# Patient Record
Sex: Female | Born: 1935
Health system: Southern US, Community
[De-identification: ages and names within clinical notes are randomized; demographics above are authoritative.]

## PROBLEM LIST (undated history)

## (undated) DIAGNOSIS — T8859XA Other complications of anesthesia, initial encounter: Secondary | ICD-10-CM

## (undated) DIAGNOSIS — T4145XA Adverse effect of unspecified anesthetic, initial encounter: Secondary | ICD-10-CM

## (undated) DIAGNOSIS — Z9889 Other specified postprocedural states: Secondary | ICD-10-CM

## (undated) DIAGNOSIS — I10 Essential (primary) hypertension: Secondary | ICD-10-CM

## (undated) DIAGNOSIS — J9819 Other pulmonary collapse: Secondary | ICD-10-CM

## (undated) DIAGNOSIS — I209 Angina pectoris, unspecified: Secondary | ICD-10-CM

## (undated) DIAGNOSIS — D869 Sarcoidosis, unspecified: Secondary | ICD-10-CM

## (undated) DIAGNOSIS — E039 Hypothyroidism, unspecified: Secondary | ICD-10-CM

## (undated) DIAGNOSIS — I82409 Acute embolism and thrombosis of unspecified deep veins of unspecified lower extremity: Secondary | ICD-10-CM

## (undated) DIAGNOSIS — I739 Peripheral vascular disease, unspecified: Secondary | ICD-10-CM

## (undated) DIAGNOSIS — S43006A Unspecified dislocation of unspecified shoulder joint, initial encounter: Secondary | ICD-10-CM

## (undated) DIAGNOSIS — M1990 Unspecified osteoarthritis, unspecified site: Secondary | ICD-10-CM

## (undated) DIAGNOSIS — R112 Nausea with vomiting, unspecified: Secondary | ICD-10-CM

## (undated) DIAGNOSIS — F419 Anxiety disorder, unspecified: Secondary | ICD-10-CM

## (undated) DIAGNOSIS — K219 Gastro-esophageal reflux disease without esophagitis: Secondary | ICD-10-CM

## (undated) DIAGNOSIS — D860 Sarcoidosis of lung: Secondary | ICD-10-CM

## (undated) DIAGNOSIS — E7800 Pure hypercholesterolemia, unspecified: Secondary | ICD-10-CM

## (undated) DIAGNOSIS — C801 Malignant (primary) neoplasm, unspecified: Secondary | ICD-10-CM

## (undated) DIAGNOSIS — M47812 Spondylosis without myelopathy or radiculopathy, cervical region: Secondary | ICD-10-CM

## (undated) DIAGNOSIS — R519 Headache, unspecified: Secondary | ICD-10-CM

## (undated) DIAGNOSIS — R51 Headache: Secondary | ICD-10-CM

## (undated) HISTORY — DX: Other pulmonary collapse: J98.19

## (undated) HISTORY — DX: Spondylosis without myelopathy or radiculopathy, cervical region: M47.812

## (undated) HISTORY — DX: Unspecified osteoarthritis, unspecified site: M19.90

## (undated) HISTORY — PX: JOINT REPLACEMENT: SHX530

## (undated) HISTORY — PX: INTRAOCULAR LENS INSERTION: SHX110

## (undated) HISTORY — DX: Unspecified dislocation of unspecified shoulder joint, initial encounter: S43.006A

## (undated) HISTORY — PX: EYE SURGERY: SHX253

## (undated) HISTORY — DX: Acute embolism and thrombosis of unspecified deep veins of unspecified lower extremity: I82.409

## (undated) HISTORY — DX: Essential (primary) hypertension: I10

## (undated) HISTORY — DX: Pure hypercholesterolemia, unspecified: E78.00

---

## 1972-12-09 HISTORY — PX: HEMORRHOID SURGERY: SHX153

## 1998-01-05 ENCOUNTER — Other Ambulatory Visit: Admission: RE | Admit: 1998-01-05 | Discharge: 1998-01-05 | Payer: Self-pay | Admitting: Gynecology

## 1999-01-09 ENCOUNTER — Other Ambulatory Visit: Admission: RE | Admit: 1999-01-09 | Discharge: 1999-01-09 | Payer: Self-pay | Admitting: Gynecology

## 2000-01-28 ENCOUNTER — Other Ambulatory Visit: Admission: RE | Admit: 2000-01-28 | Discharge: 2000-01-28 | Payer: Self-pay | Admitting: Gynecology

## 2001-03-02 ENCOUNTER — Other Ambulatory Visit: Admission: RE | Admit: 2001-03-02 | Discharge: 2001-03-02 | Payer: Self-pay | Admitting: Internal Medicine

## 2002-03-05 ENCOUNTER — Emergency Department (HOSPITAL_COMMUNITY): Admission: EM | Admit: 2002-03-05 | Discharge: 2002-03-05 | Payer: Self-pay | Admitting: Emergency Medicine

## 2002-03-16 ENCOUNTER — Other Ambulatory Visit: Admission: RE | Admit: 2002-03-16 | Discharge: 2002-03-16 | Payer: Self-pay | Admitting: Gynecology

## 2003-03-24 ENCOUNTER — Other Ambulatory Visit: Admission: RE | Admit: 2003-03-24 | Discharge: 2003-03-24 | Payer: Self-pay | Admitting: Gynecology

## 2004-08-11 DIAGNOSIS — I739 Peripheral vascular disease, unspecified: Secondary | ICD-10-CM

## 2004-08-11 DIAGNOSIS — I82409 Acute embolism and thrombosis of unspecified deep veins of unspecified lower extremity: Secondary | ICD-10-CM

## 2004-08-11 HISTORY — DX: Peripheral vascular disease, unspecified: I73.9

## 2004-08-11 HISTORY — DX: Acute embolism and thrombosis of unspecified deep veins of unspecified lower extremity: I82.409

## 2004-10-10 ENCOUNTER — Other Ambulatory Visit: Admission: RE | Admit: 2004-10-10 | Discharge: 2004-10-10 | Payer: Self-pay | Admitting: Gynecology

## 2004-10-23 ENCOUNTER — Encounter: Admission: RE | Admit: 2004-10-23 | Discharge: 2004-11-28 | Payer: Self-pay | Admitting: Internal Medicine

## 2005-06-11 HISTORY — PX: FOOT SURGERY: SHX648

## 2005-07-08 ENCOUNTER — Ambulatory Visit (HOSPITAL_COMMUNITY): Admission: RE | Admit: 2005-07-08 | Discharge: 2005-07-08 | Payer: Self-pay | Admitting: Nurse Practitioner

## 2005-07-08 ENCOUNTER — Ambulatory Visit (HOSPITAL_BASED_OUTPATIENT_CLINIC_OR_DEPARTMENT_OTHER): Admission: RE | Admit: 2005-07-08 | Discharge: 2005-07-08 | Payer: Self-pay | Admitting: Nurse Practitioner

## 2005-07-19 ENCOUNTER — Emergency Department (HOSPITAL_COMMUNITY): Admission: EM | Admit: 2005-07-19 | Discharge: 2005-07-19 | Payer: Self-pay | Admitting: Emergency Medicine

## 2006-01-08 ENCOUNTER — Emergency Department (HOSPITAL_COMMUNITY): Admission: EM | Admit: 2006-01-08 | Discharge: 2006-01-08 | Payer: Self-pay | Admitting: Emergency Medicine

## 2006-01-09 ENCOUNTER — Emergency Department (HOSPITAL_COMMUNITY): Admission: EM | Admit: 2006-01-09 | Discharge: 2006-01-09 | Payer: Self-pay | Admitting: Emergency Medicine

## 2006-01-11 ENCOUNTER — Emergency Department (HOSPITAL_COMMUNITY): Admission: EM | Admit: 2006-01-11 | Discharge: 2006-01-12 | Payer: Self-pay | Admitting: Emergency Medicine

## 2006-01-12 ENCOUNTER — Ambulatory Visit (HOSPITAL_COMMUNITY): Admission: RE | Admit: 2006-01-12 | Discharge: 2006-01-12 | Payer: Self-pay | Admitting: Internal Medicine

## 2007-01-10 HISTORY — PX: SKIN BIOPSY: SHX1

## 2007-03-16 ENCOUNTER — Other Ambulatory Visit: Admission: RE | Admit: 2007-03-16 | Discharge: 2007-03-16 | Payer: Self-pay | Admitting: Gynecology

## 2007-05-14 ENCOUNTER — Encounter: Admission: RE | Admit: 2007-05-14 | Discharge: 2007-05-14 | Payer: Self-pay | Admitting: Orthopedic Surgery

## 2007-06-12 HISTORY — PX: TOTAL HIP ARTHROPLASTY: SHX124

## 2007-06-28 ENCOUNTER — Inpatient Hospital Stay (HOSPITAL_COMMUNITY): Admission: RE | Admit: 2007-06-28 | Discharge: 2007-07-01 | Payer: Self-pay | Admitting: Orthopedic Surgery

## 2007-10-10 HISTORY — PX: TOTAL KNEE ARTHROPLASTY: SHX125

## 2007-10-18 ENCOUNTER — Inpatient Hospital Stay (HOSPITAL_COMMUNITY): Admission: RE | Admit: 2007-10-18 | Discharge: 2007-10-21 | Payer: Self-pay | Admitting: Orthopedic Surgery

## 2008-04-25 ENCOUNTER — Other Ambulatory Visit: Admission: RE | Admit: 2008-04-25 | Discharge: 2008-04-25 | Payer: Self-pay | Admitting: Gynecology

## 2008-04-25 ENCOUNTER — Ambulatory Visit: Payer: Self-pay | Admitting: Gynecology

## 2008-04-25 ENCOUNTER — Encounter: Payer: Self-pay | Admitting: Gynecology

## 2008-04-27 ENCOUNTER — Ambulatory Visit: Payer: Self-pay | Admitting: Gynecology

## 2008-07-11 ENCOUNTER — Emergency Department (HOSPITAL_COMMUNITY): Admission: EM | Admit: 2008-07-11 | Discharge: 2008-07-11 | Payer: Self-pay | Admitting: Emergency Medicine

## 2008-07-28 ENCOUNTER — Encounter: Payer: Self-pay | Admitting: Cardiovascular Disease

## 2009-02-05 ENCOUNTER — Ambulatory Visit: Payer: Self-pay | Admitting: Internal Medicine

## 2009-02-13 ENCOUNTER — Telehealth: Payer: Self-pay | Admitting: Internal Medicine

## 2009-02-19 ENCOUNTER — Ambulatory Visit: Payer: Self-pay | Admitting: Internal Medicine

## 2009-05-08 ENCOUNTER — Ambulatory Visit: Payer: Self-pay | Admitting: Gynecology

## 2009-05-08 ENCOUNTER — Encounter: Payer: Self-pay | Admitting: Gynecology

## 2009-05-08 ENCOUNTER — Other Ambulatory Visit: Admission: RE | Admit: 2009-05-08 | Discharge: 2009-05-08 | Payer: Self-pay | Admitting: Gynecology

## 2009-05-11 ENCOUNTER — Ambulatory Visit: Payer: Self-pay | Admitting: Gynecology

## 2009-07-02 DIAGNOSIS — M1990 Unspecified osteoarthritis, unspecified site: Secondary | ICD-10-CM | POA: Insufficient documentation

## 2010-05-19 ENCOUNTER — Emergency Department (HOSPITAL_COMMUNITY)
Admission: EM | Admit: 2010-05-19 | Discharge: 2010-05-19 | Payer: Self-pay | Source: Home / Self Care | Admitting: Emergency Medicine

## 2010-07-11 DIAGNOSIS — J9819 Other pulmonary collapse: Secondary | ICD-10-CM

## 2010-07-11 HISTORY — DX: Other pulmonary collapse: J98.19

## 2010-07-19 ENCOUNTER — Ambulatory Visit (HOSPITAL_COMMUNITY)
Admission: RE | Admit: 2010-07-19 | Discharge: 2010-07-19 | Payer: Self-pay | Source: Home / Self Care | Attending: Orthopedic Surgery | Admitting: Orthopedic Surgery

## 2010-07-23 ENCOUNTER — Encounter
Admission: RE | Admit: 2010-07-23 | Discharge: 2010-07-23 | Payer: Self-pay | Source: Home / Self Care | Attending: Orthopedic Surgery | Admitting: Orthopedic Surgery

## 2010-07-29 ENCOUNTER — Ambulatory Visit: Payer: Self-pay | Admitting: Internal Medicine

## 2010-07-30 ENCOUNTER — Ambulatory Visit: Payer: Self-pay | Admitting: Internal Medicine

## 2010-07-30 ENCOUNTER — Telehealth: Payer: Self-pay | Admitting: Internal Medicine

## 2010-07-30 DIAGNOSIS — I10 Essential (primary) hypertension: Secondary | ICD-10-CM

## 2010-07-30 DIAGNOSIS — J984 Other disorders of lung: Secondary | ICD-10-CM

## 2010-07-30 DIAGNOSIS — E785 Hyperlipidemia, unspecified: Secondary | ICD-10-CM

## 2010-07-30 DIAGNOSIS — R05 Cough: Secondary | ICD-10-CM

## 2010-07-30 DIAGNOSIS — J9819 Other pulmonary collapse: Secondary | ICD-10-CM | POA: Insufficient documentation

## 2010-07-30 DIAGNOSIS — R059 Cough, unspecified: Secondary | ICD-10-CM

## 2010-07-30 DIAGNOSIS — I80299 Phlebitis and thrombophlebitis of other deep vessels of unspecified lower extremity: Secondary | ICD-10-CM | POA: Insufficient documentation

## 2010-07-30 HISTORY — DX: Other pulmonary collapse: J98.19

## 2010-07-30 HISTORY — DX: Hyperlipidemia, unspecified: E78.5

## 2010-07-30 HISTORY — DX: Cough, unspecified: R05.9

## 2010-07-30 HISTORY — DX: Phlebitis and thrombophlebitis of other deep vessels of unspecified lower extremity: I80.299

## 2010-07-30 HISTORY — DX: Other disorders of lung: J98.4

## 2010-07-30 HISTORY — DX: Essential (primary) hypertension: I10

## 2010-07-31 ENCOUNTER — Telehealth: Payer: Self-pay | Admitting: Internal Medicine

## 2010-08-01 ENCOUNTER — Ambulatory Visit (HOSPITAL_COMMUNITY)
Admission: RE | Admit: 2010-08-01 | Discharge: 2010-08-01 | Payer: Self-pay | Source: Home / Self Care | Attending: Internal Medicine | Admitting: Internal Medicine

## 2010-08-06 ENCOUNTER — Telehealth (INDEPENDENT_AMBULATORY_CARE_PROVIDER_SITE_OTHER): Payer: Self-pay | Admitting: *Deleted

## 2010-08-14 ENCOUNTER — Telehealth (INDEPENDENT_AMBULATORY_CARE_PROVIDER_SITE_OTHER): Payer: Self-pay | Admitting: *Deleted

## 2010-09-04 ENCOUNTER — Ambulatory Visit
Admission: RE | Admit: 2010-09-04 | Discharge: 2010-09-04 | Payer: Self-pay | Source: Home / Self Care | Attending: Internal Medicine | Admitting: Internal Medicine

## 2010-09-06 ENCOUNTER — Telehealth: Payer: Self-pay | Admitting: Internal Medicine

## 2010-09-06 ENCOUNTER — Telehealth (INDEPENDENT_AMBULATORY_CARE_PROVIDER_SITE_OTHER): Payer: Self-pay | Admitting: *Deleted

## 2010-09-12 ENCOUNTER — Telehealth (INDEPENDENT_AMBULATORY_CARE_PROVIDER_SITE_OTHER): Payer: Self-pay | Admitting: *Deleted

## 2010-09-12 NOTE — Progress Notes (Signed)
Summary: uncontrolable cough and results from bronoscopy  Phone Note Call from Patient Call back at Home Phone [phone]   Caller: Patient Call For: Dr. Marchelle Gearing Summary of Call: Patient stated that she had a Bronoscopy Thursday afternoon and she has alot of uncontrolable coughing especailly at night since she had the Bronocsopy. Patient would like to know if she needs to be seen by someone. Her cough is not productive. She can be reached at [phone] Patient also would like the results of her pathology from the procedure Initial call taken by: Vedia Coffer,  August 06, 2010 10:44 AM  Follow-up for Phone Call        Spoke with pt and she states since bronch she has had some wheezing and increased dry cough. SHe is also hoarse. She states the cough is much worse at night. Pt has denied any SOB. Please advise. Carron Curie CMA  August 06, 2010 2:39 PM   Additional Follow-up for Phone Call Additional follow up Details #1::        Spoke with patient-she is aware that CDY suggest she follow up with MR since having bronch-will forward message to [facility] and MR to discuss this; also patient aware we are sending Tessalon pearls 100mg  #20 take 1-2 three times a day as needed cough no refills per CDY.Reynaldo Minium CMA  August 06, 2010 4:29 PM     Additional Follow-up for Phone Call Additional follow up Details #2::    [place] f[place]. Explained results show acute inflammation (neutrophils in BAL) and entrance to middle lobe is very narrowed. Micro is negative. Recommend a) cephalexing 300mg  by mouth two times a day x 7 days, and b) prednisone 40mg  by mouth daily x 5 days, then 20mg  daily x 5 days, then 10mg  x 5 days, then 5mg  daily x 5 days and sthen stop; c) ROV with me in 4 weeks. She should call sooner if cough is getting worse or not responding to above. Also explained if above not helping surgical option to be considered.   TRiage:I would appreciate it if you  could call in the meds and set up followup and inform patinet med has been called   Follow-up by: [name] MD,  August 07, 2010 8:37 AM  Additional Follow-up for Phone Call Additional follow up Details #3:: Details for Additional Follow-up Action Taken: [facility] Vernie Murders  August 07, 2010 8:54 AM  Spoke with pt and advised that we will call in the above meds to [facility] [address].  Pt verbalized understanding.  4 wk rov sched for 09/04/10 at 1:30 pm. Additional Follow-up by: Vernie Murders,  August 07, 2010 9:07 AM  New/Updated Medications: TESSALON PERLES 100 MG CAPS (BENZONATATE) take 1-2 by mouth three times a day as needed cough CEPHALEXIN 250 MG CAPS (CEPHALEXIN) 1 two times a day until gone PREDNISONE 10 MG TABS (PREDNISONE) 4 each am x 5 days, 2 each am x 5 days, 1 each am x 5 days, then 1/2 each am x 5 days- then stop Prescriptions: PREDNISONE 10 MG TABS (PREDNISONE) 4 each am x 5 days, 2 each am x 5 days, 1 each am x 5 days, then 1/2 each am x 5 days- then stop  #34 x 0   Entered by:   Vernie Murders   Authorized by:   [name] MD   Signed by:   Vernie Murders on 08/07/2010   Method used:   [facility] to  [facility]  [phone][facility] (retail)       [address] [place], [place]  [address]       Ph: [phone] or [phone]       Fax: [phone]   RxID:   [id number] CEPHALEXIN 250 MG CAPS (CEPHALEXIN) 1 two times a day until gone  #14 x 0   Entered by:   Vernie Murders   Authorized by:   [name] MD   Signed by:   Vernie Murders on 08/07/2010   Method used:   Electronically to        [facility]  [phone]* (retail)       [address] [place], [place]  [address]       Ph: [phone] or [phone]       Fax: [phone]   RxID:   [id number] TESSALON PERLES 100 MG CAPS (BENZONATATE) take 1-2 by mouth three times a day as needed cough  #20 x 0   Entered by:   Reynaldo Minium CMA   Authorized by:   Waymon Budge MD   Signed by:   Reynaldo Minium CMA on 08/06/2010   Method used:   Electronically to        [facility]  [phone]* (retail)       [address] [place], [place]  [address]       Ph: [phone] or [phone]       Fax: [phone]   RxID:   [id number]

## 2010-09-12 NOTE — Progress Notes (Signed)
Summary: xray results - duplicate note  Phone Note Call from Patient Call back at Home Phone [phone]   Caller: Patient Call For: ramaswamy Reason for Call: Lab or Test Results Summary of Call: Requests results of her cxr. Initial call taken by: Darletta Moll,  September 06, 2010 2:49 PM  Follow-up for Phone Call        duplicate phone message taken on this.  Pls see phone note dated for earlier today regarding this.   Follow-up by: Gweneth Dimitri RN,  September 06, 2010 4:13 PM

## 2010-09-12 NOTE — Progress Notes (Signed)
Summary: Benicar 20mg  samples  Phone Note Call from Patient Call back at Home Phone [phone]   Caller: Patient Call For: Dr. Marchelle Gearing Summary of Call: patient phoned stated that Dr. Marchelle Gearing changed her BP medication to Benicar 20 MG she only has 8 tablets left and doesn't see him for 21 days. She is doing well with this medication and would like samples to last until her appointment or [facility] [address]. Patient can be reached [phone] Patient is going to take her walk will return in about one hour if she is out you can leave a message Initial call taken by: Vedia Coffer,  August 14, 2010 9:14 AM  Follow-up for Phone Call        3 boxes of Benicar 20mg  ready for patient to pick up. Pt aware.Michel Bickers CMA  August 14, 2010 11:03 AM

## 2010-09-12 NOTE — Progress Notes (Addendum)
Summary: results -- PT CALLED AGAIN  Phone Note Call from Patient Call back at Home Phone [phone]   Caller: Patient Call For: Deidre Carino Reason for Call: Lab or Test Results Summary of Call: Patient requesting xray results. Initial call taken by: Lehman Prom,  September 06, 2010 9:25 AM  Follow-up for Phone Call        pt requesting cxr results from 09/04/2010. They are unsigned.  Please advise. Aundra Millet Reynolds LPN  September 06, 2010 10:27 AM   MR, pt has called back again requesting these results.  Pls advise.  Thanks!  Additional Follow-up for Phone Call Additional follow up Details #1::        a) gave results. explained collapse likely to persist and cause recurrent pna. Options are to a) expectant fu; b) enquire with [facility] if they can stent it and get 2nd opinion; c) lobectomy. SHe is interested in B and I ahve written to Dr Carmie Kanner at [facility]. Depending on that I might send her to general pulmonary for 2nd opinion  b regarding BP she is started losartan-hctz. SHe is ok with it but want to try ACE inhibitor. I said ok if she wants to go back on ACEand see what happens to cough Additional Follow-up by: [name] MD,  September 06, 2010 5:49 PM     Appended Document: results -- PT CALLED AGAIN I d/w Dr. Waldon Merl [facility] interventional program by email. He thinks dilatation might help. He is willing to see patient and discuss. D/w patient and husband and they want to meet with him to see what options are. Candise Bowens, pls arrange for referral but contacting hiis nurse/coordinator - Ardelia Mems to give her the information. Her office number is: [phone].  pls give fu with me 1-2 months after she sees him Pls let me knwo what date she sees him  Thanks MR  Appended Document: [facility] referral order    Clinical Lists Changes  Orders: Added new Referral order of Misc. Referral (Misc. Ref) - Signed

## 2010-09-12 NOTE — Progress Notes (Signed)
Summary: bronch 08/01/2010  Phone Note Outgoing Call   Summary of Call: spoke to patient. CXR May 2010 was clear. CXR May 2011 shows mild RML plate like atelectasis. This is worse now on recent CXR. She needs bronch. I d/w her on phone. Explained risks of bronch - bleeidng, ptx, sedation complicatina nd non diagnosis. BEnefit of finding diagnosis potentially explauiend. She agrees. Bronch 08/01/2010 at 13:00h at [facility] scheduled. Plan for airway exam, RML lavage, brush bx +/- endobronchial bx Initial call taken by: [name] MD,  July 31, 2010 1:20 PM

## 2010-09-12 NOTE — Assessment & Plan Note (Signed)
Summary: LUNG NODULES///KP   Visit Type:  Initial Consult Copy to:  Dr. Lequita Halt Primary Nile Prisk/Referring Damany Eastman:  Dr. Geoffry Paradise, Dr. Marchelle Folks, Dr. Christel Mormon, Dr. Lonia Skinner, Dr.,Philip Nasher-cards  CC:  Pulmonary Consult for abnormal CT. Marland Kitchen  History of Present Illness: July 30, 2010: 77 year old white female who is generall active at gym and exercises. Referred by Dr. Despina Hick for abnormal imaging. She is s/p left hip replacement inNov 2009 but currently needs bursectomy versus ligament repair. So, she has preop CXR 07/19/2010 which showed RML atelectasis with associated soft tisse fullness. REsulted in CT chest 07/23/2010 which showed RML partial collapse with surrounding fullness, 7mm Left apical nodule image 11, LUL 6mm nodule image 10, soft tissue fullness at right hilum and borderline hilar, subcarinal nodes. Ther is one calcified nodule  In terms of chest symptoms: In April 2011 she went to [place]. ST[place] on way back picked up a "nasty cold" in plane. Rx with 2 rounds of prednisone and 1 round of abx and it cleared up. In June 2011 she had routine cxr at Dr. Jacky Kindle office - she believes it might have had some abnormality NOS. Then, i In first part of Nov 2011 was in [place]. Soon after return on 06/27/2010 she noticed dry irritating cough that has persisted. Cough present only at day time. Relieved by throat lozenges. Denies associated  sinus drainage but admits to ACE inhibitor intake since 2007. Also admits to mild GERD for which she is take occassional zantac (gerd made worse by chocolates and spices). Denies dyspnea, wheezing, sputum, fever, chills, weight loss. In terms of travel, she was born in [place]. Spent lot of time of [place] and [place]. Moved to [place] 20 years ago.    Preventive Screening-Counseling & Management  Alcohol-Tobacco     Smoking Status: never     Passive Smoke Exposure: yes  Comments: passive smoke - birth to age 77 via  dad  Current Medications (verified): 1)  Gemfibrozil 600 Mg Tabs (Gemfibrozil) .... Take 1 Tablet By Mouth Two Times A Day 2)  Lotrel 10-20 Mg Caps (Amlodipine Besy-Benazepril Hcl) .... Take 1 Tablet By Mouth Once A Day 3)  Advil 200 Mg Tabs (Ibuprofen) .... As Needed 4)  Calcium 600 Mg Tabs (Calcium) .... 2 Tablets Daily 5)  Multivitamins  Tabs (Multiple Vitamin) .... Take 1 Tablet By Mouth Once A Day 6)  Aspir-Low 81 Mg Tbec (Aspirin) .... Take 1 Tablet By Mouth Once A Day 7)  Vitamin D 1000 Unit Tabs (Cholecalciferol) .... Take 1 Tablet By Mouth Once A Day  Allergies (verified): No Known Drug Allergies  Past History:  Past Medical History: Hyperlipidemia Hypertension Deep Vein Thrombosis/Phlebitis, 61yrs ago with birth of son, and again in  2006  - 1966 "calf of left leg"  following labor in [place], [place] in hospital extra week. No Rx.   - 2006 was "deep vein" followng foot surgery - took coumadin for 6 months  - not on life long anticoagulation Left hip pain  - repeated cortisone shots  Stress Test 12-09-Dr. Melburn Popper normal  Past Surgical History: Total left hip replacement 11-08,  total Knee, right replacement 3-09 Lens replacement-both 05-2005 Hemorrhoidectomy-12-1972 Left foot surgery -06-2005 Basal cell carcinoma biopsied and removed, RL leg 01-2007  Family History: Mother-heart disease Father-heart disease  Social History: Patient never smoked.  Married Retired Curator at [employer] One beer dailySmoking Status:  never Passive Smoke Exposure:  yes  Review of Systems  The patient complains of non-productive cough and headaches.  The patient denies shortness of breath with activity, shortness of breath at rest, productive cough, coughing up blood, chest pain, irregular heartbeats, acid heartburn, indigestion, loss of appetite, weight change, abdominal pain, difficulty swallowing, sore throat, tooth/dental problems, nasal congestion/difficulty  breathing through nose, sneezing, itching, ear ache, anxiety, depression, hand/feet swelling, joint stiffness or pain, rash, change in color of mucus, and fever.    Vital Signs:  Patient profile:   77 year old female Height:      64 inches Weight:      150.38 pounds BMI:     25.91 O2 Sat:      99 % on Room air Temp:     98.0 degrees F oral Pulse rate:   73 / minute BP sitting:   128 / 72  (right arm) Cuff size:   regular  Vitals Entered By: Carron Curie CMA (July 30, 2010 9:14 AM)  O2 Flow:  Room air CC: Pulmonary Consult for abnormal CT.  Comments Medications reviewed with patient Carron Curie CMA  July 30, 2010 9:16 AM Daytime phone number verified with patient.    Physical Exam  General:  well developed, well nourished, in no acute distress Head:  normocephalic and atraumatic Eyes:  PERRLA/EOM intact; conjunctiva and sclera clear Ears:  TMs intact and clear with normal canals Nose:  no deformity, discharge, inflammation, or lesions Mouth:  no deformity or lesions Neck:  no masses, thyromegaly, or abnormal cervical nodes Chest Wall:  no deformities noted Lungs:  clear bilaterally to auscultation and percussion Heart:  regular rate and rhythm, S1, S2 without murmurs, rubs, gallops, or clicks Abdomen:  bowel sounds positive; abdomen soft and non-tender without masses, or organomegaly Msk:  no deformity or scoliosis noted with normal posture Pulses:  pulses normal Extremities:  no clubbing, cyanosis, edema, or deformity noted Neurologic:  CN II-XII grossly intact with normal reflexes, coordination, muscle strength and tone Skin:  intact without lesions or rashes Cervical Nodes:  no significant adenopathy Axillary Nodes:  no significant adenopathy Psych:  alert and cooperative; normal mood and affect; normal attention span and concentration   CT of Chest  Procedure date:  07/23/2010  Findings:       CT chest 07/23/2010 which showed RML partial  collapse with surrounding fullness, 7mm Left apical nodule image 11, LUL 6mm nodule image 10, soft tissue fullness at right hilum and borderline hilar, subcarinal nodes. Ther is one calcified nodule  Comments:      independently reviewed  Impression & Recommendations:  Problem # 1:  PULMONARY COLLAPSE (ICD-518.0) Assessment New  There is RML collpase on CXR and CT Dec 2011. Unclear if this is new or chronic. If new, likely related to mucus plug/pna from [place] trip in April 2011 with associated nodal enlargement. IF old, suspect remote foreign body aspiration, MAC, RML collapse syndrome. Have instructed her to get her old hard copy cxr from PMD office. I wil reivew those. If indeed these are new, will get PET scan due to descripton of fullness around RML entrance. She will then need Bronch +/- EBUS. Hold ortho surgery till then  Orders: Consultation Level V [phone])  Problem # 2:  PULMONARY NODULE (ICD-518.89) Assessment: New  These are less than 8mm and likely benign due to nonsmoking and living in [place].  plan needs repeat ct chest 6-9 months indepentn of collapse issue  Orders: Consultation Level V ([id number])  Problem # 3:  COUGH (ICD-786.2) Assessment: New  probably related to mid-lobe collapse but GERD and ACE inhibitor could be weighing. Will address these at followup dependingon course of RML collapse workup  Orders: Consultation Level V ([id number])  Problem # 4:  Hx of DEEP VEIN THROMBOSIS/PHLEBITIS (ICD-451.19) Assessment: New  Medications Added to Medication List This Visit: 1)  Gemfibrozil 600 Mg Tabs (Gemfibrozil) .... Take 1 tablet by mouth two times a day 2)  Lotrel 10-20 Mg Caps (Amlodipine besy-benazepril hcl) .... Take 1 tablet by mouth once a day 3)  Advil 200 Mg Tabs (Ibuprofen) .... As needed 4)  Calcium 600 Mg Tabs (Calcium) .... 2 tablets daily 5)  Multivitamins Tabs (Multiple vitamin) .... Take 1 tablet by mouth once a day 6)  Aspir-low 81 Mg Tbec (Aspirin)  .... Take 1 tablet by mouth once a day 7)  Vitamin D 1000 Unit Tabs (Cholecalciferol) .... Take 1 tablet by mouth once a day  Patient Instructions: 1)  please get your old chest xray from DR Jacky Kindle office 2)   - going back several years atleast including all ones from 2011 3)  based on that you might need PET scan or bronchoscopy 4)  the possible causes include 5)   - mucus plug after visit to [place] 6)   - lymph gland enlargement around the area 7)   - remote foregin body aspiration 8)   - MAC infection in your age group 97)   - unknown causes 10)   - rarely cancer   Immunization History:  Influenza Immunization History:    Influenza:  historical (04/15/2010)  Pneumovax Immunization History:    Pneumovax:  historical (05/13/2004)  Tetanus/Td Immunization History:    Tetanus/Td:  historical (02/08/2002)  Zostavax History:    Zostavax # 1:  zostavax (06/12/2007)

## 2010-09-12 NOTE — Progress Notes (Signed)
Summary: nos appt  Phone Note Call from Patient   Caller: [email or web address]  Call For: wert Summary of Call: In ref to nos from 12/19, pt is seeing Dr. Marchelle Gearing today. Initial call taken by: Darletta Moll,  July 30, 2010 9:44 AM

## 2010-09-12 NOTE — Assessment & Plan Note (Signed)
Summary: followup//lmr   Visit Type:  Follow-up Copy to:  Dr. Lequita Halt Primary Provider/Referring Provider:  Dr. Geoffry Paradise, Dr. Marchelle Folks, Dr. Christel Mormon, Dr. Lonia Skinner, Dr.,Philip Nasher-cards  CC:  Follow-up. Pt c/o dry cough. .  History of Present Illness: July 30, 2010: 77 year old white female who is generall active at gym and exercises. Referred by Dr. Despina Hick for abnormal imaging. She is s/p left hip replacement inNov 2009 but currently needs bursectomy versus ligament repair. So, she has preop CXR 07/19/2010 which showed RML atelectasis with associated soft tisse fullness. REsulted in CT chest 07/23/2010 which showed RML partial collapse with surrounding fullness, 7mm Left apical nodule image 11, LUL 6mm nodule image 10, soft tissue fullness at right hilum and borderline hilar, subcarinal nodes. Ther is one calcified nodule  In terms of chest symptoms: In April 2011 she went to [place]. STates on way back picked up a "nasty cold" in plane. Rx with 2 rounds of prednisone and 1 round of abx and it cleared up. In June 2011 she had routine cxr at Dr. Jacky Kindle office - she believes it might have had some abnormality NOS. Then, i In first part of Nov 2011 was in [place]. Soon after return on 06/27/2010 she noticed dry irritating cough that has persisted. Cough present only at day time. Relieved by throat lozenges. Denies associated  sinus drainage but admits to ACE inhibitor intake since 2007. Also admits to mild GERD for which she is take occassional zantac (gerd made worse by chocolates and spices). Denies dyspnea, wheezing, sputum, fever, chills, weight loss. In terms of travel, she was born in [place]. Spent lot of time of [place] and [place]. Moved to [place] 20 years ago. RECL BRONCH and CHANGE ACE INHIBITOR TO BENICAR   Aug 01, 2010: BRONCH  - Narrowed  RML orifice. No endobronhcial lesion. Lavage - neutrophils. Endobrhnical bx and micro - non-diagnostic. REC: 3 week  prednisone and 1 week ABX   September 04, 2010: Followup RML collapse. AFter bronch cough worsenend. Advised 7 days abx and  3 week prednisone. COmpleted prednisone 1 week ago. AT that point cough resolved "almost completely". Was feeling great. This past weekend, grandkids sick and now she has runny nose, sneezing, fatigue and some dry cough       Preventive Screening-Counseling & Management  Alcohol-Tobacco     Smoking Status: never     Passive Smoke Exposure: yes  Current Medications (verified): 1)  Gemfibrozil 600 Mg Tabs (Gemfibrozil) .... Take 1 Tablet By Mouth Two Times A Day 2)  Benicar 20 Mg Tabs (Olmesartan Medoxomil) .... Take 1 Tablet By Mouth Once A Day 3)  Advil 200 Mg Tabs (Ibuprofen) .... As Needed 4)  Calcium 600 Mg Tabs (Calcium) .... 2 Tablets Daily 5)  Multivitamins  Tabs (Multiple Vitamin) .... Take 1 Tablet By Mouth Once A Day 6)  Aspir-Low 81 Mg Tbec (Aspirin) .... Take 1 Tablet By Mouth Once A Day 7)  Vitamin D 1000 Unit Tabs (Cholecalciferol) .... Take 1 Tablet By Mouth Once A Day 8)  Tessalon Perles 100 Mg Caps (Benzonatate) .... Take 1-2 By Mouth Three Times A Day As Needed Cough  Allergies (verified): No Known Drug Allergies  Past History:  Past medical, surgical, family and social histories (including risk factors) reviewed, and no changes noted (except as noted below).  Past Medical History: Reviewed history from 07/30/2010 and no changes required. Hyperlipidemia Hypertension Deep Vein Thrombosis/Phlebitis, 87yrs ago with birth of son, and again in  2006  - 1966 "calf of left leg"  following labor in [place], [place] in hospital extra week. No Rx.   - 2006 was "deep vein" followng foot surgery - took coumadin for 6 months  - not on life long anticoagulation Left hip pain  - repeated cortisone shots  Stress Test 12-09-Dr. Melburn Popper normal  Past Surgical History: Reviewed history from 07/30/2010 and no changes required. Total left hip  replacement 11-08,  total Knee, right replacement 3-09 Lens replacement-both 05-2005 Hemorrhoidectomy-12-1972 Left foot surgery -06-2005 Basal cell carcinoma biopsied and removed, RL leg 01-2007  Family History: Reviewed history from 07/30/2010 and no changes required. Mother-heart disease Father-heart disease  Social History: Reviewed history from 07/30/2010 and no changes required. Patient never smoked.  Married Retired Curator at [employer] One beer daily  Review of Systems       The patient complains of non-productive cough.  The patient denies shortness of breath with activity, shortness of breath at rest, productive cough, coughing up blood, chest pain, irregular heartbeats, acid heartburn, indigestion, loss of appetite, weight change, abdominal pain, difficulty swallowing, sore throat, tooth/dental problems, headaches, nasal congestion/difficulty breathing through nose, sneezing, itching, ear ache, anxiety, depression, hand/feet swelling, joint stiffness or pain, rash, change in color of mucus, and fever.    Vital Signs:  Patient profile:   77 year old female Height:      64 inches Weight:      150.50 pounds BMI:     25.93 O2 Sat:      99 % on Room air Temp:     98.3 degrees F oral Pulse rate:   72 / minute BP sitting:   122 / 78  (right arm) Cuff size:   regular  Vitals Entered By: Carron Curie CMA (September 04, 2010 1:48 PM)  O2 Flow:  Room air CC: Follow-up. Pt c/o dry cough.  Comments Medications reviewed with patient Carron Curie CMA  September 04, 2010 1:48 PM Daytime phone number verified with patient.    Physical Exam  General:  well developed, well nourished, in no acute distress Head:  normocephalic and atraumatic Eyes:  PERRLA/EOM intact; conjunctiva and sclera clear Ears:  TMs intact and clear with normal canals Nose:  no deformity, discharge, inflammation, or lesions Mouth:  no deformity or lesions Neck:  no masses, thyromegaly, or  abnormal cervical nodes Chest Wall:  no deformities noted Lungs:  clear bilaterally to auscultation and percussion Heart:  regular rate and rhythm, S1, S2 without murmurs, rubs, gallops, or clicks Abdomen:  bowel sounds positive; abdomen soft and non-tender without masses, or organomegaly Msk:  no deformity or scoliosis noted with normal posture Pulses:  pulses normal Extremities:  no clubbing, cyanosis, edema, or deformity noted Neurologic:  CN II-XII grossly intact with normal reflexes, coordination, muscle strength and tone Skin:  intact without lesions or rashes Cervical Nodes:  no significant adenopathy Axillary Nodes:  no significant adenopathy Psych:  alert and cooperative; normal mood and affect; normal attention span and concentration   Impression & Recommendations:  Problem # 1:  PULMONARY NODULE (ICD-518.89) Assessment Unchanged  These are less than 8mm and likely benign due to nonsmoking and living in [place].  plan needs repeat ct chest 6-9 which will bein Fall 2012  independent of collapse issue  Orders: Est. Patient Level III (81191) Prescription Created Electronically [phone])  Problem # 2:  PULMONARY COLLAPSE (ICD-518.0) Assessment: Improved  Orders: T-2 View CXR (71020TC) Est. Patient Level III (56213)  There  is RML collpase on CXR and CT Dec 2011.This is ne compared to old films from [facility]D office. Bronch 08/01/2010 showed neutrophils but no endobronchial lesion. s/p 3 week prednisone with 1 week abx with near resolution of symptoms. Some cold now with related cough. Will get CXR. If cxr shows persistent collpase recommended surgical referral for lobectomy but they prefer watching  Orders: Consultation Level V (04540)  Problem # 3:  HYPERTENSION (ICD-401.9) Assessment: Unchanged  She feels cough is beter controlled with ARB than ACE. I have sent 1 year script for Benicar and losartan. She will take whichever is cheaper. She will get further refills from  PMD Her updated medication list for this problem includes:    Benicar 20 Mg Tabs (Olmesartan medoxomil) .Marland Kitchen... Take 1 tablet by mouth once a day    Losartan Potassium-hctz 50-12.5 Mg Tabs (Losartan potassium-hctz) .Marland Kitchen... Take 1 tablet daily  Orders: Est. Patient Level III (98119) Prescription Created Electronically [phone])  Medications Added to Medication List This Visit: 1)  Benicar 20 Mg Tabs (Olmesartan medoxomil) .... Take 1 tablet by mouth once a day 2)  Losartan Potassium-hctz 50-12.5 Mg Tabs (Losartan potassium-hctz) .... Take 1 tablet daily  Patient Instructions: 1)  have cxr today 2)  will call you wiht results 3)  return in 4-6 mnths for followup 4)  come sooner or call if any problems 5)  i have sent one year of benicar and cozaar - choose anyone you like Prescriptions: BENICAR 20 MG TABS (OLMESARTAN MEDOXOMIL) Take 1 tablet by mouth once a day  #90 x 4   Entered and Authorized by:   [name] MD   Signed by:   [name] MD on 09/04/2010   Method used:   Electronically to        [facility]  [phone]* (retail)       [address] [place], [place]  [address]       Ph: [phone] or [phone]       Fax: [phone]   RxID:   [id number] LOSARTAN POTASSIUM-HCTZ 50-12.5 MG TABS (LOSARTAN POTASSIUM-HCTZ) take 1 tablet daily  #90 x 4   Entered and Authorized by:   [name] MD   Signed by:   [name] MD on 09/04/2010   Method used:   Electronically to        [facility]  [phone]* (retail)       [address] [place], [place]  [address]       Ph: [phone] or [phone]       Fax: [phone]   RxID:   [id number]

## 2010-09-24 ENCOUNTER — Telehealth: Payer: Self-pay | Admitting: Internal Medicine

## 2010-09-26 NOTE — Progress Notes (Signed)
Summary: [facility] referral  Phone Note Call from Patient Call back at Home Phone [phone]   Caller: Patient Call For: ramaswamy Summary of Call: pt wants to know if anything has been set up at [facility] yet. pt says she was to decide between dialation vs surgery to remove lobe in lung. ALSO: wants MR's recs re: surgeon who does laposcopic "robo" surgery.  Initial call taken by: Tivis Ringer, CNA,  September 12, 2010 3:54 PM  Follow-up for Phone Call        Spoke with pt.  I advised that we have started the process for the refferal to [facility]- records and referal were faxed to Huntley Dec at [facility] on 09/10/10 and she is to contact pt with appt.  Pt verbalized understanding.  She also wanted to know if MR thinks that she needs to see a thoracid surgeon also.  Pt aware MR out of the office until 09/16/10. Pls advise thanks Follow-up by: Vernie Murders,  September 12, 2010 4:51 PM  Additional Follow-up for Phone Call Additional follow up Details #1::        She is seeing Dr Carmie Kanner 10/01/2010 at [facility] at 13:30  Thoracic Surgeon is Maisie Fus A. Ewing Schlein, MD  Appointment Telephone: [phone]  Office Telephone: [phone]  Fax [phone]   Please give her appt to see him as well to discuss options. Ideal if she sees both Dr. Carmie Kanner in [facility] and the surgeon on same day but she said driving 2 times is not an issue     Additional Follow-up by: [name] MD,  September 16, 2010 12:23 PM    Additional Follow-up for Phone Call Additional follow up Details #2::    Order was sent to [facility] with the above information to make the referal.  Follow-up by: Vernie Murders,  September 16, 2010 12:29 PM

## 2010-10-02 NOTE — Progress Notes (Signed)
Summary: Request for Path slides be sent to [facility]  Phone Note From Other Clinic   Summary of Call: I received a form from Dr. Denyse Dago  requesting pathology slides be sent to [facility] Attn: Craig Guess, Room 3589, [address] [place], [place] [address]. I called [facility] PATHOLOG[facility] @ [phone] AND REQUESTED THIS BE SENT to address given. [facility] fedex # is [id number]. [name] Page is aware these have been ordered.  her contact # is [phone] Initial call taken by: Carron Curie CMA,  September 24, 2010 3:39 PM

## 2010-10-09 ENCOUNTER — Encounter: Payer: Self-pay | Admitting: Internal Medicine

## 2010-10-21 LAB — BODY FLUID CELL COUNT WITH DIFFERENTIAL
Lymphs, Fluid: 21 %
Neutrophil Count, Fluid: 75 % — ABNORMAL HIGH (ref 0–25)
Total Nucleated Cell Count, Fluid: 81 cu mm (ref 0–1000)

## 2010-10-21 LAB — CULTURE, RESPIRATORY W GRAM STAIN

## 2010-10-21 LAB — AFB STAIN

## 2010-10-21 LAB — LEGIONELLA PROFILE(CULTURE+DFA/SMEAR)

## 2010-10-21 LAB — FUNGUS CULTURE W SMEAR

## 2010-10-21 LAB — AFB CULTURE WITH SMEAR (NOT AT ARMC): Acid Fast Smear: NONE SEEN

## 2010-10-22 LAB — COMPREHENSIVE METABOLIC PANEL
AST: 22 U/L (ref 0–37)
Alkaline Phosphatase: 67 U/L (ref 39–117)
CO2: 26 mEq/L (ref 19–32)
Chloride: 104 mEq/L (ref 96–112)
Creatinine, Ser: 0.9 mg/dL (ref 0.4–1.2)
GFR calc Af Amer: >60 mL/min (ref >60)
GFR calc non Af Amer: >60 mL/min (ref >60)
Potassium: 4.9 mEq/L (ref 3.5–5.1)
Total Bilirubin: 0.5 mg/dL (ref 0.3–1.2)

## 2010-10-22 LAB — CBC
Hemoglobin: 13.3 g/dL (ref 12.0–15.0)
MCH: 32.2 pg (ref 26.0–34.0)
MCV: 94.4 fL (ref 78.0–100.0)
RBC: 4.13 MIL/uL (ref 3.87–5.11)
WBC: 4.8 10*3/uL (ref 4.0–10.5)

## 2010-10-22 LAB — URINALYSIS, ROUTINE W REFLEX MICROSCOPIC
Bilirubin Urine: NEGATIVE
Glucose, UA: NEGATIVE mg/dL
Hgb urine dipstick: NEGATIVE
Ketones, ur: NEGATIVE mg/dL
Nitrite: NEGATIVE
Specific Gravity, Urine: 1.005 (ref 1.005–1.030)
pH: 6.5 (ref 5.0–8.0)

## 2010-10-22 LAB — SURGICAL PCR SCREEN: Staphylococcus aureus: NEGATIVE

## 2010-10-22 LAB — APTT: aPTT: 38 seconds — ABNORMAL HIGH (ref 24–37)

## 2010-11-07 NOTE — Op Note (Signed)
Summary: Bron[facility]oscopy / [facility]  Bronchoscopy / [facility]   Imported By: Lennie Odor 10/28/2010 11:54:38  _____________________________________________________________________  External Attachment:    Type:   Image     Comment:   External Document

## 2010-12-24 NOTE — Op Note (Signed)
[name] Page, ZEMAITIS               ACCOUNT NO.:  [id number]   MEDICAL RECORD NO.:  [id number]          PATIENT TYPE:  INP   LOCATION:  NA                           FACILITY[facility]   PHYSICIAN:  Ollen Gross, M.D.    DATE OF BIRTH:  Feb 04, 1931   DATE OF PROCEDURE:  06/28/2007  DATE OF DISCHARGE:                               OPERATIVE REPORT   PREOPERATIVE DIAGNOSIS:  Osteoarthritis, left hip.   POSTOPERATIVE DIAGNOSIS:  Osteoarthritis, left hip.   PROCEDURE:  Left total hip arthroplasty.   SURGEON:  Ollen Gross, M.D.   ASSISTANT:  Alexzandrew L. Perkins, P.A.-C.   ANESTHESIA:  General.   ESTIMATED BLOOD LOSS:  200.   DRAINS:  None.   COMPLICATIONS:  None.   CONDITION:  Stable to the recovery room.   CLINICAL NOTE:  [name] is a 77 year old female with end-stage  arthritis of the left hip with progressively worsening pain and  dysfunction.  She has failed nonoperative management and presents for  total hip arthroplasty.   PROCEDURE IN DETAIL:  After successful administration of general  anesthetic, the patient was placed in the right lateral decubitus  position with the left side up and held with the hip positioner.  The  left lower extremity was isolated from her perineum with plastic drapes  and prepped and draped in the usual sterile fashion.   A short posterolateral incision was made with a 10 blade through the  subcutaneous tissue to the level of the fascia lata which was incised in  line with the skin incision.  The sciatic nerve was palpated and  protected and the short rotators isolated off the femur.  Capsulectomy  was performed, and the hip was dislocated.  The center of femoral head  was marked, and a trial prosthesis was placed such that the center of  the trial head corresponds to the center of her native femoral head.  An  osteotomy line was marked on the femoral neck and osteotomy made with an  oscillating saw.  The femur was then retracted  anteriorly to gain  acetabular exposure.   Femoral retractors were placed and the labrum and osteophytes removed.  Acetabular reaming started at 45, coursing in increments of 2 up to 51,  and then a 52-mm Pinnacle acetabular shell was placed in anatomic  position and transfixed with two dome screws.  The trial 32-mm neutral  +4 liner was placed.   The femur was prepared with the canal finder and irrigation.  Axial  reaming was performed to 11.5 mm, proximal reaming to a 16B, and the  sleeve machine to a small.  A 16B small trial sleeve was placed with a  16 x 11 stem and a 36 +6 neck, matching native anteversion.  The trial  32 +0 head is placed.  The hip was reduced with outstanding stability.  She had full extension, full external rotation, 70 degrees flexion, 40  degrees adduction, and 90 degrees internal rotation, and 90 degrees of  flexion, and 70 degrees of internal rotation.  By placing the left leg  on top of  the right, her leg lengths were equal.  The hip was then  dislocated and all trials were removed.  The permanent apex hole  eliminator was placed, and the permanent 32-mm neutral +4 Marathon liner  was placed.  On the femoral side, we placed the 16B small sleeve with a  16 x 11 stem and 36 +6 neck, matching native anteversion.  The 32.0 head  was placed, and the hip was reduced with the same stability parameters.  The wound was copiously irrigated with saline solution and short  rotators reattached to the femur through drill holes.  The fascia lata  was then closed with interrupted #1 Vicryl, the subcutaneous tissue  closed with 1-0 and 2-0 Vicryl and subcuticular running 4-0 Monocryl.  The incision was cleaned and dried, and Steri-Strips and a bulky sterile  dressing were applied.   She was then awakened and transported to recovery in stable condition.      Ollen Gross, M.D.  Electronically Signed     FA/MEDQ  D:  06/28/2007  T:  06/28/2007  Job:  [id number]

## 2010-12-24 NOTE — H&P (Signed)
[name] Page, [name] Page NO.:  [id number]   MEDICAL RECORD NO.:  [id number]          PATIENT TYPE:  INP   LOCATION:  1601                         FACILITY:  [facility]   PHYSICIAN:  Ollen Gross, M.D.    DATE OF BIRTH:  11-01-1930   DATE OF ADMISSION:  10/18/2007  DATE OF DISCHARGE:                              HISTORY & PHYSICAL   DATE OF OFFICE VISIT HISTORY AND PHYSICAL:  October 07, 2007.   CHIEF COMPLAINT:  Right knee pain.   HISTORY OF PRESENT ILLNESS:  The patient is a 77 year old female well-  known to Dr. Ollen Gross who has previously undergone a left total hip  back in November of 2008, doing quite well.  Unfortunately, continues to  have right knee pain.  She has end-stage arthritis and has continued  progressive pain from her underlying condition, such that she has  reached the point where she would benefit from undergoing surgical  intervention.  Risks and benefits discussed.  The patient is subsequent  admitted to the hospital.   ALLERGIES:  NO KNOWN DRUG ALLERGIES.   INTOLERANCES:  PERCOCET causes her to feel weird.   CURRENT MEDICATIONS:  1. Gemfibrozil.  2. Lotrel.  3. Celebrex.  4. Calcium.  5. Multi-purpose vitamin.  6. Vitamin DIAGNOSIS.  7. Aspirin.  8. Glucosamine.  9. Omega III fish oil.   PAST MEDICAL HISTORY:  1. Hypertension.  2. Hypercholesterolemia.  3. History of deep vein thrombosis postpartum approximately 40 years      ago.  Also a recent second DVT after foot surgery in 2007.  4. Postmenopausal.   PAST SURGICAL HISTORY:  1. Hemorrhoidectomy.  2. Left foot surgery.  3. Lens implants.  4. Basal cell carcinoma surgery.  5. Left total hip recently in November 2008.   FAMILY HISTORY:  Father deceased at age 45 with heart failure, diabetes.  Mother deceased at age 62 with congestive heart failure, diabetes.  Two  sisters with diabetes.   SOCIAL HISTORY:  Married, retired Airline pilot, nonsmoker.  One drink of  alcohol  daily.  Family will be assisting with care after surgery.   REVIEW OF SYSTEMS:  GENERAL:  No fevers, chills, night sweats.  NEUROLOGIC:  No seizures, syncope or paralysis.  RESPIRATORY:  No  shortness of breath, productive cough or hemoptysis.  CARDIOVASCULAR:  No chest pain, angina, orthopnea.  GI: No nausea, vomiting, diarrhea or  constipation.  GU:  No dysuria, hematuria or discharge.  MUSCULOSKELETAL:  Right knee.   PHYSICAL EXAMINATION:  VITAL SIGNS:  Pulse 76, respirations 14, blood  pressure 132/72.  GENERAL:  A 77 year old white female well-nourished, well-developed, in  no acute distress, alert, oriented and cooperative.  Good historian.  HEENT:  Normocephalic, atraumatic.  Pupils round and reactive.  Oropharynx clear.  Extraocular movements intact.  NECK:  Supple.  No bruits.  CHEST:  Clear.  HEART:  Regular rate and rhythm.  No murmur.  S1-S2 noted.  ABDOMEN:  Soft, nontender.  Bowel sounds present.  RECTAL/BREAST/GENITALIA:  Not done.  Not pertinent to present illness.  EXTREMITIES:  Right knee with marked crepitus,  range of motion 5 to 120,  slight varus malalignment deformity.  No effusion.   IMPRESSION:  Osteoarthritis, right knee.   PLAN:  The patient was admitted to [facility] to undergo a  right total knee replacement arthroplasty.  Surgery will be performed by  Ollen Gross.  She has been seen preoperatively by Dr. Jacky Kindle and felt  stable for up and coming surgery.      Alexzandrew L. Perkins, P.A.C.      Ollen Gross, M.D.  Electronically Signed    ALP/MEDQ  D:  10/17/2007  T:  10/18/2007  Job:  [id number]

## 2010-12-24 NOTE — Discharge Summary (Signed)
[name] Page, [name] Page               ACCOUNT NO.:  [id number]   MEDICAL RECORD NO.:  [id number]          PATIENT TYPE:  INP   LOCATION:  1601                         FACILITY:  [facility]   PHYSICIAN:  Ollen Gross, M.D.    DATE OF BIRTH:  Dec 27, 1930   DATE OF ADMISSION:  10/18/2007  DATE OF DISCHARGE:  10/21/2007                               DISCHARGE SUMMARY   ADMITTING DIAGNOSES:  1. Osteoarthritis, right knee.  2. Hypertension.  3. Hypercholesterolemia.  4. History of deep vein thrombosis postpartum approximately 40 years      ago, and a second deep vein thrombosis after foot surgery, 2007.  5. Postmenopausal.   DISCHARGE DIAGNOSES:  1. Osteoarthritis, right knee, status post right total knee      replacement and arthroplasty.  2. Mild postoperative blood loss anemia, did not require transfusion.  3. Postoperative hyponatremia, improved.  4. Mild postoperative hyperkalemia, improved and normalized.  5. Hypertension.  6. Hypercholesterolemia.  7. History of deep vein thrombosis postpartum approximately 40 years      ago, and a second deep vein thrombosis after foot surgery, 2007.  8. Postmenopausal.   PROCEDURE:  October 18, 2007, right total knee.   SURGEON:  Gus Rankin. Aluisio, MD.   ASSISTANT:  Avel Peace, PA-C.   ANESTHESIA:  Spinal with Duramorph.   TOURNIQUET TIME:  32 minutes.   CONSULTATIONS:  None.   BRIEF HISTORY:  [name] Page is a 77 year old female, with end-stage  arthritis of the right knee with progressive worsening pain and  dysfunction, now presents for total knee arthroplasty.   LABORATORY DATA:  Preop CBC showed a hemoglobin of 13.7, hematocrit of  38.6, white cell count 3.7, platelets 227.  Chem panel on admission all  within normal limits.  PT-INR 12.7 and 0.9 preoperatively.  PTT of 36.  Preop UA:  Small leukocyte-esterase, 0 to 2 white cells.  Serial CBCs  were followed:  Hemoglobin dipped down to 10.8 and then 9.9, and last  H&H 9.3 and 26.2.   Serial protimes were followed and last PT-INR 27.4  and 2.5.  Serial BMETs were followed.  Postop BMET showed an elevated  potassium of 5.6, repeated potassium was normal at 4.7.  Sodium did drop  from 137 to 131 back up to 139.   X-RAYS:  November 23, 2006, no active disease.   ELECTROCARDIOGRAM:  November 23, 2006, normal sinus rhythm, 60 beats per  minute confirmed.   HOSPITAL COURSE:  The patient admitted to [facility],  tolerated her procedure well and later transferred to the recovery room  then to the floor, started on p.c. and p.o. analgesics for pain control  following surgery.  She was placed on Coumadin for DVT prophylaxis.  She  was also given a Lovenox bridge until her INR was therapeutic due to the  history of DVT x2.  She was noted on day one to have an elevated  potassium.  We rechecked it and felt that was a little high and felt  possibly hemolysis.  We did recheck it and potassium was normal at 4.7.  She was  still a little nauseated on morning of day one and treated the  symptoms.  We resumed her home meds including a blood pressure  medication.  Actually got out of bed and did pretty well on day one and  walked about 20 feet.  By day two, she was doing better and pain was  improved.  Her hemoglobin was down a little bit so we put on her iron.  She was asymptomatic with this.  Potassium was normal.  Continued on the  Lovenox/Coumadin therapeutic treatment plan.  At dressing changes, the  incision looked good.  She continued to progress well with physical  therapy, and by day three she was walking well and meeting her goals.  Hemoglobin was 9.3, she was asymptomatic with this, tolerating her meds,  and discharged home.   DISCHARGE PLAN:  1. The patient is discharged home on October 21, 2007.  2. Discharge diagnoses, please see above.  3. Discharge medications:  Dilaudid, Robaxin, Nu-Iron, and Coumadin.   DIET:  Low sodium, heart healthy, low cholesterol diet.    ACTIVITY:  She is weightbearing as tolerated total knee protocol.  Home  Health PT and Home Health nursing.   DISPOSITION:  Home.   CONDITION ON DISCHARGE:  Improved.      [name] Page, P.A.C.      Ollen Gross, M.D.  Electronically Signed    ALP/MEDQ  D:  10/21/2007  T:  10/21/2007  Job:  [id number]

## 2010-12-24 NOTE — Discharge Summary (Signed)
[name] Page, [name] Page               ACCOUNT NO.:  [id number]   MEDICAL RECORD NO.:  [id number]          PATIENT TYPE:  INP   LOCATION:  1618                         FACILITY:  [facility]   PHYSICIAN:  Ollen Gross, M.D.    DATE OF BIRTH:  1931/06/23   DATE OF ADMISSION:  06/28/2007  DATE OF DISCHARGE:  07/01/2007                               DISCHARGE SUMMARY   ADMISSION DIAGNOSES:  1. Osteoarthritis, left hip.  2. Hypertension.  3. Hypercholesterolemia.  4. History of deep vein thrombosis postpartum 40 years ago.  5. Osteoarthritis.   DISCHARGE DIAGNOSES:  1. Osteoarthritis, left hip, status post left total hip replacement      arthroplasty.  2. Postoperative blood loss anemia, did not require transfusion.  3. Mild postoperative hyponatremia, improved.  4. Hypertension.  5. Hypercholesterolemia.  6. History of deep vein thrombosis postpartum 40 years ago.  7. Osteoarthritis.   PROCEDURE:  June 28, 2007, left total hip, with surgery performed by  Dr. Ollen Gross, assisted by Avel Peace, PA-C.  Anesthesia was  general.   CONSULTATIONS:  None.   BRIEF HISTORY:  [name] Page is a 77 year old female with end-stage  arthritis of her left hip.  It has been progressively getting worse.  She has failed nonoperative management and now presents for a total hip  arthroplasty.   LABORATORY DATA:  Preop CBC showed a hemoglobin of 13.1, hematocrit of  37.9, white cell count 4.2, red cell count 4.03, platelets 301.  Serial  CBCs were followed.  Hemoglobin dropped down to 9.4, then got as low as  8.8.  Rechecked hemoglobin 9.2, stabilized and with last noted H&H of  9.2 and 25.9.  Chem panel on admission all within normal limits.  Serial  BMETs were followed.  Sodium did drop from 144 to 130, probably  dilutional, came back up to 136.  Remaining BMETs within normal limits.  PT/INR 13.1 and 1.0 with a pro time of 35 preoperatively.  Serial pro  times followed.  Last noted PT/INR 19.17  and 1.6.  Preop UA negative.   Chest x-ray dated November 23, 2006:  No active disease.  Left hip films  preop June 23, 2007:  Advanced osteoarthritis, left hip.   EKG November 23, 2006:  Normal sinus rhythm, rate about 60, confirmed.   HOSPITAL COURSE:  The patient was admitted to [facility],  tolerated the procedure well, later transferred to the recovery room and  the orthopedic floor, started on PCA and p.o. analgesics for pain  control following surgery.  Given 24 hours postop IV antibiotics.  Started on Coumadin for DVT prophylaxis.  Did have a history of clot  about 40 years ago, so she was also started on Lovenox until the INR got  more therapeutic.  She was doing pretty well on morning of day #1, just  some pain with movement.  Sodium was low, felt to be dilutional.  We  decreased her fluids and sodium concentrated back up.  She was weaned  over to p.o. medications.  We discontinued the PCA after lunch on postop  day #1, started  getting out of bed, partial weightbearing 25-50%.  The  I&O's were followed.  Output was a little slow to begin with but it did  pick up.  She had excellent urinary output.  By day #2 she was getting  up ambulating short distances, got up about 70 feet on the afternoon of  day #1 and then continued to progress well but day #2 dressing was  changed on day #2.  The incision looked good.  Hemoglobin was a little  bit lower.  She was started on iron.  It was rechecked and came back up  to 9.2.  The 8.8 was probably a dilutional component also.  She diuresed  well blood pressure was stable but a little on the lower side so we held  her Lotrel postoperatively.  Blood pressure remained quite stable  throughout the hospital course without her blood pressure medication.  We did recommend that she follow her blood pressure checks daily if not  more at home and then to resume her blood pressure as it titrated back  up.  We suggested to wait until systolics  get about 130 before she  resumed her home medication.  She began to progress well and on the  morning of November 20 she was doing well, tolerating medications, and  was discharged home.   DISCHARGE/PLAN:  1. The patient was discharged home on July 01, 2007.  2. Discharge diagnoses:  Please see above.  3. Discharge medications:  Coumadin, Nu-Iron, Percocet, Robaxin.  4. Diet:  Resume heart-healthy diet at home.  5. Activity:  She is partial weightbearing, 25-50% to left lower      extremity, hip precautions, total hip protocol.  6. Home health PATIENT, home health nursing.  7. Follow-up:  Follow up the week after Thanksgiving with Dr. Lequita Halt.      Contact the office at [phone].   DISPOSITION:  Home.   CONDITION ON DISCHARGE:  Improving.   FURTHER INSTRUCTIONS:  Again, we did redirect with the patient to hold  her blood pressure medications while her blood pressure is running low  and then she is going to resume them at home.  She is going to be doing  daily blood pressure checks.      Alexzandrew L. Perkins, P.A.C.      Ollen Gross, M.D.  Electronically Signed    ALP/MEDQ  D:  07/01/2007  T:  07/01/2007  Job:  [id number]   cc:   Geoffry Paradise, M.D.  Fax: [phone]

## 2010-12-24 NOTE — Op Note (Signed)
[name], Page NO.:  [id number]   MEDICAL RECORD NO.:  [id number]          PATIENT TYPE:  INP   LOCATION:  1601                         FACILITY:  [facility]   PHYSICIAN:  Ollen Gross, M.D.    DATE OF BIRTH:  Oct 22, 1930   DATE OF PROCEDURE:  10/18/2007  DATE OF DISCHARGE:                               OPERATIVE REPORT   PREOPERATIVE DIAGNOSIS:  Osteoarthritis of the right knee.   POSTOPERATIVE DIAGNOSIS:  Osteoarthritis of the right knee.   PROCEDURE:  Right total knee arthroplasty.   SURGEON:  Ollen Gross, M.D.   ASSISTANT:  Alexzandrew L. Perkins, P.A.C.   ANESTHESIA:  Spinal with Duramorph.   ESTIMATED BLOOD LOSS:  Minimal.   DRAINS:  None.   TOURNIQUET TIME:  32 minutes at 300 mmHg.   COMPLICATIONS:  None.   CONDITION.:  Stable to recovery.   BRIEF CLINICAL NOTE:  [name] Page is a 77 year old female with end-stage  arthritis of the right knee, with progressively worsening pain and  dysfunction.  She has failed nonoperative management and presents now  for total knee arthroplasty.   PROCEDURE IN DETAIL:  After the successful administration of spinal  anesthetic, a tourniquet was placed high on the right thigh, and the  right lower extremity prepped and draped in the usual sterile fashion.  The extremity was wrapped in Esmarch, the knee flexed and tourniquet  inflated to 300 mmHg.  A midline incision was made with a 10 blade  through the subcutaneous tissue, to the level of the extensor mechanism.  A fresh blade was used to make a medial parapatellar arthrotomy.  The  soft tissue over the proximal and medial tibia was subperiosteally  elevated to the joint line with the knife, and into the semimembranosus  bursa with a Cobb elevator.  The soft tissue laterally was elevated,  with attention being paid to avoid the patellar tendon on the tibial  tubercle.  The patella was subluxed laterally, the knee flexed 90  degrees.  The ACL and PCL were  removed.  A drill was used to create a  starting hole in the distal femur, and the canal was thoroughly  irrigated.  A 5-degree right valgus alignment guide was placed,  referencing off the posterior condyles.  Rotations were marked and the  block pinned to remove 10 mm off the distal femur.  Distal femoral  resection was made with an oscillating saw.  Sizing blocks were placed;  a size 2.5 was most appropriate.  Rotations were marked at the  epicondylar axis.  The size 2.5 cutting block was placed, and the  anterior, posterior and chamfer cuts were made.   The tibia was subluxed forward and the menisci were removed.  The  extramedullary tibial alignment guide was placed, referencing proximally  at the medial aspect of the tibial tubercle and distally along the  second metatarsal axis and tibial crest.  The block was pinned to remove  10 mm off the non-deficient lateral side.  Tibial resection was made  with an oscillating saw.  A size 2.5 was the most appropriate tibial  component, and then the proximal tibia was prepared with a modular drill  and keel punch for a size 2.5.  The femoral preparation was completed  with the intercondylar cut.   A size 2.5 mobile bearing tibial trial, 2.5 posterior stabilized femoral  trial, and a 10 mm posterior stabilized rotating platform insert trial  were placed.  With a 10 she hyperextended a tiny bit; and had a tiny bit  of varus and valgus laxity.  So, I went with the 12.5, which allowed for  full extension with excellent varus and valgus anterior and posterior  balance throughout full range of motion.  The patella was then everted  and thickness measured to be 22 mm.  Freehand resection was taken to 12  mm.  The 35 template was placed.  The lug holes were drilled.  A trial  patella was placed and it tracked normally.  Osteophytes were removed  off the posterior femur with the trial in place.  All trials were  removed and the cut bone surfaces  prepared with pulsatile lavage.  Cement was mixed, and once ready for implantation the size 2.5 mobile  bearing tibial tray, 2.5 posterior stabilized femur and 35 patella were  cemented into place.  The patella was held with a clamp.  The trial 12.5  mm insert was placed.  The knee was held in full extension, and all  extruded cement removed.  When the cement had fully hardened, then we  copiously irrigated the joint with saline solution and injected FloSeal  on the posterior capsule.  The permanent 12.5 mm posterior stabilized  rotating platform insert was then placed into the tibial tray.  The  remainder of the FloSeal was placed in the suprapatellar area and the  medial and lateral gutters.  A moist sponge was placed and tourniquet  released, with a total time of 38 minutes.  The sponge was held for 2  minutes and then removed.  Minimal bleeding was encountered.  That which  was encountered was stopped with electrocautery.  The wounds were then  again irrigated.  The extensor mechanism was closed with interrupted #1  PDS.  Flexion against gravity was 140 degrees.  The subcutaneous was  closed with interrupted 2-0 Vicryl and subcuticular running 4-0  Monocryl.  The incision was cleaned and dried, and Steri-Strips and a  bulky sterile dressing applied.  She was then placed into a knee  immobilizer, awakened and transported to recovery in stable condition.      Ollen Gross, M.D.  Electronically Signed     FA/MEDQ  D:  10/18/2007  T:  10/18/2007  Job:  [id number]

## 2010-12-24 NOTE — H&P (Signed)
[name] Page, [name] Page NO.:  [id number]   MEDICAL RECORD NO.:  [id number]          PATIENT TYPE:  INP   LOCATION:  0003                         FACILITY:  [facility]   PHYSICIAN:  Ollen Gross, M.D.    DATE OF BIRTH:  [birth year]   DATE OF ADMISSION:  06/28/2007  DATE OF DISCHARGE:                              HISTORY & PHYSICAL   DATE OF OFFICE VISIT HISTORY AND PHYSICAL:  June 15, 2007.   CHIEF COMPLAINT:  Left hip pain.   HISTORY OF PRESENT ILLNESS:  The patient is a 77 year old female whose  been seen by Dr. Lequita Halt for ongoing left hip pain that has been  progressive in nature over quite some time now. She is at a stage now  were her left hip is bothering her all the time. She is seen in the  office and x-rays show end-stage arthritis of the left hip with bone-on-  bone and osteophyte formation.  It is felt that she would benefit from  undergoing a hip replacement. The risks and benefits were discussed.  The patient was subsequently admitted to the hospital.  The patient has  been seen preoperatively by Dr. Jacky Kindle and felt to be medically cleared  for her up and coming surgery.   No known drug allergies.   INTOLERANCES:  Percocet makes her feel weird and there are several  medications she does not recall the names of that have caused nausea.   CURRENT MEDICATIONS:  Gemfibrozil, Lotrel, calcium, multipurpose  vitamin, glucosamine chondroitin, omega III fish oil, DHA, vitamin D,  aspirin.   PAST MEDICAL HISTORY:  Hypertension, hypercholesterolemia, history of  deep vein thrombosis postpartum approximately 40 years ago and also  osteoarthritis.   PAST SURGICAL HISTORY:  Hemorrhoidectomy, left foot surgery, lens  implants right eye and left eye and basal cell carcinoma, excision right  leg.   SOCIAL HISTORY:  Married, retired Airline pilot, nonsmoker. One beer or  glass of wine daily.  Two children and husband will be assisting with  care after surgery.   FAMILY HISTORY:  Father with history of heart disease, hypertension,  diabetes, and arthritis.  Mother with history of stroke, heart disease,  and diabetes.   REVIEW OF SYSTEMS:  GENERAL:  No fevers, chills, or night sweats.  NEUROLOGIC:  No seizures, syncope or paralysis.  RESPIRATORY:  No  shortness breath, productive cough or hemoptysis.  CARDIOVASCULAR:  No  chest pain, angina, orthopnea. GI:  No nausea, vomiting, diarrhea or  constipation.  GU:  No dysuria, hematuria or discharge.  MUSCULOSKELETAL:  Left hip.   PHYSICAL EXAM:  VITAL SIGNS:  Pulse 60, respirations 12, blood pressure  110/58.  GENERAL:  A 77 year old, white female, well-nourished, well-developed,  in no acute distress, alert, oriented and cooperative, pleasant.  Good  historian.  HEENT:  Normocephalic, atraumatic.  Pupils are round and reactive.  Oropharynx clear.  EOMs intact.  NECK:  Supple.  No bruits.  CHEST:  Clear anterior and posterior chest. No rhonchi, rales or  wheezing.  HEART:  Regular rate and rhythm.  No murmur, S1, S2 noted.  ABDOMEN:  Soft, nontender.  Bowel sounds present.  RECTAL/BREASTS/GENITALIA:  Not done not pertinent to present illness.  EXTREMITIES:  Left hip flexion 100 degrees, zero internal rotation,  10  degrees external rotation, 10-20 degrees abduction, pain with motion.   IMPRESSION:  Osteoarthritis left hip.   PLAN:  The patient was admitted to [facility] to undergo a  left total hip replacement arthroplasty. The surgery will be performed  by Dr. Ollen Gross.      Alexzandrew L. Perkins, P.A.C.      Ollen Gross, M.D.  Electronically Signed    ALP/MEDQ  D:  06/27/2007  T:  06/28/2007  Job:  [id number]   cc:   Geoffry Paradise, MD  Fax: [phone]

## 2010-12-27 NOTE — Op Note (Signed)
[name] Page, [name] Page               ACCOUNT NO.:  [id number]   MEDICAL RECORD NO.:  [id number]          PATIENT TYPE:  AMB   LOCATION:  DSC                          FACILITY:  [facility]   PHYSICIAN:  Leonides Grills, M.D.     DATE OF BIRTH:  [birth year]   DATE OF PROCEDURE:  07/08/2005  DATE OF DISCHARGE:                                 OPERATIVE REPORT   PREOPERATIVE DIAGNOSES:  1.  Left hallux valgus.  2.  Left second metatarsalgia.  3.  Left second hammertoe.   POSTOPERATIVE DIAGNOSES:  1.  Left hallux valgus.  2.  Left second metatarsalgia.  3.  Left second hammertoe.   OPERATION:  1.  Left Chevron bunionectomy.  2.  Stress x-rays left foot.  3.  Left second Weil metatarsal shortening osteotomy.  4.  Left second toe MTP joint dorsal capsulotomy with collateral release.  5.  Left second toe proximal phalanx head resection.  6.  Left second toe FDL to proximal phalanx tendon transfer.  7.  Left second toe EDB to EDL tendon transfer.   ANESTHESIA:  General with block.   SURGEON:  Leonides Grills, M.D.   ASSISTANT:  Lianne Cure, P.A.   ESTIMATED BLOOD LOSS:  Minimal.   TOURNIQUET TIME:  Approximately an hour.   COMPLICATIONS:  None.   DISPOSITION:  Stable to the PR.   INDICATIONS:  This is a 77 year old female who has had longstanding left  forefoot pain that was interfering with her life __________  despite  conservative management.  She was consented for the above procedure.  All  risks which include infection, nerve or vessel injury, nonunion, malunion,  hardware irritation, hardware failure, persistent pain, worse pain,  prolonged recovery, stiffness, arthritis, recurrence or deformity were all  explained, questions were encouraged and answered.   OPERATION:  The patient was brought to the operating room and placed in the  supine position after adequate general endotracheal tube anesthesia with  block was administered as well as Ancef 1 gram IV piggyback.  The left  lower  extremity was then prepped and draped in a sterile manner over a proximally  placed thigh tourniquet.  The limb was gravity exsanguinated, and tourniquet  was elevated to 290 mmHg.  A longitudinal incision over the medial aspect of  the left great toe MTP joint was then made.  Dissection was carried down  through the skin.  Hemostasis was obtained.  Neurovascular structures were  identified both superiorly and inferiorly and protected throughout the case.  L-shaped capsulotomy was then made.  A simple bunionectomy was then  performed with a sagittal saw.  ___________ ridge was then rounded off with  a rongeur.  Lateral capsule was then released with a curved Beaver blade.  Center of head was then identified and marked.  Soft tissue was elevated  both superiorly and inferiorly and protected, and a Chevron osteotomy was  then created with a sagittal saw.  The head was then translated about 3 to 4  mm laterally and then fixed with a 2.0-mm fully threaded cortical set screw  using a 1.5-mm drill  hole respectively.  This had excellent purchase and  maintenance of the correction.  The redundant bone medially was then trimmed  off with a sagittal saw.  The joint and area were copiously irrigated with  normal saline.  Capsule was then advanced and repaired both superiorly and  proximally with 2-0 Vicryl suture.  This had an Conservation officer, historic buildings.  Range  of motion of the joint was excellent.  Stress x-rays were obtained in the AP  and lateral planes and showed that there was no gross motion across the  osteotomy site, fixation was in proper position and correction was in proper  position as well.  Once again the wound was copiously irrigated with normal  saline.   A longitudinal incision was made over the left second toe.  Dissection was  carried down through skin.  Hemostasis was obtained.  The extensor digitorum  longus and brevis tendons were dissected out carefully, and the longus was   tenotomized proximal medial and the brevis was tenotomized distal lateral,  retracted out of harm's way for later transfer.  An MTP joint dorsal  capsulotomy was then performed with a 15 blade scalpel with protecting the  neurovascular structures by the assistant both medially and laterally, and  the collateral ligaments were released as well.  There were arthritic  changes on the dorsal aspect of the second metatarsal head from chronic  dislocation of the MTP joint.  The PIP joint was then entered.  The distal  aspect of the proximal phalanx was skeletonized and the head was then  removed with a rongeur followed by bone cutter.  The cuts were made  perpendicular to the long axis of proximal phalanx.  The FDL tendon was then  identified via a longitudinal incision in the PIP joint plantar plate and  dissected out as distal as possible and then tenotomized.  The soft tissue  was then dissected carefully to release and allow the FDL tendon to be  transferred to the base of the proximal phalanx.  Sequential drill holes  were then placed in the base of the proximal phalanx with 2.5 and 3.2 mm  drill holes.  Prior to transferring the tendon, we plantar flexed the MTP  joint and with stacked sagittal saw blades made an osteotomy through the  metatarsal head parallel to the plantar aspect of the foot.  The head was  then translated proximally 3 to 4 mm and then fixed with a 1.5 mm fully  threaded cortical lag screw using 1.5, 1.1 mm drill holes respectively.  This had excellent purchase and maintenance of the correction.  We then  transferred the FDL to proximal phalanx tendon through the drill hole from  plantar to dorsal using 3-0 PDS suture.  Once this was pulled through, the  MTP joint was held in a reduced position and a 0.045 double ended trocar K  wire was then fired antegrade through the middle and distal phalanx, PIP  joint was reduced and then fired slightly retrograde.  We then reduced  the MTP joint, held the toe in a reduced position in varus/valgus and then fired  the K wire across the MTP joint.  This had excellent purchase and  maintenance of the correction.  This was done holding the FDL tendon with  the proper tension with the ankle in neutral dorsiflexion.  Once this was  done, we then trimmed off the tendon.  I then transferred the EDB to EDL  tendons using 3-0 PDS suture and  sewing this to the FDL tendon itself.  Final x-rays were obtained in AP and lateral planes and showed that fixation  and correction were in the proper position.  There was a proper amount of  shortening of the second metatarsal as well.  The joint and area were  copiously irrigated with normal saline.  Also, the redundant ridge on the  dorsal aspect of Weil osteotomy was trimmed off with rongeur.  Prior to  fixation across the MTP joint, the joint excursion was done and there was no  impinging areas dorsally.  Tourniquet was deflated.  Hemostasis was  obtained.  The toe pinked up nicely.  Skin-relieving incisions were made on  either side of the K wire.  K wire was bent, cut and  capped.  The wounds were closed over all wounds after they were copiously  irrigated with normal saline with 4-0 nylon suture.  Sterile dressing was  applied.  Roger Mann dressing was applied.  Hard-sole shoe was applied.  The  patient was stable to PR.      Leonides Grills, M.D.  Electronically Signed     PB/MEDQ  D:  07/08/2005  T:  07/08/2005  Job:  [id number]

## 2011-03-24 ENCOUNTER — Other Ambulatory Visit: Payer: Self-pay | Admitting: Neurology

## 2011-03-24 DIAGNOSIS — G44009 Cluster headache syndrome, unspecified, not intractable: Secondary | ICD-10-CM

## 2011-05-05 LAB — CBC
HCT: 26.2 — ABNORMAL LOW
HCT: 27.7 — ABNORMAL LOW
HCT: 30.8 — ABNORMAL LOW
HCT: 38.6
Hemoglobin: 10.8 — ABNORMAL LOW
Hemoglobin: 13.7
Hemoglobin: 9.9 — ABNORMAL LOW
MCHC: 35.3
MCHC: 35.4
MCHC: 35.9
MCV: 89.4
MCV: 89.8
MCV: 90.1
MCV: 90.6
Platelets: 196
RBC: 4.3
RDW: 13.4
RDW: 13.7
WBC: 5.3
WBC: 6.2

## 2011-05-05 LAB — PROTIME-INR
INR: 0.9
Prothrombin Time: 12.7

## 2011-05-05 LAB — BASIC METABOLIC PANEL
CO2: 27
CO2: 30
Chloride: 103
Chloride: 106
Chloride: 99
GFR calc Af Amer: >60
GFR calc non Af Amer: >60
GFR calc non Af Amer: >60
Glucose, Bld: 118 — ABNORMAL HIGH
Glucose, Bld: 144 — ABNORMAL HIGH
Potassium: 4.5
Potassium: 4.7
Potassium: 5.6 — ABNORMAL HIGH
Sodium: 131 — ABNORMAL LOW
Sodium: 137
Sodium: 139

## 2011-05-05 LAB — TYPE AND SCREEN: Antibody Screen: NEGATIVE

## 2011-05-05 LAB — URINALYSIS, ROUTINE W REFLEX MICROSCOPIC
Glucose, UA: NEGATIVE
Hgb urine dipstick: NEGATIVE
Ketones, ur: NEGATIVE
Protein, ur: NEGATIVE
Urobilinogen, UA: 0.2

## 2011-05-05 LAB — COMPREHENSIVE METABOLIC PANEL
ALT: 16
BUN: 15
CO2: 25
Calcium: 9.8
GFR calc non Af Amer: >60
Glucose, Bld: 92
Total Protein: 7.8

## 2011-05-05 LAB — URINE MICROSCOPIC-ADD ON

## 2011-05-16 LAB — DIFFERENTIAL
Eosinophils Absolute: 0.1 10*3/uL (ref 0.0–0.7)
Eosinophils Relative: 1 % (ref 0–5)
Lymphocytes Relative: 15 % (ref 12–46)
Lymphs Abs: 0.8 10*3/uL (ref 0.7–4.0)
Monocytes Relative: 8 % (ref 3–12)
Neutrophils Relative %: 76 % (ref 43–77)

## 2011-05-16 LAB — CBC
HCT: 38.3 % (ref 36.0–46.0)
MCV: 95.8 fL (ref 78.0–100.0)
Platelets: 214 10*3/uL (ref 150–400)
RBC: 4 MIL/uL (ref 3.87–5.11)
WBC: 5.7 10*3/uL (ref 4.0–10.5)

## 2011-05-16 LAB — POCT CARDIAC MARKERS
Myoglobin, poc: 103 ng/mL (ref 12–200)
Troponin i, poc: <0.05 ng/mL (ref 0.00–0.09)

## 2011-05-16 LAB — BASIC METABOLIC PANEL
Chloride: 106 mEq/L (ref 96–112)
GFR calc Af Amer: >60 mL/min (ref >60)
GFR calc non Af Amer: >60 mL/min (ref >60)
Potassium: 4.6 mEq/L (ref 3.5–5.1)

## 2011-05-20 LAB — CBC
HCT: 24.6 — ABNORMAL LOW
Hemoglobin: 8.8 — ABNORMAL LOW
Hemoglobin: 9.2 — ABNORMAL LOW
Hemoglobin: 13.1
MCHC: 34.6
MCV: 93.1
MCV: 93.3
Platelets: 169
Platelets: 202
RBC: 2.64 — ABNORMAL LOW
RBC: 2.84 — ABNORMAL LOW
RBC: 4.03
RDW: 13.2
WBC: 6
WBC: 6
WBC: 7.1
WBC: 4.2

## 2011-05-20 LAB — PROTIME-INR
INR: 1.6 — ABNORMAL HIGH
Prothrombin Time: 14.3
Prothrombin Time: 13.1

## 2011-05-20 LAB — COMPREHENSIVE METABOLIC PANEL
ALT: 14
AST: 19
Alkaline Phosphatase: 73
CO2: 27
Calcium: 10
Chloride: 107
GFR calc Af Amer: >60
GFR calc non Af Amer: >60
Glucose, Bld: 103 — ABNORMAL HIGH
Potassium: 4.7
Sodium: 144
Total Bilirubin: 0.8

## 2011-05-20 LAB — URINALYSIS, ROUTINE W REFLEX MICROSCOPIC
Bilirubin Urine: NEGATIVE
Glucose, UA: NEGATIVE
Hgb urine dipstick: NEGATIVE
Nitrite: NEGATIVE

## 2011-05-20 LAB — BASIC METABOLIC PANEL
BUN: 7
Calcium: 8.1 — ABNORMAL LOW
Chloride: 106
GFR calc Af Amer: >60
GFR calc Af Amer: >60
GFR calc non Af Amer: >60
GFR calc non Af Amer: >60
Potassium: 4.2
Potassium: 4.3
Sodium: 130 — ABNORMAL LOW
Sodium: 136

## 2011-05-20 LAB — TYPE AND SCREEN
ABO/RH(D): O POS
Antibody Screen: NEGATIVE

## 2011-05-20 LAB — HEMOGLOBIN AND HEMATOCRIT, BLOOD
HCT: 25.8 — ABNORMAL LOW
Hemoglobin: 9.2 — ABNORMAL LOW

## 2011-06-05 DIAGNOSIS — H059 Unspecified disorder of orbit: Secondary | ICD-10-CM | POA: Insufficient documentation

## 2011-06-05 DIAGNOSIS — H0589 Other disorders of orbit: Secondary | ICD-10-CM | POA: Insufficient documentation

## 2011-06-13 ENCOUNTER — Ambulatory Visit
Admission: RE | Admit: 2011-06-13 | Discharge: 2011-06-13 | Disposition: A | Discharge status: Home / Self Care | Payer: Medicare Other | Source: Ambulatory Visit | Attending: Neurology | Admitting: Neurology

## 2011-06-13 DIAGNOSIS — G44009 Cluster headache syndrome, unspecified, not intractable: Secondary | ICD-10-CM

## 2011-08-18 DIAGNOSIS — H6120 Impacted cerumen, unspecified ear: Secondary | ICD-10-CM | POA: Diagnosis not present

## 2011-08-18 DIAGNOSIS — I10 Essential (primary) hypertension: Secondary | ICD-10-CM | POA: Diagnosis not present

## 2011-08-18 DIAGNOSIS — H9209 Otalgia, unspecified ear: Secondary | ICD-10-CM | POA: Diagnosis not present

## 2011-09-24 ENCOUNTER — Ambulatory Visit (INDEPENDENT_AMBULATORY_CARE_PROVIDER_SITE_OTHER): Payer: Medicare Other | Admitting: Gynecology

## 2011-09-24 ENCOUNTER — Encounter: Payer: Self-pay | Admitting: Gynecology

## 2011-09-24 VITALS — BP 142/80 | Ht 61.25 in | Wt 149.0 lb

## 2011-09-24 DIAGNOSIS — Z1211 Encounter for screening for malignant neoplasm of colon: Secondary | ICD-10-CM | POA: Diagnosis not present

## 2011-09-24 DIAGNOSIS — N952 Postmenopausal atrophic vaginitis: Secondary | ICD-10-CM

## 2011-09-24 DIAGNOSIS — Z86718 Personal history of other venous thrombosis and embolism: Secondary | ICD-10-CM

## 2011-09-24 DIAGNOSIS — Z87440 Personal history of urinary (tract) infections: Secondary | ICD-10-CM

## 2011-09-24 HISTORY — DX: Personal history of urinary (tract) infections: Z87.440

## 2011-09-24 HISTORY — DX: Postmenopausal atrophic vaginitis: N95.2

## 2011-09-24 MED ORDER — ESTRADIOL 10 MCG VA TABS: 1.0000 | ORAL_TABLET | VAGINAL | Status: DC

## 2011-09-24 NOTE — Patient Instructions (Addendum)
You may apply the vagifem tablet vaginally twice a week starting Monday if I do not call you with any abnormality on todays blood work.

## 2011-09-24 NOTE — Progress Notes (Signed)
[name] Page [birth year] [id number]   History:    78 y.o.  who presented to the office for her gynecological examination. She has had issues in the past with vaginal atrophy. She's also had history of recurrent urinary tract infections in the past. Review of her record indicated that she a history of DVT in 2006 after foot surgery. But on further questioning she stated many years ago after the birth of one of her children she also had a DVT. Her primary physician is Dr. Jacky Kindle who is been monitoring her hypertension and hypercholesterolemia and doing her lab work as well. Patient's last mammogram September 2012 was normal. She does her monthly self breast examination. Her last colonoscopy report to be normal in 2010. In 2002 and she had been placed on Vagifem 10 mcg intravaginally twice a week to help with her vaginal atrophy but she discontinued on her own.  Past medical history,surgical history, family history and social history were all reviewed and documented in the EPIC chart.  Gynecologic History No LMP recorded. Patient is postmenopausal. Contraception: none Last Pap: 2010. Results were: normal Last mammogram: 2012. Results were: normal  Obstetric History OB History    Grav Para Term Preterm Abortions TAB SAB Ect Mult Living   2 2 2       2      # Outc Date GA Lbr Len/2nd Wgt Sex Del Anes PTL Lv   1 TRM     M SVD  No Yes   2 TRM     M SVD  No Yes       ROS:  Was performed and pertinent positives and negatives are included in the history.  Exam: chaperone present  BP 142/80  Ht 5' 1.25" (1.556 m)  Wt 149 lb (67.586 kg)  BMI 27.92 kg/m2  Body mass index is 27.92 kg/(m^2).  General appearance : Well developed well nourished female. No acute distress HEENT: Neck supple, trachea midline, no carotid bruits, no thyroidmegaly Lungs: Clear to auscultation, no rhonchi or wheezes, or rib retractions  Heart: Regular rate and rhythm, no murmurs or gallops Breast:Examined in sitting  and supine position were symmetrical in appearance, no palpable masses or tenderness,  no skin retraction, no nipple inversion, no nipple discharge, no skin discoloration, no axillary or supraclavicular lymphadenopathy Abdomen: no palpable masses or tenderness, no rebound or guarding Extremities: no edema or skin discoloration or tenderness  Pelvic:  Bartholin, Urethra, Skene Glands: Within normal limits             Vagina: No gross lesions or discharge, vaginal atrophy  Cervix: No gross lesions or discharge  Uterus  axial, normal size, shape and consistency, non-tender and mobile  Adnexa  Without masses or tenderness  Anus and perineum  normal   Rectovaginal  normal sphincter tone without palpated masses or tenderness             Hemoccult obtained results pending at time of this dictation     Assessment/Plan:  78 y.o. female gynecological examination with evidence of vaginal atrophy. Since patient has had 2 histories of DVT in the past for which I was only where the most recent one in 2006 ongoing to check a Leiden factor V to make sure she doesn't have the mutation for hypercoagulability. We did discuss the to improve her vaginal epithelium in her distal one third urethra that a low dose minimally absorbed estrogen such as Vagifem 10 mcg could be administered twice a week. She will wait until  you get the test results on Monday if normal she will start. We discussed the new screening Pap smear guidelines and she no longer needs to have Pap smears she has never had any abnormal Pap smears in the past. Fecal occult blood testing pending at time of this dictation. Patient has refused a bone density study. She is taking her calcium and vitamin D as well. Patient recently with spontaneous pneumothorax have been followed at [facility] doing well. She was encouraged to do her monthly self breast examinations.   Ok Edwards MD, 4:41 PM 09/24/2011

## 2011-09-25 ENCOUNTER — Other Ambulatory Visit: Payer: Self-pay | Admitting: Anesthesiology

## 2011-09-25 DIAGNOSIS — Z86718 Personal history of other venous thrombosis and embolism: Secondary | ICD-10-CM

## 2011-09-25 LAB — POC HEMOCCULT BLD/STL (OFFICE/1-CARD/DIAGNOSTIC): Fecal Occult Blood, POC: NEGATIVE

## 2011-09-26 ENCOUNTER — Other Ambulatory Visit: Payer: Medicare Other

## 2011-09-26 DIAGNOSIS — Z86718 Personal history of other venous thrombosis and embolism: Secondary | ICD-10-CM

## 2011-09-29 LAB — FACTOR 5 LEIDEN

## 2011-11-13 DIAGNOSIS — H04129 Dry eye syndrome of unspecified lacrimal gland: Secondary | ICD-10-CM | POA: Diagnosis not present

## 2011-11-13 DIAGNOSIS — H1045 Other chronic allergic conjunctivitis: Secondary | ICD-10-CM | POA: Diagnosis not present

## 2011-11-13 DIAGNOSIS — H401290 Low-tension glaucoma, unspecified eye, stage unspecified: Secondary | ICD-10-CM | POA: Diagnosis not present

## 2011-11-13 DIAGNOSIS — Z961 Presence of intraocular lens: Secondary | ICD-10-CM | POA: Diagnosis not present

## 2011-11-14 ENCOUNTER — Encounter: Payer: Self-pay | Admitting: *Deleted

## 2011-11-14 DIAGNOSIS — R079 Chest pain, unspecified: Secondary | ICD-10-CM | POA: Insufficient documentation

## 2012-01-14 DIAGNOSIS — D3160 Benign neoplasm of unspecified site of unspecified orbit: Secondary | ICD-10-CM | POA: Diagnosis not present

## 2012-01-20 DIAGNOSIS — J984 Other disorders of lung: Secondary | ICD-10-CM | POA: Diagnosis not present

## 2012-01-20 DIAGNOSIS — J9819 Other pulmonary collapse: Secondary | ICD-10-CM | POA: Diagnosis not present

## 2012-02-04 DIAGNOSIS — L821 Other seborrheic keratosis: Secondary | ICD-10-CM | POA: Diagnosis not present

## 2012-02-04 DIAGNOSIS — L723 Sebaceous cyst: Secondary | ICD-10-CM | POA: Diagnosis not present

## 2012-02-20 DIAGNOSIS — R82998 Other abnormal findings in urine: Secondary | ICD-10-CM | POA: Diagnosis not present

## 2012-02-20 DIAGNOSIS — I10 Essential (primary) hypertension: Secondary | ICD-10-CM | POA: Diagnosis not present

## 2012-02-20 DIAGNOSIS — E785 Hyperlipidemia, unspecified: Secondary | ICD-10-CM | POA: Diagnosis not present

## 2012-02-23 DIAGNOSIS — Z1212 Encounter for screening for malignant neoplasm of rectum: Secondary | ICD-10-CM | POA: Diagnosis not present

## 2012-03-03 DIAGNOSIS — M1990 Unspecified osteoarthritis, unspecified site: Secondary | ICD-10-CM | POA: Diagnosis not present

## 2012-03-03 DIAGNOSIS — E785 Hyperlipidemia, unspecified: Secondary | ICD-10-CM | POA: Diagnosis not present

## 2012-03-03 DIAGNOSIS — Z0000 Encounter for general adult medical examination without abnormal findings: Secondary | ICD-10-CM | POA: Diagnosis not present

## 2012-03-03 DIAGNOSIS — I10 Essential (primary) hypertension: Secondary | ICD-10-CM | POA: Diagnosis not present

## 2012-04-29 DIAGNOSIS — Z23 Encounter for immunization: Secondary | ICD-10-CM | POA: Diagnosis not present

## 2012-05-04 DIAGNOSIS — Z1231 Encounter for screening mammogram for malignant neoplasm of breast: Secondary | ICD-10-CM | POA: Diagnosis not present

## 2012-05-06 ENCOUNTER — Encounter: Payer: Self-pay | Admitting: Gynecology

## 2012-05-11 DIAGNOSIS — Z961 Presence of intraocular lens: Secondary | ICD-10-CM | POA: Diagnosis not present

## 2012-05-11 DIAGNOSIS — H04129 Dry eye syndrome of unspecified lacrimal gland: Secondary | ICD-10-CM | POA: Diagnosis not present

## 2012-05-11 DIAGNOSIS — H409 Unspecified glaucoma: Secondary | ICD-10-CM | POA: Diagnosis not present

## 2012-05-11 DIAGNOSIS — H401290 Low-tension glaucoma, unspecified eye, stage unspecified: Secondary | ICD-10-CM | POA: Diagnosis not present

## 2012-07-13 DIAGNOSIS — H0589 Other disorders of orbit: Secondary | ICD-10-CM | POA: Diagnosis not present

## 2012-07-19 DIAGNOSIS — G939 Disorder of brain, unspecified: Secondary | ICD-10-CM | POA: Diagnosis not present

## 2012-07-19 DIAGNOSIS — Z961 Presence of intraocular lens: Secondary | ICD-10-CM | POA: Diagnosis not present

## 2012-07-19 DIAGNOSIS — J342 Deviated nasal septum: Secondary | ICD-10-CM | POA: Diagnosis not present

## 2012-07-19 DIAGNOSIS — H0589 Other disorders of orbit: Secondary | ICD-10-CM | POA: Diagnosis not present

## 2012-07-20 DIAGNOSIS — H05119 Granuloma of unspecified orbit: Secondary | ICD-10-CM | POA: Diagnosis not present

## 2012-07-20 DIAGNOSIS — H059 Unspecified disorder of orbit: Secondary | ICD-10-CM | POA: Diagnosis not present

## 2012-09-06 DIAGNOSIS — M1990 Unspecified osteoarthritis, unspecified site: Secondary | ICD-10-CM | POA: Diagnosis not present

## 2012-09-06 DIAGNOSIS — I10 Essential (primary) hypertension: Secondary | ICD-10-CM | POA: Diagnosis not present

## 2012-09-06 DIAGNOSIS — Z23 Encounter for immunization: Secondary | ICD-10-CM | POA: Diagnosis not present

## 2012-09-06 DIAGNOSIS — E785 Hyperlipidemia, unspecified: Secondary | ICD-10-CM | POA: Diagnosis not present

## 2012-10-27 DIAGNOSIS — M169 Osteoarthritis of hip, unspecified: Secondary | ICD-10-CM | POA: Diagnosis not present

## 2012-11-09 DIAGNOSIS — Z961 Presence of intraocular lens: Secondary | ICD-10-CM | POA: Diagnosis not present

## 2012-11-09 DIAGNOSIS — H401290 Low-tension glaucoma, unspecified eye, stage unspecified: Secondary | ICD-10-CM | POA: Diagnosis not present

## 2012-11-09 DIAGNOSIS — H1045 Other chronic allergic conjunctivitis: Secondary | ICD-10-CM | POA: Diagnosis not present

## 2012-12-15 DIAGNOSIS — M549 Dorsalgia, unspecified: Secondary | ICD-10-CM | POA: Diagnosis not present

## 2012-12-15 DIAGNOSIS — IMO0002 Reserved for concepts with insufficient information to code with codable children: Secondary | ICD-10-CM | POA: Diagnosis not present

## 2012-12-15 DIAGNOSIS — I10 Essential (primary) hypertension: Secondary | ICD-10-CM | POA: Diagnosis not present

## 2012-12-15 DIAGNOSIS — M1990 Unspecified osteoarthritis, unspecified site: Secondary | ICD-10-CM | POA: Diagnosis not present

## 2013-01-06 DIAGNOSIS — M545 Low back pain: Secondary | ICD-10-CM | POA: Diagnosis not present

## 2013-01-18 DIAGNOSIS — J398 Other specified diseases of upper respiratory tract: Secondary | ICD-10-CM | POA: Diagnosis not present

## 2013-01-18 DIAGNOSIS — R918 Other nonspecific abnormal finding of lung field: Secondary | ICD-10-CM | POA: Diagnosis not present

## 2013-01-18 DIAGNOSIS — J9819 Other pulmonary collapse: Secondary | ICD-10-CM | POA: Diagnosis not present

## 2013-01-18 DIAGNOSIS — J984 Other disorders of lung: Secondary | ICD-10-CM | POA: Diagnosis not present

## 2013-02-08 DIAGNOSIS — S43006A Unspecified dislocation of unspecified shoulder joint, initial encounter: Secondary | ICD-10-CM

## 2013-02-08 HISTORY — DX: Unspecified dislocation of unspecified shoulder joint, initial encounter: S43.006A

## 2013-02-18 ENCOUNTER — Emergency Department (HOSPITAL_COMMUNITY): Payer: Medicare Other

## 2013-02-18 ENCOUNTER — Emergency Department (HOSPITAL_COMMUNITY)
Admission: EM | Admit: 2013-02-18 | Discharge: 2013-02-19 | Disposition: A | Discharge status: Home / Self Care | Payer: Medicare Other | Attending: Emergency Medicine | Admitting: Emergency Medicine

## 2013-02-18 ENCOUNTER — Encounter (HOSPITAL_COMMUNITY): Payer: Self-pay

## 2013-02-18 DIAGNOSIS — I10 Essential (primary) hypertension: Secondary | ICD-10-CM | POA: Diagnosis not present

## 2013-02-18 DIAGNOSIS — S43016A Anterior dislocation of unspecified humerus, initial encounter: Secondary | ICD-10-CM | POA: Insufficient documentation

## 2013-02-18 DIAGNOSIS — Z4789 Encounter for other orthopedic aftercare: Secondary | ICD-10-CM | POA: Diagnosis not present

## 2013-02-18 DIAGNOSIS — Z7982 Long term (current) use of aspirin: Secondary | ICD-10-CM | POA: Insufficient documentation

## 2013-02-18 DIAGNOSIS — Z79899 Other long term (current) drug therapy: Secondary | ICD-10-CM | POA: Insufficient documentation

## 2013-02-18 DIAGNOSIS — M129 Arthropathy, unspecified: Secondary | ICD-10-CM | POA: Insufficient documentation

## 2013-02-18 DIAGNOSIS — Z8739 Personal history of other diseases of the musculoskeletal system and connective tissue: Secondary | ICD-10-CM | POA: Diagnosis not present

## 2013-02-18 DIAGNOSIS — IMO0002 Reserved for concepts with insufficient information to code with codable children: Secondary | ICD-10-CM | POA: Diagnosis not present

## 2013-02-18 DIAGNOSIS — Y92009 Unspecified place in unspecified non-institutional (private) residence as the place of occurrence of the external cause: Secondary | ICD-10-CM | POA: Insufficient documentation

## 2013-02-18 DIAGNOSIS — M25519 Pain in unspecified shoulder: Secondary | ICD-10-CM | POA: Diagnosis not present

## 2013-02-18 DIAGNOSIS — Z862 Personal history of diseases of the blood and blood-forming organs and certain disorders involving the immune mechanism: Secondary | ICD-10-CM | POA: Insufficient documentation

## 2013-02-18 DIAGNOSIS — Y9302 Activity, running: Secondary | ICD-10-CM | POA: Insufficient documentation

## 2013-02-18 DIAGNOSIS — Z8639 Personal history of other endocrine, nutritional and metabolic disease: Secondary | ICD-10-CM | POA: Insufficient documentation

## 2013-02-18 DIAGNOSIS — R52 Pain, unspecified: Secondary | ICD-10-CM | POA: Diagnosis not present

## 2013-02-18 DIAGNOSIS — S43036A Inferior dislocation of unspecified humerus, initial encounter: Secondary | ICD-10-CM | POA: Diagnosis not present

## 2013-02-18 DIAGNOSIS — W010XXA Fall on same level from slipping, tripping and stumbling without subsequent striking against object, initial encounter: Secondary | ICD-10-CM | POA: Insufficient documentation

## 2013-02-18 DIAGNOSIS — S43004A Unspecified dislocation of right shoulder joint, initial encounter: Secondary | ICD-10-CM

## 2013-02-18 LAB — COMPREHENSIVE METABOLIC PANEL
ALT: 11 U/L (ref 0–35)
BUN: 10 mg/dL (ref 6–23)
CO2: 24 mEq/L (ref 19–32)
Calcium: 8.5 mg/dL (ref 8.4–10.5)
GFR calc Af Amer: >90 mL/min (ref >90)
GFR calc non Af Amer: 84 mL/min — ABNORMAL LOW (ref >90)
Glucose, Bld: 133 mg/dL — ABNORMAL HIGH (ref 70–99)
Sodium: 134 mEq/L — ABNORMAL LOW (ref 135–145)

## 2013-02-18 LAB — CBC WITH DIFFERENTIAL/PLATELET
Eosinophils Relative: 0 % (ref 0–5)
HCT: 34 % — ABNORMAL LOW (ref 36.0–46.0)
Hemoglobin: 12.1 g/dL (ref 12.0–15.0)
Lymphocytes Relative: 10 % — ABNORMAL LOW (ref 12–46)
Lymphs Abs: 0.8 10*3/uL (ref 0.7–4.0)
MCV: 93.4 fL (ref 78.0–100.0)
Monocytes Absolute: 0.5 10*3/uL (ref 0.1–1.0)
Monocytes Relative: 7 % (ref 3–12)
Platelets: 227 10*3/uL (ref 150–400)
RBC: 3.64 MIL/uL — ABNORMAL LOW (ref 3.87–5.11)
WBC: 7.6 10*3/uL (ref 4.0–10.5)

## 2013-02-18 MED ORDER — PROPOFOL 10 MG/ML IV BOLUS: 0.5000 mg/kg | Freq: Once | INTRAVENOUS | Status: AC

## 2013-02-18 MED ORDER — ONDANSETRON HCL 4 MG/2ML IJ SOLN
INTRAMUSCULAR | Status: AC
  Filled 2013-02-18: qty 2

## 2013-02-18 MED ORDER — MORPHINE SULFATE 4 MG/ML IJ SOLN
4.0000 mg | Freq: Once | INTRAMUSCULAR | Status: AC
  Administered 2013-02-18: 4 mg via INTRAVENOUS
  Filled 2013-02-18: qty 1

## 2013-02-18 MED ORDER — PROPOFOL 10 MG/ML IV BOLUS
INTRAVENOUS | Status: AC
  Administered 2013-02-18: 30.6 mg via INTRAVENOUS
  Filled 2013-02-18: qty 40

## 2013-02-18 MED ORDER — ONDANSETRON HCL 4 MG/2ML IJ SOLN
4.0000 mg | Freq: Once | INTRAMUSCULAR | Status: AC
  Administered 2013-02-18: 4 mg via INTRAVENOUS

## 2013-02-18 NOTE — ED Notes (Addendum)
1910  Pt arrives via EMS from home with right shoulder pain from a fall in the. Pt states she was running through the house and slipped on the hard wood floors and fell landing on her right shoulder.  Pain is 10/10 at this time.  Pt states no heard trauma or LOC.  Pt was given 250 fentanyl prior to arrival and has 22g in Left wrist.  1930  Pt states her last food intake was about 1800

## 2013-02-18 NOTE — ED Provider Notes (Signed)
History    CSN: [id number] Arrival date & time 02/18/13  1903  First MD Initiated Contact with Patient 02/18/13 1904     Chief Complaint  Patient presents with  . Shoulder Injury   (Consider location/radiation/quality/duration/timing/severity/associated sxs/prior Treatment) HPI Comments: Patient presents with a chief complaint of right shoulder pain and inability to move the right shoulder.  She reports that just prior to arrival she slipped on the floor while running through her house and landed on on her right shoulder.  She denies hitting her head.  Denies LOC.  She denies any dizziness or other symptoms prior to the fall.  She is currently not on any blood thinning medications.  She denies any numbness or tingling. Denies neck or back pain.  Denies headache, nausea, vomiting, or vision changes.  She denies any pain of her lower extremities.  She was able to ambulate after the fall.  She denies any prior injury or dislocation of the right shoulder.    The history is provided by the patient.   Past Medical History  Diagnosis Date  . Hypertension   . High cholesterol   . Cervical spondylosis     C3-4 AND C4-5  . Arthritis   . DVT (deep venous thrombosis) 2006  . Chest pain 07/28/2008    H/O, normal stress nuclear EF 78%   Past Surgical History  Procedure Laterality Date  . Hemorrhoid surgery  12/1972  . Foot surgery  06/2005  . Intraocular lens insertion      right eye 10 /16 and left 06/16/2005  . Skin biopsy  01/2007    basal cell carcinoma removed from right lower leg   . Total hip arthroplasty  06/2007    left  . Total knee arthroplasty  10/2007    right   Family History  Problem Relation Age of Onset  . Diabetes Mother   . Heart failure Mother   . Hypertension Father   . Diabetes Father   . Heart disease Father   . Heart attack Father    History  Substance Use Topics  . Smoking status: Never Smoker   . Smokeless tobacco: Never Used  . Alcohol Use: Yes   Comment: BEER A DAY   OB History   Grav Para Term Preterm Abortions TAB SAB Ect Mult Living   2 2 2       2      Review of Systems  Musculoskeletal:       Right shoulder pain  All other systems reviewed and are negative.    Allergies  Review of patient's allergies indicates no known allergies.  Home Medications   Current Outpatient Rx  Name  Route  Sig  Dispense  Refill  . amLODipine-benazepril (LOTREL) 10-20 MG per capsule   Oral   Take 1 capsule by mouth daily.         Marland Kitchen aspirin 81 MG tablet   Oral   Take 81 mg by mouth daily.         . calcium carbonate (OS-CAL) 600 MG TABS   Oral   Take 600 mg by mouth 2 (two) times daily with a meal.         . cholecalciferol (VITAMIN D) 1000 UNITS tablet   Oral   Take 1,000 Units by mouth daily.         . Estradiol (VAGIFEM) 10 MCG TABS   Vaginal   Place 1 tablet (10 mcg total) vaginally 2 (two) times a week.  8 tablet   11   . fish oil-omega-3 fatty acids 1000 MG capsule   Oral   Take 2 g by mouth daily.         Marland Kitchen gemfibrozil (LOPID) 600 MG tablet   Oral   Take 600 mg by mouth 2 (two) times daily before a meal.         . glucosamine-chondroitin 500-400 MG tablet   Oral   Take 1 tablet by mouth 2 (two) times daily.         Marland Kitchen ibuprofen (ADVIL,MOTRIN) 200 MG tablet   Oral   Take 200 mg by mouth as needed.         . Multiple Vitamin (MULTIVITAMIN) tablet   Oral   Take 1 tablet by mouth daily.          BP 149/61  Pulse 80  Temp(Src) 97.7 F (36.5 C) (Oral)  Resp 18  SpO2 97% Physical Exam  Nursing note and vitals reviewed. Constitutional: She appears well-developed and well-nourished.  HENT:  Head: Normocephalic and atraumatic.  Eyes: EOM are normal.  Pupils unequal, which patient reports is baseline  Neck: Normal range of motion. Neck supple. No spinous process tenderness present.  Cardiovascular: Normal rate, regular rhythm, normal heart sounds and intact distal pulses.   Pulses:       Radial pulses are 2+ on the right side, and 2+ on the left side.  Pulmonary/Chest: Effort normal and breath sounds normal.  Musculoskeletal:       Right shoulder: She exhibits decreased range of motion, tenderness, bony tenderness and deformity. She exhibits no swelling, no laceration and normal pulse.       Right elbow: She exhibits normal range of motion, no swelling and no deformity. No tenderness found.       Right wrist: She exhibits normal range of motion, no tenderness, no bony tenderness, no swelling, no effusion and no deformity.       Cervical back: She exhibits normal range of motion, no tenderness, no bony tenderness, no swelling, no edema and no deformity.       Thoracic back: She exhibits normal range of motion, no tenderness, no bony tenderness, no swelling, no edema and no deformity.       Lumbar back: She exhibits normal range of motion, no tenderness, no bony tenderness, no swelling, no edema and no deformity.  Shoulder in extension above her head.  Patient unable to move the shoulder.  Neurological: She is alert. She has normal strength. No cranial nerve deficit or sensory deficit.  Skin: Skin is warm and dry.  Psychiatric: She has a normal mood and affect.    ED Course  Reduction of dislocation Date/Time: 02/18/2013 11:20 PM Performed by: Anne Shutter, Valeria Boza Authorized by: Anne Shutter, Herbert Seta Consent: Verbal consent obtained. written consent obtained. Risks and benefits: risks, benefits and alternatives were discussed Consent given by: patient Patient understanding: patient states understanding of the procedure being performed Patient consent: the patient's understanding of the procedure matches consent given Procedure consent: procedure consent matches procedure scheduled Relevant documents: relevant documents present and verified Imaging studies: imaging studies available Patient identity confirmed: verbally with patient Time out: Immediately prior to procedure a  "time out" was called to verify the correct patient, procedure, equipment, support staff and site/side marked as required. Patient sedated: yes Sedatives: propofol Vitals: Vital signs were monitored during sedation. Patient tolerance: Patient tolerated the procedure well with no immediate complications.   (including critical care time) Labs Reviewed -  No data to display Dg Shoulder Right  02/18/2013   *RADIOLOGY REPORT*  Clinical Data: Postreduction right shoulder.  RIGHT SHOULDER - 2+ VIEW  Comparison: 02/18/2013  Findings: Interval relocation of the right shoulder joint.  The no fractures are suggested.  Coracoclavicular and acromioclavicular spaces are maintained.  IMPRESSION: Interval relocation of the right glenohumeral joint.   Original Report Authenticated By: Burman Nieves, M.D.   Dg Shoulder Right  02/18/2013   *RADIOLOGY REPORT*  Clinical Data: Fall.  Right shoulder pain and decreased range of motion.  RIGHT SHOULDER - 2+ VIEW  Comparison: None.  Findings: Anterior dislocation of the right humeral head is seen. No acute fracture identified.  Mild degenerative changes are seen involving the acromioclavicular joint.  IMPRESSION: Anterior inferior dislocation of the right humeral head.  No fracture identified.   Original Report Authenticated By: Myles Rosenthal, M.D.   No diagnosis found.  MDM  Patient presenting with right shoulder pain and deformity after a mechanical fall.  She denies hitting her head.  No LOC.  No spinal tenderness.  Initial xray showed anterior inferior dislocation of the right humeral head.  Patient neurovascularly intact.  Dislocation successfully reduced under sedation with Dr Manus Gunning.  Patient neurovascularly intact after reduction.  Patient monitored in the ED after sedation and did not have any difficulties.  Patient ambulating in the ED, alert, and orientated prior to discharge.  Patient given a sling immobilizer and referral to Orthopedics.  Pascal Lux [place],  PA-C 02/19/13 1656

## 2013-02-18 NOTE — ED Notes (Addendum)
2235  Break in vital signs is due to pt being gone to X-Ray for a follow up exam after the reduction.    2325  Pt able to tolerate fluids at this time.  Pt is A&O X4.  Will continue to monitor the pt for changes.  2350  Pt sitting on the side of the bed.  Pt states some dizziness upon sitting up but is feeling some better with time.  0005  Pt was able to ambulate with ease and was with steady gait.  Pt states she feels fine and is ready to go home

## 2013-02-19 MED ORDER — HYDROCODONE-ACETAMINOPHEN 5-325 MG PO TABS: 1.0000 | ORAL_TABLET | Freq: Four times a day (QID) | ORAL | Status: DC | PRN

## 2013-02-19 NOTE — ED Provider Notes (Signed)
Mechanical fall on R side without hitting head or LOC.  Pain and deformity to R shoulder.  +2 radial pulse, cardinal hand movements intact, axillary nerve sensation intact. No CTL spine tenderness.  Anterior inferior shoulder dislocation on Xray. Successfully reduced at bedside with PA Anne Shutter.  Procedural sedation Performed by: Glynn Octave Consent: Verbal consent obtained. Risks and benefits: risks, benefits and alternatives were discussed Required items: required blood products, implants, devices, and special equipment available Patient identity confirmed: arm band and provided demographic data Time out: Immediately prior to procedure a "time out" was called to verify the correct patient, procedure, equipment, support staff and site/side marked as required.  Sedation type: moderate (conscious) sedation NPO time confirmed and considedered  Sedatives: PROPOFOL  Physician Time at Bedside: 15  Vitals: Vital signs were monitored during sedation. Cardiac Monitor, pulse oximeter Patient tolerance: Patient tolerated the procedure well with no immediate complications. Comments: Pt with uneventful recovered. Returned to pre-procedural sedation baseline      Glynn Octave, MD 02/19/13 1102

## 2013-02-19 NOTE — ED Provider Notes (Signed)
Medical screening examination/treatment/procedure(s) were conducted as a shared visit with non-physician practitioner(s) and myself.  I personally evaluated the patient during the encounter  See my additional note  Glynn Octave, MD 02/19/13 [phone]

## 2013-02-22 DIAGNOSIS — M25519 Pain in unspecified shoulder: Secondary | ICD-10-CM | POA: Diagnosis not present

## 2013-03-02 DIAGNOSIS — I10 Essential (primary) hypertension: Secondary | ICD-10-CM | POA: Diagnosis not present

## 2013-03-02 DIAGNOSIS — E785 Hyperlipidemia, unspecified: Secondary | ICD-10-CM | POA: Diagnosis not present

## 2013-03-09 DIAGNOSIS — Z1212 Encounter for screening for malignant neoplasm of rectum: Secondary | ICD-10-CM | POA: Diagnosis not present

## 2013-03-09 DIAGNOSIS — I10 Essential (primary) hypertension: Secondary | ICD-10-CM | POA: Diagnosis not present

## 2013-03-09 DIAGNOSIS — IMO0002 Reserved for concepts with insufficient information to code with codable children: Secondary | ICD-10-CM | POA: Diagnosis not present

## 2013-03-09 DIAGNOSIS — E785 Hyperlipidemia, unspecified: Secondary | ICD-10-CM | POA: Diagnosis not present

## 2013-03-09 DIAGNOSIS — M1990 Unspecified osteoarthritis, unspecified site: Secondary | ICD-10-CM | POA: Diagnosis not present

## 2013-03-09 DIAGNOSIS — Z0000 Encounter for general adult medical examination without abnormal findings: Secondary | ICD-10-CM | POA: Diagnosis not present

## 2013-03-09 DIAGNOSIS — Z1331 Encounter for screening for depression: Secondary | ICD-10-CM | POA: Diagnosis not present

## 2013-03-10 DIAGNOSIS — K219 Gastro-esophageal reflux disease without esophagitis: Secondary | ICD-10-CM | POA: Diagnosis not present

## 2013-03-10 DIAGNOSIS — R498 Other voice and resonance disorders: Secondary | ICD-10-CM | POA: Diagnosis not present

## 2013-03-23 DIAGNOSIS — M25519 Pain in unspecified shoulder: Secondary | ICD-10-CM | POA: Diagnosis not present

## 2013-03-23 DIAGNOSIS — S43006A Unspecified dislocation of unspecified shoulder joint, initial encounter: Secondary | ICD-10-CM | POA: Diagnosis not present

## 2013-04-12 DIAGNOSIS — K219 Gastro-esophageal reflux disease without esophagitis: Secondary | ICD-10-CM | POA: Diagnosis not present

## 2013-04-12 DIAGNOSIS — R498 Other voice and resonance disorders: Secondary | ICD-10-CM | POA: Diagnosis not present

## 2013-04-20 DIAGNOSIS — S43006A Unspecified dislocation of unspecified shoulder joint, initial encounter: Secondary | ICD-10-CM | POA: Diagnosis not present

## 2013-04-20 DIAGNOSIS — M25519 Pain in unspecified shoulder: Secondary | ICD-10-CM | POA: Diagnosis not present

## 2013-04-25 DIAGNOSIS — S43006A Unspecified dislocation of unspecified shoulder joint, initial encounter: Secondary | ICD-10-CM | POA: Diagnosis not present

## 2013-04-28 DIAGNOSIS — Z23 Encounter for immunization: Secondary | ICD-10-CM | POA: Diagnosis not present

## 2013-04-29 DIAGNOSIS — S43006A Unspecified dislocation of unspecified shoulder joint, initial encounter: Secondary | ICD-10-CM | POA: Diagnosis not present

## 2013-05-02 ENCOUNTER — Encounter: Payer: Self-pay | Admitting: Gynecology

## 2013-05-02 ENCOUNTER — Ambulatory Visit (INDEPENDENT_AMBULATORY_CARE_PROVIDER_SITE_OTHER): Payer: Medicare Other | Admitting: Gynecology

## 2013-05-02 VITALS — BP 128/74 | Ht 61.5 in | Wt 139.6 lb

## 2013-05-02 DIAGNOSIS — N898 Other specified noninflammatory disorders of vagina: Secondary | ICD-10-CM

## 2013-05-02 DIAGNOSIS — N899 Noninflammatory disorder of vagina, unspecified: Secondary | ICD-10-CM

## 2013-05-02 DIAGNOSIS — Z86718 Personal history of other venous thrombosis and embolism: Secondary | ICD-10-CM | POA: Diagnosis not present

## 2013-05-02 DIAGNOSIS — N952 Postmenopausal atrophic vaginitis: Secondary | ICD-10-CM

## 2013-05-02 DIAGNOSIS — N8111 Cystocele, midline: Secondary | ICD-10-CM

## 2013-05-02 HISTORY — DX: Cystocele, midline: N81.11

## 2013-05-02 NOTE — Patient Instructions (Addendum)
Mammogram A mammogram is an X-ray test to find changes in a woman's breast. You should get a mammogram if:  You are over 77 years of age.   You have risk factors.   Your doctor recommends that you have one.  BEFORE THE TEST  Do not schedule the test the week before your period, especially if your breasts are sore during this time.  On the day of your mammogram:  Wash your breasts and armpits well. After washing, do not put on any deodorant or talcum powder on until after your test.   Eat and drink as you usually do.   Take your medicines as usual.   If you are diabetic and take insulin, make sure you:   Eat before coming for your test.   Take your insulin as usual.   If you cannot keep your appointment, call before the appointment to cancel. Schedule another appointment.  TEST  You will need to undress from the waist up. You will put on a hospital gown.   Your breast will be put on the mammogram machine, and it will press firmly on your breast with a piece of plastic called a compression paddle. This will make your breast flatter so that the machine can X-ray all parts of your breast.   Both breasts will be X-rayed. Each breast will be X-rayed from above and from the side. An X-ray might need to be taken again if the picture is not good enough.   The mammogram will last about 15 to 30 minutes.  AFTER THE TEST Finding out the results of your test Ask when your test results will be ready. Make sure you get your test results. Document Released: 10/24/2008 Document Revised: 07/17/2011 Document Reviewed: 10/24/2008 Exit[employer] Patient Information 2012 [employer]. 

## 2013-05-02 NOTE — Progress Notes (Signed)
[name] Page [birth year] [id number]   History:    79 y.o.  Who has not been seen in the office for the past 2 years. Patient's PCP is Dr. Jacky Kindle who has been doing her lab work and treating her for hypertension and hypercholesterolemia. Patient has a history of DVT in 2006. She also has had a hemorrhoidectomy. She had a left upper placement 2008 and a left knee replacement as well. Patient is complaining of vaginal dryness and irritation. Patient had a normal colonoscopy in 2011. Her last mammogram was normal in 2013. Patient with no prior history of abnormal Pap smears. She is taking her calcium and vitamin D daily. Patient's vaccines are all up-to-date. Bone density study normal 2010.  Past medical history,surgical history, family history and social history were all reviewed and documented in the EPIC chart.  Gynecologic History No LMP recorded. Patient is postmenopausal. Contraception: post menopausal status Last Pap: 2011. Results were: normal Last mammogram: 2013. Results were: normal  Obstetric History OB History  Gravida Para Term Preterm AB SAB TAB Ectopic Multiple Living  2 2 2       2     # Outcome Date GA Lbr Len/2nd Weight Sex Delivery Anes PTL Lv  2 TRM     M SVD  N Y  1 TRM     M SVD  N Y       ROS: A ROS was performed and pertinent positives and negatives are included in the history.  GENERAL: No fevers or chills. HEENT: No change in vision, no earache, sore throat or sinus congestion. NECK: No pain or stiffness. CARDIOVASCULAR: No chest pain or pressure. No palpitations. PULMONARY: No shortness of breath, cough or wheeze. GASTROINTESTINAL: No abdominal pain, nausea, vomiting or diarrhea, melena or bright red blood per rectum. GENITOURINARY: No urinary frequency, urgency, hesitancy or dysuria. MUSCULOSKELETAL: No joint or muscle pain, no back pain, no recent trauma. DERMATOLOGIC: No rash, no itching, no lesions. ENDOCRINE: No polyuria, polydipsia, no heat or cold intolerance.  No recent change in weight. HEMATOLOGICAL: No anemia or easy bruising or bleeding. NEUROLOGIC: No headache, seizures, numbness, tingling or weakness. PSYCHIATRIC: No depression, no loss of interest in normal activity or change in sleep pattern.     Exam: chaperone present  BP 128/74  Ht 5' 1.5" (1.562 m)  Wt 139 lb 9.6 oz (63.322 kg)  BMI 25.95 kg/m2  Body mass index is 25.95 kg/(m^2).  General appearance : Well developed well nourished female. No acute distress HEENT: Neck supple, trachea midline, no carotid bruits, no thyroidmegaly Lungs: Clear to auscultation, no rhonchi or wheezes, or rib retractions  Heart: Regular rate and rhythm, no murmurs or gallops Breast:Examined in sitting and supine position were symmetrical in appearance, no palpable masses or tenderness,  no skin retraction, no nipple inversion, no nipple discharge, no skin discoloration, no axillary or supraclavicular lymphadenopathy Abdomen: no palpable masses or tenderness, no rebound or guarding Extremities: no edema or skin discoloration or tenderness  Pelvic:  Bartholin, Urethra, Skene Glands: Within normal limits             Vagina: No gross lesions or discharge,atrophic changes, first degree cystocele  Cervix: No gross lesions or discharge  Uterus  anteverted, normal size, shape and consistency, non-tender and mobile  Adnexa  Without masses or tenderness  Anus and perineum  normal   Rectovaginal  normal sphincter tone without palpated masses or tenderness             Hemoccult card  provided by her PCP     Assessment/Plan:  79 y.o. female with asymptomatic first-degree cystocele. She is having issues with vaginal atrophy. Patient will be given sample of Hyalo gyn vaginal hydrating gel that she can purchase OTC and used vaginally 2-3 times a week when necessary. Patient was reminded to schedule her mammogram. Patient last bone density study was normal 2010 patient refuses bone density. We discussed the importance  of calcium and vitamin D and regular exercise for osteoporosis prevention. Pap smear not done today the new guidelines were discussed.    Ok Edwards MD, 12:29 PM 05/02/2013

## 2013-05-03 ENCOUNTER — Encounter: Payer: Self-pay | Admitting: Gynecology

## 2013-05-03 DIAGNOSIS — S43006A Unspecified dislocation of unspecified shoulder joint, initial encounter: Secondary | ICD-10-CM | POA: Diagnosis not present

## 2013-05-10 DIAGNOSIS — S5290XA Unspecified fracture of unspecified forearm, initial encounter for closed fracture: Secondary | ICD-10-CM | POA: Diagnosis not present

## 2013-05-12 DIAGNOSIS — S5290XA Unspecified fracture of unspecified forearm, initial encounter for closed fracture: Secondary | ICD-10-CM | POA: Diagnosis not present

## 2013-05-17 DIAGNOSIS — S5290XA Unspecified fracture of unspecified forearm, initial encounter for closed fracture: Secondary | ICD-10-CM | POA: Diagnosis not present

## 2013-05-18 DIAGNOSIS — M25519 Pain in unspecified shoulder: Secondary | ICD-10-CM | POA: Diagnosis not present

## 2013-05-18 DIAGNOSIS — S5290XA Unspecified fracture of unspecified forearm, initial encounter for closed fracture: Secondary | ICD-10-CM | POA: Diagnosis not present

## 2013-05-26 DIAGNOSIS — Z1231 Encounter for screening mammogram for malignant neoplasm of breast: Secondary | ICD-10-CM | POA: Diagnosis not present

## 2013-05-26 DIAGNOSIS — H26499 Other secondary cataract, unspecified eye: Secondary | ICD-10-CM | POA: Diagnosis not present

## 2013-05-26 DIAGNOSIS — Z961 Presence of intraocular lens: Secondary | ICD-10-CM | POA: Diagnosis not present

## 2013-05-26 DIAGNOSIS — H401290 Low-tension glaucoma, unspecified eye, stage unspecified: Secondary | ICD-10-CM | POA: Diagnosis not present

## 2013-05-26 DIAGNOSIS — D487 Neoplasm of uncertain behavior of other specified sites: Secondary | ICD-10-CM | POA: Diagnosis not present

## 2013-05-26 DIAGNOSIS — H43819 Vitreous degeneration, unspecified eye: Secondary | ICD-10-CM | POA: Diagnosis not present

## 2013-05-26 DIAGNOSIS — H4720 Unspecified optic atrophy: Secondary | ICD-10-CM | POA: Diagnosis not present

## 2013-05-26 DIAGNOSIS — H1045 Other chronic allergic conjunctivitis: Secondary | ICD-10-CM | POA: Diagnosis not present

## 2013-05-26 DIAGNOSIS — H04129 Dry eye syndrome of unspecified lacrimal gland: Secondary | ICD-10-CM | POA: Diagnosis not present

## 2013-05-27 DIAGNOSIS — S5290XA Unspecified fracture of unspecified forearm, initial encounter for closed fracture: Secondary | ICD-10-CM | POA: Diagnosis not present

## 2013-05-31 DIAGNOSIS — S5290XA Unspecified fracture of unspecified forearm, initial encounter for closed fracture: Secondary | ICD-10-CM | POA: Diagnosis not present

## 2013-06-01 ENCOUNTER — Encounter: Payer: Self-pay | Admitting: Gynecology

## 2013-06-02 DIAGNOSIS — S5290XA Unspecified fracture of unspecified forearm, initial encounter for closed fracture: Secondary | ICD-10-CM | POA: Diagnosis not present

## 2013-06-02 DIAGNOSIS — H26499 Other secondary cataract, unspecified eye: Secondary | ICD-10-CM | POA: Diagnosis not present

## 2013-07-18 ENCOUNTER — Encounter: Payer: Self-pay | Admitting: Cardiovascular Disease

## 2013-07-19 DIAGNOSIS — J8410 Pulmonary fibrosis, unspecified: Secondary | ICD-10-CM | POA: Diagnosis not present

## 2013-07-19 DIAGNOSIS — R918 Other nonspecific abnormal finding of lung field: Secondary | ICD-10-CM | POA: Diagnosis not present

## 2013-07-19 DIAGNOSIS — M7989 Other specified soft tissue disorders: Secondary | ICD-10-CM | POA: Diagnosis not present

## 2013-07-19 DIAGNOSIS — J398 Other specified diseases of upper respiratory tract: Secondary | ICD-10-CM | POA: Diagnosis not present

## 2013-07-19 DIAGNOSIS — J9819 Other pulmonary collapse: Secondary | ICD-10-CM | POA: Diagnosis not present

## 2013-07-19 DIAGNOSIS — R911 Solitary pulmonary nodule: Secondary | ICD-10-CM | POA: Diagnosis not present

## 2013-07-20 DIAGNOSIS — J9809 Other diseases of bronchus, not elsewhere classified: Secondary | ICD-10-CM | POA: Insufficient documentation

## 2013-07-27 DIAGNOSIS — R599 Enlarged lymph nodes, unspecified: Secondary | ICD-10-CM | POA: Diagnosis not present

## 2013-07-27 DIAGNOSIS — Z79899 Other long term (current) drug therapy: Secondary | ICD-10-CM | POA: Diagnosis not present

## 2013-07-27 DIAGNOSIS — R918 Other nonspecific abnormal finding of lung field: Secondary | ICD-10-CM | POA: Diagnosis not present

## 2013-07-27 DIAGNOSIS — I10 Essential (primary) hypertension: Secondary | ICD-10-CM | POA: Diagnosis not present

## 2013-07-27 DIAGNOSIS — R222 Localized swelling, mass and lump, trunk: Secondary | ICD-10-CM | POA: Diagnosis not present

## 2013-07-27 DIAGNOSIS — E785 Hyperlipidemia, unspecified: Secondary | ICD-10-CM | POA: Diagnosis not present

## 2013-07-27 DIAGNOSIS — J984 Other disorders of lung: Secondary | ICD-10-CM | POA: Diagnosis not present

## 2013-08-23 DIAGNOSIS — R0609 Other forms of dyspnea: Secondary | ICD-10-CM | POA: Diagnosis not present

## 2013-08-23 DIAGNOSIS — R918 Other nonspecific abnormal finding of lung field: Secondary | ICD-10-CM | POA: Diagnosis not present

## 2013-08-23 DIAGNOSIS — J9819 Other pulmonary collapse: Secondary | ICD-10-CM | POA: Diagnosis not present

## 2013-08-23 DIAGNOSIS — R911 Solitary pulmonary nodule: Secondary | ICD-10-CM | POA: Diagnosis not present

## 2013-08-23 DIAGNOSIS — R222 Localized swelling, mass and lump, trunk: Secondary | ICD-10-CM | POA: Diagnosis not present

## 2013-08-23 DIAGNOSIS — H0589 Other disorders of orbit: Secondary | ICD-10-CM | POA: Diagnosis not present

## 2013-08-23 DIAGNOSIS — R948 Abnormal results of function studies of other organs and systems: Secondary | ICD-10-CM | POA: Diagnosis not present

## 2013-08-23 DIAGNOSIS — R0989 Other specified symptoms and signs involving the circulatory and respiratory systems: Secondary | ICD-10-CM | POA: Diagnosis not present

## 2013-08-23 DIAGNOSIS — R930 Abnormal findings on diagnostic imaging of skull and head, not elsewhere classified: Secondary | ICD-10-CM | POA: Diagnosis not present

## 2013-08-23 DIAGNOSIS — J988 Other specified respiratory disorders: Secondary | ICD-10-CM | POA: Diagnosis not present

## 2013-08-23 DIAGNOSIS — J398 Other specified diseases of upper respiratory tract: Secondary | ICD-10-CM | POA: Diagnosis not present

## 2013-09-05 DIAGNOSIS — E785 Hyperlipidemia, unspecified: Secondary | ICD-10-CM | POA: Diagnosis not present

## 2013-09-05 DIAGNOSIS — IMO0002 Reserved for concepts with insufficient information to code with codable children: Secondary | ICD-10-CM | POA: Diagnosis not present

## 2013-09-05 DIAGNOSIS — I10 Essential (primary) hypertension: Secondary | ICD-10-CM | POA: Diagnosis not present

## 2013-09-05 DIAGNOSIS — J984 Other disorders of lung: Secondary | ICD-10-CM | POA: Diagnosis not present

## 2013-09-08 DIAGNOSIS — Z01818 Encounter for other preprocedural examination: Secondary | ICD-10-CM | POA: Diagnosis not present

## 2013-09-08 DIAGNOSIS — H0589 Other disorders of orbit: Secondary | ICD-10-CM | POA: Diagnosis not present

## 2013-09-19 DIAGNOSIS — H0589 Other disorders of orbit: Secondary | ICD-10-CM | POA: Diagnosis not present

## 2013-10-10 DIAGNOSIS — R82998 Other abnormal findings in urine: Secondary | ICD-10-CM | POA: Diagnosis not present

## 2013-10-10 DIAGNOSIS — M549 Dorsalgia, unspecified: Secondary | ICD-10-CM | POA: Diagnosis not present

## 2013-10-18 DIAGNOSIS — H0589 Other disorders of orbit: Secondary | ICD-10-CM | POA: Diagnosis not present

## 2013-10-18 DIAGNOSIS — Z86718 Personal history of other venous thrombosis and embolism: Secondary | ICD-10-CM | POA: Diagnosis not present

## 2013-10-18 DIAGNOSIS — H05119 Granuloma of unspecified orbit: Secondary | ICD-10-CM | POA: Diagnosis not present

## 2013-10-18 DIAGNOSIS — M6289 Other specified disorders of muscle: Secondary | ICD-10-CM | POA: Diagnosis not present

## 2013-10-18 DIAGNOSIS — I10 Essential (primary) hypertension: Secondary | ICD-10-CM | POA: Diagnosis not present

## 2013-10-18 DIAGNOSIS — Z79899 Other long term (current) drug therapy: Secondary | ICD-10-CM | POA: Diagnosis not present

## 2013-10-18 DIAGNOSIS — H059 Unspecified disorder of orbit: Secondary | ICD-10-CM | POA: Diagnosis not present

## 2013-11-15 DIAGNOSIS — D869 Sarcoidosis, unspecified: Secondary | ICD-10-CM | POA: Diagnosis not present

## 2013-11-15 DIAGNOSIS — J398 Other specified diseases of upper respiratory tract: Secondary | ICD-10-CM | POA: Diagnosis not present

## 2013-11-15 DIAGNOSIS — J9819 Other pulmonary collapse: Secondary | ICD-10-CM | POA: Diagnosis not present

## 2013-11-23 DIAGNOSIS — M76899 Other specified enthesopathies of unspecified lower limb, excluding foot: Secondary | ICD-10-CM | POA: Diagnosis not present

## 2013-12-01 DIAGNOSIS — D869 Sarcoidosis, unspecified: Secondary | ICD-10-CM | POA: Diagnosis not present

## 2013-12-01 DIAGNOSIS — H26499 Other secondary cataract, unspecified eye: Secondary | ICD-10-CM | POA: Diagnosis not present

## 2013-12-01 DIAGNOSIS — Z961 Presence of intraocular lens: Secondary | ICD-10-CM | POA: Diagnosis not present

## 2013-12-01 DIAGNOSIS — H401290 Low-tension glaucoma, unspecified eye, stage unspecified: Secondary | ICD-10-CM | POA: Diagnosis not present

## 2013-12-01 DIAGNOSIS — H4720 Unspecified optic atrophy: Secondary | ICD-10-CM | POA: Diagnosis not present

## 2013-12-30 DIAGNOSIS — M25519 Pain in unspecified shoulder: Secondary | ICD-10-CM | POA: Diagnosis not present

## 2013-12-30 DIAGNOSIS — M67919 Unspecified disorder of synovium and tendon, unspecified shoulder: Secondary | ICD-10-CM | POA: Diagnosis not present

## 2014-01-05 DIAGNOSIS — D869 Sarcoidosis, unspecified: Secondary | ICD-10-CM | POA: Diagnosis not present

## 2014-01-10 DIAGNOSIS — M25519 Pain in unspecified shoulder: Secondary | ICD-10-CM | POA: Diagnosis not present

## 2014-01-16 DIAGNOSIS — D869 Sarcoidosis, unspecified: Secondary | ICD-10-CM | POA: Diagnosis not present

## 2014-01-16 DIAGNOSIS — M719 Bursopathy, unspecified: Secondary | ICD-10-CM | POA: Diagnosis not present

## 2014-01-16 DIAGNOSIS — I10 Essential (primary) hypertension: Secondary | ICD-10-CM | POA: Diagnosis not present

## 2014-01-16 DIAGNOSIS — R42 Dizziness and giddiness: Secondary | ICD-10-CM | POA: Diagnosis not present

## 2014-01-16 DIAGNOSIS — R82998 Other abnormal findings in urine: Secondary | ICD-10-CM | POA: Diagnosis not present

## 2014-01-16 DIAGNOSIS — M67919 Unspecified disorder of synovium and tendon, unspecified shoulder: Secondary | ICD-10-CM | POA: Diagnosis not present

## 2014-01-16 DIAGNOSIS — R5381 Other malaise: Secondary | ICD-10-CM | POA: Diagnosis not present

## 2014-01-16 DIAGNOSIS — R1032 Left lower quadrant pain: Secondary | ICD-10-CM | POA: Diagnosis not present

## 2014-01-16 DIAGNOSIS — R5383 Other fatigue: Secondary | ICD-10-CM | POA: Diagnosis not present

## 2014-01-16 DIAGNOSIS — Z79899 Other long term (current) drug therapy: Secondary | ICD-10-CM | POA: Diagnosis not present

## 2014-01-16 DIAGNOSIS — IMO0002 Reserved for concepts with insufficient information to code with codable children: Secondary | ICD-10-CM | POA: Diagnosis not present

## 2014-01-30 ENCOUNTER — Encounter: Payer: Self-pay | Admitting: Internal Medicine

## 2014-01-30 ENCOUNTER — Ambulatory Visit (INDEPENDENT_AMBULATORY_CARE_PROVIDER_SITE_OTHER): Payer: Medicare Other | Admitting: Internal Medicine

## 2014-01-30 ENCOUNTER — Encounter (INDEPENDENT_AMBULATORY_CARE_PROVIDER_SITE_OTHER): Payer: Self-pay

## 2014-01-30 VITALS — BP 148/70 | HR 75 | Ht 61.5 in | Wt 140.2 lb

## 2014-01-30 DIAGNOSIS — L298 Other pruritus: Secondary | ICD-10-CM

## 2014-01-30 DIAGNOSIS — D869 Sarcoidosis, unspecified: Secondary | ICD-10-CM | POA: Diagnosis not present

## 2014-01-30 DIAGNOSIS — J3489 Other specified disorders of nose and nasal sinuses: Secondary | ICD-10-CM | POA: Diagnosis not present

## 2014-01-30 DIAGNOSIS — J9819 Other pulmonary collapse: Secondary | ICD-10-CM

## 2014-01-30 DIAGNOSIS — L2989 Other pruritus: Secondary | ICD-10-CM

## 2014-01-30 HISTORY — DX: Other pruritus: L29.8

## 2014-01-30 HISTORY — DX: Other pruritus: L29.89

## 2014-01-30 HISTORY — DX: Other pulmonary collapse: J98.19

## 2014-01-30 NOTE — Patient Instructions (Signed)
#  Nasal itching  - refer [facility]  #Sarcoidosis and right middle lobe syndrom  - continue prednisone taper schedule per [facility] but do not reduce below 5mg  per day  - finish up with Dr Kerin Ransom and [facility] - keep followup with Dr Ouida Sills rheumatologist  #Followup  - August 2015 with me to regroup; will consider EKG/ECHO at that time  -  - office spirometry at followup

## 2014-01-30 NOTE — Progress Notes (Signed)
Subjective:    Patient ID: [name] Page, female    DOB: July 05, 1931, 80 y.o.   MRN: [id number]  HPI    OV 01/30/2014  Chief Complaint  Patient presents with  . Pulmonary Consult    Referred by Dr. Ouida Sills for Sarcoidosis.    December 92, 1320: 80 year old white female who is generall active at gym and exercises. Referred by Dr. Maureen Ralphs for abnormal imaging. She is s/p left hip replacement inNov 2009 but currently needs bursectomy versus ligament repair. So, she has preop CXR 07/19/2010 which showed RML atelectasis with associated soft tisse fullness. REsulted in CT chest 07/23/2010 which showed RML partial collapse with surrounding fullness, 77mm Left apical nodule image 11, LUL 21mm nodule image 10, soft tissue fullness at right hilum and borderline hilar, subcarinal nodes. Ther is one calcified nodule  In terms of chest symptoms: In April 2011 she went to [place]. [place] on way [place] picked up a "nasty cold" in plane. Rx with 2 rounds of prednisone and 1 round of abx and it cleared up. In June 2011 she had routine cxr at Dr. Reynaldo Minium office - she believes it might have had some abnormality NOS. Then, i In first part of Nov 2011 was in [place]. Soon after return on 06/27/2010 she noticed dry irritating cough that has persisted. Cough present only at day time. Relieved by throat lozenges. Denies associated sinus drainage but admits to ACE inhibitor intake since 2007. Also admits to mild GERD for which she is take occassional zantac (gerd made worse by chocolates and spices). Denies dyspnea, wheezing, sputum, fever, chills, weight loss. In terms of travel, she was born in [place]. Spent lot of time of [place] and [place]. Moved to [place] 20 years ago. RECL BRONCH and CHANGE ACE INHIBITOR TO BENICAR   Aug 01, 2010: Foley RML orifice. No endobronhcial lesion. Lavage - neutrophils. Endobrhnical bx and micro - non-diagnostic. REC: 3 week prednisone and 1 week ABX   September 04, 2010: Followup RML  collapse. AFter bronch cough worsenend. Advised 7 days abx and 3 week prednisone. COmpleted prednisone 1 week ago. AT that point cough resolved "almost completely". Was feeling great. This past weekend, grandkids sick and now she has runny nose, sneezing, fatigue and some dry cough    ..........................................  OV 01/30/14 - this is a NEW VISIT - been > 3 years since I last saw er. In 2011 diagnosed with new RML collapse syndrome with focal obstruction at entrance of RML  with non diagnostic bronch and referred to [place]. She returns now - has diagnosis of sarcoid pulmnary and orbit. Feels with her 82 years better to be followed locally  She has detailed notes with her. Looks like   -  10/09/10 had a bronch at [facility] showed same findings as mine did and was non-diagnosti  - 08/02/11 CT showed complete collapse RML and narrowing of LUL  - 01/20/11 - same as above  - dec 2013 - repeat bronch - stenotic RML and LUL per patient - June 2014 0 Ct unchanged  - Dec 2014 - ct unchanged - Jan 2015 - PET scan showed positive lymph nodes in orbit in addition to lung problems , MRI confirmed abnormalities in optic nerve - 10/18/13 - [facility] Dr Algis Greenhouse - orbital bx - SARCOID +  - 11/15/13 - Dr Kerin Ransom started prednisone 40mg  x 1 month, 30mg  per day x 2nd month   - 02/14/14 - saw Dr Ouida Sills  local rheumatologist in [place]. On prednisone 10mg  per day at this point  Of note, her main pulmonary problem is cough and it resolved completely with prednsone though prednisone is  Causing some anxiety. She does notice that when she tries to come off prednisone cough returns. Her next taper schedule is for 5mg  per day  She also has some nasal ithcing  She is due for MRI Orbit in July 2015 at [facility]    has a past medical history of Hypertension; High cholesterol; Cervical spondylosis; Arthritis; DVT (deep venous thrombosis) (2006); Chest pain (07/28/2008); Dislocation closed,  shoulder (7/14); and Collapsed lung ( 12/11).   has past surgical history that includes Hemorrhoid surgery (12/1972); Foot surgery (06/2005); Intraocular lens insertion; Skin biopsy (01/2007); Total hip arthroplasty (06/2007); and Total knee arthroplasty (10/2007).   Past Medical History:  Reviewed history from 07/30/2010 and no changes required.  Hyperlipidemia  Hypertension  Deep Vein Thrombosis/Phlebitis, 38yrs ago with birth of son, and again in 2006  - 1966 "calf of left leg" following labor in [place] in hospital extra week. No Rx.  - 2006 was "deep vein" followng foot surgery - took coumadin for 6 months  - not on life long anticoagulation  Left hip pain  - repeated cortisone shots  Stress Test 12-09-Dr. Cathie Olden normal   Total left hip replacement 11-08,  total Knee, right replacement 3-09  Lens replacement-both 05-2005  Hemorrhoidectomy-12-1972  Left foot surgery -06-2005  Basal cell carcinoma biopsied and removed, RL leg 01-2007    Review of Systems  Constitutional: Negative for fever and unexpected weight change.  HENT: Positive for trouble swallowing. Negative for congestion, dental problem, ear pain, nosebleeds, postnasal drip, rhinorrhea, sinus pressure, sneezing and sore throat.   Eyes: Negative for redness and itching.  Respiratory: Positive for chest tightness. Negative for cough, shortness of breath and wheezing.   Cardiovascular: Negative for palpitations and leg swelling.  Gastrointestinal: Negative for nausea and vomiting.  Genitourinary: Negative for dysuria.  Musculoskeletal: Negative for joint swelling.  Skin: Negative for rash.  Neurological: Negative for headaches.  Hematological: Does not bruise/bleed easily.  Psychiatric/Behavioral: Negative for dysphoric mood. The patient is not nervous/anxious.        Objective:   Physical Exam  Vitals reviewed. Constitutional: She is oriented to person, place, and time. She appears well-developed and  well-nourished. No distress.  HENT:  Head: Normocephalic and atraumatic.  Right Ear: External ear normal.  Left Ear: External ear normal.  Mouth/Throat: Oropharynx is clear and moist. No oropharyngeal exudate.  Eyes: Conjunctivae and EOM are normal. Pupils are equal, round, and reactive to light. Right eye exhibits no discharge. Left eye exhibits no discharge. No scleral icterus.  Neck: Normal range of motion. Neck supple. No JVD present. No tracheal deviation present. No thyromegaly present.  Cardiovascular: Normal rate, regular rhythm, normal heart sounds and intact distal pulses.  Exam reveals no gallop and no friction rub.   No murmur heard. Pulmonary/Chest: Effort normal and breath sounds normal. No respiratory distress. She has no wheezes. She has no rales. She exhibits no tenderness.  Abdominal: Soft. Bowel sounds are normal. She exhibits no distension and no mass. There is no tenderness. There is no rebound and no guarding.  Musculoskeletal: Normal range of motion. She exhibits no edema and no tenderness.  Lymphadenopathy:    She has no cervical adenopathy.  Neurological: She is alert and oriented to person, place, and time. She has normal reflexes. No cranial nerve deficit. She exhibits  normal muscle tone. Coordination normal.  Skin: Skin is warm and dry. No rash noted. She is not diaphoretic. No erythema. No pallor.  Psychiatric: She has a normal mood and affect. Her behavior is normal. Judgment and thought content normal.    Filed Vitals:   01/30/14 1139  BP: 148/70  Pulse: 75  Height: 5' 1.5" (1.562 m)  Weight: 140 lb 3.2 oz (63.594 kg)  SpO2: 95%         Assessment & Plan:  #Nasal itching  - refer [facility]  #Sarcoidosis and right middle lobe syndrom  - continue prednisone taper schedule per [facility] but do not reduce below 5mg  per day  - finish up with Dr Kerin Ransom and [facility] - keep followup with Dr Ouida Sills rheumatologist  #Followup  - August 2015 with me  to regroup; will consider EKG/ECHO at that time  -  - office spirometry at followup

## 2014-02-03 DIAGNOSIS — R498 Other voice and resonance disorders: Secondary | ICD-10-CM | POA: Diagnosis not present

## 2014-02-03 DIAGNOSIS — D869 Sarcoidosis, unspecified: Secondary | ICD-10-CM | POA: Diagnosis not present

## 2014-02-14 DIAGNOSIS — R918 Other nonspecific abnormal finding of lung field: Secondary | ICD-10-CM | POA: Diagnosis not present

## 2014-02-14 DIAGNOSIS — H05119 Granuloma of unspecified orbit: Secondary | ICD-10-CM | POA: Diagnosis not present

## 2014-02-14 DIAGNOSIS — J9819 Other pulmonary collapse: Secondary | ICD-10-CM | POA: Diagnosis not present

## 2014-02-14 DIAGNOSIS — H0589 Other disorders of orbit: Secondary | ICD-10-CM | POA: Diagnosis not present

## 2014-02-14 DIAGNOSIS — D869 Sarcoidosis, unspecified: Secondary | ICD-10-CM | POA: Diagnosis not present

## 2014-02-14 DIAGNOSIS — H059 Unspecified disorder of orbit: Secondary | ICD-10-CM | POA: Diagnosis not present

## 2014-02-21 DIAGNOSIS — Z13820 Encounter for screening for osteoporosis: Secondary | ICD-10-CM | POA: Diagnosis not present

## 2014-02-21 DIAGNOSIS — D869 Sarcoidosis, unspecified: Secondary | ICD-10-CM | POA: Diagnosis not present

## 2014-02-23 ENCOUNTER — Emergency Department (INDEPENDENT_AMBULATORY_CARE_PROVIDER_SITE_OTHER)
Admission: EM | Admit: 2014-02-23 | Discharge: 2014-02-23 | Disposition: A | Discharge status: Home / Self Care | Payer: Medicare Other | Source: Home / Self Care | Attending: Family Medicine | Admitting: Family Medicine

## 2014-02-23 ENCOUNTER — Encounter (HOSPITAL_COMMUNITY): Payer: Self-pay | Admitting: Emergency Medicine

## 2014-02-23 DIAGNOSIS — S61209A Unspecified open wound of unspecified finger without damage to nail, initial encounter: Secondary | ICD-10-CM

## 2014-02-23 DIAGNOSIS — S61219A Laceration without foreign body of unspecified finger without damage to nail, initial encounter: Secondary | ICD-10-CM

## 2014-02-23 MED ORDER — CEPHALEXIN 500 MG PO CAPS: 500.0000 mg | ORAL_CAPSULE | Freq: Two times a day (BID) | ORAL | Status: DC

## 2014-02-23 NOTE — Discharge Instructions (Signed)
The cut to your finger removed both layers of skin. This will make healing a much slower process. The dermabond applied to your finger is water tight and will promote healing. This will take 1-2 weeks to come off. Please use the brace if you are going to be particularly active and might traumatize the finger. Please only start the Keflex if your finger develops signs of infection Have a great day.

## 2014-02-23 NOTE — ED Provider Notes (Signed)
CSN: [id number]     Arrival date & time 02/23/14  1245 History   First MD Initiated Contact with Patient 02/23/14 1300     Chief Complaint  Patient presents with  . Extremity Laceration   (Consider location/radiation/quality/duration/timing/severity/associated sxs/prior Treatment) HPI  L index finger laceration: occurred 2 days ago. Cutting lettuce and cut tip of the finger. Applied bandaid, neosporin, and pressure to finger. Continues to bleed which is why pt came into UC today. Deneis discoloration, fevers, change in sensation or function of the finger.    Past Medical History  Diagnosis Date  . Hypertension   . High cholesterol   . Cervical spondylosis     C3-4 AND C4-5  . Arthritis   . DVT (deep venous thrombosis) 2006  . Chest pain 07/28/2008    H/O, normal stress nuclear EF 78%  . Dislocation closed, shoulder 7/14  . Collapsed lung  12/11    being treated at [facility] - in left lung   Past Surgical History  Procedure Laterality Date  . Hemorrhoid surgery  12/1972  . Foot surgery  06/2005  . Intraocular lens insertion      right eye 10 /16 and left 06/16/2005  . Skin biopsy  01/2007    basal cell carcinoma removed from right lower leg   . Total hip arthroplasty  06/2007    left  . Total knee arthroplasty  10/2007    right   Family History  Problem Relation Age of Onset  . Diabetes Mother   . Heart failure Mother   . Hypertension Father   . Diabetes Father   . Heart disease Father   . Heart attack Father    History  Substance Use Topics  . Smoking status: Never Smoker   . Smokeless tobacco: Never Used  . Alcohol Use: Yes     Comment: BEER/glass of wine A DAY   OB History   Grav Para Term Preterm Abortions TAB SAB Ect Mult Living   2 2 2       2      Review of Systems Per HPI with all other pertinent systems negative.   Allergies  Review of patient's allergies indicates no known allergies.  Home Medications   Prior to Admission medications   Medication  Sig Start Date End Date Taking? Authorizing Provider  gemfibrozil (LOPID) 600 MG tablet Take 600 mg by mouth 2 (two) times daily before a meal.   Yes Historical Provider, MD  levothyroxine (SYNTHROID, LEVOTHROID) 25 MCG tablet Take 1 tablet by mouth daily.   Yes Historical Provider, MD  predniSONE (DELTASONE) 10 MG tablet Take by mouth daily. 20mg  x 1 month, then 10mg  x 1 month   Yes Historical Provider, MD  amLODipine-benazepril (LOTREL) 10-20 MG per capsule Take 1 capsule by mouth every morning.     Historical Provider, MD  aspirin 81 MG tablet Take 81 mg by mouth every morning.     Historical Provider, MD  azelastine (OPTIVAR) 0.05 % ophthalmic solution Place 1 drop into both eyes every morning.    Historical Provider, MD  bisacodyl (DULCOLAX) 5 MG EC tablet Take 5 mg by mouth daily as needed for constipation. For constipation    Historical Provider, MD  Calcium Carbonate-Vitamin D (CALCIUM + D PO) Take 1 tablet by mouth 2 (two) times daily.    Historical Provider, MD  cholecalciferol (VITAMIN D) 1000 UNITS tablet Take 1,000 Units by mouth daily.    Historical Provider, MD  ibuprofen (ADVIL,MOTRIN) 200  MG tablet Take 600 mg by mouth daily as needed. For pain    Historical Provider, MD  Multiple Vitamin (MULTIVITAMIN WITH MINERALS) TABS Take 1 tablet by mouth daily.    Historical Provider, MD   BP 176/76  Pulse 67  Temp(Src) 98.7 F (37.1 C) (Oral)  Resp 16  Ht 5' 1.75" (1.568 m)  SpO2 97% Physical Exam  Constitutional: She is oriented to person, place, and time. She appears well-developed and well-nourished.  HENT:  Head: Normocephalic and atraumatic.  Eyes: EOM are normal. Pupils are equal, round, and reactive to light.  Neck: Normal range of motion.  Cardiovascular: Normal rate, normal heart sounds and intact distal pulses.  Exam reveals no gallop.   No murmur heard. Pulmonary/Chest: Effort normal and breath sounds normal.  Abdominal: Soft. She exhibits no distension.   Musculoskeletal: Normal range of motion. She exhibits no tenderness.  Neurological: She is alert and oriented to person, place, and time.  Skin:  L finger 1.2cmx1cm laceration along the lateral edge near the tip of the finger. Epidermis and dermis layers removed. No surounding erythema or induration  Psychiatric: She has a normal mood and affect. Her behavior is normal. Judgment and thought content normal.    ED Course  Procedures (including critical care time) Labs Review Labs Reviewed - No data to display  Imaging Review No results found.  After obtaining informed verbal consent the finger was placed in cold water to promot vasoconstriction and decrease pain. The finger was cleaned in the typical steril manor and then debrided of any dead and necrotic tissue. H2O2 and then sterile water were used to further clean the wound. 3 applications of dermabond were applied. Hemostasis was reached adn a good seal over the wound.    MDM   1. Finger laceration, initial encounter    Uninfected 2 day old laceration involving complete removal of both skin layers. Neurovascularly intact. Treatment options discussed including wet to dry vs cleaning and dermabond application. Pt decided on dermabond. Procedure tolerated well as above. Keflex Rx given for tp to fill if she develops s/s of infection. Finger brace provided to be used for secondary protection in high activity excursions.  Precautions given and all questions answered  Linna Darner, MD Family Medicine 02/23/2014, 1:50 PM      Waldemar Dickens, MD 02/23/14 1350

## 2014-02-23 NOTE — ED Notes (Signed)
Pt reports laceration/avulsion to tip of left index finger onset Tuesday States she was cooking and sliced finger Still bleeding; denies pain Last tetanus w/in a year Alert w/no signs of acute distress.

## 2014-03-06 DIAGNOSIS — I10 Essential (primary) hypertension: Secondary | ICD-10-CM | POA: Diagnosis not present

## 2014-03-06 DIAGNOSIS — Z79899 Other long term (current) drug therapy: Secondary | ICD-10-CM | POA: Diagnosis not present

## 2014-03-06 DIAGNOSIS — E785 Hyperlipidemia, unspecified: Secondary | ICD-10-CM | POA: Diagnosis not present

## 2014-03-08 DIAGNOSIS — Z1212 Encounter for screening for malignant neoplasm of rectum: Secondary | ICD-10-CM | POA: Diagnosis not present

## 2014-03-13 DIAGNOSIS — Z1331 Encounter for screening for depression: Secondary | ICD-10-CM | POA: Diagnosis not present

## 2014-03-13 DIAGNOSIS — D869 Sarcoidosis, unspecified: Secondary | ICD-10-CM | POA: Diagnosis not present

## 2014-03-13 DIAGNOSIS — Z6825 Body mass index (BMI) 25.0-25.9, adult: Secondary | ICD-10-CM | POA: Diagnosis not present

## 2014-03-13 DIAGNOSIS — Z0000 Encounter for general adult medical examination without abnormal findings: Secondary | ICD-10-CM | POA: Diagnosis not present

## 2014-03-13 DIAGNOSIS — Z23 Encounter for immunization: Secondary | ICD-10-CM | POA: Diagnosis not present

## 2014-03-13 DIAGNOSIS — I10 Essential (primary) hypertension: Secondary | ICD-10-CM | POA: Diagnosis not present

## 2014-03-13 DIAGNOSIS — E785 Hyperlipidemia, unspecified: Secondary | ICD-10-CM | POA: Diagnosis not present

## 2014-03-13 DIAGNOSIS — M1990 Unspecified osteoarthritis, unspecified site: Secondary | ICD-10-CM | POA: Diagnosis not present

## 2014-03-27 ENCOUNTER — Ambulatory Visit (INDEPENDENT_AMBULATORY_CARE_PROVIDER_SITE_OTHER): Payer: Medicare Other | Admitting: Internal Medicine

## 2014-03-27 ENCOUNTER — Encounter: Payer: Self-pay | Admitting: Internal Medicine

## 2014-03-27 VITALS — BP 118/74 | HR 66 | Ht 61.0 in | Wt 143.0 lb

## 2014-03-27 DIAGNOSIS — D869 Sarcoidosis, unspecified: Secondary | ICD-10-CM

## 2014-03-27 MED ORDER — PREDNISONE 5 MG PO TABS: 5.0000 mg | ORAL_TABLET | Freq: Every day | ORAL | Status: DC

## 2014-03-27 NOTE — Patient Instructions (Addendum)
#  Sarcoidosis and right middle lobe syndrom  - continue prednisone at  5mg  per day; because this appears to be miniumum dose that strikes balance between side effects and symptom control - keep followup with Dr Ouida Sills rheumatologist who is agreeable with above plan - sign release to get ekg result from [place], MD to ensure no cardiac sarcoid issues   #Followup  3 months

## 2014-03-27 NOTE — Progress Notes (Signed)
Subjective:    Patient ID: [name] Page, female    DOB: 11-02-1930, 80 y.o.   MRN: [id number]  HPI    OV 03/27/2014  Chief Complaint  Patient presents with  . Follow-up    Pt states her breathing is unchagned. Pt states she did follow at [facility]. Pt c/o dry cough. Pt states because she has decreased in pred her cough has increased. Pt denies SOB and CP/tightness.    FU sarcoidosis causing RML syndrome (chronic problem ? Since 2011)  along with ocular sarcoid -diagnosed at [facility] 2015  COugh that occurred with RML syndrome for years is now under cotnrol with prednisone. Feels prednisone made dramatic improvement in cough. [facility] told her to wean off prednisone compleletly. So several weeks ago she was on 22m per day; now at 5 mg per day and feels mild cough has returned but still acceptable levels. She has met with Dr AOuida Sillstoday who is supportive of taper but has left it to her discussion with me.  She has no new issues.  She wants to strke a balance between prednisone side effects and cough control; both are important to her.    PFT 02/14/14: [facility] PFTs are performed today to assess respiratory functional status in 135 Interpretation:  Spirometry with flow volume loop demonstrates no airway obstruction. The maximum voluntary ventilation is normal. Lung volumes are normal. Lung volumes were determined by inert gas methodology which demonstrated good intra pulmonary gas mixing. The diffusing capacity for carbon monoxide, a reflection of alveolar-capillary gas transport, is normal.  Results reviewed and interpreted by NGeorgia LopesMD  CXR 02/14/14 - [facility] report  Impression: Unchanged right middle lobe atelectasis  Electronically Reviewed by: ARolin Barry MD Electronically Reviewed on: 02/14/2014 11:43 AM  I have reviewed the images and concur with the above findings.  Electronically Signed by: MTillman Abide MD Electronically Signed on: 02/14/2014 12:08  PM  Review of Systems  Constitutional: Negative for fever and unexpected weight change.  HENT: Negative for congestion, dental problem, ear pain, nosebleeds, postnasal drip, rhinorrhea, sinus pressure, sneezing, sore throat and trouble swallowing.   Eyes: Negative for redness and itching.  Respiratory: Positive for cough. Negative for chest tightness, shortness of breath and wheezing.   Cardiovascular: Negative for palpitations and leg swelling.  Gastrointestinal: Negative for nausea and vomiting.  Genitourinary: Negative for dysuria.  Musculoskeletal: Negative for joint swelling.  Skin: Negative for rash.  Neurological: Negative for headaches.  Hematological: Does not bruise/bleed easily.  Psychiatric/Behavioral: Negative for dysphoric mood. The patient is not nervous/anxious.        Objective:   Physical Exam  Vitals reviewed. Constitutional: She is oriented to person, place, and time. She appears well-developed and well-nourished. No distress.  HENT:  Head: Normocephalic and atraumatic.  Right Ear: External ear normal.  Left Ear: External ear normal.  Mouth/Throat: Oropharynx is clear and moist. No oropharyngeal exudate.  Eyes: Conjunctivae and EOM are normal. Pupils are equal, round, and reactive to light. Right eye exhibits no discharge. Left eye exhibits no discharge. No scleral icterus.  Neck: Normal range of motion. Neck supple. No JVD present. No tracheal deviation present. No thyromegaly present.  Cardiovascular: Normal rate, regular rhythm, normal heart sounds and intact distal pulses.  Exam reveals no gallop and no friction rub.   No murmur heard. Pulmonary/Chest: Effort normal and breath sounds normal. No respiratory distress. She has no wheezes. She has no rales. She exhibits no tenderness.  Abdominal: Soft. Bowel sounds are  normal. She exhibits no distension and no mass. There is no tenderness. There is no rebound and no guarding.  Musculoskeletal: Normal range of  motion. She exhibits no edema and no tenderness.  Lymphadenopathy:    She has no cervical adenopathy.  Neurological: She is alert and oriented to person, place, and time. She has normal reflexes. No cranial nerve deficit. She exhibits normal muscle tone. Coordination normal.  Skin: Skin is warm and dry. No rash noted. She is not diaphoretic. No erythema. No pallor.  Psychiatric: She has a normal mood and affect. Her behavior is normal. Judgment and thought content normal.    Filed Vitals:   03/27/14 1357  BP: 118/74  Pulse: 66  Height: _0  (1.549 m)  Weight: 143 lb (64.864 kg)  SpO2: 98%         Assessment & Plan:  #Sarcoidosis and right middle lobe syndrom  - continue prednisone at  30m per day; because this appears to be miniumum dose that strikes balance between side effects and symptom control - likely need steroids for a few years - keep followup with Dr AOuida Sillsrheumatologist who is agreeable with above plan - sign release [place] to ensure no cardiac sarcoid issues   #Followup  3 months

## 2014-04-03 NOTE — Assessment & Plan Note (Signed)
#  Sarcoidosis and right middle lobe syndrom  - continue prednisone at  5mg  per day; because this appears to be miniumum dose that strikes balance between side effects and symptom control - advisehd she might need and likely need steroids for a few years - keep followup with Dr Ouida Sills rheumatologist who is agreeable with above plan - sign release to get ekg result from [place], MD to ensure no cardiac sarcoid issues   #Followup  3 months

## 2014-04-05 DIAGNOSIS — M25519 Pain in unspecified shoulder: Secondary | ICD-10-CM | POA: Diagnosis not present

## 2014-04-20 DIAGNOSIS — M25819 Other specified joint disorders, unspecified shoulder: Secondary | ICD-10-CM | POA: Diagnosis not present

## 2014-04-20 DIAGNOSIS — Z5189 Encounter for other specified aftercare: Secondary | ICD-10-CM | POA: Diagnosis not present

## 2014-05-02 DIAGNOSIS — D869 Sarcoidosis, unspecified: Secondary | ICD-10-CM | POA: Diagnosis not present

## 2014-05-17 DIAGNOSIS — H4720 Unspecified optic atrophy: Secondary | ICD-10-CM | POA: Diagnosis not present

## 2014-05-17 DIAGNOSIS — D8689 Sarcoidosis of other sites: Secondary | ICD-10-CM | POA: Diagnosis not present

## 2014-05-30 DIAGNOSIS — Z1231 Encounter for screening mammogram for malignant neoplasm of breast: Secondary | ICD-10-CM | POA: Diagnosis not present

## 2014-05-31 ENCOUNTER — Encounter: Payer: Self-pay | Admitting: Gynecology

## 2014-06-08 DIAGNOSIS — M79675 Pain in left toe(s): Secondary | ICD-10-CM | POA: Diagnosis not present

## 2014-06-08 DIAGNOSIS — B351 Tinea unguium: Secondary | ICD-10-CM | POA: Diagnosis not present

## 2014-06-08 DIAGNOSIS — M79674 Pain in right toe(s): Secondary | ICD-10-CM | POA: Diagnosis not present

## 2014-06-12 ENCOUNTER — Encounter: Payer: Self-pay | Admitting: Internal Medicine

## 2014-07-10 ENCOUNTER — Ambulatory Visit (INDEPENDENT_AMBULATORY_CARE_PROVIDER_SITE_OTHER): Payer: Medicare Other | Admitting: Internal Medicine

## 2014-07-10 ENCOUNTER — Encounter: Payer: Self-pay | Admitting: Internal Medicine

## 2014-07-10 ENCOUNTER — Ambulatory Visit (INDEPENDENT_AMBULATORY_CARE_PROVIDER_SITE_OTHER)
Admission: RE | Admit: 2014-07-10 | Discharge: 2014-07-10 | Disposition: A | Discharge status: Home / Self Care | Payer: Medicare Other | Source: Ambulatory Visit | Attending: Internal Medicine | Admitting: Internal Medicine

## 2014-07-10 VITALS — BP 140/74 | HR 61 | Ht 62.0 in | Wt 147.6 lb

## 2014-07-10 DIAGNOSIS — R05 Cough: Secondary | ICD-10-CM | POA: Diagnosis not present

## 2014-07-10 DIAGNOSIS — D869 Sarcoidosis, unspecified: Secondary | ICD-10-CM

## 2014-07-10 DIAGNOSIS — R0600 Dyspnea, unspecified: Secondary | ICD-10-CM

## 2014-07-10 DIAGNOSIS — R053 Chronic cough: Secondary | ICD-10-CM

## 2014-07-10 DIAGNOSIS — J9819 Other pulmonary collapse: Secondary | ICD-10-CM

## 2014-07-10 NOTE — Progress Notes (Signed)
Subjective:    Patient ID: [name] Page, female    DOB: 09/23/1930, 80 y.o.   MRN: [id number]  HPI     OV 01/30/2014  Chief Complaint  Patient presents with  . Pulmonary Consult    Referred by Dr. Ouida Sills for Sarcoidosis.    December 37, 2620: 80 year old white female who is generall active at gym and exercises. Referred by Dr. Maureen Ralphs for abnormal imaging. She is s/p left hip replacement inNov 2009 but currently needs bursectomy versus ligament repair. So, she has preop CXR 07/19/2010 which showed RML atelectasis with associated soft tisse fullness. REsulted in CT chest 07/23/2010 which showed RML partial collapse with surrounding fullness, 73m Left apical nodule image 11, LUL 675mnodule image 10, soft tissue fullness at right hilum and borderline hilar, subcarinal nodes. Ther is one calcified nodule  In terms of chest symptoms: [place] "[place]" in plane. Rx with 2 rounds of prednisone and 1 round of abx and it cleared up. In June 2011 she had routine cxr at Dr. ArReynaldo Miniumffice - she believes it might have had some abnormality NOS. Then, [place] she noticed dry irritating cough that has persisted. Cough present only at day time. Relieved by throat lozenges. Denies associated sinus drainage but admits to ACE inhibitor intake since 2007. Also admits to mild GERD for which she is take occassional zantac (gerd made worse by chocolates and spices). Denies dyspnea, wheezing, sputum, fever, chills, weight loss. In terms [place] RECL BRONCH and CHANGE ACE INHIBITOR TO BENICAR   Aug 01, 2010: BRMcGregorML orifice. No endobronhcial lesion. Lavage - neutrophils. Endobrhnical bx and micro - non-diagnostic. REC: 3 week prednisone and 1 week ABX   September 04, 2010: Followup RML  collapse. AFter bronch cough worsenend. Advised 7 days abx and 3 week prednisone. COmpleted prednisone 1 week ago. AT that point cough resolved "almost completely". Was feeling great. This past weekend, grandkids sick and now she has runny nose, sneezing, fatigue and some dry cough    ..........................................  OV 01/30/14 - this is a NEW VISIT - been > 3 years since I last saw er. In 2011 diagnosed with new RML collapse syndrome with focal obstruction at entrance of RML  with non diagnostic bronch [place] has diagnosis of sarcoid pulmnary and orbit. Feels with her 82 years better to be followed locally  She has detailed notes with her. Looks like   -  [facility] did and was non-diagnosti  - 08/02/11 CT showed complete collapse RML and narrowing of LUL  - 01/20/11 - same as above  - dec 2013 - repeat bronch - stenotic RML and LUL per patient - June 2014 0 Ct unchanged  - Dec 2014 - ct unchanged - Jan 2015 - PET scan showed positive lymph nodes in orbit in addition to lung problems , MRI confirmed abnormalities in optic nerve - 10/18/13 - [facility] Dr JuAlgis Greenhouse orbital bx - SARCOID +  - 11/15/13 - Dr WaKerin Ransomtarted prednisone 4017m 1 month, 74m90mr day x 2nd month   - 02/14/14 - saw Dr  Anderson local rheumatologist in [place]. On prednisone 45m per day at this point  Of note, her main pulmonary problem is cough and it resolved completely with prednsone though prednisone is  Causing some anxiety. She does notice that when she tries to come off prednisone cough returns. Her next taper schedule is for 570mper day  She also has some nasal ithcing  [facility]  has a past medical history of Hypertension; High cholesterol; Cervical spondylosis; Arthritis; DVT (deep venous thrombosis) (2006); Chest pain (07/28/2008); Dislocation closed,  shoulder (7/14); and Collapsed lung ( 12/11).   has past surgical history that includes Hemorrhoid surgery (12/1972); Foot surgery (06/2005); Intraocular lens insertion; Skin biopsy (01/2007); Total hip arthroplasty (06/2007); and Total knee arthroplasty (10/2007).   Past Medical History:  Reviewed history from 07/30/2010 and no changes required.  Hyperlipidemia  Hypertension  Deep Vein Thrombosis/Phlebitis, [id number] with birth of son, and again in 2006  - 1966 "calf of left leg" following labor in [facility] extra week. No Rx.  - 2006 was "deep vein" followng foot surgery - took coumadin for 6 months  - not on life long anticoagulation  Left hip pain  - repeated cortisone shots  Stress Test 12-09-Dr. NasCathie Oldenrmal   Total left hip replacement 11-08,  total Knee, right replacement 3-09  Lens replacement-both 05-2005  Hemorrhoidectomy-12-1972  Left foot surgery -06-2005  Basal cell carcinoma biopsied and removed, RL leg 01-2007   OV 03/27/2014  Chief Complaint  Patient presents with  . Follow-up    Pt states her breathing is unchagned. [facility] c/o dry cough. Pt states because she has decreased in pred her cough has increased. Pt denies SOB and CP/tightness.    FU sarcoidosis causing RML syndrome (chronic problem  Since 2011)  along with ocular sarcoid -diagnosed [facility] 2015  COugh that occurred with RML syndrome for years is now under cotnrol with prednisone. Feels prednisone made dramatic improvement in cough. [facility] told her to wean off prednisone compleletly. So several weeks ago she was on 45m16mr day; now at 5 mg per day and feels mild cough has returned but still acceptable levels. She has met with Dr AndeOuida Sillsay who is supportive of taper but has left it to her discussion with me.  She has no new issues.  She wants to strke a balance between prednisone side effects and cough control; both are important to her.     PFT 02/14/14: [facility] PFTs are performed today to assess respiratory functional status in 135 Interpretation:  Spirometry with flow volume loop demonstrates no airway obstruction. The maximum voluntary ventilation is normal. Lung volumes are normal. Lung volumes were determined by inert gas methodology which demonstrated good intra pulmonary gas mixing. The diffusing capacity for carbon monoxide, a reflection of alveolar-capillary gas transport, is normal.  Results reviewed and interpreted by NeilGeorgia Lopes CXR 02/14/14 - [facility] report  Impression: Unchanged right middle lobe atelectasis    OV 07/10/2014  Chief Complaint  Patient presents with  . Follow-up    Pt c/o increase in cough with intermittent mucus production-clear and white in color and c/o increase in SOB. Pt denies CP/tightness.     Follow sarcoidosis with right middle lobe syndrome that is chronic and [facility] is chronic cough that is generally improved with prednisone  5 mg per day  - This is a routine 3 month follow-up. She saw Dr. Tobie Lords of rheumatology in October 2015 at that time the cough was well controlled and only mild with her baseline prednisone 5 mg per day. However for the past 6 weeks she is reporting increased chronic cough that is now upper end of moderate in severity. Mostly in the daytime and less in the nighttime. Mostly dry in quality with occasional thick sputum present. The cough has progressed. She is also noticing slight increase in shortness of breath when she climbs a hill this is an associated symptom. There is no associated chest pain. Dyspnea is relieved by rest. No associated wheezing. Terms of ocular sarcoid this is fine. No other new issues no fever or weight loss. RSI cough score is:  Cough has laryngeal quality    Dr Lorenza Cambridge Reflux Symptom Index (> 13-15 suggestive of LPR cough) 0 -> 5  =  none ->severe problem   Hoarseness of problem with voice 3  Clearing  Of Throat 3  Excess throat mucus or feeling of post nasal drip 1  Difficulty swallowing food, liquid or tablets 3  Cough after eating or lying down 4  Breathing difficulties or choking episodes 4  Troublesome or annoying cough 5  Sensation of something sticking in throat or lump in throat 1  Heartburn, chest pain, indigestion, or stomach acid coming up 3  TOTAL 27      - Past medical history: This was reviewed. She saw Dr. Tobie Lords in October 2015 and was asked to come back in 6 months.  - LAbs today 07/10/2014  - Spirometry: Normal , FEv1 1.^L/91%, RAtio 63  - CXR: ordered  Immunization History  Administered Date(s) Administered  . Influenza Split 04/11/2013, 04/11/2014  . Influenza Whole 04/15/2010  . Pneumococcal Polysaccharide-23 05/13/2004  . Pneumococcal-Unspecified 08/11/2010, 03/11/2014  . Td 02/08/2002  . Zoster 06/12/2007     Review of Systems  Constitutional: Negative for fever and unexpected weight change.  HENT: Positive for congestion. Negative for dental problem, ear pain, nosebleeds, postnasal drip, rhinorrhea, sinus pressure, sneezing, sore throat and trouble swallowing.   Eyes: Negative for redness and itching.  Respiratory: Positive for cough and shortness of breath. Negative for chest tightness and wheezing.   Cardiovascular: Negative for palpitations and leg swelling.  Gastrointestinal: Negative for nausea and vomiting.  Genitourinary: Negative for dysuria.  Musculoskeletal: Negative for joint swelling.  Skin: Negative for rash.  Neurological: Negative for headaches.  Hematological: Does not bruise/bleed easily.  Psychiatric/Behavioral: Negative for dysphoric mood. The patient is not nervous/anxious.    Current outpatient prescriptions: amLODipine-benazepril (LOTREL) 10-20 MG per capsule, Take 1 capsule by mouth every morning. , Disp: , Rfl: ;  azelastine (OPTIVAR) 0.05 % ophthalmic solution, Place  1 drop into both eyes every morning., Disp: , Rfl: ;  bisacodyl (DULCOLAX) 5 MG EC tablet, Take 5 mg by mouth daily as needed for constipation. For constipation, Disp: , Rfl:  Calcium Carbonate-Vitamin D (CALCIUM + D PO), Take 1 tablet by mouth 2 (two) times daily., Disp: , Rfl: ;  Efinaconazole (JUBLIA) 10 % SOLN, Apply topically., Disp: , Rfl: ;  gemfibrozil (LOPID) 600 MG tablet, Take 600 mg by mouth 2 (two) times daily before a meal., Disp: , Rfl: ;  ibuprofen (ADVIL,MOTRIN) 200 MG tablet, Take 600 mg by mouth daily as needed. For pain, Disp: , Rfl:  levothyroxine (SYNTHROID, LEVOTHROID) 25 MCG tablet, Take 1 tablet by mouth daily., Disp: ,  Rfl: ;  predniSONE (DELTASONE) 5 MG tablet, Take 1 tablet (5 mg total) by mouth daily with breakfast., Disp: 90 tablet, Rfl: 3;  Multiple Vitamin (MULTIVITAMIN WITH MINERALS) TABS, Take 1 tablet by mouth daily., Disp: , Rfl:      Objective:   Physical Exam  Constitutional: She is oriented to person, place, and time. She appears well-developed and well-nourished. No distress.  HENT:  Head: Normocephalic and atraumatic.  Right Ear: External ear normal.  Left Ear: External ear normal.  Mouth/Throat: Oropharynx is clear and moist. No oropharyngeal exudate.  Her cough appears to have a laryngeal quality  Eyes: Conjunctivae and EOM are normal. Pupils are equal, round, and reactive to light. Right eye exhibits no discharge. Left eye exhibits no discharge. No scleral icterus.  Neck: Normal range of motion. Neck supple. No JVD present. No tracheal deviation present. No thyromegaly present.  Cardiovascular: Normal rate, regular rhythm, normal heart sounds and intact distal pulses.  Exam reveals no gallop and no friction rub.   No murmur heard. Pulmonary/Chest: Effort normal and breath sounds normal. No respiratory distress. She has no wheezes. She has no rales. She exhibits no tenderness.  Abdominal: Soft. Bowel sounds are normal. She exhibits no distension and no  mass. There is no tenderness. There is no rebound and no guarding.  Musculoskeletal: Normal range of motion. She exhibits no edema or tenderness.  Lymphadenopathy:    She has no cervical adenopathy.  Neurological: She is alert and oriented to person, place, and time. She has normal reflexes. No cranial nerve deficit. She exhibits normal muscle tone. Coordination normal.  Skin: Skin is warm and dry. No rash noted. She is not diaphoretic. No erythema. No pallor.  Psychiatric: She has a normal mood and affect. Her behavior is normal. Judgment and thought content normal.  Vitals reviewed.   Filed Vitals:   07/10/14 0934  BP: 140/74  Pulse: 61  Height: _0  (1.575 m)  Weight: 147 lb 9.6 oz (66.951 kg)  SpO2: 98%         Assessment & Plan:     ICD-9-CM ICD-10-CM   1. Sarcoidosis 135 D86.9 DG Chest 2 View  2. Dyspnea 786.09 R06.00 Spirometry with Graph  3. 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   4. Chronic cough 786.2 R05       #Sarcoidosis and right middle lobe syndrome and chronic cough  - COncern is worsening sarcoid versus Cough Neuropathy versus both - SPirometry is normal  REcommend  - do CXR 2 view; will call with results  -If CXR not worse, then will consider treatment for cough neuropathy with gabapentin - if CR worse, then will have to consider step up sarcoid treatment with increased prednisone and adding methotrexate - both of which have side effect profile we discused   Followup 6 to  8 weeks with me; RSI cough score part 1 at followup    Dr. Brand Males, M.D., [facility].C.P Pulmonary and Critical Care Medicine Staff Physician [facility] Pager: [phone], If no answer or between  15:00h - 7:00h: call [phone]  07/10/2014 10:06 AM

## 2014-07-10 NOTE — Patient Instructions (Addendum)
ICD-9-CM ICD-10-CM   1. Sarcoidosis 135 D86.9 DG Chest 2 View  2. Dyspnea 786.09 R06.00 Spirometry with Graph  3. 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   4. Chronic cough 786.2 R05       #Sarcoidosis and right middle lobe syndrome and chronic cough  - COncern is worsening sarcoid versus Cough Neuropathy versus both - SPirometry is normal  REcommend  - do CXR 2 view; will call with results  -If CXR not worse, then will consider treatment for cough neuropathy with gabapentin - if CR worse, then will have to consider step up sarcoid treatment with increased prednisone and adding methotrexate - both of which have side effect profile we discused   Followup 6 to  8 weeks with me; RSI cough score part 1 at followup

## 2014-07-12 ENCOUNTER — Telehealth: Payer: Self-pay | Admitting: Internal Medicine

## 2014-07-12 DIAGNOSIS — R05 Cough: Secondary | ICD-10-CM | POA: Insufficient documentation

## 2014-07-12 DIAGNOSIS — R053 Chronic cough: Secondary | ICD-10-CM | POA: Insufficient documentation

## 2014-07-12 DIAGNOSIS — B351 Tinea unguium: Secondary | ICD-10-CM | POA: Diagnosis not present

## 2014-07-12 DIAGNOSIS — M79674 Pain in right toe(s): Secondary | ICD-10-CM | POA: Diagnosis not present

## 2014-07-12 DIAGNOSIS — M79675 Pain in left toe(s): Secondary | ICD-10-CM | POA: Diagnosis not present

## 2014-07-12 HISTORY — DX: Chronic cough: R05.3

## 2014-07-12 MED ORDER — GABAPENTIN 300 MG PO CAPS: ORAL_CAPSULE | ORAL | Status: DC

## 2014-07-12 NOTE — Telephone Encounter (Signed)
CXR 07/10/14 shows persistent right middle collapse. This or sarcoid can cbe causing cough. RX involves higher steroid +/- methotrexate trial or surgery. Both more risky  Called patient but spoke to husband. They feel more conservative approach better - I agree wit them  Rec Rx neurogeni cough and reassess  Please send her  Take gabapentin 300mg  once daily at night x 5 days, then 300mg  twice daily x 5 days, then 300mg  three times daily to continue. If this makes you too sleepy or drowsy call us and we will cut your medication dosing down  ROV 6 weeks-8 weeks

## 2014-07-12 NOTE — Telephone Encounter (Signed)
Called and spoke to pt. Informed pt of the recs per MR. Rx sent to preferred pharmacy. Pt has f/u in Jan 2016. Pt aware. Pt verbalized understanding and denied any further questions or concerns at this time.

## 2014-07-22 ENCOUNTER — Emergency Department (INDEPENDENT_AMBULATORY_CARE_PROVIDER_SITE_OTHER)
Admission: EM | Admit: 2014-07-22 | Discharge: 2014-07-22 | Disposition: A | Discharge status: Home / Self Care | Payer: Medicare Other | Source: Home / Self Care | Attending: Family Medicine | Admitting: Family Medicine

## 2014-07-22 ENCOUNTER — Encounter (HOSPITAL_COMMUNITY): Payer: Self-pay

## 2014-07-22 DIAGNOSIS — S41112A Laceration without foreign body of left upper arm, initial encounter: Secondary | ICD-10-CM | POA: Diagnosis not present

## 2014-07-22 MED ORDER — SILVER SULFADIAZINE 1 % EX CREA: 1.0000 "application " | TOPICAL_CREAM | Freq: Two times a day (BID) | CUTANEOUS | Status: DC

## 2014-07-22 MED ORDER — SILVER SULFADIAZINE 1 % EX CREA
TOPICAL_CREAM | CUTANEOUS | Status: AC
  Filled 2014-07-22: qty 85

## 2014-07-22 NOTE — Discharge Instructions (Signed)
Skin Tear Care  A skin tear is a wound in which the top layer of skin has peeled off. This is a common problem with aging because the skin becomes thinner and more fragile as a person gets older. In addition, some medicines, such as oral corticosteroids, can lead to skin thinning if taken for long periods of time.   A skin tear is often repaired with tape or skin adhesive strips. This keeps the skin that has been peeled off in contact with the healthier skin beneath. Depending on the location of the wound, a bandage (dressing) may be applied over the tape or skin adhesive strips. Sometimes, during the healing process, the skin turns black and dies. Even when this happens, the torn skin acts as a good dressing until the skin underneath gets healthier and repairs itself.  HOME CARE INSTRUCTIONS   · Change dressings once per day or as directed by your caregiver.  ¨ Gently clean the skin tear and the area around the tear using saline solution or mild soap and water.  ¨ Do not rub the injured skin dry. Let the area air dry.  ¨ Apply petroleum jelly or an antibiotic cream or ointment to keep the tear moist. This will help the wound heal. Do not allow a scab to form.  ¨ If the dressing sticks before the next dressing change, moisten it with warm soapy water and gently remove it.  · Protect the injured skin until it has healed.  · Only take over-the-counter or prescription medicines as directed by your caregiver.  · Take showers or baths using warm soapy water. Apply a new dressing after the shower or bath.  · Keep all follow-up appointments as directed by your caregiver.    SEEK IMMEDIATE MEDICAL CARE IF:   · You have redness, swelling, or increasing pain in the skin tear.  · You have pus coming from the skin tear.  · You have chills.  · You have a red streak that goes away from the skin tear.  · You have a bad smell coming from the tear or dressing.  · You have a fever or persistent symptoms for more than 2-3 days.  · You  have a fever and your symptoms suddenly get worse.  MAKE SURE YOU:  · Understand these instructions.  · Will watch this condition.  · Will get help right away if your child is not doing well or gets worse.  Document Released: 04/22/2001 Document Revised: 04/21/2012 Document Reviewed: 02/09/2012  ExitCare® Patient Information ©2015 [employer]. This information is not intended to replace advice given to you by your health care provider. Make sure you discuss any questions you have with your health care provider.

## 2014-07-22 NOTE — ED Notes (Signed)
States aprox 1 hour PTA, tangled her left arm w door jam, scraped skin . Bleeding controlled at present. C/o shoulder pain no different than usual

## 2014-07-22 NOTE — ED Notes (Signed)
Wound cleansed w antiseptic spray, silvadine ointment appkied , ACE wrap to secure

## 2014-07-22 NOTE — ED Provider Notes (Signed)
CSN: [id number]     Arrival date & time 07/22/14  1456 History   First MD Initiated Contact with Patient 07/22/14 1616     Chief Complaint  Patient presents with  . Arm Injury   (Consider location/radiation/quality/duration/timing/severity/associated sxs/prior Treatment) HPI          80 year old female presents complaining of a skin tear to her dorsum of her left forearm. About one hour prior to arrival she got her arm stuck in a door jam and scraped the skin off her arm. She is here because she is on chronic prednisone and she is concerned about getting an infection. No other injury. No numbness or swelling distal to the injury. She feels like she has some soreness in her shoulder as well, she has full range of motion and no tenderness.    Past Medical History  Diagnosis Date  . Hypertension   . High cholesterol   . Cervical spondylosis     C3-4 AND C4-5  . Arthritis   . DVT (deep venous thrombosis) 2006  . Chest pain 07/28/2008    H/O, normal stress nuclear EF 78%  . Dislocation closed, shoulder 7/14  . Collapsed lung  12/11    being treated at [facility] - in left lung   Past Surgical History  Procedure Laterality Date  . Hemorrhoid surgery  12/1972  . Foot surgery  06/2005  . Intraocular lens insertion      right eye 10 /16 and left 06/16/2005  . Skin biopsy  01/2007    basal cell carcinoma removed from right lower leg   . Total hip arthroplasty  06/2007    left  . Total knee arthroplasty  10/2007    right   Family History  Problem Relation Age of Onset  . Diabetes Mother   . Heart failure Mother   . Hypertension Father   . Diabetes Father   . Heart disease Father   . Heart attack Father    History  Substance Use Topics  . Smoking status: Never Smoker   . Smokeless tobacco: Never Used  . Alcohol Use: Yes     Comment: BEER/glass of wine A DAY   OB History    Gravida Para Term Preterm AB TAB SAB Ectopic Multiple Living   2 2 2       2      Review of Systems  Skin:  Positive for wound (see history of present illness).  All other systems reviewed and are negative.   Allergies  Review of patient's allergies indicates no known allergies.  Home Medications   Prior to Admission medications   Medication Sig Start Date End Date Taking? Authorizing Provider  amLODipine-benazepril (LOTREL) 10-20 MG per capsule Take 1 capsule by mouth every morning.     Historical Provider, MD  azelastine (OPTIVAR) 0.05 % ophthalmic solution Place 1 drop into both eyes every morning.    Historical Provider, MD  bisacodyl (DULCOLAX) 5 MG EC tablet Take 5 mg by mouth daily as needed for constipation. For constipation    Historical Provider, MD  Calcium Carbonate-Vitamin D (CALCIUM + D PO) Take 1 tablet by mouth 2 (two) times daily.    Historical Provider, MD  Efinaconazole (JUBLIA) 10 % SOLN Apply topically.    Historical Provider, MD  gabapentin (NEURONTIN) 300 MG capsule 300mg  once daily at night x 5 days, then 300mg  twice daily x 5 days, then 300mg  three times daily to continue. 07/12/14   Brand Males, MD  gemfibrozil (  LOPID) 600 MG tablet Take 600 mg by mouth 2 (two) times daily before a meal.    Historical Provider, MD  ibuprofen (ADVIL,MOTRIN) 200 MG tablet Take 600 mg by mouth daily as needed. For pain    Historical Provider, MD  levothyroxine (SYNTHROID, LEVOTHROID) 25 MCG tablet Take 1 tablet by mouth daily.    Historical Provider, MD  Multiple Vitamin (MULTIVITAMIN WITH MINERALS) TABS Take 1 tablet by mouth daily.    Historical Provider, MD  predniSONE (DELTASONE) 5 MG tablet Take 1 tablet (5 mg total) by mouth daily with breakfast. 03/27/14   Brand Males, MD  silver sulfADIAZINE (SILVADENE) 1 % cream Apply 1 application topically 2 (two) times daily. 07/22/14   Freeman Caldron Lindzie Boxx, PA-C   BP 163/61 mmHg  Pulse 68  Temp(Src) 98.3 F (36.8 C) (Oral)  Resp 16  SpO2 100% Physical Exam  Constitutional: She is oriented to person, place, and time. Vital signs are  normal. She appears well-developed and well-nourished. No distress.  HENT:  Head: Normocephalic and atraumatic.  Pulmonary/Chest: Effort normal. No respiratory distress.  Musculoskeletal:       Left shoulder: Normal.       Left forearm: She exhibits laceration (5x5 cm skin tear, very superficial, no active bleeding, minimally tender.  ). She exhibits no tenderness and no swelling.  Neurological: She is alert and oriented to person, place, and time. She has normal strength. Coordination normal.  Skin: Skin is warm and dry. No rash noted. She is not diaphoretic.  Psychiatric: She has a normal mood and affect. Judgment normal.  Nursing note and vitals reviewed.   ED Course  Procedures (including critical care time) Labs Review Labs Reviewed - No data to display  Imaging Review No results found.   MDM   1. Skin tear of upper arm without complication, left, initial encounter    The wound was rinsed with wound cleanser, Silvadene cream was applied and the wound was dressed with a nonadherent dressing. She will continue to apply Silvadene twice a day until this heals. Follow-up if any signs of infection.   Meds ordered this encounter  Medications  . silver sulfADIAZINE (SILVADENE) 1 % cream    Sig: Apply 1 application topically 2 (two) times daily.    Dispense:  50 g    Refill:  1    Order Specific Question:  Supervising Provider    Answer:  Ihor Gully D [place] Omir Cooprider, PA-C 07/22/14 [phone]

## 2014-08-09 DIAGNOSIS — B351 Tinea unguium: Secondary | ICD-10-CM | POA: Diagnosis not present

## 2014-08-09 DIAGNOSIS — M79609 Pain in unspecified limb: Secondary | ICD-10-CM | POA: Diagnosis not present

## 2014-08-24 ENCOUNTER — Ambulatory Visit (INDEPENDENT_AMBULATORY_CARE_PROVIDER_SITE_OTHER): Payer: Medicare Other | Admitting: Internal Medicine

## 2014-08-24 ENCOUNTER — Encounter: Payer: Self-pay | Admitting: Internal Medicine

## 2014-08-24 VITALS — BP 160/78 | HR 69 | Ht 62.0 in | Wt 144.8 lb

## 2014-08-24 DIAGNOSIS — R053 Chronic cough: Secondary | ICD-10-CM

## 2014-08-24 DIAGNOSIS — J9819 Other pulmonary collapse: Secondary | ICD-10-CM | POA: Diagnosis not present

## 2014-08-24 DIAGNOSIS — R05 Cough: Secondary | ICD-10-CM

## 2014-08-24 DIAGNOSIS — D869 Sarcoidosis, unspecified: Secondary | ICD-10-CM

## 2014-08-24 MED ORDER — FOLIC ACID 1 MG PO TABS: 1.0000 mg | ORAL_TABLET | Freq: Every day | ORAL | Status: DC

## 2014-08-24 MED ORDER — METHOTREXATE SODIUM 5 MG PO TABS: 5.0000 mg | ORAL_TABLET | ORAL | Status: DC

## 2014-08-24 NOTE — Progress Notes (Signed)
Subjective:    Patient ID: [name] Page, female    DOB: Feb 01, 1931, 81 y.o.   MRN: [id number]  HPI    OV 08/24/2014  Chief Complaint  Patient presents with  . Follow-up    Pt stated the gabapentin has not changed her breathing or cough. Pt c/o prod cough with white mucus. Pt denies SOB and CP/tightness.    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 Main symptom is severe chronic cough with high RSI score suggestive of irritable larynx syndrome that is associated as well  She always finds prednisone to help the cough but due to age and side effects despite good functional status she opts to take a low-dose of prednisone. Currently she is on 5 mg prednisone. In November 2015 due to RSI score of 27 for cough but decided to trial gabapentin to tackle the neurogenic component of cough. She reports now that the gabapentin has not helped her at all although objectively the RSI cough score has drop to 20. She is very frustrated. She and her husband are reluctant to have right middle lobectomy given her advanced age despite good functional status. There are no open to trying methotrexate to tackle the inflammation of sarcoidosis and as a steroid sparing agent. She also wants to come off gabapentin   Past, Family, Social reviewed: no change since last visit  Review of Systems  Constitutional: Negative for fever and unexpected weight change.  HENT: Negative for congestion, dental problem, ear pain, nosebleeds, postnasal drip, rhinorrhea, sinus pressure, sneezing, sore throat and trouble swallowing.   Eyes: Negative for redness and itching.  Respiratory: Positive for cough. Negative for chest tightness, shortness of breath and wheezing.   Cardiovascular: Negative for palpitations and leg swelling.  Gastrointestinal: Negative for nausea and vomiting.  Genitourinary: Negative for dysuria.  Musculoskeletal: Negative for joint swelling.  Skin:  Negative for rash.  Neurological: Negative for headaches.  Hematological: Does not bruise/bleed easily.  Psychiatric/Behavioral: Negative for dysphoric mood. The patient is not nervous/anxious.        Objective:   Physical Exam  Constitutional: She is oriented to person, place, and time. She appears well-developed and well-nourished. No distress.  HENT:  Head: Normocephalic and atraumatic.  Right Ear: External ear normal.  Left Ear: External ear normal.  Mouth/Throat: Oropharynx is clear and moist. No oropharyngeal exudate.  Eyes: Conjunctivae and EOM are normal. Pupils are equal, round, and reactive to light. Right eye exhibits no discharge. Left eye exhibits no discharge. No scleral icterus.  Neck: Normal range of motion. Neck supple. No JVD present. No tracheal deviation present. No thyromegaly present.  Cardiovascular: Normal rate, regular rhythm, normal heart sounds and intact distal pulses.  Exam reveals no gallop and no friction rub.   No murmur heard. Pulmonary/Chest: Effort normal and breath sounds normal. No respiratory distress. She has no wheezes. She has no rales. She exhibits no tenderness.  Abdominal: Soft. Bowel sounds are normal. She exhibits no distension and no mass. There is no tenderness. There is no rebound and no guarding.  Musculoskeletal: Normal range of motion. She exhibits no edema or tenderness.  Lymphadenopathy:    She has no cervical adenopathy.  Neurological: She is alert and oriented to person, place, and time. She has normal reflexes. No cranial nerve deficit. She exhibits normal muscle tone. Coordination normal.  Skin: Skin is warm and dry. No rash noted. She is not diaphoretic. No erythema. No pallor.  Psychiatric: She has  a normal mood and affect. Her behavior is normal. Judgment and thought content normal.  Vitals reviewed.   Filed Vitals:   08/24/14 1350  BP: 160/78  Pulse: 69  Height: 5\' 2"  (1.575 m)  Weight: 144 lb 12.8 oz (65.681 kg)  SpO2:  97%         Assessment & Plan:     ICD-9-CM ICD-10-CM   1. Sarcoidosis 135 D86.9   2. 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 Chronic cough 786.2 R05    Appears neurontin has not helped you - so will taper to off  - take 300mg   Twice daily of gabapentin 1 week    - Then take 300 mg once daily 1 week  - Then take 300 mg on Monday Wednesday Friday 1 week  - Then stop  Continue prednisone at 5 mg once daily  Start methotrexate 5 mg tablet once a week on Mondays or Sundays (risk profile and benefits all explained)  Take folic acid 1 mg once daily   Followup   4-6 weeks with cough score   Do CBC and chemistry at follow-up  At follow-up depending on her tolerance of methotrexate we will discuss increasing the dose Postopone discussion on surgery    Dr. Brand Males, M.D., [facility].C.P Pulmonary and Critical Care Medicine Staff Physician [place] [place] [facility] Pager: [phone], If no answer or between  15:00h - 7:00h: call [phone]  08/24/2014 2:47 PM

## 2014-08-24 NOTE — Patient Instructions (Addendum)
ICD-9-CM ICD-10-CM   1. Sarcoidosis 135 D86.9   2. 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 Chronic cough 786.2 R05    Appears neurontin has not helped you  - take 300mg   Twice daily of gabapentin 1 week    - Then take 300 mg once daily 1 week  - Then take 300 mg on Monday Wednesday Friday 1 week  - Then stop  Continue prednisone at 5 mg once daily  Start methotrexate 5 mg tablet once a week on Mondays or Sundays  Take folic acid 1 mg once daily   Followup   4-6 weeks with cough score   Do CBC and chemistry at follow-up  At follow-up depending on her tolerance of methotrexate we will discuss increasing the dose

## 2014-08-25 ENCOUNTER — Telehealth: Payer: Self-pay | Admitting: Internal Medicine

## 2014-08-25 MED ORDER — HYDROCODONE-HOMATROPINE 5-1.5 MG/5ML PO SYRP: 5.0000 mL | ORAL_SOLUTION | Freq: Two times a day (BID) | ORAL | Status: DC

## 2014-08-25 NOTE — Telephone Encounter (Signed)
yues i did and sorry I forgot  Hycodan 29mL twice daily - 30 days with 3 refills  Thanks  Dr. Brand Males, M.D., [facility].C.P Pulmonary and Critical Care Medicine Staff Physician [place] [facility] Pager: [phone], If no answer or between  15:00h - 7:00h: call [phone]  08/25/2014 2:40 PM

## 2014-08-25 NOTE — Telephone Encounter (Signed)
Called spoke with pt. She reports MR was going to give her RX for cough syrup. She does not know the name of it but was told it contains narcotic and it has to be taken to the pharmacy. She wants to pick this up Monday. Please advise MR thanks

## 2014-08-25 NOTE — Telephone Encounter (Signed)
RX printed and given to elise for MR to sign. Pt is picking this up Monday. Nothing further needed

## 2014-09-07 DIAGNOSIS — M79609 Pain in unspecified limb: Secondary | ICD-10-CM | POA: Diagnosis not present

## 2014-09-07 DIAGNOSIS — B351 Tinea unguium: Secondary | ICD-10-CM | POA: Diagnosis not present

## 2014-09-11 ENCOUNTER — Telehealth: Payer: Self-pay | Admitting: Internal Medicine

## 2014-09-11 DIAGNOSIS — M1990 Unspecified osteoarthritis, unspecified site: Secondary | ICD-10-CM | POA: Diagnosis not present

## 2014-09-11 DIAGNOSIS — Z6825 Body mass index (BMI) 25.0-25.9, adult: Secondary | ICD-10-CM | POA: Diagnosis not present

## 2014-09-11 DIAGNOSIS — D869 Sarcoidosis, unspecified: Secondary | ICD-10-CM | POA: Diagnosis not present

## 2014-09-11 DIAGNOSIS — Z1389 Encounter for screening for other disorder: Secondary | ICD-10-CM | POA: Diagnosis not present

## 2014-09-11 DIAGNOSIS — J984 Other disorders of lung: Secondary | ICD-10-CM | POA: Diagnosis not present

## 2014-09-11 DIAGNOSIS — I10 Essential (primary) hypertension: Secondary | ICD-10-CM | POA: Diagnosis not present

## 2014-09-11 DIAGNOSIS — E785 Hyperlipidemia, unspecified: Secondary | ICD-10-CM | POA: Diagnosis not present

## 2014-09-11 MED ORDER — HYDROCODONE-HOMATROPINE 5-1.5 MG/5ML PO SYRP: 5.0000 mL | ORAL_SOLUTION | Freq: Two times a day (BID) | ORAL | Status: DC

## 2014-09-11 NOTE — Telephone Encounter (Signed)
That is fine 

## 2014-09-11 NOTE — Telephone Encounter (Signed)
Printed for MR to sign. This was placed on his cart. Will route message to Paul to follow up on.

## 2014-09-11 NOTE — Telephone Encounter (Signed)
Last OV 08/24/14 Last fill 08/25/14 #374mL  MR - please advise on refill.  Thanks.

## 2014-09-11 NOTE — Telephone Encounter (Signed)
Rx signed and placed up front for pick up.   lmtcb for pt.

## 2014-09-11 NOTE — Telephone Encounter (Signed)
Pt aware. Nothing further needed at this time.  °

## 2014-10-02 ENCOUNTER — Encounter: Payer: Self-pay | Admitting: Internal Medicine

## 2014-10-02 ENCOUNTER — Ambulatory Visit (INDEPENDENT_AMBULATORY_CARE_PROVIDER_SITE_OTHER): Payer: Medicare Other | Admitting: Internal Medicine

## 2014-10-02 ENCOUNTER — Other Ambulatory Visit (INDEPENDENT_AMBULATORY_CARE_PROVIDER_SITE_OTHER): Payer: Medicare Other

## 2014-10-02 VITALS — BP 158/60 | HR 73 | Ht 62.0 in | Wt 143.0 lb

## 2014-10-02 DIAGNOSIS — R05 Cough: Secondary | ICD-10-CM

## 2014-10-02 DIAGNOSIS — D869 Sarcoidosis, unspecified: Secondary | ICD-10-CM | POA: Diagnosis not present

## 2014-10-02 DIAGNOSIS — Z5181 Encounter for therapeutic drug level monitoring: Secondary | ICD-10-CM

## 2014-10-02 DIAGNOSIS — J9819 Other pulmonary collapse: Secondary | ICD-10-CM | POA: Diagnosis not present

## 2014-10-02 DIAGNOSIS — R053 Chronic cough: Secondary | ICD-10-CM

## 2014-10-02 LAB — CBC
HCT: 35.8 % — ABNORMAL LOW (ref 36.0–46.0)
Hemoglobin: 12.4 g/dL (ref 12.0–15.0)
MCHC: 34.7 g/dL (ref 30.0–36.0)
MCV: 93.9 fl (ref 78.0–100.0)
Platelets: 246 10*3/uL (ref 150.0–400.0)
RBC: 3.82 Mil/uL — ABNORMAL LOW (ref 3.87–5.11)
RDW: 14.3 % (ref 11.5–15.5)
WBC: 5.7 10*3/uL (ref 4.0–10.5)

## 2014-10-02 MED ORDER — METHOTREXATE SODIUM 5 MG PO TABS: 5.0000 mg | ORAL_TABLET | ORAL | Status: DC

## 2014-10-02 NOTE — Patient Instructions (Addendum)
ICD-9-CM ICD-10-CM   1. Sarcoidosis 135 D86.9   2. 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 Chronic cough 786.2 R05    Glad you are much better Do RSI cough score Talk to your PCP ARONSON,RICHARD A, MD and stop Benazepril component in your LOTREL - this can make cough worse Continue prednisone at 5 mg once daily Continue methotrexate 5 mg tablet once a week on Mondays or Sundays Continue folic acid 1 mg once daily  For now hold off on bactrim prophylaxis  Do CBC, BMET and LFT today   Followup - 3 months   - cough score in 3 months  - CBC, BMET, LFT in 3 months  At follow-up depending on response, we can decrease prednisone dose

## 2014-10-02 NOTE — Progress Notes (Signed)
Subjective:    Patient ID: [name] Page, female    DOB: September 28, 1930, 81 y.o.   MRN: [id number]  HPI      OV 10/02/2014  Chief Complaint  Patient presents with  . Follow-up    Pt states her breathing is doing well. Pt states her SOB has improved since last OV. Pt c/o mild cough. Pt denies CP/tightness. Pt stated being on methotrexate has significantly helped.      OV 08/24/2014  Chief Complaint  Patient presents with  . Follow-up    Pt stated the gabapentin has not changed her breathing or cough. Pt c/o prod cough with white mucus. Pt denies SOB and CP/tightness.    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 Main symptom is severe chronic cough with high RSI score suggestive of irritable larynx syndrome that is associated as well  She always finds prednisone to help the cough but due to age and side effects despite good functional status she opts to take a low-dose of prednisone. Currently she is on 5 mg prednisone. In November 2015 due to RSI score of 27 for cough but decided to trial gabapentin to tackle the neurogenic component of cough. She reports now that the gabapentin has not helped her at all although objectively the RSI cough score has drop to 20. She is very frustrated. She and her husband are reluctant to have right middle lobectomy given her advanced age despite good functional status. There are no open to trying methotrexate to tackle the inflammation of sarcoidosis and as a steroid sparing agent. She also wants to come off gabapentin   OV 10/02/2014  Chief Complaint  Patient presents with  . Follow-up    Pt states her breathing is doing well. Pt states her SOB has improved since last OV. Pt c/o mild cough. Pt denies CP/tightness. Pt stated being on methotrexate has significantly helped.     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 Main symptom is severe  chronic cough with high RSI score suggestive of irritable larynx syndrome that is associated as well    Last visit in January 2016 she told me that gabapentin did not help her cough. So we stopped this. We started methotrexate for inflammation in her airway due to  sarcoid causing right middle lobe collapse. We left her on prednisone 5 mg per day and started her on methotrexate 5 mg once a week. Also gave Hycodan cough syrup. She says that the Hycodan cough syrup did not help However, 2 weeks after starting her methotrexate she started noticing dramatic improvement in her cough. She hardly coughs now. She has significant improvement in her quality of life and she is extremely happy.  RSI cough score had dropped from 25 to 20 with gabapentin has now dropped to 5.  She is asking questions about how long to take methotrexate. And also review of the side effect profile. Of note I did notice that she is on ACE inhibitor that can potentially provoke all make her cough worse. She is not taking gabapentin anymore. There is no fever or chills. At this point in time she's not interested in Bactrim PCP prophylaxis which should be okay given low-dose of methotrexate and prednisone  Past, Family, Social reviewed: no change since last visit       has a past medical history of Hypertension; High cholesterol; Cervical spondylosis; Arthritis; DVT (deep venous thrombosis) (2006); Chest pain (07/28/2008);  Dislocation closed, shoulder (7/14); and Collapsed lung ( 12/11).   reports that she has never smoked. She has never used smokeless tobacco.  Past Surgical History  Procedure Laterality Date  . Hemorrhoid surgery  12/1972  . Foot surgery  06/2005  . Intraocular lens insertion      right eye 10 /16 and left 06/16/2005  . Skin biopsy  01/2007    basal cell carcinoma removed from right lower leg   . Total hip arthroplasty  06/2007    left  . Total knee arthroplasty  10/2007    right    No Known  Allergies  Immunization History  Administered Date(s) Administered  . Influenza Split 04/11/2013, 04/11/2014  . Influenza Whole 04/15/2010  . Pneumococcal Polysaccharide-23 05/13/2004  . Pneumococcal-Unspecified 08/11/2010, 03/11/2014  . Td 02/08/2002  . Zoster 06/12/2007    Family History  Problem Relation Age of Onset  . Diabetes Mother   . Heart failure Mother   . Hypertension Father   . Diabetes Father   . Heart disease Father   . Heart attack Father      Current outpatient prescriptions:  .  amLODipine-benazepril (LOTREL) 10-20 MG per capsule, Take 1 capsule by mouth every morning. , Disp: , Rfl:  .  azelastine (OPTIVAR) 0.05 % ophthalmic solution, Place 1 drop into both eyes every morning., Disp: , Rfl:  .  bisacodyl (DULCOLAX) 5 MG EC tablet, Take 5 mg by mouth daily as needed for constipation. For constipation, Disp: , Rfl:  .  Calcium Carbonate-Vitamin D (CALCIUM + D PO), Take 1 tablet by mouth 2 (two) times daily., Disp: , Rfl:  .  Efinaconazole (JUBLIA) 10 % SOLN, Apply topically., Disp: , Rfl:  .  folic acid (FOLVITE) 1 MG tablet, Take 1 tablet (1 mg total) by mouth daily., Disp: 30 tablet, Rfl: 3 .  gemfibrozil (LOPID) 600 MG tablet, Take 600 mg by mouth 2 (two) times daily before a meal., Disp: , Rfl:  .  HYDROcodone-homatropine (HYCODAN) 5-1.5 MG/5ML syrup, Take 5 mLs by mouth 2 (two) times daily., Disp: 300 mL, Rfl: 0 .  ibuprofen (ADVIL,MOTRIN) 200 MG tablet, Take 600 mg by mouth daily as needed. For pain, Disp: , Rfl:  .  levothyroxine (SYNTHROID, LEVOTHROID) 25 MCG tablet, Take 1 tablet by mouth daily., Disp: , Rfl:  .  methotrexate (RHEUMATREX) 5 MG tablet, Take 1 tablet (5 mg total) by mouth once a week. On Mondays or Sundays.  Caution: Chemotherapy. Protect from light., Disp: 4 tablet, Rfl: 0 .  Multiple Vitamin (MULTIVITAMIN WITH MINERALS) TABS, Take 1 tablet by mouth daily., Disp: , Rfl:  .  predniSONE (DELTASONE) 5 MG tablet, Take 1 tablet (5 mg total) by  mouth daily with breakfast., Disp: 90 tablet, Rfl: 3     Review of Systems  Constitutional: Negative for fever and unexpected weight change.  HENT: Negative for congestion, dental problem, ear pain, nosebleeds, postnasal drip, rhinorrhea, sinus pressure, sneezing, sore throat and trouble swallowing.   Eyes: Negative for redness and itching.  Respiratory: Positive for cough and shortness of breath. Negative for chest tightness and wheezing.   Cardiovascular: Negative for palpitations and leg swelling.  Gastrointestinal: Negative for nausea and vomiting.  Genitourinary: Negative for dysuria.  Musculoskeletal: Negative for joint swelling.  Skin: Negative for rash.  Neurological: Negative for headaches.  Hematological: Does not bruise/bleed easily.  Psychiatric/Behavioral: Negative for dysphoric mood. The patient is not nervous/anxious.        Objective:   Physical Exam  Filed Vitals:   10/02/14 1523  BP: 158/60  Pulse: 73  Height: 5\' 2"  (1.575 m)  Weight: 143 lb (64.864 kg)  SpO2: 98%          Assessment & Plan:     ICD-9-CM ICD-10-CM   1. Sarcoidosis 135 D86.9 CBC     Hepatic function panel     Basic Metabolic Panel (BMET)  2. 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 Chronic cough 786.2 R05   4. Encounter for therapeutic drug monitoring V58.83 Z51.81    Of all different treatments for cough including gabapentin and Hycodan it is methotrexate that helped her cough significantly suggesting its the airway inflammation from sarcoidosis and right middle lobe syndrome that was contributing to her cough. She's done well even with 5 mg once a week of methotrexate. However, I did notice that she is on ACE inhibitor.   REC Glad you are much better Talk to your PCP ARONSON,RICHARD A, MD and stop Benazepril component in your LOTREL - this can make cough worse Continue prednisone at 5 mg once daily Continue  methotrexate 5 mg tablet once a week on Mondays or Sundays Continue folic acid 1 mg once daily  For now hold off on bactrim prophylaxis  Do CBC, BMET and LFT today   Followup - 3 months   - cough score in 3 months  - CBC, BMET, LFT in 3 months  At follow-up depending on response, we can decrease prednisone dose    Dr. Brand Males, M.D., [facility].C.P Pulmonary and Critical Care Medicine Staff Physician [place] [facility] Pager: [phone], If no answer or between  15:00h - 7:00h: call [phone]  10/03/2014 5:45 AM  '

## 2014-10-03 DIAGNOSIS — Z5181 Encounter for therapeutic drug level monitoring: Secondary | ICD-10-CM | POA: Insufficient documentation

## 2014-10-03 HISTORY — DX: Encounter for therapeutic drug level monitoring: Z51.81

## 2014-10-03 LAB — BASIC METABOLIC PANEL
BUN: 18 mg/dL (ref 6–23)
CO2: 26 mEq/L (ref 19–32)
Calcium: 9.6 mg/dL (ref 8.4–10.5)
Chloride: 105 mEq/L (ref 96–112)
Creatinine, Ser: 0.67 mg/dL (ref 0.40–1.20)
GFR: 89.71 mL/min (ref >60.00)
GLUCOSE: 108 mg/dL — AB (ref 70–99)
POTASSIUM: 4.8 meq/L (ref 3.5–5.1)
SODIUM: 138 meq/L (ref 135–145)

## 2014-10-03 LAB — HEPATIC FUNCTION PANEL
ALT: 11 U/L (ref 0–35)
AST: 17 U/L (ref 0–37)
Albumin: 4.4 g/dL (ref 3.5–5.2)
Alkaline Phosphatase: 64 U/L (ref 39–117)
Bilirubin, Direct: 0.1 mg/dL (ref 0.0–0.3)
Total Bilirubin: 0.3 mg/dL (ref 0.2–1.2)
Total Protein: 7.7 g/dL (ref 6.0–8.3)

## 2014-11-02 ENCOUNTER — Telehealth: Payer: Self-pay | Admitting: Internal Medicine

## 2014-11-02 DIAGNOSIS — D869 Sarcoidosis, unspecified: Secondary | ICD-10-CM | POA: Diagnosis not present

## 2014-11-02 MED ORDER — METHOTREXATE SODIUM 5 MG PO TABS: 5.0000 mg | ORAL_TABLET | ORAL | Status: DC

## 2014-11-02 MED ORDER — HYDROCODONE-HOMATROPINE 5-1.5 MG/5ML PO SYRP: 5.0000 mL | ORAL_SOLUTION | Freq: Two times a day (BID) | ORAL | Status: DC

## 2014-11-02 NOTE — Telephone Encounter (Signed)
RX has been printed and will place upfront for pick up. Pt is aware and will pick up next week. Nothing further needed

## 2014-11-02 NOTE — Telephone Encounter (Signed)
I have sent in a refill on Methotrexate.  MR - please advise on Hycodan. Thanks.

## 2014-11-02 NOTE — Telephone Encounter (Signed)
[place] for refill on hycodan

## 2014-11-22 DIAGNOSIS — H5340 Unspecified visual field defects: Secondary | ICD-10-CM | POA: Diagnosis not present

## 2014-11-22 DIAGNOSIS — H26491 Other secondary cataract, right eye: Secondary | ICD-10-CM | POA: Diagnosis not present

## 2014-12-07 DIAGNOSIS — M79675 Pain in left toe(s): Secondary | ICD-10-CM | POA: Diagnosis not present

## 2014-12-07 DIAGNOSIS — M79674 Pain in right toe(s): Secondary | ICD-10-CM | POA: Diagnosis not present

## 2014-12-13 ENCOUNTER — Other Ambulatory Visit: Payer: Self-pay | Admitting: Internal Medicine

## 2014-12-14 DIAGNOSIS — H2640 Unspecified secondary cataract: Secondary | ICD-10-CM | POA: Diagnosis not present

## 2014-12-14 DIAGNOSIS — H26491 Other secondary cataract, right eye: Secondary | ICD-10-CM | POA: Diagnosis not present

## 2015-01-01 ENCOUNTER — Telehealth: Payer: Self-pay | Admitting: Internal Medicine

## 2015-01-01 DIAGNOSIS — H47093 Other disorders of optic nerve, not elsewhere classified, bilateral: Secondary | ICD-10-CM | POA: Diagnosis not present

## 2015-01-01 NOTE — Telephone Encounter (Signed)
Pt's ophthalmologist called, Dr. Ellie Lunch. Dr. Ellie Lunch stated pt is in her office now and pt's sarcoid has infiltrated pt's visual field and affecting pt's right optic nerve and pt is experiencing decreased vision. Dr. Ellie Lunch was requesting to speak with MR regarding pt's treatment plan regarding her vision. Dr. Ellie Lunch requested to speak with MR today (5/23) and can be reached at [phone].  MR please advise.

## 2015-01-02 ENCOUNTER — Encounter: Payer: Self-pay | Admitting: Internal Medicine

## 2015-01-02 ENCOUNTER — Ambulatory Visit (INDEPENDENT_AMBULATORY_CARE_PROVIDER_SITE_OTHER): Payer: Medicare Other | Admitting: Internal Medicine

## 2015-01-02 ENCOUNTER — Other Ambulatory Visit (INDEPENDENT_AMBULATORY_CARE_PROVIDER_SITE_OTHER): Payer: Medicare Other

## 2015-01-02 VITALS — BP 138/80 | HR 64 | Ht 62.0 in | Wt 144.0 lb

## 2015-01-02 DIAGNOSIS — Z5181 Encounter for therapeutic drug level monitoring: Secondary | ICD-10-CM

## 2015-01-02 DIAGNOSIS — R21 Rash and other nonspecific skin eruption: Secondary | ICD-10-CM

## 2015-01-02 DIAGNOSIS — H53459 Other localized visual field defect, unspecified eye: Secondary | ICD-10-CM

## 2015-01-02 DIAGNOSIS — J9819 Other pulmonary collapse: Secondary | ICD-10-CM | POA: Diagnosis not present

## 2015-01-02 DIAGNOSIS — R05 Cough: Secondary | ICD-10-CM

## 2015-01-02 DIAGNOSIS — H53451 Other localized visual field defect, right eye: Secondary | ICD-10-CM

## 2015-01-02 DIAGNOSIS — D869 Sarcoidosis, unspecified: Secondary | ICD-10-CM | POA: Diagnosis not present

## 2015-01-02 DIAGNOSIS — R053 Chronic cough: Secondary | ICD-10-CM

## 2015-01-02 HISTORY — DX: Rash and other nonspecific skin eruption: R21

## 2015-01-02 HISTORY — DX: Other localized visual field defect, unspecified eye: H53.459

## 2015-01-02 LAB — HEPATIC FUNCTION PANEL
ALK PHOS: 59 U/L (ref 39–117)
ALT: 13 U/L (ref 0–35)
AST: 19 U/L (ref 0–37)
Albumin: 4.4 g/dL (ref 3.5–5.2)
BILIRUBIN DIRECT: 0.1 mg/dL (ref 0.0–0.3)
Total Bilirubin: 0.5 mg/dL (ref 0.2–1.2)
Total Protein: 7.8 g/dL (ref 6.0–8.3)

## 2015-01-02 LAB — BASIC METABOLIC PANEL
BUN: 17 mg/dL (ref 6–23)
CO2: 27 mEq/L (ref 19–32)
Calcium: 9.5 mg/dL (ref 8.4–10.5)
Chloride: 102 mEq/L (ref 96–112)
Creatinine, Ser: 0.76 mg/dL (ref 0.40–1.20)
GFR: 77.52 mL/min (ref >60.00)
Glucose, Bld: 86 mg/dL (ref 70–99)
Potassium: 3.9 mEq/L (ref 3.5–5.1)
SODIUM: 137 meq/L (ref 135–145)

## 2015-01-02 LAB — CBC
HCT: 37.2 % (ref 36.0–46.0)
HEMOGLOBIN: 13 g/dL (ref 12.0–15.0)
MCHC: 35 g/dL (ref 30.0–36.0)
MCV: 97.7 fl (ref 78.0–100.0)
Platelets: 240 10*3/uL (ref 150.0–400.0)
RBC: 3.81 Mil/uL — ABNORMAL LOW (ref 3.87–5.11)
RDW: 14.9 % (ref 11.5–15.5)
WBC: 3.5 10*3/uL — ABNORMAL LOW (ref 4.0–10.5)

## 2015-01-02 MED ORDER — HYDROCODONE-HOMATROPINE 5-1.5 MG/5ML PO SYRP: 5.0000 mL | ORAL_SOLUTION | Freq: Two times a day (BID) | ORAL | Status: DC

## 2015-01-02 NOTE — Progress Notes (Signed)
Subjective:    Patient ID: Ernesto Rutherford, female    DOB: Feb 23, 1931, 81 y.o.   MRN: [id number]  HPI   OV 10/02/2014  Chief Complaint  Patient presents with  . Follow-up    Pt states her breathing is doing well. Pt states her SOB has improved since last OV. Pt c/o mild cough. Pt denies CP/tightness. Pt stated being on methotrexate has significantly helped.      OV 08/24/2014  Chief Complaint  Patient presents with  . Follow-up    Pt stated the gabapentin has not changed her breathing or cough. Pt c/o prod cough with white mucus. Pt denies SOB and CP/tightness.    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 Main symptom is severe chronic cough with high RSI score suggestive of irritable larynx syndrome that is associated as well  She always finds prednisone to help the cough but due to age and side effects despite good functional status she opts to take a low-dose of prednisone. Currently she is on 5 mg prednisone. In November 2015 due to RSI score of 27 for cough but decided to trial gabapentin to tackle the neurogenic component of cough. She reports now that the gabapentin has not helped her at all although objectively the RSI cough score has drop to 20. She is very frustrated. She and her husband are reluctant to have right middle lobectomy given her advanced age despite good functional status. There are no open to trying methotrexate to tackle the inflammation of sarcoidosis and as a steroid sparing agent. She also wants to come off gabapentin   OV 10/02/2014  Chief Complaint  Patient presents with  . Follow-up    Pt states her breathing is doing well. Pt states her SOB has improved since last OV. Pt c/o mild cough. Pt denies CP/tightness. Pt stated being on methotrexate has significantly helped.     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 Main symptom is severe chronic  cough with high RSI score suggestive of irritable larynx syndrome that is associated as well    Last visit in January 2016 she told me that gabapentin did not help her cough. So we stopped this. We started methotrexate for inflammation in her airway due to  sarcoid causing right middle lobe collapse. We left her on prednisone 5 mg per day and started her on methotrexate 5 mg once a week. Also gave Hycodan cough syrup. She says that the Hycodan cough syrup did not help However, 2 weeks after starting her methotrexate she started noticing dramatic improvement in her cough. She hardly coughs now. She has significant improvement in her quality of life and she is extremely happy.  RSI cough score had dropped from 25 to 20 with gabapentin has now dropped to 5.  She is asking questions about how long to take methotrexate. And also review of the side effect profile. Of note I did notice that she is on ACE inhibitor that can potentially provoke all make her cough worse. She is not taking gabapentin anymore. There is no fever or chills. At this point in time she's not interested in Bactrim PCP prophylaxis which should be okay given low-dose of methotrexate and prednisone  Past, Family, Social reviewed: no change since last visit    OV 01/02/2015  Chief Complaint  Patient presents with  . Follow-up    Pt recently went to opthamologist and pts vision has decreased since  last OV. Pt stated her breathing is unchanged since last OV. Pt c/o mild dry cough. Pt denies CP/tightness.     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 Main symptom is severe chronic cough with high RSI score suggestive of irritable larynx syndrome that is associated as well  Cough /Saarcoid / IRritiable LArynx/ RML syundrome - cough persists but is only mild and baseline. No problems. Does not feel it impacts qualkity of life. She is off ace inhibitor now  ; forgot to mention this  during med reconciliation. No asociated wheeze or dyspnea. Maintained on mehtotrxate 5mg  weekly, folic acid and prednisone 5mg  daily. No evidence of infection. Last LFT was 3 months ago and normal. Hycodan helping cough and wants refill  NEw issue - her eye doc Dr Ellie Lunch called yesterday. PAtient has no complaints of vision but post capsulootmy few week ago and now yesterday visual field deficit on Rt side is worse. Concern is ocular sarcoid is worse. They are referring her to [place]. PAtient does not want step up Rx of sarcoid till all this is sorted out  New issue - noticed some bruising in left forearm yesterday sponatenous x 2 spots. New. Asytmptomatcic and last 1 weeks some punctate red lesion in distal 1/3 of leg. Associatd with gardening but denies poison ivy. Unchanged since insidious onset. No associatd fever.       Current outpatient prescriptions:  .  amLODipine-benazepril (LOTREL) 10-20 MG per capsule, Take 1 capsule by mouth every morning. , Disp: , Rfl:  .  azelastine (OPTIVAR) 0.05 % ophthalmic solution, Place 1 drop into both eyes every morning., Disp: , Rfl:  .  bisacodyl (DULCOLAX) 5 MG EC tablet, Take 5 mg by mouth daily as needed for constipation. For constipation, Disp: , Rfl:  .  Calcium Carbonate-Vitamin D (CALCIUM + D PO), Take 1 tablet by mouth 2 (two) times daily., Disp: , Rfl:  .  Efinaconazole (JUBLIA) 10 % SOLN, Apply topically., Disp: , Rfl:  .  folic acid (FOLVITE) 1 MG tablet, TAKE 1 TABLET (1 MG TOTAL) BY MOUTH DAILY., Disp: 30 tablet, Rfl: 2 .  gemfibrozil (LOPID) 600 MG tablet, Take 600 mg by mouth 2 (two) times daily before a meal., Disp: , Rfl:  .  HYDROcodone-homatropine (HYCODAN) 5-1.5 MG/5ML syrup, Take 5 mLs by mouth 2 (two) times daily., Disp: 300 mL, Rfl: 0 .  ibuprofen (ADVIL,MOTRIN) 200 MG tablet, Take 600 mg by mouth daily as needed. For pain, Disp: , Rfl:  .  levothyroxine (SYNTHROID, LEVOTHROID) 25 MCG tablet, Take 1 tablet by mouth daily., Disp: ,  Rfl:  .  methotrexate (RHEUMATREX) 5 MG tablet, Take 1 tablet (5 mg total) by mouth once a week. On Mondays or Sundays.  Caution: Chemotherapy. Protect from light., Disp: 12 tablet, Rfl: 2 .  Multiple Vitamin (MULTIVITAMIN WITH MINERALS) TABS, Take 1 tablet by mouth daily., Disp: , Rfl:  .  predniSONE (DELTASONE) 5 MG tablet, Take 1 tablet (5 mg total) by mouth daily with breakfast., Disp: 90 tablet, Rfl: 3    Review of Systems  Constitutional: Negative for fever and unexpected weight change.  HENT: Negative for congestion, dental problem, ear pain, nosebleeds, postnasal drip, rhinorrhea, sinus pressure, sneezing, sore throat and trouble swallowing.   Eyes: Negative for redness and itching.  Respiratory: Positive for cough and shortness of breath. Negative for chest tightness and wheezing.   Cardiovascular: Negative for palpitations and leg swelling.  Gastrointestinal: Negative for nausea  and vomiting.  Genitourinary: Negative for dysuria.  Musculoskeletal: Negative for joint swelling.  Skin: Negative for rash.  Neurological: Negative for headaches.  Hematological: Does not bruise/bleed easily.  Psychiatric/Behavioral: Negative for dysphoric mood. The patient is not nervous/anxious.        Objective:   Physical Exam  Constitutional: She is oriented to person, place, and time. She appears well-developed and well-nourished. No distress.  HENT:  Head: Normocephalic and atraumatic.  Right Ear: External ear normal.  Left Ear: External ear normal.  Mouth/Throat: Oropharynx is clear and moist. No oropharyngeal exudate.  occ perioidic clearing of the throat  Eyes: Conjunctivae and EOM are normal. Pupils are equal, round, and reactive to light. Right eye exhibits no discharge. Left eye exhibits no discharge. No scleral icterus.  Neck: Normal range of motion. Neck supple. No JVD present. No tracheal deviation present. No thyromegaly present.  Cardiovascular: Normal rate, regular rhythm,  normal heart sounds and intact distal pulses.  Exam reveals no gallop and no friction rub.   No murmur heard. Pulmonary/Chest: Effort normal and breath sounds normal. No respiratory distress. She has no wheezes. She has no rales. She exhibits no tenderness.  Abdominal: Soft. Bowel sounds are normal. She exhibits no distension and no mass. There is no tenderness. There is no rebound and no guarding.  Musculoskeletal: Normal range of motion. She exhibits no edema or tenderness.  Lymphadenopathy:    She has no cervical adenopathy.  Neurological: She is alert and oriented to person, place, and time. She has normal reflexes. No cranial nerve deficit. She exhibits normal muscle tone. Coordination normal.  Skin: Skin is warm and dry. No rash noted. She is not diaphoretic. No erythema. No pallor.  Small bruising left forearm near wrist Small punctate red lesion on left distal 1/3 LE posteriorly  Psychiatric: She has a normal mood and affect. Her behavior is normal. Judgment and thought content normal.  Vitals reviewed.   Filed Vitals:   01/02/15 0854  BP: 138/80  Pulse: 64  Height: 5\' 2"  (1.575 m)  Weight: 144 lb (65.318 kg)  SpO2: 98%         Assessment & Plan:     ICD-9-CM ICD-10-CM   1. Sarcoidosis 135 D86.9   2. 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 Chronic cough 786.2 R05   4. Encounter for therapeutic drug monitoring V58.83 Z51.81   5. Rash and nonspecific skin eruption 782.1 R21   6. Visual field defect nasal step, right 368.44 H53.451   #Sarcoidosis - with Right Middle Lobe Syndrome and cough #CHronic cough - due to above + Irritable larynd #ENcounter for therapeutic monitoring   - disease is stable  - glad you are off ace inhibitor  - Continue prednisone at 5 mg once daily  - Continue methotrexate 5 mg tablet once a week on Mondays or Sundays  - Continue folic acid 1 mg once daily  - Continue hydrocodone opioid agent (hycodan) for symptom relieve  - For now hold  off on bactrim prophylaxis  -  Do CBC, BMET and LFT today   #Rash left lower extremity and bruise left forearm  - monitor clinically as discussed (for now) - if worsens/persists need derm input  #Right visual deficit - new issue  - discussed with Dr Ellie Lunch - please follow at [facility] and if they feel you need step up therapy for sarcoid Cherylynn Ridges will do it  Followup - 3 months   - cough score in 3 months  Dr. Brand Males, M.D., [facility].C.P Pulmonary and Critical Care Medicine Staff Physician [place] [facility] Pager: [phone], If no answer or between  15:00h - 7:00h: call [phone]  01/02/2015 9:16 AM

## 2015-01-02 NOTE — Patient Instructions (Addendum)
#  Sarcoidosis - with Right Middle Lobe Syndrome and cough #CHronic cough - due to above + Irritable larynd #ENcounter for therapeutic monitoring   - disease is stable  - glad you are off ace inhibitor  - Continue prednisone at 5 mg once daily  - Continue methotrexate 5 mg tablet once a week on Mondays or Sundays  - Continue folic acid 1 mg once daily  - Continue hydrocodone (hycodan) for symptom relieve  - For now hold off on bactrim prophylaxis  -  Do CBC, BMET and LFT today   #Rash left lower extremity and bruise left forearm  - monitor clinically  #Right visual deficit  - discussed with Dr Ellie Lunch - please follow at [facility] and if they feel you need step up therapy for sarcoid Cherylynn Ridges will do it  Followup - 3 months   - cough score in 3 months

## 2015-01-03 ENCOUNTER — Telehealth: Payer: Self-pay | Admitting: Internal Medicine

## 2015-01-03 DIAGNOSIS — Z5181 Encounter for therapeutic drug level monitoring: Secondary | ICD-10-CM

## 2015-01-03 DIAGNOSIS — D869 Sarcoidosis, unspecified: Secondary | ICD-10-CM

## 2015-01-03 NOTE — Telephone Encounter (Signed)
Spoke to dr Harl Bowie and documented in my ov yesterday

## 2015-01-03 NOTE — Telephone Encounter (Signed)
Pt aware of rec's per MR, lab work ordered with expected date the week of June 13th.  Pt aware to come have blood work done around that week to recheck CBC and in the meantime stay on MTX.  Nothing further needed.

## 2015-01-03 NOTE — Telephone Encounter (Signed)
Let her know that wbc has halved from baseline and this could be due to methotrexate. AT this pooint given concern for ocular sarcoid and mtx helping cough I do not want to dc mtx but instead repeat cbc in 3 weeks and then we decide   Recent Labs Lab 01/02/15 0922  HGB 13.0  HCT 37.2  WBC 3.5*  PLT 240.0

## 2015-01-04 ENCOUNTER — Other Ambulatory Visit: Payer: Self-pay | Admitting: Ophthalmology

## 2015-01-04 DIAGNOSIS — H469 Unspecified optic neuritis: Secondary | ICD-10-CM

## 2015-01-15 ENCOUNTER — Telehealth: Payer: Self-pay | Admitting: Internal Medicine

## 2015-01-15 NOTE — Telephone Encounter (Signed)
Spoke with pt and states that she had a rash that started 3-4 days ago. States it is on 1 leg and has started on the back of the other leg. She is wanting you to look at it.   MR please advise.

## 2015-01-15 NOTE — Telephone Encounter (Signed)
Pt informed to email pic of rash with instructions on how to email and where to email to per MR. Nothing further needed at this time.

## 2015-01-15 NOTE — Telephone Encounter (Signed)
Let her know to email me the pic [name] but let her know because email traffic is from [employer] to label "secure" on heading. Even then she has to assume risk for any data loss during traffic. As long she is ok with that - there is no federal violation in her sending me the email

## 2015-01-16 ENCOUNTER — Ambulatory Visit
Admission: RE | Admit: 2015-01-16 | Discharge: 2015-01-16 | Disposition: A | Discharge status: Home / Self Care | Payer: Medicare Other | Source: Ambulatory Visit | Attending: Ophthalmology | Admitting: Ophthalmology

## 2015-01-16 DIAGNOSIS — H468 Other optic neuritis: Secondary | ICD-10-CM | POA: Diagnosis not present

## 2015-01-16 DIAGNOSIS — H469 Unspecified optic neuritis: Secondary | ICD-10-CM

## 2015-01-16 MED ORDER — GADOBENATE DIMEGLUMINE 529 MG/ML IV SOLN
10.0000 mL | Freq: Once | INTRAVENOUS | Status: AC | PRN
  Administered 2015-01-16: 10 mL via INTRAVENOUS

## 2015-01-17 ENCOUNTER — Telehealth: Payer: Self-pay | Admitting: Internal Medicine

## 2015-01-17 DIAGNOSIS — R21 Rash and other nonspecific skin eruption: Secondary | ICD-10-CM

## 2015-01-17 NOTE — Telephone Encounter (Signed)
Yes please get her an appt with her own dermatoliogist Dr Wilhemina Bonito at [facility]. She likely needs biopsy. That practice tends to run very busy so a call from [place] would help a lot

## 2015-01-17 NOTE — Telephone Encounter (Signed)
Spoke with pt. States that she has been emailing back and forth with MR. He told her that he would refer her to dermatology.  MR - please advise. Thanks.

## 2015-01-17 NOTE — Telephone Encounter (Signed)
Patient went by the Dermatology office today and they told her they couldn't get her in until July.  She needs an appointment ASAP, her rash is worse.  Per MR, placed order for ASAP appointment with [facility] Dermatology for biopsy of rash.  Patient notified. Nothing further needed.

## 2015-01-18 DIAGNOSIS — L308 Other specified dermatitis: Secondary | ICD-10-CM | POA: Diagnosis not present

## 2015-01-18 DIAGNOSIS — D863 Sarcoidosis of skin: Secondary | ICD-10-CM | POA: Diagnosis not present

## 2015-01-19 ENCOUNTER — Telehealth: Payer: Self-pay | Admitting: Internal Medicine

## 2015-01-19 NOTE — Telephone Encounter (Signed)
Ok thanks much. Encounter closed. No need to reply

## 2015-01-19 NOTE — Telephone Encounter (Signed)
Dr Jarome Matin 01/18/2015 1100  Patient says that Dr. Ronnald Ramp thinks the rash is due to Sarcoid.  She was prescribed a cream to help with the itchiness of the rash.  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 He did do Biopsy and will know the biopsy results next week. Patient says she is coming in on Monday to have her blood test done.   Patient will be out of town next Thursday and Friday and will not be back in town until the following Monday. (6/20)

## 2015-01-19 NOTE — Telephone Encounter (Signed)
Guys  I never got feedback on when her derm appt is. Please let me know. IF her regular derm cannot see her I will have to see if someeone else can see her  Thanks  Dr. Brand Males, M.D., [facility].C.P Pulmonary and Critical Care Medicine Staff Physician [place] [facility] Pager: [phone], If no answer or between  15:00h - 7:00h: call [phone]  01/19/2015 3:17 PM

## 2015-01-22 ENCOUNTER — Telehealth: Payer: Self-pay | Admitting: Internal Medicine

## 2015-01-22 ENCOUNTER — Other Ambulatory Visit (INDEPENDENT_AMBULATORY_CARE_PROVIDER_SITE_OTHER): Payer: Medicare Other

## 2015-01-22 DIAGNOSIS — D869 Sarcoidosis, unspecified: Secondary | ICD-10-CM

## 2015-01-22 DIAGNOSIS — Z5181 Encounter for therapeutic drug level monitoring: Secondary | ICD-10-CM | POA: Diagnosis not present

## 2015-01-22 DIAGNOSIS — D582 Other hemoglobinopathies: Secondary | ICD-10-CM

## 2015-01-22 LAB — CBC
HCT: 33.2 % — ABNORMAL LOW (ref 36.0–46.0)
HEMOGLOBIN: 11.5 g/dL — AB (ref 12.0–15.0)
MCHC: 34.5 g/dL (ref 30.0–36.0)
MCV: 99.8 fl (ref 78.0–100.0)
Platelets: 201 10*3/uL (ref 150.0–400.0)
RBC: 3.33 Mil/uL — ABNORMAL LOW (ref 3.87–5.11)
RDW: 14.4 % (ref 11.5–15.5)
WBC: 4.3 10*3/uL (ref 4.0–10.5)

## 2015-01-22 NOTE — Telephone Encounter (Signed)
  Recent Labs Lab 01/22/15 0942  HGB 11.5*  HCT 33.2*  WBC 4.3  PLT 201.0    Wbc has improved to 4.5K but hgb has dropped from 12.4gm% in feb and 13gm% in May to 11.5gm% IS she bleeding anywhere?  Plan Repeat cbc in 1 week Stay on methotrexate and folic acid  Dr. Brand Males, M.D., [facility].C.P Pulmonary and Critical Care Medicine Staff Physician [place] [facility] Pager: [phone], If no answer or between  15:00h - 7:00h: call [phone]  01/22/2015 12:05 PM

## 2015-01-22 NOTE — Telephone Encounter (Signed)
Patient notified of lab results.  Order entered for CBC.    Reminder:  Patient will be out of town Thursday and Friday of this week.  She would like to know results of biopsy done at Dr. Ronnald Ramp' office.    MR - have you received biopsy results yet?  Please advise.

## 2015-01-23 DIAGNOSIS — H47093 Other disorders of optic nerve, not elsewhere classified, bilateral: Secondary | ICD-10-CM | POA: Diagnosis not present

## 2015-01-28 NOTE — Telephone Encounter (Signed)
Please get hold of biopsy report for [name] Page from Dr Zadie Cleverly [facility] dermatology) and place in my look at

## 2015-01-29 ENCOUNTER — Telehealth: Payer: Self-pay | Admitting: Internal Medicine

## 2015-01-29 ENCOUNTER — Other Ambulatory Visit (INDEPENDENT_AMBULATORY_CARE_PROVIDER_SITE_OTHER): Payer: Medicare Other

## 2015-01-29 DIAGNOSIS — D582 Other hemoglobinopathies: Secondary | ICD-10-CM

## 2015-01-29 DIAGNOSIS — D869 Sarcoidosis, unspecified: Secondary | ICD-10-CM

## 2015-01-29 LAB — CBC WITH DIFFERENTIAL/PLATELET
Basophils Absolute: 0 10*3/uL (ref 0.0–0.1)
Basophils Relative: 0.6 % (ref 0.0–3.0)
EOS PCT: 2.6 % (ref 0.0–5.0)
Eosinophils Absolute: 0.1 10*3/uL (ref 0.0–0.7)
HEMATOCRIT: 34.6 % — AB (ref 36.0–46.0)
Hemoglobin: 11.7 g/dL — ABNORMAL LOW (ref 12.0–15.0)
LYMPHS ABS: 1.1 10*3/uL (ref 0.7–4.0)
Lymphocytes Relative: 29.6 % (ref 12.0–46.0)
MCHC: 33.9 g/dL (ref 30.0–36.0)
MCV: 100.7 fl — ABNORMAL HIGH (ref 78.0–100.0)
MONOS PCT: 15.2 % — AB (ref 3.0–12.0)
Monocytes Absolute: 0.6 10*3/uL (ref 0.1–1.0)
Neutro Abs: 2 10*3/uL (ref 1.4–7.7)
Neutrophils Relative %: 52 % (ref 43.0–77.0)
PLATELETS: 247 10*3/uL (ref 150.0–400.0)
RBC: 3.44 Mil/uL — ABNORMAL LOW (ref 3.87–5.11)
RDW: 14.6 % (ref 11.5–15.5)
WBC: 3.8 10*3/uL — AB (ref 4.0–10.5)

## 2015-01-29 NOTE — Telephone Encounter (Signed)
lmtcb X1 for medical records at [facility] dermatology to fax biopsy results to main fax #.

## 2015-01-29 NOTE — Telephone Encounter (Signed)
Notes Recorded by Inge Rise, CMA on 01/29/2015 at 2:30 PM lmomtcb x1 Notes Recorded by Brand Males, MD on 01/29/2015 at 10:27 AM Lab       01/29/15           0924     HGB     11.7*     HCT     34.6*     WBC     3.8*     PLT     247.0      Hgb and WBC within stable range from last week. Repeat CBC in 1 month. No change in Rx. Still awaiting skin biopsy reslt -------- Pt is aware of results.

## 2015-01-30 NOTE — Telephone Encounter (Signed)
lmom x 1 at [facility] dermatology to fax biopsy results to [phone]

## 2015-02-07 ENCOUNTER — Telehealth: Payer: Self-pay | Admitting: Internal Medicine

## 2015-02-07 NOTE — Telephone Encounter (Signed)
Skin bx report reviewed: they are seeing dermatitis and not sarcoidosis on path. Derm clinico path correlation needed -s he has to d/w with her dermatologist

## 2015-02-08 NOTE — Telephone Encounter (Signed)
lmtcb for pt.  

## 2015-02-09 NOTE — Telephone Encounter (Signed)
Called and spoke to pt. Informed her of the results and recs per MR. Pt verbalized understanding and denied any further questions or concerns at this time.   

## 2015-02-11 ENCOUNTER — Encounter: Payer: Self-pay | Admitting: Internal Medicine

## 2015-02-13 NOTE — Telephone Encounter (Signed)
MR please advise. Thanks! 

## 2015-02-20 ENCOUNTER — Encounter: Payer: Self-pay | Admitting: Internal Medicine

## 2015-02-26 ENCOUNTER — Other Ambulatory Visit: Payer: Self-pay | Admitting: Internal Medicine

## 2015-02-26 ENCOUNTER — Encounter: Payer: Self-pay | Admitting: Internal Medicine

## 2015-02-26 ENCOUNTER — Other Ambulatory Visit (INDEPENDENT_AMBULATORY_CARE_PROVIDER_SITE_OTHER): Payer: Medicare Other

## 2015-02-26 DIAGNOSIS — D869 Sarcoidosis, unspecified: Secondary | ICD-10-CM

## 2015-02-26 LAB — CBC WITH DIFFERENTIAL/PLATELET
BASOS ABS: 0 10*3/uL (ref 0.0–0.1)
BASOS PCT: 0.3 % (ref 0.0–3.0)
EOS ABS: 0.1 10*3/uL (ref 0.0–0.7)
Eosinophils Relative: 2.1 % (ref 0.0–5.0)
HCT: 34 % — ABNORMAL LOW (ref 36.0–46.0)
Hemoglobin: 11.8 g/dL — ABNORMAL LOW (ref 12.0–15.0)
LYMPHS ABS: 0.9 10*3/uL (ref 0.7–4.0)
Lymphocytes Relative: 22.2 % (ref 12.0–46.0)
MCHC: 34.6 g/dL (ref 30.0–36.0)
MCV: 100.1 fl — ABNORMAL HIGH (ref 78.0–100.0)
Monocytes Absolute: 0.5 10*3/uL (ref 0.1–1.0)
Monocytes Relative: 12.4 % — ABNORMAL HIGH (ref 3.0–12.0)
NEUTROS ABS: 2.4 10*3/uL (ref 1.4–7.7)
Neutrophils Relative %: 63 % (ref 43.0–77.0)
Platelets: 222 10*3/uL (ref 150.0–400.0)
RBC: 3.4 Mil/uL — ABNORMAL LOW (ref 3.87–5.11)
RDW: 14.2 % (ref 11.5–15.5)
WBC: 3.9 10*3/uL — AB (ref 4.0–10.5)

## 2015-03-04 NOTE — Telephone Encounter (Signed)
She should ask the trexall question direct to Dr Ronnald Ramp byt calling his office Most recent lab test 02/26/15 - CBC is stable. So no change - continue methotrexate and folic acid  followup  - per recent OV

## 2015-03-07 DIAGNOSIS — R8290 Unspecified abnormal findings in urine: Secondary | ICD-10-CM | POA: Diagnosis not present

## 2015-03-07 DIAGNOSIS — I10 Essential (primary) hypertension: Secondary | ICD-10-CM | POA: Diagnosis not present

## 2015-03-07 DIAGNOSIS — E785 Hyperlipidemia, unspecified: Secondary | ICD-10-CM | POA: Diagnosis not present

## 2015-03-15 DIAGNOSIS — I8312 Varicose veins of left lower extremity with inflammation: Secondary | ICD-10-CM | POA: Diagnosis not present

## 2015-03-15 DIAGNOSIS — I872 Venous insufficiency (chronic) (peripheral): Secondary | ICD-10-CM | POA: Diagnosis not present

## 2015-03-15 DIAGNOSIS — I8311 Varicose veins of right lower extremity with inflammation: Secondary | ICD-10-CM | POA: Diagnosis not present

## 2015-03-19 DIAGNOSIS — Z1212 Encounter for screening for malignant neoplasm of rectum: Secondary | ICD-10-CM | POA: Diagnosis not present

## 2015-03-26 DIAGNOSIS — E785 Hyperlipidemia, unspecified: Secondary | ICD-10-CM | POA: Diagnosis not present

## 2015-03-26 DIAGNOSIS — D869 Sarcoidosis, unspecified: Secondary | ICD-10-CM | POA: Diagnosis not present

## 2015-03-26 DIAGNOSIS — I10 Essential (primary) hypertension: Secondary | ICD-10-CM | POA: Diagnosis not present

## 2015-03-26 DIAGNOSIS — M1990 Unspecified osteoarthritis, unspecified site: Secondary | ICD-10-CM | POA: Diagnosis not present

## 2015-03-26 DIAGNOSIS — Z0000 Encounter for general adult medical examination without abnormal findings: Secondary | ICD-10-CM | POA: Diagnosis not present

## 2015-03-26 DIAGNOSIS — Z6825 Body mass index (BMI) 25.0-25.9, adult: Secondary | ICD-10-CM | POA: Diagnosis not present

## 2015-03-29 ENCOUNTER — Other Ambulatory Visit: Payer: Self-pay | Admitting: Internal Medicine

## 2015-04-09 ENCOUNTER — Encounter: Payer: Self-pay | Admitting: Internal Medicine

## 2015-04-09 ENCOUNTER — Ambulatory Visit (INDEPENDENT_AMBULATORY_CARE_PROVIDER_SITE_OTHER)
Admission: RE | Admit: 2015-04-09 | Discharge: 2015-04-09 | Disposition: A | Discharge status: Home / Self Care | Payer: Medicare Other | Source: Ambulatory Visit | Attending: Internal Medicine | Admitting: Internal Medicine

## 2015-04-09 ENCOUNTER — Other Ambulatory Visit (INDEPENDENT_AMBULATORY_CARE_PROVIDER_SITE_OTHER): Payer: Medicare Other

## 2015-04-09 ENCOUNTER — Ambulatory Visit (INDEPENDENT_AMBULATORY_CARE_PROVIDER_SITE_OTHER): Payer: Medicare Other | Admitting: Internal Medicine

## 2015-04-09 VITALS — BP 164/70 | HR 64 | Ht 62.0 in | Wt 145.0 lb

## 2015-04-09 DIAGNOSIS — D869 Sarcoidosis, unspecified: Secondary | ICD-10-CM | POA: Diagnosis not present

## 2015-04-09 DIAGNOSIS — Z5181 Encounter for therapeutic drug level monitoring: Secondary | ICD-10-CM

## 2015-04-09 DIAGNOSIS — J9819 Other pulmonary collapse: Secondary | ICD-10-CM | POA: Diagnosis not present

## 2015-04-09 LAB — HEPATIC FUNCTION PANEL
ALK PHOS: 57 U/L (ref 39–117)
ALT: 17 U/L (ref 0–35)
AST: 23 U/L (ref 0–37)
Albumin: 4.4 g/dL (ref 3.5–5.2)
BILIRUBIN DIRECT: 0.1 mg/dL (ref 0.0–0.3)
TOTAL PROTEIN: 8 g/dL (ref 6.0–8.3)
Total Bilirubin: 0.4 mg/dL (ref 0.2–1.2)

## 2015-04-09 LAB — CBC
HCT: 37.3 % (ref 36.0–46.0)
Hemoglobin: 12.6 g/dL (ref 12.0–15.0)
MCHC: 33.9 g/dL (ref 30.0–36.0)
MCV: 99.4 fl (ref 78.0–100.0)
Platelets: 240 10*3/uL (ref 150.0–400.0)
RBC: 3.75 Mil/uL — ABNORMAL LOW (ref 3.87–5.11)
RDW: 14.3 % (ref 11.5–15.5)
WBC: 5.2 10*3/uL (ref 4.0–10.5)

## 2015-04-09 LAB — BASIC METABOLIC PANEL
BUN: 19 mg/dL (ref 6–23)
CHLORIDE: 105 meq/L (ref 96–112)
CO2: 26 meq/L (ref 19–32)
Calcium: 9.8 mg/dL (ref 8.4–10.5)
Creatinine, Ser: 0.71 mg/dL (ref 0.40–1.20)
GFR: 83.8 mL/min (ref >60.00)
Glucose, Bld: 108 mg/dL — ABNORMAL HIGH (ref 70–99)
POTASSIUM: 4.8 meq/L (ref 3.5–5.1)
SODIUM: 139 meq/L (ref 135–145)

## 2015-04-09 NOTE — Patient Instructions (Addendum)
#  Sarcoidosis - with Right Middle Lobe Syndrome and cough #CHronic cough - due to above + Irritable larynd #ENcounter for therapeutic monitoring   - disease is stable clinically and cough is improved  - glad you are still off ace inhibitor  PLAN  - Continue prednisone at 5 mg once daily  - Continue methotrexate 5 mg tablet once a week on Mondays or Sundays  - Continue folic acid 1 mg once daily  - Cotninue to hold off on bactrim prophylaxis per our mutual decision from prior visits  -  Do CBC, BMET and LFT today  04/09/2015 - Do CXR 2 view 04/09/2015  - last done nov 2015 - Flu shot in fall  Followup - 4 months   - cough score in 4 months at [facility]

## 2015-04-09 NOTE — Progress Notes (Signed)
Subjective:    Patient ID: [name] Page, female    DOB: [birth year], 81 y.o.   MRN: [id number]  HPI  HPI   OV 10/02/2014  Chief Complaint  Patient presents with  . Follow-up    Pt states her breathing is doing well. Pt states her SOB has improved since last OV. Pt c/o mild cough. Pt denies CP/tightness. Pt stated being on methotrexate has significantly helped.      OV 08/24/2014  Chief Complaint  Patient presents with  . Follow-up    Pt stated the gabapentin has not changed her breathing or cough. Pt c/o prod cough with white mucus. Pt denies SOB and CP/tightness.    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 Main symptom is severe chronic cough with high RSI score suggestive of irritable larynx syndrome that is associated as well  She always finds prednisone to help the cough but due to age and side effects despite good functional status she opts to take a low-dose of prednisone. Currently she is on 5 mg prednisone. In November 2015 due to RSI score of 27 for cough but decided to trial gabapentin to tackle the neurogenic component of cough. She reports now that the gabapentin has not helped her at all although objectively the RSI cough score has drop to 20. She is very frustrated. She and her husband are reluctant to have right middle lobectomy given her advanced age despite good functional status. There are no open to trying methotrexate to tackle the inflammation of sarcoidosis and as a steroid sparing agent. She also wants to come off gabapentin   OV 10/02/2014  Chief Complaint  Patient presents with  . Follow-up    Pt states her breathing is doing well. Pt states her SOB has improved since last OV. Pt c/o mild cough. Pt denies CP/tightness. Pt stated being on methotrexate has significantly helped.     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 Main symptom is severe  chronic cough with high RSI score suggestive of irritable larynx syndrome that is associated as well    Last visit in January 2016 she told me that gabapentin did not help her cough. So we stopped this. We started methotrexate for inflammation in her airway due to  sarcoid causing right middle lobe collapse. We left her on prednisone 5 mg per day and started her on methotrexate 5 mg once a week. Also gave Hycodan cough syrup. She says that the Hycodan cough syrup did not help However, 2 weeks after starting her methotrexate she started noticing dramatic improvement in her cough. She hardly coughs now. She has significant improvement in her quality of life and she is extremely happy.  RSI cough score had dropped from 25 to 20 with gabapentin has now dropped to 5.  She is asking questions about how long to take methotrexate. And also review of the side effect profile. Of note I did notice that she is on ACE inhibitor that can potentially provoke all make her cough worse. She is not taking gabapentin anymore. There is no fever or chills. At this point in time she's not interested in Bactrim PCP prophylaxis which should be okay given low-dose of methotrexate and prednisone  Past, Family, Social reviewed: no change since last visit    OV 01/02/2015  Chief Complaint  Patient presents with  . Follow-up    Pt recently went to opthamologist and pts vision has  decreased since last OV. Pt stated her breathing is unchanged since last OV. Pt c/o mild dry cough. Pt denies CP/tightness.     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 Main symptom is severe chronic cough with high RSI score suggestive of irritable larynx syndrome that is associated as well  Cough /Saarcoid / IRritiable LArynx/ RML syundrome - cough persists but is only mild and baseline. No problems. Does not feel it impacts qualkity of life. She is off ace inhibitor now  ; forgot to mention  this during med reconciliation. No asociated wheeze or dyspnea. Maintained on mehtotrxate 5mg  weekly, folic acid and prednisone 5mg  daily. No evidence of infection. Last LFT was 3 months ago and normal. Hycodan helping cough and wants refill  NEw issue - her eye doc Dr Ellie Lunch called yesterday. PAtient has no complaints of vision but post capsulootmy few week ago and now yesterday visual field deficit on Rt side is worse. Concern is ocular sarcoid is worse. They are referring her to [facility]. PAtient does not want step up Rx of sarcoid till all this is sorted out  New issue - noticed some bruising in left forearm yesterday sponatenous x 2 spots. New. Asytmptomatcic and last 1 weeks some punctate red lesion in distal 1/3 of leg. Associatd with gardening but denies poison ivy. Unchanged since insidious onset. No associatd fever.    OV 04/09/2015  Chief Complaint  Patient presents with  . Follow-up    Pt denies cough, SOB, CP/tightness. Pt states her breathing is doing well. Pt states she had a recent rash on BIL lower extremities and saw derm and was given a topical ointment that helped. Per pt the derm thought the rash was not related to sarcoid.     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 Main symptom is severe chronic cough with high RSI score suggestive of irritable larynx syndrome that is associated as well   Cough /Saarcoid / IRritiable LArynx/ RML syundrome - cough is significantly improved. RSI cough score is only 14. She feels that the cough is at a stage where it is completely fine and she can handle things. She is maintained on methotrexate 5 mg weekly, folic acid and prednisone 5 mg daily. She did have some lowish white count after last visit and anemia but we held to the methotrexate and it seems to be stable. She also had a rash at the time of last visit she has seen dermatology and apparently the biopsies do not show this is due to  sarcoid. I personally review this outside chart and confirmed that. Of note her last chest x-ray was November 2015 which showed continued right middle lobe collapse. The cough is controlled with the methotrexate and prednisone. She is no longer on ACE inhibitor even though our MAR reports that Also noted that she reports some mild weight gain with prednisone but she says she will try dietary measures to shake this weight off. She does not want to cough to come and therefore she is content continue with the methotrexate and prednisone. In terms of her vision she is waiting for [facility] referral to [facility] ophthalmology in October 2016.     Current outpatient prescriptions:  .  amLODipine-benazepril (LOTREL) 10-20 MG per capsule, Take 1 capsule by mouth every morning. , Disp: , Rfl:  .  azelastine (OPTIVAR) 0.05 % ophthalmic solution, Place 1 drop into both eyes every morning., Disp: ,  Rfl:  .  bisacodyl (DULCOLAX) 5 MG EC tablet, Take 5 mg by mouth daily as needed for constipation. For constipation, Disp: , Rfl:  .  Calcium Carbonate-Vitamin D (CALCIUM + D PO), Take 1 tablet by mouth 2 (two) times daily., Disp: , Rfl:  .  Efinaconazole (JUBLIA) 10 % SOLN, Apply topically., Disp: , Rfl:  .  folic acid (FOLVITE) 1 MG tablet, TAKE 1 TABLET (1 MG TOTAL) BY MOUTH DAILY., Disp: 30 tablet, Rfl: 1 .  gemfibrozil (LOPID) 600 MG tablet, Take 600 mg by mouth 2 (two) times daily before a meal., Disp: , Rfl:  .  ibuprofen (ADVIL,MOTRIN) 200 MG tablet, Take 600 mg by mouth daily as needed. For pain, Disp: , Rfl:  .  levothyroxine (SYNTHROID, LEVOTHROID) 25 MCG tablet, Take 1 tablet by mouth daily., Disp: , Rfl:  .  methotrexate (RHEUMATREX) 5 MG tablet, Take 1 tablet (5 mg total) by mouth once a week. On Mondays or Sundays.  Caution: Chemotherapy. Protect from light., Disp: 12 tablet, Rfl: 2 .  Multiple Vitamin (MULTIVITAMIN WITH MINERALS) TABS, Take 1 tablet by mouth daily., Disp: , Rfl:  .  predniSONE (DELTASONE) 5 MG  tablet, Take 1 tablet (5 mg total) by mouth daily with breakfast., Disp: 90 tablet, Rfl: 3 .  HYDROcodone-homatropine (HYCODAN) 5-1.5 MG/5ML syrup, Take 5 mLs by mouth 2 (two) times daily. (Patient not taking: Reported on 04/09/2015), Disp: 300 mL, Rfl: 0    Review of Systems  Constitutional: Negative for fever and unexpected weight change.  HENT: Negative for congestion, dental problem, ear pain, nosebleeds, postnasal drip, rhinorrhea, sinus pressure, sneezing, sore throat and trouble swallowing.   Eyes: Negative for redness and itching.  Respiratory: Negative for cough, chest tightness, shortness of breath and wheezing.   Cardiovascular: Negative for palpitations and leg swelling.  Gastrointestinal: Negative for nausea and vomiting.  Genitourinary: Negative for dysuria.  Musculoskeletal: Negative for joint swelling.  Skin: Negative for rash.  Neurological: Negative for headaches.  Hematological: Does not bruise/bleed easily.  Psychiatric/Behavioral: Negative for dysphoric mood. The patient is not nervous/anxious.        Objective:   Physical Exam  Constitutional: She is oriented to person, place, and time. She appears well-developed and well-nourished. No distress.  HENT:  Head: Normocephalic and atraumatic.  Right Ear: External ear normal.  Left Ear: External ear normal.  Mouth/Throat: Oropharynx is clear and moist. No oropharyngeal exudate.  Eyes: Conjunctivae and EOM are normal. Pupils are equal, round, and reactive to light. Right eye exhibits no discharge. Left eye exhibits no discharge. No scleral icterus.  Neck: Normal range of motion. Neck supple. No JVD present. No tracheal deviation present. No thyromegaly present.  Cardiovascular: Normal rate, regular rhythm, normal heart sounds and intact distal pulses.  Exam reveals no gallop and no friction rub.   No murmur heard. Pulmonary/Chest: Effort normal and breath sounds normal. No respiratory distress. She has no wheezes. She  has no rales. She exhibits no tenderness.  Abdominal: Soft. Bowel sounds are normal. She exhibits no distension and no mass. There is no tenderness. There is no rebound and no guarding.  Musculoskeletal: Normal range of motion. She exhibits no edema or tenderness.  Lymphadenopathy:    She has no cervical adenopathy.  Neurological: She is alert and oriented to person, place, and time. She has normal reflexes. No cranial nerve deficit. She exhibits normal muscle tone. Coordination normal.  Skin: Skin is warm and dry. No rash noted. She is not diaphoretic. No  erythema. No pallor.  Psychiatric: She has a normal mood and affect. Her behavior is normal. Judgment and thought content normal.  Vitals reviewed.   Filed Vitals:   04/09/15 1535  BP: 164/70  Pulse: 64  Height: 5\' 2"  (1.575 m)  Weight: 145 lb (65.772 kg)  SpO2: 98%         Assessment & Plan:     ICD-9-CM ICD-10-CM   1. Sarcoidosis 135 D86.9   2. Encounter for therapeutic drug monitoring V58.83 Z51.81   3. 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     #Sarcoidosis - with Right Middle Lobe Syndrome and cough #CHronic cough - due to above + Irritable larynd #ENcounter for therapeutic monitoring   - disease is stable clinically and cough is improved  - glad you are still off ace inhibitor  PLAN  - Continue prednisone at 5 mg once daily  - Continue methotrexate 5 mg tablet once a week on Mondays or Sundays  - Continue folic acid 1 mg once daily  - Cotninue to hold off on bactrim prophylaxis per our mutual decision from prior visits  -  Do CBC, BMET and LFT today  04/09/2015 - Do CXR 2 view 04/09/2015  - last done nov 2015 - Flu shot in fall  Followup - 4 months   - cough score in [facility] months at [facility]   Dr. Brand Males, M.D., [facility].C.P Pulmonary and Critical Care Medicine Staff Physician [facility] Pager: [phone], If no answer or between  15:00h - 7:00h: call [phone]   [phone]  04/09/2015 4:21 PM

## 2015-04-10 ENCOUNTER — Encounter: Payer: Self-pay | Admitting: Internal Medicine

## 2015-04-10 MED ORDER — FOLIC ACID 1 MG PO TABS: ORAL_TABLET | ORAL | Status: DC

## 2015-04-10 MED ORDER — METHOTREXATE SODIUM 5 MG PO TABS: 5.0000 mg | ORAL_TABLET | ORAL | Status: DC

## 2015-04-12 ENCOUNTER — Telehealth: Payer: Self-pay | Admitting: Internal Medicine

## 2015-04-12 NOTE — Telephone Encounter (Signed)
CXR still with RML collapse - no change since 2015 CBC - normal BMET - normal   Dg Chest 2 View  04/09/2015   CLINICAL DATA:  Followup sarcoidosis and collapse right middle lobe  EXAM: CHEST  2 VIEW  COMPARISON:  Prior studies including 07/10/2014, 09/04/2010, 08/01/2010, and 07/23/2010  FINDINGS: Partial collapse right middle lobe unchanged from 07/10/2014. The degree of collapse is increased when compared to prior studies. Elevation of the right diaphragm is stable. The heart size and vascular pattern are normal. The left lung is clear.  IMPRESSION: Chronic partial collapse right middle lobe. No change from 2015, with similar but slightly more prominent appearance when compared to studies dating back to 2011. Given history of sarcoidosis and soft tissue fullness in the right hilum seen on prior CT, presumably the collapse is related to partial obstruction caused either by adenopathy or fibrosis in the perihilar right lung. Contrast enhanced CT of the thorax could be considered to evaluate the findings more completely.   Electronically Signed   By: Skipper Cliche M.D.   On: 04/09/2015 16:49    Recent Labs Lab 04/09/15 1635  HGB 12.6  HCT 37.3  WBC 5.2  PLT 240.0     Recent Labs Lab 04/09/15 1635  NA 139  K 4.8  CL 105  CO2 26  GLUCOSE 108*  BUN 19  CREATININE 0.71  CALCIUM 9.8

## 2015-04-12 NOTE — Telephone Encounter (Signed)
Called and spoke to pt. Informed her of the results per MR. Pt verbalized understanding and denied any further questions or concerns at this time.  

## 2015-04-13 ENCOUNTER — Other Ambulatory Visit: Payer: Self-pay | Admitting: Internal Medicine

## 2015-04-26 DIAGNOSIS — M79674 Pain in right toe(s): Secondary | ICD-10-CM | POA: Diagnosis not present

## 2015-04-26 DIAGNOSIS — M79675 Pain in left toe(s): Secondary | ICD-10-CM | POA: Diagnosis not present

## 2015-04-26 DIAGNOSIS — B351 Tinea unguium: Secondary | ICD-10-CM | POA: Diagnosis not present

## 2015-04-28 DIAGNOSIS — Z23 Encounter for immunization: Secondary | ICD-10-CM | POA: Diagnosis not present

## 2015-05-17 DIAGNOSIS — H05401 Unspecified enophthalmos, right eye: Secondary | ICD-10-CM | POA: Diagnosis not present

## 2015-05-17 DIAGNOSIS — Z961 Presence of intraocular lens: Secondary | ICD-10-CM | POA: Diagnosis not present

## 2015-05-17 DIAGNOSIS — H57051 Tonic pupil, right eye: Secondary | ICD-10-CM | POA: Diagnosis not present

## 2015-05-17 DIAGNOSIS — D869 Sarcoidosis, unspecified: Secondary | ICD-10-CM | POA: Diagnosis not present

## 2015-05-17 DIAGNOSIS — H469 Unspecified optic neuritis: Secondary | ICD-10-CM | POA: Diagnosis not present

## 2015-05-17 DIAGNOSIS — H468 Other optic neuritis: Secondary | ICD-10-CM | POA: Diagnosis not present

## 2015-05-21 ENCOUNTER — Encounter: Payer: Medicare Other | Admitting: Gynecology

## 2015-05-23 ENCOUNTER — Encounter: Payer: Self-pay | Admitting: Gynecology

## 2015-05-23 ENCOUNTER — Ambulatory Visit (INDEPENDENT_AMBULATORY_CARE_PROVIDER_SITE_OTHER): Payer: Medicare Other | Admitting: Gynecology

## 2015-05-23 VITALS — BP 136/82 | Ht 61.0 in | Wt 144.0 lb

## 2015-05-23 DIAGNOSIS — N952 Postmenopausal atrophic vaginitis: Secondary | ICD-10-CM

## 2015-05-23 DIAGNOSIS — M8580 Other specified disorders of bone density and structure, unspecified site: Secondary | ICD-10-CM | POA: Insufficient documentation

## 2015-05-23 DIAGNOSIS — IMO0002 Reserved for concepts with insufficient information to code with codable children: Secondary | ICD-10-CM

## 2015-05-23 DIAGNOSIS — Z01419 Encounter for gynecological examination (general) (routine) without abnormal findings: Secondary | ICD-10-CM

## 2015-05-23 DIAGNOSIS — N8110 Cystocele, unspecified: Secondary | ICD-10-CM

## 2015-05-23 HISTORY — DX: Reserved for concepts with insufficient information to code with codable children: IMO0002

## 2015-05-23 HISTORY — DX: Other specified disorders of bone density and structure, unspecified site: M85.80

## 2015-05-23 NOTE — Progress Notes (Signed)
[name] Page 1930-12-25 [id number]   History:    81 y.o.  for annual gyn exam with no complaints today. Patient had previously been prescribed Hyalo gyn vaginal hydrating gel for her vaginal dryness and states that she no longer needs it. She is not sexually active. Patient's PCP is Dr. Reynaldo Minium who has been doing her lab work and treating her for hypertension and hypercholesterolemia. Patient has a history of DVT in 2006. She also has had a hemorrhoidectomy. Patient has a history of left hip replacement and right knee replacement and is doing well. She walks and swims on a regular basis. She is taking her calcium and vitamin D. She states that her last bone density study by her rheumatologist was this year and she only has osteopenia. We have no report. She states also her vaccines are up-to-date. Also reports normal colonoscopy in 2011. Patient with no past history of any abnormal Pap smear.  Past medical history,surgical history, family history and social history were all reviewed and documented in the EPIC chart.  Gynecologic History No LMP recorded. Patient is postmenopausal. Contraception: post menopausal status Last Pap: Many years ago. Results were: normal Last mammogram: 2015. Results were: normal  Obstetric History OB History  Gravida Para Term Preterm AB SAB TAB Ectopic Multiple Living  2 2 2       2     # Outcome Date GA Lbr Len/2nd Weight Sex Delivery Anes PTL Lv  2 Term     M Vag-Spont  N Y  1 Term     M Vag-Spont  N Y       ROS: A ROS was performed and pertinent positives and negatives are included in the history.  GENERAL: No fevers or chills. HEENT: No change in vision, no earache, sore throat or sinus congestion. NECK: No pain or stiffness. CARDIOVASCULAR: No chest pain or pressure. No palpitations. PULMONARY: No shortness of breath, cough or wheeze. GASTROINTESTINAL: No abdominal pain, nausea, vomiting or diarrhea, melena or bright red blood per rectum. GENITOURINARY:  No urinary frequency, urgency, hesitancy or dysuria. MUSCULOSKELETAL: No joint or muscle pain, no back pain, no recent trauma. DERMATOLOGIC: No rash, no itching, no lesions. ENDOCRINE: No polyuria, polydipsia, no heat or cold intolerance. No recent change in weight. HEMATOLOGICAL: No anemia or easy bruising or bleeding. NEUROLOGIC: No headache, seizures, numbness, tingling or weakness. PSYCHIATRIC: No depression, no loss of interest in normal activity or change in sleep pattern.     Exam: chaperone present  BP 136/82 mmHg  Ht 5\' 1"  (1.549 m)  Wt 144 lb (65.318 kg)  BMI 27.22 kg/m2  Body mass index is 27.22 kg/(m^2).  General appearance : Well developed well nourished female. No acute distress HEENT: Eyes: no retinal hemorrhage or exudates,  Neck supple, trachea midline, no carotid bruits, no thyroidmegaly Lungs: Clear to auscultation, no rhonchi or wheezes, or rib retractions  Heart: Regular rate and rhythm, no murmurs or gallops Breast:Examined in sitting and supine position were symmetrical in appearance, no palpable masses or tenderness,  no skin retraction, no nipple inversion, no nipple discharge, no skin discoloration, no axillary or supraclavicular lymphadenopathy Abdomen: no palpable masses or tenderness, no rebound or guarding Extremities: no edema or skin discoloration or tenderness  Pelvic:  Bartholin, Urethra, Skene Glands: Within normal limits             Vagina:atrophic changes, first degree cystocele  Cervix: No gross lesions or discharge  Uterus  axial, normal size, shape and consistency, non-tender  and mobile  Adnexa  Without masses or tenderness  Anus and perineum  normal   Rectovaginal  normal sphincter tone without palpated masses or tenderness             Hemoccult PCP provides     Assessment/Plan:  81 y.o. female for annual exam with asymptomatic first-degree cystocele. Patient with vaginal atrophy asymptomatic as well. Pap smear not indicated. PCP has been  doing her blood work. She was reminded to schedule her mammogram for them of this year. We discussed importance of calcium vitamin D and regular exercise for osteoporosis prevention. All vaccines are up-to-date.   Terrance Mass MD, 3:44 PM 05/23/2015

## 2015-05-24 DIAGNOSIS — M79675 Pain in left toe(s): Secondary | ICD-10-CM | POA: Diagnosis not present

## 2015-05-24 DIAGNOSIS — M79674 Pain in right toe(s): Secondary | ICD-10-CM | POA: Diagnosis not present

## 2015-05-24 DIAGNOSIS — B351 Tinea unguium: Secondary | ICD-10-CM | POA: Diagnosis not present

## 2015-05-25 DIAGNOSIS — S4992XA Unspecified injury of left shoulder and upper arm, initial encounter: Secondary | ICD-10-CM | POA: Diagnosis not present

## 2015-06-02 DIAGNOSIS — M25512 Pain in left shoulder: Secondary | ICD-10-CM | POA: Diagnosis not present

## 2015-06-11 DIAGNOSIS — S46012D Strain of muscle(s) and tendon(s) of the rotator cuff of left shoulder, subsequent encounter: Secondary | ICD-10-CM | POA: Diagnosis not present

## 2015-06-15 DIAGNOSIS — Z1231 Encounter for screening mammogram for malignant neoplasm of breast: Secondary | ICD-10-CM | POA: Diagnosis not present

## 2015-06-18 ENCOUNTER — Encounter: Payer: Self-pay | Admitting: Gynecology

## 2015-06-21 DIAGNOSIS — M79675 Pain in left toe(s): Secondary | ICD-10-CM | POA: Diagnosis not present

## 2015-06-21 DIAGNOSIS — M79674 Pain in right toe(s): Secondary | ICD-10-CM | POA: Diagnosis not present

## 2015-06-21 DIAGNOSIS — B351 Tinea unguium: Secondary | ICD-10-CM | POA: Diagnosis not present

## 2015-07-16 ENCOUNTER — Ambulatory Visit (INDEPENDENT_AMBULATORY_CARE_PROVIDER_SITE_OTHER): Payer: Medicare Other | Admitting: Internal Medicine

## 2015-07-16 ENCOUNTER — Encounter: Payer: Self-pay | Admitting: Internal Medicine

## 2015-07-16 VITALS — BP 134/60 | HR 73 | Ht 61.0 in | Wt 146.4 lb

## 2015-07-16 DIAGNOSIS — J9819 Other pulmonary collapse: Secondary | ICD-10-CM

## 2015-07-16 DIAGNOSIS — D869 Sarcoidosis, unspecified: Secondary | ICD-10-CM

## 2015-07-16 DIAGNOSIS — R053 Chronic cough: Secondary | ICD-10-CM

## 2015-07-16 DIAGNOSIS — R05 Cough: Secondary | ICD-10-CM

## 2015-07-16 MED ORDER — HYDROCODONE-HOMATROPINE 5-1.5 MG/5ML PO SYRP: 5.0000 mL | ORAL_SOLUTION | Freq: Two times a day (BID) | ORAL | Status: DC | PRN

## 2015-07-16 MED ORDER — FOLIC ACID 1 MG PO TABS: ORAL_TABLET | ORAL | Status: DC

## 2015-07-16 MED ORDER — HYDROCODONE-HOMATROPINE 5-1.5 MG/5ML PO SYRP: 5.0000 mL | ORAL_SOLUTION | Freq: Four times a day (QID) | ORAL | Status: DC | PRN

## 2015-07-16 NOTE — Patient Instructions (Addendum)
#  Sarcoidosis - with Right Middle Lobe Syndrome and cough #CHronic cough - due to above + Irritable larynd #ENcounter for therapeutic monitoring   - disease is stable clinically baed on CXR Aug 2016 but unclear why  cough is worse - cough could be worse due to post nasal drip  PLAN - START  SALINE NASAL SPRAY -  START generic fluticasone inhaler 2 squirts each nostril daily  - tAKE SAMPLE   - Continue prednisone at 5 mg once daily  - Continue methotrexate 5 mg tablet once a week on Mondays or Sundays  - Continue folic acid 1 mg once daily   - DO 90 DAY REFILLS - COntinue cough syrup as needed  - DO REFILL with 3 post dated scirpts - Cotninue to hold off on bactrim prophylaxis per our mutual decision from prior visits  -  Do CBC, BMET and LFT in 3 months - Followup -3 months   - cough score in 4 months at [facility]  - If cough unimproved will consider CT chest and/or increasing prednisone/methotrexate versus gabapentin therapy

## 2015-07-16 NOTE — Progress Notes (Signed)
Subjective:    Patient ID: [name] Page, female    DOB: 1930/10/18, 81 y.o.   MRN: [id number]  HPI   HPI   OV 10/02/2014  Chief Complaint  Patient presents with  . Follow-up    Pt states her breathing is doing well. Pt states her SOB has improved since last OV. Pt c/o mild cough. Pt denies CP/tightness. Pt stated being on methotrexate has significantly helped.      OV 08/24/2014  Chief Complaint  Patient presents with  . Follow-up    Pt stated the gabapentin has not changed her breathing or cough. Pt c/o prod cough with white mucus. Pt denies SOB and CP/tightness.    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 Main symptom is severe chronic cough with high RSI score suggestive of irritable larynx syndrome that is associated as well  She always finds prednisone to help the cough but due to age and side effects despite good functional status she opts to take a low-dose of prednisone. Currently she is on 5 mg prednisone. In November 2015 due to RSI score of 27 for cough but decided to trial gabapentin to tackle the neurogenic component of cough. She reports now that the gabapentin has not helped her at all although objectively the RSI cough score has drop to 20. She is very frustrated. She and her husband are reluctant to have right middle lobectomy given her advanced age despite good functional status. There are no open to trying methotrexate to tackle the inflammation of sarcoidosis and as a steroid sparing agent. She also wants to come off gabapentin   OV 10/02/2014  Chief Complaint  Patient presents with  . Follow-up    Pt states her breathing is doing well. Pt states her SOB has improved since last OV. Pt c/o mild cough. Pt denies CP/tightness. Pt stated being on methotrexate has significantly helped.     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 Main symptom is severe  chronic cough with high RSI score suggestive of irritable larynx syndrome that is associated as well    Last visit in January 2016 she told me that gabapentin did not help her cough. So we stopped this. We started methotrexate for inflammation in her airway due to  sarcoid causing right middle lobe collapse. We left her on prednisone 5 mg per day and started her on methotrexate 5 mg once a week. Also gave Hycodan cough syrup. She says that the Hycodan cough syrup did not help However, 2 weeks after starting her methotrexate she started noticing dramatic improvement in her cough. She hardly coughs now. She has significant improvement in her quality of life and she is extremely happy.  RSI cough score had dropped from 25 to 20 with gabapentin has now dropped to 5.  She is asking questions about how long to take methotrexate. And also review of the side effect profile. Of note I did notice that she is on ACE inhibitor that can potentially provoke all make her cough worse. She is not taking gabapentin anymore. There is no fever or chills. At this point in time she's not interested in Bactrim PCP prophylaxis which should be okay given low-dose of methotrexate and prednisone  Past, Family, Social reviewed: no change since last visit    OV 01/02/2015  Chief Complaint  Patient presents with  . Follow-up    Pt recently went to opthamologist and pts vision  has decreased since last OV. Pt stated her breathing is unchanged since last OV. Pt c/o mild dry cough. Pt denies CP/tightness.     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 Main symptom is severe chronic cough with high RSI score suggestive of irritable larynx syndrome that is associated as well  Cough /Saarcoid / IRritiable LArynx/ RML syundrome - cough persists but is only mild and baseline. No problems. Does not feel it impacts qualkity of life. She is off ace inhibitor now  ; forgot to mention  this during med reconciliation. No asociated wheeze or dyspnea. Maintained on mehtotrxate 5mg  weekly, folic acid and prednisone 5mg  daily. No evidence of infection. Last LFT was 3 months ago and normal. Hycodan helping cough and wants refill  NEw issue - her eye doc Dr Ellie Lunch called yesterday. PAtient has no complaints of vision but post capsulootmy few week ago and now yesterday visual field deficit on Rt side is worse. Concern is ocular sarcoid is worse. They are referring her to [facility]. PAtient does not want step up Rx of sarcoid till all this is sorted out  New issue - noticed some bruising in left forearm yesterday sponatenous x 2 spots. New. Asytmptomatcic and last 1 weeks some punctate red lesion in distal 1/3 of leg. Associatd with gardening but denies poison ivy. Unchanged since insidious onset. No associatd fever.    OV 04/09/2015  Chief Complaint  Patient presents with  . Follow-up    Pt denies cough, SOB, CP/tightness. Pt states her breathing is doing well. Pt states she had a recent rash on BIL lower extremities and saw derm and was given a topical ointment that helped. Per pt the derm thought the rash was not related to sarcoid.     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 Main symptom is severe chronic cough with high RSI score suggestive of irritable larynx syndrome that is associated as well   Cough /Saarcoid / IRritiable LArynx/ RML syundrome - cough is significantly improved. RSI cough score is only 14. She feels that the cough is at a stage where it is completely fine and she can handle things. She is maintained on methotrexate 5 mg weekly, folic acid and prednisone 5 mg daily. She did have some lowish white count after last visit and anemia but we held to the methotrexate and it seems to be stable. She also had a rash at the time of last visit she has seen dermatology and apparently the biopsies do not show this is due to  sarcoid. I personally review this outside chart and confirmed that. Of note her last chest x-ray was November 2015 which showed continued right middle lobe collapse. The cough is controlled with the methotrexate and prednisone. She is no longer on ACE inhibitor even though our MAR reports that Also noted that she reports some mild weight gain with prednisone but she says she will try dietary measures to shake this weight off. She does not want to cough to come and therefore she is content continue with the methotrexate and prednisone. In terms of her vision she is waiting [facility]mology in October 2016.    OV 07/16/2015  Chief Complaint  Patient presents with  . Follow-up    Pt states her breathing is unchanged but her cough has worsened. Pt states her SOB is at her baseline. Pt states her cough is nonproductive and worse  in the evening. Pt denies CP/tightness.     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 Main symptom is severe chronic cough with high RSI score suggestive of irritable larynx syndrome that is associated as well   Cough /Saarcoid / IRritiable LArynx/ RML syundrome -she now reports that her cough is worse for the last few months especially since last visit. RSI cough score is 18 and subjectively she feels that cough is worse by 25%. Otherwise she is okay. Cough is associated with worsening postnasal drip. She is also asking for refills on a cough syrup methotrexate folic acid. She prefers to have 90 day refills. Despite cough worsened she is not too keen on gabapentin therapy and wants to try other simpler measures at this point in time. There is no fever or sputum production that is associated with the cough.  In terms of her ophthalmology issues she has been reassured apparently it is a left optic now that is problematic in this 50% gone. No active follow-up.   Dr Lorenza [place] Reflux Symptom Index (>  13-15 suggestive of LPR cough) 0 -> 5  =  none ->severe problem  Hoarseness of problem with voice 2  Clearing  Of Throat 3  Excess throat mucus or feeling of post nasal drip 1  Difficulty swallowing food, liquid or tablets 2  Cough after eating or lying down 3  Breathing difficulties or choking episodes 1  Troublesome or annoying cough 3  Sensation of something sticking in throat or lump in throat 0  Heartburn, chest pain, indigestion, or stomach acid coming up 3  TOTAL 18    Immunization History  Administered Date(s) Administered  . Influenza Split 04/11/2013, 04/11/2014  . Influenza Whole 04/15/2010  . Influenza,inj,Quad PF,36+ Mos 05/07/2015  . Pneumococcal Polysaccharide-23 05/13/2004  . Pneumococcal-Unspecified 08/11/2010, 03/11/2014  . Td 02/08/2002  . Zoster 06/12/2007      Current outpatient prescriptions:  .  amLODipine-benazepril (LOTREL) 5-10 MG capsule, Take 1 capsule by mouth daily., Disp: , Rfl:  .  azelastine (OPTIVAR) 0.05 % ophthalmic solution, Place 1 drop into both eyes every morning., Disp: , Rfl:  .  bisacodyl (DULCOLAX) 5 MG EC tablet, Take 5 mg by mouth daily as needed for constipation. For constipation, Disp: , Rfl:  .  Cholecalciferol (VITAMIN D-1000 MAX ST) 1000 UNITS tablet, Take 1 tablet by mouth daily., Disp: , Rfl:  .  folic acid (FOLVITE) 1 MG tablet, TAKE 1 TABLET (1 MG TOTAL) BY MOUTH DAILY., Disp: 30 tablet, Rfl: 4 .  gemfibrozil (LOPID) 600 MG tablet, Take 600 mg by mouth 2 (two) times daily before a meal., Disp: , Rfl:  .  ibuprofen (ADVIL,MOTRIN) 200 MG tablet, Take 600 mg by mouth daily as needed. For pain, Disp: , Rfl:  .  levothyroxine (SYNTHROID, LEVOTHROID) 25 MCG tablet, Take 1 tablet by mouth daily., Disp: , Rfl:  .  LORazepam (ATIVAN) 0.5 MG tablet, as needed., Disp: , Rfl:  .  losartan (COZAAR) 50 MG tablet, Take 1 tablet by mouth daily., Disp: , Rfl:  .  methotrexate (RHEUMATREX) 5 MG tablet, Take 1 tablet (5 mg total) by mouth once  a week. On Mondays or Sundays.  Caution: Chemotherapy. Protect from light., Disp: 12 tablet, Rfl: 4 .  omeprazole (PRILOSEC) 20 MG capsule, Take 1 capsule by mouth daily as needed. , Disp: , Rfl: 7 .  predniSONE (DELTASONE) 5 MG tablet, TAKE 1 TABLET BY MOUTH EVERY DAY WITH BREAKFAST, Disp:  90 tablet, Rfl: 3 .  terbinafine (LAMISIL) 250 MG tablet, Take 1 tablet by mouth daily., Disp: , Rfl:  .  Ascorbic Acid (VITAMIN C) 1000 MG tablet, Take 1,000 mg by mouth daily., Disp: , Rfl:  .  aspirin 325 MG tablet, Take 1 tablet by mouth daily., Disp: , Rfl:  .  Multiple Vitamin (MULTIVITAMIN WITH MINERALS) TABS, Take 1 tablet by mouth daily., Disp: , Rfl:    Review of Systems  According to history of present illness     Objective:   Physical Exam  Constitutional: She is oriented to person, place, and time. She appears well-developed and well-nourished. No distress.  HENT:  Head: Normocephalic and atraumatic.  Right Ear: External ear normal.  Left Ear: External ear normal.  Mouth/Throat: Oropharynx is clear and moist. No oropharyngeal exudate.  Eyes: Conjunctivae and EOM are normal. Pupils are equal, round, and reactive to light. Right eye exhibits no discharge. Left eye exhibits no discharge. No scleral icterus.  Neck: Normal range of motion. Neck supple. No JVD present. No tracheal deviation present. No thyromegaly present.  Cardiovascular: Normal rate, regular rhythm, normal heart sounds and intact distal pulses.  Exam reveals no gallop and no friction rub.   No murmur heard. Pulmonary/Chest: Effort normal and breath sounds normal. No respiratory distress. She has no wheezes. She has no rales. She exhibits no tenderness.  Abdominal: Soft. Bowel sounds are normal. She exhibits no distension and no mass. There is no tenderness. There is no rebound and no guarding.  Musculoskeletal: Normal range of motion. She exhibits no edema or tenderness.  Lymphadenopathy:    She has no cervical adenopathy.    Neurological: She is alert and oriented to person, place, and time. She has normal reflexes. No cranial nerve deficit. She exhibits normal muscle tone. Coordination normal.  Skin: Skin is warm and dry. No rash noted. She is not diaphoretic. No erythema. No pallor.  Psychiatric: She has a normal mood and affect. Her behavior is normal. Judgment and thought content normal.  Vitals reviewed.   Filed Vitals:   07/16/15 1528  BP: 134/60  Pulse: 73  Height: 5\' 1"  (1.549 m)  Weight: 146 lb 6.4 oz (66.407 kg)  SpO2: 97%          Assessment & Plan:     ICD-9-CM ICD-10-CM   1. Sarcoidosis ([place]) 135 D86.9   2. 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 Chronic cough 786.2 R05    #Sarcoidosis - with Right Middle Lobe Syndrome and cough #CHronic cough - due to above + Irritable larynd #ENcounter for therapeutic monitoring   - disease is stable clinically baed on CXR Aug 2016 but unclear why  cough is worse - cough could be worse due to post nasal drip  PLAN - START  SALINE NASAL SPRAY -  START generic fluticasone inhaler 2 squirts each nostril daily  - tAKE SAMPLE   - Continue prednisone at 5 mg once daily  - Continue methotrexate 5 mg tablet once a week on Mondays or Sundays  - Continue folic acid 1 mg once daily   - DO 90 DAY REFILLS - COntinue cough syrup as needed  - DO REFILL with 3 post dated scirpts - Cotninue to hold off on bactrim prophylaxis per our mutual decision from prior visits  -  Do CBC, BMET and LFT in 3 months - Followup -3 months   - cough score in 4 months at [facility] - If cough unimproved will  consider increasing methotrexate/prednisone or try gabapentin  (gabapentin did not work in the past] Dr. Brand Males, M.D., [facility].C.P Pulmonary and Critical Care Medicine Staff Physician [facility] Pager: [phone], If no answer or between  15:00h - 7:00h: call [phone]  07/23/2015 2:03 AM

## 2015-07-19 DIAGNOSIS — M79674 Pain in right toe(s): Secondary | ICD-10-CM | POA: Diagnosis not present

## 2015-07-19 DIAGNOSIS — B351 Tinea unguium: Secondary | ICD-10-CM | POA: Diagnosis not present

## 2015-07-19 DIAGNOSIS — M79675 Pain in left toe(s): Secondary | ICD-10-CM | POA: Diagnosis not present

## 2015-09-24 DIAGNOSIS — J984 Other disorders of lung: Secondary | ICD-10-CM | POA: Diagnosis not present

## 2015-09-24 DIAGNOSIS — I10 Essential (primary) hypertension: Secondary | ICD-10-CM | POA: Diagnosis not present

## 2015-09-24 DIAGNOSIS — E784 Other hyperlipidemia: Secondary | ICD-10-CM | POA: Diagnosis not present

## 2015-09-24 DIAGNOSIS — D869 Sarcoidosis, unspecified: Secondary | ICD-10-CM | POA: Diagnosis not present

## 2015-09-24 DIAGNOSIS — M1990 Unspecified osteoarthritis, unspecified site: Secondary | ICD-10-CM | POA: Diagnosis not present

## 2015-09-24 DIAGNOSIS — D692 Other nonthrombocytopenic purpura: Secondary | ICD-10-CM | POA: Insufficient documentation

## 2015-09-24 DIAGNOSIS — Z6825 Body mass index (BMI) 25.0-25.9, adult: Secondary | ICD-10-CM | POA: Diagnosis not present

## 2015-09-24 DIAGNOSIS — Z1389 Encounter for screening for other disorder: Secondary | ICD-10-CM | POA: Diagnosis not present

## 2015-10-11 DIAGNOSIS — M79675 Pain in left toe(s): Secondary | ICD-10-CM | POA: Diagnosis not present

## 2015-10-11 DIAGNOSIS — M79674 Pain in right toe(s): Secondary | ICD-10-CM | POA: Diagnosis not present

## 2015-10-18 ENCOUNTER — Encounter: Payer: Self-pay | Admitting: Internal Medicine

## 2015-10-18 ENCOUNTER — Other Ambulatory Visit (INDEPENDENT_AMBULATORY_CARE_PROVIDER_SITE_OTHER): Payer: Medicare Other

## 2015-10-18 ENCOUNTER — Ambulatory Visit (INDEPENDENT_AMBULATORY_CARE_PROVIDER_SITE_OTHER): Payer: Medicare Other | Admitting: Internal Medicine

## 2015-10-18 VITALS — BP 134/66 | HR 69 | Ht 61.0 in | Wt 144.0 lb

## 2015-10-18 DIAGNOSIS — Z5181 Encounter for therapeutic drug level monitoring: Secondary | ICD-10-CM | POA: Diagnosis not present

## 2015-10-18 DIAGNOSIS — R05 Cough: Secondary | ICD-10-CM

## 2015-10-18 DIAGNOSIS — D869 Sarcoidosis, unspecified: Secondary | ICD-10-CM | POA: Diagnosis not present

## 2015-10-18 DIAGNOSIS — D72819 Decreased white blood cell count, unspecified: Secondary | ICD-10-CM

## 2015-10-18 DIAGNOSIS — J9819 Other pulmonary collapse: Secondary | ICD-10-CM

## 2015-10-18 DIAGNOSIS — R053 Chronic cough: Secondary | ICD-10-CM

## 2015-10-18 LAB — CBC
HEMATOCRIT: 36.6 % (ref 36.0–46.0)
HEMOGLOBIN: 12.5 g/dL (ref 12.0–15.0)
MCHC: 34.1 g/dL (ref 30.0–36.0)
MCV: 99.9 fl (ref 78.0–100.0)
Platelets: 251 10*3/uL (ref 150.0–400.0)
RBC: 3.66 Mil/uL — ABNORMAL LOW (ref 3.87–5.11)
RDW: 14.5 % (ref 11.5–15.5)
WBC: 3.5 10*3/uL — ABNORMAL LOW (ref 4.0–10.5)

## 2015-10-18 LAB — BASIC METABOLIC PANEL
BUN: 13 mg/dL (ref 6–23)
CALCIUM: 9.5 mg/dL (ref 8.4–10.5)
CO2: 27 meq/L (ref 19–32)
CREATININE: 0.74 mg/dL (ref 0.40–1.20)
Chloride: 105 mEq/L (ref 96–112)
GFR: 79.79 mL/min (ref >60.00)
Glucose, Bld: 86 mg/dL (ref 70–99)
Potassium: 4.2 mEq/L (ref 3.5–5.1)
Sodium: 141 mEq/L (ref 135–145)

## 2015-10-18 LAB — HEPATIC FUNCTION PANEL
ALT: 11 U/L (ref 0–35)
AST: 16 U/L (ref 0–37)
Albumin: 4.2 g/dL (ref 3.5–5.2)
Alkaline Phosphatase: 53 U/L (ref 39–117)
BILIRUBIN DIRECT: 0.1 mg/dL (ref 0.0–0.3)
TOTAL PROTEIN: 7.6 g/dL (ref 6.0–8.3)
Total Bilirubin: 0.4 mg/dL (ref 0.2–1.2)

## 2015-10-18 MED ORDER — HYDROCODONE-HOMATROPINE 5-1.5 MG/5ML PO SYRP: 5.0000 mL | ORAL_SOLUTION | Freq: Two times a day (BID) | ORAL | Status: DC | PRN

## 2015-10-18 MED ORDER — METHOTREXATE SODIUM 5 MG PO TABS: 5.0000 mg | ORAL_TABLET | ORAL | Status: DC

## 2015-10-18 MED ORDER — FLUTICASONE PROPIONATE 50 MCG/ACT NA SUSP: 2.0000 | Freq: Every day | NASAL | Status: DC

## 2015-10-18 NOTE — Patient Instructions (Addendum)
#  Sarcoidosis - with Right Middle Lobe Syndrome and cough #CHronic cough - due to above + Irritable larynd #ENcounter for therapeutic monitoring   - disease is stable clinically baed on history 10/18/2015 and CXR Aug 2016  - cough is improved with treatment of   PLAN - Continue  SALINE NASAL SPRAY -  Continue generic fluticasone inhaler 2 squirts each nostril daily  - tAKE PRESCRIPTION   - Continue prednisone at 5 mg once daily  - Continue methotrexate 5 mg tablet once a week on Mondays or Sundays  - we will do 90d refill  - Continue folic acid 1 mg once daily  - COntinue cough syrup as needed  - DO REFILL for 30 days but will post date it 3 months from 10/18/2015' - Cotninue to hold off on bactrim prophylaxis per our mutual decision from prior visits  -  Do CBC, BMET and LFT 10/18/2015  - Followup 6 months   - cough score in 6 months at [facility]  - If cough deteriorates will consider CT chest and/or increasing prednisone/methotrexate versus gabapentin therapy

## 2015-10-18 NOTE — Progress Notes (Signed)
Subjective:     Patient ID: Ernesto Rutherford, female   DOB: [birth year], 82 y.o.   MRN: [id number]  HPI  OV 08/24/2014  Chief Complaint  Patient presents with  . Follow-up    Pt stated the gabapentin has not changed her breathing or cough. Pt c/o prod cough with white mucus. Pt denies SOB and CP/tightness.    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 Main symptom is severe chronic cough with high RSI score suggestive of irritable larynx syndrome that is associated as well  She always finds prednisone to help the cough but due to age and side effects despite good functional status she opts to take a low-dose of prednisone. Currently she is on 5 mg prednisone. In November 2015 due to RSI score of 27 for cough but decided to trial gabapentin to tackle the neurogenic component of cough. She reports now that the gabapentin has not helped her at all although objectively the RSI cough score has drop to 20. She is very frustrated. She and her husband are reluctant to have right middle lobectomy given her advanced age despite good functional status. There are no open to trying methotrexate to tackle the inflammation of sarcoidosis and as a steroid sparing agent. She also wants to come off gabapentin   OV 10/02/2014  Chief Complaint  Patient presents with  . Follow-up    Pt states her breathing is doing well. Pt states her SOB has improved since last OV. Pt c/o mild cough. Pt denies CP/tightness. Pt stated being on methotrexate has significantly helped.     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 Main symptom is severe chronic cough with high RSI score suggestive of irritable larynx syndrome that is associated as well    Last visit in January 2016 she told me that gabapentin did not help her cough. So we stopped this. We started methotrexate for inflammation in her airway due to  sarcoid  causing right middle lobe collapse. We left her on prednisone 5 mg per day and started her on methotrexate 5 mg once a week. Also gave Hycodan cough syrup. She says that the Hycodan cough syrup did not help However, 2 weeks after starting her methotrexate she started noticing dramatic improvement in her cough. She hardly coughs now. She has significant improvement in her quality of life and she is extremely happy.  RSI cough score had dropped from 25 to 20 with gabapentin has now dropped to 5.  She is asking questions about how long to take methotrexate. And also review of the side effect profile. Of note I did notice that she is on ACE inhibitor that can potentially provoke all make her cough worse. She is not taking gabapentin anymore. There is no fever or chills. At this point in time she's not interested in Bactrim PCP prophylaxis which should be okay given low-dose of methotrexate and prednisone  Past, Family, Social reviewed: no change since last visit    OV 01/02/2015  Chief Complaint  Patient presents with  . Follow-up    Pt recently went to opthamologist and pts vision has decreased since last OV. Pt stated her breathing is unchanged since last OV. Pt c/o mild dry cough. Pt denies CP/tightness.     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 Main symptom is severe chronic cough with high RSI score suggestive of irritable larynx syndrome that is  associated as well  Cough /Saarcoid / IRritiable LArynx/ RML syundrome - cough persists but is only mild and baseline. No problems. Does not feel it impacts qualkity of life. She is off ace inhibitor now  ; forgot to mention this during med reconciliation. No asociated wheeze or dyspnea. Maintained on mehtotrxate 5mg  weekly, folic acid and prednisone 5mg  daily. No evidence of infection. Last LFT was 3 months ago and normal. Hycodan helping cough and wants refill  NEw issue - her eye doc Dr Ellie Lunch  called yesterday. PAtient has no complaints of vision but post capsulootmy few week ago and now yesterday visual field deficit on Rt side is worse. Concern is ocular sarcoid is worse. They are referring her to [place]. PAtient does not want step up Rx of sarcoid till all this is sorted out  New issue - noticed some bruising in left forearm yesterday sponatenous x 2 spots. New. Asytmptomatcic and last 1 weeks some punctate red lesion in distal 1/3 of leg. Associatd with gardening but denies poison ivy. Unchanged since insidious onset. No associatd fever.    OV 04/09/2015  Chief Complaint  Patient presents with  . Follow-up    Pt denies cough, SOB, CP/tightness. Pt states her breathing is doing well. Pt states she had a recent rash on BIL lower extremities and saw derm and was given a topical ointment that helped. Per pt the derm thought the rash was not related to sarcoid.     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 Main symptom is severe chronic cough with high RSI score suggestive of irritable larynx syndrome that is associated as well   Cough /Saarcoid / IRritiable LArynx/ RML syundrome - cough is significantly improved. RSI cough score is only 14. She feels that the cough is at a stage where it is completely fine and she can handle things. She is maintained on methotrexate 5 mg weekly, folic acid and prednisone 5 mg daily. She did have some lowish white count after last visit and anemia but we held to the methotrexate and it seems to be stable. She also had a rash at the time of last visit she has seen dermatology and apparently the biopsies do not show this is due to sarcoid. I personally review this outside chart and confirmed that. Of note her last chest x-ray was November 2015 which showed continued right middle lobe collapse. The cough is controlled with the methotrexate and prednisone. She is no longer on ACE inhibitor even though our MAR  reports that Also noted that she reports some mild weight gain with prednisone but she says she will try dietary measures to shake this weight off. She does not want to cough to come and therefore she is content continue with the methotrexate and prednisone. In terms of her vision she is [facility] in October 2016.    OV 07/16/2015  Chief Complaint  Patient presents with  . Follow-up    Pt states her breathing is unchanged but her cough has worsened. Pt states her SOB is at her baseline. Pt states her cough is nonproductive and worse in the evening. Pt denies CP/tightness.     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 Main symptom is severe chronic cough with high RSI score suggestive of irritable larynx syndrome that is associated as well   Cough /Saarcoid / IRritiable LArynx/ RML syundrome -she now reports that  her cough is worse for the last few months especially since last visit. RSI cough score is 18 and subjectively she feels that cough is worse by 25%. Otherwise she is okay. Cough is associated with worsening postnasal drip. She is also asking for refills on a cough syrup methotrexate folic acid. She prefers to have 90 day refills. Despite cough worsened she is not too keen on gabapentin therapy and wants to try other simpler measures at this point in time. There is no fever or sputum production that is associated with the cough.  In terms of her ophthalmology issues she has been reassured apparently it is a left optic now that is problematic in this 50% gone. No active follow-up.   OV 10/18/2015   Chief Complaint  Patient presents with  . Follow-up    Pt states her cough has significantly improved. Pt denies SOB and CP/tightness. Pt states overall she feels she is doing well.     Follow-up chronic cough associated with right middle lobe syndrome secondary to sarcoidosis and irritable larynx  syndrome  Last visit December 2016. At the time cough that deteriorated. The significant amount of postnasal drip. We instituted nasal steroids and with this cough is significantly improved and back to baseline. RSI cough score is 10 which is around her baseline. She feels immensely better. She is in a good phase of her health. There are no new issues. She is pending $24 and over-the-counter nasal steroid and we discussed about doing a generic prescription which might be cheaper. She wants refill postdated on her cough syrup. She wants 90 day refills on her methotrexate. She is happy with having a next follow-up in 6 months.  Dr Lorenza Cambridge Reflux Symptom Index (> 13-15 suggestive of LPR cough) Dec 2016 10/18/2015   Hoarseness of problem with voice 2 3  Clearing  Of Throat 3 2  Excess throat mucus or feeling of post nasal drip 1 1  Difficulty swallowing food, liquid or tablets 2 1  Cough after eating or lying down 3 0  Breathing difficulties or choking episodes 1 0  Troublesome or annoying cough 3 1  Sensation of something sticking in throat or lump in throat 0 1  Heartburn, chest pain, indigestion, or stomach acid coming up 3 1  TOTAL 18 10   Immunization History  Administered Date(s) Administered  . Influenza Split 04/11/2013, 04/11/2014  . Influenza Whole 04/15/2010  . Influenza,inj,Quad PF,36+ Mos 05/07/2015  . Pneumococcal Polysaccharide-23 05/13/2004  . Pneumococcal-Unspecified 08/11/2010, 03/11/2014  . Td 02/08/2002  . Zoster 06/12/2007     Current outpatient prescriptions:  .  amLODipine-benazepril (LOTREL) 5-10 MG capsule, Take 1 capsule by mouth daily., Disp: , Rfl:  .  Ascorbic Acid (VITAMIN C) 1000 MG tablet, Take 1,000 mg by mouth daily., Disp: , Rfl:  .  aspirin 325 MG tablet, Take 1 tablet by mouth daily as needed. , Disp: , Rfl:  .  azelastine (OPTIVAR) 0.05 % ophthalmic solution, Place 1 drop into both eyes every morning., Disp: , Rfl:  .  bisacodyl (DULCOLAX) 5 MG EC  tablet, Take 5 mg by mouth daily as needed for constipation. For constipation, Disp: , Rfl:  .  Cholecalciferol (VITAMIN D-1000 MAX ST) 1000 UNITS tablet, Take 1 tablet by mouth daily., Disp: , Rfl:  .  fluticasone (FLONASE) 50 MCG/ACT nasal spray, Place 2 sprays into both nostrils daily., Disp: , Rfl:  .  folic acid (FOLVITE) 1 MG tablet, TAKE 1 TABLET (1 MG TOTAL) BY  MOUTH DAILY., Disp: 90 tablet, Rfl: 3 .  gemfibrozil (LOPID) 600 MG tablet, Take 600 mg by mouth 2 (two) times daily before a meal., Disp: , Rfl:  .  HYDROcodone-homatropine (HYCODAN) 5-1.5 MG/5ML syrup, Take 5 mLs by mouth 2 (two) times daily as needed for cough., Disp: 300 mL, Rfl: 0 .  ibuprofen (ADVIL,MOTRIN) 200 MG tablet, Take 600 mg by mouth daily as needed. For pain, Disp: , Rfl:  .  levothyroxine (SYNTHROID, LEVOTHROID) 25 MCG tablet, Take 1 tablet by mouth daily., Disp: , Rfl:  .  LORazepam (ATIVAN) 0.5 MG tablet, as needed., Disp: , Rfl:  .  losartan (COZAAR) 50 MG tablet, Take 1 tablet by mouth daily., Disp: , Rfl:  .  methotrexate (RHEUMATREX) 5 MG tablet, Take 1 tablet (5 mg total) by mouth once a week. On Mondays or Sundays.  Caution: Chemotherapy. Protect from light., Disp: 12 tablet, Rfl: 4 .  omeprazole (PRILOSEC) 20 MG capsule, Take 1 capsule by mouth daily as needed. , Disp: , Rfl: 7 .  predniSONE (DELTASONE) 5 MG tablet, TAKE 1 TABLET BY MOUTH EVERY DAY WITH BREAKFAST, Disp: 90 tablet, Rfl: 3 .  HYDROcodone-homatropine (HYCODAN) 5-1.5 MG/5ML syrup, Take 5 mLs by mouth every 6 (six) hours as needed for cough. (Patient not taking: Reported on 10/18/2015), Disp: 300 mL, Rfl: 0   Review of Systems Per hpi    Objective:   Physical Exam  Constitutional: She is oriented to person, place, and time. She appears well-developed and well-nourished. No distress.  HENT:  Head: Normocephalic and atraumatic.  Right Ear: External ear normal.  Left Ear: External ear normal.  Mouth/Throat: Oropharynx is clear and moist. No  oropharyngeal exudate.  Eyes: Conjunctivae and EOM are normal. Pupils are equal, round, and reactive to light. Right eye exhibits no discharge. Left eye exhibits no discharge. No scleral icterus.  Neck: Normal range of motion. Neck supple. No JVD present. No tracheal deviation present. No thyromegaly present.  Cardiovascular: Normal rate, regular rhythm, normal heart sounds and intact distal pulses.  Exam reveals no gallop and no friction rub.   No murmur heard. Pulmonary/Chest: Effort normal and breath sounds normal. No respiratory distress. She has no wheezes. She has no rales. She exhibits no tenderness.  Slightly diminished air entry in the right middle lobe area  Abdominal: Soft. Bowel sounds are normal. She exhibits no distension and no mass. There is no tenderness. There is no rebound and no guarding.  Musculoskeletal: Normal range of motion. She exhibits no edema or tenderness.  Lymphadenopathy:    She has no cervical adenopathy.  Neurological: She is alert and oriented to person, place, and time. She has normal reflexes. No cranial nerve deficit. She exhibits normal muscle tone. Coordination normal.  Skin: Skin is warm and dry. No rash noted. She is not diaphoretic. No erythema. No pallor.  Psychiatric: She has a normal mood and affect. Her behavior is normal. Judgment and thought content normal.  Vitals reviewed.   Filed Vitals:   10/18/15 0856  BP: 134/66  Pulse: 69  Height: 5\' 1"  (1.549 m)  Weight: 144 lb (65.318 kg)  SpO2: 98%         Assessment:       ICD-9-CM ICD-10-CM   1. Sarcoidosis ([place]) 135 D86.9   2. 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 Chronic cough 786.2 R05   4. Encounter for therapeutic drug monitoring V58.83 Z51.81        Plan:  PLAN - Continue  SALINE NASAL SPRAY -  Continue generic fluticasone inhaler 2 squirts each nostril daily  - tAKE PRESCRIPTION   - Continue prednisone at 5 mg once daily  - Continue methotrexate 5 mg tablet  once a week on Mondays or Sundays  - we will do 90d refill  - Continue folic acid 1 mg once daily  - COntinue cough syrup as needed  - DO REFILL for 30 days but will post date it 3 months from 10/18/2015' - Cotninue to hold off on bactrim prophylaxis per our mutual decision from prior visits  -  Do CBC, BMET and LFT 10/18/2015  - Followup 6 months   - cough score in 6 months at [facility]  - If cough deteriorates will consider CT chest and/or increasing prednisone/methotrexate versus gabapentin therapy     Dr. Brand Males, M.D., [facility].C.P Pulmonary and Critical Care Medicine Staff Physician [place] [facility] Pager: [phone], If no answer or between  15:00h - 7:00h: call [phone]  10/18/2015 9:20 AM

## 2015-11-16 DIAGNOSIS — M26622 Arthralgia of left temporomandibular joint: Secondary | ICD-10-CM | POA: Diagnosis not present

## 2015-11-16 DIAGNOSIS — Z6825 Body mass index (BMI) 25.0-25.9, adult: Secondary | ICD-10-CM | POA: Diagnosis not present

## 2015-12-03 DIAGNOSIS — Z961 Presence of intraocular lens: Secondary | ICD-10-CM | POA: Diagnosis not present

## 2015-12-03 DIAGNOSIS — H52203 Unspecified astigmatism, bilateral: Secondary | ICD-10-CM | POA: Diagnosis not present

## 2015-12-05 ENCOUNTER — Encounter: Payer: Self-pay | Admitting: Internal Medicine

## 2015-12-17 ENCOUNTER — Other Ambulatory Visit (INDEPENDENT_AMBULATORY_CARE_PROVIDER_SITE_OTHER): Payer: Medicare Other

## 2015-12-17 ENCOUNTER — Ambulatory Visit (INDEPENDENT_AMBULATORY_CARE_PROVIDER_SITE_OTHER): Payer: Medicare Other | Admitting: Adult Health

## 2015-12-17 ENCOUNTER — Ambulatory Visit (INDEPENDENT_AMBULATORY_CARE_PROVIDER_SITE_OTHER)
Admission: RE | Admit: 2015-12-17 | Discharge: 2015-12-17 | Disposition: A | Discharge status: Home / Self Care | Payer: Medicare Other | Source: Ambulatory Visit | Attending: Adult Health | Admitting: Adult Health

## 2015-12-17 ENCOUNTER — Encounter: Payer: Self-pay | Admitting: Adult Health

## 2015-12-17 VITALS — BP 126/62 | HR 66 | Temp 97.9°F | Ht 61.0 in | Wt 147.0 lb

## 2015-12-17 DIAGNOSIS — R0781 Pleurodynia: Secondary | ICD-10-CM | POA: Insufficient documentation

## 2015-12-17 DIAGNOSIS — D72819 Decreased white blood cell count, unspecified: Secondary | ICD-10-CM

## 2015-12-17 DIAGNOSIS — D869 Sarcoidosis, unspecified: Secondary | ICD-10-CM | POA: Diagnosis not present

## 2015-12-17 DIAGNOSIS — R0789 Other chest pain: Secondary | ICD-10-CM | POA: Diagnosis not present

## 2015-12-17 DIAGNOSIS — R109 Unspecified abdominal pain: Secondary | ICD-10-CM

## 2015-12-17 HISTORY — DX: Pleurodynia: R07.81

## 2015-12-17 LAB — CBC WITH DIFFERENTIAL/PLATELET
BASOS ABS: 0 10*3/uL (ref 0.0–0.1)
BASOS PCT: 0.2 % (ref 0.0–3.0)
EOS ABS: 0.1 10*3/uL (ref 0.0–0.7)
Eosinophils Relative: 1.2 % (ref 0.0–5.0)
HCT: 34.6 % — ABNORMAL LOW (ref 36.0–46.0)
Hemoglobin: 12 g/dL (ref 12.0–15.0)
LYMPHS ABS: 0.9 10*3/uL (ref 0.7–4.0)
Lymphocytes Relative: 16.1 % (ref 12.0–46.0)
MCHC: 34.7 g/dL (ref 30.0–36.0)
MCV: 100.5 fl — ABNORMAL HIGH (ref 78.0–100.0)
Monocytes Absolute: 0.6 10*3/uL (ref 0.1–1.0)
Monocytes Relative: 11.2 % (ref 3.0–12.0)
NEUTROS ABS: 3.9 10*3/uL (ref 1.4–7.7)
NEUTROS PCT: 71.3 % (ref 43.0–77.0)
PLATELETS: 221 10*3/uL (ref 150.0–400.0)
RBC: 3.44 Mil/uL — ABNORMAL LOW (ref 3.87–5.11)
RDW: 14.4 % (ref 11.5–15.5)
WBC: 5.5 10*3/uL (ref 4.0–10.5)

## 2015-12-17 NOTE — Assessment & Plan Note (Signed)
Right sided pleuritic chest wall pain ? Etiology  CXR and labs unrevealing  Exam is stable  Advised if not improving will need further evaluation   Plan  Use warm heat to ribs.  May take Ibuprofen .As needed  With food.  May try gas-x As needed   If not improved or worsens will need to come back or see primary MD for further evaluation  Please contact office for sooner follow up if symptoms do not improve or worsen or seek emergency care  follow up Dr. Chase Caller as planned and As needed

## 2015-12-17 NOTE — Progress Notes (Signed)
Subjective:    Patient ID: [name] Page, female    DOB: [birth year], 82 y.o.   MRN: [id number]  HPI 82 yo female with Sarcoidosis w/ associated RML collapse and ocular sarcoid. Has chronic cough felt secondary to irritable laynx syndrome.    12/17/2015 Acute OV  Pt presents for an acute office visit. Complains of right  sided sharp rib pain starting this morning.  Denies any SOB, wheezing, chest congestion/tightness, sinus pressure/drainage, fever, nausea or vomiting.  Pain on inspiration .  She denies gas, n/v/d, chest pain, palpitations, shoulder pain, back pain , edema.  Felt fine when she got up . Went to gym and noticed that she had sharp pain along ribs.  Worried something is wrong with her lung . Feels fine just has pain up under right ribs.  Had BM x 2 this am , no bloody stools.  Denies increased cough, congestion , fever, or orthopnea, rash , calf pain, urinary sx.  No exertional chest pain or syncope. Or palpitations.  CXR today shows stable RML partial collapse, stable sarcoid changes.  CBC today ok without sign change .nml wbc and hbg.    Past Medical History  Diagnosis Date  . Hypertension   . High cholesterol   . Cervical spondylosis     C3-4 AND C4-5  . Arthritis   . DVT (deep venous thrombosis) ([place]) 2006  . Chest pain 07/28/2008    H/O, normal stress nuclear EF 78%  . Dislocation closed, shoulder 7/14  . Collapsed lung  12/11    being treated at [facility] - in left lung   Current Outpatient Prescriptions on File Prior to Visit  Medication Sig Dispense Refill  . amLODipine-benazepril (LOTREL) 5-10 MG capsule Take 1 capsule by mouth daily.    . Ascorbic Acid (VITAMIN C) 1000 MG tablet Take 1,000 mg by mouth daily.    Marland Kitchen aspirin 325 MG tablet Take 1 tablet by mouth daily as needed.     Marland Kitchen azelastine (OPTIVAR) 0.05 % ophthalmic solution Place 1 drop into both eyes every morning.    . bisacodyl (DULCOLAX) 5 MG EC tablet Take 5 mg by mouth daily as needed for  constipation. For constipation    . Cholecalciferol (VITAMIN D-1000 MAX ST) 1000 UNITS tablet Take 1 tablet by mouth daily.    . fluticasone (FLONASE) 50 MCG/ACT nasal spray Place 2 sprays into both nostrils daily.    . folic acid (FOLVITE) 1 MG tablet TAKE 1 TABLET (1 MG TOTAL) BY MOUTH DAILY. 90 tablet 3  . gemfibrozil (LOPID) 600 MG tablet Take 600 mg by mouth 2 (two) times daily before a meal.    . HYDROcodone-homatropine (HYCODAN) 5-1.5 MG/5ML syrup Take 5 mLs by mouth every 6 (six) hours as needed for cough. 300 mL 0  . ibuprofen (ADVIL,MOTRIN) 200 MG tablet Take 600 mg by mouth daily as needed. For pain    . levothyroxine (SYNTHROID, LEVOTHROID) 25 MCG tablet Take 1 tablet by mouth daily.    Marland Kitchen LORazepam (ATIVAN) 0.5 MG tablet as needed.    Marland Kitchen losartan (COZAAR) 50 MG tablet Take 1 tablet by mouth daily.    . methotrexate (RHEUMATREX) 5 MG tablet Take 1 tablet (5 mg total) by mouth once a week. On Mondays or Sundays.  Caution: Chemotherapy. Protect from light. 12 tablet 0  . omeprazole (PRILOSEC) 20 MG capsule Take 1 capsule by mouth daily as needed.   7  . predniSONE (DELTASONE) 5 MG tablet TAKE 1 TABLET BY MOUTH  EVERY DAY WITH BREAKFAST 90 tablet 3   No current facility-administered medications on file prior to visit.      Review of Systems Constitutional:   No  weight loss, night sweats,  Fevers, chills, fatigue, or  lassitude.  HEENT:   No headaches,  Difficulty swallowing,  Tooth/dental problems, or  Sore throat,                No sneezing, itching, ear ache, nasal congestion, post nasal drip,   CV:  No chest pain,  Orthopnea, PND, swelling in lower extremities, anasarca, dizziness, palpitations, syncope.   GI  No heartburn, indigestion, abdominal pain, nausea, vomiting, diarrhea, change in bowel habits, loss of appetite, bloody stools.   Resp: No shortness of breath with exertion or at rest.  No excess mucus, no productive cough,  No non-productive cough,  No coughing up of  blood.  No change in color of mucus.  No wheezing.  No chest wall deformity + rib  Pain on right   Skin: no rash or lesions.  GU: no dysuria, change in color of urine, no urgency or frequency.  No flank pain, no hematuria   MS:  No joint pain or swelling.  No decreased range of motion.  No back pain.  Psych:  No change in mood or affect. No depression or anxiety.  No memory loss.          Objective:   Physical Exam  Filed Vitals:   12/17/15 1026  BP: 126/62  Pulse: 66  Temp: 97.9 F (36.6 C)  TempSrc: Oral  Height: 5\' 1"  (1.549 m)  Weight: 147 lb (66.679 kg)  SpO2: 99%   GEN: A/Ox3; pleasant , NAD, elderly   HEENT:  /AT,  EACs-clear, TMs-wnl, NOSE-clear, THROAT-clear, no lesions, no postnasal drip or exudate noted.   NECK:  Supple w/ fair ROM; no JVD; normal carotid impulses w/o bruits; no thyromegaly or nodules palpated; no lymphadenopathy.  RESP  Clear  P & A; w/o, wheezes/ rales/ or rhonchi.no accessory muscle use, no dullness to percussion. No rib deformity noted.   CARD:  RRR, no m/r/g  , no peripheral edema, pulses intact, no cyanosis or clubbing.  GI:   Soft & nt; nml bowel sounds; no organomegaly or masses detected. No guarding or rebound, neg murphys sign.   Musco: Warm bil, no deformities or joint swelling noted.   Neuro: alert, no focal deficits noted.    Skin: Warm, no lesions or rashes  CXR reviewed independently  Stable sarcoid changes   Cedar Roseman NP-C  [facility]  12/17/2015       Assessment & Plan:

## 2015-12-17 NOTE — Assessment & Plan Note (Signed)
Appears stable without flare   Plan  No changes  

## 2015-12-17 NOTE — Patient Instructions (Signed)
Use warm heat to ribs.  May take Ibuprofen .As needed  With food.  May try gas-x As needed   If not improved or worsens will need to come back or see primary MD for further evaluation  Please contact office for sooner follow up if symptoms do not improve or worsen or seek emergency care  follow up Dr. Chase Caller as planned and As needed

## 2015-12-25 NOTE — Progress Notes (Signed)
Quick Note:  Called spoke with pt. Reviewed results and recs. Pt voiced understanding and had no further questions. ______ 

## 2016-02-20 DIAGNOSIS — M545 Low back pain: Secondary | ICD-10-CM | POA: Diagnosis not present

## 2016-02-25 DIAGNOSIS — M545 Low back pain: Secondary | ICD-10-CM | POA: Diagnosis not present

## 2016-03-24 DIAGNOSIS — I10 Essential (primary) hypertension: Secondary | ICD-10-CM | POA: Diagnosis not present

## 2016-03-24 DIAGNOSIS — E784 Other hyperlipidemia: Secondary | ICD-10-CM | POA: Diagnosis not present

## 2016-03-28 DIAGNOSIS — Z1212 Encounter for screening for malignant neoplasm of rectum: Secondary | ICD-10-CM | POA: Diagnosis not present

## 2016-04-02 DIAGNOSIS — I10 Essential (primary) hypertension: Secondary | ICD-10-CM | POA: Diagnosis not present

## 2016-04-02 DIAGNOSIS — M1990 Unspecified osteoarthritis, unspecified site: Secondary | ICD-10-CM | POA: Diagnosis not present

## 2016-04-02 DIAGNOSIS — E784 Other hyperlipidemia: Secondary | ICD-10-CM | POA: Diagnosis not present

## 2016-04-02 DIAGNOSIS — D692 Other nonthrombocytopenic purpura: Secondary | ICD-10-CM | POA: Diagnosis not present

## 2016-04-02 DIAGNOSIS — J984 Other disorders of lung: Secondary | ICD-10-CM | POA: Diagnosis not present

## 2016-04-02 DIAGNOSIS — Z0000 Encounter for general adult medical examination without abnormal findings: Secondary | ICD-10-CM | POA: Diagnosis not present

## 2016-04-02 DIAGNOSIS — M545 Low back pain: Secondary | ICD-10-CM | POA: Diagnosis not present

## 2016-04-02 DIAGNOSIS — Z6825 Body mass index (BMI) 25.0-25.9, adult: Secondary | ICD-10-CM | POA: Diagnosis not present

## 2016-04-02 DIAGNOSIS — D869 Sarcoidosis, unspecified: Secondary | ICD-10-CM | POA: Diagnosis not present

## 2016-04-07 ENCOUNTER — Other Ambulatory Visit: Payer: Self-pay | Admitting: Internal Medicine

## 2016-04-16 DIAGNOSIS — M545 Low back pain: Secondary | ICD-10-CM | POA: Diagnosis not present

## 2016-04-16 DIAGNOSIS — M25552 Pain in left hip: Secondary | ICD-10-CM | POA: Diagnosis not present

## 2016-04-22 ENCOUNTER — Other Ambulatory Visit (INDEPENDENT_AMBULATORY_CARE_PROVIDER_SITE_OTHER): Payer: Medicare Other

## 2016-04-22 ENCOUNTER — Ambulatory Visit: Payer: Medicare Other | Admitting: Internal Medicine

## 2016-04-22 ENCOUNTER — Telehealth: Payer: Self-pay | Admitting: Internal Medicine

## 2016-04-22 ENCOUNTER — Encounter: Payer: Self-pay | Admitting: Internal Medicine

## 2016-04-22 ENCOUNTER — Ambulatory Visit (INDEPENDENT_AMBULATORY_CARE_PROVIDER_SITE_OTHER): Payer: Medicare Other | Admitting: Internal Medicine

## 2016-04-22 VITALS — BP 138/74 | HR 68 | Ht 61.0 in | Wt 141.6 lb

## 2016-04-22 DIAGNOSIS — R05 Cough: Secondary | ICD-10-CM | POA: Diagnosis not present

## 2016-04-22 DIAGNOSIS — J9819 Other pulmonary collapse: Secondary | ICD-10-CM

## 2016-04-22 DIAGNOSIS — Z5181 Encounter for therapeutic drug level monitoring: Secondary | ICD-10-CM

## 2016-04-22 DIAGNOSIS — D869 Sarcoidosis, unspecified: Secondary | ICD-10-CM

## 2016-04-22 DIAGNOSIS — R053 Chronic cough: Secondary | ICD-10-CM

## 2016-04-22 DIAGNOSIS — E875 Hyperkalemia: Secondary | ICD-10-CM

## 2016-04-22 LAB — BASIC METABOLIC PANEL
BUN: 13 mg/dL (ref 6–23)
CALCIUM: 9.5 mg/dL (ref 8.4–10.5)
CO2: 28 mEq/L (ref 19–32)
Chloride: 103 mEq/L (ref 96–112)
Creatinine, Ser: 0.84 mg/dL (ref 0.40–1.20)
GFR: 68.84 mL/min (ref >60.00)
Glucose, Bld: 112 mg/dL — ABNORMAL HIGH (ref 70–99)
Potassium: 6 mEq/L — ABNORMAL HIGH (ref 3.5–5.1)
SODIUM: 136 meq/L (ref 135–145)

## 2016-04-22 LAB — HEPATIC FUNCTION PANEL
ALK PHOS: 53 U/L (ref 39–117)
ALT: 11 U/L (ref 0–35)
AST: 16 U/L (ref 0–37)
Albumin: 4.2 g/dL (ref 3.5–5.2)
BILIRUBIN DIRECT: 0.1 mg/dL (ref 0.0–0.3)
Total Bilirubin: 0.4 mg/dL (ref 0.2–1.2)
Total Protein: 7.9 g/dL (ref 6.0–8.3)

## 2016-04-22 LAB — CBC
HEMATOCRIT: 34 % — AB (ref 36.0–46.0)
Hemoglobin: 11.8 g/dL — ABNORMAL LOW (ref 12.0–15.0)
MCHC: 34.7 g/dL (ref 30.0–36.0)
MCV: 101 fl — AB (ref 78.0–100.0)
Platelets: 250 10*3/uL (ref 150.0–400.0)
RBC: 3.37 Mil/uL — ABNORMAL LOW (ref 3.87–5.11)
RDW: 14.9 % (ref 11.5–15.5)
WBC: 6.1 10*3/uL (ref 4.0–10.5)

## 2016-04-22 MED ORDER — FOLIC ACID 1 MG PO TABS: ORAL_TABLET | ORAL | 3 refills | Status: DC

## 2016-04-22 MED ORDER — METHOTREXATE SODIUM 5 MG PO TABS: 5.0000 mg | ORAL_TABLET | ORAL | 3 refills | Status: DC

## 2016-04-22 NOTE — Telephone Encounter (Signed)
LEt Ernesto Rutherford know that labs stable except Potassium high - could be hemolysis or could be related to ace inhibitor Also ask if she Is taking potassium supplement  PLAN - dc potassium spplement if she is on it - why is she on ace inhibitor anywyas - she has cough - repeat bmet next 1-2 days   Recent Labs Lab 04/22/16 1150  HGB 11.8*  HCT 34.0*  WBC 6.1  PLT 250.0    Recent Labs Lab 04/22/16 1150  NA 136  K 6.0*  CL 103  CO2 28  GLUCOSE 112*  BUN 13  CREATININE 0.84  CALCIUM 9.5    Recent Labs Lab 04/22/16 1150  AST 16  ALT 11  ALKPHOS 53  BILITOT 0.4  PROT 7.9  ALBUMIN 4.2

## 2016-04-22 NOTE — Progress Notes (Signed)
Subjective:     Patient ID: [name] Page, female   DOB: [birth year], 82 y.o.   MRN: [id number]  PCP Geoffery Lyons, MD   HPI     OV 08/24/2014  Chief Complaint  Patient presents with  . Follow-up    Pt stated the gabapentin has not changed her breathing or cough. Pt c/o prod cough with white mucus. Pt denies SOB and CP/tightness.    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 Main symptom is severe chronic cough with high RSI score suggestive of irritable larynx syndrome that is associated as well  She always finds prednisone to help the cough but due to age and side effects despite good functional status she opts to take a low-dose of prednisone. Currently she is on 5 mg prednisone. In November 2015 due to RSI score of 27 for cough but decided to trial gabapentin to tackle the neurogenic component of cough. She reports now that the gabapentin has not helped her at all although objectively the RSI cough score has drop to 20. She is very frustrated. She and her husband are reluctant to have right middle lobectomy given her advanced age despite good functional status. There are no open to trying methotrexate to tackle the inflammation of sarcoidosis and as a steroid sparing agent. She also wants to come off gabapentin   OV 10/02/2014  Chief Complaint  Patient presents with  . Follow-up    Pt states her breathing is doing well. Pt states her SOB has improved since last OV. Pt c/o mild cough. Pt denies CP/tightness. Pt stated being on methotrexate has significantly helped.     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 Main symptom is severe chronic cough with high RSI score suggestive of irritable larynx syndrome that is associated as well    Last visit in January 2016 she told me that gabapentin did not help her cough. So we stopped this. We started methotrexate for inflammation  in her airway due to  sarcoid causing right middle lobe collapse. We left her on prednisone 5 mg per day and started her on methotrexate 5 mg once a week. Also gave Hycodan cough syrup. She says that the Hycodan cough syrup did not help However, 2 weeks after starting her methotrexate she started noticing dramatic improvement in her cough. She hardly coughs now. She has significant improvement in her quality of life and she is extremely happy.  RSI cough score had dropped from 25 to 20 with gabapentin has now dropped to 5.  She is asking questions about how long to take methotrexate. And also review of the side effect profile. Of note I did notice that she is on ACE inhibitor that can potentially provoke all make her cough worse. She is not taking gabapentin anymore. There is no fever or chills. At this point in time she's not interested in Bactrim PCP prophylaxis which should be okay given low-dose of methotrexate and prednisone  Past, Family, Social reviewed: no change since last visit    OV 01/02/2015  Chief Complaint  Patient presents with  . Follow-up    Pt recently went to opthamologist and pts vision has decreased since last OV. Pt stated her breathing is unchanged since last OV. Pt c/o mild dry cough. Pt denies CP/tightness.     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 Main symptom is severe chronic cough with high  RSI score suggestive of irritable larynx syndrome that is associated as well  Cough /Saarcoid / IRritiable LArynx/ RML syundrome - cough persists but is only mild and baseline. No problems. Does not feel it impacts qualkity of life. She is off ace inhibitor now  ; forgot to mention this during med reconciliation. No asociated wheeze or dyspnea. Maintained on mehtotrxate 5mg  weekly, folic acid and prednisone 5mg  daily. No evidence of infection. Last LFT was 3 months ago and normal. Hycodan helping cough and wants refill  NEw  issue - her eye doc Dr Ellie Lunch called yesterday. PAtient has no complaints of vision but post capsulootmy few week ago and now yesterday visual field deficit on Rt side is worse. Concern is ocular sarcoid is worse. They are referring her to [facility]. PAtient does not want step up Rx of sarcoid till all this is sorted out  New issue - noticed some bruising in left forearm yesterday sponatenous x 2 spots. New. Asytmptomatcic and last 1 weeks some punctate red lesion in distal 1/3 of leg. Associatd with gardening but denies poison ivy. Unchanged since insidious onset. No associatd fever.    OV 04/09/2015  Chief Complaint  Patient presents with  . Follow-up    Pt denies cough, SOB, CP/tightness. Pt states her breathing is doing well. Pt states she had a recent rash on BIL lower extremities and saw derm and was given a topical ointment that helped. Per pt the derm thought the rash was not related to sarcoid.     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 Main symptom is severe chronic cough with high RSI score suggestive of irritable larynx syndrome that is associated as well   Cough /Saarcoid / IRritiable LArynx/ RML syundrome - cough is significantly improved. RSI cough score is only 14. She feels that the cough is at a stage where it is completely fine and she can handle things. She is maintained on methotrexate 5 mg weekly, folic acid and prednisone 5 mg daily. She did have some lowish white count after last visit and anemia but we held to the methotrexate and it seems to be stable. She also had a rash at the time of last visit she has seen dermatology and apparently the biopsies do not show this is due to sarcoid. I personally review this outside chart and confirmed that. Of note her last chest x-ray was November 2015 which showed continued right middle lobe collapse. The cough is controlled with the methotrexate and prednisone. She is no longer on ACE  inhibitor even though our MAR reports that Also noted that she reports some mild weight gain with prednisone but she says she will try dietary measures to shake this weight off. She does not want to cough to come and therefore she is content continue with the methotrexate and prednisone. In terms of her vision she is [facility] in October 2016.    OV 07/16/2015  Chief Complaint  Patient presents with  . Follow-up    Pt states her breathing is unchanged but her cough has worsened. Pt states her SOB is at her baseline. Pt states her cough is nonproductive and worse in the evening. Pt denies CP/tightness.     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 Main symptom is severe chronic cough with high RSI score suggestive of irritable larynx syndrome that is associated as well   Cough /Saarcoid /  IRritiable LArynx/ RML syundrome -she now reports that her cough is worse for the last few months especially since last visit. RSI cough score is 18 and subjectively she feels that cough is worse by 25%. Otherwise she is okay. Cough is associated with worsening postnasal drip. She is also asking for refills on a cough syrup methotrexate folic acid. She prefers to have 90 day refills. Despite cough worsened she is not too keen on gabapentin therapy and wants to try other simpler measures at this point in time. There is no fever or sputum production that is associated with the cough.  In terms of her ophthalmology issues she has been reassured apparently it is a left optic now that is problematic in this 50% gone. No active follow-up.   OV 10/18/2015   Chief Complaint  Patient presents with  . Follow-up    Pt states her cough has significantly improved. Pt denies SOB and CP/tightness. Pt states overall she feels she is doing well.     Follow-up chronic cough associated with right middle lobe syndrome secondary to  sarcoidosis and irritable larynx syndrome  Last visit December 2016. At the time cough that deteriorated. The significant amount of postnasal drip. We instituted nasal steroids and with this cough is significantly improved and back to baseline. RSI cough score is 10 which is around her baseline. She feels immensely better. She is in a good phase of her health. There are no new issues. She is pending $24 and over-the-counter nasal steroid and we discussed about doing a generic prescription which might be cheaper. She wants refill postdated on her cough syrup. She wants 90 day refills on her methotrexate. She is happy with having a next follow-up in 6 months.   OV 04/22/2016  Chief Complaint  Patient presents with  . Follow-up    Pt states her breathing is at baseline. Pt states she is doing well. Pt states has a rare cough, not needing cough syrup any longer. Pt denies CP/tightness and f/c/s.      follow-up chronic cough associated with right middle lobe syndrome secondary to sarcoidosis [also ocular sarcoid] and irritable larynx syndrome  Last visit was me was in March 2017. In the interim she has visited [employer]. She is on prednisone 5 mg per day and also methotrexate 5 mg once a week.Her cough is significantly improved. She had a chest x-ray May 2017 that I personally visualized shows significant improvement in her right middle lobe laps compared to 2011/2012. She's not on the needing cough syrup. She has right hip DJD now and apparently hip surgery might be contemplated. She is using nasal steroid. She does not do Bactrim prophylaxis. RSI cough score show significant improvement with score of 6 which is only mild cough.   Dr Lorenza [place] Reflux Symptom Index (> 13-15 suggestive of LPR cough) Dec 2016 10/18/2015  04/22/2016   Hoarseness of problem with voice 2 3 3   Clearing  Of Throat 3 2 1   Excess throat mucus or feeling of post nasal drip 1 1 0  Difficulty swallowing food, liquid or  tablets 2 1 0  Cough after eating or lying down 3 0 0  Breathing difficulties or choking episodes 1 0 0  Troublesome or annoying cough 3 1 0  Sensation of something sticking in throat or lump in throat 0 1 0  Heartburn, chest pain, indigestion, or stomach acid coming up 3 1 2   TOTAL 18 10 6       has  a past medical history of Arthritis; Cervical spondylosis; Chest pain (07/28/2008); Collapsed lung ( 12/11); Dislocation closed, shoulder (7/14); DVT (deep venous thrombosis) ([place]) (2006); High cholesterol; and Hypertension.   reports that she has never smoked. She has never used smokeless tobacco.  Past Surgical History:  Procedure Laterality Date  . FOOT SURGERY  06/2005  . HEMORRHOID SURGERY  12/1972  . INTRAOCULAR LENS INSERTION     right eye 10 /16 and left 06/16/2005  . SKIN BIOPSY  01/2007   basal cell carcinoma removed from right lower leg   . TOTAL HIP ARTHROPLASTY  06/2007   left  . TOTAL KNEE ARTHROPLASTY  10/2007   right    No Known Allergies  Immunization History  Administered Date(s) Administered  . Influenza Split 04/11/2013, 04/11/2014  . Influenza Whole 04/15/2010  . Influenza,inj,Quad PF,36+ Mos 05/07/2015  . Pneumococcal Polysaccharide-23 05/13/2004  . Pneumococcal-Unspecified 08/11/2010, 03/11/2014  . Td 02/08/2002  . Zoster 06/12/2007    Family History  Problem Relation Age of Onset  . Diabetes Mother   . Heart failure Mother   . Hypertension Father   . Diabetes Father   . Heart disease Father   . Heart attack Father      Current Outpatient Prescriptions:  .  amLODipine-benazepril (LOTREL) 5-10 MG capsule, Take 1 capsule by mouth daily., Disp: , Rfl:  .  Ascorbic Acid (VITAMIN C) 1000 MG tablet, Take 1,000 mg by mouth daily., Disp: , Rfl:  .  aspirin 325 MG tablet, Take 1 tablet by mouth daily as needed. , Disp: , Rfl:  .  azelastine (OPTIVAR) 0.05 % ophthalmic solution, Place 1 drop into both eyes every morning., Disp: , Rfl:  .  bisacodyl  (DULCOLAX) 5 MG EC tablet, Take 5 mg by mouth daily as needed for constipation. For constipation, Disp: , Rfl:  .  Cholecalciferol (VITAMIN D-1000 MAX ST) 1000 UNITS tablet, Take 1 tablet by mouth daily., Disp: , Rfl:  .  fluticasone (FLONASE) 50 MCG/ACT nasal spray, Place 2 sprays into both nostrils daily., Disp: , Rfl:  .  folic acid (FOLVITE) 1 MG tablet, TAKE 1 TABLET (1 MG TOTAL) BY MOUTH DAILY., Disp: 90 tablet, Rfl: 3 .  gemfibrozil (LOPID) 600 MG tablet, Take 600 mg by mouth 2 (two) times daily before a meal., Disp: , Rfl:  .  ibuprofen (ADVIL,MOTRIN) 200 MG tablet, Take 600 mg by mouth daily as needed. For pain, Disp: , Rfl:  .  levothyroxine (SYNTHROID, LEVOTHROID) 25 MCG tablet, Take 1 tablet by mouth daily., Disp: , Rfl:  .  LORazepam (ATIVAN) 0.5 MG tablet, as needed., Disp: , Rfl:  .  losartan (COZAAR) 50 MG tablet, Take 1 tablet by mouth daily., Disp: , Rfl:  .  methotrexate (RHEUMATREX) 5 MG tablet, Take 1 tablet (5 mg total) by mouth once a week. On Mondays or Sundays.  Caution: Chemotherapy. Protect from light., Disp: 12 tablet, Rfl: 0 .  omeprazole (PRILOSEC) 20 MG capsule, Take 1 capsule by mouth daily as needed. , Disp: , Rfl: 7 .  predniSONE (DELTASONE) 5 MG tablet, TAKE 1 TABLET BY MOUTH EVERY DAY WITH BREAKFAST, Disp: 90 tablet, Rfl: 3    Review of Systems     Objective:   Physical Exam  Constitutional: She is oriented to person, place, and time. She appears well-developed and well-nourished. No distress.  HENT:  Head: Normocephalic and atraumatic.  Right Ear: External ear normal.  Left Ear: External ear normal.  Mouth/Throat: Oropharynx is clear and  moist. No oropharyngeal exudate.  Eyes: Conjunctivae and EOM are normal. Pupils are equal, round, and reactive to light. Right eye exhibits no discharge. Left eye exhibits no discharge. No scleral icterus.  Neck: Normal range of motion. Neck supple. No JVD present. No tracheal deviation present. No thyromegaly present.   Cardiovascular: Normal rate, regular rhythm, normal heart sounds and intact distal pulses.  Exam reveals no gallop and no friction rub.   No murmur heard. Pulmonary/Chest: Effort normal and breath sounds normal. No respiratory distress. She has no wheezes. She has no rales. She exhibits no tenderness.  Abdominal: Soft. Bowel sounds are normal. She exhibits no distension and no mass. There is no tenderness. There is no rebound and no guarding.  Musculoskeletal: Normal range of motion. She exhibits no edema or tenderness.  Limps on right side  Lymphadenopathy:    She has no cervical adenopathy.  Neurological: She is alert and oriented to person, place, and time. She has normal reflexes. No cranial nerve deficit. She exhibits normal muscle tone. Coordination normal.  Skin: Skin is warm and dry. No rash noted. She is not diaphoretic. No erythema. No pallor.  Psychiatric: She has a normal mood and affect. Her behavior is normal. Judgment and thought content normal.  Vitals reviewed.     Estimated body mass index is 26.76 kg/m as calculated from the following:   Height as of this encounter: 5\' 1"  (1.549 m).   Weight as of this encounter: 141 lb 9.6 oz (64.2 kg).   Vitals:   04/22/16 1113  BP: 138/74  Pulse: 68  SpO2: 97%  Weight: 141 lb 9.6 oz (64.2 kg)  Height: 5\' 1"  (1.549 m)     Assessment:       ICD-9-CM ICD-10-CM   1. Sarcoidosis ([place]) 135 D86.9   2. 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 Chronic cough 786.2 R05   4. Encounter for therapeutic drug monitoring V58.83 Z51.81        Plan:       - disease is stable clinically baed on CXR May 2017 (significantly better since 2011) and on history  04/22/2016  - cough is improved with treatment below and glad you are not needing cough syrup  - Low risk for pulmonary complications from hip surgery  PLAN -  Continue generic fluticasone inhaler 2 squirts each nostril daily  - Continue prednisone but reduce from 5 mg to  4mg  once daily  - Continue methotrexate 5 mg tablet once a week on Mondays or Sundays  - we will do 90d refill  - over next 6-12 months consider weaning off methottrexate   - Continue folic acid 1 mg once daily  - Cotninue to hold off on bactrim prophylaxis per our mutual decision from prior visits  -  Do CBC, BMET and LFT 04/22/2016 - High dose flu shot through PCP  - Followup 6 months or sooner if needed   - cough score in 6 months at [facility]    Dr. Brand Males, M.D., [facility] [place].C.P Pulmonary and Critical Care Medicine Staff Physician [place] [facility] Pager: [phone], If no answer or between  15:00h - 7:00h: call [phone]  04/22/2016 11:35 AM

## 2016-04-22 NOTE — Patient Instructions (Addendum)
#  Sarcoidosis - with Right Middle Lobe Syndrome and cough #CHronic cough - due to above + Irritable larynd #ENcounter for therapeutic monitoring   - disease is stable clinically baed on CXR May 2017 (significantly better since 2011) and on history  04/22/2016  - cough is improved with treatment below and glad you are not needing cough syrup  - Low risk for pulmonary complications from hip surgery  PLAN -  Continue generic fluticasone inhaler 2 squirts each nostril daily  - Continue prednisone but reduce from 5 mg to 4mg  once daily  - Continue methotrexate 5 mg tablet once a week on Mondays or Sundays  - we will do 90d refill  - over next 6-12 months consider weaning off methottrexate   - Continue folic acid 1 mg once daily  - Cotninue to hold off on bactrim prophylaxis per our mutual decision from prior visits  -  Do CBC, BMET and LFT 04/22/2016 - High dose flu shot through PCP  - Followup 6 months or sooner if needed   - cough score in 6 months at [facility]

## 2016-04-22 NOTE — Addendum Note (Signed)
Addended by: Collier Salina on: 04/22/2016 11:40 AM   Modules accepted: Orders

## 2016-04-23 DIAGNOSIS — M25552 Pain in left hip: Secondary | ICD-10-CM | POA: Diagnosis not present

## 2016-04-23 DIAGNOSIS — M545 Low back pain: Secondary | ICD-10-CM | POA: Diagnosis not present

## 2016-04-23 NOTE — Telephone Encounter (Signed)
She needs to stop the losartan potassium due to the potassium componenet   Dr. Brand Males, M.D., [facility].C.P Pulmonary and Critical Care Medicine Staff Physician [place] [facility] Pager: [phone], If no answer or between  15:00h - 7:00h: call [phone]  04/23/2016 1:32 PM

## 2016-04-23 NOTE — Telephone Encounter (Signed)
Called and spoke to pt. Pt is taking Losartan Potassium but is not taking a potassium supplement. Pt is not on ace inhibitor she is on Losartan and Amlodipine (med list updated). Order placed for BMET, pt is coming in on Friday 9/15 for repeat BMET.   MR please advise since pt is not taking a potassium supplement

## 2016-04-23 NOTE — Telephone Encounter (Signed)
Patient returning call - she can be reached at [phone]

## 2016-04-23 NOTE — Telephone Encounter (Signed)
Attempted to contact patient, left message for patient to return call.

## 2016-04-23 NOTE — Telephone Encounter (Signed)
Patient notified of MR's recommendations.  Patient wants to know if she needs to start back on Lotrel.    MR - please advise.

## 2016-04-25 ENCOUNTER — Other Ambulatory Visit (INDEPENDENT_AMBULATORY_CARE_PROVIDER_SITE_OTHER): Payer: Medicare Other

## 2016-04-25 DIAGNOSIS — E875 Hyperkalemia: Secondary | ICD-10-CM | POA: Diagnosis not present

## 2016-04-25 LAB — BASIC METABOLIC PANEL
BUN: 17 mg/dL (ref 6–23)
CHLORIDE: 102 meq/L (ref 96–112)
CO2: 28 mEq/L (ref 19–32)
Calcium: 9 mg/dL (ref 8.4–10.5)
Creatinine, Ser: 0.83 mg/dL (ref 0.40–1.20)
GFR: 69.8 mL/min (ref >60.00)
Glucose, Bld: 100 mg/dL — ABNORMAL HIGH (ref 70–99)
POTASSIUM: 4.4 meq/L (ref 3.5–5.1)
SODIUM: 138 meq/L (ref 135–145)

## 2016-04-26 DIAGNOSIS — Z23 Encounter for immunization: Secondary | ICD-10-CM | POA: Diagnosis not present

## 2016-04-28 NOTE — Telephone Encounter (Signed)
Pt and husband has many questions regarding the medication changes and do not feel these changes are adequate and do not understand. Husband does not agree with the patient coming off of the Losartan d/t her potassium levels being elevated - states that there is not enough Potassium in the Losartan to cause any elevated chemical levels. Requesting a call back from Dr Chase Caller to discuss his rec's below.

## 2016-04-28 NOTE — Telephone Encounter (Signed)
She cannot have lotrel because it has ace inhibitor. Ok for the [employer] which is in lotrel. Also, her K was high so losartan potassium wont work. She can do amlodpine 10mg  per day and then have her PCP ARONSON,RICHARD A, MD adjust her bp with other meds  Dr. Brand Males, M.D., [facility].C.P Pulmonary and Critical Care Medicine Staff Physician [place] [facility] Pager: [phone], If no answer or between  15:00h - 7:00h: call [phone]  04/28/2016 3:50 PM

## 2016-04-28 NOTE — Telephone Encounter (Signed)
Spoke with [name] Page, agreed that this needs to wait for MR to clarify and when he returns as this is not something that needs to be addressed by the DOD. Dr Chase Caller is off for the next few days. [name] Page aware and agreed to this as well.  Will send to MR to address upon his return back from out of town -- no med changes made at this time, [name] Page would like to wait until all is discussed with MR first.

## 2016-04-30 NOTE — Telephone Encounter (Signed)
Please let [name] Page or her husband know - that I  Ok not to do changes and for her to continue losartan potassium and maybe high K was due to hemolysis. So, ok to continue losaratan potassium but recheck bmet next week. Pleases send msg back to me nd I can call them 05/01/16

## 2016-04-30 NOTE — Telephone Encounter (Signed)
lvm for pt to return call °

## 2016-05-01 DIAGNOSIS — Z96651 Presence of right artificial knee joint: Secondary | ICD-10-CM | POA: Diagnosis not present

## 2016-05-01 DIAGNOSIS — Z471 Aftercare following joint replacement surgery: Secondary | ICD-10-CM | POA: Diagnosis not present

## 2016-05-01 DIAGNOSIS — M25551 Pain in right hip: Secondary | ICD-10-CM | POA: Diagnosis not present

## 2016-05-01 NOTE — Telephone Encounter (Signed)
Noted. Will close this encounter. Nothing further needed.

## 2016-05-01 NOTE — Telephone Encounter (Signed)
Pt aware of rec's per MR Pt requests a call back to discuss this with MR.    So if were are staying on the Losartan, are there any medication changes to be made or anything that needs to be sent into the pharmacy?   Will send to MR as FYI.

## 2016-05-01 NOTE — Telephone Encounter (Signed)
spokke to [employer] - she repeated her bmet and was 4.4 and in fact she did not hold losartan when she did recheck. So, possuibly related to ? Tomatoes she ate or hemolysis. She has enough supply of losartan. No change in therapy. No need to repeat draw . ROV per office visit plan.   Dr. Brand Males, M.D., [facility].C.P Pulmonary and Critical Care Medicine Staff Physician [place] [facility] Pager: [phone], If no answer or between  15:00h - 7:00h: call [phone]  05/01/2016 11:53 AM

## 2016-05-08 DIAGNOSIS — M25551 Pain in right hip: Secondary | ICD-10-CM | POA: Diagnosis not present

## 2016-05-31 ENCOUNTER — Emergency Department (HOSPITAL_COMMUNITY): Payer: Medicare Other

## 2016-05-31 ENCOUNTER — Emergency Department (HOSPITAL_COMMUNITY)
Admission: EM | Admit: 2016-05-31 | Discharge: 2016-05-31 | Disposition: A | Discharge status: Home / Self Care | Payer: Medicare Other | Attending: Emergency Medicine | Admitting: Emergency Medicine

## 2016-05-31 ENCOUNTER — Encounter (HOSPITAL_COMMUNITY): Payer: Self-pay

## 2016-05-31 DIAGNOSIS — S8391XA Sprain of unspecified site of right knee, initial encounter: Secondary | ICD-10-CM | POA: Diagnosis not present

## 2016-05-31 DIAGNOSIS — Y939 Activity, unspecified: Secondary | ICD-10-CM | POA: Diagnosis not present

## 2016-05-31 DIAGNOSIS — Y92012 Bathroom of single-family (private) house as the place of occurrence of the external cause: Secondary | ICD-10-CM | POA: Diagnosis not present

## 2016-05-31 DIAGNOSIS — Y999 Unspecified external cause status: Secondary | ICD-10-CM | POA: Insufficient documentation

## 2016-05-31 DIAGNOSIS — Z79899 Other long term (current) drug therapy: Secondary | ICD-10-CM | POA: Insufficient documentation

## 2016-05-31 DIAGNOSIS — I10 Essential (primary) hypertension: Secondary | ICD-10-CM | POA: Insufficient documentation

## 2016-05-31 DIAGNOSIS — S8991XA Unspecified injury of right lower leg, initial encounter: Secondary | ICD-10-CM | POA: Diagnosis not present

## 2016-05-31 DIAGNOSIS — S76011A Strain of muscle, fascia and tendon of right hip, initial encounter: Secondary | ICD-10-CM | POA: Insufficient documentation

## 2016-05-31 DIAGNOSIS — S51812A Laceration without foreign body of left forearm, initial encounter: Secondary | ICD-10-CM | POA: Diagnosis not present

## 2016-05-31 DIAGNOSIS — W010XXA Fall on same level from slipping, tripping and stumbling without subsequent striking against object, initial encounter: Secondary | ICD-10-CM | POA: Diagnosis not present

## 2016-05-31 DIAGNOSIS — M25551 Pain in right hip: Secondary | ICD-10-CM | POA: Diagnosis not present

## 2016-05-31 DIAGNOSIS — Z7982 Long term (current) use of aspirin: Secondary | ICD-10-CM | POA: Insufficient documentation

## 2016-05-31 DIAGNOSIS — Z96642 Presence of left artificial hip joint: Secondary | ICD-10-CM | POA: Diagnosis not present

## 2016-05-31 DIAGNOSIS — S76019A Strain of muscle, fascia and tendon of unspecified hip, initial encounter: Secondary | ICD-10-CM

## 2016-05-31 DIAGNOSIS — S86911A Strain of unspecified muscle(s) and tendon(s) at lower leg level, right leg, initial encounter: Secondary | ICD-10-CM | POA: Diagnosis not present

## 2016-05-31 DIAGNOSIS — Z96651 Presence of right artificial knee joint: Secondary | ICD-10-CM | POA: Insufficient documentation

## 2016-05-31 DIAGNOSIS — S79911A Unspecified injury of right hip, initial encounter: Secondary | ICD-10-CM | POA: Diagnosis present

## 2016-05-31 MED ORDER — MORPHINE SULFATE (PF) 4 MG/ML IV SOLN
4.0000 mg | Freq: Once | INTRAVENOUS | Status: AC
  Administered 2016-05-31: 4 mg via INTRAVENOUS
  Filled 2016-05-31: qty 1

## 2016-05-31 MED ORDER — METHYLPREDNISOLONE SODIUM SUCC 125 MG IJ SOLR
125.0000 mg | Freq: Once | INTRAMUSCULAR | Status: AC
Start: 2016-05-31 — End: 2016-05-31
  Administered 2016-05-31: 125 mg via INTRAVENOUS
  Filled 2016-05-31: qty 2

## 2016-05-31 MED ORDER — ONDANSETRON HCL 4 MG/2ML IJ SOLN
4.0000 mg | Freq: Once | INTRAMUSCULAR | Status: AC
  Administered 2016-05-31: 4 mg via INTRAVENOUS
  Filled 2016-05-31: qty 2

## 2016-05-31 NOTE — Progress Notes (Signed)
Orthopedic Tech Progress Note Patient Details:  [name] Page 09/26/1930 [id number]  Ortho Devices Type of Ortho Device: Knee Immobilizer Ortho Device/Splint Interventions: Application   [name] 05/31/2016, 11:49 AM

## 2016-05-31 NOTE — ED Notes (Signed)
Patient transported to X-ray 

## 2016-05-31 NOTE — ED Provider Notes (Signed)
[facility] DEPT Provider Note   CSN: [id number] Arrival date & time: 05/31/16  1021     History   Chief Complaint Chief Complaint  Patient presents with  . Knee Injury    HPI [name] Page is a 82 y.o. female.  Pt tripped in the bathroom this morning.  She said that her right knee went the wrong direction.  She has a lot of pain in the knee.  The pt said that she was able to stand on it with a lot of pain.  She sustained a skin tear to her left forearm, but no other injuries.      Past Medical History:  Diagnosis Date  . Arthritis   . Cervical spondylosis    C3-4 AND C4-5  . Chest pain 07/28/2008   H/O, normal stress nuclear EF 78%  . Collapsed lung  12/11   being treated at [facility] - in left lung  . Dislocation closed, shoulder 7/14  . DVT (deep venous thrombosis) ([place]) 2006  . High cholesterol   . Hypertension     Patient Active Problem List   Diagnosis Date Noted  . Pleuritic pain 12/17/2015  . Osteopenia 05/23/2015  . Cystocele 05/23/2015  . Rash and nonspecific skin eruption 01/02/2015  . Visual field defect nasal step 01/02/2015  . Encounter for therapeutic drug monitoring 10/03/2014  . Chronic cough 07/12/2014  . Nasal itching 01/30/2014  . 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 Sarcoidosis ([place]) 01/30/2014  . Cystocele, midline 05/02/2013  . Chest pain   . History of recurrent UTIs 09/24/2011  . Vaginal atrophy 09/24/2011  . HYPERLIPIDEMIA 07/30/2010  . HYPERTENSION 07/30/2010  . DEEP VEIN THROMBOSIS/PHLEBITIS 07/30/2010  . PULMONARY COLLAPSE 07/30/2010  . PULMONARY NODULE 07/30/2010  . COUGH 07/30/2010    Past Surgical History:  Procedure Laterality Date  . FOOT SURGERY  06/2005  . HEMORRHOID SURGERY  12/1972  . INTRAOCULAR LENS INSERTION     right eye 10 /16 and left 06/16/2005  . SKIN BIOPSY  01/2007   basal cell carcinoma removed from right lower leg   . TOTAL HIP ARTHROPLASTY  06/2007   left  . TOTAL KNEE ARTHROPLASTY  10/2007   right    OB History    Gravida Para Term Preterm AB Living   2 2 2     2    SAB TAB Ectopic Multiple Live Births           2       Home Medications    Prior to Admission medications   Medication Sig Start Date End Date Taking? Authorizing Provider  amLODipine (NORVASC) 10 MG tablet Take 10 mg by mouth daily.   Yes Historical Provider, MD  aspirin 325 MG tablet Take 1 tablet by mouth daily as needed for mild pain.    Yes Historical Provider, MD  azelastine (OPTIVAR) 0.05 % ophthalmic solution Place 1 drop into both eyes every morning.   Yes Historical Provider, MD  bisacodyl (DULCOLAX) 5 MG EC tablet Take 5 mg by mouth daily as needed for constipation. For constipation   Yes Historical Provider, MD  fluticasone (FLONASE) 50 MCG/ACT nasal spray Place 2 sprays into both nostrils daily.   Yes Historical Provider, MD  folic acid (FOLVITE) 1 MG tablet TAKE 1 TABLET (1 MG TOTAL) BY MOUTH DAILY. 04/22/16  Yes Brand Males, MD  gemfibrozil (LOPID) 600 MG tablet Take 600 mg by mouth 2 (two) times daily before a meal.   Yes Historical Provider, MD  ibuprofen (ADVIL,MOTRIN) 200 MG tablet Take 600 mg by mouth daily as needed. For pain   Yes Historical Provider, MD  levothyroxine (SYNTHROID, LEVOTHROID) 25 MCG tablet Take 25 mcg by mouth daily.    Yes Historical Provider, MD  LORazepam (ATIVAN) 0.5 MG tablet Take 0.5 mg by mouth every 8 (eight) hours as needed for anxiety or sleep.  03/26/15  Yes Historical Provider, MD  losartan (COZAAR) 50 MG tablet Take 1 tablet by mouth daily. 04/13/15  Yes Historical Provider, MD  methotrexate (RHEUMATREX) 5 MG tablet Take 1 tablet (5 mg total) by mouth once a week. On Mondays or Sundays.  Caution: Chemotherapy. Protect from light. 04/22/16  Yes Brand Males, MD  omeprazole (PRILOSEC) 20 MG capsule Take 20 mg by mouth daily as needed (heartburn).  05/02/15  Yes Historical Provider, MD  predniSONE (DELTASONE) 5 MG tablet TAKE 1 TABLET BY MOUTH EVERY DAY WITH  BREAKFAST 04/08/16  Yes Brand Males, MD    Family History Family History  Problem Relation Age of Onset  . Diabetes Mother   . Heart failure Mother   . Hypertension Father   . Diabetes Father   . Heart disease Father   . Heart attack Father     Social History Social History  Substance Use Topics  . Smoking status: Never Smoker  . Smokeless tobacco: Never Used  . Alcohol use Yes     Comment: BEER/glass of wine A DAY     Allergies   Review of patient's allergies indicates no known allergies.   Review of Systems Review of Systems  Musculoskeletal:       Right knee  Skin:       Skin tear left forearm  All other systems reviewed and are negative.    Physical Exam Updated Vital Signs BP 154/56   Pulse (!) 58   Temp 97.9 F (36.6 C) (Oral)   Resp 15   Ht 5\' 1"  (1.549 m)   Wt 134 lb (60.8 kg)   SpO2 97%   BMI 25.32 kg/m   Physical Exam  Constitutional: She is oriented to person, place, and time. She appears well-developed and well-nourished.  HENT:  Head: Normocephalic and atraumatic.  Right Ear: External ear normal.  Left Ear: External ear normal.  Nose: Nose normal.  Mouth/Throat: Oropharynx is clear and moist.  Eyes: Conjunctivae and EOM are normal. Pupils are equal, round, and reactive to light.  Neck: Normal range of motion. Neck supple.  Cardiovascular: Normal rate, regular rhythm, normal heart sounds and intact distal pulses.   Pulmonary/Chest: Effort normal and breath sounds normal.  Abdominal: Soft. Bowel sounds are normal.  Musculoskeletal:       Right knee: She exhibits swelling and effusion. Tenderness found.  Neurological: She is alert and oriented to person, place, and time.  Skin: Skin is warm.  Skin tear left forearm  Psychiatric: She has a normal mood and affect. Her behavior is normal. Judgment and thought content normal.     ED Treatments / Results  Labs (all labs ordered are listed, but only abnormal results are  displayed) Labs Reviewed - No data to display  EKG  EKG Interpretation None       Radiology Dg Knee Complete 4 Views Right  Result Date: 05/31/2016 CLINICAL DATA:  Fall.  History of a knee replacement in 2009. EXAM: RIGHT KNEE - COMPLETE 4+ VIEW COMPARISON:  None. FINDINGS: No fracture. Knee prosthetic components are well-seated and well-aligned with no evidence of loosening. Bones are  demineralized. No joint effusion.  Soft tissues are unremarkable. IMPRESSION: No fracture or dislocation. No evidence of loosening of the orthopedic hardware. Electronically Signed   By: Lajean Manes M.D.   On: 05/31/2016 11:27   Dg Hip Unilat W Or Wo Pelvis 2-3 Views Right  Result Date: 05/31/2016 CLINICAL DATA:  Fall.  Right hip pain. EXAM: DG HIP (WITH OR WITHOUT PELVIS) 2-3V RIGHT COMPARISON:  None. FINDINGS: No fracture or dislocation. There is concentric hip joint space narrowing on the right with mild superior acetabular subchondral sclerosis. There is a left hip prosthesis with the components well seated and aligned. SI joints and symphysis pubis are normally spaced and aligned. Bones are demineralized. IMPRESSION: 1. No fracture, dislocation or acute finding. 2. Arthropathic changes of the right hip. Electronically Signed   By: Lajean Manes M.D.   On: 05/31/2016 11:30    Procedures Procedures (including critical care time)  Medications Ordered in ED Medications  morphine 4 MG/ML injection 4 mg (4 mg Intravenous Given 05/31/16 1041)  ondansetron (ZOFRAN) injection 4 mg (4 mg Intravenous Given 05/31/16 1041)  morphine 4 MG/ML injection 4 mg (4 mg Intravenous Given 05/31/16 1241)  methylPREDNISolone sodium succinate (SOLU-MEDROL) 125 mg/2 mL injection 125 mg (125 mg Intravenous Given 05/31/16 1240)     Initial Impression / Assessment and Plan / ED Course  I have reviewed the triage vital signs and the nursing notes.  Pertinent labs & imaging results that were available during my care of the  patient were reviewed by me and considered in my medical decision making (see chart for details).  Clinical Course    Pt is feeling much better.  She has a walker at home.  Her husband will stop by the medical supply store to try to get something for the toilet.  The pt has an appt with Dr. Wynelle Link next week.  She is encourage to keep that app.  She knows to return if worse.  Final Clinical Impressions(s) / ED Diagnoses   Final diagnoses:  Sprain of right knee, unspecified ligament, initial encounter  Hip strain, initial encounter    New Prescriptions New Prescriptions   No medications on file     Isla Pence, MD 05/31/16 1331

## 2016-05-31 NOTE — ED Triage Notes (Signed)
Pt. Coming from home after fall this morning in her bathroom. Pt. C/o right knee pain and wanted to get that checked out. Pt. Also sts she scraped her left wrist during the incident as well. Pt. Hx of knee replacement in that knee and a bad hip in that leg, which she is getting surgery on. EDP at bedside.

## 2016-05-31 NOTE — Discharge Instructions (Signed)
Take your tramadol at home for pain.  Ok to alternate with tylenol and ibuprofen.

## 2016-05-31 NOTE — ED Notes (Signed)
EDP at bedside  

## 2016-06-05 DIAGNOSIS — M1611 Unilateral primary osteoarthritis, right hip: Secondary | ICD-10-CM | POA: Diagnosis not present

## 2016-06-05 DIAGNOSIS — M25551 Pain in right hip: Secondary | ICD-10-CM | POA: Diagnosis not present

## 2016-06-05 DIAGNOSIS — M7061 Trochanteric bursitis, right hip: Secondary | ICD-10-CM | POA: Diagnosis not present

## 2016-06-06 ENCOUNTER — Encounter: Payer: Medicare Other | Admitting: Gynecology

## 2016-06-16 ENCOUNTER — Encounter: Payer: Self-pay | Admitting: Gynecology

## 2016-06-16 ENCOUNTER — Encounter: Payer: Medicare Other | Admitting: Gynecology

## 2016-06-16 DIAGNOSIS — Z1231 Encounter for screening mammogram for malignant neoplasm of breast: Secondary | ICD-10-CM | POA: Diagnosis not present

## 2016-07-16 DIAGNOSIS — M1611 Unilateral primary osteoarthritis, right hip: Secondary | ICD-10-CM | POA: Diagnosis not present

## 2016-07-31 ENCOUNTER — Other Ambulatory Visit: Payer: Self-pay | Admitting: Orthopaedic Surgery

## 2016-08-18 ENCOUNTER — Encounter (HOSPITAL_COMMUNITY): Payer: Self-pay

## 2016-08-18 ENCOUNTER — Ambulatory Visit (HOSPITAL_COMMUNITY)
Admission: RE | Admit: 2016-08-18 | Discharge: 2016-08-18 | Disposition: A | Discharge status: Home / Self Care | Payer: Medicare Other | Source: Ambulatory Visit | Attending: Orthopaedic Surgery | Admitting: Orthopaedic Surgery

## 2016-08-18 ENCOUNTER — Encounter (HOSPITAL_COMMUNITY)
Admission: RE | Admit: 2016-08-18 | Discharge: 2016-08-18 | Disposition: A | Discharge status: Home / Self Care | Payer: Medicare Other | Source: Ambulatory Visit | Attending: Orthopaedic Surgery | Admitting: Orthopaedic Surgery

## 2016-08-18 DIAGNOSIS — I4510 Unspecified right bundle-branch block: Secondary | ICD-10-CM | POA: Insufficient documentation

## 2016-08-18 DIAGNOSIS — Z01810 Encounter for preprocedural cardiovascular examination: Secondary | ICD-10-CM | POA: Diagnosis not present

## 2016-08-18 DIAGNOSIS — Z01818 Encounter for other preprocedural examination: Secondary | ICD-10-CM | POA: Insufficient documentation

## 2016-08-18 DIAGNOSIS — M1611 Unilateral primary osteoarthritis, right hip: Secondary | ICD-10-CM | POA: Diagnosis not present

## 2016-08-18 DIAGNOSIS — I10 Essential (primary) hypertension: Secondary | ICD-10-CM | POA: Diagnosis not present

## 2016-08-18 HISTORY — DX: Other specified postprocedural states: R11.2

## 2016-08-18 HISTORY — DX: Sarcoidosis of lung: D86.0

## 2016-08-18 HISTORY — DX: Other complications of anesthesia, initial encounter: T88.59XA

## 2016-08-18 HISTORY — DX: Peripheral vascular disease, unspecified: I73.9

## 2016-08-18 HISTORY — DX: Hypothyroidism, unspecified: E03.9

## 2016-08-18 HISTORY — DX: Adverse effect of unspecified anesthetic, initial encounter: T41.45XA

## 2016-08-18 HISTORY — DX: Gastro-esophageal reflux disease without esophagitis: K21.9

## 2016-08-18 HISTORY — DX: Headache: R51

## 2016-08-18 HISTORY — DX: Headache, unspecified: R51.9

## 2016-08-18 HISTORY — DX: Other specified postprocedural states: Z98.890

## 2016-08-18 LAB — TYPE AND SCREEN
ABO/RH(D): O POS
Antibody Screen: NEGATIVE

## 2016-08-18 LAB — PROTIME-INR
INR: 1.02
Prothrombin Time: 13.4 seconds (ref 11.4–15.2)

## 2016-08-18 LAB — URINALYSIS, ROUTINE W REFLEX MICROSCOPIC
Bilirubin Urine: NEGATIVE
GLUCOSE, UA: NEGATIVE mg/dL
HGB URINE DIPSTICK: NEGATIVE
Ketones, ur: NEGATIVE mg/dL
Leukocytes, UA: NEGATIVE
Nitrite: NEGATIVE
Protein, ur: NEGATIVE mg/dL
SPECIFIC GRAVITY, URINE: 1.003 — AB (ref 1.005–1.030)
pH: 6 (ref 5.0–8.0)

## 2016-08-18 LAB — CBC WITH DIFFERENTIAL/PLATELET
Basophils Absolute: 0 10*3/uL (ref 0.0–0.1)
Basophils Relative: 0 %
EOS PCT: 0 %
Eosinophils Absolute: 0 10*3/uL (ref 0.0–0.7)
HCT: 33.1 % — ABNORMAL LOW (ref 36.0–46.0)
Hemoglobin: 11.2 g/dL — ABNORMAL LOW (ref 12.0–15.0)
LYMPHS PCT: 9 %
Lymphs Abs: 0.5 10*3/uL — ABNORMAL LOW (ref 0.7–4.0)
MCH: 34.1 pg — AB (ref 26.0–34.0)
MCHC: 33.8 g/dL (ref 30.0–36.0)
MCV: 100.9 fL — AB (ref 78.0–100.0)
MONO ABS: 0.3 10*3/uL (ref 0.1–1.0)
Monocytes Relative: 5 %
Neutro Abs: 4.6 10*3/uL (ref 1.7–7.7)
Neutrophils Relative %: 86 %
PLATELETS: 246 10*3/uL (ref 150–400)
RBC: 3.28 MIL/uL — ABNORMAL LOW (ref 3.87–5.11)
RDW: 14.9 % (ref 11.5–15.5)
WBC: 5.5 10*3/uL (ref 4.0–10.5)

## 2016-08-18 LAB — BASIC METABOLIC PANEL
Anion gap: 9 (ref 5–15)
BUN: 17 mg/dL (ref 6–20)
CO2: 23 mmol/L (ref 22–32)
Calcium: 9.5 mg/dL (ref 8.9–10.3)
Chloride: 107 mmol/L (ref 101–111)
Creatinine, Ser: 0.8 mg/dL (ref 0.44–1.00)
GFR calc Af Amer: >60 mL/min (ref >60)
GLUCOSE: 129 mg/dL — AB (ref 65–99)
POTASSIUM: 4.5 mmol/L (ref 3.5–5.1)
Sodium: 139 mmol/L (ref 135–145)

## 2016-08-18 LAB — ABO/RH: ABO/RH(D): O POS

## 2016-08-18 LAB — SURGICAL PCR SCREEN
MRSA, PCR: NEGATIVE
Staphylococcus aureus: NEGATIVE

## 2016-08-18 LAB — APTT: APTT: 28 s (ref 24–36)

## 2016-08-18 NOTE — Pre-Procedure Instructions (Addendum)
[name] Page  08/18/2016      [id number] - [place], [place] - [address] AT [address] [place] [place] [address] Phone: [phone] Fax: [phone]   Your procedure is scheduled on 08/26/16  Report to [facility] Admitting at 730 A.M.  Call this number if you have problems the morning of surgery:  [phone]   Remember:  Do not eat food or drink liquids after midnight.  Take these medicines the morning of surgery with A SIP OF WATER   Prednisone,amlodipine(norvasc),eye drops if needed,levothyroxone(synthroid),lorazepam(ativan) if needed,omeprazole(prilosec)  STOP all herbel meds, nsaids (aleve,naproxen,advil,ibuprofen) 7 days prior to surgery starting tomorrow 08/19/16 including aspirin ,folic acid,,any other vitamins  No methotrexate on Sunday 08/24/16   Do not wear jewelry, make-up or nail polish.  Do not wear lotions, powders, or perfumes, or deoderant.  Do not shave 48 hours prior to surgery.  Men may shave face and neck.  Do not bring valuables to the hospital.  [facility] is not responsible for any belongings or valuables.  Contacts, dentures or bridgework may not be worn into surgery.  Leave your suitcase in the car.  After surgery it may be brought to your room.  For patients admitted to the hospital, discharge time will be determined by your treatment team.  Patients discharged the day of surgery will not be allowed to drive home.   Special instructions:   Special Instructions: [facility] - Preparing for Surgery  Before surgery, you can play an important role.  Because skin is not sterile, your skin needs to be as free of germs as possible.  You can reduce the number of germs on you skin by washing with CHG (chlorahexidine gluconate) soap before surgery.  CHG is an antiseptic cleaner which kills germs and bonds with the skin to continue killing germs even after washing.  Please DO NOT use if you have  an allergy to CHG or antibacterial soaps.  If your skin becomes reddened/irritated stop using the CHG and inform your nurse when you arrive at Short Stay.  Do not shave (including legs and underarms) for at least 48 hours prior to the first CHG shower.  You may shave your face.  Please follow these instructions carefully:   1.  Shower with CHG Soap the night before surgery and the morning of Surgery.  2.  If you choose to wash your hair, wash your hair first as usual with your normal shampoo.  3.  After you shampoo, rinse your hair and body thoroughly to remove the Shampoo.  4.  Use CHG as you would any other liquid soap.  You can apply chg directly  to the skin and wash gently with scrungie or a clean washcloth.  5.  Apply the CHG Soap to your body ONLY FROM THE NECK DOWN.  Do not use on open wounds or open sores.  Avoid contact with your eyes ears, mouth and genitals (private parts).  Wash genitals (private parts)       with your normal soap.  6.  Wash thoroughly, paying special attention to the area where your surgery will be performed.  7.  Thoroughly rinse your body with warm water from the neck down.  8.  DO NOT shower/wash with your normal soap after using and rinsing off the CHG Soap.  9.  Pat yourself dry with a clean towel.            10 .  Wear  clean pajamas.            11.  Place clean sheets on your bed the night of your first shower and do not sleep with pets.  Day of Surgery  Do not apply any lotions/deodorants the morning of surgery.  Please wear clean clothes to the hospital/surgery center.  Please read over the fact sheets that you were given.

## 2016-08-19 NOTE — Progress Notes (Signed)
Anesthesia Chart Review:  Pt is an 83 year old female scheduled for R THA anterior approach on 08/26/2016 with Melrose Nakayama, MD.   - PCP is Burnard Bunting, MD.  - Pulmonologist is Brand Males, MD, who noted pt will be low risk for hip surgery at last office visit 04/22/16.   PMH includes:  HTN, hyperlipidemia, DVT, sarcoidosis (associated with RML lung collapse 2011 and ocular sarcoid), hypothyroidism, post-op N/V, GERD. Never smoker. BMI 26  Medications include: amlodipine, ASA, gemfibrozil, levothyroxine, losartan, methotrexate, prilosec, prednisone  Preoperative labs reviewed.    CXR 08/18/16: No acute abnormalities.  EKG 08/18/16: NSR. RBBB. RBBB is new since 02/18/13. I routed result to PCP for f/u.   If no changes, I anticipate pt can proceed with surgery as scheduled.   Willeen Cass, FNP-BC [facility] Short Stay [facility]/Anesthesiology Phone: [phone] 08/19/2016 3:24 PM

## 2016-08-25 NOTE — H&P (Signed)
TOTAL HIP ADMISSION H&P  Patient is admitted for right total hip arthroplasty.  Subjective:  Chief Complaint: right hip pain  HPI: [name] Page, 83 y.o. female, has a history of pain and functional disability in the right hip(s) due to arthritis and patient has failed non-surgical conservative treatments for greater than 12 weeks to include NSAID's and/or analgesics, flexibility and strengthening excercises, use of assistive devices, weight reduction as appropriate and activity modification.  Onset of symptoms was gradual starting 5 years ago with gradually worsening course since that time.The patient noted no past surgery on the right hip(s).  Patient currently rates pain in the right hip at 10 out of 10 with activity. Patient has night pain, worsening of pain with activity and weight bearing, trendelenberg gait, pain that interfers with activities of daily living and crepitus. Patient has evidence of subchondral cysts, subchondral sclerosis, periarticular osteophytes and joint space narrowing by imaging studies. This condition presents safety issues increasing the risk of falls. There is no current active infection.  Patient Active Problem List   Diagnosis Date Noted  . Pleuritic pain 12/17/2015  . Osteopenia 05/23/2015  . Cystocele 05/23/2015  . Rash and nonspecific skin eruption 01/02/2015  . Visual field defect nasal step 01/02/2015  . Encounter for therapeutic drug monitoring 10/03/2014  . Chronic cough 07/12/2014  . Nasal itching 01/30/2014  . 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 Sarcoidosis ([place]) 01/30/2014  . Cystocele, midline 05/02/2013  . Chest pain   . History of recurrent UTIs 09/24/2011  . Vaginal atrophy 09/24/2011  . HYPERLIPIDEMIA 07/30/2010  . HYPERTENSION 07/30/2010  . DEEP VEIN THROMBOSIS/PHLEBITIS 07/30/2010  . PULMONARY COLLAPSE 07/30/2010  . PULMONARY NODULE 07/30/2010  . COUGH 07/30/2010   Past Medical History:  Diagnosis Date  . Arthritis   .  Cervical spondylosis    C3-4 AND C4-5  . Chest pain 07/28/2008   H/O, normal stress nuclear EF 78%  . Collapsed lung  12/11   being treated at [facility] - in left lung  . Complication of anesthesia   . Dislocation closed, shoulder 7/14  . DVT (deep venous thrombosis) ([place]) 2006  . GERD (gastroesophageal reflux disease)    occ  . Headache    hx  . High cholesterol   . Hypertension   . Hypothyroidism   . Peripheral vascular disease ([place]) 2006   dvt's and 50 yrs ago  . PONV (postoperative nausea and vomiting)   . Sarcoidosis of lung [facility])     Past Surgical History:  Procedure Laterality Date  . EYE SURGERY    . FOOT SURGERY Left 06/2005   bunion hammer toe  . HEMORRHOID SURGERY  12/1972  . INTRAOCULAR LENS INSERTION     right eye 10 /16 and left 06/16/2005  . SKIN BIOPSY  01/2007   basal cell carcinoma removed from right lower leg   . TOTAL HIP ARTHROPLASTY  06/2007   left  . TOTAL KNEE ARTHROPLASTY Right 10/2007   right    No prescriptions prior to admission.   No Known Allergies  Social History  Substance Use Topics  . Smoking status: Never Smoker  . Smokeless tobacco: Never Used  . Alcohol use Yes     Comment: BEER/glass of wine A DAY    Family History  Problem Relation Age of Onset  . Diabetes Mother   . Heart failure Mother   . Hypertension Father   . Diabetes Father   . Heart disease Father   . Heart attack  Father      Review of Systems  Musculoskeletal: Positive for joint pain.       Right hip  All other systems reviewed and are negative.   Objective:  Physical Exam  Constitutional: She is oriented to person, place, and time. She appears well-developed and well-nourished.  HENT:  Head: Normocephalic and atraumatic.  Eyes: Pupils are equal, round, and reactive to light.  Neck: Normal range of motion.  Cardiovascular: Normal rate and regular rhythm.   Respiratory: Effort normal.  GI: Soft.  Musculoskeletal:  Right hip motion is extremely painful.   Her leg lengths look equal.  Her skin is benign.  Straight leg raise is negative.  Sensation and motor function are intact distally with palpable pulses in her feet.  There is no palpable lymphadenopathy about her groin.   Neurological: She is alert and oriented to person, place, and time.  Skin: Skin is warm and dry.    Vital signs in last 24 hours:    Labs:   Estimated body mass index is 26.32 kg/m as calculated from the following:   Height as of 08/18/16: 5' 1.5" (1.562 m).   Weight as of 08/18/16: 64.2 kg (141 lb 9.6 oz).   Imaging Review Plain radiographs demonstrate severe degenerative joint disease of the right hip(s). The bone quality appears to be good for age and reported activity level.  Assessment/Plan:  End stage primary arthritis, right hip(s)  The patient history, physical examination, clinical judgement of the provider and imaging studies are consistent with end stage degenerative joint disease of the right hip(s) and total hip arthroplasty is deemed medically necessary. The treatment options including medical management, injection therapy, arthroscopy and arthroplasty were discussed at length. The risks and benefits of total hip arthroplasty were presented and reviewed. The risks due to aseptic loosening, infection, stiffness, dislocation/subluxation,  thromboembolic complications and other imponderables were discussed.  The patient acknowledged the explanation, agreed to proceed with the plan and consent was signed. Patient is being admitted for inpatient treatment for surgery, pain control, PT, OT, prophylactic antibiotics, VTE prophylaxis, progressive ambulation and ADL's and discharge planning.The patient is planning to be discharged home with home health services

## 2016-08-26 ENCOUNTER — Encounter (HOSPITAL_COMMUNITY): Payer: Self-pay | Admitting: *Deleted

## 2016-08-26 ENCOUNTER — Inpatient Hospital Stay (HOSPITAL_COMMUNITY): Payer: Medicare Other | Admitting: Certified Registered Nurse Anesthetist

## 2016-08-26 ENCOUNTER — Inpatient Hospital Stay (HOSPITAL_COMMUNITY): Payer: Medicare Other

## 2016-08-26 ENCOUNTER — Inpatient Hospital Stay (HOSPITAL_COMMUNITY): Payer: Medicare Other | Admitting: Emergency Medicine

## 2016-08-26 ENCOUNTER — Inpatient Hospital Stay (HOSPITAL_COMMUNITY)
Admission: RE | Admit: 2016-08-26 | Discharge: 2016-08-29 | DRG: 470 | Disposition: A | Discharge status: Home Health | Payer: Medicare Other | Source: Ambulatory Visit | Attending: Orthopaedic Surgery | Admitting: Orthopaedic Surgery

## 2016-08-26 ENCOUNTER — Encounter (HOSPITAL_COMMUNITY)
Admission: RE | Disposition: A | Discharge status: Home Health | Payer: Self-pay | Source: Ambulatory Visit | Attending: Orthopaedic Surgery

## 2016-08-26 DIAGNOSIS — E7800 Pure hypercholesterolemia, unspecified: Secondary | ICD-10-CM | POA: Diagnosis present

## 2016-08-26 DIAGNOSIS — Z96651 Presence of right artificial knee joint: Secondary | ICD-10-CM | POA: Diagnosis present

## 2016-08-26 DIAGNOSIS — E785 Hyperlipidemia, unspecified: Secondary | ICD-10-CM | POA: Diagnosis present

## 2016-08-26 DIAGNOSIS — R42 Dizziness and giddiness: Secondary | ICD-10-CM | POA: Diagnosis not present

## 2016-08-26 DIAGNOSIS — Z96642 Presence of left artificial hip joint: Secondary | ICD-10-CM | POA: Diagnosis present

## 2016-08-26 DIAGNOSIS — R410 Disorientation, unspecified: Secondary | ICD-10-CM | POA: Diagnosis not present

## 2016-08-26 DIAGNOSIS — M8580 Other specified disorders of bone density and structure, unspecified site: Secondary | ICD-10-CM | POA: Diagnosis present

## 2016-08-26 DIAGNOSIS — Z85828 Personal history of other malignant neoplasm of skin: Secondary | ICD-10-CM

## 2016-08-26 DIAGNOSIS — K219 Gastro-esophageal reflux disease without esophagitis: Secondary | ICD-10-CM | POA: Diagnosis present

## 2016-08-26 DIAGNOSIS — D860 Sarcoidosis of lung: Secondary | ICD-10-CM | POA: Diagnosis present

## 2016-08-26 DIAGNOSIS — Z86718 Personal history of other venous thrombosis and embolism: Secondary | ICD-10-CM

## 2016-08-26 DIAGNOSIS — I10 Essential (primary) hypertension: Secondary | ICD-10-CM | POA: Diagnosis present

## 2016-08-26 DIAGNOSIS — T40605A Adverse effect of unspecified narcotics, initial encounter: Secondary | ICD-10-CM | POA: Diagnosis not present

## 2016-08-26 DIAGNOSIS — M1611 Unilateral primary osteoarthritis, right hip: Principal | ICD-10-CM | POA: Diagnosis present

## 2016-08-26 DIAGNOSIS — I739 Peripheral vascular disease, unspecified: Secondary | ICD-10-CM | POA: Diagnosis present

## 2016-08-26 DIAGNOSIS — Z96641 Presence of right artificial hip joint: Secondary | ICD-10-CM | POA: Diagnosis not present

## 2016-08-26 DIAGNOSIS — Z419 Encounter for procedure for purposes other than remedying health state, unspecified: Secondary | ICD-10-CM

## 2016-08-26 DIAGNOSIS — M47812 Spondylosis without myelopathy or radiculopathy, cervical region: Secondary | ICD-10-CM | POA: Diagnosis present

## 2016-08-26 DIAGNOSIS — E039 Hypothyroidism, unspecified: Secondary | ICD-10-CM | POA: Diagnosis present

## 2016-08-26 DIAGNOSIS — Z8249 Family history of ischemic heart disease and other diseases of the circulatory system: Secondary | ICD-10-CM | POA: Diagnosis not present

## 2016-08-26 DIAGNOSIS — Z471 Aftercare following joint replacement surgery: Secondary | ICD-10-CM | POA: Diagnosis not present

## 2016-08-26 HISTORY — DX: Unilateral primary osteoarthritis, right hip: M16.11

## 2016-08-26 HISTORY — PX: TOTAL HIP ARTHROPLASTY: SHX124

## 2016-08-26 SURGERY — ARTHROPLASTY, HIP, TOTAL, ANTERIOR APPROACH
Anesthesia: Monitor Anesthesia Care | Site: Hip | Laterality: Right

## 2016-08-26 MED ORDER — METHOCARBAMOL 500 MG PO TABS: 500.0000 mg | ORAL_TABLET | Freq: Four times a day (QID) | ORAL | Status: DC | PRN

## 2016-08-26 MED ORDER — KETOROLAC TROMETHAMINE 30 MG/ML IJ SOLN
30.0000 mg | Freq: Once | INTRAMUSCULAR | Status: AC
  Administered 2016-08-26: 30 mg via INTRAVENOUS

## 2016-08-26 MED ORDER — METHOCARBAMOL 1000 MG/10ML IJ SOLN
500.0000 mg | Freq: Four times a day (QID) | INTRAVENOUS | Status: DC | PRN
  Filled 2016-08-26: qty 5

## 2016-08-26 MED ORDER — SCOPOLAMINE 1 MG/3DAYS TD PT72
1.0000 | MEDICATED_PATCH | TRANSDERMAL | Status: DC
  Administered 2016-08-26: 1.5 mg via TRANSDERMAL
  Filled 2016-08-26: qty 1

## 2016-08-26 MED ORDER — TRANEXAMIC ACID 1000 MG/10ML IV SOLN
INTRAVENOUS | Status: DC | PRN
  Administered 2016-08-26: 2000 mg via TOPICAL

## 2016-08-26 MED ORDER — HYDROMORPHONE HCL 1 MG/ML IJ SOLN
0.2500 mg | INTRAMUSCULAR | Status: DC | PRN
  Administered 2016-08-26: 0.25 mg via INTRAVENOUS
  Administered 2016-08-26 (×2): 0.5 mg via INTRAVENOUS
  Administered 2016-08-26: 0.25 mg via INTRAVENOUS

## 2016-08-26 MED ORDER — CEFAZOLIN SODIUM-DEXTROSE 2-4 GM/100ML-% IV SOLN
2.0000 g | INTRAVENOUS | Status: AC
  Administered 2016-08-26: 2 g via INTRAVENOUS
  Filled 2016-08-26: qty 100

## 2016-08-26 MED ORDER — HYDROCODONE-ACETAMINOPHEN 5-325 MG PO TABS
1.0000 | ORAL_TABLET | ORAL | Status: DC | PRN
  Administered 2016-08-26: 2 via ORAL
  Administered 2016-08-26: 1 via ORAL
  Administered 2016-08-27: 2 via ORAL
  Administered 2016-08-28: 1 via ORAL
  Filled 2016-08-26: qty 1
  Filled 2016-08-26: qty 2
  Filled 2016-08-26 (×2): qty 1
  Filled 2016-08-26 (×2): qty 2

## 2016-08-26 MED ORDER — 0.9 % SODIUM CHLORIDE (POUR BTL) OPTIME
TOPICAL | Status: DC | PRN
  Administered 2016-08-26: 1000 mL

## 2016-08-26 MED ORDER — FENTANYL CITRATE (PF) 100 MCG/2ML IJ SOLN
INTRAMUSCULAR | Status: DC | PRN
  Administered 2016-08-26 (×2): 50 ug via INTRAVENOUS

## 2016-08-26 MED ORDER — METOCLOPRAMIDE HCL 5 MG/ML IJ SOLN: 5.0000 mg | Freq: Three times a day (TID) | INTRAMUSCULAR | Status: DC | PRN

## 2016-08-26 MED ORDER — APIXABAN 2.5 MG PO TABS
2.5000 mg | ORAL_TABLET | Freq: Two times a day (BID) | ORAL | Status: DC
  Administered 2016-08-27 – 2016-08-29 (×5): 2.5 mg via ORAL
  Filled 2016-08-26 (×5): qty 1

## 2016-08-26 MED ORDER — MENTHOL 3 MG MT LOZG: 1.0000 | LOZENGE | OROMUCOSAL | Status: DC | PRN

## 2016-08-26 MED ORDER — ACETAMINOPHEN 650 MG RE SUPP: 650.0000 mg | Freq: Four times a day (QID) | RECTAL | Status: DC | PRN

## 2016-08-26 MED ORDER — DOCUSATE SODIUM 100 MG PO CAPS
100.0000 mg | ORAL_CAPSULE | Freq: Two times a day (BID) | ORAL | Status: DC
  Administered 2016-08-26 – 2016-08-29 (×6): 100 mg via ORAL
  Filled 2016-08-26 (×6): qty 1

## 2016-08-26 MED ORDER — BUPIVACAINE LIPOSOME 1.3 % IJ SUSP
INTRAMUSCULAR | Status: DC | PRN
  Administered 2016-08-26: 20 mL

## 2016-08-26 MED ORDER — FLUTICASONE PROPIONATE 50 MCG/ACT NA SUSP
2.0000 | Freq: Every day | NASAL | Status: DC
  Administered 2016-08-27 – 2016-08-29 (×3): 2 via NASAL
  Filled 2016-08-26: qty 16

## 2016-08-26 MED ORDER — CEFAZOLIN SODIUM-DEXTROSE 2-4 GM/100ML-% IV SOLN
INTRAVENOUS | Status: AC
  Filled 2016-08-26: qty 100

## 2016-08-26 MED ORDER — DIPHENHYDRAMINE HCL 25 MG PO CAPS
25.0000 mg | ORAL_CAPSULE | Freq: Every day | ORAL | Status: DC
  Administered 2016-08-26 – 2016-08-27 (×2): 25 mg via ORAL
  Filled 2016-08-26 (×3): qty 1

## 2016-08-26 MED ORDER — PREDNISONE 5 MG PO TABS
5.0000 mg | ORAL_TABLET | Freq: Every day | ORAL | Status: DC
  Administered 2016-08-27 – 2016-08-29 (×3): 5 mg via ORAL
  Filled 2016-08-26 (×3): qty 1

## 2016-08-26 MED ORDER — HYDROCODONE-ACETAMINOPHEN 5-325 MG PO TABS
ORAL_TABLET | ORAL | Status: AC
  Filled 2016-08-26: qty 1

## 2016-08-26 MED ORDER — BISACODYL 5 MG PO TBEC
5.0000 mg | DELAYED_RELEASE_TABLET | Freq: Two times a day (BID) | ORAL | Status: DC
  Administered 2016-08-27 – 2016-08-29 (×4): 5 mg via ORAL
  Filled 2016-08-26 (×4): qty 1

## 2016-08-26 MED ORDER — LACTATED RINGERS IV SOLN: INTRAVENOUS | Status: DC

## 2016-08-26 MED ORDER — ACETAMINOPHEN 325 MG PO TABS
650.0000 mg | ORAL_TABLET | Freq: Four times a day (QID) | ORAL | Status: DC | PRN
  Administered 2016-08-27 – 2016-08-28 (×2): 650 mg via ORAL
  Filled 2016-08-26 (×2): qty 2

## 2016-08-26 MED ORDER — BUPIVACAINE LIPOSOME 1.3 % IJ SUSP
20.0000 mL | INTRAMUSCULAR | Status: DC
  Filled 2016-08-26: qty 20

## 2016-08-26 MED ORDER — ALUM & MAG HYDROXIDE-SIMETH 200-200-20 MG/5ML PO SUSP: 30.0000 mL | ORAL | Status: DC | PRN

## 2016-08-26 MED ORDER — LOSARTAN POTASSIUM 50 MG PO TABS
50.0000 mg | ORAL_TABLET | Freq: Every day | ORAL | Status: DC
  Administered 2016-08-27 – 2016-08-28 (×2): 50 mg via ORAL
  Filled 2016-08-26 (×2): qty 1

## 2016-08-26 MED ORDER — BUPIVACAINE-EPINEPHRINE (PF) 0.5% -1:200000 IJ SOLN
INTRAMUSCULAR | Status: AC
  Filled 2016-08-26: qty 30

## 2016-08-26 MED ORDER — PHENYLEPHRINE HCL 10 MG/ML IJ SOLN
INTRAMUSCULAR | Status: DC | PRN
  Administered 2016-08-26: 20 ug/min via INTRAVENOUS

## 2016-08-26 MED ORDER — LACTATED RINGERS IV SOLN
INTRAVENOUS | Status: DC
  Administered 2016-08-26 (×2): via INTRAVENOUS

## 2016-08-26 MED ORDER — AMLODIPINE BESYLATE 10 MG PO TABS
10.0000 mg | ORAL_TABLET | Freq: Every day | ORAL | Status: DC
  Administered 2016-08-27 – 2016-08-28 (×2): 10 mg via ORAL
  Filled 2016-08-26 (×3): qty 1

## 2016-08-26 MED ORDER — PROPOFOL 500 MG/50ML IV EMUL
INTRAVENOUS | Status: DC | PRN
  Administered 2016-08-26: 25 ug/kg/min via INTRAVENOUS

## 2016-08-26 MED ORDER — GEMFIBROZIL 600 MG PO TABS
600.0000 mg | ORAL_TABLET | Freq: Two times a day (BID) | ORAL | Status: DC
  Administered 2016-08-27 – 2016-08-29 (×3): 600 mg via ORAL
  Filled 2016-08-26 (×5): qty 1

## 2016-08-26 MED ORDER — PHENOL 1.4 % MT LIQD: 1.0000 | OROMUCOSAL | Status: DC | PRN

## 2016-08-26 MED ORDER — FENTANYL CITRATE (PF) 100 MCG/2ML IJ SOLN
INTRAMUSCULAR | Status: AC
  Filled 2016-08-26: qty 2

## 2016-08-26 MED ORDER — METOCLOPRAMIDE HCL 5 MG PO TABS: 5.0000 mg | ORAL_TABLET | Freq: Three times a day (TID) | ORAL | Status: DC | PRN

## 2016-08-26 MED ORDER — CEFAZOLIN SODIUM-DEXTROSE 2-4 GM/100ML-% IV SOLN
2.0000 g | Freq: Four times a day (QID) | INTRAVENOUS | Status: AC
  Administered 2016-08-26 (×2): 2 g via INTRAVENOUS
  Filled 2016-08-26: qty 100

## 2016-08-26 MED ORDER — HYDROMORPHONE HCL 2 MG/ML IJ SOLN: 0.5000 mg | INTRAMUSCULAR | Status: DC | PRN

## 2016-08-26 MED ORDER — LEVOTHYROXINE SODIUM 25 MCG PO TABS
25.0000 ug | ORAL_TABLET | Freq: Every day | ORAL | Status: DC
  Administered 2016-08-27 – 2016-08-29 (×3): 25 ug via ORAL
  Filled 2016-08-26 (×3): qty 1

## 2016-08-26 MED ORDER — CHLORHEXIDINE GLUCONATE 4 % EX LIQD: 60.0000 mL | Freq: Once | CUTANEOUS | Status: DC

## 2016-08-26 MED ORDER — MIDAZOLAM HCL 2 MG/2ML IJ SOLN
INTRAMUSCULAR | Status: AC
  Filled 2016-08-26: qty 2

## 2016-08-26 MED ORDER — LORAZEPAM 0.5 MG PO TABS
0.5000 mg | ORAL_TABLET | Freq: Three times a day (TID) | ORAL | Status: DC | PRN
  Administered 2016-08-26 – 2016-08-28 (×2): 0.5 mg via ORAL
  Filled 2016-08-26 (×2): qty 1

## 2016-08-26 MED ORDER — FOLIC ACID 1 MG PO TABS
1.0000 mg | ORAL_TABLET | Freq: Every day | ORAL | Status: DC
  Administered 2016-08-27 – 2016-08-29 (×3): 1 mg via ORAL
  Filled 2016-08-26 (×3): qty 1

## 2016-08-26 MED ORDER — KETOTIFEN FUMARATE 0.025 % OP SOLN
1.0000 [drp] | Freq: Two times a day (BID) | OPHTHALMIC | Status: DC
  Administered 2016-08-26 – 2016-08-29 (×4): 1 [drp] via OPHTHALMIC
  Filled 2016-08-26: qty 5

## 2016-08-26 MED ORDER — TRANEXAMIC ACID 1000 MG/10ML IV SOLN
2000.0000 mg | Freq: Once | INTRAVENOUS | Status: DC
  Filled 2016-08-26: qty 20

## 2016-08-26 MED ORDER — PROPOFOL 10 MG/ML IV BOLUS
INTRAVENOUS | Status: DC | PRN
  Administered 2016-08-26: 20 mg via INTRAVENOUS

## 2016-08-26 MED ORDER — ONDANSETRON HCL 4 MG/2ML IJ SOLN
INTRAMUSCULAR | Status: DC | PRN
  Administered 2016-08-26: 4 mg via INTRAVENOUS

## 2016-08-26 MED ORDER — PANTOPRAZOLE SODIUM 40 MG PO TBEC
80.0000 mg | DELAYED_RELEASE_TABLET | Freq: Every day | ORAL | Status: DC
  Administered 2016-08-27 – 2016-08-28 (×2): 80 mg via ORAL
  Filled 2016-08-26 (×3): qty 2

## 2016-08-26 MED ORDER — HYDROMORPHONE HCL 1 MG/ML IJ SOLN
INTRAMUSCULAR | Status: AC
  Filled 2016-08-26: qty 0.5

## 2016-08-26 MED ORDER — PROMETHAZINE HCL 25 MG/ML IJ SOLN: 6.2500 mg | INTRAMUSCULAR | Status: DC | PRN

## 2016-08-26 MED ORDER — BISACODYL 5 MG PO TBEC: 5.0000 mg | DELAYED_RELEASE_TABLET | Freq: Every day | ORAL | Status: DC | PRN

## 2016-08-26 MED ORDER — DIPHENHYDRAMINE HCL 12.5 MG/5ML PO ELIX: 12.5000 mg | ORAL_SOLUTION | ORAL | Status: DC | PRN

## 2016-08-26 MED ORDER — ONDANSETRON HCL 4 MG PO TABS
4.0000 mg | ORAL_TABLET | Freq: Four times a day (QID) | ORAL | Status: DC | PRN
Start: 2016-08-26 — End: 2016-08-29
  Administered 2016-08-27: 4 mg via ORAL

## 2016-08-26 MED ORDER — BUPIVACAINE-EPINEPHRINE (PF) 0.5% -1:200000 IJ SOLN
INTRAMUSCULAR | Status: DC | PRN
  Administered 2016-08-26: 20 mL

## 2016-08-26 MED ORDER — ONDANSETRON HCL 4 MG/2ML IJ SOLN: 4.0000 mg | Freq: Four times a day (QID) | INTRAMUSCULAR | Status: DC | PRN

## 2016-08-26 MED ORDER — KETOROLAC TROMETHAMINE 30 MG/ML IJ SOLN
INTRAMUSCULAR | Status: AC
  Filled 2016-08-26: qty 1

## 2016-08-26 MED ORDER — MIDAZOLAM HCL 5 MG/5ML IJ SOLN
INTRAMUSCULAR | Status: DC | PRN
  Administered 2016-08-26: 2 mg via INTRAVENOUS

## 2016-08-26 MED ORDER — EPHEDRINE SULFATE 50 MG/ML IJ SOLN
INTRAMUSCULAR | Status: DC | PRN
  Administered 2016-08-26 (×2): 5 mg via INTRAVENOUS

## 2016-08-26 SURGICAL SUPPLY — 47 items
BAG DECANTER FOR FLEXI CONT (MISCELLANEOUS) ×2 IMPLANT
BLADE SAW SGTL 18X1.27X75 (BLADE) ×2 IMPLANT
BLADE SAW SGTL 18X1.27X75MM (BLADE) ×1
CAPT HIP TOTAL 2 ×2 IMPLANT
CELLS DAT CNTRL 66122 CELL SVR (MISCELLANEOUS) ×1 IMPLANT
CLOSURE WOUND 1/2 X4 (GAUZE/BANDAGES/DRESSINGS) ×1
COVER PERINEAL POST (MISCELLANEOUS) ×3 IMPLANT
COVER SURGICAL LIGHT HANDLE (MISCELLANEOUS) ×3 IMPLANT
DRAPE C-ARM 42X72 X-RAY (DRAPES) ×3 IMPLANT
DRAPE STERI IOBAN 125X83 (DRAPES) ×3 IMPLANT
DRAPE U-SHAPE 47X51 STRL (DRAPES) ×9 IMPLANT
DRSG AQUACEL AG ADV 3.5X10 (GAUZE/BANDAGES/DRESSINGS) ×3 IMPLANT
DURAPREP 26ML APPLICATOR (WOUND CARE) ×3 IMPLANT
ELECT BLADE 4.0 EZ CLEAN MEGAD (MISCELLANEOUS)
ELECT REM PT RETURN 9FT ADLT (ELECTROSURGICAL) ×3
ELECTRODE BLDE 4.0 EZ CLN MEGD (MISCELLANEOUS) IMPLANT
ELECTRODE REM PT RTRN 9FT ADLT (ELECTROSURGICAL) ×1 IMPLANT
FACESHIELD WRAPAROUND (MASK) ×6 IMPLANT
FACESHIELD WRAPAROUND OR TEAM (MASK) ×2 IMPLANT
GLOVE BIO SURGEON STRL SZ8 (GLOVE) ×6 IMPLANT
GLOVE BIOGEL PI IND STRL 8 (GLOVE) ×2 IMPLANT
GLOVE BIOGEL PI INDICATOR 8 (GLOVE) ×4
GOWN STRL REUS W/ TWL LRG LVL3 (GOWN DISPOSABLE) ×1 IMPLANT
GOWN STRL REUS W/ TWL XL LVL3 (GOWN DISPOSABLE) ×2 IMPLANT
GOWN STRL REUS W/TWL LRG LVL3 (GOWN DISPOSABLE) ×3
GOWN STRL REUS W/TWL XL LVL3 (GOWN DISPOSABLE) ×6
KIT BASIN OR (CUSTOM PROCEDURE TRAY) ×3 IMPLANT
KIT ROOM TURNOVER OR (KITS) ×3 IMPLANT
MANIFOLD NEPTUNE II (INSTRUMENTS) ×3 IMPLANT
NEEDLE HYPO 22GX1.5 SAFETY (NEEDLE) ×3 IMPLANT
NS IRRIG 1000ML POUR BTL (IV SOLUTION) ×3 IMPLANT
PACK TOTAL JOINT (CUSTOM PROCEDURE TRAY) ×3 IMPLANT
PAD ARMBOARD 7.5X6 YLW CONV (MISCELLANEOUS) ×3 IMPLANT
RETRACTOR WND ALEXIS 18 MED (MISCELLANEOUS) ×1 IMPLANT
RTRCTR WOUND ALEXIS 18CM MED (MISCELLANEOUS) ×3
STRIP CLOSURE SKIN 1/2X4 (GAUZE/BANDAGES/DRESSINGS) ×1 IMPLANT
SUT ETHIBOND NAB CT1 #1 30IN (SUTURE) ×6 IMPLANT
SUT MNCRL AB 3-0 PS2 27 (SUTURE) ×3 IMPLANT
SUT VIC AB 1 CT1 27 (SUTURE) ×3
SUT VIC AB 1 CT1 27XBRD ANBCTR (SUTURE) ×1 IMPLANT
SUT VIC AB 2-0 CT1 27 (SUTURE) ×3
SUT VIC AB 2-0 CT1 TAPERPNT 27 (SUTURE) ×1 IMPLANT
SUT VLOC 180 0 24IN GS25 (SUTURE) ×3 IMPLANT
SYR 50ML LL SCALE MARK (SYRINGE) ×3 IMPLANT
TOWEL OR 17X24 6PK STRL BLUE (TOWEL DISPOSABLE) ×3 IMPLANT
TOWEL OR 17X26 10 PK STRL BLUE (TOWEL DISPOSABLE) ×3 IMPLANT
TRAY CATH 16FR W/PLASTIC CATH (SET/KITS/TRAYS/PACK) IMPLANT

## 2016-08-26 NOTE — Anesthesia Preprocedure Evaluation (Signed)
Anesthesia Evaluation  Patient identified by MRN, date of birth, ID band Patient awake    Reviewed: Allergy & Precautions, NPO status , Patient's Chart, lab work & pertinent test results  History of Anesthesia Complications (+) PONV  Airway Mallampati: II  TM Distance: >3 FB Neck ROM: Full    Dental no notable dental hx. (+) Dental Advisory Given, Caps   Pulmonary neg pulmonary ROS,    Pulmonary exam normal        Cardiovascular hypertension, negative cardio ROS Normal cardiovascular exam     Neuro/Psych  Headaches, negative psych ROS   GI/Hepatic Neg liver ROS, GERD  ,  Endo/Other  Hypothyroidism   Renal/GU negative Renal ROS  negative genitourinary   Musculoskeletal negative musculoskeletal ROS (+)   Abdominal   Peds negative pediatric ROS (+)  Hematology negative hematology ROS (+)   Anesthesia Other Findings   Reproductive/Obstetrics negative OB ROS                             Anesthesia Physical Anesthesia Plan  ASA: III  Anesthesia Plan: Spinal   Post-op Pain Management:    Induction:   Airway Management Planned: Natural Airway and Simple Face Mask  Additional Equipment:   Intra-op Plan:   Post-operative Plan:   Informed Consent: I have reviewed the patients History and Physical, chart, labs and discussed the procedure including the risks, benefits and alternatives for the proposed anesthesia with the patient or authorized representative who has indicated his/her understanding and acceptance.   Dental advisory given  Plan Discussed with: CRNA, Anesthesiologist and Surgeon  Anesthesia Plan Comments:         Anesthesia Quick Evaluation

## 2016-08-26 NOTE — Interval H&P Note (Signed)
History and Physical Interval Note:  08/26/2016 9:14 AM  [name] Page  has presented today for surgery, with the diagnosis of RIGHT HIP DEGENERATIVE JOINT DISEASE  The various methods of treatment have been discussed with the patient and family. After consideration of risks, benefits and other options for treatment, the patient has consented to  Procedure(s): TOTAL HIP ARTHROPLASTY ANTERIOR APPROACH (Right) as a surgical intervention .  The patient's history has been reviewed, patient examined, no change in status, stable for surgery.  I have reviewed the patient's chart and labs.  Questions were answered to the patient's satisfaction.     Treacy Holcomb G

## 2016-08-26 NOTE — Transfer of Care (Signed)
Immediate Anesthesia Transfer of Care Note  Patient: [name] Page  Procedure(s) Performed: Procedure(s): TOTAL HIP ARTHROPLASTY ANTERIOR APPROACH (Right)  Patient Location: PACU  Anesthesia Type:MAC and Spinal  Level of Consciousness: awake, alert , oriented and patient cooperative  Airway & Oxygen Therapy: Patient Spontanous Breathing and Patient connected to nasal cannula oxygen  Post-op Assessment: Report given to RN and Post -op Vital signs reviewed and stable  Post vital signs: Reviewed and stable  Last Vitals:  Vitals:   08/26/16 0801  BP: (!) 152/51  Pulse: 60  Resp: 18  Temp: 36.6 C    Last Pain:  Vitals:   08/26/16 0801  TempSrc: Oral  PainSc:       Patients Stated Pain Goal: 3 (58/30/94 0768)  Complications: No apparent anesthesia complications

## 2016-08-26 NOTE — Anesthesia Postprocedure Evaluation (Addendum)
Anesthesia Post Note  Patient: [name] Page  Procedure(s) Performed: Procedure(s) (LRB): TOTAL HIP ARTHROPLASTY ANTERIOR APPROACH (Right)  Patient location during evaluation: PACU Anesthesia Type: MAC Level of consciousness: awake and alert Pain management: pain level controlled Vital Signs Assessment: post-procedure vital signs reviewed and stable Respiratory status: spontaneous breathing and respiratory function stable Cardiovascular status: blood pressure returned to baseline and stable Postop Assessment: spinal receding Anesthetic complications: no       Last Vitals:  Vitals:   08/26/16 1300 08/26/16 1315  BP: (!) 134/50 (!) 137/46  Pulse: (!) 53 (!) 52  Resp: 15 13  Temp:      Last Pain:  Vitals:   08/26/16 1245  TempSrc:   PainSc: 8                  Krislyn Donnan DANIEL

## 2016-08-26 NOTE — Op Note (Signed)

## 2016-08-27 ENCOUNTER — Encounter (HOSPITAL_COMMUNITY): Payer: Self-pay | Admitting: Orthopaedic Surgery

## 2016-08-27 NOTE — Progress Notes (Signed)
Physical Therapy Treatment Patient Details Name: [name] Page MRN: [id number] DOB: 11-21-1930 Today's Date: 08/27/2016    History of Present Illness Pt is an 83 y/o female s/p R THA, direct anterior approach. PMH including but not limited to HTN, hypothyroidism, L THA in 2008 and R TKA in 2009.    PT Comments    Pt presented supine in bed with HOB level, awake and willing to participate in therapy session. Pt making very slow progress and continues to require physical assistance (mod A) with bed mobility, min guard for transfers and ambulation with RW. Pt only ambulating 5' with RW and min guard secondary to increased pain, fatigue and nausea. Pt would continue to benefit from skilled physical therapy services at this time while admitted and after d/c to address her limitations in order to improve her overall safety and independence with functional mobility.    Follow Up Recommendations  Home health PT;Supervision for mobility/OOB     Equipment Recommendations  None recommended by PT;Other (comment) (pt reported that she has all necessary DME at home)    Recommendations for Other Services       Precautions / Restrictions Precautions Precautions: Fall Precaution Comments: direct anterior approach Restrictions Weight Bearing Restrictions: Yes RLE Weight Bearing: Weight bearing as tolerated    Mobility  Bed Mobility Overal bed mobility: Needs Assistance Bed Mobility: Supine to Sit     Supine to sit: Mod assist     General bed mobility comments: bed flat to simulate home situation. pt required increased time, VC'ing for sequencing and mod A with R LE and trunk to achieve sitting EOB  Transfers Overall transfer level: Needs assistance Equipment used: Rolling walker (2 wheeled) Transfers: Sit to/from Stand Sit to Stand: Min guard         General transfer comment: pt required increased time, VC'ing for bilateral hand placement and min guard for  safety  Ambulation/Gait Ambulation/Gait assistance: Min guard Ambulation Distance (Feet): 5 Feet Assistive device: Rolling walker (2 wheeled) Gait Pattern/deviations: Step-to pattern;Step-through pattern;Decreased step length - right;Decreased step length - left;Decreased stride length;Antalgic;Shuffle;Trunk flexed Gait velocity: decreased Gait velocity interpretation: Below normal speed for age/gender General Gait Details: pt scooting her L foot forwards on floor rather than lifting it off of the floor to advance forwards. Pt very limited in distance ambulated secondary to pain and nausea.   Stairs            Wheelchair Mobility    Modified Rankin (Stroke Patients Only)       Balance Overall balance assessment: Needs assistance Sitting-balance support: Feet supported Sitting balance-Leahy Scale: Fair     Standing balance support: During functional activity;Bilateral upper extremity supported Standing balance-Leahy Scale: Poor Standing balance comment: pt reliant on bilateral UEs on RW                    Cognition Arousal/Alertness: Awake/alert Behavior During Therapy: WFL for tasks assessed/performed Overall Cognitive Status: Within Functional Limits for tasks assessed                      Exercises Total Joint Exercises Quad Sets: AROM;Strengthening;Right;10 reps;Supine Heel Slides: AAROM;Right;10 reps;Supine Hip ABduction/ADduction: AAROM;Right;10 reps;Supine    General Comments        Pertinent Vitals/Pain Pain Assessment: 0-10 Pain Score: 5  Pain Location: R hip Pain Descriptors / Indicators: Sore;Guarding Pain Intervention(s): Monitored during session;Repositioned    Home Living Family/patient expects to be discharged to:: Private residence Living Arrangements:  Spouse/significant other Available Help at Discharge: Family;Available 24 hours/day Type of Home: House Home Access: Stairs to enter Entrance Stairs-Rails: Right Home  Layout: One level Home Equipment: Walker - 2 wheels;Cane - single point;Toilet riser;Shower seat      Prior Function Level of Independence: Independent with assistive device(s)      Comments: pt reported that she ambulated with use of SPC secondary to pain   PT Goals (current goals can now be found in the care plan section) Acute Rehab PT Goals Patient Stated Goal: return home PT Goal Formulation: With patient/family Time For Goal Achievement: 09/10/16 Potential to Achieve Goals: Good Progress towards PT goals: Progressing toward goals    Frequency    7X/week      PT Plan Current plan remains appropriate    Co-evaluation             End of Session Equipment Utilized During Treatment: Gait belt Activity Tolerance: Patient limited by fatigue;Patient limited by pain Patient left: in chair;with call bell/phone within reach;with family/visitor present     Time: 1410-1430 PT Time Calculation (min) (ACUTE ONLY): 20 min  Charges:  $Therapeutic Activity: 8-22 mins                    G CodesClearnce Sorrel Calob Page 23-Sep-2016, 2:45 PM Sherie Don, [place], DPT [phone]

## 2016-08-27 NOTE — Evaluation (Signed)
Occupational Therapy Evaluation Patient Details Name: [name] Page MRN: [id number] DOB: Oct 24, 1930 Today's Date: 08/27/2016    History of Present Illness Pt is an 83 y/o female s/p R THA, direct anterior approach. PMH including but not limited to HTN, hypothyroidism, L THA in 2008 and R TKA in 2009.   Clinical Impression   PTA Pt independent in ADL and mobility. Pt currently mod assist for ADL and min guard for ambulation with RW. Pt with syncopal episode this session after ambulation into the bathroom. Pt stated nausea, sat on BSC over toilet and had episode. RN assisted Pt to recliner, and then back to bed with SCD's re applied. Pt will continue to benefit from OT in the acute care setting prior to dc to venue below. Next session to work on functional transfers, energy conservation education and sink level grooming.    Follow Up Recommendations  No OT follow up;Supervision/Assistance - 24 hour    Equipment Recommendations  None recommended by OT    Recommendations for Other Services PT consult     Precautions / Restrictions Precautions Precautions: Fall Precaution Comments: direct anterior approach Restrictions Weight Bearing Restrictions: Yes RLE Weight Bearing: Weight bearing as tolerated      Mobility Bed Mobility Overal bed mobility: Needs Assistance Bed Mobility: Supine to Sit     Supine to sit: Min assist     General bed mobility comments: min A to assist RLE to EOB  Transfers Overall transfer level: Needs assistance Equipment used: Rolling walker (2 wheeled) Transfers: Sit to/from Stand Sit to Stand: Min assist         General transfer comment: pt required increased time, VC'ing for bilateral hand placement and min A for initial power up    Balance Overall balance assessment: Needs assistance Sitting-balance support: Feet supported Sitting balance-Leahy Scale: Fair     Standing balance support: During functional activity;Bilateral upper  extremity supported Standing balance-Leahy Scale: Poor Standing balance comment: pt reliant on bilateral UEs on RW                            ADL Overall ADL's : Needs assistance/impaired                         Toilet Transfer: Minimal assistance (BSC over comfort height toilet) Toilet Transfer Details (indicate cue type and reason): verbal cues for safe hand placement, BSC over toilet Toileting- Clothing Manipulation and Hygiene: Maximal assistance;Sit to/from stand   Tub/ Shower Transfer: Walk-in shower;Minimal assistance;Ambulation;Rolling walker     General ADL Comments: Evaluation limited by syncopal episode after sitting on the [facility] over toilet     Vision     Perception     Praxis      Pertinent Vitals/Pain Pain Assessment: 0-10 Pain Score: 5  Pain Location: R hip Pain Descriptors / Indicators: Sore;Guarding Pain Intervention(s): Monitored during session;Repositioned     Hand Dominance     Extremity/Trunk Assessment Upper Extremity Assessment Upper Extremity Assessment: Overall WFL for tasks assessed   Lower Extremity Assessment Lower Extremity Assessment: RLE deficits/detail;Defer to PT evaluation RLE Deficits / Details: pt with decreased strength and ROM limitations secondary to post-op. Sensation grossly intact.   Cervical / Trunk Assessment Cervical / Trunk Assessment: Normal   Communication Communication Communication: No difficulties   Cognition Arousal/Alertness: Awake/alert Behavior During Therapy: WFL for tasks assessed/performed Overall Cognitive Status: Within Functional Limits for tasks assessed  General Comments       Exercises      Shoulder Instructions      Home Living Family/patient expects to be discharged to:: Private residence Living Arrangements: Spouse/significant other Available Help at Discharge: Family;Available 24 hours/day Type of Home: House Home Access: Stairs to  enter [employer] of Steps: 4 Entrance Stairs-Rails: Right Home Layout: One level     Bathroom Shower/Tub: Occupational psychologist: Handicapped height     Home Equipment: Environmental consultant - 2 wheels;Cane - single point;Toilet riser;Shower seat          Prior Functioning/Environment Level of Independence: Independent with assistive device(s)        Comments: pt reported that she ambulated with use of SPC secondary to pain        OT Problem List: Decreased strength;Decreased range of motion;Decreased activity tolerance;Impaired balance (sitting and/or standing);Decreased knowledge of use of DME or AE;Pain   OT Treatment/Interventions: Self-care/ADL training;DME and/or AE instruction;Therapeutic activities;Patient/family education;Balance training    OT Goals(Current goals can be found in the care plan section) Acute Rehab OT Goals Patient Stated Goal: return home OT Goal Formulation: With patient Time For Goal Achievement: 09/10/16 Potential to Achieve Goals: Good ADL Goals Pt Will Transfer to Toilet: with supervision;ambulating (comfort height) Pt Will Perform Toileting - Clothing Manipulation and hygiene: with modified independence;sit to/from stand Additional ADL Goal #1: Pt will recall 3 ways to conserve energy during ADL without verbal cues  OT Frequency: Min 3X/week   Barriers to D/C:            Co-evaluation              End of Session Equipment Utilized During Treatment: Gait belt;Rolling walker Nurse Communication: Mobility status;Precautions;Other (comment) (RN present to assist return to bed)  Activity Tolerance: Treatment limited secondary to medical complications (Comment) (syncopal episode) Patient left: in bed;with call bell/phone within reach;with family/visitor present;with SCD's reapplied   Time: [id number] OT Time Calculation (min): 43 min Charges:  OT General Charges $OT Visit: 1 Procedure OT Evaluation $OT Eval Moderate  Complexity: 1 Procedure OT Treatments $Self Care/Home Management : 23-37 mins G-Codes:    Merri Ray Pihu Basil September 22, 2016, 5:11 PM  Hulda Humphrey OTR/L [phone]

## 2016-08-27 NOTE — Discharge Instructions (Addendum)
Information on my medicine - ELIQUIS (apixaban)  This medication education was reviewed with me or my healthcare representative as part of my discharge preparation.   Why was Eliquis prescribed for you? Eliquis was prescribed for you to reduce the risk of blood clots forming after orthopedic surgery.    What do You need to know about Eliquis? Take your Eliquis TWICE DAILY - one tablet in the morning and one tablet in the evening with or without food.  It would be best to take the dose about the same time each day.  If you have difficulty swallowing the tablet whole please discuss with your pharmacist how to take the medication safely.  Take Eliquis exactly as prescribed by your doctor and DO NOT stop taking Eliquis without talking to the doctor who prescribed the medication.  Stopping without other medication to take the place of Eliquis may increase your risk of developing a clot.  After discharge, you should have regular check-up appointments with your healthcare provider that is prescribing your Eliquis.  What do you do if you miss a dose? If a dose of ELIQUIS is not taken at the scheduled time, take it as soon as possible on the same day and twice-daily administration should be resumed.  The dose should not be doubled to make up for a missed dose.  Do not take more than one tablet of ELIQUIS at the same time.  Important Safety Information A possible side effect of Eliquis is bleeding. You should call your healthcare provider right away if you experience any of the following: ? Bleeding from an injury or your nose that does not stop. ? Unusual colored urine (red or dark brown) or unusual colored stools (red or black). ? Unusual bruising for unknown reasons. ? A serious fall or if you hit your head (even if there is no bleeding).  Some medicines may interact with Eliquis and might increase your risk of bleeding or clotting while on Eliquis. To help avoid this, consult your  healthcare provider or pharmacist prior to using any new prescription or non-prescription medications, including herbals, vitamins, non-steroidal anti-inflammatory drugs (NSAIDs) and supplements.  This website has more information on Eliquis (apixaban): http://www.eliquis.com/eliquis/home  INSTRUCTIONS AFTER JOINT REPLACEMENT   Remove items at home which could result in a fall. This includes throw rugs or furniture in walking pathways ICE to the affected joint every three hours while awake for 30 minutes at a time, for at least the first 3-5 days, and then as needed for pain and swelling.  Continue to use ice for pain and swelling. You may notice swelling that will progress down to the foot and ankle.  This is normal after surgery.  Elevate your leg when you are not up walking on it.   Continue to use the breathing machine you got in the hospital (incentive spirometer) which will help keep your temperature down.  It is common for your temperature to cycle up and down following surgery, especially at night when you are not up moving around and exerting yourself.  The breathing machine keeps your lungs expanded and your temperature down.   DIET:  As you were doing prior to hospitalization, we recommend a well-balanced diet.  DRESSING / WOUND CARE / SHOWERING  You may shower 3 days after surgery, but keep the wounds dry during showering.  You may use an occlusive plastic wrap (Press'n Seal for example), NO SOAKING/SUBMERGING IN THE BATHTUB.  If the bandage gets wet, change with a clean  dry gauze.  If the incision gets wet, pat the wound dry with a clean towel.  ACTIVITY  Increase activity slowly as tolerated, but follow the weight bearing instructions below.   No driving for 6 weeks or until further direction given by your physician.  You cannot drive while taking narcotics.  No lifting or carrying greater than 10 lbs. until further directed by your surgeon. Avoid periods of inactivity  such as sitting longer than an hour when not asleep. This helps prevent blood clots.  You may return to work once you are authorized by your doctor.     WEIGHT BEARING   Weight bearing as tolerated with assist device (walker, cane, etc) as directed, use it as long as suggested by your surgeon or therapist, typically at least 4-6 weeks.   EXERCISES  Results after joint replacement surgery are often greatly improved when you follow the exercise, range of motion and muscle strengthening exercises prescribed by your doctor. Safety measures are also important to protect the joint from further injury. Any time any of these exercises cause you to have increased pain or swelling, decrease what you are doing until you are comfortable again and then slowly increase them. If you have problems or questions, call your caregiver or physical therapist for advice.   Rehabilitation is important following a joint replacement. After just a few days of immobilization, the muscles of the leg can become weakened and shrink (atrophy).  These exercises are designed to build up the tone and strength of the thigh and leg muscles and to improve motion. Often times heat used for twenty to thirty minutes before working out will loosen up your tissues and help with improving the range of motion but do not use heat for the first two weeks following surgery (sometimes heat can increase post-operative swelling).   These exercises can be done on a training (exercise) mat, on the floor, on a table or on a bed. Use whatever works the best and is most comfortable for you.    Use music or television while you are exercising so that the exercises are a pleasant break in your day. This will make your life better with the exercises acting as a break in your routine that you can look forward to.   Perform all exercises about fifteen times, three times per day or as directed.  You should exercise both the operative leg and the other  leg as well.   Exercises include:   Quad Sets - Tighten up the muscle on the front of the thigh (Quad) and hold for 5-10 seconds.   Straight Leg Raises - With your knee straight (if you were given a brace, keep it on), lift the leg to 60 degrees, hold for 3 seconds, and slowly lower the leg.  Perform this exercise against resistance later as your leg gets stronger.  Leg Slides: Lying on your back, slowly slide your foot toward your buttocks, bending your knee up off the floor (only go as far as is comfortable). Then slowly slide your foot back down until your leg is flat on the floor again.  Angel Wings: Lying on your back spread your legs to the side as far apart as you can without causing discomfort.  Hamstring Strength:  Lying on your back, push your heel against the floor with your leg straight by tightening up the muscles of your buttocks.  Repeat, but this time bend your knee to a comfortable angle, and push your heel against  the floor.  You may put a pillow under the heel to make it more comfortable if necessary.   A rehabilitation program following joint replacement surgery can speed recovery and prevent re-injury in the future due to weakened muscles. Contact your doctor or a physical therapist for more information on knee rehabilitation.    CONSTIPATION  Constipation is defined medically as fewer than three stools per week and severe constipation as less than one stool per week.  Even if you have a regular bowel pattern at home, your normal regimen is likely to be disrupted due to multiple reasons following surgery.  Combination of anesthesia, postoperative narcotics, change in appetite and fluid intake all can affect your bowels.   YOU MUST use at least one of the following options; they are listed in order of increasing strength to get the job done.  They are all available over the counter, and you may need to use some, POSSIBLY even all of these options:    Drink plenty of  fluids (prune juice may be helpful) and high fiber foods Colace 100 mg by mouth twice a day  Senokot for constipation as directed and as needed Dulcolax (bisacodyl), take with full glass of water  Miralax (polyethylene glycol) once or twice a day as needed.  If you have tried all these things and are unable to have a bowel movement in the first 3-4 days after surgery call either your surgeon or your primary doctor.    If you experience loose stools or diarrhea, hold the medications until you stool forms back up.  If your symptoms do not get better within 1 week or if they get worse, check with your doctor.  If you experience "the worst abdominal pain ever" or develop nausea or vomiting, please contact the office immediately for further recommendations for treatment.   ITCHING:  If you experience itching with your medications, try taking only a single pain pill, or even half a pain pill at a time.  You can also use Benadryl over the counter for itching or also to help with sleep.   TED HOSE STOCKINGS:  Use stockings on both legs until for at least 2 weeks or as directed by physician office. They may be removed at night for sleeping.  MEDICATIONS:  See your medication summary on the "After Visit Summary" that nursing will review with you.  You may have some home medications which will be placed on hold until you complete the course of blood thinner medication.  It is important for you to complete the blood thinner medication as prescribed.  PRECAUTIONS:  If you experience chest pain or shortness of breath - call 911 immediately for transfer to the hospital emergency department.   If you develop a fever greater that 101 F, purulent drainage from wound, increased redness or drainage from wound, foul odor from the wound/dressing, or calf pain - CONTACT YOUR SURGEON.                                                   FOLLOW-UP APPOINTMENTS:  If you do not already have a post-op appointment,  please call the office for an appointment to be seen by your surgeon.  Guidelines for how soon to be seen are listed in your "After Visit Summary", but are typically between 1-4 weeks after surgery.  OTHER  INSTRUCTIONS:   Knee Replacement:  Do not place pillow under knee, focus on keeping the knee straight while resting. CPM instructions: 0-90 degrees, 2 hours in the morning, 2 hours in the afternoon, and 2 hours in the evening. Place foam block, curve side up under heel at all times except when in CPM or when walking.  DO NOT modify, tear, cut, or change the foam block in any way.  MAKE SURE YOU:  Understand these instructions.  Get help right away if you are not doing well or get worse.    Thank you for letting us be a part of your medical care team.  It is a privilege we respect greatly.  We hope these instructions will help you stay on track for a fast and full recovery!

## 2016-08-27 NOTE — Evaluation (Signed)
Physical Therapy Evaluation Patient Details Name: [name] Page MRN: [id number] DOB: 1931/02/08 Today's Date: 08/27/2016   History of Present Illness  Pt is an 83 y/o female s/p R THA, direct anterior approach. PMH including but not limited to HTN, hypothyroidism, L THA in 2008 and R TKA in 2009.  Clinical Impression  Pt presented supine in bed with HOB elevated, awake and willing to participate in therapy session. Prior to admission, pt reported that she was mod I with functional mobility, using a SPC to ambulate. Pt currently requires min A with bed mobility and transfers. Plan for further gait progression at next session. Pt would continue to benefit from skilled physical therapy services at this time while admitted and after d/c to address her below listed limitations in order to improve her overall safety and independence with functional mobility.    Follow Up Recommendations Home health PT;Supervision for mobility/OOB    Equipment Recommendations  None recommended by PT;Other (comment) (pt reported that she has all necessary DME at home)    Recommendations for Other Services       Precautions / Restrictions Precautions Precautions: Fall Restrictions Weight Bearing Restrictions: Yes RLE Weight Bearing: Weight bearing as tolerated      Mobility  Bed Mobility Overal bed mobility: Needs Assistance Bed Mobility: Supine to Sit     Supine to sit: Min assist     General bed mobility comments: pt required increased time, use of bed rails, VC'ing for sequencing and min A with R LE movement  Transfers Overall transfer level: Needs assistance Equipment used: Rolling walker (2 wheeled) Transfers: Sit to/from [facility] Sit to Stand: Min assist Stand pivot transfers: Min assist       General transfer comment: pt required increased time, VC'ing for bilateral hand placement, min A for stability with standing from bed and min A with pivotal movements to  recliner.  Ambulation/Gait                Stairs            Wheelchair Mobility    Modified Rankin (Stroke Patients Only)       Balance Overall balance assessment: Needs assistance Sitting-balance support: Feet supported Sitting balance-Leahy Scale: Fair     Standing balance support: During functional activity;Bilateral upper extremity supported Standing balance-Leahy Scale: Poor Standing balance comment: pt reliant on bilateral UEs on RW                             Pertinent Vitals/Pain Pain Assessment: 0-10 Pain Score: 7  Pain Location: R hip Pain Descriptors / Indicators: Sore;Guarding Pain Intervention(s): Monitored during session;Repositioned    Home Living Family/patient expects to be discharged to:: Private residence Living Arrangements: Spouse/significant other Available Help at Discharge: Family;Available 24 hours/day Type of Home: House Home Access: Stairs to enter Entrance Stairs-Rails: Right Entrance Stairs-Number of Steps: 4 Home Layout: One level Home Equipment: Walker - 2 wheels;Cane - single point;Toilet riser;Shower seat      Prior Function Level of Independence: Independent with assistive device(s)         Comments: pt reported that she ambulated with use of SPC secondary to pain     Hand Dominance        Extremity/Trunk Assessment   Upper Extremity Assessment Upper Extremity Assessment: Overall WFL for tasks assessed    Lower Extremity Assessment Lower Extremity Assessment: RLE deficits/detail RLE Deficits / Details: pt with decreased strength  and ROM limitations secondary to post-op. Sensation grossly intact.    Cervical / Trunk Assessment Cervical / Trunk Assessment: Normal  Communication   Communication: No difficulties  Cognition Arousal/Alertness: Awake/alert Behavior During Therapy: WFL for tasks assessed/performed Overall Cognitive Status: Within Functional Limits for tasks assessed                       General Comments      Exercises     Assessment/Plan    PT Assessment Patient needs continued PT services  PT Problem List Decreased strength;Decreased range of motion;Decreased activity tolerance;Decreased balance;Decreased mobility;Decreased coordination;Decreased knowledge of use of DME;Decreased safety awareness;Pain          PT Treatment Interventions DME instruction;Gait training;Stair training;Functional mobility training;Therapeutic activities;Therapeutic exercise;Neuromuscular re-education;Balance training;Patient/family education    PT Goals (Current goals can be found in the Care Plan section)  Acute Rehab PT Goals Patient Stated Goal: return home PT Goal Formulation: With patient/family Time For Goal Achievement: 09/10/16 Potential to Achieve Goals: Good    Frequency 7X/week   Barriers to discharge        Co-evaluation               End of Session Equipment Utilized During Treatment: Gait belt Activity Tolerance: Patient limited by fatigue;Patient limited by pain Patient left: in chair;with call bell/phone within reach;with family/visitor present Nurse Communication: Mobility status         Time: [id number] PT Time Calculation (min) (ACUTE ONLY): 20 min   Charges:   PT Evaluation $PT Eval Moderate Complexity: 1 Procedure     PT G CodesClearnce Sorrel Jazia Faraci 08/27/2016, 10:27 AM Sherie Don, [place], DPT [phone]

## 2016-08-27 NOTE — Progress Notes (Signed)
Subjective: 1 Day Post-Op Procedure(s) (LRB): TOTAL HIP ARTHROPLASTY ANTERIOR APPROACH (Right)  Activity level:  wbat Diet tolerance:  ok Voiding:  ok Patient reports pain as mild.    Objective: Vital signs in last 24 hours: Temp:  [97.3 F (36.3 C)-100.2 F (37.9 C)] 98.8 F (37.1 C) (01/17 0544) Pulse Rate:  [50-75] 64 (01/17 0544) Resp:  [10-24] 14 (01/17 0544) BP: (105-162)/(40-98) 140/47 (01/17 0544) SpO2:  [96 %-100 %] 99 % (01/17 0544)  Labs: No results for input(s): HGB in the last 72 hours. No results for input(s): WBC, RBC, HCT, PLT in the last 72 hours. No results for input(s): NA, K, CL, CO2, BUN, CREATININE, GLUCOSE, CALCIUM in the last 72 hours. No results for input(s): LABPT, INR in the last 72 hours.  Physical Exam:  Neurologically intact ABD soft Neurovascular intact Sensation intact distally Intact pulses distally Dorsiflexion/Plantar flexion intact Incision: dressing C/D/I and no drainage No cellulitis present Compartment soft  Assessment/Plan:  1 Day Post-Op Procedure(s) (LRB): TOTAL HIP ARTHROPLASTY ANTERIOR APPROACH (Right) Advance diet Up with therapy Plan for discharge tomorrow Discharge home with home health if doing well and cleared by PT. Follow up in office 2 weeks post op. Continue on Eliquis BID for DVT prevention x 4 weeks.  Lynzy Rawles, Larwance Sachs 08/27/2016, 8:14 AM

## 2016-08-28 LAB — BASIC METABOLIC PANEL
Anion gap: 5 (ref 5–15)
BUN: 6 mg/dL (ref 6–20)
CO2: 27 mmol/L (ref 22–32)
CREATININE: 0.53 mg/dL (ref 0.44–1.00)
Calcium: 8.6 mg/dL — ABNORMAL LOW (ref 8.9–10.3)
Chloride: 103 mmol/L (ref 101–111)
GFR calc Af Amer: >60 mL/min (ref >60)
Glucose, Bld: 117 mg/dL — ABNORMAL HIGH (ref 65–99)
POTASSIUM: 4.4 mmol/L (ref 3.5–5.1)
SODIUM: 135 mmol/L (ref 135–145)

## 2016-08-28 LAB — CBC
HEMATOCRIT: 27.4 % — AB (ref 36.0–46.0)
Hemoglobin: 9.5 g/dL — ABNORMAL LOW (ref 12.0–15.0)
MCH: 34.9 pg — ABNORMAL HIGH (ref 26.0–34.0)
MCHC: 34.7 g/dL (ref 30.0–36.0)
MCV: 100.7 fL — ABNORMAL HIGH (ref 78.0–100.0)
PLATELETS: 188 10*3/uL (ref 150–400)
RBC: 2.72 MIL/uL — ABNORMAL LOW (ref 3.87–5.11)
RDW: 14.5 % (ref 11.5–15.5)
WBC: 6.4 10*3/uL (ref 4.0–10.5)

## 2016-08-28 MED ORDER — SODIUM CHLORIDE 0.9 % IV BOLUS (SEPSIS)
500.0000 mL | Freq: Once | INTRAVENOUS | Status: AC
  Administered 2016-08-28: 500 mL via INTRAVENOUS

## 2016-08-28 NOTE — Progress Notes (Signed)
Subjective: 2 Days Post-Op Procedure(s) (LRB): TOTAL HIP ARTHROPLASTY ANTERIOR APPROACH (Right)   Patient resting comfortably in bed with husband bedside. She is having a little problem with her BP and a low diastolic number. She got a little light headed yesterday when up.  Activity level:  wbat Diet tolerance:  ok Voiding:  ok Patient reports pain as mild.    Objective: Vital signs in last 24 hours: Temp:  [98.1 F (36.7 C)-98.9 F (37.2 C)] 98.9 F (37.2 C) (01/18 0703) Pulse Rate:  [64-78] 64 (01/18 0703) Resp:  [17-18] 17 (01/18 0703) BP: (136-150)/(50-57) 136/54 (01/18 0703) SpO2:  [98 %-99 %] 99 % (01/18 0703)  Labs:  Recent Labs  08/28/16 0525  HGB 9.5*    Recent Labs  08/28/16 0525  WBC 6.4  RBC 2.72*  HCT 27.4*  PLT 188    Recent Labs  08/28/16 0525  NA 135  K 4.4  CL 103  CO2 27  BUN 6  CREATININE 0.53  GLUCOSE 117*  CALCIUM 8.6*   No results for input(s): LABPT, INR in the last 72 hours.  Physical Exam:  Neurologically intact ABD soft Neurovascular intact Sensation intact distally Intact pulses distally Dorsiflexion/Plantar flexion intact Incision: dressing C/D/I and no drainage No cellulitis present Compartment soft  Assessment/Plan:  2 Days Post-Op Procedure(s) (LRB): TOTAL HIP ARTHROPLASTY ANTERIOR APPROACH (Right) Advance diet Up with therapy Discharge home with home health if doing well and cleared by PT. We will give a 500cc bolus to see if this helps with patient BP. She will continue on Eliquis for DVT prevention.  Follow up in office 2 weeks post op.  [name] Page, Larwance Sachs 08/28/2016, 12:46 PM

## 2016-08-28 NOTE — Progress Notes (Addendum)
RN entered room to complete night time med pass. Patient informed RN of some confusion experienced earlier in which the patient confused a trash can for a commode and proceeded to get up alone and urinate in. RN educated/reinforced the importance of calling and requesting assistance for all voiding needs. Patient expressed understanding. Bed alarm placed. Call bell within reach. Narcotics withheld at this time however patient resting comfortably in bed. Patient is also alert and oriented x4 at this time. Nursing will continue to monitor.

## 2016-08-28 NOTE — Progress Notes (Signed)
Physical Therapy Treatment Patient Details Name: [name] Page MRN: [id number] DOB: 10/07/1930 Today's Date: 08/28/2016    History of Present Illness Pt is an 83 y/o female s/p R THA, direct anterior approach. PMH including but not limited to HTN, hypothyroidism, L THA in 2008 and R TKA in 2009.    PT Comments    Patient continues to be orthostatic. She was receiving a bolus during treatment. Her symptoms were decreasing. She will require stair training when able. She was asymptomatic this afternoon despite drop in B/P. Hopefully with the Bolus she will be better in the morning. She is very motivated. She was able to walk to the bathroom and back. She had pain with ambulation but it improved as she walked more. B/P as follows Supine : Initial 140/50 Seated: 124/54 Standing 90/41   Follow Up Recommendations  Home health PT;Supervision for mobility/OOB     Equipment Recommendations  None recommended by PT;Other (comment) (pt reported that she has all necessary DME at home)    Recommendations for Other Services       Precautions / Restrictions Precautions Precautions: Fall Precaution Comments: direct anterior, watch blood pressure Restrictions Weight Bearing Restrictions: Yes RLE Weight Bearing: Weight bearing as tolerated    Mobility  Bed Mobility Overal bed mobility: Needs Assistance Bed Mobility: Supine to Sit;Sit to Supine     Supine to sit: Min assist Sit to supine: Min assist   General bed mobility comments: Min A for RLE in and out of bed  Transfers Overall transfer level: Needs assistance Equipment used: Rolling walker (2 wheeled) Transfers: Sit to/from Stand Sit to Stand: Min assist         General transfer comment: Sit to stand at the edge of the bed 2x and from the commode 1x. Her B/P dropped significantly but the patient was aysmptomatic. The patient requeested to try to go to the bathroom. A chair was near in case it was needed. Cuing required for  hand placement when sitting and standing.   Ambulation/Gait Ambulation/Gait assistance: Min guard Ambulation Distance (Feet): 10 Feet (x2 ambulated to the bathroom and back to a chair)         General Gait Details: Patient was able to walk to the bathroom. She reported no syncope. She put limited weight on the right lower extremity. She required cuing for heel striike and hip flexion.    Stairs            Wheelchair Mobility    Modified Rankin (Stroke Patients Only)       Balance Overall balance assessment: Needs assistance Sitting-balance support: Bilateral upper extremity supported;Feet supported Sitting balance-Leahy Scale: Fair Sitting balance - Comments: able to sit EOB with no back supporrt   Standing balance support: During functional activity;Bilateral upper extremity supported Standing balance-Leahy Scale: Poor Standing balance comment: pt reliant on bilateral UEs on RW                    Cognition Arousal/Alertness: Awake/alert Behavior During Therapy: WFL for tasks assessed/performed Overall Cognitive Status: Within Functional Limits for tasks assessed                      Exercises      General Comments        Pertinent Vitals/Pain Pain Assessment: Faces Faces Pain Scale: Hurts even more Pain Location: R hip Pain Descriptors / Indicators: Sore;Guarding Pain Intervention(s): Limited activity within patient's tolerance;Monitored during session;Premedicated before session;Repositioned;Utilized relaxation techniques  Home Living                      Prior Function            PT Goals (current goals can now be found in the care plan section) Acute Rehab PT Goals Patient Stated Goal: return home PT Goal Formulation: With patient/family Time For Goal Achievement: 09/10/16 Potential to Achieve Goals: Good Progress towards PT goals: Progressing toward goals    Frequency    7X/week      PT Plan Current plan  remains appropriate    Co-evaluation             End of Session Equipment Utilized During Treatment: Gait belt Activity Tolerance: Patient limited by fatigue;Patient limited by pain Patient left: in chair;with call bell/phone within reach;with family/visitor present     Time: 1420-1443 PT Time Calculation (min) (ACUTE ONLY): 23 min  Charges:  $Gait Training: 8-22 mins $Therapeutic Activity: 8-22 mins                    G Codes:      Carney Living PT DPT  08/28/2016, 4:12 PM

## 2016-08-28 NOTE — Progress Notes (Signed)
Occupational Therapy Treatment Patient Details Name: [name] Page MRN: [id number] DOB: [birth year] Today's Date: 08/28/2016    History of present illness Pt is an 83 y/o female s/p R THA, direct anterior approach. PMH including but not limited to HTN, hypothyroidism, L THA in 2008 and R TKA in 2009.   OT comments  Pt making slow progress towards OT goals this session. Orthostatics taken this session due to syncopal episode yesterday. Initial Supine - 131/45 Sitting EOB - 113/49 Standing Pt feeling weak, and cuff unable to read x2 Taken sitting after standing up - 101/43  Pt able to perform seating grooming tasks EOB this session, mobility limited by BP this session. OT will continue to follow.   Follow Up Recommendations  No OT follow up;Supervision/Assistance - 24 hour    Equipment Recommendations  None recommended by OT    Recommendations for Other Services      Precautions / Restrictions Precautions Precautions: Fall Precaution Comments: direct anterior, watch blood pressure Restrictions Weight Bearing Restrictions: Yes RLE Weight Bearing: Weight bearing as tolerated       Mobility Bed Mobility Overal bed mobility: Needs Assistance Bed Mobility: Supine to Sit;Sit to Supine     Supine to sit: Min assist Sit to supine: Min assist   General bed mobility comments: Min A fro RLE in and out of bed  Transfers Overall transfer level: Needs assistance Equipment used: Rolling walker (2 wheeled) Transfers: Sit to/from Stand Sit to Stand: Min assist         General transfer comment: pt required increased time, VC'ing for bilateral hand placement and min A for initial power up    Balance Overall balance assessment: Needs assistance Sitting-balance support: Bilateral upper extremity supported;Feet supported Sitting balance-Leahy Scale: Fair Sitting balance - Comments: able to sit EOB with no back supporrt   Standing balance support: During functional  activity;Bilateral upper extremity supported Standing balance-Leahy Scale: Poor Standing balance comment: pt reliant on bilateral UEs on RW                   ADL Overall ADL's : Needs assistance/impaired     Grooming: Wash/dry hands;Wash/dry face;Oral care;Set up;Sitting Grooming Details (indicate cue type and reason): sitting EOB with no back support                             Functional mobility during ADLs:  (not attempted at this time due to blood pressure) General ADL Comments: Orthostatics taken during session, limiting session      Vision                     Perception     Praxis      Cognition   Behavior During Therapy: WFL for tasks assessed/performed Overall Cognitive Status: Within Functional Limits for tasks assessed                       Extremity/Trunk Assessment               Exercises     Shoulder Instructions       General Comments      Pertinent Vitals/ Pain       Pain Assessment: Faces Pain Score: 1  Faces Pain Scale: Hurts little more Pain Location: R hip Pain Descriptors / Indicators: Sore;Guarding Pain Intervention(s): Limited activity within patient's tolerance;Repositioned;Monitored during session  Home Living  Prior Functioning/Environment              Frequency  Min 3X/week        Progress Toward Goals  OT Goals(current goals can now be found in the care plan section)  Progress towards OT goals: Progressing toward goals  Acute Rehab OT Goals Patient Stated Goal: return home OT Goal Formulation: With patient Time For Goal Achievement: 09/10/16 Potential to Achieve Goals: Good  Plan Discharge plan remains appropriate    Co-evaluation    PT/OT/SLP Co-Evaluation/Treatment: Yes Reason for Co-Treatment: For patient/therapist safety;To address functional/ADL transfers PT goals addressed during session: Mobility/safety  with mobility OT goals addressed during session: ADL's and self-care      End of Session Equipment Utilized During Treatment: Gait belt;Rolling walker   Activity Tolerance Other (comment) (hydrostatics limiting session)   Patient Left in bed;with call bell/phone within reach;with family/visitor present;Other (comment) (with PT doing exercises)   Nurse Communication Other (comment);Weight bearing status (Blood pressure update)        Time: [id number] OT Time Calculation (min): 25 min  Charges: OT General Charges $OT Visit: 1 Procedure OT Treatments $Self Care/Home Management : 8-22 mins  [name] Page 08/28/2016, 11:53 AM Hulda Humphrey OTR/L [phone]

## 2016-08-28 NOTE — Progress Notes (Addendum)
Physical Therapy Treatment Patient Details Name: [name] Page MRN: [id number] DOB: [birth year] Today's Date: 08/28/2016    History of Present Illness Pt is an 83 y/o female s/p R THA, direct anterior approach. PMH including but not limited to HTN, hypothyroidism, L THA in 2008 and R TKA in 2009.    PT Comments    Patient demsotrated improved ability to stand today and had less pain  But she remains syncopal with transfers. Orthostatics are as follows. She will be seen again in the afternoon and is very motivated to walk.  Initial  Supine - 131/45 Sitting EOB - 113/49 Standing Pt feeling weak, and cuff unable to read x2 Taken sitting after standing up - 101/43  Follow Up Recommendations  Home health PT;Supervision for mobility/OOB     Equipment Recommendations  None recommended by PT;Other (comment) (pt reported that she has all necessary DME at home)    Recommendations for Other Services       Precautions / Restrictions Precautions Precautions: Fall Precaution Comments: direct anterior, watch blood pressure Restrictions Weight Bearing Restrictions: Yes RLE Weight Bearing: Weight bearing as tolerated    Mobility  Bed Mobility Overal bed mobility: Needs Assistance Bed Mobility: Supine to Sit;Sit to Supine     Supine to sit: Min assist Sit to supine: Min assist   General bed mobility comments: Min A for RLE in and out of bed  Transfers Overall transfer level: Needs assistance Equipment used: Rolling walker (2 wheeled) Transfers: Sit to/from Stand Sit to Stand: Min assist         General transfer comment: pt required increased time, VC'ing for bilateral hand placement and min A for initial power up. Sait to stand 5x with PT. The patient felt syncopal with each trial. Her otrhtositaics were taken. See asseessment section of the note. She did not walk for safety. Toni Arthurs she walked and had a significant syncopal episode.    Ambulation/Gait                  Stairs            Wheelchair Mobility    Modified Rankin (Stroke Patients Only)       Balance Overall balance assessment: Needs assistance Sitting-balance support: Bilateral upper extremity supported;Feet supported Sitting balance-Leahy Scale: Fair Sitting balance - Comments: able to sit EOB with no back supporrt   Standing balance support: During functional activity;Bilateral upper extremity supported Standing balance-Leahy Scale: Poor Standing balance comment: pt reliant on bilateral UEs on RW                    Cognition Arousal/Alertness: Awake/alert Behavior During Therapy: WFL for tasks assessed/performed Overall Cognitive Status: Within Functional Limits for tasks assessed                      Exercises Total Joint Exercises Quad Sets: AROM;Strengthening;Right;10 reps;Supine Heel Slides: AAROM;Right;10 reps;Supine (2 sets ) Hip ABduction/ADduction: AAROM;Right;10 reps;Supine (2 sets ) Long Arc Quad:  (2x10)    General Comments General comments (skin integrity, edema, etc.): Pt's husband present       Pertinent Vitals/Pain Pain Assessment: Faces Pain Score: 1  Faces Pain Scale: Hurts little more Pain Location: R hip Pain Descriptors / Indicators: Sore;Guarding Pain Intervention(s): Limited activity within patient's tolerance;Monitored during session;Premedicated before session;Repositioned;Utilized relaxation techniques    Home Living  Prior Function            PT Goals (current goals can now be found in the care plan section) Acute Rehab PT Goals Patient Stated Goal: return home PT Goal Formulation: With patient/family Time For Goal Achievement: 09/10/16 Potential to Achieve Goals: Good Progress towards PT goals: Progressing toward goals    Frequency    7X/week      PT Plan Current plan remains appropriate    Co-evaluation   Reason for Co-Treatment: Complexity of the patient's  impairments (multi-system involvement);For patient/therapist safety PT goals addressed during session: Mobility/safety with mobility;Balance;Strengthening/ROM OT goals addressed during session: ADL's and self-care     End of Session Equipment Utilized During Treatment: Gait belt Activity Tolerance: Patient limited by fatigue;Patient limited by pain Patient left: in chair;with call bell/phone within reach;with family/visitor present     Time: 0950-1030 PT Time Calculation (min) (ACUTE ONLY): 40 min  Charges:  $Gait Training: 8-22 mins $Therapeutic Activity: 8-22 mins                    G Codes:      Carney Living PT DPT  08/28/2016, 12:38 PM

## 2016-08-29 ENCOUNTER — Encounter (HOSPITAL_COMMUNITY): Payer: Self-pay | Admitting: General Practice

## 2016-08-29 MED ORDER — APIXABAN 2.5 MG PO TABS: 2.5000 mg | ORAL_TABLET | Freq: Two times a day (BID) | ORAL | 0 refills | Status: DC

## 2016-08-29 MED ORDER — SODIUM CHLORIDE 0.9 % IV BOLUS (SEPSIS)
500.0000 mL | Freq: Once | INTRAVENOUS | Status: AC
  Administered 2016-08-29: 500 mL via INTRAVENOUS

## 2016-08-29 NOTE — Progress Notes (Signed)
Physical Therapy Treatment Patient Details Name: [name] Page MRN: [id number] DOB: [birth year] Today's Date: 08/29/2016    History of Present Illness Pt is an 83 y/o female s/p R THA, direct anterior approach. PMH including but not limited to HTN, hypothyroidism, L THA in 2008 and R TKA in 2009.    PT Comments    Pt and husband educated on stair training simulating home entry.  Pt's husband provided assist and they were safe together.  She had no reports of lightheadedness while up and moving.  From a mobility standpoint, pt is safe to d/c home with her husband's assist.  PT to follow acutely until d/c confirmed.     Follow Up Recommendations  Home health PT;Supervision for mobility/OOB     Equipment Recommendations  None recommended by PT    Recommendations for Other Services    NA    Precautions / Restrictions Precautions Precautions: Fall Precaution Comments: direct anterior, watch blood pressure Restrictions RLE Weight Bearing: Weight bearing as tolerated    Mobility  Bed Mobility Overal bed mobility: Needs Assistance Bed Mobility: Supine to Sit;Sit to Supine     Supine to sit: Min assist     General bed mobility comments: Min assist to help progress right leg to EOB.  HOB flat no rail and pt able to work her way up on her elbows and get up to sitting without hand held assist.   Transfers Overall transfer level: Needs assistance Equipment used: Rolling walker (2 wheeled) Transfers: Sit to/from Stand Sit to Stand: Min guard         General transfer comment: Min guard assist for safety.  Verbal cues for safe hand placement.  Pt's husband providing correct safe cues to pt re: transitions and hand placement.    Ambulation/Gait Ambulation/Gait assistance: Min guard Ambulation Distance (Feet): 45 Feet Assistive device: Rolling walker (2 wheeled) Gait Pattern/deviations: Step-to pattern;Antalgic Gait velocity: decreased Gait velocity interpretation: Below  normal speed for age/gender General Gait Details: Pt with moderately antalgic gait pattern, verbal cues for correct LE sequencing and upright posture.     Stairs Stairs: Yes   Stair Management: One rail Left;Step to pattern;Forwards (hand held assist on the right) Number of Stairs: 5 General stair comments: Verbal cues for safe LE sequencing, husband providing hand held assist on the side that she is abscent a railing and pt and husband showed safe technique simulating home entry and exit.           Balance Overall balance assessment: Needs assistance Sitting-balance support: Feet supported;No upper extremity supported Sitting balance-Leahy Scale: Fair     Standing balance support: Bilateral upper extremity supported Standing balance-Leahy Scale: Poor                      Cognition Arousal/Alertness: Awake/alert Behavior During Therapy: WFL for tasks assessed/performed Overall Cognitive Status: Within Functional Limits for tasks assessed                             Pertinent Vitals/Pain Pain Assessment: 0-10 Pain Score: 4  Pain Location: right hip Pain Descriptors / Indicators: Sore;Guarding Pain Intervention(s): Limited activity within patient's tolerance;Monitored during session;Repositioned           PT Goals (current goals can now be found in the care plan section) Acute Rehab PT Goals Patient Stated Goal: return home Progress towards PT goals: Progressing toward goals    Frequency  7X/week      PT Plan Current plan remains appropriate       End of Session   Activity Tolerance: Patient limited by pain Patient left: in chair;with call bell/phone within reach;with family/visitor present     Time: [id number] PT Time Calculation (min) (ACUTE ONLY): 20 min  Charges:  $Gait Training: 8-22 mins                      Lysle Yero B. [place], [place], DPT [phone]   08/29/2016, 4:27 PM

## 2016-08-29 NOTE — Care Management Important Message (Signed)
Important Message  Patient Details  Name: [name] Page MRN: [id number] Date of Birth: 03-22-1931   Medicare Important Message Given:  Yes    Bryanah Sidell 08/29/2016, 1:25 PM

## 2016-08-29 NOTE — Progress Notes (Signed)
OT Cancellation Note  Patient Details Name: [name] Page MRN: [id number] DOB: 1931-05-11   Cancelled Treatment:    Reason Eval/Treat Not Completed: Patient declined, no reason specified. Quickly reviewed sequence for shower safety, and LB dressing. Pt familiar and states understanding from previous hip replacement. Pt and husband with no further questions or concerns and feel confident going home from an OT perspective.  [place] 08/29/2016, 3:10 PM  Hulda Humphrey OTR/L [phone]

## 2016-08-29 NOTE — Discharge Summary (Signed)
Patient ID: [name] Page MRN: [id number] DOB/AGE: 81/21/1932 81 y.o.  Admit date: 08/26/2016 Discharge date: 08/29/2016  Admission Diagnoses:  Principal Problem:   Primary localized osteoarthritis of right hip Active Problems:   Primary osteoarthritis of right hip   Discharge Diagnoses:  Same  Past Medical History:  Diagnosis Date  . Arthritis   . Cervical spondylosis    C3-4 AND C4-5  . Chest pain 07/28/2008   H/O, normal stress nuclear EF 78%  . Collapsed lung  12/11   being treated at [facility] - in left lung  . Complication of anesthesia   . Dislocation closed, shoulder 7/14  . DVT (deep venous thrombosis) ([place]) 2006  . GERD (gastroesophageal reflux disease)    occ  . Headache    hx  . High cholesterol   . Hypertension   . Hypothyroidism   . Peripheral vascular disease ([place]) 2006   dvt's and 50 yrs ago  . PONV (postoperative nausea and vomiting)   . Sarcoidosis of lung ([place])     Surgeries: Procedure(s): TOTAL HIP ARTHROPLASTY ANTERIOR APPROACH on 08/26/2016   Consultants:   Discharged Condition: Improved  Hospital Course: [name] Page is an 83 y.o. female who was admitted 08/26/2016 for operative treatment ofPrimary localized osteoarthritis of right hip. Patient has severe unremitting pain that affects sleep, daily activities, and work/hobbies. After pre-op clearance the patient was taken to the operating room on 08/26/2016 and underwent  Procedure(s): TOTAL HIP ARTHROPLASTY ANTERIOR APPROACH.    Patient was given perioperative antibiotics: Anti-infectives    Start     Dose/Rate Route Frequency Ordered Stop   08/26/16 1605  ceFAZolin (ANCEF) 2-4 GM/100ML-% IVPB    Comments:  Reynolds, Lauren   : cabinet override      08/26/16 1605 08/27/16 0414   08/26/16 1545  ceFAZolin (ANCEF) IVPB 2g/100 mL premix     2 g 200 mL/hr over 30 Minutes Intravenous Every 6 hours 08/26/16 1541 08/26/16 2108   08/26/16 0740  ceFAZolin (ANCEF) IVPB 2g/100 mL premix     2 g 200  mL/hr over 30 Minutes Intravenous On call to O.R. 08/26/16 0740 08/26/16 1020       Patient was given sequential compression devices, early ambulation, and chemoprophylaxis to prevent DVT.  Patient benefited maximally from hospital stay and there were no complications.    Recent vital signs: Patient Vitals for the past 24 hrs:  BP Temp Temp src Pulse Resp SpO2  08/29/16 0742 (!) 141/44 98.7 F (37.1 C) Tympanic 85 16 99 %  08/28/16 2149 (!) 144/46 98.2 F (36.8 C) Oral 73 16 94 %  08/28/16 1758 (!) 128/57 - - - - -  08/28/16 1550 (!) 123/48 100.2 F (37.9 C) - 74 16 98 %     Recent laboratory studies:  Recent Labs  08/28/16 0525  WBC 6.4  HGB 9.5*  HCT 27.4*  PLT 188  NA 135  K 4.4  CL 103  CO2 27  BUN 6  CREATININE 0.53  GLUCOSE 117*  CALCIUM 8.6*     Discharge Medications:   Allergies as of 08/29/2016   No Known Allergies     Medication List    STOP taking these medications   aspirin 325 MG tablet   losartan 50 MG tablet Commonly known as:  COZAAR   naproxen sodium 220 MG tablet Commonly known as:  ANAPROX     TAKE these medications   amLODipine 10 MG tablet Commonly known as:  NORVASC Take  10 mg by mouth daily.   apixaban 2.5 MG Tabs tablet Commonly known as:  ELIQUIS Take 1 tablet (2.5 mg total) by mouth every 12 (twelve) hours.   azelastine 0.05 % ophthalmic solution Commonly known as:  OPTIVAR Place 1 drop into both eyes 2 (two) times daily.   bisacodyl 5 MG EC tablet Commonly known as:  DULCOLAX Take 5 mg by mouth 2 (two) times daily. For constipation   diphenhydrAMINE 50 MG tablet Commonly known as:  BENADRYL Take 100 mg by mouth at bedtime.   fluticasone 50 MCG/ACT nasal spray Commonly known as:  FLONASE Place 2 sprays into both nostrils daily.   folic acid 1 MG tablet Commonly known as:  FOLVITE TAKE 1 TABLET (1 MG TOTAL) BY MOUTH DAILY.   gemfibrozil 600 MG tablet Commonly known as:  LOPID Take 600 mg by mouth 2 (two)  times daily before a meal.   levothyroxine 25 MCG tablet Commonly known as:  SYNTHROID, LEVOTHROID Take 25 mcg by mouth daily.   LORazepam 0.5 MG tablet Commonly known as:  ATIVAN Take 0.5 mg by mouth every 8 (eight) hours as needed for anxiety or sleep.   methotrexate 5 MG tablet Commonly known as:  RHEUMATREX Take 1 tablet (5 mg total) by mouth once a week. On Mondays or Sundays.  Caution: Chemotherapy. Protect from light. What changed:  additional instructions   omeprazole 20 MG capsule Commonly known as:  PRILOSEC Take 20 mg by mouth daily as needed (heartburn).   predniSONE 5 MG tablet Commonly known as:  DELTASONE TAKE 1 TABLET BY MOUTH EVERY DAY WITH BREAKFAST            Durable Medical Equipment        Start     Ordered   08/26/16 1740  DME Walker rolling  Once    Question:  Patient needs a walker to treat with the following condition  Answer:  Primary osteoarthritis of right hip   08/26/16 1739   08/26/16 1740  DME 3 n 1  Once     01 /16/18 1739   08/26/16 1740  DME Bedside commode  Once    Question:  Patient needs a bedside commode to treat with the following condition  Answer:  Primary osteoarthritis of right hip   08/26/16 1739      Diagnostic Studies: Dg Chest 2 View  Result Date: 08/18/2016 CLINICAL DATA:  Preoperative evaluation for RIGHT hip replacement, history sarcoidosis with chronic lung collapse, hypertension EXAM: CHEST  2 VIEW COMPARISON:  [facility] 8,017 FINDINGS: Normal heart size, mediastinal contours, and pulmonary vascularity. Lungs clear. No pleural effusion or pneumothorax. Calcified retrosternal granuloma in LEFT upper lobe on lateral view. Calcified gallstone at gallbladder 14 mm diameter. Diffuse osseous demineralization. IMPRESSION: No acute abnormalities. Electronically Signed   By: Lavonia Dana M.D.   On: 08/18/2016 15:41   Dg C-arm 1-60 Min  Result Date: 08/26/2016 CLINICAL DATA:  Right hip replacement. EXAM: DG C-ARM 61-120 MIN;  OPERATIVE RIGHT HIP WITH PELVIS COMPARISON:  05/31/2016 FINDINGS: Interval placement of right total hip arthroplasty with prosthetic components normally located and intact. Evidence of known left hip arthroplasty unchanged. Recommend correlation with findings at the time of the procedure. IMPRESSION: Right total hip arthroplasty intact. Electronically Signed   By: Marin Olp M.D.   On: 08/26/2016 11:36   Dg Hip Operative Unilat W Or W/o Pelvis Right  Result Date: 08/26/2016 CLINICAL DATA:  Right hip replacement. EXAM: DG C-ARM 61-120 MIN; OPERATIVE RIGHT  HIP WITH PELVIS COMPARISON:  05/31/2016 FINDINGS: Interval placement of right total hip arthroplasty with prosthetic components normally located and intact. Evidence of known left hip arthroplasty unchanged. Recommend correlation with findings at the time of the procedure. IMPRESSION: Right total hip arthroplasty intact. Electronically Signed   By: Marin Olp M.D.   On: 08/26/2016 11:36    Disposition: 01-Home or Self Care  Discharge Instructions    Call MD / Call 911    Complete by:  As directed    If you experience chest pain or shortness of breath, CALL 911 and be transported to the hospital emergency room.  If you develope a fever above 101 F, pus (white drainage) or increased drainage or redness at the wound, or calf pain, call your surgeon's office.   Constipation Prevention    Complete by:  As directed    Drink plenty of fluids.  Prune juice may be helpful.  You may use a stool softener, such as Colace (over the counter) 100 mg twice a day.  Use MiraLax (over the counter) for constipation as needed.   Diet - low sodium heart healthy    Complete by:  As directed    Discharge instructions    Complete by:  As directed    INSTRUCTIONS AFTER JOINT REPLACEMENT   Remove items at home which could result in a fall. This includes throw rugs or furniture in walking pathways ICE to the affected joint every three hours while awake for 30  minutes at a time, for at least the first 3-5 days, and then as needed for pain and swelling.  Continue to use ice for pain and swelling. You may notice swelling that will progress down to the foot and ankle.  This is normal after surgery.  Elevate your leg when you are not up walking on it.   Continue to use the breathing machine you got in the hospital (incentive spirometer) which will help keep your temperature down.  It is common for your temperature to cycle up and down following surgery, especially at night when you are not up moving around and exerting yourself.  The breathing machine keeps your lungs expanded and your temperature down.   DIET:  As you were doing prior to hospitalization, we recommend a well-balanced diet.  DRESSING / WOUND CARE / SHOWERING  You may shower 3 days after surgery, but keep the wounds dry during showering.  You may use an occlusive plastic wrap (Press'n Seal for example), NO SOAKING/SUBMERGING IN THE BATHTUB.  If the bandage gets wet, change with a clean dry gauze.  If the incision gets wet, pat the wound dry with a clean towel.  ACTIVITY  Increase activity slowly as tolerated, but follow the weight bearing instructions below.   No driving for 6 weeks or until further direction given by your physician.  You cannot drive while taking narcotics.  No lifting or carrying greater than 10 lbs. until further directed by your surgeon. Avoid periods of inactivity such as sitting longer than an hour when not asleep. This helps prevent blood clots.  You may return to work once you are authorized by your doctor.     WEIGHT BEARING   Weight bearing as tolerated with assist device (walker, cane, etc) as directed, use it as long as suggested by your surgeon or therapist, typically at least 4-6 weeks.   EXERCISES  Results after joint replacement surgery are often greatly improved when you follow the exercise, range of motion and  muscle strengthening exercises  prescribed by your doctor. Safety measures are also important to protect the joint from further injury. Any time any of these exercises cause you to have increased pain or swelling, decrease what you are doing until you are comfortable again and then slowly increase them. If you have problems or questions, call your caregiver or physical therapist for advice.   Rehabilitation is important following a joint replacement. After just a few days of immobilization, the muscles of the leg can become weakened and shrink (atrophy).  These exercises are designed to build up the tone and strength of the thigh and leg muscles and to improve motion. Often times heat used for twenty to thirty minutes before working out will loosen up your tissues and help with improving the range of motion but do not use heat for the first two weeks following surgery (sometimes heat can increase post-operative swelling).   These exercises can be done on a training (exercise) mat, on the floor, on a table or on a bed. Use whatever works the best and is most comfortable for you.    Use music or television while you are exercising so that the exercises are a pleasant break in your day. This will make your life better with the exercises acting as a break in your routine that you can look forward to.   Perform all exercises about fifteen times, three times per day or as directed.  You should exercise both the operative leg and the other leg as well.   Exercises include:   Quad Sets - Tighten up the muscle on the front of the thigh (Quad) and hold for 5-10 seconds.   Straight Leg Raises - With your knee straight (if you were given a brace, keep it on), lift the leg to 60 degrees, hold for 3 seconds, and slowly lower the leg.  Perform this exercise against resistance later as your leg gets stronger.  Leg Slides: Lying on your back, slowly slide your foot toward your buttocks, bending your knee up off the floor (only go as far as is  comfortable). Then slowly slide your foot back down until your leg is flat on the floor again.  Angel Wings: Lying on your back spread your legs to the side as far apart as you can without causing discomfort.  Hamstring Strength:  Lying on your back, push your heel against the floor with your leg straight by tightening up the muscles of your buttocks.  Repeat, but this time bend your knee to a comfortable angle, and push your heel against the floor.  You may put a pillow under the heel to make it more comfortable if necessary.   A rehabilitation program following joint replacement surgery can speed recovery and prevent re-injury in the future due to weakened muscles. Contact your doctor or a physical therapist for more information on knee rehabilitation.    CONSTIPATION  Constipation is defined medically as fewer than three stools per week and severe constipation as less than one stool per week.  Even if you have a regular bowel pattern at home, your normal regimen is likely to be disrupted due to multiple reasons following surgery.  Combination of anesthesia, postoperative narcotics, change in appetite and fluid intake all can affect your bowels.   YOU MUST use at least one of the following options; they are listed in order of increasing strength to get the job done.  They are all available over the counter, and you may need  to use some, POSSIBLY even all of these options:    Drink plenty of fluids (prune juice may be helpful) and high fiber foods Colace 100 mg by mouth twice a day  Senokot for constipation as directed and as needed Dulcolax (bisacodyl), take with full glass of water  Miralax (polyethylene glycol) once or twice a day as needed.  If you have tried all these things and are unable to have a bowel movement in the first 3-4 days after surgery call either your surgeon or your primary doctor.    If you experience loose stools or diarrhea, hold the medications until you stool forms back  up.  If your symptoms do not get better within 1 week or if they get worse, check with your doctor.  If you experience "the worst abdominal pain ever" or develop nausea or vomiting, please contact the office immediately for further recommendations for treatment.   ITCHING:  If you experience itching with your medications, try taking only a single pain pill, or even half a pain pill at a time.  You can also use Benadryl over the counter for itching or also to help with sleep.   TED HOSE STOCKINGS:  Use stockings on both legs until for at least 2 weeks or as directed by physician office. They may be removed at night for sleeping.  MEDICATIONS:  See your medication summary on the "After Visit Summary" that nursing will review with you.  You may have some home medications which will be placed on hold until you complete the course of blood thinner medication.  It is important for you to complete the blood thinner medication as prescribed.  PRECAUTIONS:  If you experience chest pain or shortness of breath - call 911 immediately for transfer to the hospital emergency department.   If you develop a fever greater that 101 F, purulent drainage from wound, increased redness or drainage from wound, foul odor from the wound/dressing, or calf pain - CONTACT YOUR SURGEON.                                                   FOLLOW-UP APPOINTMENTS:  If you do not already have a post-op appointment, please call the office for an appointment to be seen by your surgeon.  Guidelines for how soon to be seen are listed in your "After Visit Summary", but are typically between 1-4 weeks after surgery.  OTHER INSTRUCTIONS:   Knee Replacement:  Do not place pillow under knee, focus on keeping the knee straight while resting. CPM instructions: 0-90 degrees, 2 hours in the morning, 2 hours in the afternoon, and 2 hours in the evening. Place foam block, curve side up under heel at all times except when in CPM or when walking.  DO  NOT modify, tear, cut, or change the foam block in any way.  MAKE SURE YOU:  Understand these instructions.  Get help right away if you are not doing well or get worse.    Thank you for letting us be a part of your medical care team.  It is a privilege we respect greatly.  We hope these instructions will help you stay on track for a fast and full recovery!   Increase activity slowly as tolerated    Complete by:  As directed       Follow-up Information  Hessie Dibble, MD. Schedule an appointment as soon as possible for a visit in 2 weeks.   Specialty:  Orthopedic Surgery Contact information: [address] [place] [address] [phone]            Signed: Rich Fuchs 08/29/2016, 12:48 PM

## 2016-08-29 NOTE — Progress Notes (Addendum)
Subjective: 3 Days Post-Op Procedure(s) (LRB): TOTAL HIP ARTHROPLASTY ANTERIOR APPROACH (Right)   Patient had some confusion last night and urinated in a trash can thinking it was a toilet. She feels will resting in bed and doesn't have any significant pain. She also still gets a little orthostatic when getting up and out of bed.  Activity level:  wbat Diet tolerance:  ok Voiding:  ok Patient reports pain as mild.    Objective: Vital signs in last 24 hours: Temp:  [98.2 F (36.8 C)-100.2 F (37.9 C)] 98.2 F (36.8 C) (01/18 2149) Pulse Rate:  [73-74] 73 (01/18 2149) Resp:  [16] 16 (01/18 2149) BP: (123-144)/(46-57) 144/46 (01/18 2149) SpO2:  [94 %-98 %] 94 % (01/18 2149)  Labs:  Recent Labs  08/28/16 0525  HGB 9.5*    Recent Labs  08/28/16 0525  WBC 6.4  RBC 2.72*  HCT 27.4*  PLT 188    Recent Labs  08/28/16 0525  NA 135  K 4.4  CL 103  CO2 27  BUN 6  CREATININE 0.53  GLUCOSE 117*  CALCIUM 8.6*   No results for input(s): LABPT, INR in the last 72 hours.  Physical Exam:  Neurologically intact ABD soft Neurovascular intact Sensation intact distally Intact pulses distally Dorsiflexion/Plantar flexion intact Incision: dressing C/D/I and no drainage No cellulitis present Compartment soft  Assessment/Plan:  3 Days Post-Op Procedure(s) (LRB): TOTAL HIP ARTHROPLASTY ANTERIOR APPROACH (Right) Advance diet Up with therapy Discharge home with home health once stabilized and no longer confused or orthostatic. We will hold home losartan and give another 500cc bolus as it helped some yesterday. We will stop all narcotics due to confusion last night and use tylenol for pain. Follow up in office 2 weeks post op. Continue on Eliquis for DVT prevention x 4 weeks.  Followed up with patient after lunch. She is feeling better. As long as she does well with PT she will go home today.  Antawn Sison, Larwance Sachs 08/29/2016, 7:36 AM

## 2016-08-29 NOTE — Care Management Note (Signed)
Case Management Note  Patient Details  Name: [name] Page MRN: [id number] Date of Birth: January 20, 1931  Subjective/Objective:   83 yr old female s/p right total hip arthroplasty.                Action/Plan: Case manager spoke with patient and husband on 08/28/16,  concerning [place] and DME needs. Patient was preoperatively setup with [facility], no changes. She has rolling walker and 3in1 at home, will have family support at discharge.    Expected Discharge Date:  08/29/16               Expected Discharge Plan:  [place]  In-House Referral:  NA  Discharge planning Services  CM Consult  Post Acute Care Choice:  Home Health Choice offered to:  Patient, Spouse  DME Arranged:    DME Agency:   (has RW and 3in1)  HH Arranged:  PT HH Agency:  [facility] (formerly [employer])  Status of Service:  Completed, signed off  If discussed at [employer], dates discussed:    Additional Comments:  Ninfa Meeker, RN 08/29/2016, 2:09 PM

## 2016-09-11 ENCOUNTER — Ambulatory Visit: Payer: Self-pay | Admitting: Orthopedic Surgery

## 2016-09-24 ENCOUNTER — Inpatient Hospital Stay: Admit: 2016-09-24 | Payer: Medicare Other | Admitting: Orthopedic Surgery

## 2016-09-24 SURGERY — ARTHROPLASTY, HIP, TOTAL, ANTERIOR APPROACH
Anesthesia: Choice | Site: Hip | Laterality: Right

## 2016-10-28 ENCOUNTER — Ambulatory Visit (INDEPENDENT_AMBULATORY_CARE_PROVIDER_SITE_OTHER): Payer: Medicare Other | Admitting: Internal Medicine

## 2016-10-28 ENCOUNTER — Encounter: Payer: Self-pay | Admitting: Internal Medicine

## 2016-10-28 DIAGNOSIS — L659 Nonscarring hair loss, unspecified: Secondary | ICD-10-CM | POA: Insufficient documentation

## 2016-10-28 DIAGNOSIS — D869 Sarcoidosis, unspecified: Secondary | ICD-10-CM

## 2016-10-28 HISTORY — DX: Nonscarring hair loss, unspecified: L65.9

## 2016-10-28 NOTE — Patient Instructions (Addendum)
Alopecia New issue 10/28/2016 x 6 months  Plan Wean off methotrexate per sarcoid section Encourage multivitamin with zinc and biotin Biotin and Zin concentrated supplements ok but do not over-take dose  Sarcoidosis CXR Jan 2018 is clear Glad you are doing well but alopeica could be due to  methotrexate  Plan Skip Sunday 11/02/16 methotrexate dose Take Sunday 11/09/16 methotrexate dose 5mg  Then stop methotrexate and folic acid Continue prednisone 5mg  for now  Followup 3 months or sooner if needed RSI Cough score at followup

## 2016-10-28 NOTE — Assessment & Plan Note (Signed)
New issue 10/28/2016 x 6 months  Plan Wean off methotrexate per sarcoid section Encourage multivitamin with zinc and biotin Biotin and Zin concentrated supplements ok but do not over-take dose

## 2016-10-28 NOTE — Progress Notes (Signed)
Subjective:     Patient ID: [name] Page, female   DOB: 09/07/1930, 83 y.o.   MRN: [id number]  HPI PCP ARONSON,RICHARD A, MD   HPI     OV 08/24/2014  Chief Complaint  Patient presents with  . Follow-up    Pt stated the gabapentin has not changed her breathing or cough. Pt c/o prod cough with white mucus. Pt denies SOB and CP/tightness.    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 Main symptom is severe chronic cough with high RSI score suggestive of irritable larynx syndrome that is associated as well  She always finds prednisone to help the cough but due to age and side effects despite good functional status she opts to take a low-dose of prednisone. Currently she is on 5 mg prednisone. In November 2015 due to RSI score of 27 for cough but decided to trial gabapentin to tackle the neurogenic component of cough. She reports now that the gabapentin has not helped her at all although objectively the RSI cough score has drop to 20. She is very frustrated. She and her husband are reluctant to have right middle lobectomy given her advanced age despite good functional status. There are no open to trying methotrexate to tackle the inflammation of sarcoidosis and as a steroid sparing agent. She also wants to come off gabapentin   OV 10/02/2014  Chief Complaint  Patient presents with  . Follow-up    Pt states her breathing is doing well. Pt states her SOB has improved since last OV. Pt c/o mild cough. Pt denies CP/tightness. Pt stated being on methotrexate has significantly helped.     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 Main symptom is severe chronic cough with high RSI score suggestive of irritable larynx syndrome that is associated as well    Last visit in January 2016 she told me that gabapentin did not help her cough. So we stopped this. We started methotrexate for  inflammation in her airway due to  sarcoid causing right middle lobe collapse. We left her on prednisone 5 mg per day and started her on methotrexate 5 mg once a week. Also gave Hycodan cough syrup. She says that the Hycodan cough syrup did not help However, 2 weeks after starting her methotrexate she started noticing dramatic improvement in her cough. She hardly coughs now. She has significant improvement in her quality of life and she is extremely happy.  RSI cough score had dropped from 25 to 20 with gabapentin has now dropped to 5.  She is asking questions about how long to take methotrexate. And also review of the side effect profile. Of note I did notice that she is on ACE inhibitor that can potentially provoke all make her cough worse. She is not taking gabapentin anymore. There is no fever or chills. At this point in time she's not interested in Bactrim PCP prophylaxis which should be okay given low-dose of methotrexate and prednisone  Past, Family, Social reviewed: no change since last visit    OV 01/02/2015  Chief Complaint  Patient presents with  . Follow-up    Pt recently went to opthamologist and pts vision has decreased since last OV. Pt stated her breathing is unchanged since last OV. Pt c/o mild dry cough. Pt denies CP/tightness.     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 Main symptom is severe chronic cough  with high RSI score suggestive of irritable larynx syndrome that is associated as well  Cough /Saarcoid / IRritiable LArynx/ RML syundrome - cough persists but is only mild and baseline. No problems. Does not feel it impacts qualkity of life. She is off ace inhibitor now  ; forgot to mention this during med reconciliation. No asociated wheeze or dyspnea. Maintained on mehtotrxate 5mg  weekly, folic acid and prednisone 5mg  daily. No evidence of infection. Last LFT was 3 months ago and normal. Hycodan helping cough and wants  refill  NEw issue - her eye doc Dr Ellie Lunch called yesterday. PAtient has no complaints of vision but post capsulootmy few week ago and now yesterday visual field deficit on Rt side is worse. Concern is ocular sarcoid is worse. They are referring her to [facility]. PAtient does not want step up Rx of sarcoid till all this is sorted out  New issue - noticed some bruising in left forearm yesterday sponatenous x 2 spots. New. Asytmptomatcic and last 1 weeks some punctate red lesion in distal 1/3 of leg. Associatd with gardening but denies poison ivy. Unchanged since insidious onset. No associatd fever.    OV 04/09/2015  Chief Complaint  Patient presents with  . Follow-up    Pt denies cough, SOB, CP/tightness. Pt states her breathing is doing well. Pt states she had a recent rash on BIL lower extremities and saw derm and was given a topical ointment that helped. Per pt the derm thought the rash was not related to sarcoid.     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 Main symptom is severe chronic cough with high RSI score suggestive of irritable larynx syndrome that is associated as well   Cough /Saarcoid / IRritiable LArynx/ RML syundrome - cough is significantly improved. RSI cough score is only 14. She feels that the cough is at a stage where it is completely fine and she can handle things. She is maintained on methotrexate 5 mg weekly, folic acid and prednisone 5 mg daily. She did have some lowish white count after last visit and anemia but we held to the methotrexate and it seems to be stable. She also had a rash at the time of last visit she has seen dermatology and apparently the biopsies do not show this is due to sarcoid. I personally review this outside chart and confirmed that. Of note her last chest x-ray was November 2015 which showed continued right middle lobe collapse. The cough is controlled with the methotrexate and prednisone. She is no  longer on ACE inhibitor even though our MAR reports that Also noted that she reports some mild weight gain with prednisone but she says she will try dietary measures to shake this weight off. She does not want to cough to come and therefore she is content continue with the methotrexate and prednisone. In terms of her vision she is [facility] in October 2016.    OV 07/16/2015  Chief Complaint  Patient presents with  . Follow-up    Pt states her breathing is unchanged but her cough has worsened. Pt states her SOB is at her baseline. Pt states her cough is nonproductive and worse in the evening. Pt denies CP/tightness.     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 Main symptom is severe chronic cough with high RSI score suggestive of irritable larynx syndrome that is associated as well  Cough /Saarcoid / IRritiable LArynx/ RML syundrome -she now reports that her cough is worse for the last few months especially since last visit. RSI cough score is 18 and subjectively she feels that cough is worse by 25%. Otherwise she is okay. Cough is associated with worsening postnasal drip. She is also asking for refills on a cough syrup methotrexate folic acid. She prefers to have 90 day refills. Despite cough worsened she is not too keen on gabapentin therapy and wants to try other simpler measures at this point in time. There is no fever or sputum production that is associated with the cough.  In terms of her ophthalmology issues she has been reassured apparently it is a left optic now that is problematic in this 50% gone. No active follow-up.   OV 10/18/2015   Chief Complaint  Patient presents with  . Follow-up    Pt states her cough has significantly improved. Pt denies SOB and CP/tightness. Pt states overall she feels she is doing well.     Follow-up chronic cough associated with right middle lobe syndrome  secondary to sarcoidosis and irritable larynx syndrome  Last visit December 2016. At the time cough that deteriorated. The significant amount of postnasal drip. We instituted nasal steroids and with this cough is significantly improved and back to baseline. RSI cough score is 10 which is around her baseline. She feels immensely better. She is in a good phase of her health. There are no new issues. She is pending $24 and over-the-counter nasal steroid and we discussed about doing a generic prescription which might be cheaper. She wants refill postdated on her cough syrup. She wants 90 day refills on her methotrexate. She is happy with having a next follow-up in 6 months.   OV 04/22/2016  Chief Complaint  Patient presents with  . Follow-up    Pt states her breathing is at baseline. Pt states she is doing well. Pt states has a rare cough, not needing cough syrup any longer. Pt denies CP/tightness and f/c/s.      follow-up chronic cough associated with right middle lobe syndrome secondary to sarcoidosis [also ocular sarcoid] and irritable larynx syndrome  Last visit was me was in March 2017. In the interim she has visited Designer, jewellery. She is on prednisone 5 mg per day and also methotrexate 5 mg once a week.Her cough is significantly improved. She had a chest x-ray May 2017 that I personally visualized shows significant improvement in her right middle lobe laps compared to 2011/2012. She's not on the needing cough syrup. She has right hip DJD now and apparently hip surgery might be contemplated. She is using nasal ster  oid. She does not do Bactrim prophylaxis. RSI cough score show significant improvement with score of 6 which is only mild cough.   OV 10/28/2016  Chief Complaint  Patient presents with  . Follow-up    sarcoidosis, wants to discuss hair loss    Fu Chronic cough due to right middle lobe syndrome due to sarcoidosis with associated with ocular sarcoidosis. On methotrexate 5 mg  once weekly associated with prednisone low dose 4 mg/5 mg   She continues to do well. Cough is improved 1 RSI cough score 5 is documented below. In January 2008 and she had replacement and she had a clear chest x-ray that I personally visualized. The cough is only mild. She has a new problem in the last 6 months of mild alopecia that is persistent and stable. Not associated with  any pain or radiation or aggravating or relieving factors. It is associated with methotrexate intake. She wants to know about taking Biotene supplements that was advised by the hairdresser. Husband is wondering if methotrexate is the etiology for this. Methotrexate can cause less than 10% alopecia not otherwise specified.  Dr Lorenza [place] Reflux Symptom Index (> 13-15 suggestive of LPR cough) Dec 2016 10/18/2015  04/22/2016  10/28/2016   Hoarseness of problem with voice 2 3 3 1   Clearing  Of Throat 3 2 1 1   Excess throat mucus or feeling of post nasal drip 1 1 0 1  Difficulty swallowing food, liquid or tablets 2 1 0 1  Cough after eating or lying down 3 0 0 0  Breathing difficulties or choking episodes 1 0 0 0  Troublesome or annoying cough 3 1 0 0  Sensation of something sticking in throat or lump in throat 0 1 0 0  Heartburn, chest pain, indigestion, or stomach acid coming up 3 1 2 1   TOTAL 18 10 6 5       has a past medical history of Arthritis; Cervical spondylosis; Chest pain (07/28/2008); Collapsed lung ( 12/11); Complication of anesthesia; Dislocation closed, shoulder (7/14); DVT (deep venous thrombosis) ([place]) (2006); GERD (gastroesophageal reflux disease); Headache; High cholesterol; Hypertension; Hypothyroidism; Peripheral vascular disease ([place]) (2006); PONV (postoperative nausea and vomiting); and Sarcoidosis of lung ([facility]).   reports that she has never smoked. She has never used smokeless tobacco.  Past Surgical History:  Procedure Laterality Date  . EYE SURGERY    . FOOT SURGERY Left 06/2005   bunion hammer  toe  . HEMORRHOID SURGERY  12/1972  . INTRAOCULAR LENS INSERTION     right eye 10 /16 and left 06/16/2005  . SKIN BIOPSY  01/2007   basal cell carcinoma removed from right lower leg   . TOTAL HIP ARTHROPLASTY  06/2007   left  . TOTAL HIP ARTHROPLASTY Right 08/26/2016   Procedure: TOTAL HIP ARTHROPLASTY ANTERIOR APPROACH;  Surgeon: Melrose Nakayama, MD;  Location: [facility];  Service: Orthopedics;  Laterality: Right;  . TOTAL KNEE ARTHROPLASTY Right 10/2007   right    No Known Allergies  Immunization History  Administered Date(s) Administered  . Influenza Split 04/11/2013, 04/11/2014  . Influenza Whole 04/15/2010  . Influenza,inj,Quad PF,36+ Mos 05/07/2015  . Pneumococcal Polysaccharide-23 05/13/2004  . Pneumococcal-Unspecified 08/11/2010, 03/11/2014  . Td 02/08/2002  . Zoster 06/12/2007    Family History  Problem Relation Age of Onset  . Diabetes Mother   . Heart failure Mother   . Hypertension Father   . Diabetes Father   . Heart disease Father   . Heart attack Father      Current Outpatient Prescriptions:  .  amLODipine (NORVASC) 10 MG tablet, Take 10 mg by mouth daily., Disp: , Rfl:  .  azelastine (OPTIVAR) 0.05 % ophthalmic solution, Place 1 drop into both eyes 2 (two) times daily. , Disp: , Rfl:  .  bisacodyl (DULCOLAX) 5 MG EC tablet, Take 5 mg by mouth 2 (two) times daily. For constipation , Disp: , Rfl:  .  diphenhydrAMINE (BENADRYL) 50 MG tablet, Take 100 mg by mouth at bedtime., Disp: , Rfl:  .  fluticasone (FLONASE) 50 MCG/ACT nasal spray, Place 2 sprays into both nostrils daily., Disp: , Rfl:  .  folic acid (FOLVITE) 1 MG tablet, TAKE 1 TABLET (1 MG TOTAL) BY MOUTH DAILY., Disp: 90 tablet, Rfl: 3 .  gemfibrozil (LOPID) 600 MG tablet, Take 600 mg by mouth  2 (two) times daily before a meal., Disp: , Rfl:  .  levothyroxine (SYNTHROID, LEVOTHROID) 25 MCG tablet, Take 25 mcg by mouth daily. , Disp: , Rfl:  .  LORazepam (ATIVAN) 0.5 MG tablet, Take 0.5 mg by mouth every 8  (eight) hours as needed for anxiety or sleep. , Disp: , Rfl:  .  methotrexate (RHEUMATREX) 5 MG tablet, Take 1 tablet (5 mg total) by mouth once a week. On Mondays or Sundays.  Caution: Chemotherapy. Protect from light. (Patient taking differently: Take 5 mg by mouth once a week. On Sundays  Caution: Chemotherapy. Protect from light.), Disp: 12 tablet, Rfl: 3 .  omeprazole (PRILOSEC) 20 MG capsule, Take 20 mg by mouth daily as needed (heartburn). , Disp: , Rfl: 7 .  predniSONE (DELTASONE) 5 MG tablet, TAKE 1 TABLET BY MOUTH EVERY DAY WITH BREAKFAST, Disp: 90 tablet, Rfl: 3   Review of Systems     Objective:   Physical Exam  Constitutional: She is oriented to person, place, and time. She appears well-developed and well-nourished. No distress.  HENT:  Head: Normocephalic and atraumatic.  Right Ear: External ear normal.  Left Ear: External ear normal.  Mouth/Throat: Oropharynx is clear and moist. No oropharyngeal exudate.  Mild hoarse voice  Eyes: Conjunctivae and EOM are normal. Pupils are equal, round, and reactive to light. Right eye exhibits no discharge. Left eye exhibits no discharge. No scleral icterus.  Neck: Normal range of motion. Neck supple. No JVD present. No tracheal deviation present. No thyromegaly present.  Cardiovascular: Normal rate, regular rhythm, normal heart sounds and intact distal pulses.  Exam reveals no gallop and no friction rub.   No murmur heard. Pulmonary/Chest: Effort normal and breath sounds normal. No respiratory distress. She has no wheezes. She has no rales. She exhibits no tenderness.  Abdominal: Soft. Bowel sounds are normal. She exhibits no distension and no mass. There is no tenderness. There is no rebound and no guarding.  Musculoskeletal: Normal range of motion. She exhibits no edema or tenderness.  Lymphadenopathy:    She has no cervical adenopathy.  Neurological: She is alert and oriented to person, place, and time. She has normal reflexes. No  cranial nerve deficit. She exhibits normal muscle tone. Coordination normal.  Skin: Skin is warm and dry. No rash noted. She is not diaphoretic. No erythema. No pallor.  Psychiatric: She has a normal mood and affect. Her behavior is normal. Judgment and thought content normal.  Vitals reviewed.  Vitals:   10/28/16 0911  BP: 120/68  Pulse: 61  SpO2: 99%  Weight: 138 lb 6.4 oz (62.8 kg)  Height: 5\' 1"  (1.549 m)    Estimated body mass index is 26.15 kg/m as calculated from the following:   Height as of this encounter: 5\' 1"  (1.549 m).   Weight as of this encounter: 138 lb 6.4 oz (62.8 kg).     Assessment:     1. Alopecia 2. Sarcoidosis ([place])      Plan:     Alopecia New issue 10/28/2016 x 6 months  Plan Wean off methotrexate per sarcoid section Encourage multivitamin with zinc and biotin Biotin and Zin concentrated supplements ok but do not over-take dose  Sarcoidosis CXR Jan 2018 is clear Glad you are doing well but alopeica could be due to  methotrexate  Plan Skip Sunday 11/02/16 methotrexate dose Take Sunday 11/09/16 methotrexate dose 5mg  Then stop methotrexate and folic acid Continue prednisone 5mg  for now  Followup 3 months or sooner if  needed RSI Cough score at followup    Dr. Brand Males, M.D., [facility].C.P Pulmonary and Critical Care Medicine Staff Physician [facility] Pager: [phone], If no answer or between  15:00h - 7:00h: call [phone]  10/28/2016 9:35 AM

## 2016-10-28 NOTE — Assessment & Plan Note (Addendum)
CXR Jan 2018 is clear Glad you are doing well but alopeica could be due to  methotrexate  Plan Skip Sunday 11/02/16 methotrexate dose Take Sunday 11/09/16 methotrexate dose 5mg  Then stop methotrexate and folic acid Continue prednisone 5mg  for now  Followup 3 months or sooner if needed RSI Cough score at followup

## 2016-12-24 ENCOUNTER — Encounter: Payer: Self-pay | Admitting: Gynecology

## 2016-12-31 ENCOUNTER — Other Ambulatory Visit: Payer: Self-pay | Admitting: Internal Medicine

## 2017-01-10 ENCOUNTER — Encounter (HOSPITAL_COMMUNITY): Payer: Self-pay | Admitting: Orthopaedic Surgery

## 2017-01-10 NOTE — Addendum Note (Signed)
Addendum  created 01/10/17 0817 by Duane Boston, MD   Sign clinical note

## 2017-01-13 ENCOUNTER — Ambulatory Visit (INDEPENDENT_AMBULATORY_CARE_PROVIDER_SITE_OTHER): Payer: Medicare Other | Admitting: Internal Medicine

## 2017-01-13 ENCOUNTER — Encounter: Payer: Self-pay | Admitting: Internal Medicine

## 2017-01-13 ENCOUNTER — Ambulatory Visit (INDEPENDENT_AMBULATORY_CARE_PROVIDER_SITE_OTHER): Payer: Medicare Other | Admitting: Gynecology

## 2017-01-13 ENCOUNTER — Encounter: Payer: Self-pay | Admitting: Gynecology

## 2017-01-13 VITALS — BP 130/72 | Ht 61.0 in | Wt 137.0 lb

## 2017-01-13 DIAGNOSIS — M8580 Other specified disorders of bone density and structure, unspecified site: Secondary | ICD-10-CM

## 2017-01-13 DIAGNOSIS — Z5181 Encounter for therapeutic drug level monitoring: Secondary | ICD-10-CM | POA: Diagnosis not present

## 2017-01-13 DIAGNOSIS — Z01419 Encounter for gynecological examination (general) (routine) without abnormal findings: Secondary | ICD-10-CM

## 2017-01-13 DIAGNOSIS — L659 Nonscarring hair loss, unspecified: Secondary | ICD-10-CM

## 2017-01-13 NOTE — Assessment & Plan Note (Signed)
Improving after stopping methotrexate  Plan rN to add methotrexate to allergy list with 01/13/2017 - hair loss mild

## 2017-01-13 NOTE — Patient Instructions (Signed)
Bone Densitometry Bone densitometry is an imaging test that uses a special X-ray to measure the amount of calcium and other minerals in your bones (bone density). This test is also known as a bone mineral density test or dual-energy X-ray absorptiometry (DXA). The test can measure bone density at your hip and your spine. It is similar to having a regular X-ray. You may have this test to:  Diagnose a condition that causes weak or thin bones (osteoporosis).  Predict your risk of a broken bone (fracture).  Determine how well osteoporosis treatment is working.  Tell a health care provider about:  Any allergies you have.  All medicines you are taking, including vitamins, herbs, eye drops, creams, and over-the-counter medicines.  Any problems you or family members have had with anesthetic medicines.  Any blood disorders you have.  Any surgeries you have had.  Any medical conditions you have.  Possibility of pregnancy.  Any other medical test you had within the previous 14 days that used contrast material. What are the risks? Generally, this is a safe procedure. However, problems can occur and may include the following:  This test exposes you to a very small amount of radiation.  The risks of radiation exposure may be greater to unborn children.  What happens before the procedure?  Do not take any calcium supplements for 24 hours before having the test. You can otherwise eat and drink what you usually do.  Take off all metal jewelry, eyeglasses, dental appliances, and any other metal objects. What happens during the procedure?  You may lie on an exam table. There will be an X-ray generator below you and an imaging device above you.  Other devices, such as boxes or braces, may be used to position your body properly for the scan.  You will need to lie still while the machine slowly scans your body.  The images will show up on a computer monitor. What happens after the  procedure? You may need more testing at a later time. This information is not intended to replace advice given to you by your health care provider. Make sure you discuss any questions you have with your health care provider. Document Released: 08/19/2004 Document Revised: 01/03/2016 Document Reviewed: 01/05/2014 Elsevier Interactive Patient Education  2018 [employer] Inc[employer]   

## 2017-01-13 NOTE — Patient Instructions (Signed)
Alopecia Improving after stopping methotrexate  Plan rN to add methotrexate to allergy list with 01/13/2017 - hair loss mild  Encounter for therapeutic drug monitoring Cough under control and without flare up off methotrexate x few months  Plan Stay off methotrexate due to allergy of hair loss Continue prednisone 5mg  per day (easy bruising side effect of prednisone - you are accepting)  Followup 9 months or sooner if needed

## 2017-01-13 NOTE — Progress Notes (Signed)
Subjective:     Patient ID: [name], female   DOB: 09-Mar-1931, 83 y.o.   MRN: [id number]  HPI     OV 08/24/2014  Chief Complaint  Patient presents with  . Follow-up    Pt stated the gabapentin has not changed her breathing or cough. Pt c/o prod cough with white mucus. Pt denies SOB and CP/tightness.    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 Main symptom is severe chronic cough with high RSI score suggestive of irritable larynx syndrome that is associated as well  She always finds prednisone to help the cough but due to age and side effects despite good functional status she opts to take a low-dose of prednisone. Currently she is on 5 mg prednisone. In November 2015 due to RSI score of 27 for cough but decided to trial gabapentin to tackle the neurogenic component of cough. She reports now that the gabapentin has not helped her at all although objectively the RSI cough score has drop to 20. She is very frustrated. She and her husband are reluctant to have right middle lobectomy given her advanced age despite good functional status. There are no open to trying methotrexate to tackle the inflammation of sarcoidosis and as a steroid sparing agent. She also wants to come off gabapentin   OV 10/02/2014  Chief Complaint  Patient presents with  . Follow-up    Pt states her breathing is doing well. Pt states her SOB has improved since last OV. Pt c/o mild cough. Pt denies CP/tightness. Pt stated being on methotrexate has significantly helped.     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 Main symptom is severe chronic cough with high RSI score suggestive of irritable larynx syndrome that is associated as well    Last visit in January 2016 she told me that gabapentin did not help her cough. So we stopped this. We started methotrexate for inflammation in her airway due to   sarcoid causing right middle lobe collapse. We left her on prednisone 5 mg per day and started her on methotrexate 5 mg once a week. Also gave Hycodan cough syrup. She says that the Hycodan cough syrup did not help However, 2 weeks after starting her methotrexate she started noticing dramatic improvement in her cough. She hardly coughs now. She has significant improvement in her quality of life and she is extremely happy.  RSI cough score had dropped from 25 to 20 with gabapentin has now dropped to 5.  She is asking questions about how long to take methotrexate. And also review of the side effect profile. Of note I did notice that she is on ACE inhibitor that can potentially provoke all make her cough worse. She is not taking gabapentin anymore. There is no fever or chills. At this point in time she's not interested in Bactrim PCP prophylaxis which should be okay given low-dose of methotrexate and prednisone  Past, Family, Social reviewed: no change since last visit    OV 01/02/2015  Chief Complaint  Patient presents with  . Follow-up    Pt recently went to opthamologist and pts vision has decreased since last OV. Pt stated her breathing is unchanged since last OV. Pt c/o mild dry cough. Pt denies CP/tightness.     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 Main symptom is severe chronic cough with high RSI score suggestive of irritable  larynx syndrome that is associated as well  Cough /Saarcoid / IRritiable LArynx/ RML syundrome - cough persists but is only mild and baseline. No problems. Does not feel it impacts qualkity of life. She is off ace inhibitor now  ; forgot to mention this during med reconciliation. No asociated wheeze or dyspnea. Maintained on mehtotrxate 5mg  weekly, folic acid and prednisone 5mg  daily. No evidence of infection. Last LFT was 3 months ago and normal. Hycodan helping cough and wants refill  NEw issue - her eye doc Dr  Ellie Lunch called yesterday. PAtient has no complaints of vision but post capsulootmy few week ago and now yesterday visual field deficit on Rt side is worse. Concern is ocular sarcoid is worse. They are referring her to [place]. PAtient does not want step up Rx of sarcoid till all this is sorted out  New issue - noticed some bruising in left forearm yesterday sponatenous x 2 spots. New. Asytmptomatcic and last 1 weeks some punctate red lesion in distal 1/3 of leg. Associatd with gardening but denies poison ivy. Unchanged since insidious onset. No associatd fever.    OV 04/09/2015  Chief Complaint  Patient presents with  . Follow-up    Pt denies cough, SOB, CP/tightness. Pt states her breathing is doing well. Pt states she had a recent rash on BIL lower extremities and saw derm and was given a topical ointment that helped. Per pt the derm thought the rash was not related to sarcoid.     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 Main symptom is severe chronic cough with high RSI score suggestive of irritable larynx syndrome that is associated as well   Cough /Saarcoid / IRritiable LArynx/ RML syundrome - cough is significantly improved. RSI cough score is only 14. She feels that the cough is at a stage where it is completely fine and she can handle things. She is maintained on methotrexate 5 mg weekly, folic acid and prednisone 5 mg daily. She did have some lowish white count after last visit and anemia but we held to the methotrexate and it seems to be stable. She also had a rash at the time of last visit she has seen dermatology and apparently the biopsies do not show this is due to sarcoid. I personally review this outside chart and confirmed that. Of note her last chest x-ray was November 2015 which showed continued right middle lobe collapse. The cough is controlled with the methotrexate and prednisone. She is no longer on ACE inhibitor even though  our MAR reports that Also noted that she reports some mild weight gain with prednisone but she says she will try dietary measures to shake this weight off. She does not want to cough to come and therefore she is content continue with the methotrexate and prednisone. In terms of her vision she is [facility] in October 2016.    OV 07/16/2015  Chief Complaint  Patient presents with  . Follow-up    Pt states her breathing is unchanged but her cough has worsened. Pt states her SOB is at her baseline. Pt states her cough is nonproductive and worse in the evening. Pt denies CP/tightness.     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 Main symptom is severe chronic cough with high RSI score suggestive of irritable larynx syndrome that is associated as well   Cough /Saarcoid / IRritiable LArynx/ RML syundrome -  she now reports that her cough is worse for the last few months especially since last visit. RSI cough score is 18 and subjectively she feels that cough is worse by 25%. Otherwise she is okay. Cough is associated with worsening postnasal drip. She is also asking for refills on a cough syrup methotrexate folic acid. She prefers to have 90 day refills. Despite cough worsened she is not too keen on gabapentin therapy and wants to try other simpler measures at this point in time. There is no fever or sputum production that is associated with the cough.  In terms of her ophthalmology issues she has been reassured apparently it is a left optic now that is problematic in this 50% gone. No active follow-up.   OV 10/18/2015   Chief Complaint  Patient presents with  . Follow-up    Pt states her cough has significantly improved. Pt denies SOB and CP/tightness. Pt states overall she feels she is doing well.     Follow-up chronic cough associated with right middle lobe syndrome secondary to sarcoidosis and irritable  larynx syndrome  Last visit December 2016. At the time cough that deteriorated. The significant amount of postnasal drip. We instituted nasal steroids and with this cough is significantly improved and back to baseline. RSI cough score is 10 which is around her baseline. She feels immensely better. She is in a good phase of her health. There are no new issues. She is pending $24 and over-the-counter nasal steroid and we discussed about doing a generic prescription which might be cheaper. She wants refill postdated on her cough syrup. She wants 90 day refills on her methotrexate. She is happy with having a next follow-up in 6 months.   OV 04/22/2016  Chief Complaint  Patient presents with  . Follow-up    Pt states her breathing is at baseline. Pt states she is doing well. Pt states has a rare cough, not needing cough syrup any longer. Pt denies CP/tightness and f/c/s.      follow-up chronic cough associated with right middle lobe syndrome secondary to sarcoidosis [also ocular sarcoid] and irritable larynx syndrome  Last visit was me was in March 2017. In the interim she has visited Designer, jewellery. She is on prednisone 5 mg per day and also methotrexate 5 mg once a week.Her cough is significantly improved. She had a chest x-ray May 2017 that I personally visualized shows significant improvement in her right middle lobe laps compared to 2011/2012. She's not on the needing cough syrup. She has right hip DJD now and apparently hip surgery might be contemplated. She is using nasal ster  oid. She does not do Bactrim prophylaxis. RSI cough score show significant improvement with score of 6 which is only mild cough.   OV 10/28/2016  Chief Complaint  Patient presents with  . Follow-up    sarcoidosis, wants to discuss hair loss    Fu Chronic cough due to right middle lobe syndrome due to sarcoidosis with associated with ocular sarcoidosis. On methotrexate 5 mg once weekly associated with prednisone  low dose 4 mg/5 mg   She continues to do well. Cough is improved 1 RSI cough score 5 is documented below. In January 2008 and she had replacement and she had a clear chest x-ray that I personally visualized. The cough is only mild. She has a new problem in the last 6 months of mild alopecia that is persistent and stable. Not associated with any pain or radiation or aggravating or  relieving factors. It is associated with methotrexate intake. She wants to know about taking Biotene supplements that was advised by the hairdresser. Husband is wondering if methotrexate is the etiology for this. Methotrexate can cause less than 10% alopecia not otherwise specified.   OV 01/13/2017  Chief Complaint  Patient presents with  . Follow-up    Pt states her breathing is baseline. Pt states she has weaned off the methotrexate. Pt states she has mild dry cough. Pt denies CP/tightness.     83 year old female follow-up sarcoidosis associated with severe chronic cough right middle lobe syndrome and associated ocular sarcoidosis. Previously on methotrexate but low-dose prednisone currently.  Last visit in March 2018 because of good control cough and new onset of alopecia has mild he stopped the methotrexate. After this she is here for follow-up. She says that her cough has not gotten worse in the absence of methotrexate. Of note methotrexate was the only agent to improve her cough a few years ago. Her alopecia is improving after stopping the methotrexate. She strongly believes the methotrexate as the cause of alopecia and is agreeable for methotrexate to be listed in her allergies. She is continuing 5 mg of redness on her day. She plans to continue this because of ocular sarcoidosis as well. Her weight is causing some easy bruising which she is accepting. Today RSI cough score is only 3 and shows good control of cough    Dr Lorenza [place] Reflux Symptom Index (> 13-15 suggestive of LPR cough) Dec 2016 10/18/2015  04/22/2016   10/28/2016  01/13/2017   Hoarseness of problem with voice 2 3 3 1 1   Clearing  Of Throat 3 2 1 1  0  Excess throat mucus or feeling of post nasal drip 1 1 0 1 0  Difficulty swallowing food, liquid or tablets 2 1 0 1 0  Cough after eating or lying down 3 0 0 0 0  Breathing difficulties or choking episodes 1 0 0 0 2  Troublesome or annoying cough 3 1 0 0 0  Sensation of something sticking in throat or lump in throat 0 1 0 0 0  Heartburn, chest pain, indigestion, or stomach acid coming up 3 1 2 1 1   TOTAL 18 10 6 5 3           has a past medical history of Arthritis; Cervical spondylosis; Chest pain (07/28/2008); Collapsed lung ( 12/11); Complication of anesthesia; Dislocation closed, shoulder (7/14); DVT (deep venous thrombosis) ([place]) (2006); GERD (gastroesophageal reflux disease); Headache; High cholesterol; Hypertension; Hypothyroidism; Peripheral vascular disease ([place]) (2006); PONV (postoperative nausea and vomiting); and Sarcoidosis of lung ([place]).   reports that she has never smoked. She has never used smokeless tobacco.  Past Surgical History:  Procedure Laterality Date  . EYE SURGERY    . FOOT SURGERY Left 06/2005   bunion hammer toe  . HEMORRHOID SURGERY  12/1972  . INTRAOCULAR LENS INSERTION     right eye 10 /16 and left 06/16/2005  . SKIN BIOPSY  01/2007   basal cell carcinoma removed from right lower leg   . TOTAL HIP ARTHROPLASTY  06/2007   left  . TOTAL HIP ARTHROPLASTY Right 08/26/2016   Procedure: TOTAL HIP ARTHROPLASTY ANTERIOR APPROACH;  Surgeon: Melrose Nakayama, MD;  Location: [facility];  Service: Orthopedics;  Laterality: Right;  . TOTAL KNEE ARTHROPLASTY Right 10/2007   right    No Known Allergies  Immunization History  Administered Date(s) Administered  . Influenza Split 04/11/2013, 04/11/2014  . Influenza Whole  04/15/2010  . Influenza,inj,Quad PF,36+ Mos 05/07/2015, 04/11/2016  . Pneumococcal Polysaccharide-23 05/13/2004  . Pneumococcal-Unspecified 08/11/2010,  03/11/2014  . Td 02/08/2002  . Zoster 06/12/2007    Family History  Problem Relation Age of Onset  . Diabetes Mother   . Heart failure Mother   . Hypertension Father   . Diabetes Father   . Heart disease Father   . Heart attack Father      Current Outpatient Prescriptions:  .  amLODipine (NORVASC) 10 MG tablet, Take 10 mg by mouth daily., Disp: , Rfl:  .  azelastine (OPTIVAR) 0.05 % ophthalmic solution, Place 1 drop into both eyes 2 (two) times daily. , Disp: , Rfl:  .  bisacodyl (DULCOLAX) 5 MG EC tablet, Take 5 mg by mouth 2 (two) times daily. For constipation , Disp: , Rfl:  .  diphenhydrAMINE (BENADRYL) 50 MG tablet, Take 100 mg by mouth at bedtime., Disp: , Rfl:  .  fluticasone (FLONASE) 50 MCG/ACT nasal spray, PLACE 2 SPRAYS INTO BOTH NOSTRILS DAILY., Disp: 16 g, Rfl: 3 .  gemfibrozil (LOPID) 600 MG tablet, Take 600 mg by mouth 2 (two) times daily before a meal., Disp: , Rfl:  .  levothyroxine (SYNTHROID, LEVOTHROID) 25 MCG tablet, Take 25 mcg by mouth daily. , Disp: , Rfl:  .  LORazepam (ATIVAN) 0.5 MG tablet, Take 0.5 mg by mouth every 8 (eight) hours as needed for anxiety or sleep. , Disp: , Rfl:  .  omeprazole (PRILOSEC) 20 MG capsule, Take 20 mg by mouth daily as needed (heartburn). , Disp: , Rfl: 7 .  predniSONE (DELTASONE) 5 MG tablet, TAKE 1 TABLET BY MOUTH EVERY DAY WITH BREAKFAST, Disp: 90 tablet, Rfl: 3    Review of Systems     Objective:   Physical Exam  Constitutional: She is oriented to person, place, and time. She appears well-developed and well-nourished. No distress.  HENT:  Head: Normocephalic and atraumatic.  Right Ear: External ear normal.  Left Ear: External ear normal.  Mouth/Throat: Oropharynx is clear and moist. No oropharyngeal exudate.  Eyes: Conjunctivae and EOM are normal. Pupils are equal, round, and reactive to light. Right eye exhibits no discharge. Left eye exhibits no discharge. No scleral icterus.  Neck: Normal range of motion. Neck  supple. No JVD present. No tracheal deviation present. No thyromegaly present.  Cardiovascular: Normal rate, regular rhythm, normal heart sounds and intact distal pulses.  Exam reveals no gallop and no friction rub.   No murmur heard. Pulmonary/Chest: Effort normal and breath sounds normal. No respiratory distress. She has no wheezes. She has no rales. She exhibits no tenderness.  Abdominal: Soft. Bowel sounds are normal. She exhibits no distension and no mass. There is no tenderness. There is no rebound and no guarding.  Musculoskeletal: Normal range of motion. She exhibits no edema or tenderness.  Lymphadenopathy:    She has no cervical adenopathy.  Neurological: She is alert and oriented to person, place, and time. She has normal reflexes. No cranial nerve deficit. She exhibits normal muscle tone. Coordination normal.  Skin: Skin is warm and dry. No rash noted. She is not diaphoretic. No erythema. No pallor.  Easy bruising left forearm  Psychiatric: She has a normal mood and affect. Her behavior is normal. Judgment and thought content normal.  Vitals reviewed.  Vitals:   01/13/17 0856  BP: 136/68  Pulse: 62  SpO2: 97%  Weight: 137 lb 3.2 oz (62.2 kg)  Height: 5\' 1"  (1.549 m)  Assessment:       ICD-9-CM ICD-10-CM   1. Alopecia 704.00 L65.9   2. Encounter for therapeutic drug monitoring V58.83 Z51.81        Plan:     Alopecia Improving after stopping methotrexate  Plan rN to add methotrexate to allergy list with 01/13/2017 - hair loss mild  Encounter for therapeutic drug monitoring Cough under control and without flare up off methotrexate x few months  Plan Stay off methotrexate due to allergy of hair loss Continue prednisone 5mg  per day (easy bruising side effect of prednisone - you are accepting)  Followup 9 months or sooner if needed    Dr. Brand Males, M.D., [facility].C.P Pulmonary and Critical Care Medicine Staff Physician [place]  Pulmonary and Critical Care Pager: [phone], If no answer or between  15:00h - 7:00h: call [phone]  01/13/2017 9:26 AM

## 2017-01-13 NOTE — Assessment & Plan Note (Signed)
Cough under control and without flare up off methotrexate x few months  Plan Stay off methotrexate due to allergy of hair loss Continue prednisone 5mg  per day (easy bruising side effect of prednisone - you are accepting)  Followup 9 months or sooner if needed

## 2017-01-13 NOTE — Progress Notes (Signed)
[name] Page 02/25/1931 [id number]   History:    83 y.o.  for annual gyn exam with no GYN complaints. Patient to years ago was diagnosed with sarcoidosis and is currently on prednisone. Her PCP Dr. Renita Papa has been doing her blood work and monitoring her bone density studies which she needs one this year. Patient had a right hip replacement in January 2018 in the left hip replacement in 2008 in the right the replacement in 2009. Patient is on no hormone replacement therapy. She reports normal colonoscopy in 2011 and no past history of any abnormal Pap smears.  Past medical history,surgical history, family history and social history were all reviewed and documented in the EPIC chart.  Gynecologic History No LMP recorded. Patient is postmenopausal. Contraception: post menopausal status Last Pap: Several years ago. Results were: normal Last mammogram: 2017. Results were: normal  Obstetric History OB History  Gravida Para Term Preterm AB Living  2 2 2     2   SAB TAB Ectopic Multiple Live Births          2    # Outcome Date GA Lbr Len/2nd Weight Sex Delivery Anes PTL Lv  2 Term     M Vag-Spont  N LIV  1 Term     M Vag-Spont  N LIV       ROS: A ROS was performed and pertinent positives and negatives are included in the history.  GENERAL: No fevers or chills. HEENT: No change in vision, no earache, sore throat or sinus congestion. NECK: No pain or stiffness. CARDIOVASCULAR: No chest pain or pressure. No palpitations. PULMONARY: No shortness of breath, cough or wheeze. GASTROINTESTINAL: No abdominal pain, nausea, vomiting or diarrhea, melena or bright red blood per rectum. GENITOURINARY: No urinary frequency, urgency, hesitancy or dysuria. MUSCULOSKELETAL: No joint or muscle pain, no back pain, no recent trauma. DERMATOLOGIC: No rash, no itching, no lesions. ENDOCRINE: No polyuria, polydipsia, no heat or cold intolerance. No recent change in weight. HEMATOLOGICAL: No anemia or easy  bruising or bleeding. NEUROLOGIC: No headache, seizures, numbness, tingling or weakness. PSYCHIATRIC: No depression, no loss of interest in normal activity or change in sleep pattern.     Exam: chaperone present  BP 130/72   Ht 5\' 1"  (1.549 m)   Wt 137 lb (62.1 kg)   BMI 25.89 kg/m   Body mass index is 25.89 kg/m.  General appearance : Well developed well nourished female. No acute distress HEENT: Eyes: no retinal hemorrhage or exudates,  Neck supple, trachea midline, no carotid bruits, no thyroidmegaly Lungs: Clear to auscultation, no rhonchi or wheezes, or rib retractions  Heart: Regular rate and rhythm, no murmurs or gallops Breast:Examined in sitting and supine position were symmetrical in appearance, no palpable masses or tenderness,  no skin retraction, no nipple inversion, no nipple discharge, no skin discoloration, no axillary or supraclavicular lymphadenopathy Abdomen: no palpable masses or tenderness, no rebound or guarding Extremities: no edema or skin discoloration or tenderness  Pelvic:  Bartholin, Urethra, Skene Glands: Within normal limits             Vagina: No gross lesions or discharge, atrophic changes  Cervix: No gross lesions or discharge  Uterus  anteverted, normal size, shape and consistency, non-tender and mobile  Adnexa  Without masses or tenderness  Anus and perineum  normal   Rectovaginal  normal sphincter tone without palpated masses or tenderness             Hemoccult  PCP provides     Assessment/Plan:  83 y.o. female for annual exam with history of osteopenia on chronic steroid will check with her PCP as to schedule her next bone density study. We discussed importance of calcium and vitamin D and weightbearing exercises for osteoporosis prevention. Pap smear not indicated. PCP is been doing her blood work. Patient will need a mammogram later this year.   Terrance Mass MD, 11:29 AM 01/13/2017

## 2017-01-22 ENCOUNTER — Other Ambulatory Visit: Payer: Self-pay | Admitting: Emergency Medicine

## 2017-01-22 MED ORDER — FLUTICASONE PROPIONATE 50 MCG/ACT NA SUSP: 2.0000 | Freq: Every day | NASAL | 1 refills | Status: DC

## 2017-04-14 ENCOUNTER — Other Ambulatory Visit: Payer: Self-pay | Admitting: Internal Medicine

## 2017-06-06 ENCOUNTER — Ambulatory Visit (HOSPITAL_COMMUNITY)
Admission: EM | Admit: 2017-06-06 | Discharge: 2017-06-06 | Disposition: A | Discharge status: Home / Self Care | Payer: Medicare Other | Attending: Family Medicine | Admitting: Family Medicine

## 2017-06-06 ENCOUNTER — Encounter (HOSPITAL_COMMUNITY): Payer: Self-pay | Admitting: *Deleted

## 2017-06-06 DIAGNOSIS — J029 Acute pharyngitis, unspecified: Secondary | ICD-10-CM

## 2017-06-06 HISTORY — DX: Sarcoidosis, unspecified: D86.9

## 2017-06-06 MED ORDER — PENICILLIN V POTASSIUM 500 MG PO TABS: 500.0000 mg | ORAL_TABLET | Freq: Three times a day (TID) | ORAL | 0 refills | Status: DC

## 2017-06-06 NOTE — Discharge Instructions (Signed)
Your right tonsillar area has a small yellow well formed cystic lesion with surrounding redness suggesting an infected cyst.  If you don't respond to the penicillin by Monday morning, please call the ENT office for further evaluation.

## 2017-06-06 NOTE — ED Triage Notes (Signed)
C/O right jaw and right ear pain x 4 days.  Saw dentist 3 days ago - determined no dental problems.  Also c/o head congestion.  Denies fevers.

## 2017-06-06 NOTE — ED Provider Notes (Signed)
[place]   [id number] 06/06/17 Arrival Time: 1228   SUBJECTIVE:  [name] Page is a 83 y.o. female who presents to the urgent care with complaint of right jaw and right ear pain x 4 days.  Saw dentist 3 days ago - determined no dental problems.  Also c/o head congestion.  Denies fevers. 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 Arthritis   . Cervical spondylosis    C3-4 AND C4-5  . Chest pain 07/28/2008   H/O, normal stress nuclear EF 78%  . Collapsed lung  12/11   being treated at [facility] - in left lung  . Complication of anesthesia   . Dislocation closed, shoulder 7/14  . DVT (deep venous thrombosis) ([place]) 2006  . GERD (gastroesophageal reflux disease)    occ  . Headache    hx  . High cholesterol   . Hypertension   . Hypothyroidism   . Peripheral vascular disease ([place]) 2006   dvt's and 50 yrs ago  . PONV (postoperative nausea and vomiting)   . Sarcoidosis   . Sarcoidosis of lung ([place])    Family History  Problem Relation Age of Onset  . Diabetes Mother   . Heart failure Mother   . Hypertension Father   . Diabetes Father   . Heart disease Father   . Heart attack Father    Social History   Social History  . Marital status: Married    Spouse name: N/A  . Number of children: N/A  . Years of education: N/A   Occupational History  . retired Retired   Social History Main Topics  . Smoking status: Never Smoker  . Smokeless tobacco: Never Used  . Alcohol use Yes     Comment: BEER/glass of wine A DAY  . Drug use: No  . Sexual activity: No   Other Topics Concern  . Not on file   Social History Narrative  . No narrative on file   Current Meds  Medication Sig  . amLODipine (NORVASC) 10 MG tablet Take 10 mg by mouth daily.  Marland Kitchen azelastine (OPTIVAR) 0.05 % ophthalmic solution Place 1 drop into both eyes 2 (two) times daily.   . fluticasone (FLONASE) 50 MCG/ACT nasal spray Place  2 sprays into both nostrils daily.  Marland Kitchen gemfibrozil (LOPID) 600 MG tablet Take 600 mg by mouth 2 (two) times daily before a meal.  . levothyroxine (SYNTHROID, LEVOTHROID) 25 MCG tablet Take 25 mcg by mouth daily.   Marland Kitchen LORazepam (ATIVAN) 0.5 MG tablet Take 0.5 mg by mouth every 8 (eight) hours as needed for anxiety or sleep.   Marland Kitchen losartan (COZAAR) 50 MG tablet Take 50 mg by mouth daily.  . Multiple Vitamin (MULTIVITAMIN) capsule Take 1 capsule by mouth daily.  Marland Kitchen omeprazole (PRILOSEC) 20 MG capsule Take 20 mg by mouth daily as needed (heartburn).   . Polyethylene Glycol 3350 (GLYCOLAX PO) Take by mouth.  . predniSONE (DELTASONE) 5 MG tablet TAKE 1 TABLET BY MOUTH EVERY DAY WITH BREAKFAST  . UNABLE TO FIND Med Name: CA 2- 600mg  qd   Allergies  Allergen Reactions  . Methotrexate Derivatives     Mild alopecia       ROS: As per HPI, remainder of ROS negative.   OBJECTIVE:   Vitals:   06/06/17 1308  BP: (!) 145/58  Pulse: 75  Resp: 18  Temp: 98 F (36.7 C)  SpO2:  99%     General appearance: alert; no distress Eyes: PERRL; EOMI; conjunctiva normal HENT: normocephalic; atraumatic; TMs normal, canal normal, external ears normal without trauma; nasal mucosa normal; redness right tonsillar area with small yellow well-formed cystic mass behind. Neck: supple, no adenopathy Back: no CVA tenderness Extremities: no cyanosis or edema; symmetrical with no gross deformities Skin: warm and dry Neurologic: normal gait; grossly normal Psychological: alert and cooperative; normal mood and affect      Labs:  Results for orders placed or performed during the hospital encounter of 03/54/65  Basic metabolic panel  Result Value Ref Range   Sodium 135 135 - 145 mmol/L   Potassium 4.4 3.5 - 5.1 mmol/L   Chloride 103 101 - 111 mmol/L   CO2 27 22 - 32 mmol/L   Glucose, Bld 117 (H) 65 - 99 mg/dL   BUN 6 6 - 20 mg/dL   Creatinine, Ser 0.53 0.44 - 1.00 mg/dL   Calcium 8.6 (L) 8.9 - 10.3 mg/dL    GFR calc non Af Amer >60 >60 mL/min   GFR calc Af Amer >60 >60 mL/min   Anion gap 5 5 - 15  CBC  Result Value Ref Range   WBC 6.4 4.0 - 10.5 K/uL   RBC 2.72 (L) 3.87 - 5.11 MIL/uL   Hemoglobin 9.5 (L) 12.0 - 15.0 g/dL   HCT 27.4 (L) 36.0 - 46.0 %   MCV 100.7 (H) 78.0 - 100.0 fL   MCH 34.9 (H) 26.0 - 34.0 pg   MCHC 34.7 30.0 - 36.0 g/dL   RDW 14.5 11.5 - 15.5 %   Platelets 188 150 - 400 K/uL    Labs Reviewed - No data to display  No results found.     ASSESSMENT & PLAN:  1. Acute pharyngitis, unspecified etiology     Meds ordered this encounter  Medications  . Polyethylene Glycol 3350 (GLYCOLAX PO)    Sig: Take by mouth.  . losartan (COZAAR) 50 MG tablet    Sig: Take 50 mg by mouth daily.  Marland Kitchen UNABLE TO FIND    Sig: Med Name: CA 2- 600mg  qd  . Multiple Vitamin (MULTIVITAMIN) capsule    Sig: Take 1 capsule by mouth daily.  . penicillin v potassium (VEETID) 500 MG tablet    Sig: Take 1 tablet (500 mg total) by mouth 3 (three) times daily.    Dispense:  30 tablet    Refill:  0    Reviewed expectations re: course of current medical issues. Questions answered. Outlined signs and symptoms indicating need for more acute intervention. Patient verbalized understanding. After Visit Summary given.    Procedures:      Robyn Haber, MD 06/06/17 1334

## 2017-06-12 DIAGNOSIS — R6884 Jaw pain: Secondary | ICD-10-CM | POA: Insufficient documentation

## 2017-07-06 ENCOUNTER — Other Ambulatory Visit: Payer: Self-pay | Admitting: Internal Medicine

## 2017-07-08 ENCOUNTER — Telehealth: Payer: Self-pay | Admitting: Internal Medicine

## 2017-07-08 NOTE — Telephone Encounter (Signed)
Called and spoke to [place], [place]. [name] Page wanted to make MR aware that he wishes to prescribe a pred taper for sciatica. Taper will start with 50mg  and decreased by 10mg  every day until complete for pt's sciatica.   MR please advise. Thanks.

## 2017-07-08 NOTE — Telephone Encounter (Signed)
That is fine 

## 2017-07-08 NOTE — Telephone Encounter (Signed)
Taper will start with 50mg  and decreased by 10mg  every day until complete

## 2017-07-08 NOTE — Telephone Encounter (Signed)
That is fine. When is stop date? For how long is the pred?  Dr. Brand Males, M.D., [facility].C.P Pulmonary and Critical Care Medicine Staff Physician, [place] Director - Interstitial Lung Disease  Program  Pulmonary [place] at [place], [place], [address]  Pager: [phone], If no answer or between  15:00h - 7:00h: call [phone] Telephone: [phone]

## 2017-07-09 NOTE — Telephone Encounter (Signed)
Called and spoke with Bethena Roys at [facility] ortho and left message to make Sandria Manly aware of below message. Nothing further is needed at this time.

## 2017-10-07 ENCOUNTER — Other Ambulatory Visit: Payer: Self-pay | Admitting: Internal Medicine

## 2017-10-15 DIAGNOSIS — M25519 Pain in unspecified shoulder: Secondary | ICD-10-CM | POA: Insufficient documentation

## 2017-11-10 ENCOUNTER — Encounter: Payer: Self-pay | Admitting: Internal Medicine

## 2017-11-10 ENCOUNTER — Ambulatory Visit: Payer: Medicare Other | Admitting: Internal Medicine

## 2017-11-10 VITALS — BP 132/64 | HR 62 | Ht 61.0 in | Wt 132.4 lb

## 2017-11-10 DIAGNOSIS — D869 Sarcoidosis, unspecified: Secondary | ICD-10-CM | POA: Diagnosis not present

## 2017-11-10 DIAGNOSIS — J9819 Other pulmonary collapse: Secondary | ICD-10-CM

## 2017-11-10 NOTE — Progress Notes (Signed)
Subjective:     Patient ID: [name] Page, female   DOB: [birth year], 84 y.o.   MRN: [id number]  HPI   OV 08/24/2014  Chief Complaint  Patient presents with  . Follow-up    Pt stated the gabapentin has not changed her breathing or cough. Pt c/o prod cough with white mucus. Pt denies SOB and CP/tightness.    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 Main symptom is severe chronic cough with high RSI score suggestive of irritable larynx syndrome that is associated as well  She always finds prednisone to help the cough but due to age and side effects despite good functional status she opts to take a low-dose of prednisone. Currently she is on 5 mg prednisone. In November 2015 due to RSI score of 27 for cough but decided to trial gabapentin to tackle the neurogenic component of cough. She reports now that the gabapentin has not helped her at all although objectively the RSI cough score has drop to 20. She is very frustrated. She and her husband are reluctant to have right middle lobectomy given her advanced age despite good functional status. There are no open to trying methotrexate to tackle the inflammation of sarcoidosis and as a steroid sparing agent. She also wants to come off gabapentin   OV 10/02/2014  Chief Complaint  Patient presents with  . Follow-up    Pt states her breathing is doing well. Pt states her SOB has improved since last OV. Pt c/o mild cough. Pt denies CP/tightness. Pt stated being on methotrexate has significantly helped.     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 Main symptom is severe chronic cough with high RSI score suggestive of irritable larynx syndrome that is associated as well    Last visit in January 2016 she told me that gabapentin did not help her cough. So we stopped this. We started methotrexate for inflammation in her airway due to  sarcoid  causing right middle lobe collapse. We left her on prednisone 5 mg per day and started her on methotrexate 5 mg once a week. Also gave Hycodan cough syrup. She says that the Hycodan cough syrup did not help However, 2 weeks after starting her methotrexate she started noticing dramatic improvement in her cough. She hardly coughs now. She has significant improvement in her quality of life and she is extremely happy.  RSI cough score had dropped from 25 to 20 with gabapentin has now dropped to 5.  She is asking questions about how long to take methotrexate. And also review of the side effect profile. Of note I did notice that she is on ACE inhibitor that can potentially provoke all make her cough worse. She is not taking gabapentin anymore. There is no fever or chills. At this point in time she's not interested in Bactrim PCP prophylaxis which should be okay given low-dose of methotrexate and prednisone  Past, Family, Social reviewed: no change since last visit    OV 01/02/2015  Chief Complaint  Patient presents with  . Follow-up    Pt recently went to opthamologist and pts vision has decreased since last OV. Pt stated her breathing is unchanged since last OV. Pt c/o mild dry cough. Pt denies CP/tightness.     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 Main symptom is severe chronic cough with high RSI score suggestive of irritable larynx syndrome  that is associated as well  Cough /Saarcoid / IRritiable LArynx/ RML syundrome - cough persists but is only mild and baseline. No problems. Does not feel it impacts qualkity of life. She is off ace inhibitor now  ; forgot to mention this during med reconciliation. No asociated wheeze or dyspnea. Maintained on mehtotrxate 5mg  weekly, folic acid and prednisone 5mg  daily. No evidence of infection. Last LFT was 3 months ago and normal. Hycodan helping cough and wants refill  NEw issue - her eye doc Dr Ellie Lunch  called yesterday. PAtient has no complaints of vision but post capsulootmy few week ago and now yesterday visual field deficit on Rt side is worse. Concern is ocular sarcoid is worse. They are referring her to [place]. PAtient does not want step up Rx of sarcoid till all this is sorted out  New issue - noticed some bruising in left forearm yesterday sponatenous x 2 spots. New. Asytmptomatcic and last 1 weeks some punctate red lesion in distal 1/3 of leg. Associatd with gardening but denies poison ivy. Unchanged since insidious onset. No associatd fever.    OV 04/09/2015  Chief Complaint  Patient presents with  . Follow-up    Pt denies cough, SOB, CP/tightness. Pt states her breathing is doing well. Pt states she had a recent rash on BIL lower extremities and saw derm and was given a topical ointment that helped. Per pt the derm thought the rash was not related to sarcoid.     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 Main symptom is severe chronic cough with high RSI score suggestive of irritable larynx syndrome that is associated as well   Cough /Saarcoid / IRritiable LArynx/ RML syundrome - cough is significantly improved. RSI cough score is only 14. She feels that the cough is at a stage where it is completely fine and she can handle things. She is maintained on methotrexate 5 mg weekly, folic acid and prednisone 5 mg daily. She did have some lowish white count after last visit and anemia but we held to the methotrexate and it seems to be stable. She also had a rash at the time of last visit she has seen dermatology and apparently the biopsies do not show this is due to sarcoid. I personally review this outside chart and confirmed that. Of note her last chest x-ray was November 2015 which showed continued right middle lobe collapse. The cough is controlled with the methotrexate and prednisone. She is no longer on ACE inhibitor even though our MAR  reports that Also noted that she reports some mild weight gain with prednisone but she says she will try dietary measures to shake this weight off. She does not want to cough to come and therefore she is content continue with the methotrexate and prednisone. In terms of her vision she is [facility] in October 2016.    OV 07/16/2015  Chief Complaint  Patient presents with  . Follow-up    Pt states her breathing is unchanged but her cough has worsened. Pt states her SOB is at her baseline. Pt states her cough is nonproductive and worse in the evening. Pt denies CP/tightness.     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 Main symptom is severe chronic cough with high RSI score suggestive of irritable larynx syndrome that is associated as well   Cough /Saarcoid / IRritiable LArynx/ RML syundrome -she now  reports that her cough is worse for the last few months especially since last visit. RSI cough score is 18 and subjectively she feels that cough is worse by 25%. Otherwise she is okay. Cough is associated with worsening postnasal drip. She is also asking for refills on a cough syrup methotrexate folic acid. She prefers to have 90 day refills. Despite cough worsened she is not too keen on gabapentin therapy and wants to try other simpler measures at this point in time. There is no fever or sputum production that is associated with the cough.  In terms of her ophthalmology issues she has been reassured apparently it is a left optic now that is problematic in this 50% gone. No active follow-up.   OV 10/18/2015   Chief Complaint  Patient presents with  . Follow-up    Pt states her cough has significantly improved. Pt denies SOB and CP/tightness. Pt states overall she feels she is doing well.     Follow-up chronic cough associated with right middle lobe syndrome secondary to sarcoidosis and irritable larynx  syndrome  Last visit December 2016. At the time cough that deteriorated. The significant amount of postnasal drip. We instituted nasal steroids and with this cough is significantly improved and back to baseline. RSI cough score is 10 which is around her baseline. She feels immensely better. She is in a good phase of her health. There are no new issues. She is pending $24 and over-the-counter nasal steroid and we discussed about doing a generic prescription which might be cheaper. She wants refill postdated on her cough syrup. She wants 90 day refills on her methotrexate. She is happy with having a next follow-up in 6 months.   OV 04/22/2016  Chief Complaint  Patient presents with  . Follow-up    Pt states her breathing is at baseline. Pt states she is doing well. Pt states has a rare cough, not needing cough syrup any longer. Pt denies CP/tightness and f/c/s.      follow-up chronic cough associated with right middle lobe syndrome secondary to sarcoidosis [also ocular sarcoid] and irritable larynx syndrome  Last visit was me was in March 2017. In the interim she has visited Designer, jewellery. She is on prednisone 5 mg per day and also methotrexate 5 mg once a week.Her cough is significantly improved. She had a chest x-ray May 2017 that I personally visualized shows significant improvement in her right middle lobe laps compared to 2011/2012. She's not on the needing cough syrup. She has right hip DJD now and apparently hip surgery might be contemplated. She is using nasal ster  oid. She does not do Bactrim prophylaxis. RSI cough score show significant improvement with score of 6 which is only mild cough.   OV 10/28/2016  Chief Complaint  Patient presents with  . Follow-up    sarcoidosis, wants to discuss hair loss    Fu Chronic cough due to right middle lobe syndrome due to sarcoidosis with associated with ocular sarcoidosis. On methotrexate 5 mg once weekly associated with prednisone low  dose 4 mg/5 mg   She continues to do well. Cough is improved 1 RSI cough score 5 is documented below. In January 2008 and she had replacement and she had a clear chest x-ray that I personally visualized. The cough is only mild. She has a new problem in the last 6 months of mild alopecia that is persistent and stable. Not associated with any pain or radiation or aggravating or relieving factors.  It is associated with methotrexate intake. She wants to know about taking Biotene supplements that was advised by the hairdresser. Husband is wondering if methotrexate is the etiology for this. Methotrexate can cause less than 10% alopecia not otherwise specified.   OV 01/13/2017  Chief Complaint  Patient presents with  . Follow-up    Pt states her breathing is baseline. Pt states she has weaned off the methotrexate. Pt states she has mild dry cough. Pt denies CP/tightness.     84 year old female follow-up sarcoidosis associated with severe chronic cough right middle lobe syndrome and associated ocular sarcoidosis. Previously on methotrexate but low-dose prednisone currently.  Last visit in March 2018 because of good control cough and new onset of alopecia has mild he stopped the methotrexate. After this she is here for follow-up. She says that her cough has not gotten worse in the absence of methotrexate. Of note methotrexate was the only agent to improve her cough a few years ago. Her alopecia is improving after stopping the methotrexate. She strongly believes the methotrexate as the cause of alopecia and is agreeable for methotrexate to be listed in her allergies. She is continuing 5 mg of redness on her day. She plans to continue this because of ocular sarcoidosis as well. Her weight is causing some easy bruising which she is accepting. Today RSI cough score is only 3 and shows good control of cough    OV 11/10/2017  Chief Complaint  Patient presents with  . Follow-up    Pt states she has been doing  well since last visit and denies any complaints or concerns.     84 year old female follow-up sarcoidosis associated with severe chronic cough right middle lobe syndrome and associated ocular sarcoidosis. Previously on methotrexate as of 2018 but low-dose prednisone currently through 2019. LAsT CXR Jan 2018 - clear   Follow-up chronic cough related to the above. She is now off methotrexate for over a year. Her alopecia has resolved. Her hair is back. She feels happy. She is on 5 mg prednisone a day. She feels well. Cough is minimal to nonexistent. She has ophthalmology appointment pending. I thought the current prednisone is because of her ocular sarcoid but she thinks it might be because of pulmonary and ocular sarcoid. She is willing to taper her prednisone down to 4 mg per day. Her husband is here with her as always. She is up-to-date with flu shot.She feels well without any respiratory symptoms of fatigue. No new medical problems or ER visits  Dr Lorenza [place] Reflux Symptom Index (> 13-15 suggestive of LPR cough) Dec 2016 10/18/2015  04/22/2016  10/28/2016  01/13/2017   Hoarseness of problem with voice 2 3 3 1 1   Clearing  Of Throat 3 2 1 1  0  Excess throat mucus or feeling of post nasal drip 1 1 0 1 0  Difficulty swallowing food, liquid or tablets 2 1 0 1 0  Cough after eating or lying down 3 0 0 0 0  Breathing difficulties or choking episodes 1 0 0 0 2  Troublesome or annoying cough 3 1 0 0 0  Sensation of something sticking in throat or lump in throat 0 1 0 0 0  Heartburn, chest pain, indigestion, or stomach acid coming up 3 1 2 1 1   TOTAL 18 10 6 5 3         has a past medical history of Arthritis, Cervical spondylosis, Chest pain (07/28/2008), Collapsed lung ( 30/16), Complication of anesthesia, Dislocation closed, shoulder (  7/14), DVT (deep venous thrombosis) ([place]) (2006), GERD (gastroesophageal reflux disease), Headache, High cholesterol, Hypertension, Hypothyroidism, Peripheral vascular  disease ([place]) (2006), PONV (postoperative nausea and vomiting), Sarcoidosis, and Sarcoidosis of lung ([place]).   reports that she has never smoked. She has never used smokeless tobacco.  Past Surgical History:  Procedure Laterality Date  . EYE SURGERY    . FOOT SURGERY Left 06/2005   bunion hammer toe  . HEMORRHOID SURGERY  12/1972  . INTRAOCULAR LENS INSERTION     right eye 10 /16 and left 06/16/2005  . JOINT REPLACEMENT    . SKIN BIOPSY  01/2007   basal cell carcinoma removed from right lower leg   . TOTAL HIP ARTHROPLASTY  06/2007   left  . TOTAL HIP ARTHROPLASTY Right 08/26/2016   Procedure: TOTAL HIP ARTHROPLASTY ANTERIOR APPROACH;  Surgeon: Melrose Nakayama, MD;  Location: [facility];  Service: Orthopedics;  Laterality: Right;  . TOTAL KNEE ARTHROPLASTY Right 10/2007   right    Allergies  Allergen Reactions  . Methotrexate Derivatives     Mild alopecia     Immunization History  Administered Date(s) Administered  . DTaP 12/10/1995  . Hepatitis A 12/10/1999  . Influenza Split 04/11/2013, 04/11/2014  . Influenza Whole 04/15/2010  . Influenza,inj,Quad PF,6+ Mos 05/07/2015, 04/11/2016  . Influenza-Unspecified 05/09/2017  . Pneumococcal Polysaccharide-23 05/13/2004  . Pneumococcal-Unspecified 08/11/2010, 03/11/2014  . Td 02/08/2002  . Tdap 08/11/2012  . Zoster 06/12/2007    Family History  Problem Relation Age of Onset  . Diabetes Mother   . Heart failure Mother   . Hypertension Father   . Diabetes Father   . Heart disease Father   . Heart attack Father      Current Outpatient Medications:  .  amLODipine (NORVASC) 10 MG tablet, Take 10 mg by mouth daily., Disp: , Rfl:  .  azelastine (OPTIVAR) 0.05 % ophthalmic solution, Place 1 drop into both eyes 2 (two) times daily. , Disp: , Rfl:  .  fluticasone (FLONASE) 50 MCG/ACT nasal spray, Place 2 sprays into both nostrils daily., Disp: 48 g, Rfl: 1 .  folic acid (FOLVITE) 1 MG tablet, TAKE 1 TABLET BY MOUTH EVERY DAY, Disp: 90  tablet, Rfl: 0 .  gemfibrozil (LOPID) 600 MG tablet, Take 600 mg by mouth 2 (two) times daily before a meal., Disp: , Rfl:  .  levothyroxine (SYNTHROID, LEVOTHROID) 25 MCG tablet, Take 25 mcg by mouth daily. , Disp: , Rfl:  .  LORazepam (ATIVAN) 0.5 MG tablet, Take 0.5 mg by mouth every 8 (eight) hours as needed for anxiety or sleep. , Disp: , Rfl:  .  losartan (COZAAR) 50 MG tablet, Take 50 mg by mouth daily., Disp: , Rfl:  .  Multiple Vitamin (MULTIVITAMIN) capsule, Take 1 capsule by mouth daily., Disp: , Rfl:  .  omeprazole (PRILOSEC) 20 MG capsule, Take 20 mg by mouth daily as needed (heartburn). , Disp: , Rfl: 7 .  penicillin v potassium (VEETID) 500 MG tablet, Take 1 tablet (500 mg total) by mouth 3 (three) times daily., Disp: 30 tablet, Rfl: 0 .  Polyethylene Glycol 3350 (GLYCOLAX PO), Take by mouth., Disp: , Rfl:  .  predniSONE (DELTASONE) 5 MG tablet, TAKE 1 TABLET BY MOUTH EVERY DAY WITH BREAKFAST, Disp: 90 tablet, Rfl: 3 .  UNABLE TO FIND, Med Name: CA 2- 600mg  qd, Disp: , Rfl:    Review of Systems     Objective:   Physical Exam  Constitutional: She is oriented to person,  place, and time. She appears well-developed and well-nourished. No distress.  HENT:  Head: Normocephalic and atraumatic.  Right Ear: External ear normal.  Left Ear: External ear normal.  Mouth/Throat: Oropharynx is clear and moist. No oropharyngeal exudate.  Hair back fully   Eyes: Pupils are equal, round, and reactive to light. Conjunctivae and EOM are normal. Right eye exhibits no discharge. Left eye exhibits no discharge. No scleral icterus.  Neck: Normal range of motion. Neck supple. No JVD present. No tracheal deviation present. No thyromegaly present.  Cardiovascular: Normal rate, regular rhythm, normal heart sounds and intact distal pulses. Exam reveals no gallop and no friction rub.  No murmur heard. Pulmonary/Chest: Effort normal and breath sounds normal. No respiratory distress. She has no wheezes.  She has no rales. She exhibits no tenderness.  Abdominal: Soft. Bowel sounds are normal. She exhibits no distension and no mass. There is no tenderness. There is no rebound and no guarding.  Musculoskeletal: Normal range of motion. She exhibits no edema or tenderness.  Lymphadenopathy:    She has no cervical adenopathy.  Neurological: She is alert and oriented to person, place, and time. She has normal reflexes. No cranial nerve deficit. She exhibits normal muscle tone. Coordination normal.  Skin: Skin is warm and dry. No rash noted. She is not diaphoretic. No erythema. No pallor.  Psychiatric: She has a normal mood and affect. Her behavior is normal. Judgment and thought content normal.  Vitals reviewed.  Vitals:   11/10/17 0958  BP: 132/64  Pulse: 62  SpO2: 98%  Weight: 132 lb 6.4 oz (60.1 kg)  Height: 5\' 1"  (1.549 m)      Estimated body mass index is 25.02 kg/m as calculated from the following:   Height as of this encounter: 5\' 1"  (1.549 m).   Weight as of this encounter: 132 lb 6.4 oz (60.1 kg).     Assessment:     1. Sarcoidosis  2. Right middle lobe syndrome     Plan:       Glad doing well with minimal to no cough Last CXR Jan 2018 without collapse of right middle lobe Glad hair back off methotrexate  Plan Can reduce prednisone to 4mg  per day after d/w eye doctor Please talk to PCP Burnard Bunting, MD -  and ensure you get  shingarix vaccine   Followup If cough comes back, please call/return for visit Otherwise, followup 1 year or sooner  Dr. Brand Males, M.D., [facility]C.P Pulmonary and Critical Care Medicine Staff Physician, [place] Director - Interstitial Lung Disease  Program  Pulmonary [place] [facility] [place], [place], [address]  Pager: [phone], If no answer or between  15:00h - 7:00h: call [phone] Telephone: [phone]

## 2017-11-10 NOTE — Patient Instructions (Signed)
ICD-10-CM   1. Sarcoidosis D86.9   2. Right middle lobe syndrome J98.19     Glad doing well with minimal to no cough Last CXR Jan 2018 without collapse of right middle lobe Glad hair back off methotrexate  Plan Can reduce prednisone to 4mg  per day after d/w eye doctor Please talk to PCP Burnard Bunting, MD -  and ensure you get  shingarix vaccine   Followup If cough comes back, please call/return for visit Otherwise, followup 1 year or sooner

## 2017-12-31 ENCOUNTER — Other Ambulatory Visit: Payer: Self-pay | Admitting: *Deleted

## 2017-12-31 MED ORDER — FOLIC ACID 1 MG PO TABS: 1.0000 mg | ORAL_TABLET | Freq: Every day | ORAL | 1 refills | Status: DC

## 2018-01-01 ENCOUNTER — Telehealth: Payer: Self-pay | Admitting: Internal Medicine

## 2018-01-01 MED ORDER — FOLIC ACID 1 MG PO TABS: 1.0000 mg | ORAL_TABLET | Freq: Every day | ORAL | 1 refills | Status: DC

## 2018-01-01 NOTE — Telephone Encounter (Signed)
Called [facility] on [address] and informed them that Patient wanted Folic Acid prescription sent to another pharmacy.  Prescription for Folic Acid sent to preferred pharmacy, [facility].  Patient notified of change.  Nothing further needed at this time.

## 2018-02-10 ENCOUNTER — Ambulatory Visit (HOSPITAL_COMMUNITY)
Admission: EM | Admit: 2018-02-10 | Discharge: 2018-02-10 | Disposition: A | Discharge status: Home / Self Care | Payer: Medicare Other | Attending: Internal Medicine | Admitting: Internal Medicine

## 2018-02-10 ENCOUNTER — Encounter (HOSPITAL_COMMUNITY): Payer: Self-pay | Admitting: Emergency Medicine

## 2018-02-10 DIAGNOSIS — S51002A Unspecified open wound of left elbow, initial encounter: Secondary | ICD-10-CM

## 2018-02-10 DIAGNOSIS — T07XXXA Unspecified multiple injuries, initial encounter: Secondary | ICD-10-CM

## 2018-02-10 DIAGNOSIS — W19XXXA Unspecified fall, initial encounter: Secondary | ICD-10-CM

## 2018-02-10 DIAGNOSIS — S51012A Laceration without foreign body of left elbow, initial encounter: Secondary | ICD-10-CM

## 2018-02-10 MED ORDER — MUPIROCIN 2 % EX OINT: 1.0000 "application " | TOPICAL_OINTMENT | Freq: Two times a day (BID) | CUTANEOUS | 0 refills | Status: DC

## 2018-02-10 NOTE — Discharge Instructions (Addendum)
Please wash wounds twice daily with warm soapy water, dry really well after washing May apply Bactroban twice daily  Continue to cover wounds as they are healing  Please monitor for signs of infection, please return if developing redness, swelling, drainage or not healing as expected

## 2018-02-10 NOTE — ED Triage Notes (Signed)
Pt with trip and fall today c/o skin tears to arms and right shin

## 2018-02-11 NOTE — ED Provider Notes (Signed)
[facility]    CSN: [id number] Arrival date & time: 02/10/18  1509     History   Chief Complaint Chief Complaint  Patient presents with  . Fall    HPI [name] Page is a 84 y.o. female history of sarcoidosis, hypertension, hyperlipidemia presenting today for evaluation of skin tears.  Patient states that she was in [place] 2 days ago and was walking out of the sliding glass door, she tripped over the ledge at the base of the door and fell onto a brick patio.  She denies hitting head or loss of consciousness.  She denies vision changes, nausea, vomiting, chest pain or shortness of breath.  Her main concern is skin tears.  She has 1 to her right arm, right shin as well as her left elbow area.  She is concerned about the one on her left elbow.  They have been applying Neosporin as well as washing with peroxide.   Patient denies being on blood thinners, but states that she has had many treatments of prednisone which is caused her skin thinning.  HPI  Past Medical History:  Diagnosis Date  . Arthritis   . Cervical spondylosis    C3-4 AND C4-5  . Chest pain 07/28/2008   H/O, normal stress nuclear EF 78%  . Collapsed lung  12/11   being treated at [facility] - in left lung  . Complication of anesthesia   . Dislocation closed, shoulder 7/14  . DVT (deep venous thrombosis) ([place]) 2006  . GERD (gastroesophageal reflux disease)    occ  . Headache    hx  . High cholesterol   . Hypertension   . Hypothyroidism   . Peripheral vascular disease ([place]) 2006   dvt's and 50 yrs ago  . PONV (postoperative nausea and vomiting)   . Sarcoidosis   . Sarcoidosis of lung [facility])     Patient Active Problem List   Diagnosis Date Noted  . Alopecia 10/28/2016  . Primary localized osteoarthritis of right hip 08/26/2016  . Primary osteoarthritis of right hip 08/26/2016  . Pleuritic pain 12/17/2015  . Osteopenia 05/23/2015  . Cystocele 05/23/2015  . Rash and nonspecific skin eruption  01/02/2015  . Visual field defect nasal step 01/02/2015  . Encounter for therapeutic drug monitoring 10/03/2014  . Chronic cough 07/12/2014  . Nasal itching 01/30/2014  . 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 Sarcoidosis 01/30/2014  . Cystocele, midline 05/02/2013  . Chest pain   . History of recurrent UTIs 09/24/2011  . Vaginal atrophy 09/24/2011  . HYPERLIPIDEMIA 07/30/2010  . HYPERTENSION 07/30/2010  . DEEP VEIN THROMBOSIS/PHLEBITIS 07/30/2010  . PULMONARY COLLAPSE 07/30/2010  . PULMONARY NODULE 07/30/2010  . COUGH 07/30/2010    Past Surgical History:  Procedure Laterality Date  . EYE SURGERY    . FOOT SURGERY Left 06/2005   bunion hammer toe  . HEMORRHOID SURGERY  12/1972  . INTRAOCULAR LENS INSERTION     right eye 10 /16 and left 06/16/2005  . JOINT REPLACEMENT    . SKIN BIOPSY  01/2007   basal cell carcinoma removed from right lower leg   . TOTAL HIP ARTHROPLASTY  06/2007   left  . TOTAL HIP ARTHROPLASTY Right 08/26/2016   Procedure: TOTAL HIP ARTHROPLASTY ANTERIOR APPROACH;  Surgeon: Melrose Nakayama, MD;  Location: [facility];  Service: Orthopedics;  Laterality: Right;  . TOTAL KNEE ARTHROPLASTY Right 10/2007   right    OB History    Gravida  2  Para  2   Term  2   Preterm      AB      Living  2     SAB      TAB      Ectopic      Multiple      Live Births  2            Home Medications    Prior to Admission medications   Medication Sig Start Date End Date Taking? Authorizing Provider  amLODipine (NORVASC) 10 MG tablet Take 10 mg by mouth daily.    [provider]  azelastine (OPTIVAR) 0.05 % ophthalmic solution Place 1 drop into both eyes 2 (two) times daily.     [provider]  fluticasone (FLONASE) 50 MCG/ACT nasal spray Place 2 sprays into both nostrils daily. 01/22/17   Brand Males, MD  folic acid (FOLVITE) 1 MG tablet Take 1 tablet (1 mg total) by mouth daily. 01/01/18   Brand Males, MD  gemfibrozil  (LOPID) 600 MG tablet Take 600 mg by mouth 2 (two) times daily before a meal.    [provider]  levothyroxine (SYNTHROID, LEVOTHROID) 25 MCG tablet Take 25 mcg by mouth daily.     [provider]  LORazepam (ATIVAN) 0.5 MG tablet Take 0.5 mg by mouth every 8 (eight) hours as needed for anxiety or sleep.  03/26/15   [provider]  losartan (COZAAR) 50 MG tablet Take 50 mg by mouth daily.    [provider]  Multiple Vitamin (MULTIVITAMIN) capsule Take 1 capsule by mouth daily.    [provider]  mupirocin ointment (BACTROBAN) 2 % Apply 1 application topically 2 (two) times daily. 02/10/18   Lexia Vandevender C, PA-C  omeprazole (PRILOSEC) 20 MG capsule Take 20 mg by mouth daily as needed (heartburn).  05/02/15   [provider]  penicillin v potassium (VEETID) 500 MG tablet Take 1 tablet (500 mg total) by mouth 3 (three) times daily. 06/06/17   Robyn Haber, MD  Polyethylene Glycol 3350 (GLYCOLAX PO) Take by mouth.    [provider]  predniSONE (DELTASONE) 5 MG tablet TAKE 1 TABLET BY MOUTH EVERY DAY WITH BREAKFAST 04/14/17   Brand Males, MD  UNABLE TO FIND Med Name: CA 2- 600mg  qd    [provider]    Family History Family History  Problem Relation Age of Onset  . Diabetes Mother   . Heart failure Mother   . Hypertension Father   . Diabetes Father   . Heart disease Father   . Heart attack Father     Social History Social History   Tobacco Use  . Smoking status: Never Smoker  . Smokeless tobacco: Never Used  Substance Use Topics  . Alcohol use: Yes    Comment: BEER/glass of wine A DAY  . Drug use: No     Allergies   Methotrexate derivatives   Review of Systems Review of Systems  Constitutional: Negative for fatigue and fever.  HENT: Negative for mouth sores.   Eyes: Negative for photophobia and visual disturbance.  Respiratory: Negative for shortness of breath.   Cardiovascular: Negative for  chest pain.  Gastrointestinal: Negative for abdominal pain, nausea and vomiting.  Genitourinary: Negative for genital sores.  Musculoskeletal: Negative for arthralgias and joint swelling.  Skin: Positive for color change and wound. Negative for rash.  Neurological: Negative for dizziness, syncope, speech difficulty, weakness, light-headedness and headaches.     Physical Exam Triage  Vital Signs ED Triage Vitals  Enc Vitals Group     BP 02/10/18 1528 (!) 171/62     Pulse Rate 02/10/18 1528 78     Resp 02/10/18 1528 16     Temp 02/10/18 1528 (!) 97.5 F (36.4 C)     Temp Source 02/10/18 1528 Oral     SpO2 02/10/18 1528 99 %     Weight --      Height --      Head Circumference --      Peak Flow --      Pain Score 02/10/18 1610 4     Pain Loc --      Pain Edu? --      Excl. in [place]? --    No data found.  Updated Vital Signs BP (!) 171/62 (BP Location: Right Arm)   Pulse 78   Temp (!) 97.5 F (36.4 C) (Oral)   Resp 16   SpO2 99%   Visual Acuity Right Eye Distance:   Left Eye Distance:   Bilateral Distance:    Right Eye Near:   Left Eye Near:    Bilateral Near:     Physical Exam  Constitutional: She is oriented to person, place, and time. She appears well-developed and well-nourished. No distress.  HENT:  Head: Normocephalic and atraumatic.  Mouth/Throat: Oropharynx is clear and moist.  Eyes: Pupils are equal, round, and reactive to light. Conjunctivae and EOM are normal.  Wearing glasses  Neck: Neck supple.  Cardiovascular: Normal rate and regular rhythm.  No murmur heard. Pulmonary/Chest: Effort normal and breath sounds normal. No respiratory distress.  Abdominal: Soft. There is no tenderness.  Musculoskeletal: She exhibits no edema.  Neurological: She is alert and oriented to person, place, and time. She displays normal reflexes. Coordination normal.  Skin: Skin is warm and dry.  Right anterior shin, small 1 cm area without overlying skin, not actively  bleeding, minimal surrounding erythema  Right forearm, small 1 cm skin tear with overlying skin, no exposed underlying tissue  Left forearm, see picture below, large area with exposed tissue, mild surrounding erythema just surrounding the border of the wound, mild bruising to elbow, no active drainage  Psychiatric: She has a normal mood and affect.  Nursing note and vitals reviewed.      UC Treatments / Results  Labs (all labs ordered are listed, but only abnormal results are displayed) Labs Reviewed - No data to display  EKG None  Radiology No results found.  Procedures Procedures (including critical care time)  Medications Ordered in UC Medications - No data to display  Initial Impression / Assessment and Plan / UC Course  I have reviewed the triage vital signs and the nursing notes.  Pertinent labs & imaging results that were available during my care of the patient were reviewed by me and considered in my medical decision making (see chart for details).     No focal neuro deficits after fall, exam unremarkable, vital signs stable.  Skin tears do not appear infected at this time.  They were washed with warm soapy water and dried.  Will provide Bactroban to apply twice daily, discussed wound care and monitoring for further signs of infection.Discussed strict return precautions. Patient verbalized understanding and is agreeable with plan.  Final Clinical Impressions(s) / UC Diagnoses   Final diagnoses:  Fall, initial encounter  Skin tear of left elbow without complication, initial encounter     Discharge Instructions     Please wash  wounds twice daily with warm soapy water, dry really well after washing May apply Bactroban twice daily  Continue to cover wounds as they are healing  Please monitor for signs of infection, please return if developing redness, swelling, drainage or not healing as expected   ED Prescriptions    Medication Sig Dispense Auth. Provider    mupirocin ointment (BACTROBAN) 2 % Apply 1 application topically 2 (two) times daily. 30 g Dessie Tatem, [place] C, PA-C     Controlled Substance Prescriptions [place] Controlled Substance Registry consulted? Not Applicable   Janith Lima, [place] 02/11/18 1338

## 2018-02-23 ENCOUNTER — Other Ambulatory Visit: Payer: Self-pay | Admitting: *Deleted

## 2018-02-23 MED ORDER — FLUTICASONE PROPIONATE 50 MCG/ACT NA SUSP: 2.0000 | Freq: Every day | NASAL | 5 refills | Status: DC

## 2018-03-17 ENCOUNTER — Telehealth: Payer: Self-pay | Admitting: Internal Medicine

## 2018-03-17 MED ORDER — PREDNISONE 2 MG PO TBEC
4.0000 mg | DELAYED_RELEASE_TABLET | Freq: Every day | ORAL | 2 refills | Status: DC
Start: 2018-03-17 — End: 2018-03-22

## 2018-03-17 NOTE — Telephone Encounter (Signed)
Called and spoke with Patient.  Patient stated that she had just finished her last 5mg  Prednisone tab.  She stated that she had discussed with her eye doctor about reducing dose.  She stated that eye doctor had stated that 4mg  Prednisone would be fine.  Prednisone refill sent to Patient's preferred pharmacy.  Nothing further at this time.  11/10/17 OV note-  Glad doing well with minimal to no cough Last CXR Jan 2018 without collapse of right middle lobe Glad hair back off methotrexate  Plan Can reduce prednisone to 4mg  per day after d/w eye doctor Please talk to PCP [name] Bunting, MD -  and ensure you get  shingarix vaccine   Followup If cough comes back, please call/return for visit

## 2018-03-22 ENCOUNTER — Other Ambulatory Visit: Payer: Self-pay | Admitting: *Deleted

## 2018-03-22 MED ORDER — PREDNISONE 1 MG PO TABS: 4.0000 mg | ORAL_TABLET | Freq: Every day | ORAL | 3 refills | Status: DC

## 2018-05-18 ENCOUNTER — Telehealth: Payer: Self-pay | Admitting: Internal Medicine

## 2018-05-18 DIAGNOSIS — D869 Sarcoidosis, unspecified: Secondary | ICD-10-CM

## 2018-05-18 NOTE — Telephone Encounter (Signed)
Pt is calling back [phone]

## 2018-05-18 NOTE — Telephone Encounter (Signed)
Called and spoke with pt to see if a CT has been scheduled.   Pt stated she has not been scheduled for a CT yet as she wanted to check with MR before she scheduled it. I have placed an order for the CT so that way MR can be able to see the results once scan has been completed.  Nothing further needed.

## 2018-05-18 NOTE — Telephone Encounter (Signed)
Spoke with pt who wanted to know if MR thought if it was necessary for pt to have a CT scan based on recent chest xray that was done by PCP.  MR, please advise on this for pt if you think it is necessary for her to have a CT scan based on the results of last xray that was done by PCP showing that the results were "unclear"  Thanks!

## 2018-05-18 NOTE — Telephone Encounter (Signed)
Called and spoke with patient, she stated that she had an annual physical with PCP she had a chest xray done and the results came back stating that it was "unclear" her PCP has suggested that she has a CT scan. She wanted to let MR know that she will be having a CT and will have the results sent over to him as well.   MR sending this as an FYI.

## 2018-05-18 NOTE — Telephone Encounter (Signed)
Please find out where she is having a CT scan and when the CT scan of the chest is ?  It might be better off depending on the location that we do the CT scan of the chest so I can personally visualize it

## 2018-05-21 ENCOUNTER — Telehealth: Payer: Self-pay | Admitting: Internal Medicine

## 2018-05-21 ENCOUNTER — Ambulatory Visit
Admission: RE | Admit: 2018-05-21 | Discharge: 2018-05-21 | Disposition: A | Discharge status: Home / Self Care | Payer: Medicare Other | Source: Ambulatory Visit | Attending: Internal Medicine | Admitting: Internal Medicine

## 2018-05-21 DIAGNOSIS — D869 Sarcoidosis, unspecified: Secondary | ICD-10-CM

## 2018-05-21 NOTE — Telephone Encounter (Signed)
  Ct scan shows some progrfessive findings since CT in 2011 - but not sure how it would b compared to [place]. So, pleae have her come in for followup to see me; first avail 30 min slot  Ct Chest Wo Contrast  Result Date: 05/21/2018 CLINICAL DATA:  Sarcoidosis. EXAM: CT CHEST WITHOUT CONTRAST TECHNIQUE: Multidetector CT imaging of the chest was performed following the standard protocol without IV contrast. COMPARISON:  07/23/2010 FINDINGS: Cardiovascular: The heart size is normal. No substantial pericardial effusion. Coronary artery calcification is evident. Atherosclerotic calcification is noted in the wall of the thoracic aorta. Mediastinum/Nodes: No mediastinal lymphadenopathy. Small mediastinal lymph nodes have subtle mineralization evident. Irregular Soft tissue fullness in the hilar regions is similar to prior scan. The esophagus has normal imaging features. There is no axillary lymphadenopathy. Lungs/Pleura: The central tracheobronchial airways are patent. 7 mm left upper lobe pulmonary nodule is stable in the interval. Interval development of numerous, right greater than left, irregular bilateral pulmonary nodules. There is diffuse Perilymphatic thickening and nodularity, right greater than left, progressive in the interval with nodular thickening of the right major fissure progressive since prior study. These changes are associated with complete collapse of the right middle lobe. No pleural effusion. Upper Abdomen: 15 mm calcified gallstone evident. Musculoskeletal: No worrisome lytic or sclerotic osseous abnormality. IMPRESSION: 1. Interval progression of bilateral perilymphatic thickening and nodularity, right greater than left. Imaging features are compatible with the patient's known history of sarcoidosis. 2. Interval development of complete collapse right middle lobe. Presumably also secondary to sarcoidosis and hilar involvement, but close follow-up recommended to exclude central obstructing  lesion un related to sarcoidosis. 3.  Aortic Atherosclerois (ICD10-170.0) Electronically Signed   By: Misty Stanley M.D.   On: 05/21/2018 09:54

## 2018-05-21 NOTE — Telephone Encounter (Signed)
Called pt and spoke with pt's spouse, [name] Page in regards to the CT results and stated to him we needed to get pt scheduled for an appt with MR 67min slot first avail  Pt was currently taking a nap so was unable to come to the phone to get the appt scheduled.  [name] Page stated he would have pt call our office first thing Monday morning to get the appt scheduled.  Will leave encounter open until I know pt has had a follow up appt scheduled.

## 2018-05-24 NOTE — Telephone Encounter (Signed)
Pt is calling back [phone]

## 2018-05-24 NOTE — Telephone Encounter (Signed)
Called and spoke with pt to schedule follow up OV for pt so MR could go over CT scan results.  Pt expressed understanding. appt has been scheduled for pt tomorrow, 10/15 at 3pm.  Nothing further needed.

## 2018-05-25 ENCOUNTER — Ambulatory Visit: Payer: Medicare Other | Admitting: Internal Medicine

## 2018-05-25 ENCOUNTER — Encounter: Payer: Self-pay | Admitting: Internal Medicine

## 2018-05-25 VITALS — BP 122/70 | HR 57 | Ht 62.0 in | Wt 138.8 lb

## 2018-05-25 DIAGNOSIS — J9819 Other pulmonary collapse: Secondary | ICD-10-CM

## 2018-05-25 DIAGNOSIS — D869 Sarcoidosis, unspecified: Secondary | ICD-10-CM | POA: Diagnosis not present

## 2018-05-25 NOTE — Patient Instructions (Signed)
ICD-10-CM   1. Sarcoidosis D86.9   2. Right middle lobe syndrome J98.19    Glad you are feeling well and asymptomatic and doing well on 4 mg prednisone and exercising  The CT scan of the chest is definitely worse since 2011 through 2019  However, for more accurate comparison will need to compare the chest x-ray from January 2018 in our system with a chest x-ray just had in the fall 2019 with the primary care physician  Another comparison would be to compare the [facility] CT chest from 2014 with October 2019 CT chest  Plan -Please sign release and get a CD-ROM of your recent chest x-ray from your primary care physician.  You can drop this off sometime in the next few weeks -Depending on these results we might try to get the CD-ROM from [facility] of the CT chest in 2014 -In 3 months do spirometry and DLCO  Follow-up -[facility] clinic in 3 months

## 2018-05-25 NOTE — Progress Notes (Signed)
OV 08/24/2014  Chief Complaint  Patient presents with  . Follow-up    Pt stated the gabapentin has not changed her breathing or cough. Pt c/o prod cough with white mucus. Pt denies SOB and CP/tightness.    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 Main symptom is severe chronic cough with high RSI score suggestive of irritable larynx syndrome that is associated as well  She always finds prednisone to help the cough but due to age and side effects despite good functional status she opts to take a low-dose of prednisone. Currently she is on 5 mg prednisone. In November 2015 due to RSI score of 27 for cough but decided to trial gabapentin to tackle the neurogenic component of cough. She reports now that the gabapentin has not helped her at all although objectively the RSI cough score has drop to 20. She is very frustrated. She and her husband are reluctant to have right middle lobectomy given her advanced age despite good functional status. There are no open to trying methotrexate to tackle the inflammation of sarcoidosis and as a steroid sparing agent. She also wants to come off gabapentin   OV 10/02/2014  Chief Complaint  Patient presents with  . Follow-up    Pt states her breathing is doing well. Pt states her SOB has improved since last OV. Pt c/o mild cough. Pt denies CP/tightness. Pt stated being on methotrexate has significantly helped.     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 Main symptom is severe chronic cough with high RSI score suggestive of irritable larynx syndrome that is associated as well    Last visit in January 2016 she told me that gabapentin did not help her cough. So we stopped this. We started methotrexate for inflammation in her airway due to  sarcoid causing right middle lobe collapse. We left her on prednisone 5 mg per day and started her on  methotrexate 5 mg once a week. Also gave Hycodan cough syrup. She says that the Hycodan cough syrup did not help However, 2 weeks after starting her methotrexate she started noticing dramatic improvement in her cough. She hardly coughs now. She has significant improvement in her quality of life and she is extremely happy.  RSI cough score had dropped from 25 to 20 with gabapentin has now dropped to 5.  She is asking questions about how long to take methotrexate. And also review of the side effect profile. Of note I did notice that she is on ACE inhibitor that can potentially provoke all make her cough worse. She is not taking gabapentin anymore. There is no fever or chills. At this point in time she's not interested in Bactrim PCP prophylaxis which should be okay given low-dose of methotrexate and prednisone  Past, Family, Social reviewed: no change since last visit    OV 01/02/2015  Chief Complaint  Patient presents with  . Follow-up    Pt recently went to opthamologist and pts vision has decreased since last OV. Pt stated her breathing is unchanged since last OV. Pt c/o mild dry cough. Pt denies CP/tightness.     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 Main symptom is severe chronic cough with high RSI score suggestive of irritable larynx syndrome that is associated as well  Cough /Saarcoid / IRritiable LArynx/ RML syundrome - cough persists but is only mild and  baseline. No problems. Does not feel it impacts qualkity of life. She is off ace inhibitor now  ; forgot to mention this during med reconciliation. No asociated wheeze or dyspnea. Maintained on mehtotrxate 5mg  weekly, folic acid and prednisone 5mg  daily. No evidence of infection. Last LFT was 3 months ago and normal. Hycodan helping cough and wants refill  NEw issue - her eye doc Dr Ellie Lunch called yesterday. PAtient has no complaints of vision but post capsulootmy few week ago and now  yesterday visual field deficit on Rt side is worse. Concern is ocular sarcoid is worse. They are referring her to [place]. PAtient does not want step up Rx of sarcoid till all this is sorted out  New issue - noticed some bruising in left forearm yesterday sponatenous x 2 spots. New. Asytmptomatcic and last 1 weeks some punctate red lesion in distal 1/3 of leg. Associatd with gardening but denies poison ivy. Unchanged since insidious onset. No associatd fever.    OV 04/09/2015  Chief Complaint  Patient presents with  . Follow-up    Pt denies cough, SOB, CP/tightness. Pt states her breathing is doing well. Pt states she had a recent rash on BIL lower extremities and saw derm and was given a topical ointment that helped. Per pt the derm thought the rash was not related to sarcoid.     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 Main symptom is severe chronic cough with high RSI score suggestive of irritable larynx syndrome that is associated as well   Cough /Saarcoid / IRritiable LArynx/ RML syundrome - cough is significantly improved. RSI cough score is only 14. She feels that the cough is at a stage where it is completely fine and she can handle things. She is maintained on methotrexate 5 mg weekly, folic acid and prednisone 5 mg daily. She did have some lowish white count after last visit and anemia but we held to the methotrexate and it seems to be stable. She also had a rash at the time of last visit she has seen dermatology and apparently the biopsies do not show this is due to sarcoid. I personally review this outside chart and confirmed that. Of note her last chest x-ray was November 2015 which showed continued right middle lobe collapse. The cough is controlled with the methotrexate and prednisone. She is no longer on ACE inhibitor even though our MAR reports that Also noted that she reports some mild weight gain with prednisone but she says she  will try dietary measures to shake this weight off. She does not want to cough to come and therefore she is content continue with the methotrexate and prednisone. In terms of her vision she is [facility] in October 2016.    OV 07/16/2015  Chief Complaint  Patient presents with  . Follow-up    Pt states her breathing is unchanged but her cough has worsened. Pt states her SOB is at her baseline. Pt states her cough is nonproductive and worse in the evening. Pt denies CP/tightness.     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 Main symptom is severe chronic cough with high RSI score suggestive of irritable larynx syndrome that is associated as well   Cough /Saarcoid / IRritiable LArynx/ RML syundrome -she now reports that her cough is worse for the last few months especially since last visit. RSI cough score is 18 and  subjectively she feels that cough is worse by 25%. Otherwise she is okay. Cough is associated with worsening postnasal drip. She is also asking for refills on a cough syrup methotrexate folic acid. She prefers to have 90 day refills. Despite cough worsened she is not too keen on gabapentin therapy and wants to try other simpler measures at this point in time. There is no fever or sputum production that is associated with the cough.  In terms of her ophthalmology issues she has been reassured apparently it is a left optic now that is problematic in this 50% gone. No active follow-up.   OV 10/18/2015   Chief Complaint  Patient presents with  . Follow-up    Pt states her cough has significantly improved. Pt denies SOB and CP/tightness. Pt states overall she feels she is doing well.     Follow-up chronic cough associated with right middle lobe syndrome secondary to sarcoidosis and irritable larynx syndrome  Last visit December 2016. At the time cough that deteriorated. The significant  amount of postnasal drip. We instituted nasal steroids and with this cough is significantly improved and back to baseline. RSI cough score is 10 which is around her baseline. She feels immensely better. She is in a good phase of her health. There are no new issues. She is pending $24 and over-the-counter nasal steroid and we discussed about doing a generic prescription which might be cheaper. She wants refill postdated on her cough syrup. She wants 90 day refills on her methotrexate. She is happy with having a next follow-up in 6 months.   OV 04/22/2016  Chief Complaint  Patient presents with  . Follow-up    Pt states her breathing is at baseline. Pt states she is doing well. Pt states has a rare cough, not needing cough syrup any longer. Pt denies CP/tightness and f/c/s.      follow-up chronic cough associated with right middle lobe syndrome secondary to sarcoidosis [also ocular sarcoid] and irritable larynx syndrome  Last visit was me was in March 2017. In the interim she has visited Designer, jewellery. She is on prednisone 5 mg per day and also methotrexate 5 mg once a week.Her cough is significantly improved. She had a chest x-ray May 2017 that I personally visualized shows significant improvement in her right middle lobe laps compared to 2011/2012. She's not on the needing cough syrup. She has right hip DJD now and apparently hip surgery might be contemplated. She is using nasal ster  oid. She does not do Bactrim prophylaxis. RSI cough score show significant improvement with score of 6 which is only mild cough.   OV 10/28/2016  Chief Complaint  Patient presents with  . Follow-up    sarcoidosis, wants to discuss hair loss    Fu Chronic cough due to right middle lobe syndrome due to sarcoidosis with associated with ocular sarcoidosis. On methotrexate 5 mg once weekly associated with prednisone low dose 4 mg/5 mg   She continues to do well. Cough is improved 1 RSI cough score 5 is  documented below. In January 2008 and she had replacement and she had a clear chest x-ray that I personally visualized. The cough is only mild. She has a new problem in the last 6 months of mild alopecia that is persistent and stable. Not associated with any pain or radiation or aggravating or relieving factors. It is associated with methotrexate intake. She wants to know about taking Biotene supplements that was advised by the hairdresser. Husband  is wondering if methotrexate is the etiology for this. Methotrexate can cause less than 10% alopecia not otherwise specified.   OV 01/13/2017  Chief Complaint  Patient presents with  . Follow-up    Pt states her breathing is baseline. Pt states she has weaned off the methotrexate. Pt states she has mild dry cough. Pt denies CP/tightness.     84 year old female follow-up sarcoidosis associated with severe chronic cough right middle lobe syndrome and associated ocular sarcoidosis. Previously on methotrexate but low-dose prednisone currently.  Last visit in March 2018 because of good control cough and new onset of alopecia has mild he stopped the methotrexate. After this she is here for follow-up. She says that her cough has not gotten worse in the absence of methotrexate. Of note methotrexate was the only agent to improve her cough a few years ago. Her alopecia is improving after stopping the methotrexate. She strongly believes the methotrexate as the cause of alopecia and is agreeable for methotrexate to be listed in her allergies. She is continuing 5 mg of redness on her day. She plans to continue this because of ocular sarcoidosis as well. Her weight is causing some easy bruising which she is accepting. Today RSI cough score is only 3 and shows good control of cough    OV 11/10/2017  Chief Complaint  Patient presents with  . Follow-up    Pt states she has been doing well since last visit and denies any complaints or concerns.     84 year old female  follow-up sarcoidosis associated with severe chronic cough right middle lobe syndrome and associated ocular sarcoidosis. Previously on methotrexate as of 2018 but low-dose prednisone currently through 2019. LAsT CXR Jan 2018 - clear   Follow-up chronic cough related to the above. She is now off methotrexate for over a year. Her alopecia has resolved. Her hair is back. She feels happy. She is on 5 mg prednisone a day. She feels well. Cough is minimal to nonexistent. She has ophthalmology appointment pending. I thought the current prednisone is because of her ocular sarcoid but she thinks it might be because of pulmonary and ocular sarcoid. She is willing to taper her prednisone down to 4 mg per day. Her husband is here with her as always. She is up-to-date with flu shot.She feels well without any respiratory symptoms of fatigue. No new medical problems or ER visits   OV 05/25/2018  Subjective:  Patient ID: [name], female , DOB: Apr 24, 1931 , age 27 y.o. , MRN: [id number] , ADDRESS: [id number] [place] [facility] [address]   05/25/2018 -   Chief Complaint  Patient presents with  . Follow-up    Doing well at this time.     HPI EMBER HENRIKSON 84 y.o. -     OV 05/25/2018  Chief Complaint  Patient presents with  . Follow-up    Doing well at this time.     84 year old female follow-up sarcoidosis (bx proven LUL dec 2014 at [facility]) associated with severe chronic cough right middle lobe syndrome and associated ocular sarcoidosis. Previously on methotrexate as of 2018 but low-dose prednisone currently through 2019. LAsT CXR Jan 2018 - clear   Last seen in April 2019.  At that time she was doing asymptomatic.  She currently is asymptomatic but this is a quicker than expected follow-up.  This is because she went to her primary care physician and she talked about her right middle lobe syndrome.  Therefore a chest x-ray was done and  this is not normal.  Therefore a CT was recommended.  She  then called my office.  We decided to get a CT chest and see her today.  She and her husband insist that she is still asymptomatic.  She is doing well on 4 mg prednisone.  She is exercising regularly.  No cough, no wheeze, no hemoptysis no sputum.  However CT scan of the chest October 2019 when compared to 2011 is significantly worse with worsening infiltrates and right middle lobes collapse and also beginning stages of left upper lobe collapse.  Most of her decline seem to happen in the early years of the diagnosis of sarcoidosis between 2011 and 2015.  In 2014 she did have a CT scan of the chest at [facility].  I do not have the images to compare but it appears that most of the report from 2014 and the current 2019 report might just be the same or perhaps the infiltrates are worse.  It is unclear.  The best comparison might be chest x-ray to chest x-ray because the most recent chest x-ray was 2018.  Also pulmonary function testing is been a while since we did one.   Dr Lorenza Cambridge Reflux Symptom Index (> 13-15 suggestive of LPR cough) Dec 2016 10/18/2015  04/22/2016  10/28/2016  01/13/2017   Hoarseness of problem with voice 2 3 3 1 1   Clearing  Of Throat 3 2 1 1  0  Excess throat mucus or feeling of post nasal drip 1 1 0 1 0  Difficulty swallowing food, liquid or tablets 2 1 0 1 0  Cough after eating or lying down 3 0 0 0 0  Breathing difficulties or choking episodes 1 0 0 0 2  Troublesome or annoying cough 3 1 0 0 0  Sensation of something sticking in throat or lump in throat 0 1 0 0 0  Heartburn, chest pain, indigestion, or stomach acid coming up 3 1 2 1 1   TOTAL 18 10 6 5 3       CT dec 2014 at [facility]  - unable to see image - local comparison 2012, 2014 CT chest 05/21/2018 at [facility] - comparison 2011  There is occlusion of the right middle lobe bronchus with atelectasis of the majority of the right middle lobe. There is also occlusion of the left upper lobe bronchus proximally with associated soft  tissue, increased from prior. There is diffuse mosaic attenuation which likely represents air trapping.  Impression:  1. New 5 mm left upper lobe pulmonary nodule. 6-12 month followup is recommended. Other pulmonary nodules are stable.  2. Persistent occlusion of the right middle lobe bronchus proximally with near complete right middle lobe atelectasis. There is occlusion of the left upper lobe bronchus which is slightly increased from the prior study with adjacent soft tissue. Attention on followup is recommended.  Electronically Signed JG:[id number] Milinda Pointer, MD Electronically Signed on:07/19/2013 1:29 PM Mediastinum/Nodes: No mediastinal lymphadenopathy. Small mediastinal lymph nodes have subtle mineralization evident. Irregular Soft tissue fullness in the hilar regions is similar to prior scan. The esophagus has normal imaging features. There is no axillary lymphadenopathy.  Lungs/Pleura: The central tracheobronchial airways are patent. 7 mm left upper lobe pulmonary nodule is stable in the interval. Interval development of numerous, right greater than left, irregular bilateral pulmonary nodules. There is diffuse Perilymphatic thickening and nodularity, right greater than left, progressive in the interval with nodular thickening of the right major fissure progressive since prior study. These changes are associated with  complete collapse of the right middle lobe. No pleural effusion  IMPRESSION: 1. Interval progression of bilateral perilymphatic thickening and nodularity, right greater than left. Imaging features are compatible with the patient's known history of sarcoidosis. 2. Interval development of complete collapse right middle lobe. Presumably also secondary to sarcoidosis and hilar involvement, but close follow-up recommended to exclude central obstructing lesion un related to sarcoidosis. 3.  Aortic Atherosclerois (ICD10-170.0)   Electronically Signed   By: Misty Stanley M.D.   On: 05/21/2018 09:54    ROS - per HPI     has a past medical history of Arthritis, Cervical spondylosis, Chest pain (07/28/2008), Collapsed lung ( 59/56), Complication of anesthesia, Dislocation closed, shoulder (7/14), DVT (deep venous thrombosis) ([place]) (2006), GERD (gastroesophageal reflux disease), Headache, High cholesterol, Hypertension, Hypothyroidism, Peripheral vascular disease ([place]) (2006), PONV (postoperative nausea and vomiting), Sarcoidosis, and Sarcoidosis of lung ([place]).   reports that she has never smoked. She has never used smokeless tobacco.  Past Surgical History:  Procedure Laterality Date  . EYE SURGERY    . FOOT SURGERY Left 06/2005   bunion hammer toe  . HEMORRHOID SURGERY  12/1972  . INTRAOCULAR LENS INSERTION     right eye 10 /16 and left 06/16/2005  . JOINT REPLACEMENT    . SKIN BIOPSY  01/2007   basal cell carcinoma removed from right lower leg   . TOTAL HIP ARTHROPLASTY  06/2007   left  . TOTAL HIP ARTHROPLASTY Right 08/26/2016   Procedure: TOTAL HIP ARTHROPLASTY ANTERIOR APPROACH;  Surgeon: Melrose Nakayama, MD;  Location: [facility];  Service: Orthopedics;  Laterality: Right;  . TOTAL KNEE ARTHROPLASTY Right 10/2007   right    Allergies  Allergen Reactions  . Methotrexate Derivatives     Mild alopecia     Immunization History  Administered Date(s) Administered  . DTaP 12/10/1995  . Hepatitis A 12/10/1999  . Influenza Split 04/11/2013, 04/11/2014  . Influenza Whole 04/15/2010  . Influenza, High Dose Seasonal PF 04/17/2018  . Influenza,inj,Quad PF,6+ Mos 05/07/2015, 04/11/2016  . Influenza-Unspecified 05/09/2017  . Pneumococcal Polysaccharide-23 05/13/2004  . Pneumococcal-Unspecified 08/11/2010, 03/11/2014  . Td 02/08/2002  . Tdap 08/11/2012  . Zoster 06/12/2007    Family History  Problem Relation Age of Onset  . Diabetes Mother   . Heart failure Mother   . Hypertension Father   . Diabetes Father   . Heart disease Father   .  Heart attack Father      Current Outpatient Medications:  .  amLODipine (NORVASC) 10 MG tablet, Take 10 mg by mouth daily., Disp: , Rfl:  .  azelastine (OPTIVAR) 0.05 % ophthalmic solution, Place 1 drop into both eyes 2 (two) times daily. , Disp: , Rfl:  .  fluticasone (FLONASE) 50 MCG/ACT nasal spray, Place 2 sprays into both nostrils daily., Disp: 48 g, Rfl: 5 .  folic acid (FOLVITE) 1 MG tablet, Take 1 tablet (1 mg total) by mouth daily., Disp: 90 tablet, Rfl: 1 .  gemfibrozil (LOPID) 600 MG tablet, Take 600 mg by mouth 2 (two) times daily before a meal., Disp: , Rfl:  .  levothyroxine (SYNTHROID, LEVOTHROID) 25 MCG tablet, Take 25 mcg by mouth daily. , Disp: , Rfl:  .  LORazepam (ATIVAN) 0.5 MG tablet, Take 0.5 mg by mouth every 8 (eight) hours as needed for anxiety or sleep. , Disp: , Rfl:  .  losartan (COZAAR) 50 MG tablet, Take 50 mg by mouth daily., Disp: , Rfl:  .  Multiple Vitamin (  MULTIVITAMIN) capsule, Take 1 capsule by mouth daily., Disp: , Rfl:  .  omeprazole (PRILOSEC) 20 MG capsule, Take 20 mg by mouth daily as needed (heartburn). , Disp: , Rfl: 7 .  Polyethylene Glycol 3350 (GLYCOLAX PO), Take by mouth., Disp: , Rfl:  .  UNABLE TO FIND, Med Name: CA 2- 600mg  qd, Disp: , Rfl:       Objective:   Vitals:   05/25/18 1457  BP: 122/70  Pulse: (!) 57  SpO2: 97%  Weight: 138 lb 12.8 oz (63 kg)  Height: 5\' 2"  (1.575 m)    Estimated body mass index is 25.39 kg/m as calculated from the following:   Height as of this encounter: 5\' 2"  (1.575 m).   Weight as of this encounter: 138 lb 12.8 oz (63 kg).  @WEIGHTCHANGE @  [employer]   05/25/18 1457  Weight: 138 lb 12.8 oz (63 kg)     Physical Exam  General Appearance:    Alert, cooperative, no distress, appears stated age - yes , Deconditioned looking - no , OBESE  - no, Sitting on Wheelchair -  no  Head:    Normocephalic, without obvious abnormality, atraumatic  Eyes:    PERRL, conjunctiva/corneas clear,  Ears:     Normal TM's and external ear canals, both ears  Nose:   Nares normal, septum midline, mucosa normal, no drainage    or sinus tenderness. OXYGEN ON  - no . Patient is @ ra   Throat:   Lips, mucosa, and tongue normal; teeth and gums normal. Cyanosis on lips - no  Neck:   Supple, symmetrical, trachea midline, no adenopathy;    thyroid:  no enlargement/tenderness/nodules; no carotid   bruit or JVD  Back:     Symmetric, no curvature, ROM normal, no CVA tenderness  Lungs:     Distress - no , Wheeze no, Barrell Chest - no, Purse lip breathing - no, Crackles - no   Chest Wall:    No tenderness or deformity.    Heart:    Regular rate and rhythm, S1 and S2 normal, no rub   or gallop, Murmur - no  Breast Exam:    NOT DONE  Abdomen:     Soft, non-tender, bowel sounds active all four quadrants,    no masses, no organomegaly. Visceral obesity - no  Genitalia:   NOT DONE  Rectal:   NOT DONE  Extremities:   Extremities - normal, Has Cane - no, Clubbing - no, Edema - no  Pulses:   2+ and symmetric all extremities  Skin:   Stigmata of Connective Tissue Disease - no  Lymph nodes:   Cervical, supraclavicular, and axillary nodes normal  Psychiatric:  Neurologic:   Pleasant - yes, Anxious - no, Flat affect - no  CAm-ICU - neg, Alert and Oriented x 3 - yes, Moves all 4s - yes, Speech - normal, Cognition - intact           Assessment:       ICD-10-CM   1. Sarcoidosis D86.9   2. Right middle lobe syndrome J98.19        Plan:     Patient Instructions     ICD-10-CM   1. Sarcoidosis D86.9   2. Right middle lobe syndrome J98.19    Glad you are feeling well and asymptomatic and doing well on 4 mg prednisone and exercising  The CT scan of the chest is definitely worse since 2011 through 2019  However, for more accurate  comparison will need to compare the chest x-ray from January 2018 in our system with a chest x-ray just had in the fall 2019 with the primary care physician  Another comparison  would be to compare the [facility] CT chest from 2014 with October 2019 CT chest  Plan -Please sign release and get a CD-ROM of your recent chest x-ray from your primary care physician.  You can drop this off sometime in the next few weeks -Depending on these results we might try to get the CD-ROM from [facility] of the CT chest in 2014 -In 3 months do spirometry and DLCO  Follow-up -ILD clinic in 3 months   > 50% of this > 25 min visit spent in face to face counseling or coordination of care - by this undersigned MD - Dr Brand Males. This includes one or more of the following documented above: discussion of test results, diagnostic or treatment recommendations, prognosis, risks and benefits of management options, instructions, education, compliance or risk-factor reduction   SIGNATURE    Dr. Brand Males, M.D., F.C.C.P,  Pulmonary and Critical Care Medicine Staff Physician, [place] Director - Interstitial Lung Disease  Program  P[facility] [place], [place], [address]  Pager: [phone], If no answer or between  15:00h - 7:00h: call [phone] Telephone: [phone]  3:40 PM 05/25/2018

## 2018-06-25 ENCOUNTER — Telehealth: Payer: Self-pay | Admitting: Internal Medicine

## 2018-06-25 NOTE — Telephone Encounter (Signed)
Received a refill request from pt's pharmacy for prednisone 1mg  tablet #120 with 2 refills. It looks like this was originally written 03/17/18 with the 2 refills but as of today, 06/25/18, pt has no remaining refills of the prednisone.  Pt had prednisone filled 03/18/18, 04/20/18, and again on 05/17/18.  Looking at pt's current med list, we do not have prednisone listed on here for pt, but looking at pt's last OV with MR 05/25/18, MR stated in there: Glad you are feeling well and asymptomatic and doing well on 4 mg prednisone and exercising.  MR, please advise if pt is supposed to still be on prednisone as looking at pt's history for the prednisone Rx, there was a 1mg  prednisone Rx with an end date of 05/25/18 and nothing else after that.  If pt is still supposed to be on the prednisone, please advise if we can send in a new Rx for pt. Thanks!

## 2018-06-29 NOTE — Telephone Encounter (Signed)
Called patient unable to reach left message to give us a call back.

## 2018-06-29 NOTE — Telephone Encounter (Signed)
Please call [name] Page an dconfirm that she indeed is still taking 4mg  prednisone per day. And if so refill to the extent she wants

## 2018-06-30 NOTE — Telephone Encounter (Signed)
Called and spoke with pt to see if she is still taking prednisone and per pt, she is still taking 4mg  of prednisone. Pt states she is wanting to know if she can go back to 5mg  as she feels like 5mg  works better for her.  MR, please advise if you are okay for [place] to send a Rx of pred 5mg  for pt instead of pred 4mg ? Thanks!

## 2018-06-30 NOTE — Telephone Encounter (Signed)
5mg  per day prednisone is fine

## 2018-07-01 MED ORDER — PREDNISONE 5 MG PO TABS: 5.0000 mg | ORAL_TABLET | Freq: Every day | ORAL | 1 refills | Status: DC

## 2018-07-01 NOTE — Telephone Encounter (Signed)
Spoke with pt. She is aware of MR's response. Rx has been sent in. Nothing further was needed. 

## 2018-07-10 ENCOUNTER — Other Ambulatory Visit: Payer: Self-pay

## 2018-07-10 ENCOUNTER — Encounter (HOSPITAL_BASED_OUTPATIENT_CLINIC_OR_DEPARTMENT_OTHER): Payer: Self-pay | Admitting: Emergency Medicine

## 2018-07-10 ENCOUNTER — Emergency Department (HOSPITAL_BASED_OUTPATIENT_CLINIC_OR_DEPARTMENT_OTHER)
Admission: EM | Admit: 2018-07-10 | Discharge: 2018-07-10 | Disposition: A | Discharge status: Home / Self Care | Payer: Medicare Other | Attending: Emergency Medicine | Admitting: Emergency Medicine

## 2018-07-10 ENCOUNTER — Emergency Department (HOSPITAL_BASED_OUTPATIENT_CLINIC_OR_DEPARTMENT_OTHER): Payer: Medicare Other

## 2018-07-10 DIAGNOSIS — S8392XA Sprain of unspecified site of left knee, initial encounter: Secondary | ICD-10-CM | POA: Insufficient documentation

## 2018-07-10 DIAGNOSIS — S8992XA Unspecified injury of left lower leg, initial encounter: Secondary | ICD-10-CM | POA: Diagnosis present

## 2018-07-10 DIAGNOSIS — Y929 Unspecified place or not applicable: Secondary | ICD-10-CM | POA: Insufficient documentation

## 2018-07-10 DIAGNOSIS — E039 Hypothyroidism, unspecified: Secondary | ICD-10-CM | POA: Insufficient documentation

## 2018-07-10 DIAGNOSIS — Z79899 Other long term (current) drug therapy: Secondary | ICD-10-CM | POA: Diagnosis not present

## 2018-07-10 DIAGNOSIS — Y9301 Activity, walking, marching and hiking: Secondary | ICD-10-CM | POA: Diagnosis not present

## 2018-07-10 DIAGNOSIS — Z96643 Presence of artificial hip joint, bilateral: Secondary | ICD-10-CM | POA: Diagnosis not present

## 2018-07-10 DIAGNOSIS — Z96651 Presence of right artificial knee joint: Secondary | ICD-10-CM | POA: Insufficient documentation

## 2018-07-10 DIAGNOSIS — I10 Essential (primary) hypertension: Secondary | ICD-10-CM | POA: Diagnosis not present

## 2018-07-10 DIAGNOSIS — S8392XS Sprain of unspecified site of left knee, sequela: Secondary | ICD-10-CM

## 2018-07-10 DIAGNOSIS — R52 Pain, unspecified: Secondary | ICD-10-CM | POA: Diagnosis not present

## 2018-07-10 DIAGNOSIS — Y999 Unspecified external cause status: Secondary | ICD-10-CM | POA: Diagnosis not present

## 2018-07-10 DIAGNOSIS — X58XXXA Exposure to other specified factors, initial encounter: Secondary | ICD-10-CM | POA: Insufficient documentation

## 2018-07-10 MED ORDER — HYDROCODONE-ACETAMINOPHEN 5-325 MG PO TABS
1.0000 | ORAL_TABLET | Freq: Once | ORAL | Status: AC
  Administered 2018-07-10: 1 via ORAL
  Filled 2018-07-10: qty 1

## 2018-07-10 MED ORDER — HYDROCODONE-ACETAMINOPHEN 5-325 MG PO TABS: 1.0000 | ORAL_TABLET | Freq: Four times a day (QID) | ORAL | 0 refills | Status: DC | PRN

## 2018-07-10 NOTE — ED Triage Notes (Signed)
Pt c/o LT knee pain s/p buckling while walking today

## 2018-07-10 NOTE — ED Provider Notes (Signed)
[facility] EMERGENCY DEPARTMENT Provider Note   CSN: [id number] Arrival date & time: 07/10/18  1158     History   Chief Complaint Chief Complaint  Patient presents with  . Knee Pain    HPI [name] Page is a 84 y.o. female.  Patient reports that she was walking today and her left knee "gave out on me" resulting in immediate pain to her left knee, posterior aspect.  Pain is worse with weightbearing and improved with nonweightbearing.  She had a similar episode 5 days ago.  She did not fall to the ground.  No other injury.  No treatment prior to coming here.  Brought by EMS  HPI  Past Medical History:  Diagnosis Date  . Arthritis   . Cervical spondylosis    C3-4 AND C4-5  . Chest pain 07/28/2008   H/O, normal stress nuclear EF 78%  . Collapsed lung  12/11   being treated at [facility] - in left lung  . Complication of anesthesia   . Dislocation closed, shoulder 7/14  . DVT (deep venous thrombosis) ([place]) 2006  . GERD (gastroesophageal reflux disease)    occ  . Headache    hx  . High cholesterol   . Hypertension   . Hypothyroidism   . Peripheral vascular disease ([place]) 2006   dvt's and 50 yrs ago  . PONV (postoperative nausea and vomiting)   . Sarcoidosis   . Sarcoidosis of lung [facility])     Patient Active Problem List   Diagnosis Date Noted  . Alopecia 10/28/2016  . Primary localized osteoarthritis of right hip 08/26/2016  . Primary osteoarthritis of right hip 08/26/2016  . Pleuritic pain 12/17/2015  . Osteopenia 05/23/2015  . Cystocele 05/23/2015  . Rash and nonspecific skin eruption 01/02/2015  . Visual field defect nasal step 01/02/2015  . Encounter for therapeutic drug monitoring 10/03/2014  . Chronic cough 07/12/2014  . Nasal itching 01/30/2014  . 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 Sarcoidosis 01/30/2014  . Cystocele, midline 05/02/2013  . Chest pain   . History of recurrent UTIs 09/24/2011  . Vaginal atrophy 09/24/2011  . HYPERLIPIDEMIA  07/30/2010  . HYPERTENSION 07/30/2010  . DEEP VEIN THROMBOSIS/PHLEBITIS 07/30/2010  . PULMONARY COLLAPSE 07/30/2010  . PULMONARY NODULE 07/30/2010  . COUGH 07/30/2010    Past Surgical History:  Procedure Laterality Date  . EYE SURGERY    . FOOT SURGERY Left 06/2005   bunion hammer toe  . HEMORRHOID SURGERY  12/1972  . INTRAOCULAR LENS INSERTION     right eye 10 /16 and left 06/16/2005  . JOINT REPLACEMENT    . SKIN BIOPSY  01/2007   basal cell carcinoma removed from right lower leg   . TOTAL HIP ARTHROPLASTY  06/2007   left  . TOTAL HIP ARTHROPLASTY Right 08/26/2016   Procedure: TOTAL HIP ARTHROPLASTY ANTERIOR APPROACH;  Surgeon: Melrose Nakayama, MD;  Location: [facility];  Service: Orthopedics;  Laterality: Right;  . TOTAL KNEE ARTHROPLASTY Right 10/2007   right     OB History    Gravida  2   Para  2   Term  2   Preterm      AB      Living  2     SAB      TAB      Ectopic      Multiple      Live Births  2            Home Medications  Prior to Admission medications   Medication Sig Start Date End Date Taking? Authorizing Provider  amLODipine (NORVASC) 10 MG tablet Take 10 mg by mouth daily.    [provider]  azelastine (OPTIVAR) 0.05 % ophthalmic solution Place 1 drop into both eyes 2 (two) times daily.     [provider]  fluticasone (FLONASE) 50 MCG/ACT nasal spray Place 2 sprays into both nostrils daily. 02/23/18   Brand Males, MD  folic acid (FOLVITE) 1 MG tablet Take 1 tablet (1 mg total) by mouth daily. 01/01/18   Brand Males, MD  gemfibrozil (LOPID) 600 MG tablet Take 600 mg by mouth 2 (two) times daily before a meal.    [provider]  levothyroxine (SYNTHROID, LEVOTHROID) 25 MCG tablet Take 25 mcg by mouth daily.     [provider]  LORazepam (ATIVAN) 0.5 MG tablet Take 0.5 mg by mouth every 8 (eight) hours as needed for anxiety or sleep.  03/26/15   [provider]  losartan (COZAAR) 50  MG tablet Take 50 mg by mouth daily.    [provider]  Multiple Vitamin (MULTIVITAMIN) capsule Take 1 capsule by mouth daily.    [provider]  omeprazole (PRILOSEC) 20 MG capsule Take 20 mg by mouth daily as needed (heartburn).  05/02/15   [provider]  Polyethylene Glycol 3350 (GLYCOLAX PO) Take by mouth.    [provider]  predniSONE (DELTASONE) 5 MG tablet Take 1 tablet (5 mg total) by mouth daily with breakfast. 07/01/18   Brand Males, MD  UNABLE TO FIND Med Name: CA 2- 600mg  qd    [provider]    Family History Family History  Problem Relation Age of Onset  . Diabetes Mother   . Heart failure Mother   . Hypertension Father   . Diabetes Father   . Heart disease Father   . Heart attack Father     Social History Social History   Tobacco Use  . Smoking status: Never Smoker  . Smokeless tobacco: Never Used  Substance Use Topics  . Alcohol use: Yes    Comment: BEER/glass of wine A DAY  . Drug use: No     Allergies   Methotrexate derivatives   Review of Systems Review of Systems  Constitutional: Negative.   Musculoskeletal: Positive for arthralgias and gait problem.       Left knee pain.  Trouble walking since today.  Neurological: Negative for weakness and numbness.     Physical Exam Updated Vital Signs BP (!) 163/70   Pulse 60   Temp 98.2 F (36.8 C) (Oral)   Resp 16   Ht 5\' 2"  (1.575 m)   Wt 61.2 kg   SpO2 99%   BMI 24.69 kg/m   Physical Exam  Constitutional: She appears well-developed and well-nourished. No distress.  HENT:  Head: Normocephalic and atraumatic.  Eyes: EOM are normal.  Neck: Neck supple.  Cardiovascular: Normal rate.  Pulmonary/Chest: Effort normal.  Abdominal: She exhibits no distension.  Musculoskeletal: Normal range of motion.  Right lower extremity with surgical scar over knee.  Nontender, neurovascular intact.  Left lower extremity no swelling no deformity.  She has  pain on performing Lachman's test however Lachman's test is negative..  No collateral laxity or pain on varus or valgus moves of knee.  Negative posterior drawer test.  DP pulse 2+.  Good capillary refill.  All other extremities without redness swelling or tenderness neurovascular intact  Skin: Capillary refill takes  less than 2 seconds.     ED Treatments / Results  Labs (all labs ordered are listed, but only abnormal results are displayed) Labs Reviewed - No data to display  EKG None  Radiology No results found.  Procedures Procedures (including critical care time) Results for orders placed or performed during the hospital encounter of 01/00/71  Basic metabolic panel  Result Value Ref Range   Sodium 135 135 - 145 mmol/L   Potassium 4.4 3.5 - 5.1 mmol/L   Chloride 103 101 - 111 mmol/L   CO2 27 22 - 32 mmol/L   Glucose, Bld 117 (H) 65 - 99 mg/dL   BUN 6 6 - 20 mg/dL   Creatinine, Ser 0.53 0.44 - 1.00 mg/dL   Calcium 8.6 (L) 8.9 - 10.3 mg/dL   GFR calc non Af Amer >60 >60 mL/min   GFR calc Af Amer >60 >60 mL/min   Anion gap 5 5 - 15  CBC  Result Value Ref Range   WBC 6.4 4.0 - 10.5 K/uL   RBC 2.72 (L) 3.87 - 5.11 MIL/uL   Hemoglobin 9.5 (L) 12.0 - 15.0 g/dL   HCT 27.4 (L) 36.0 - 46.0 %   MCV 100.7 (H) 78.0 - 100.0 fL   MCH 34.9 (H) 26.0 - 34.0 pg   MCHC 34.7 30.0 - 36.0 g/dL   RDW 14.5 11.5 - 15.5 %   Platelets 188 150 - 400 K/uL   Dg Knee Complete 4 Views Left  Result Date: 07/10/2018 CLINICAL DATA:  Left knee pain after walking EXAM: LEFT KNEE - COMPLETE 4+ VIEW COMPARISON:  None. FINDINGS: No fracture, significant joint effusion or dislocation. Mild meniscal chondrocalcinosis. Clustered tiny 2-3 mm round calcifications in the anterior left knee joint adjacent to the tibial plateau. No suspicious focal osseous lesions. Mild patellofemoral compartment osteoarthritis. IMPRESSION: 1. No left knee fracture, joint effusion or dislocation. 2. Mild meniscal chondrocalcinosis,  suggesting CPPD arthropathy. Mild patellofemoral compartment osteoarthritis. 3. Clustered tiny round calcifications in the anterior left knee joint adjacent to the tibial plateau, which may represent loose intra-articular osseous bodies. Electronically Signed   By: Ilona Sorrel M.D.   On: 07/10/2018 13:08   Medications Ordered in ED Medications - No data to display X-ray viewed by me  Immobilizer placed on patient by ED technician she is able to ambulate with knee immobilizer. She initially declined pain medicine but now is requesting pain medicine after ambulating with knee immobilizer. Plan prescription [facility] Controlled Substance reporting System queried.  Is requesting Dr.Daldorf for follow-up Initial Impression / Assessment and Plan / ED Course  I have reviewed the triage vital signs and the nursing notes.  Pertinent labs & imaging results that were available during my care of the patient were reviewed by me and considered in my medical decision making (see chart for details).       Final Clinical Impressions(s) / ED Diagnoses  Diagnosis left knee sprain Final diagnoses:  None    ED Discharge Orders    None       Orlie Dakin, MD 07/10/18 1435

## 2018-07-10 NOTE — ED Notes (Signed)
Patient transported to X-ray 

## 2018-07-10 NOTE — Discharge Instructions (Addendum)
Wear the knee immobilizer as needed for comfort and stability while walking.  Take the medication prescribed for bad pain or Tylenol for mild pain.  Do not take the medication prescribed together with Tylenol as the combination can be dangerous to your liver.  Call Dr. Mariann Laster office in 2 days to schedule the next available appointment.  Tell office staff that you were seen here and had x-rays performed here

## 2018-07-10 NOTE — ED Notes (Signed)
Assisted pt with bedpan; voided w/o difficulty

## 2018-08-20 ENCOUNTER — Other Ambulatory Visit: Payer: Self-pay | Admitting: Internal Medicine

## 2018-08-20 DIAGNOSIS — D869 Sarcoidosis, unspecified: Secondary | ICD-10-CM

## 2018-08-23 ENCOUNTER — Ambulatory Visit (INDEPENDENT_AMBULATORY_CARE_PROVIDER_SITE_OTHER): Payer: Medicare Other | Admitting: Internal Medicine

## 2018-08-23 DIAGNOSIS — D869 Sarcoidosis, unspecified: Secondary | ICD-10-CM | POA: Diagnosis not present

## 2018-08-23 LAB — PULMONARY FUNCTION TEST
DL/VA % pred: 88 %
DL/VA: 4.03 ml/min/mmHg/L
DLCO UNC: 16.59 ml/min/mmHg
DLCO unc % pred: 76 %
FEF 25-75 PRE: 1.39 L/s
FEF2575-%Pred-Pre: 133 %
FEV1-%Pred-Pre: 127 %
FEV1-Pre: 2 L
FEV1FVC-%Pred-Pre: 98 %
FEV6-%Pred-Pre: 137 %
FEV6-Pre: 2.77 L
FEV6FVC-%Pred-Pre: 105 %
FVC-%Pred-Pre: 130 %
FVC-PRE: 2.8 L
PRE FEV1/FVC RATIO: 72 %
PRE FEV6/FVC RATIO: 99 %

## 2018-08-23 NOTE — Progress Notes (Signed)
Spirometry and Dlco done today. 

## 2018-08-24 ENCOUNTER — Ambulatory Visit: Payer: Medicare Other | Admitting: Internal Medicine

## 2018-08-24 ENCOUNTER — Encounter: Payer: Self-pay | Admitting: Internal Medicine

## 2018-08-24 ENCOUNTER — Telehealth: Payer: Self-pay | Admitting: Internal Medicine

## 2018-08-24 VITALS — BP 126/62 | HR 60 | Ht 62.0 in | Wt 138.0 lb

## 2018-08-24 DIAGNOSIS — J9819 Other pulmonary collapse: Secondary | ICD-10-CM

## 2018-08-24 DIAGNOSIS — D869 Sarcoidosis, unspecified: Secondary | ICD-10-CM | POA: Diagnosis not present

## 2018-08-24 DIAGNOSIS — R053 Chronic cough: Secondary | ICD-10-CM

## 2018-08-24 DIAGNOSIS — R05 Cough: Secondary | ICD-10-CM

## 2018-08-24 NOTE — Telephone Encounter (Signed)
Patient returned call, CB is [phone].  She states she does not have the CD's and that MR is going to get these from [facility].

## 2018-08-24 NOTE — Patient Instructions (Addendum)
ICD-10-CM   1. Sarcoidosis D86.9   2. Right middle lobe syndrome J98.19     Clinically well on low dose steroid but we do need to know more  about progression on CT 2019 - if true compared to recent times or not  Currently breathiing test normal  Plan - continue prednisone 5mg  per day  - sign release to get CD ROM of 2012 and 2014 CT chest from [facility]  - also see if you hve this at home  Followup 6 months or sooner in regular clinic

## 2018-08-24 NOTE — Telephone Encounter (Signed)
Called and spoke with patients husband, he stated patient was not home but he would have her call us back. Will wait phone call back.

## 2018-08-24 NOTE — Progress Notes (Signed)
OV 08/24/2014  Chief Complaint  Patient presents with  . Follow-up    Pt stated the gabapentin has not changed her breathing or cough. Pt c/o prod cough with white mucus. Pt denies SOB and CP/tightness.    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 Main symptom is severe chronic cough with high RSI score suggestive of irritable larynx syndrome that is associated as well  She always finds prednisone to help the cough but due to age and side effects despite good functional status she opts to take a low-dose of prednisone. Currently she is on 5 mg prednisone. In November 2015 due to RSI score of 27 for cough but decided to trial gabapentin to tackle the neurogenic component of cough. She reports now that the gabapentin has not helped her at all although objectively the RSI cough score has drop to 20. She is very frustrated. She and her husband are reluctant to have right middle lobectomy given her advanced age despite good functional status. There are no open to trying methotrexate to tackle the inflammation of sarcoidosis and as a steroid sparing agent. She also wants to come off gabapentin   OV 10/02/2014  Chief Complaint  Patient presents with  . Follow-up    Pt states her breathing is doing well. Pt states her SOB has improved since last OV. Pt c/o mild cough. Pt denies CP/tightness. Pt stated being on methotrexate has significantly helped.     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 Main symptom is severe chronic cough with high RSI score suggestive of irritable larynx syndrome that is associated as well    Last visit in January 2016 she told me that gabapentin did not help her cough. So we stopped this. We started methotrexate for inflammation in her airway due to  sarcoid causing right middle lobe collapse. We left her on prednisone 5 mg per day and started her on  methotrexate 5 mg once a week. Also gave Hycodan cough syrup. She says that the Hycodan cough syrup did not help However, 2 weeks after starting her methotrexate she started noticing dramatic improvement in her cough. She hardly coughs now. She has significant improvement in her quality of life and she is extremely happy.  RSI cough score had dropped from 25 to 20 with gabapentin has now dropped to 5.  She is asking questions about how long to take methotrexate. And also review of the side effect profile. Of note I did notice that she is on ACE inhibitor that can potentially provoke all make her cough worse. She is not taking gabapentin anymore. There is no fever or chills. At this point in time she's not interested in Bactrim PCP prophylaxis which should be okay given low-dose of methotrexate and prednisone  Past, Family, Social reviewed: no change since last visit    OV 01/02/2015  Chief Complaint  Patient presents with  . Follow-up    Pt recently went to opthamologist and pts vision has decreased since last OV. Pt stated her breathing is unchanged since last OV. Pt c/o mild dry cough. Pt denies CP/tightness.     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 Main symptom is severe chronic cough with high RSI score suggestive of irritable larynx syndrome that is associated as well  Cough /Saarcoid / IRritiable LArynx/ RML syundrome - cough persists but is only mild and baseline.  No problems. Does not feel it impacts qualkity of life. She is off ace inhibitor now  ; forgot to mention this during med reconciliation. No asociated wheeze or dyspnea. Maintained on mehtotrxate 5mg  weekly, folic acid and prednisone 5mg  daily. No evidence of infection. Last LFT was 3 months ago and normal. Hycodan helping cough and wants refill  NEw issue - her eye doc Dr Ellie Lunch called yesterday. PAtient has no complaints of vision but post capsulootmy few week ago and now  yesterday visual field deficit on Rt side is worse. Concern is ocular sarcoid is worse. They are referring her to [facility]. PAtient does not want step up Rx of sarcoid till all this is sorted out  New issue - noticed some bruising in left forearm yesterday sponatenous x 2 spots. New. Asytmptomatcic and last 1 weeks some punctate red lesion in distal 1/3 of leg. Associatd with gardening but denies poison ivy. Unchanged since insidious onset. No associatd fever.    OV 04/09/2015  Chief Complaint  Patient presents with  . Follow-up    Pt denies cough, SOB, CP/tightness. Pt states her breathing is doing well. Pt states she had a recent rash on BIL lower extremities and saw derm and was given a topical ointment that helped. Per pt the derm thought the rash was not related to sarcoid.     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 Main symptom is severe chronic cough with high RSI score suggestive of irritable larynx syndrome that is associated as well   Cough /Saarcoid / IRritiable LArynx/ RML syundrome - cough is significantly improved. RSI cough score is only 14. She feels that the cough is at a stage where it is completely fine and she can handle things. She is maintained on methotrexate 5 mg weekly, folic acid and prednisone 5 mg daily. She did have some lowish white count after last visit and anemia but we held to the methotrexate and it seems to be stable. She also had a rash at the time of last visit she has seen dermatology and apparently the biopsies do not show this is due to sarcoid. I personally review this outside chart and confirmed that. Of note her last chest x-ray was November 2015 which showed continued right middle lobe collapse. The cough is controlled with the methotrexate and prednisone. She is no longer on ACE inhibitor even though our MAR reports that Also noted that she reports some mild weight gain with prednisone but she says she  will try dietary measures to shake this weight off. She does not want to cough to come and therefore she is content continue with the methotrexate and prednisone. In terms of her vision she is waiting for [facility] referral to [facility] ophthalmology in October 2016.    OV 07/16/2015  Chief Complaint  Patient presents with  . Follow-up    Pt states her breathing is unchanged but her cough has worsened. Pt states her SOB is at her baseline. Pt states her cough is nonproductive and worse in the evening. Pt denies CP/tightness.     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 Main symptom is severe chronic cough with high RSI score suggestive of irritable larynx syndrome that is associated as well   Cough /Saarcoid / IRritiable LArynx/ RML syundrome -she now reports that her cough is worse for the last few months especially since last visit. RSI cough score is 18 and subjectively  she feels that cough is worse by 25%. Otherwise she is okay. Cough is associated with worsening postnasal drip. She is also asking for refills on a cough syrup methotrexate folic acid. She prefers to have 90 day refills. Despite cough worsened she is not too keen on gabapentin therapy and wants to try other simpler measures at this point in time. There is no fever or sputum production that is associated with the cough.  In terms of her ophthalmology issues she has been reassured apparently it is a left optic now that is problematic in this 50% gone. No active follow-up.   OV 10/18/2015   Chief Complaint  Patient presents with  . Follow-up    Pt states her cough has significantly improved. Pt denies SOB and CP/tightness. Pt states overall she feels she is doing well.     Follow-up chronic cough associated with right middle lobe syndrome secondary to sarcoidosis and irritable larynx syndrome  Last visit December 2016. At the time cough that deteriorated. The significant  amount of postnasal drip. We instituted nasal steroids and with this cough is significantly improved and back to baseline. RSI cough score is 10 which is around her baseline. She feels immensely better. She is in a good phase of her health. There are no new issues. She is pending $24 and over-the-counter nasal steroid and we discussed about doing a generic prescription which might be cheaper. She wants refill postdated on her cough syrup. She wants 90 day refills on her methotrexate. She is happy with having a next follow-up in 6 months.   OV 04/22/2016  Chief Complaint  Patient presents with  . Follow-up    Pt states her breathing is at baseline. Pt states she is doing well. Pt states has a rare cough, not needing cough syrup any longer. Pt denies CP/tightness and f/c/s.      follow-up chronic cough associated with right middle lobe syndrome secondary to sarcoidosis [also ocular sarcoid] and irritable larynx syndrome  Last visit was me was in March 2017. In the interim she has visited Designer, jewellery. She is on prednisone 5 mg per day and also methotrexate 5 mg once a week.Her cough is significantly improved. She had a chest x-ray May 2017 that I personally visualized shows significant improvement in her right middle lobe laps compared to 2011/2012. She's not on the needing cough syrup. She has right hip DJD now and apparently hip surgery might be contemplated. She is using nasal ster  oid. She does not do Bactrim prophylaxis. RSI cough score show significant improvement with score of 6 which is only mild cough.   OV 10/28/2016  Chief Complaint  Patient presents with  . Follow-up    sarcoidosis, wants to discuss hair loss    Fu Chronic cough due to right middle lobe syndrome due to sarcoidosis with associated with ocular sarcoidosis. On methotrexate 5 mg once weekly associated with prednisone low dose 4 mg/5 mg   She continues to do well. Cough is improved 1 RSI cough score 5 is  documented below. In January 2008 and she had replacement and she had a clear chest x-ray that I personally visualized. The cough is only mild. She has a new problem in the last 6 months of mild alopecia that is persistent and stable. Not associated with any pain or radiation or aggravating or relieving factors. It is associated with methotrexate intake. She wants to know about taking Biotene supplements that was advised by the hairdresser. Husband is  wondering if methotrexate is the etiology for this. Methotrexate can cause less than 10% alopecia not otherwise specified.   OV 01/13/2017  Chief Complaint  Patient presents with  . Follow-up    Pt states her breathing is baseline. Pt states she has weaned off the methotrexate. Pt states she has mild dry cough. Pt denies CP/tightness.     85 year old female follow-up sarcoidosis associated with severe chronic cough right middle lobe syndrome and associated ocular sarcoidosis. Previously on methotrexate but low-dose prednisone currently.  Last visit in March 2018 because of good control cough and new onset of alopecia has mild he stopped the methotrexate. After this she is here for follow-up. She says that her cough has not gotten worse in the absence of methotrexate. Of note methotrexate was the only agent to improve her cough a few years ago. Her alopecia is improving after stopping the methotrexate. She strongly believes the methotrexate as the cause of alopecia and is agreeable for methotrexate to be listed in her allergies. She is continuing 5 mg of redness on her day. She plans to continue this because of ocular sarcoidosis as well. Her weight is causing some easy bruising which she is accepting. Today RSI cough score is only 3 and shows good control of cough    OV 11/10/2017  Chief Complaint  Patient presents with  . Follow-up    Pt states she has been doing well since last visit and denies any complaints or concerns.     85 year old female  follow-up sarcoidosis associated with severe chronic cough right middle lobe syndrome and associated ocular sarcoidosis. Previously on methotrexate as of 2018 but low-dose prednisone currently through 2019. LAsT CXR Jan 2018 - clear   Follow-up chronic cough related to the above. She is now off methotrexate for over a year. Her alopecia has resolved. Her hair is back. She feels happy. She is on 5 mg prednisone a day. She feels well. Cough is minimal to nonexistent. She has ophthalmology appointment pending. I thought the current prednisone is because of her ocular sarcoid but she thinks it might be because of pulmonary and ocular sarcoid. She is willing to taper her prednisone down to 4 mg per day. Her husband is here with her as always. She is up-to-date with flu shot.She feels well without any respiratory symptoms of fatigue. No new medical problems or ER visits   OV 05/25/2018  Subjective:  Patient ID: [name], female , DOB: Sep 03, 1930 , age 85 y.o. , MRN: [id number] , ADDRESS: [address] [place] [facility] [address]   05/25/2018 -   Chief Complaint  Patient presents with  . Follow-up    Doing well at this time.     HPI KHALEYA AWADALLA 85 y.o. -     OV 05/25/2018  Chief Complaint  Patient presents with  . Follow-up    Doing well at this time.     85 year old female follow-up sarcoidosis (bx proven LUL dec 2014 at [facility]) associated with severe chronic cough right middle lobe syndrome and associated ocular sarcoidosis. Previously on methotrexate as of 2018 but low-dose prednisone currently through 2019. LAsT CXR Jan 2018 - clear   Last seen in April 2019.  At that time she was doing asymptomatic.  She currently is asymptomatic but this is a quicker than expected follow-up.  This is because she went to her primary care physician and she talked about her right middle lobe syndrome.  Therefore a chest x-ray was done and this  is not normal.  Therefore a CT was recommended.  She  then called my office.  We decided to get a CT chest and see her today.  She and her husband insist that she is still asymptomatic.  She is doing well on 4 mg prednisone.  She is exercising regularly.  No cough, no wheeze, no hemoptysis no sputum.  However CT scan of the chest October 2019 when compared to 2011 is significantly worse with worsening infiltrates and right middle lobes collapse and also beginning stages of left upper lobe collapse.  Most of her decline seem to happen in the early years of the diagnosis of sarcoidosis between 2011 and 2015.  In 2014 she did have a CT scan of the chest at [facility].  I do not have the images to compare but it appears that most of the report from 2014 and the current 2019 report might just be the same or perhaps the infiltrates are worse.  It is unclear.  The best comparison might be chest x-ray to chest x-ray because the most recent chest x-ray was 2018.  Also pulmonary function testing is been a while since we did one.   Dr Lorenza [place] Reflux Symptom Index (> 13-15 suggestive of LPR cough) Dec 2016 10/18/2015  04/22/2016  10/28/2016  01/13/2017   Hoarseness of problem with voice 2 3 3 1 1   Clearing  Of Throat 3 2 1 1  0  Excess throat mucus or feeling of post nasal drip 1 1 0 1 0  Difficulty swallowing food, liquid or tablets 2 1 0 1 0  Cough after eating or lying down 3 0 0 0 0  Breathing difficulties or choking episodes 1 0 0 0 2  Troublesome or annoying cough 3 1 0 0 0  Sensation of something sticking in throat or lump in throat 0 1 0 0 0  Heartburn, chest pain, indigestion, or stomach acid coming up 3 1 2 1 1   TOTAL 18 10 6 5 3       CT dec 2014 at [place]  - unable to see image - local comparison [facility] 2012, 2014 CT chest 05/21/2018 at [facility] - [facility] 2011[facility]  There is occlusion of the right middle lobe bronchus with atelectasis of the majority of the right middle lobe. There is also occlusion of the left upper lobe bronchus proximally with  associated soft tissue, increased from prior. There is diffuse mosaic attenuation which likely represents air trapping.  Impression:  1. New 5 mm left upper lobe pulmonary nodule. 6-12 month followup is recommended. Other pulmonary nodules are stable.  2. Persistent occlusion of the right middle lobe bronchus proximally with near complete right middle lobe atelectasis. There is occlusion of the left upper lobe bronchus which is slightly increased from the prior study with adjacent soft tissue. Attention on followup is recommended.  Electronically Signed KP:[email or web address] Milinda Pointer, MD Electronically Signed on:07/19/2013 1:29 PM Mediastinum/Nodes: No mediastinal lymphadenopathy. Small mediastinal lymph nodes have subtle mineralization evident. Irregular Soft tissue fullness in the hilar regions is similar to prior scan. The esophagus has normal imaging features. There is no axillary lymphadenopathy.  Lungs/Pleura: The central tracheobronchial airways are patent. 7 mm left upper lobe pulmonary nodule is stable in the interval. Interval development of numerous, right greater than left, irregular bilateral pulmonary nodules. There is diffuse Perilymphatic thickening and nodularity, right greater than left, progressive in the interval with nodular thickening of the right major fissure progressive since prior study. These changes are associated  with complete collapse of the right middle lobe. No pleural effusion  IMPRESSION: 1. Interval progression of bilateral perilymphatic thickening and nodularity, right greater than left. Imaging features are compatible with the patient's known history of sarcoidosis. 2. Interval development of complete collapse right middle lobe. Presumably also secondary to sarcoidosis and hilar involvement, but close follow-up recommended to exclude central obstructing lesion un related to sarcoidosis. 3.  Aortic Atherosclerois (ICD10-170.0)   Electronically  Signed   By: Misty Stanley M.D.   On: 05/21/2018 09:54     OV 08/24/2018  Subjective:  Patient ID: [name], female , DOB: 09-07-1930 , age 23 y.o. , MRN: [id number] , ADDRESS: [address] [place] [facility] [address]   08/24/2018 -   Chief Complaint  Patient presents with  . Follow-up    PFT performed 08/23/2018.  Pt states she has been doing well since last visit and denies any complaints.     HPI Jayma Volpi Kimm 85 y.o. -+ returns for follow-up of her sarcoidosis with cough and middle lobe syndrome.  She is now on 5 mg prednisone.  She increase it from 4 mg prednisone because of cough.  With this the cough is well controlled.  The issue more recently is that in 2019 she had a CT chest that shows progression of her sarcoid compared to 2011.  However in the interim she has had CT scans at [facility] in 2012 and 2014.  Ideally we would need to make a comparison to the most recent CT scan it appears at least based on reports the CT scans at [facility] in 2014 and [place] in 2019 might be similar.  However it is challenging to get radiology discs from outside institutions.  Therefore, we decided to look for progression with a pulmonary function test today which is essentially normal except for mild reduction in DLCO.  Incidentally, we try to get her chest x-ray image from primary care physician locally and so far we have not been able to do that.  Currently she is is also asymptomatic.  She is asymptomatic on 5 mg prednisone.   Results for JAMEEKA, MARCY (MRN [id number]) as of 08/24/2018 13:53  Ref. Range 08/23/2018 09:37  FEV1-Pre Latest Units: L 2.00  FEV1-%Pred-Pre Latest Units: % 127  Pre FEV1/FVC ratio Latest Units: % 72  Results for ESMA, KILTS (MRN [id number]) as of 08/24/2018 13:53  Ref. Range 08/23/2018 09:37  FEV1-Pre Latest Units: L 2.00  FEV1-%Pred-Pre Latest Units: % 127  Pre FEV1/FVC ratio Latest Units: % 72    ROS - per HPI     has a past medical  history of Arthritis, Cervical spondylosis, Chest pain (07/28/2008), Collapsed lung ( 13/24), Complication of anesthesia, Dislocation closed, shoulder (7/14), DVT (deep venous thrombosis) ([place]) (2006), GERD (gastroesophageal reflux disease), Headache, High cholesterol, Hypertension, Hypothyroidism, Peripheral vascular disease ([place]) (2006), PONV (postoperative nausea and vomiting), Sarcoidosis, and Sarcoidosis of lung ([place]).   reports that she has never smoked. She has never used smokeless tobacco.  Past Surgical History:  Procedure Laterality Date  . EYE SURGERY    . FOOT SURGERY Left 06/2005   bunion hammer toe  . HEMORRHOID SURGERY  12/1972  . INTRAOCULAR LENS INSERTION     right eye 10 /16 and left 06/16/2005  . JOINT REPLACEMENT    . SKIN BIOPSY  01/2007   basal cell carcinoma removed from right lower leg   . TOTAL HIP ARTHROPLASTY  06/2007   left  .  TOTAL HIP ARTHROPLASTY Right 08/26/2016   Procedure: TOTAL HIP ARTHROPLASTY ANTERIOR APPROACH;  Surgeon: Melrose Nakayama, MD;  Location: [employer];  Service: Orthopedics;  Laterality: Right;  . TOTAL KNEE ARTHROPLASTY Right 10/2007   right    Allergies  Allergen Reactions  . Methotrexate Derivatives     Mild alopecia     Immunization History  Administered Date(s) Administered  . DTaP 12/10/1995  . Hepatitis A 12/10/1999  . Influenza Split 04/11/2013, 04/11/2014  . Influenza Whole 04/15/2010  . Influenza, High Dose Seasonal PF 04/17/2018  . Influenza,inj,Quad PF,6+ Mos 05/07/2015, 04/11/2016  . Influenza-Unspecified 05/09/2017  . Pneumococcal Polysaccharide-23 05/13/2004  . Pneumococcal-Unspecified 08/11/2010, 03/11/2014  . Td 02/08/2002  . Tdap 08/11/2012  . Zoster 06/12/2007    Family History  Problem Relation Age of Onset  . Diabetes Mother   . Heart failure Mother   . Hypertension Father   . Diabetes Father   . Heart disease Father   . Heart attack Father      Current Outpatient Medications:  .  amLODipine  (NORVASC) 10 MG tablet, Take 10 mg by mouth daily., Disp: , Rfl:  .  azelastine (OPTIVAR) 0.05 % ophthalmic solution, Place 1 drop into both eyes 2 (two) times daily. , Disp: , Rfl:  .  fluticasone (FLONASE) 50 MCG/ACT nasal spray, Place 2 sprays into both nostrils daily., Disp: 48 g, Rfl: 5 .  folic acid (FOLVITE) 1 MG tablet, Take 1 tablet (1 mg total) by mouth daily., Disp: 90 tablet, Rfl: 1 .  gemfibrozil (LOPID) 600 MG tablet, Take 600 mg by mouth 2 (two) times daily before a meal., Disp: , Rfl:  .  levothyroxine (SYNTHROID, LEVOTHROID) 25 MCG tablet, Take 25 mcg by mouth daily. , Disp: , Rfl:  .  LORazepam (ATIVAN) 0.5 MG tablet, Take 0.5 mg by mouth every 8 (eight) hours as needed for anxiety or sleep. , Disp: , Rfl:  .  losartan (COZAAR) 50 MG tablet, Take 50 mg by mouth daily., Disp: , Rfl:  .  Multiple Vitamin (MULTIVITAMIN) capsule, Take 1 capsule by mouth daily., Disp: , Rfl:  .  omeprazole (PRILOSEC) 20 MG capsule, Take 20 mg by mouth daily as needed (heartburn). , Disp: , Rfl: 7 .  Polyethylene Glycol 3350 (GLYCOLAX PO), Take by mouth., Disp: , Rfl:  .  predniSONE (DELTASONE) 5 MG tablet, Take 1 tablet (5 mg total) by mouth daily with breakfast., Disp: 90 tablet, Rfl: 1 .  UNABLE TO FIND, Med Name: CA 2- 600mg  qd, Disp: , Rfl:       Objective:   Vitals:   08/24/18 1345  BP: 126/62  Pulse: 60  SpO2: 99%  Weight: 138 lb (62.6 kg)  Height: 5\' 2"  (1.575 m)    Estimated body mass index is 25.24 kg/m as calculated from the following:   Height as of this encounter: 5\' 2"  (1.575 m).   Weight as of this encounter: 138 lb (62.6 kg).  @WEIGHTCHANGE @  [employer]   08/24/18 1345  Weight: 138 lb (62.6 kg)     Physical Exam  General Appearance:    Alert, cooperative, no distress, appears stated age - no , Deconditioned looking - no , OBESE  - no, Sitting on Wheelchair -  no  Head:    Normocephalic, without obvious abnormality, atraumatic  Eyes:    PERRL,  conjunctiva/corneas clear,  Ears:    Normal TM's and external ear canals, both ears  Nose:   Nares normal, septum midline, mucosa  normal, no drainage    or sinus tenderness. OXYGEN ON  -no . Patient is @ ra   Throat:   Lips, mucosa, and tongue normal; teeth and gums normal. Cyanosis on lips - no  Neck:   Supple, symmetrical, trachea midline, no adenopathy;    thyroid:  no enlargement/tenderness/nodules; no carotid   bruit or JVD  Back:     Symmetric, no curvature, ROM normal, no CVA tenderness  Lungs:     Distress - no , Wheeze no, Barrell Chest - no, Purse lip breathing - no, Crackles - no   Chest Wall:    No tenderness or deformity.    Heart:    Regular rate and rhythm, S1 and S2 normal, no rub   or gallop, Murmur - no  Breast Exam:    NOT DONE  Abdomen:     Soft, non-tender, bowel sounds active all four quadrants,    no masses, no organomegaly. Visceral obesity - no  Genitalia:   NOT DONE  Rectal:   NOT DONE  Extremities:   Extremities - normal, Has Cane - no, Clubbing - no, Edema - no  Pulses:   2+ and symmetric all extremities  Skin:   Stigmata of Connective Tissue Disease - no  Lymph nodes:   Cervical, supraclavicular, and axillary nodes normal  Psychiatric:  Neurologic:   Pleasant - yes, Anxious - no, Flat affect - no  CAm-ICU - neg, Alert and Oriented x 3 - yes, Moves all 4s - yes, Speech - normal, Cognition - intact           Assessment:       ICD-10-CM   1. 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 Chronic cough R05        Plan:     Patient Instructions     ICD-10-CM   1. 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 Brand Males, M.D., F.C.C.P,  Pulmonary and Critical Care Medicine Staff Physician, [place] Director - Interstitial Lung Disease  Program  [facility] [place] at [place], [place], [address]  Pager: [phone], If no answer or between  15:00h - 7:00h: call [phone] Telephone: [phone]  2:13 PM 08/24/2018

## 2018-08-26 NOTE — Telephone Encounter (Signed)
LMTCB

## 2018-08-30 NOTE — Telephone Encounter (Signed)
The request was faxed to [facility] on 08/26/2018. Still awaiting.

## 2018-08-30 NOTE — Telephone Encounter (Signed)
Called and spoke with patient, she stated that MR had asked the patient if she had the disk from when she was at [facility]. Patient does not have these but would MR to obtain these from [facility]. Spoke with Raquel Sarna and she stated that she has the papers for this. Raquel Sarna please advise on this, thank you.

## 2018-08-31 NOTE — Telephone Encounter (Signed)
Will route to [facility] to document and follow up on once this is received.

## 2018-09-08 NOTE — Telephone Encounter (Signed)
I still have not received a CD of pt's scans from [facility].

## 2018-09-08 NOTE — Telephone Encounter (Signed)
[name] Page - can you please advise if this has been taken care of. Thanks!

## 2018-09-10 NOTE — Telephone Encounter (Signed)
I still have not received a CD yet of pt's scan. [facility] at [phone] and spoke with Anderson Malta in regards to the release of records of pt's CT scans and stated to her that we still had not heard anything. Per Anderson Malta, they forwarded the request to Radiology 1/16. Was transferred to Radiology and the tech I spoke with stated with the request being sent to them 1/16, they would not have the request still.  Stated to me to fax the request directly to radiology dept at [phone] in attn radiology.   Request has been faxed. Waiting for response.

## 2018-09-10 NOTE — Telephone Encounter (Signed)
[name] Page, please advise if CD's received at this time.

## 2018-09-13 NOTE — Telephone Encounter (Signed)
After calling last week 09/10/2018, I still haven't received anything or heard anything back.

## 2018-09-13 NOTE — Telephone Encounter (Signed)
Have you received response from fax, or CD's?

## 2018-09-14 NOTE — Telephone Encounter (Signed)
[facility] Radiology dept at [phone] and spoke with [place]. Once I told Loreli Slot the reason for my call, she transferred me to the file room of radiology. When transferred to file room of radiology dept, was unable to speak to someone. Left a detailed message stating that we are trying to get a status update on CD of pt's CT. Will await a return call.

## 2018-09-14 NOTE — Telephone Encounter (Signed)
Called [facility] and was unable to speak to anyone as they were closed. Will forward this over to [facility] to follow up on.

## 2018-09-14 NOTE — Telephone Encounter (Signed)
Received a fax from [facility] in regards to pt's CT. There is a message stating that the images will be placed in [place] via [place]. We did receive a fax of the written report from pt's CT which has been given to MR. Nothing further needed.

## 2018-11-26 ENCOUNTER — Telehealth: Payer: Self-pay | Admitting: Internal Medicine

## 2018-11-26 MED ORDER — PREDNISONE 5 MG PO TABS: 5.0000 mg | ORAL_TABLET | Freq: Every day | ORAL | 1 refills | Status: DC

## 2018-11-26 NOTE — Telephone Encounter (Signed)
Called and spoke with patient and husband. Let them know the order for the prednisone was placed with one refill and for her to keep her follow up appointment that will be scheduled for July with Dr. Chase Caller.   Nothing further needed at this time.

## 2019-02-08 ENCOUNTER — Encounter: Payer: Self-pay | Admitting: Internal Medicine

## 2019-03-14 ENCOUNTER — Other Ambulatory Visit: Payer: Self-pay | Admitting: Internal Medicine

## 2019-03-14 ENCOUNTER — Ambulatory Visit (HOSPITAL_COMMUNITY)
Admission: RE | Admit: 2019-03-14 | Discharge: 2019-03-14 | Disposition: A | Discharge status: Home / Self Care | Payer: Medicare Other | Source: Ambulatory Visit | Attending: Internal Medicine | Admitting: Internal Medicine

## 2019-03-14 ENCOUNTER — Other Ambulatory Visit: Payer: Self-pay

## 2019-03-14 DIAGNOSIS — G43809 Other migraine, not intractable, without status migrainosus: Secondary | ICD-10-CM | POA: Insufficient documentation

## 2019-03-14 DIAGNOSIS — H53149 Visual discomfort, unspecified: Secondary | ICD-10-CM

## 2019-03-14 DIAGNOSIS — G43009 Migraine without aura, not intractable, without status migrainosus: Secondary | ICD-10-CM | POA: Insufficient documentation

## 2019-03-14 MED ORDER — GADOBUTROL 1 MMOL/ML IV SOLN
7.0000 mL | Freq: Once | INTRAVENOUS | Status: AC | PRN
  Administered 2019-03-14: 7 mL via INTRAVENOUS

## 2019-05-02 ENCOUNTER — Other Ambulatory Visit: Payer: Self-pay

## 2019-05-02 ENCOUNTER — Ambulatory Visit (INDEPENDENT_AMBULATORY_CARE_PROVIDER_SITE_OTHER): Payer: Medicare Other | Admitting: Neurology

## 2019-05-02 ENCOUNTER — Encounter: Payer: Self-pay | Admitting: Neurology

## 2019-05-02 ENCOUNTER — Telehealth: Payer: Self-pay | Admitting: Neurology

## 2019-05-02 VITALS — BP 173/70 | HR 62 | Ht 62.0 in | Wt 138.0 lb

## 2019-05-02 DIAGNOSIS — G4489 Other headache syndrome: Secondary | ICD-10-CM | POA: Diagnosis not present

## 2019-05-02 DIAGNOSIS — Z5181 Encounter for therapeutic drug level monitoring: Secondary | ICD-10-CM | POA: Diagnosis not present

## 2019-05-02 DIAGNOSIS — I679 Cerebrovascular disease, unspecified: Secondary | ICD-10-CM

## 2019-05-02 NOTE — Telephone Encounter (Signed)
[employer] medicare order sent to GI. No auth require. GI will reach out to the patient to schedule.

## 2019-05-02 NOTE — Progress Notes (Signed)
Reason for visit: Headache  Referring physician: Dr. Stann Ore is a 85 y.o. female  History of present illness:  [name] Page is an 85 year old right-handed white female with a history of sarcoidosis.  The patient was having some headaches in 2011 and 2012, the etiology of initially was not clear but in 2013 she had a biopsy done of the eye, and the diagnosis of sarcoidosis was confirmed.  The patient does have some pulmonary involvement and involvement of the throat as well.  The patient has been able to manage her sarcoidosis with very low-dose prednisone taking only 5 mg daily.  In late July 2020, the patient began having onset of headaches that lasted about 10 days and then cleared.  The headaches involved the left occipital area but she also had pain into the ear and into the jaw on the left.  The patient had no visual alterations with the headache, she reported no definite photophobia or phonophobia, she had no nausea or vomiting.  She did have some slight dizziness and with the dizziness she felt slightly unsteady but did not have any falls.  She reported no focal numbness or weakness of the face, arms, legs.  She had no confusion with the headache.  The patient was placed on a 6-day prednisone taper, the patient does not believe that the prednisone helped her headache much.  The headaches resolved on their own, she has remained on 5 mg daily of prednisone, she has some mild residual left ear pain.  The patient reports no neck stiffness or neck pain.  She is sent to this office for an evaluation.  MRI of the brain was done showing evidence of extensive white matter changes, with no enhancement to suggest sarcoidosis, no acute changes were seen.  Past Medical History:  Diagnosis Date  . Arthritis   . Cervical spondylosis    C3-4 AND C4-5  . Chest pain 07/28/2008   H/O, normal stress nuclear EF 78%  . Collapsed lung  12/11   being treated at [facility] - in left lung 85 .  Complication of anesthesia   . Dislocation closed, shoulder 7/14  . DVT (deep venous thrombosis) ([place]) 2006  . GERD (gastroesophageal reflux disease)    occ  . Headache    hx  . High cholesterol   . Hypertension   . Hypothyroidism   . Peripheral vascular disease ([place]) 2006   dvt's and 50 yrs ago  . PONV (postoperative nausea and vomiting)   . Sarcoidosis   . Sarcoidosis of lung [facility])     Past Surgical History:  Procedure Laterality Date  . EYE SURGERY    . FOOT SURGERY Left 06/2005   bunion hammer toe  . HEMORRHOID SURGERY  83/1974  . INTRAOCULAR LENS INSERTION     right eye 10 /16 and left 06/16/2005  . JOINT REPLACEMENT    . SKIN BIOPSY  01/2007   basal cell carcinoma removed from right lower leg   . TOTAL HIP ARTHROPLASTY  06/2007   left  . TOTAL HIP ARTHROPLASTY Right 08/26/2016   Procedure: TOTAL HIP ARTHROPLASTY ANTERIOR APPROACH;  Surgeon: Melrose Nakayama, MD;  Location: [place];  Service: Orthopedics;  Laterality: Right;  . TOTAL KNEE ARTHROPLASTY Right 10/2007   right    Family History  Problem Relation Age of Onset  . Diabetes Mother   . Heart failure Mother   . Hypertension Father   . Diabetes Father   . Heart disease  Father   . Heart attack Father     Social history:  reports that she has never smoked. She has never used smokeless tobacco. She reports current alcohol use. She reports that she does not use drugs.  Medications:  Prior to Admission medications   Medication Sig Start Date End Date Taking? Authorizing Provider  amLODipine (NORVASC) 10 MG tablet Take 10 mg by mouth daily.   Yes [provider]  azelastine (OPTIVAR) 0.05 % ophthalmic solution Place 1 drop into both eyes 2 (two) times daily.    Yes [provider]  fluticasone (FLONASE) 50 MCG/ACT nasal spray Place 2 sprays into both nostrils daily. 02/23/18  Yes Brand Males, MD  gemfibrozil (LOPID) 600 MG tablet Take 600 mg by mouth 2 (two) times daily before a meal.   Yes  [provider]  levothyroxine (SYNTHROID, LEVOTHROID) 25 MCG tablet Take 25 mcg by mouth daily.    Yes [provider]  LORazepam (ATIVAN) 0.5 MG tablet Take 0.5 mg by mouth every 8 (eight) hours as needed for anxiety or sleep.  03/26/15  Yes [provider]  losartan (COZAAR) 50 MG tablet Take 50 mg by mouth daily.   Yes [provider]  Multiple Vitamin (MULTIVITAMIN) capsule Take 1 capsule by mouth daily.   Yes [provider]  Polyethylene Glycol 3350 (GLYCOLAX PO) Take by mouth.   Yes [provider]  predniSONE (DELTASONE) 5 MG tablet Take 1 tablet (5 mg total) by mouth daily with breakfast. 11/26/18  Yes Brand Males, MD  UNABLE TO FIND Med Name: CA 2- 600mg  qd   Yes [provider]      Allergies  Allergen Reactions  . Methotrexate Derivatives     Mild alopecia     ROS:  Out of a complete 14 system review of symptoms, the patient complains only of the following symptoms, and all other reviewed systems are negative.  Easy bruising Headache Dizziness  Blood pressure (!) 173/70, pulse 62, height 5\' 2"  (1.575 m), weight 138 lb (62.6 kg).  Physical Exam  General: The patient is alert and cooperative at the time of the examination.  Eyes: Pupils are equal, round, and reactive to light. Discs are flat bilaterally.  Ears: Tympanic membranes are clear bilaterally  Neck: The neck is supple, no carotid bruits are noted.  Respiratory: The respiratory examination is clear.  Cardiovascular: The cardiovascular examination reveals a regular rate and rhythm, no obvious murmurs or rubs are noted.  Skin: Extremities are without significant edema.  Neurologic Exam  Mental status: The patient is alert and oriented x 3 at the time of the examination. The patient has apparent normal recent and remote memory, with an apparently normal attention span and concentration ability.  Cranial nerves: Facial symmetry is present.  There is good sensation of the face to pinprick and soft touch bilaterally. The strength of the facial muscles and the muscles to head turning and shoulder shrug are normal bilaterally. Speech is well enunciated, no aphasia or dysarthria is noted. Extraocular movements are full. Visual fields are full. The tongue is midline, and the patient has symmetric elevation of the soft palate. No obvious hearing deficits are noted.  Motor: The motor testing reveals 5 over 5 strength of all 4 extremities. Good symmetric motor tone is noted throughout.  Sensory: Sensory testing is intact to pinprick, soft touch, vibration sensation, and position sense on all 4 extremities. No evidence of extinction is noted.  Coordination: Cerebellar testing reveals good  finger-nose-finger and heel-to-shin bilaterally.  Gait and station: Gait is normal. Tandem gait is normal. Romberg is negative. No drift is seen.  Reflexes: Deep tendon reflexes are symmetric and normal bilaterally. Toes are downgoing bilaterally.   MRI brain 03/14/19:  IMPRESSION: No acute finding by MRI today. Brain atrophy. Extensive chronic small-vessel ischemic changes throughout the brain. No sign of brain or leptomeningeal enhancement to suggest active sarcoid presently. I do not see evidence of orbital disease, though a detailed orbital exam was not performed and there is a good bit of motion degradation. No abnormality seen to explain left-sided headache.  * MRI scan images were reviewed online. I agree with the written report.    Assessment/Plan:  1.  History of sarcoidosis  2.  Headache, left occipital   3.  Cerebrovascular disease  The patient has some resolution of the left-sided headache at this point.  The etiology of the headache is not clear.  The patient generally does not have headaches, the last time she did have headaches she had active sarcoidosis.  The patient will have blood work today, she will be set up for a CT  angiogram of the head and neck given the extensive small vessel changes, a left vertebral artery dissection will need to be excluded.  If the headaches return and the above testing is unremarkable, lumbar puncture may be done looking for evidence of CNS sarcoidosis.  Jill Alexanders MD 05/02/2019 9:55 AM  [facility] [address] [place],  [address]  Phone [phone] Fax [phone]

## 2019-05-03 LAB — COMPREHENSIVE METABOLIC PANEL
ALT: 10 IU/L (ref 0–32)
AST: 20 IU/L (ref 0–40)
Albumin/Globulin Ratio: 1.3 (ref 1.2–2.2)
Albumin: 4.5 g/dL (ref 3.6–4.6)
Alkaline Phosphatase: 83 IU/L (ref 39–117)
BUN/Creatinine Ratio: 22 (ref 12–28)
BUN: 16 mg/dL (ref 8–27)
Bilirubin Total: 0.4 mg/dL (ref 0.0–1.2)
CO2: 22 mmol/L (ref 20–29)
Calcium: 10 mg/dL (ref 8.7–10.3)
Chloride: 105 mmol/L (ref 96–106)
Creatinine, Ser: 0.73 mg/dL (ref 0.57–1.00)
GFR calc Af Amer: 87 mL/min/{1.73_m2} (ref >59)
GFR calc non Af Amer: 75 mL/min/{1.73_m2} (ref >59)
Globulin, Total: 3.4 g/dL (ref 1.5–4.5)
Glucose: 115 mg/dL — ABNORMAL HIGH (ref 65–99)
Potassium: 5.7 mmol/L — ABNORMAL HIGH (ref 3.5–5.2)
Sodium: 142 mmol/L (ref 134–144)
Total Protein: 7.9 g/dL (ref 6.0–8.5)

## 2019-05-03 LAB — SEDIMENTATION RATE: Sed Rate: 33 mm/hr (ref 0–40)

## 2019-05-03 LAB — C-REACTIVE PROTEIN: CRP: <1 mg/L (ref 0–10)

## 2019-05-26 ENCOUNTER — Other Ambulatory Visit: Payer: Self-pay

## 2019-05-26 ENCOUNTER — Ambulatory Visit: Payer: Medicare Other | Admitting: Internal Medicine

## 2019-05-26 ENCOUNTER — Encounter: Payer: Self-pay | Admitting: Internal Medicine

## 2019-05-26 VITALS — BP 132/64 | HR 53 | Temp 97.4°F | Ht 62.0 in | Wt 135.8 lb

## 2019-05-26 DIAGNOSIS — R053 Chronic cough: Secondary | ICD-10-CM

## 2019-05-26 DIAGNOSIS — D869 Sarcoidosis, unspecified: Secondary | ICD-10-CM | POA: Diagnosis not present

## 2019-05-26 DIAGNOSIS — J9819 Other pulmonary collapse: Secondary | ICD-10-CM

## 2019-05-26 DIAGNOSIS — R05 Cough: Secondary | ICD-10-CM | POA: Diagnosis not present

## 2019-05-26 MED ORDER — PREDNISONE 5 MG PO TABS: 5.0000 mg | ORAL_TABLET | Freq: Every day | ORAL | 1 refills | Status: DC

## 2019-05-26 MED ORDER — FLUTICASONE PROPIONATE 50 MCG/ACT NA SUSP: 2.0000 | Freq: Every day | NASAL | 5 refills | Status: DC

## 2019-05-26 NOTE — Progress Notes (Signed)
OV 08/24/2014  Chief Complaint  Patient presents with   Follow-up    Pt stated the gabapentin has not changed her breathing or cough. Pt c/o prod cough with white mucus. Pt denies SOB and CP/tightness.    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 Main symptom is severe chronic cough with high RSI score suggestive of irritable larynx syndrome that is associated as well  She always finds prednisone to help the cough but due to age and side effects despite good functional status she opts to take a low-dose of prednisone. Currently she is on 5 mg prednisone. In November 2015 due to RSI score of 27 for cough but decided to trial gabapentin to tackle the neurogenic component of cough. She reports now that the gabapentin has not helped her at all although objectively the RSI cough score has drop to 20. She is very frustrated. She and her husband are reluctant to have right middle lobectomy given her advanced age despite good functional status. There are no open to trying methotrexate to tackle the inflammation of sarcoidosis and as a steroid sparing agent. She also wants to come off gabapentin   OV 10/02/2014  Chief Complaint  Patient presents with   Follow-up    Pt states her breathing is doing well. Pt states her SOB has improved since last OV. Pt c/o mild cough. Pt denies CP/tightness. Pt stated being on methotrexate has significantly helped.     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 Main symptom is severe chronic cough with high RSI score suggestive of irritable larynx syndrome that is associated as well    Last visit in January 2016 she told me that gabapentin did not help her cough. So we stopped this. We started methotrexate for inflammation in her airway due to  sarcoid causing right middle lobe collapse. We left her on prednisone 5 mg per day and started her on  methotrexate 5 mg once a week. Also gave Hycodan cough syrup. She says that the Hycodan cough syrup did not help However, 2 weeks after starting her methotrexate she started noticing dramatic improvement in her cough. She hardly coughs now. She has significant improvement in her quality of life and she is extremely happy.  RSI cough score had dropped from 25 to 20 with gabapentin has now dropped to 5.  She is asking questions about how long to take methotrexate. And also review of the side effect profile. Of note I did notice that she is on ACE inhibitor that can potentially provoke all make her cough worse. She is not taking gabapentin anymore. There is no fever or chills. At this point in time she's not interested in Bactrim PCP prophylaxis which should be okay given low-dose of methotrexate and prednisone  Past, Family, Social reviewed: no change since last visit    Cyrus 01/02/2015  Chief Complaint  Patient presents with   Follow-up    Pt recently went to opthamologist and pts vision has decreased since last OV. Pt stated her breathing is unchanged since last OV. Pt c/o mild dry cough. Pt denies CP/tightness.     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 Main symptom is severe chronic cough with high RSI score suggestive of irritable larynx syndrome that is associated as well  Cough /Saarcoid / IRritiable LArynx/ RML syundrome - cough persists but is only mild and baseline.  No problems. Does not feel it impacts qualkity of life. She is off ace inhibitor now  ; forgot to mention this during med reconciliation. No asociated wheeze or dyspnea. Maintained on mehtotrxate 5mg  weekly, folic acid and prednisone 5mg  daily. No evidence of infection. Last LFT was 3 months ago and normal. Hycodan helping cough and wants refill  NEw issue - her eye doc Dr Ellie Lunch called yesterday. PAtient has no complaints of vision but post capsulootmy few week ago and now  yesterday visual field deficit on Rt side is worse. Concern is ocular sarcoid is worse. They are referring her to [facility]. PAtient does not want step up Rx of sarcoid till all this is sorted out  New issue - noticed some bruising in left forearm yesterday sponatenous x 2 spots. New. Asytmptomatcic and last 1 weeks some punctate red lesion in distal 1/3 of leg. Associatd with gardening but denies poison ivy. Unchanged since insidious onset. No associatd fever.    OV 04/09/2015  Chief Complaint  Patient presents with   Follow-up    Pt denies cough, SOB, CP/tightness. Pt states her breathing is doing well. Pt states she had a recent rash on BIL lower extremities and saw derm and was given a topical ointment that helped. Per pt the derm thought the rash was not related to sarcoid.     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 Main symptom is severe chronic cough with high RSI score suggestive of irritable larynx syndrome that is associated as well   Cough /Saarcoid / IRritiable LArynx/ RML syundrome - cough is significantly improved. RSI cough score is only 14. She feels that the cough is at a stage where it is completely fine and she can handle things. She is maintained on methotrexate 5 mg weekly, folic acid and prednisone 5 mg daily. She did have some lowish white count after last visit and anemia but we held to the methotrexate and it seems to be stable. She also had a rash at the time of last visit she has seen dermatology and apparently the biopsies do not show this is due to sarcoid. I personally review this outside chart and confirmed that. Of note her last chest x-ray was November 2015 which showed continued right middle lobe collapse. The cough is controlled with the methotrexate and prednisone. She is no longer on ACE inhibitor even though our MAR reports that Also noted that she reports some mild weight gain with prednisone but she says she  will try dietary measures to shake this weight off. She does not want to cough to come and therefore she is content continue with the methotrexate and prednisone. In terms of her vision she is waiting for [facility] referral to [facility]mology in October 2016.    OV 07/16/2015  Chief Complaint  Patient presents with   Follow-up    Pt states her breathing is unchanged but her cough has worsened. Pt states her SOB is at her baseline. Pt states her cough is nonproductive and worse in the evening. Pt denies CP/tightness.     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 Main symptom is severe chronic cough with high RSI score suggestive of irritable larynx syndrome that is associated as well   Cough /Saarcoid / IRritiable LArynx/ RML syundrome -she now reports that her cough is worse for the last few months especially since last visit. RSI cough score is 18 and subjectively  she feels that cough is worse by 25%. Otherwise she is okay. Cough is associated with worsening postnasal drip. She is also asking for refills on a cough syrup methotrexate folic acid. She prefers to have 90 day refills. Despite cough worsened she is not too keen on gabapentin therapy and wants to try other simpler measures at this point in time. There is no fever or sputum production that is associated with the cough.  In terms of her ophthalmology issues she has been reassured apparently it is a left optic now that is problematic in this 50% gone. No active follow-up.   OV 10/18/2015   Chief Complaint  Patient presents with   Follow-up    Pt states her cough has significantly improved. Pt denies SOB and CP/tightness. Pt states overall she feels she is doing well.     Follow-up chronic cough associated with right middle lobe syndrome secondary to sarcoidosis and irritable larynx syndrome  Last visit December 2016. At the time cough that deteriorated. The significant  amount of postnasal drip. We instituted nasal steroids and with this cough is significantly improved and back to baseline. RSI cough score is 10 which is around her baseline. She feels immensely better. She is in a good phase of her health. There are no new issues. She is pending $24 and over-the-counter nasal steroid and we discussed about doing a generic prescription which might be cheaper. She wants refill postdated on her cough syrup. She wants 90 day refills on her methotrexate. She is happy with having a next follow-up in 6 months.   OV 04/22/2016  Chief Complaint  Patient presents with   Follow-up    Pt states her breathing is at baseline. Pt states she is doing well. Pt states has a rare cough, not needing cough syrup any longer. Pt denies CP/tightness and f/c/s.      follow-up chronic cough associated with right middle lobe syndrome secondary to sarcoidosis [also ocular sarcoid] and irritable larynx syndrome  Last visit was me was in March 2017. In the interim she has visited Designer, jewellery. She is on prednisone 5 mg per day and also methotrexate 5 mg once a week.Her cough is significantly improved. She had a chest x-ray May 2017 that I personally visualized shows significant improvement in her right middle lobe laps compared to 2011/2012. She's not on the needing cough syrup. She has right hip DJD now and apparently hip surgery might be contemplated. She is using nasal ster  oid. She does not do Bactrim prophylaxis. RSI cough score show significant improvement with score of 6 which is only mild cough.   OV 10/28/2016  Chief Complaint  Patient presents with   Follow-up    sarcoidosis, wants to discuss hair loss    Fu Chronic cough due to right middle lobe syndrome due to sarcoidosis with associated with ocular sarcoidosis. On methotrexate 5 mg once weekly associated with prednisone low dose 4 mg/5 mg   She continues to do well. Cough is improved 1 RSI cough score 5 is  documented below. In January 2008 and she had replacement and she had a clear chest x-ray that I personally visualized. The cough is only mild. She has a new problem in the last 6 months of mild alopecia that is persistent and stable. Not associated with any pain or radiation or aggravating or relieving factors. It is associated with methotrexate intake. She wants to know about taking Biotene supplements that was advised by the hairdresser. Husband is  wondering if methotrexate is the etiology for this. Methotrexate can cause less than 10% alopecia not otherwise specified.   OV 01/13/2017  Chief Complaint  Patient presents with   Follow-up    Pt states her breathing is baseline. Pt states she has weaned off the methotrexate. Pt states she has mild dry cough. Pt denies CP/tightness.     85 year old female follow-up sarcoidosis associated with severe chronic cough right middle lobe syndrome and associated ocular sarcoidosis. Previously on methotrexate but low-dose prednisone currently.  Last visit in March 2018 because of good control cough and new onset of alopecia has mild he stopped the methotrexate. After this she is here for follow-up. She says that her cough has not gotten worse in the absence of methotrexate. Of note methotrexate was the only agent to improve her cough a few years ago. Her alopecia is improving after stopping the methotrexate. She strongly believes the methotrexate as the cause of alopecia and is agreeable for methotrexate to be listed in her allergies. She is continuing 5 mg of redness on her day. She plans to continue this because of ocular sarcoidosis as well. Her weight is causing some easy bruising which she is accepting. Today RSI cough score is only 3 and shows good control of cough    OV 11/10/2017  Chief Complaint  Patient presents with   Follow-up    Pt states she has been doing well since last visit and denies any complaints or concerns.     85 year old female  follow-up sarcoidosis associated with severe chronic cough right middle lobe syndrome and associated ocular sarcoidosis. Previously on methotrexate as of 2018 but low-dose prednisone currently through 2019. LAsT CXR Jan 2018 - clear   Follow-up chronic cough related to the above. She is now off methotrexate for over a year. Her alopecia has resolved. Her hair is back. She feels happy. She is on 5 mg prednisone a day. She feels well. Cough is minimal to nonexistent. She has ophthalmology appointment pending. I thought the current prednisone is because of her ocular sarcoid but she thinks it might be because of pulmonary and ocular sarcoid. She is willing to taper her prednisone down to 4 mg per day. Her husband is here with her as always. She is up-to-date with flu shot.She feels well without any respiratory symptoms of fatigue. No new medical problems or ER visits   OV 05/25/2018  Subjective:  Patient ID: [name], female , DOB: March 19, 1931 , age 40 y.o. , MRN: [id number] , ADDRESS: [address] [place] [facility] [address]   05/25/2018 -   Chief Complaint  Patient presents with   Follow-up    Doing well at this time.     HPI YARIMAR REINHART 85 y.o. -     OV 05/25/2018  Chief Complaint  Patient presents with   Follow-up    Doing well at this time.     85 year old female follow-up sarcoidosis (bx proven LUL dec 2014 at [facility]) associated with severe chronic cough right middle lobe syndrome and associated ocular sarcoidosis. Previously on methotrexate as of 2018 but low-dose prednisone currently through 2019. LAsT CXR Jan 2018 - clear   Last seen in April 2019.  At that time she was doing asymptomatic.  She currently is asymptomatic but this is a quicker than expected follow-up.  This is because she went to her primary care physician and she talked about her right middle lobe syndrome.  Therefore a chest x-ray was done and this  is not normal.  Therefore a CT was recommended.  She  then called my office.  We decided to get a CT chest and see her today.  She and her husband insist that she is still asymptomatic.  She is doing well on 4 mg prednisone.  She is exercising regularly.  No cough, no wheeze, no hemoptysis no sputum.  However CT scan of the chest October 2019 when compared to 2011 is significantly worse with worsening infiltrates and right middle lobes collapse and also beginning stages of left upper lobe collapse.  Most of her decline seem to happen in the early years of the diagnosis of sarcoidosis between 2011 and 2015.  In 2014 she did have a CT scan of the chest at [facility].  I do not have the images to compare but it appears that most of the report from 2014 and the current 2019 report might just be the same or perhaps the infiltrates are worse.  It is unclear.  The best comparison might be chest x-ray to chest x-ray because the most recent chest x-ray was 2018.  Also pulmonary function testing is been a while since we did one.    CT dec 2014 at [place]  - unable to see image - local comparison [facility] 2012, 2014 CT chest 05/21/2018 at [facility] - [facility]  There is occlusion of the right middle lobe bronchus with atelectasis of the majority of the right middle lobe. There is also occlusion of the left upper lobe bronchus proximally with associated soft tissue, increased from prior. There is diffuse mosaic attenuation which likely represents air trapping.  Impression:  1. New 5 mm left upper lobe pulmonary nodule. 6-12 month followup is recommended. Other pulmonary nodules are stable.  2. Persistent occlusion of the right middle lobe bronchus proximally with near complete right middle lobe atelectasis. There is occlusion of the left upper lobe bronchus which is slightly increased from the prior study with adjacent soft tissue. Attention on followup is recommended.  Electronically Signed [id number] Milinda Pointer, MD Electronically Signed on:07/19/2013  1:29 PM Mediastinum/Nodes: No mediastinal lymphadenopathy. Small mediastinal lymph nodes have subtle mineralization evident. Irregular Soft tissue fullness in the hilar regions is similar to prior scan. The esophagus has normal imaging features. There is no axillary lymphadenopathy.  Lungs/Pleura: The central tracheobronchial airways are patent. 7 mm left upper lobe pulmonary nodule is stable in the interval. Interval development of numerous, right greater than left, irregular bilateral pulmonary nodules. There is diffuse Perilymphatic thickening and nodularity, right greater than left, progressive in the interval with nodular thickening of the right major fissure progressive since prior study. These changes are associated with complete collapse of the right middle lobe. No pleural effusion  IMPRESSION: 1. Interval progression of bilateral perilymphatic thickening and nodularity, right greater than left. Imaging features are compatible with the patient's known history of sarcoidosis. 2. Interval development of complete collapse right middle lobe. Presumably also secondary to sarcoidosis and hilar involvement, but close follow-up recommended to exclude central obstructing lesion un related to sarcoidosis. 3.  Aortic Atherosclerois (ICD10-170.0)   Electronically Signed   By: Misty Stanley M.D.   On: 05/21/2018 09:54     OV 08/24/2018  Subjective:  Patient ID: [name], female , DOB: 02-13-1931 , age 46 y.o. , MRN: [id number] , ADDRESS: [address] [place] [facility] [address]   08/24/2018 -   Chief Complaint  Patient presents with   Follow-up    PFT performed 08/23/2018.  Pt  states she has been doing well since last visit and denies any complaints.     HPI Khamiya Lehrer Masser 85 y.o. -+ returns for follow-up of her sarcoidosis with cough and middle lobe syndrome.  She is now on 5 mg prednisone.  She increase it from 4 mg prednisone because of cough.  With this the cough is  well controlled.  The issue more recently is that in 2019 she had a CT chest that shows progression of her sarcoid compared to 2011.  However in the interim she has had CT scans at [facility] in 2012 and 2014.  Ideally we would need to make a comparison to the most recent CT scan it appears at least based on reports the CT scans at [facility] in 2014 and [place] in 2019 might be similar.  However it is challenging to get radiology discs from outside institutions.  Therefore, we decided to look for progression with a pulmonary function test today which is essentially normal except for mild reduction in DLCO.  Incidentally, we try to get her chest x-ray image from primary care physician locally and so far we have not been able to do that.  Currently she is is also asymptomatic.  She is asymptomatic on 5 mg prednisone.   Results for NALEIGH, BOOKHART (MRN [id number]) as of 08/24/2018 13:53  Ref. Range 08/23/2018 09:37  FEV1-Pre Latest Units: L 2.00  FEV1-%Pred-Pre Latest Units: % 127  Pre FEV1/FVC ratio Latest Units: % 72  Results for LILANA, HOUT (MRN [id number]) as of 08/24/2018 13:53  Ref. Range 08/23/2018 09:37  FEV1-Pre Latest Units: L 2.00  FEV1-%Pred-Pre Latest Units: % 127  Pre FEV1/FVC ratio Latest Units: % 72    OV 05/26/2019  Subjective:  Patient ID: [name], female , DOB: 1931-08-08 , age 85 y.o. , MRN: [id number] , ADDRESS: [address] [place] [facility] [address]   05/26/2019 -   Chief Complaint  Patient presents with   Follow-up    Pt states her breathing is doing well. Pt states her cough is mild. Pt states her cough is unchanged since last OV in 08/2018. Pt CP/tightness, f/c/s.    Follow-up sarcoidosis pulmonary with ocular sarcoid right middle lobe syndrome and chronic cough.  Previously on methotrexate.  Currently on prednisone 5 mg/day.  HPI ANISHIA BEAULIEU 85 y.o. -last seen January 2020.  Since then she has been social distancing.  She and her husband  are doing well.  She is currently on prednisone 5 mg/day and has very minimal cough.  RSI cough score is only 2 although she thinks maybe the cough is increased compared to last visit but the subjective score shows the reverse.  She has no other respiratory issues.  Specifically denies any wheezing or shortness of breath or hemoptysis or weight loss or night sweats or chills.  She swims and exercises.  She is up-to-date with her flu shot.  We specifically discussed about the fact that October 2019 CT scan of the chest showed progression compared to many years ago although we agree that this progression is not really progression if one were to compare 2012 and 2014 CT scans of the chest at [facility].  In other words the progression should have happened prior to 2012 or prior to 2014.  However we have been unable to get the CT scan CD-ROM from [facility].  She is having a CT angios neck coming up with Dr. Floyde Parkins of neurology/CT scan will  be done at [place].  She is interested in getting a CT scan of the chest if we can coincide the appointments.  She understands that if her sarcoidosis shows progression that she will not be interested in methotrexate or other immunomodulators.  She understands the risk of radiation.  However she is willing to undergo the CT scan to have a sense of a progression versus stability.     Dr Lorenza Cambridge Reflux Symptom Index (> 13-15 suggestive of LPR cough) Dec 2016 10/18/2015  04/22/2016  10/28/2016  01/13/2017  05/26/2019   Hoarseness of problem with voice 2 3 3 1 1  0  Clearing  Of Throat 3 2 1 1  0 1  Excess throat mucus or feeling of post nasal drip 1 1 0 1 0 0  Difficulty swallowing food, liquid or tablets 2 1 0 1 0 0  Cough after eating or lying down 3 0 0 0 0 0  Breathing difficulties or choking episodes 1 0 0 0 2 0  Troublesome or annoying cough 3 1 0 0 0 1  Sensation of something sticking in throat or lump in throat 0 1 0 0 0 0  Heartburn, chest  pain, indigestion, or stomach acid coming up 3 1 2 1 1  0  TOTAL 18 10 6 5 3 2      ROS - per HPI     has a past medical history of Arthritis, Cervical spondylosis, Chest pain (07/28/2008), Collapsed lung ( [id number]), Complication of anesthesia, Dislocation closed, shoulder (7/14), DVT (deep venous thrombosis) ([place]) (2006), GERD (gastroesophageal reflux disease), Headache, High cholesterol, Hypertension, Hypothyroidism, Peripheral vascular disease ([place]) (2006), PONV (postoperative nausea and vomiting), Sarcoidosis, and Sarcoidosis of lung ([place]).   reports that she has never smoked. She has never used smokeless tobacco.  Past Surgical History:  Procedure Laterality Date   EYE SURGERY     FOOT SURGERY Left 06/2005   bunion hammer toe   HEMORRHOID SURGERY  12/1972   INTRAOCULAR LENS INSERTION     right eye 10 /16 and left 06/16/2005   JOINT REPLACEMENT     SKIN BIOPSY  01/2007   basal cell carcinoma removed from right lower leg    TOTAL HIP ARTHROPLASTY  06/2007   left   TOTAL HIP ARTHROPLASTY Right 08/26/2016   Procedure: TOTAL HIP ARTHROPLASTY ANTERIOR APPROACH;  Surgeon: Melrose Nakayama, MD;  Location: [facility];  Service: Orthopedics;  Laterality: Right;   TOTAL KNEE ARTHROPLASTY Right 10/2007   right    Allergies  Allergen Reactions   Methotrexate Derivatives     Mild alopecia     Immunization History  Administered Date(s) Administered   DTaP 12/10/1995   Hepatitis A 12/10/1999   Influenza Split 04/11/2013, 04/11/2014   Influenza Whole 04/15/2010   Influenza, High Dose Seasonal PF 04/17/2018, 04/21/2019   Influenza,inj,Quad PF,6+ Mos 05/07/2015, 04/11/2016   Influenza-Unspecified 05/09/2017   Pneumococcal Conjugate-13 04/28/2017   Pneumococcal Polysaccharide-23 05/13/2004   Pneumococcal-Unspecified 08/11/2010, 03/11/2014   Td 02/08/2002   Tdap 08/11/2012   Zoster 06/12/2007    Family History  Problem Relation Age of Onset   Diabetes Mother     Heart failure Mother    Hypertension Father    Diabetes Father    Heart disease Father    Heart attack Father      Current Outpatient Medications:    amLODipine (NORVASC) 10 MG tablet, Take 10 mg by mouth daily., Disp: , Rfl:    azelastine (OPTIVAR) 0.05 % ophthalmic solution, Place 1  drop into both eyes 2 (two) times daily. , Disp: , Rfl:    fluticasone (FLONASE) 50 MCG/ACT nasal spray, Place 2 sprays into both nostrils daily., Disp: 48 g, Rfl: 5   gemfibrozil (LOPID) 600 MG tablet, Take 600 mg by mouth 2 (two) times daily before a meal., Disp: , Rfl:    levothyroxine (SYNTHROID, LEVOTHROID) 25 MCG tablet, Take 25 mcg by mouth daily. , Disp: , Rfl:    LORazepam (ATIVAN) 0.5 MG tablet, Take 0.5 mg by mouth every 8 (eight) hours as needed for anxiety or sleep. , Disp: , Rfl:    losartan (COZAAR) 50 MG tablet, Take 50 mg by mouth daily., Disp: , Rfl:    Multiple Vitamin (MULTIVITAMIN) capsule, Take 1 capsule by mouth daily., Disp: , Rfl:    Polyethylene Glycol 3350 (GLYCOLAX PO), Take by mouth., Disp: , Rfl:    predniSONE (DELTASONE) 5 MG tablet, Take 1 tablet (5 mg total) by mouth daily with breakfast., Disp: 90 tablet, Rfl: 1   UNABLE TO FIND, Med Name: CA 2- 600mg  qd, Disp: , Rfl:       Objective:   Vitals:   05/26/19 0950  BP: 132/64  Pulse: (!) 53  Temp: (!) 97.4 F (36.3 C)  TempSrc: Skin  SpO2: 98%  Weight: 135 lb 12.8 oz (61.6 kg)  Height: 5\' 2"  (1.575 m)    Estimated body mass index is 24.84 kg/m as calculated from the following:   Height as of this encounter: 5\' 2"  (1.575 m).   Weight as of this encounter: 135 lb 12.8 oz (61.6 kg).  @WEIGHTCHANGE @  [employer]   05/26/19 0950  Weight: 135 lb 12.8 oz (61.6 kg)     Physical Exam  General Appearance:    Alert, cooperative, no distress, appears stated age - no looks younger , Deconditioned looking - no , OBESE  - no, Sitting on Wheelchair -  no  Head:    Normocephalic, without obvious  abnormality, atraumatic  Eyes:    PERRL, conjunctiva/corneas clear,  Ears:    Normal TM's and external ear canals, both ears  Nose:   Nares normal, septum midline, mucosa normal, no drainage    or sinus tenderness. OXYGEN ON  - no . Patient is @ ra   Throat:   Lips, mucosa, and tongue normal; teeth and gums normal. Cyanosis on lips - no  Neck:   Supple, symmetrical, trachea midline, no adenopathy;    thyroid:  no enlargement/tenderness/nodules; no carotid   bruit or JVD  Back:     Symmetric, no curvature, ROM normal, no CVA tenderness  Lungs:     Distress - no , Wheeze no, Barrell Chest - no, Purse lip breathing - no, Crackles - no   Chest Wall:    No tenderness or deformity.    Heart:    Regular rate and rhythm, S1 and S2 normal, no rub   or gallop, Murmur - no  Breast Exam:    NOT DONE  Abdomen:     Soft, non-tender, bowel sounds active all four quadrants,    no masses, no organomegaly. Visceral obesity - no  Genitalia:   NOT DONE  Rectal:   NOT DONE  Extremities:   Extremities - normal, Has Cane - no, Clubbing - no, Edema - no  Pulses:   2+ and symmetric all extremities  Skin:   Stigmata of Connective Tissue Disease - no  Lymph nodes:   Cervical, supraclavicular, and axillary nodes normal  Psychiatric:  Neurologic:   Pleasant - yes, Anxious - n, Flat affect - no  CAm-ICU - neg, Alert and Oriented x 3 - yes, Moves all 4s - yes, Speech - normal, Cognition - intact             Assessment:       ICD-10-CM   1. 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 Chronic cough  R05        Plan:     Patient Instructions     ICD-10-CM   1. 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 Chronic cough  R05      Clinically well on low dose steroid but we do need to know more  about progression seen on CT Oct 2019   Understood that we have not been able to get the CT chest CD ROM 2014 and 2014 from [place] to make sense of the progression of  your sarcoid in the face of being well overall  Plan - continue prednisone 5mg  per day - Daneil Dan will do refill   - do CT chest without contrast - Daneil Dan will ensure that we can fit it in same time as your CT neck at [facility] imaging  Followup Will call with results of CT 6-9 months or sooner in regular clinic     SIGNATURE    Dr. Brand Males, M.D., F.C.C.P,  Pulmonary and Critical Care Medicine Staff Physician, [place] Director - Interstitial Lung Disease  Program  Pulmonary [place] at [place], [place], [address]  Pager: [phone], If no answer or between  15:00h - 7:00h: call [phone] Telephone: [phone]  10:16 AM 05/26/2019

## 2019-05-26 NOTE — Addendum Note (Signed)
Addended by: Collier Salina on: 05/26/2019 10:27 AM   Modules accepted: Orders

## 2019-05-26 NOTE — Patient Instructions (Signed)
ICD-10-CM   1. 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 Chronic cough  R05      Clinically well on low dose steroid but we do need to know more  about progression seen on CT Oct 2019   Understood that we have not been able to get the CT chest CD ROM 2014 and 2014 from [place] to make sense of the progression of your sarcoid in the face of being well overall  Plan - continue prednisone 5mg  per day - Daneil Dan will do refill   - do CT chest without contrast - Daneil Dan will ensure that we can fit it in same time as your CT neck at [facility] imaging  Followup Will call with results of CT 6-9 months or sooner in regular clinic

## 2019-05-26 NOTE — Addendum Note (Signed)
Addended by: Collier Salina on: 05/26/2019 12:45 PM   Modules accepted: Orders

## 2019-06-01 ENCOUNTER — Ambulatory Visit
Admission: RE | Admit: 2019-06-01 | Discharge: 2019-06-01 | Disposition: A | Discharge status: Home / Self Care | Payer: Medicare Other | Source: Ambulatory Visit | Attending: Neurology | Admitting: Neurology

## 2019-06-01 ENCOUNTER — Telehealth: Payer: Self-pay | Admitting: Internal Medicine

## 2019-06-01 ENCOUNTER — Ambulatory Visit
Admission: RE | Admit: 2019-06-01 | Discharge: 2019-06-01 | Disposition: A | Discharge status: Home / Self Care | Payer: Medicare Other | Source: Ambulatory Visit | Attending: Internal Medicine | Admitting: Internal Medicine

## 2019-06-01 ENCOUNTER — Telehealth: Payer: Self-pay | Admitting: Neurology

## 2019-06-01 DIAGNOSIS — G4489 Other headache syndrome: Secondary | ICD-10-CM

## 2019-06-01 DIAGNOSIS — D869 Sarcoidosis, unspecified: Secondary | ICD-10-CM

## 2019-06-01 DIAGNOSIS — I679 Cerebrovascular disease, unspecified: Secondary | ICD-10-CM

## 2019-06-01 MED ORDER — IOPAMIDOL (ISOVUE-370) INJECTION 76%
75.0000 mL | Freq: Once | INTRAVENOUS | Status: AC | PRN
  Administered 2019-06-01: 15:00:00 75 mL via INTRAVENOUS

## 2019-06-01 NOTE — Telephone Encounter (Signed)
Please let Shallen L Stallone kow that CT chest compared to last year shows stability to slight overall improvement in sarcoid  Plan  - no change in plan from prior ov     SIGNATURE    Dr. Brand Males, M.D., F.C.C.P,  Pulmonary and Critical Care Medicine Staff Physician, [place] Director - Interstitial Lung Disease  Program  Pulmonary [place] at [place], [place], [address]  Pager: [phone], If no answer or between  15:00h - 7:00h: call [phone] Telephone: [phone]  6:15 PM 06/01/2019

## 2019-06-01 NOTE — Telephone Encounter (Signed)
I called the patient.  The CT angiogram of the head neck is unremarkable, the patient does have some atherosclerosis of the aorta.  The patient has fairly extensive small vessel ischemic changes on MRI of the brain.  She is no longer having headaches currently.  I recommended being on low-dose aspirin or Plavix, the patient was told apparently not to go on aspirin for some reason, she will check with her primary care physician regarding this.  If she cannot take aspirin, possibly Plavix will be an option.    CTA head and neck 06/01/19:  IMPRESSION: 1. Aortic atherosclerosis. No aneurysm or dissection. 2. No carotid bifurcation stenosis on either side. Minimal calcified plaque at the left carotid bifurcation. 3. No intracranial large or medium vessel occlusion or correctable proximal stenosis. Atherosclerotic calcification in both vertebral V4 segments with stenosis estimated at 50% on the right and 30% on the left. 4. 8 mm well-circumscribed nodule in the left upper lobe as discussed on the chest CT today.

## 2019-06-09 NOTE — Telephone Encounter (Signed)
Called and spoke to patient. Relayed results per Dr. Chase Caller. Patient verbalized understanding. Nothing further needed at this time.

## 2019-09-20 ENCOUNTER — Other Ambulatory Visit: Payer: Self-pay

## 2019-09-20 ENCOUNTER — Ambulatory Visit: Payer: Medicare PPO | Admitting: Cardiovascular Disease

## 2019-09-20 ENCOUNTER — Encounter: Payer: Self-pay | Admitting: Cardiovascular Disease

## 2019-09-20 VITALS — BP 162/58 | HR 63 | Ht 62.0 in | Wt 137.2 lb

## 2019-09-20 DIAGNOSIS — R55 Syncope and collapse: Secondary | ICD-10-CM | POA: Insufficient documentation

## 2019-09-20 NOTE — Progress Notes (Signed)
Cardiology Office Note:    Date:  09/20/2019   ID:  [name], DOB [birth year], MRN [id number]  PCP:  Burnard Bunting, MD  Cardiologist:  Cameo Schmiesing  Electrophysiologist:  None   Referring MD: Burnard Bunting, MD   Chief Complaint  Patient presents with  . Loss of Consciousness     History of Present Illness:    [name] Page is a 86 y.o. female with a hx of RBBB .   She had a recent episode of syncope and we were asked to see her again by Dr. Reynaldo Minium to see [place] for this syncope .    I have seen in her in the past.    We did a stress test and everything was ok Is very healthy, she exercises about 6 days a week. Swims 3 days a week.  Does the elliptical and treadmill the other day  No CP or dyspnea  No syncope or presyncope before or after exercising .  This episode of syncope occurred after she had a laceration on her leg.   She cut her leg on the car door. [place] ran [place] .   The site of blood caused her to faint .   Woke up with EMS all around her  No other episodes of syncope or presyncope since that time .  Hx of HTN, hypothyroidism  BP is typcially OK   Is a retired Network engineer at [employer].   Past Medical History:  Diagnosis Date  . Alopecia 10/28/2016  . Arthritis   . Cervical spondylosis    C3-4 AND C4-5  . Chest pain 07/28/2008   H/O, normal stress nuclear EF 78%  . Chronic cough 07/12/2014  . Collapsed lung  12/11   being treated at [facility] - in left lung  . Complication of anesthesia   . Cough 07/30/2010   Qualifier: Diagnosis of  By: Chase Caller MD, Murali    . Cystocele 05/23/2015  . Cystocele, midline 05/02/2013  . DEEP VEIN THROMBOSIS/PHLEBITIS 07/30/2010   Qualifier: History of  By: Harvest Dark CMA, Anderson Malta    . Dislocation closed, shoulder 7/14  . DVT (deep venous thrombosis) ([place]) 2006  . Encounter for therapeutic drug monitoring 10/03/2014  . GERD (gastroesophageal reflux disease)    occ  . Headache    hx  . High cholesterol    . History of recurrent UTIs 09/24/2011  . HYPERLIPIDEMIA 07/30/2010   Qualifier: Diagnosis of  By: Harvest Dark CMA, Anderson Malta    . Hypertension   . HYPERTENSION 07/30/2010   Qualifier: Diagnosis of  By: Harvest Dark CMA, Anderson Malta    . Hypothyroidism   . Nasal itching 01/30/2014  . Osteopenia 05/23/2015  . Peripheral vascular disease ([place]) 2006   dvt's and 50 yrs ago  . Pleuritic pain 12/17/2015  . PONV (postoperative nausea and vomiting)   . Primary localized osteoarthritis of right hip 08/26/2016  . Primary osteoarthritis of right hip 08/26/2016  . Pulmonary collapse 07/30/2010   Qualifier: Diagnosis of  By: Chase Caller MD, Murali    . PULMONARY NODULE 07/30/2010   Qualifier: Diagnosis of  By: Chase Caller MD, Murali    . Rash and nonspecific skin eruption 01/02/2015  . 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 Sarcoidosis   . Sarcoidosis of lung ([place])   . Vaginal atrophy 09/24/2011  . Visual field defect nasal step 01/02/2015    Past Surgical History:  Procedure Laterality Date  . EYE SURGERY    . FOOT SURGERY Left 06/2005  bunion hammer toe  . HEMORRHOID SURGERY  12/1972  . INTRAOCULAR LENS INSERTION     right eye 10 /16 and left 06/16/2005  . JOINT REPLACEMENT    . SKIN BIOPSY  01/2007   basal cell carcinoma removed from right lower leg   . TOTAL HIP ARTHROPLASTY  06/2007   left  . TOTAL HIP ARTHROPLASTY Right 08/26/2016   Procedure: TOTAL HIP ARTHROPLASTY ANTERIOR APPROACH;  Surgeon: Melrose Nakayama, MD;  Location: [place];  Service: Orthopedics;  Laterality: Right;  . TOTAL KNEE ARTHROPLASTY Right 10/2007   right    Current Medications: Current Meds  Medication Sig  . amLODipine (NORVASC) 10 MG tablet Take 10 mg by mouth daily.  Marland Kitchen ascorbic acid (VITAMIN C) 1000 MG tablet Take 500 mg by mouth daily.  Marland Kitchen azelastine (OPTIVAR) 0.05 % ophthalmic solution Place 1 drop into both eyes 2 (two) times daily.   . fluticasone (FLONASE) 50 MCG/ACT nasal spray Place 2 sprays into both nostrils daily.   Marland Kitchen gemfibrozil (LOPID) 600 MG tablet Take 600 mg by mouth 2 (two) times daily before a meal.  . levothyroxine (SYNTHROID, LEVOTHROID) 25 MCG tablet Take 25 mcg by mouth daily.   Marland Kitchen LORazepam (ATIVAN) 0.5 MG tablet Take 0.5 mg by mouth every 8 (eight) hours as needed for anxiety or sleep.   Marland Kitchen LORazepam (ATIVAN) 1 MG tablet Take 0.5 mg by mouth every 8 (eight) hours as needed. As needed  . losartan (COZAAR) 50 MG tablet Take 50 mg by mouth daily.  . Multiple Vitamin (MULTIVITAMIN) capsule Take 1 capsule by mouth daily.  . nitroGLYCERIN (NITROSTAT) 0.4 MG SL tablet Place 0.4 mg under the tongue as needed.  . Polyethylene Glycol 3350 (GLYCOLAX PO) Take by mouth.  . polyethylene glycol powder (GLYCOLAX/MIRALAX) 17 GM/SCOOP powder Take 17 g by mouth daily.  . predniSONE (DELTASONE) 5 MG tablet Take 1 tablet (5 mg total) by mouth daily with breakfast.  . UNABLE TO FIND Med Name: CA 2- 600mg  qd     Allergies:   Methotrexate derivatives   Social History   Socioeconomic History  . Marital status: Married    Spouse name: Not on file  . Number of children: Not on file  . Years of education: Not on file  . Highest education level: Not on file  Occupational History  . Occupation: retired    Fish farm manager: RETIRED  Tobacco Use  . Smoking status: Never Smoker  . Smokeless tobacco: Never Used  Substance and Sexual Activity  . Alcohol use: Yes    Comment: BEER/glass of wine A DAY  . Drug use: No  . Sexual activity: Never  Other Topics Concern  . Not on file  Social History Narrative  . Not on file   Social Determinants of Health   Financial Resource Strain:   . Difficulty of Paying Living Expenses: Not on file  Food Insecurity:   . Worried About Charity fundraiser in the Last Year: Not on file  . Ran Out of Food in the Last Year: Not on file  Transportation Needs:   . Lack of Transportation (Medical): Not on file  . Lack of Transportation (Non-Medical): Not on file  Physical Activity:   .  Days of Exercise per Week: Not on file  . Minutes of Exercise per Session: Not on file  Stress:   . Feeling of Stress : Not on file  Social Connections:   . Frequency of Communication with Friends and Family: Not on file  .  Frequency of Social Gatherings with Friends and Family: Not on file  . Attends Religious Services: Not on file  . Active Member of Clubs or Organizations: Not on file  . Attends Archivist Meetings: Not on file  . Marital Status: Not on file     Family History: The patient's family history includes Diabetes in her father and mother; Heart attack in her father; Heart disease in her father; Heart failure in her mother; Hypertension in her father.  ROS:   Please see the history of present illness.     All other systems reviewed and are negative.  EKGs/Labs/Other Studies Reviewed:    The following studies were reviewed today:   EKG:   September 20, 2019: Normal sinus rhythm.  Right bundle branch block with associated ST and T wave changes.  Recent Labs: 05/02/2019: ALT 10; BUN 16; Creatinine, Ser 0.73; Potassium 5.7; Sodium 142  Recent Lipid Panel No results found for: CHOL, TRIG, HDL, CHOLHDL, VLDL, LDLCALC, LDLDIRECT  Physical Exam:    VS:  BP (!) 162/58   Pulse 63   Ht 5\' 2"  (1.575 m)   Wt 137 lb 3.2 oz (62.2 kg)   SpO2 98%   BMI 25.09 kg/m     Wt Readings from Last 3 Encounters:  09/20/19 137 lb 3.2 oz (62.2 kg)  05/26/19 135 lb 12.8 oz (61.6 kg)  05/02/19 138 lb (62.6 kg)     GEN:  Well nourished, well developed in no acute distress HEENT: Normal NECK: No JVD; No carotid bruits LYMPHATICS: No lymphadenopathy CARDIAC: RRR, no murmurs, rubs, gallops RESPIRATORY:  Clear to auscultation without rales, wheezing or rhonchi  ABDOMEN: Soft, non-tender, non-distended MUSCULOSKELETAL:  No edema; No deformity  SKIN: Warm and dry NEUROLOGIC:  Alert and oriented x 3 PSYCHIATRIC:  Normal affect   ASSESSMENT:    1. Vasovagal syncope     PLAN:    In order of problems listed above:  1.  Vasovagal syncope: The patient presents today for further evaluation of an episode of syncope.  The syncope occurred with the side of blood.  She is extremely healthy for 86 years old and exercises 6 days a week.  She routinely works out on the [employer] all and also swims in the pool.  She is never had any episodes of syncope or presyncope during or after exercise.  Her EKG is stable showing normal sinus rhythm with a right bundle branch block.  Her exam is normal.  She does not have any significant murmurs.  At this point I am not inclined to pursue any additional work-up.   I've encourage her to call us back if she has any additional problems.  We will see her on an as needed basis    Medication Adjustments/Labs and Tests Ordered: Current medicines are reviewed at length with the patient today.  Concerns regarding medicines are outlined above.  Orders Placed This Encounter  Procedures  . EKG 12-Lead   No orders of the defined types were placed in this encounter.   Patient Instructions  Medication Instructions:  Your physician recommends that you continue on your current medications as directed. Please refer to the Current Medication list given to you today.  *If you need a refill on your cardiac medications before your next appointment, please call your pharmacy*  Lab Work: None ordered  If you have labs (blood work) drawn today and your tests are completely normal, you will receive your results only by: Marland Kitchen MyChart Message (  if you have MyChart) OR . A paper copy in the mail If you have any lab test that is abnormal or we need to change your treatment, we will call you to review the results.  Testing/Procedures: None ordered  Follow-Up: At [facility], you and your health needs are our priority.  As part of our continuing mission to provide you with exceptional heart care, we have created designated Provider Care  Teams.  These Care Teams include your primary Cardiologist (physician) and Advanced Practice Providers (APPs -  Physician Assistants and Nurse Practitioners) who all work together to provide you with the care you need, when you need it.  Your next appointment:   AS NEEDED   Provider:   You may see Dr. Acie Fredrickson or one of the following Advanced Practice Providers on your designated Care Team:    Richardson Dopp, PA-C  [place], [place]  Daune Perch, [place]       Signed, Mertie Moores, MD  09/20/2019 3:43 PM    [place]

## 2019-09-20 NOTE — Patient Instructions (Signed)
Medication Instructions:  Your physician recommends that you continue on your current medications as directed. Please refer to the Current Medication list given to you today.  *If you need a refill on your cardiac medications before your next appointment, please call your pharmacy*  Lab Work: None ordered  If you have labs (blood work) drawn today and your tests are completely normal, you will receive your results only by: Marland Kitchen MyChart Message (if you have MyChart) OR . A paper copy in the mail If you have any lab test that is abnormal or we need to change your treatment, we will call you to review the results.  Testing/Procedures: None ordered  Follow-Up: At [facility], you and your health needs are our priority.  As part of our continuing mission to provide you with exceptional heart care, we have created designated Provider Care Teams.  These Care Teams include your primary Cardiologist (physician) and Advanced Practice Providers (APPs -  Physician Assistants and Nurse Practitioners) who all work together to provide you with the care you need, when you need it.  Your next appointment:   AS NEEDED   Provider:   You may see Dr. Acie Fredrickson or one of the following Advanced Practice Providers on your designated Care Team:    Richardson Dopp, PA-C  [place], [place]  Daune Perch, [place]

## 2019-09-22 DIAGNOSIS — I700 Atherosclerosis of aorta: Secondary | ICD-10-CM | POA: Insufficient documentation

## 2019-11-28 ENCOUNTER — Other Ambulatory Visit: Payer: Self-pay | Admitting: General Practice

## 2019-11-28 DIAGNOSIS — R52 Pain, unspecified: Secondary | ICD-10-CM

## 2019-12-08 ENCOUNTER — Other Ambulatory Visit: Payer: Self-pay

## 2019-12-08 ENCOUNTER — Ambulatory Visit: Payer: Medicare PPO | Admitting: Internal Medicine

## 2019-12-08 ENCOUNTER — Encounter: Payer: Self-pay | Admitting: Internal Medicine

## 2019-12-08 VITALS — BP 144/68 | HR 86 | Temp 97.0°F | Ht 62.0 in | Wt 135.8 lb

## 2019-12-08 DIAGNOSIS — D869 Sarcoidosis, unspecified: Secondary | ICD-10-CM

## 2019-12-08 DIAGNOSIS — R053 Chronic cough: Secondary | ICD-10-CM

## 2019-12-08 DIAGNOSIS — R05 Cough: Secondary | ICD-10-CM | POA: Diagnosis not present

## 2019-12-08 DIAGNOSIS — J9819 Other pulmonary collapse: Secondary | ICD-10-CM | POA: Diagnosis not present

## 2019-12-08 MED ORDER — PREDNISONE 5 MG PO TABS: 5.0000 mg | ORAL_TABLET | Freq: Every day | ORAL | 3 refills | Status: DC

## 2019-12-08 NOTE — Addendum Note (Signed)
Addended by: Gavin Potters R on: 12/08/2019 11:48 AM   Modules accepted: Orders

## 2019-12-08 NOTE — Patient Instructions (Signed)
ICD-10-CM   1. 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 Chronic cough  R05     Minimal to no symptoms and well-controlled on prednisone 5 mg/day and nasal steroid CT scan last year showed stability/improvement  Plan -shared decision making -Continue prednisone 5 mg/day -take 30-day supply or 90-day supply as you see fit with refills for a year -Continue generic fluticasone nasal spray 2 squirts daily  Follow-up -9 months for clinical follow-up; sooner if needed

## 2019-12-08 NOTE — Progress Notes (Signed)
OV 08/24/2014  Chief Complaint  Patient presents with  . Follow-up    Pt stated the gabapentin has not changed her breathing or cough. Pt c/o prod cough with white mucus. Pt denies SOB and CP/tightness.    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 Main symptom is severe chronic cough with high RSI score suggestive of irritable larynx syndrome that is associated as well  She always finds prednisone to help the cough but due to age and side effects despite good functional status she opts to take a low-dose of prednisone. Currently she is on 5 mg prednisone. In November 2015 due to RSI score of 27 for cough but decided to trial gabapentin to tackle the neurogenic component of cough. She reports now that the gabapentin has not helped her at all although objectively the RSI cough score has drop to 20. She is very frustrated. She and her husband are reluctant to have right middle lobectomy given her advanced age despite good functional status. There are no open to trying methotrexate to tackle the inflammation of sarcoidosis and as a steroid sparing agent. She also wants to come off gabapentin   OV 10/02/2014  Chief Complaint  Patient presents with  . Follow-up    Pt states her breathing is doing well. Pt states her SOB has improved since last OV. Pt c/o mild cough. Pt denies CP/tightness. Pt stated being on methotrexate has significantly helped.     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 Main symptom is severe chronic cough with high RSI score suggestive of irritable larynx syndrome that is associated as well    Last visit in January 2016 she told me that gabapentin did not help her cough. So we stopped this. We started methotrexate for inflammation in her airway due to  sarcoid causing right middle lobe collapse. We left her on prednisone 5 mg per day and started her on  methotrexate 5 mg once a week. Also gave Hycodan cough syrup. She says that the Hycodan cough syrup did not help However, 2 weeks after starting her methotrexate she started noticing dramatic improvement in her cough. She hardly coughs now. She has significant improvement in her quality of life and she is extremely happy.  RSI cough score had dropped from 25 to 20 with gabapentin has now dropped to 5.  She is asking questions about how long to take methotrexate. And also review of the side effect profile. Of note I did notice that she is on ACE inhibitor that can potentially provoke all make her cough worse. She is not taking gabapentin anymore. There is no fever or chills. At this point in time she's not interested in Bactrim PCP prophylaxis which should be okay given low-dose of methotrexate and prednisone  Past, Family, Social reviewed: no change since last visit    OV 01/02/2015  Chief Complaint  Patient presents with  . Follow-up    Pt recently went to opthamologist and pts vision has decreased since last OV. Pt stated her breathing is unchanged since last OV. Pt c/o mild dry cough. Pt denies CP/tightness.     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 Main symptom is severe chronic cough with high RSI score suggestive of irritable larynx syndrome that is associated as well  Cough /Saarcoid / IRritiable LArynx/ RML syundrome - cough persists but is only mild and baseline.  No problems. Does not feel it impacts qualkity of life. She is off ace inhibitor now  ; forgot to mention this during med reconciliation. No asociated wheeze or dyspnea. Maintained on mehtotrxate 5mg  weekly, folic acid and prednisone 5mg  daily. No evidence of infection. Last LFT was 3 months ago and normal. Hycodan helping cough and wants refill  NEw issue - her eye doc Dr Ellie Lunch called yesterday. PAtient has no complaints of vision but post capsulootmy few week ago and now  yesterday visual field deficit on Rt side is worse. Concern is ocular sarcoid is worse. They are referring her to [facility]. PAtient does not want step up Rx of sarcoid till all this is sorted out  New issue - noticed some bruising in left forearm yesterday sponatenous x 2 spots. New. Asytmptomatcic and last 1 weeks some punctate red lesion in distal 1/3 of leg. Associatd with gardening but denies poison ivy. Unchanged since insidious onset. No associatd fever.    OV 04/09/2015  Chief Complaint  Patient presents with  . Follow-up    Pt denies cough, SOB, CP/tightness. Pt states her breathing is doing well. Pt states she had a recent rash on BIL lower extremities and saw derm and was given a topical ointment that helped. Per pt the derm thought the rash was not related to sarcoid.     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 Main symptom is severe chronic cough with high RSI score suggestive of irritable larynx syndrome that is associated as well   Cough /Saarcoid / IRritiable LArynx/ RML syundrome - cough is significantly improved. RSI cough score is only 14. She feels that the cough is at a stage where it is completely fine and she can handle things. She is maintained on methotrexate 5 mg weekly, folic acid and prednisone 5 mg daily. She did have some lowish white count after last visit and anemia but we held to the methotrexate and it seems to be stable. She also had a rash at the time of last visit she has seen dermatology and apparently the biopsies do not show this is due to sarcoid. I personally review this outside chart and confirmed that. Of note her last chest x-ray was November 2015 which showed continued right middle lobe collapse. The cough is controlled with the methotrexate and prednisone. She is no longer on ACE inhibitor even though our MAR reports that Also noted that she reports some mild weight gain with prednisone but she says she  will try dietary measures to shake this weight off. She does not want to cough to come and therefore she is content continue with the methotrexate and prednisone. In terms of her vision she is waiting for [facility] referral to [facility]mology in October 2016.    OV 07/16/2015  Chief Complaint  Patient presents with  . Follow-up    Pt states her breathing is unchanged but her cough has worsened. Pt states her SOB is at her baseline. Pt states her cough is nonproductive and worse in the evening. Pt denies CP/tightness.     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 Main symptom is severe chronic cough with high RSI score suggestive of irritable larynx syndrome that is associated as well   Cough /Saarcoid / IRritiable LArynx/ RML syundrome -she now reports that her cough is worse for the last few months especially since last visit. RSI cough score is 18 and subjectively  she feels that cough is worse by 25%. Otherwise she is okay. Cough is associated with worsening postnasal drip. She is also asking for refills on a cough syrup methotrexate folic acid. She prefers to have 90 day refills. Despite cough worsened she is not too keen on gabapentin therapy and wants to try other simpler measures at this point in time. There is no fever or sputum production that is associated with the cough.  In terms of her ophthalmology issues she has been reassured apparently it is a left optic now that is problematic in this 50% gone. No active follow-up.   OV 10/18/2015   Chief Complaint  Patient presents with  . Follow-up    Pt states her cough has significantly improved. Pt denies SOB and CP/tightness. Pt states overall she feels she is doing well.     Follow-up chronic cough associated with right middle lobe syndrome secondary to sarcoidosis and irritable larynx syndrome  Last visit December 2016. At the time cough that deteriorated. The significant  amount of postnasal drip. We instituted nasal steroids and with this cough is significantly improved and back to baseline. RSI cough score is 10 which is around her baseline. She feels immensely better. She is in a good phase of her health. There are no new issues. She is pending $24 and over-the-counter nasal steroid and we discussed about doing a generic prescription which might be cheaper. She wants refill postdated on her cough syrup. She wants 90 day refills on her methotrexate. She is happy with having a next follow-up in 6 months.   OV 04/22/2016  Chief Complaint  Patient presents with  . Follow-up    Pt states her breathing is at baseline. Pt states she is doing well. Pt states has a rare cough, not needing cough syrup any longer. Pt denies CP/tightness and f/c/s.      follow-up chronic cough associated with right middle lobe syndrome secondary to sarcoidosis [also ocular sarcoid] and irritable larynx syndrome  Last visit was me was in March 2017. In the interim she has visited Designer, jewellery. She is on prednisone 5 mg per day and also methotrexate 5 mg once a week.Her cough is significantly improved. She had a chest x-ray May 2017 that I personally visualized shows significant improvement in her right middle lobe laps compared to 2011/2012. She's not on the needing cough syrup. She has right hip DJD now and apparently hip surgery might be contemplated. She is using nasal ster  oid. She does not do Bactrim prophylaxis. RSI cough score show significant improvement with score of 6 which is only mild cough.   OV 10/28/2016  Chief Complaint  Patient presents with  . Follow-up    sarcoidosis, wants to discuss hair loss    Fu Chronic cough due to right middle lobe syndrome due to sarcoidosis with associated with ocular sarcoidosis. On methotrexate 5 mg once weekly associated with prednisone low dose 4 mg/5 mg   She continues to do well. Cough is improved 1 RSI cough score 5 is  documented below. In January 2008 and she had replacement and she had a clear chest x-ray that I personally visualized. The cough is only mild. She has a new problem in the last 6 months of mild alopecia that is persistent and stable. Not associated with any pain or radiation or aggravating or relieving factors. It is associated with methotrexate intake. She wants to know about taking Biotene supplements that was advised by the hairdresser. Husband is  wondering if methotrexate is the etiology for this. Methotrexate can cause less than 10% alopecia not otherwise specified.   OV 01/13/2017  Chief Complaint  Patient presents with  . Follow-up    Pt states her breathing is baseline. Pt states she has weaned off the methotrexate. Pt states she has mild dry cough. Pt denies CP/tightness.     86 year old female follow-up sarcoidosis associated with severe chronic cough right middle lobe syndrome and associated ocular sarcoidosis. Previously on methotrexate but low-dose prednisone currently.  Last visit in March 2018 because of good control cough and new onset of alopecia has mild he stopped the methotrexate. After this she is here for follow-up. She says that her cough has not gotten worse in the absence of methotrexate. Of note methotrexate was the only agent to improve her cough a few years ago. Her alopecia is improving after stopping the methotrexate. She strongly believes the methotrexate as the cause of alopecia and is agreeable for methotrexate to be listed in her allergies. She is continuing 5 mg of redness on her day. She plans to continue this because of ocular sarcoidosis as well. Her weight is causing some easy bruising which she is accepting. Today RSI cough score is only 3 and shows good control of cough    OV 11/10/2017  Chief Complaint  Patient presents with  . Follow-up    Pt states she has been doing well since last visit and denies any complaints or concerns.     86 year old female  follow-up sarcoidosis associated with severe chronic cough right middle lobe syndrome and associated ocular sarcoidosis. Previously on methotrexate as of 2018 but low-dose prednisone currently through 2019. LAsT CXR Jan 2018 - clear   Follow-up chronic cough related to the above. She is now off methotrexate for over a year. Her alopecia has resolved. Her hair is back. She feels happy. She is on 5 mg prednisone a day. She feels well. Cough is minimal to nonexistent. She has ophthalmology appointment pending. I thought the current prednisone is because of her ocular sarcoid but she thinks it might be because of pulmonary and ocular sarcoid. She is willing to taper her prednisone down to 4 mg per day. Her husband is here with her as always. She is up-to-date with flu shot.She feels well without any respiratory symptoms of fatigue. No new medical problems or ER visits   OV 05/25/2018  Subjective:  Patient ID: [name], female , DOB: Sep 03, 1930 , age 31 y.o. , MRN: [id number] , ADDRESS: [address] [place] [facility] [address]   05/25/2018 -   Chief Complaint  Patient presents with  . Follow-up    Doing well at this time.     HPI KHALEYA AWADALLA 86 y.o. -     OV 05/25/2018  Chief Complaint  Patient presents with  . Follow-up    Doing well at this time.     86 year old female follow-up sarcoidosis (bx proven LUL dec 2014 at [facility]) associated with severe chronic cough right middle lobe syndrome and associated ocular sarcoidosis. Previously on methotrexate as of 2018 but low-dose prednisone currently through 2019. LAsT CXR Jan 2018 - clear   Last seen in April 2019.  At that time she was doing asymptomatic.  She currently is asymptomatic but this is a quicker than expected follow-up.  This is because she went to her primary care physician and she talked about her right middle lobe syndrome.  Therefore a chest x-ray was done and this  is not normal.  Therefore a CT was recommended.  She  then called my office.  We decided to get a CT chest and see her today.  She and her husband insist that she is still asymptomatic.  She is doing well on 4 mg prednisone.  She is exercising regularly.  No cough, no wheeze, no hemoptysis no sputum.  However CT scan of the chest October 2019 when compared to 2011 is significantly worse with worsening infiltrates and right middle lobes collapse and also beginning stages of left upper lobe collapse.  Most of her decline seem to happen in the early years of the diagnosis of sarcoidosis between 2011 and 2015.  In 2014 she did have a CT scan of the chest at [facility]  I do not have the images to compare but it appears that most of the report from 2014 and the current 2019 report might just be the same or perhaps the infiltrates are worse.  It is unclear.  The best comparison might be chest x-ray to chest x-ray because the most recent chest x-ray was 2018.  Also pulmonary function testing is been a while since we did one.    CT dec 2014 at [place]  - unable to see image - local comparison [facility] 2012, 2014 CT chest 05/21/2018 [facility] - [facility] 2011[facility]  There is occlusion of the right middle lobe bronchus with atelectasis of the majority of the right middle lobe. There is also occlusion of the left upper lobe bronchus proximally with associated soft tissue, increased from prior. There is diffuse mosaic attenuation which likely represents air trapping.  Impression:  1. New 5 mm left upper lobe pulmonary nodule. 6-12 month followup is recommended. Other pulmonary nodules are stable.  2. Persistent occlusion of the right middle lobe bronchus proximally with near complete right middle lobe atelectasis. There is occlusion of the left upper lobe bronchus which is slightly increased from the prior study with adjacent soft tissue. Attention on followup is recommended.  Electronically Signed [id number] Milinda Pointer, MD Electronically Signed on:07/19/2013  1:29 PM Mediastinum/Nodes: No mediastinal lymphadenopathy. Small mediastinal lymph nodes have subtle mineralization evident. Irregular Soft tissue fullness in the hilar regions is similar to prior scan. The esophagus has normal imaging features. There is no axillary lymphadenopathy.  Lungs/Pleura: The central tracheobronchial airways are patent. 7 mm left upper lobe pulmonary nodule is stable in the interval. Interval development of numerous, right greater than left, irregular bilateral pulmonary nodules. There is diffuse Perilymphatic thickening and nodularity, right greater than left, progressive in the interval with nodular thickening of the right major fissure progressive since prior study. These changes are associated with complete collapse of the right middle lobe. No pleural effusion  IMPRESSION: 1. Interval progression of bilateral perilymphatic thickening and nodularity, right greater than left. Imaging features are compatible with the patient's known history of sarcoidosis. 2. Interval development of complete collapse right middle lobe. Presumably also secondary to sarcoidosis and hilar involvement, but close follow-up recommended to exclude central obstructing lesion un related to sarcoidosis. 3.  Aortic Atherosclerois (ICD10-170.0)   Electronically Signed   By: Misty Stanley M.D.   On: 05/21/2018 09:54     OV 08/24/2018  Subjective:  Patient ID: [name], female , DOB: 03/26/1931 , age 53 y.o. , MRN: [id number] , ADDRESS: [address] [place] [facility] [address]   08/24/2018 -   Chief Complaint  Patient presents with  . Follow-up    PFT performed 08/23/2018.  Pt  states she has been doing well since last visit and denies any complaints.     HPI Mamye Pech Kalmbach 86 y.o. -+ returns for follow-up of her sarcoidosis with cough and middle lobe syndrome.  She is now on 5 mg prednisone.  She increase it from 4 mg prednisone because of cough.  With this the cough is  well controlled.  The issue more recently is that in 2019 she had a CT chest that shows progression of her sarcoid compared to 2011.  However in the interim she has had CT scans at [facility] in 2012 and 2014.  Ideally we would need to make a comparison to the most recent CT scan it appears at least based on reports the CT scans at [facility] in 2014 and [place] in 2019 might be similar.  However it is challenging to get radiology discs from outside institutions.  Therefore, we decided to look for progression with a pulmonary function test today which is essentially normal except for mild reduction in DLCO.  Incidentally, we try to get her chest x-ray image from primary care physician locally and so far we have not been able to do that.  Currently she is is also asymptomatic.  She is asymptomatic on 5 mg prednisone.   Results for LUCIANA, ILLES (MRN [id number]) as of 08/24/2018 13:53  Ref. Range 08/23/2018 09:37  FEV1-Pre Latest Units: L 2.00  FEV1-%Pred-Pre Latest Units: % 127  Pre FEV1/FVC ratio Latest Units: % 72  Results for JAELINE, SWARTOUT (MRN [id number]) as of 08/24/2018 13:53  Ref. Range 08/23/2018 09:37  FEV1-Pre Latest Units: L 2.00  FEV1-%Pred-Pre Latest Units: % 127  Pre FEV1/FVC ratio Latest Units: % 72    OV 05/26/2019  Subjective:  Patient ID: [name], female , DOB: Dec 11, 1930 , age 33 y.o. , MRN: [id number] , ADDRESS: [address] [place] [facility] [address]   05/26/2019 -   Chief Complaint  Patient presents with  . Follow-up    Pt states her breathing is doing well. Pt states her cough is mild. Pt states her cough is unchanged since last OV in 08/2018. Pt CP/tightness, f/c/s.    Follow-up sarcoidosis pulmonary with ocular sarcoid right middle lobe syndrome and chronic cough.  Previously on methotrexate.  Currently on prednisone 5 mg/day.  HPI JORIE BETZ 86 y.o. -last seen January 2020.  Since then she has been social distancing.  She and her husband  are doing well.  She is currently on prednisone 5 mg/day and has very minimal cough.  RSI cough score is only 2 although she thinks maybe the cough is increased compared to last visit but the subjective score shows the reverse.  She has no other respiratory issues.  Specifically denies any wheezing or shortness of breath or hemoptysis or weight loss or night sweats or chills.  She swims and exercises.  She is up-to-date with her flu shot.  We specifically discussed about the fact that October 2019 CT scan of the chest showed progression compared to many years ago although we agree that this progression is not really progression if one were to compare 2012 and 2014 CT scans of the chest at [facility].  In other words the progression should have happened prior to 2012 or prior to 2014.  However we have been unable to get the CT scan CD-ROM from [facility]  She is having a CT angios neck coming up with Dr. Floyde Parkins of neurology/CT scan will  be done at [place].  She is interested in getting a CT scan of the chest if we can coincide the appointments.  She understands that if her sarcoidosis shows progression that she will not be interested in methotrexate or other immunomodulators.  She understands the risk of radiation.  However she is willing to undergo the CT scan to have a sense of a progression versus stability.        ROS - per HPI     OV 12/08/2019  Subjective:  Patient ID: [name], female , DOB: 09-23-1930 , age 54 y.o. , MRN: [id number] , ADDRESS: [address] [place] [address]    Follow-up sarcoidosis pulmonary with ocular sarcoid right middle lobe syndrome and chronic cough.  Previously on methotrexate.  Currently on prednisone 5 mg/day.  12/08/2019 -   Chief Complaint  Patient presents with  . Follow-up    No complaints of SOB/ coughing or whezzing     HPI Jesyka L Kalp 86 y.o. -returns for follow-up for right middle lobe syndrome with  chronic cough.  Currently on prednisone 5 mg/day.  Last fall when she visited with me we had a CT scan of the chest that is shown improvement/stability on the report.  Currently she is without any cough.  She continues on 5 mg prednisone per day.  She does not want to lower the dose of the prednisone.  She is happy with this.  She also wants to continue with her fluticasone.  She wants refills of this.  She is up-to-date with the Covid vaccine.  She is going to have a jaw MRI because of jaw pain but otherwise is doing well.     Dr Lorenza [place] Reflux Symptom Index (> 13-15 suggestive of LPR cough) Dec 2016 10/18/2015  04/22/2016  10/28/2016  01/13/2017  05/26/2019   Hoarseness of problem with voice 2 3 3 1 1  0  Clearing  Of Throat 3 2 1 1  0 1  Excess throat mucus or feeling of post nasal drip 1 1 0 1 0 0  Difficulty swallowing food, liquid or tablets 2 1 0 1 0 0  Cough after eating or lying down 3 0 0 0 0 0  Breathing difficulties or choking episodes 1 0 0 0 2 0  Troublesome or annoying cough 3 1 0 0 0 1  Sensation of something sticking in throat or lump in throat 0 1 0 0 0 0  Heartburn, chest pain, indigestion, or stomach acid coming up 3 1 2 1 1  0  TOTAL 18 10 6 5 3 2     ROS - per HPI  IMPRESSION: 1. Pulmonary parenchymal sarcoidosis, primarily improved. The only area of progression is in the central right lower lobe. 2. Slight improved appearance of the right middle lobe, with decrease in collapse/consolidation and airway compression. 3. No thoracic adenopathy. 4. Pulmonary artery enlargement suggests pulmonary arterial hypertension.   Electronically Signed   By: Abigail Miyamoto M.D.   On: 06/01/2019 14:58   has a past medical history of Alopecia (10/28/2016), Arthritis, Cervical spondylosis, Chest pain (07/28/2008), Chronic cough (07/12/2014), Collapsed lung ( [id number]), Complication of anesthesia, Cough (07/30/2010), Cystocele (05/23/2015), Cystocele, midline (05/02/2013), DEEP VEIN  THROMBOSIS/PHLEBITIS (07/30/2010), Dislocation closed, shoulder (7/14), DVT (deep venous thrombosis) ([place]) (2006), Encounter for therapeutic drug monitoring (10/03/2014), GERD (gastroesophageal reflux disease), Headache, High cholesterol, History of recurrent UTIs (09/24/2011), HYPERLIPIDEMIA (07/30/2010), Hypertension, HYPERTENSION (07/30/2010), Hypothyroidism, Nasal itching (01/30/2014), Osteopenia (05/23/2015), Peripheral vascular disease ([place]) (2006), Pleuritic pain (  12/17/2015), PONV (postoperative nausea and vomiting), Primary localized osteoarthritis of right hip (08/26/2016), Primary osteoarthritis of right hip (08/26/2016), Pulmonary collapse (07/30/2010), PULMONARY NODULE (07/30/2010), Rash and nonspecific skin eruption (01/02/2015), Right middle lobe syndrome (01/30/2014), Sarcoidosis, Sarcoidosis of lung ([place]), Vaginal atrophy (09/24/2011), and Visual field defect nasal step (01/02/2015).   reports that she has never smoked. She has never used smokeless tobacco.  Past Surgical History:  Procedure Laterality Date  . EYE SURGERY    . FOOT SURGERY Left 06/2005   bunion hammer toe  . HEMORRHOID SURGERY  12/1972  . INTRAOCULAR LENS INSERTION     right eye 10 /16 and left 06/16/2005  . JOINT REPLACEMENT    . SKIN BIOPSY  01/2007   basal cell carcinoma removed from right lower leg   . TOTAL HIP ARTHROPLASTY  06/2007   left  . TOTAL HIP ARTHROPLASTY Right 08/26/2016   Procedure: TOTAL HIP ARTHROPLASTY ANTERIOR APPROACH;  Surgeon: Melrose Nakayama, MD;  Location: [facility];  Service: Orthopedics;  Laterality: Right;  . TOTAL KNEE ARTHROPLASTY Right 10/2007   right    Allergies  Allergen Reactions  . Methotrexate Derivatives     Mild alopecia     Immunization History  Administered Date(s) Administered  . DTaP 12/10/1995  . Hepatitis A 12/10/1999  . Influenza Split 04/11/2013, 04/11/2014  . Influenza Whole 04/15/2010  . Influenza, High Dose Seasonal PF 04/17/2018, 04/21/2019  . Influenza,inj,Quad  PF,6+ Mos 05/07/2015, 04/11/2016  . Influenza-Unspecified 05/09/2017  . Moderna SARS-COVID-2 Vaccination 08/23/2019, 09/20/2019  . Pneumococcal Conjugate-13 04/28/2017  . Pneumococcal Polysaccharide-23 05/13/2004  . Pneumococcal-Unspecified 08/11/2010, 03/11/2014  . Td 02/08/2002  . Tdap 08/11/2012  . Zoster 06/12/2007    Family History  Problem Relation Age of Onset  . Diabetes Mother   . Heart failure Mother   . Hypertension Father   . Diabetes Father   . Heart disease Father   . Heart attack Father      Current Outpatient Medications:  .  amLODipine (NORVASC) 10 MG tablet, Take 10 mg by mouth daily., Disp: , Rfl:  .  ascorbic acid (VITAMIN C) 1000 MG tablet, Take 500 mg by mouth daily., Disp: , Rfl:  .  azelastine (OPTIVAR) 0.05 % ophthalmic solution, Place 1 drop into both eyes 2 (two) times daily. , Disp: , Rfl:  .  fluticasone (FLONASE) 50 MCG/ACT nasal spray, Place 2 sprays into both nostrils daily., Disp: 48 g, Rfl: 5 .  gemfibrozil (LOPID) 600 MG tablet, Take 600 mg by mouth 2 (two) times daily before a meal., Disp: , Rfl:  .  levothyroxine (SYNTHROID, LEVOTHROID) 25 MCG tablet, Take 25 mcg by mouth daily. , Disp: , Rfl:  .  LORazepam (ATIVAN) 0.5 MG tablet, Take 0.5 mg by mouth every 8 (eight) hours as needed for anxiety or sleep. , Disp: , Rfl:  .  losartan (COZAAR) 50 MG tablet, Take 50 mg by mouth daily., Disp: , Rfl:  .  Multiple Vitamin (MULTIVITAMIN) capsule, Take 1 capsule by mouth daily., Disp: , Rfl:  .  nitroGLYCERIN (NITROSTAT) 0.4 MG SL tablet, Place 0.4 mg under the tongue as needed., Disp: , Rfl:  .  polyethylene glycol powder (GLYCOLAX/MIRALAX) 17 GM/SCOOP powder, Take 17 g by mouth daily., Disp: , Rfl:  .  predniSONE (DELTASONE) 5 MG tablet, Take 1 tablet (5 mg total) by mouth daily with breakfast., Disp: 90 tablet, Rfl: 1 .  LORazepam (ATIVAN) 1 MG tablet, Take 0.5 mg by mouth every 8 (eight) hours as needed.  As needed, Disp: , Rfl:  .  UNABLE TO FIND,  Med Name: CA 2- 600mg  qd, Disp: , Rfl:       Objective:   Vitals:   12/08/19 1120  BP: (!) 144/68  Pulse: 86  Temp: (!) 97 F (36.1 C)  TempSrc: Temporal  SpO2: 98%  Weight: 135 lb 12.8 oz (61.6 kg)  Height: 5\' 2"  (1.575 m)    Estimated body mass index is 24.84 kg/m as calculated from the following:   Height as of this encounter: 5\' 2"  (1.575 m).   Weight as of this encounter: 135 lb 12.8 oz (61.6 kg).  @WEIGHTCHANGE @  [employer]   12/08/19 1120  Weight: 135 lb 12.8 oz (61.6 kg)     Physical Exam  General Appearance:    Alert, cooperative, no distress, appears stated age - yes , Deconditioned looking - no , OBESE  - no, Sitting on Wheelchair -  no  Head:    Normocephalic, without obvious abnormality, atraumatic  Eyes:    PERRL, conjunctiva/corneas clear,  Ears:    Normal TM's and external ear canals, both ears  Nose:   Nares normal, septum midline, mucosa normal, no drainage    or sinus tenderness. OXYGEN ON  - no . Patient is @ ra   Throat:   Lips, mucosa, and tongue normal; teeth and gums normal. Cyanosis on lips - no  Neck:   Supple, symmetrical, trachea midline, no adenopathy;    thyroid:  no enlargement/tenderness/nodules; no carotid   bruit or JVD  Back:     Symmetric, no curvature, ROM normal, no CVA tenderness  Lungs:     Distress - no , Wheeze no, Barrell Chest - no, Purse lip breathing - no, Crackles - no   Chest Wall:    No tenderness or deformity.    Heart:    Regular rate and rhythm, S1 and S2 normal, no rub   or gallop, Murmur - no  Breast Exam:    NOT DONE  Abdomen:     Soft, non-tender, bowel sounds active all four quadrants,    no masses, no organomegaly. Visceral obesity - no  Genitalia:   NOT DONE  Rectal:   NOT DONE  Extremities:   Extremities - normal, Has Cane - no, Clubbing - no, Edema - no  Pulses:   2+ and symmetric all extremities  Skin:   Stigmata of Connective Tissue Disease - no  Lymph nodes:   Cervical, supraclavicular, and  axillary nodes normal  Psychiatric:  Neurologic:   Pleasant - yes, Anxious - no, Flat affect - no  CAm-ICU - neg, Alert and Oriented x 3 - yes, Moves all 4s - yes, Speech - normal, Cognition - intact           Assessment:       ICD-10-CM   1. 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 Chronic cough  R05        Plan:     Patient Instructions     ICD-10-CM   1. 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 Chronic cough  R05     Minimal to no symptoms and well-controlled on prednisone 5 mg/day and nasal steroid CT scan last year showed stability/improvement  Plan -shared decision making -Continue prednisone 5 mg/day -take 30-day supply or 90-day supply as you see fit with refills for a year -Continue generic fluticasone  nasal spray 2 squirts daily  Follow-up -9 months for clinical follow-up; sooner if needed     SIGNATURE    Dr. Brand Males, M.D., F.C.C.P,  Pulmonary and Critical Care Medicine Staff Physician, [facility] Director - Interstitial Lung Disease  Program  Pulmonary [place] at [place], [place], [address]  Pager: [phone], If no answer or between  15:00h - 7:00h: call [phone] Telephone: [phone]  11:38 AM 12/08/2019

## 2019-12-09 ENCOUNTER — Ambulatory Visit
Admission: RE | Admit: 2019-12-09 | Discharge: 2019-12-09 | Disposition: A | Discharge status: Home / Self Care | Payer: Medicare PPO | Source: Ambulatory Visit | Attending: General Practice | Admitting: General Practice

## 2019-12-09 DIAGNOSIS — R52 Pain, unspecified: Secondary | ICD-10-CM

## 2020-01-15 ENCOUNTER — Emergency Department (HOSPITAL_COMMUNITY): Payer: Medicare PPO

## 2020-01-15 ENCOUNTER — Other Ambulatory Visit: Payer: Self-pay

## 2020-01-15 ENCOUNTER — Emergency Department (HOSPITAL_COMMUNITY)
Admission: EM | Admit: 2020-01-15 | Discharge: 2020-01-15 | Disposition: A | Discharge status: Home / Self Care | Payer: Medicare PPO | Attending: Emergency Medicine | Admitting: Emergency Medicine

## 2020-01-15 DIAGNOSIS — Z86718 Personal history of other venous thrombosis and embolism: Secondary | ICD-10-CM | POA: Insufficient documentation

## 2020-01-15 DIAGNOSIS — Z79899 Other long term (current) drug therapy: Secondary | ICD-10-CM | POA: Diagnosis not present

## 2020-01-15 DIAGNOSIS — S39012A Strain of muscle, fascia and tendon of lower back, initial encounter: Secondary | ICD-10-CM | POA: Insufficient documentation

## 2020-01-15 DIAGNOSIS — X500XXA Overexertion from strenuous movement or load, initial encounter: Secondary | ICD-10-CM | POA: Insufficient documentation

## 2020-01-15 DIAGNOSIS — I10 Essential (primary) hypertension: Secondary | ICD-10-CM | POA: Diagnosis not present

## 2020-01-15 DIAGNOSIS — Y939 Activity, unspecified: Secondary | ICD-10-CM | POA: Diagnosis not present

## 2020-01-15 DIAGNOSIS — Y929 Unspecified place or not applicable: Secondary | ICD-10-CM | POA: Diagnosis not present

## 2020-01-15 DIAGNOSIS — Y999 Unspecified external cause status: Secondary | ICD-10-CM | POA: Insufficient documentation

## 2020-01-15 DIAGNOSIS — M545 Low back pain, unspecified: Secondary | ICD-10-CM

## 2020-01-15 DIAGNOSIS — M25551 Pain in right hip: Secondary | ICD-10-CM

## 2020-01-15 DIAGNOSIS — T148XXA Other injury of unspecified body region, initial encounter: Secondary | ICD-10-CM

## 2020-01-15 MED ORDER — CYCLOBENZAPRINE HCL 10 MG PO TABS
10.0000 mg | ORAL_TABLET | Freq: Two times a day (BID) | ORAL | 0 refills | Status: DC | PRN
Start: 2020-01-15 — End: 2020-07-25

## 2020-01-15 MED ORDER — LIDOCAINE 5 % EX PTCH: 1.0000 | MEDICATED_PATCH | CUTANEOUS | 0 refills | Status: DC

## 2020-01-15 MED ORDER — HYDROCODONE-ACETAMINOPHEN 5-325 MG PO TABS
1.0000 | ORAL_TABLET | Freq: Once | ORAL | Status: AC
  Administered 2020-01-15: 1 via ORAL
  Filled 2020-01-15: qty 1

## 2020-01-15 MED ORDER — LIDOCAINE 5 % EX PTCH
1.0000 | MEDICATED_PATCH | Freq: Once | CUTANEOUS | Status: DC
  Administered 2020-01-15: 1 via TRANSDERMAL
  Filled 2020-01-15: qty 1

## 2020-01-15 NOTE — ED Provider Notes (Signed)
[facility] EMERGENCY DEPARTMENT Provider Note   CSN: [id number] Arrival date & time: 01/15/20  1026     History Chief Complaint  Patient presents with  . Back Pain    [name] Page is a 86 y.o. female with PMHx sarcoidosis (on prednisone currently), HTN, HLD, GERD, and hx of bilateral hip replacements who presents to the ED today via EMS with complaint of sudden onset, constant, worsening, right lower back/hip pain x 3 days. Patient reports she was bending over to pick something up felt a sharp pain in her back. She states that she was on her way to go swimming at the assisted living facility that she lives out, she went swimming without any difficulties however afterwards noticed a severe worsening pain. She states she normally ambulates without any assistive devices however yesterday needed to ambulate with a cane. Today her pain was so excruciating and she states she could not ambulate, take her to call EMS today. She states that she scheduled an appointment with her orthopedist on Monday however could not wait due to pain. She took 800 mg ibuprofen earlier today without relief. She denies fevers, chills, urinary retention, urinary or bowel incontinence, saddle anesthesia, any other associated symptoms.   The history is provided by the patient and medical records.       Past Medical History:  Diagnosis Date  . Alopecia 10/28/2016  . Arthritis   . Cervical spondylosis    C3-4 AND C4-5  . Chest pain 07/28/2008   H/O, normal stress nuclear EF 78%  . Chronic cough 07/12/2014  . Collapsed lung  12/11   being treated at [facility] - in left lung  . Complication of anesthesia   . Cough 07/30/2010   Qualifier: Diagnosis of  By: Chase Caller MD, Murali    . Cystocele 05/23/2015  . Cystocele, midline 05/02/2013  . DEEP VEIN THROMBOSIS/PHLEBITIS 07/30/2010   Qualifier: History of  By: Harvest Dark CMA, Anderson Malta    . Dislocation closed, shoulder 7/14  . DVT (deep venous thrombosis)  ([place]) 2006  . Encounter for therapeutic drug monitoring 10/03/2014  . GERD (gastroesophageal reflux disease)    occ  . Headache    hx  . High cholesterol   . History of recurrent UTIs 09/24/2011  . HYPERLIPIDEMIA 07/30/2010   Qualifier: Diagnosis of  By: Harvest Dark CMA, Anderson Malta    . Hypertension   . HYPERTENSION 07/30/2010   Qualifier: Diagnosis of  By: Harvest Dark CMA, Anderson Malta    . Hypothyroidism   . Nasal itching 01/30/2014  . Osteopenia 05/23/2015  . Peripheral vascular disease ([place]) 2006   dvt's and 50 yrs ago  . Pleuritic pain 12/17/2015  . PONV (postoperative nausea and vomiting)   . Primary localized osteoarthritis of right hip 08/26/2016  . Primary osteoarthritis of right hip 08/26/2016  . Pulmonary collapse 07/30/2010   Qualifier: Diagnosis of  By: Chase Caller MD, Murali    . PULMONARY NODULE 07/30/2010   Qualifier: Diagnosis of  By: Chase Caller MD, Murali    . Rash and nonspecific skin eruption 01/02/2015  . 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 Sarcoidosis   . Sarcoidosis of lung ([place])   . Vaginal atrophy 09/24/2011  . Visual field defect nasal step 01/02/2015    Patient Active Problem List   Diagnosis Date Noted  . Vasovagal syncope 09/20/2019  . Alopecia 10/28/2016  . Primary localized osteoarthritis of right hip 08/26/2016  . Primary osteoarthritis of right hip 08/26/2016  . Pleuritic pain 12/17/2015  .  Osteopenia 05/23/2015  . Cystocele 05/23/2015  . Rash and nonspecific skin eruption 01/02/2015  . Visual field defect nasal step 01/02/2015  . Encounter for therapeutic drug monitoring 10/03/2014  . Chronic cough 07/12/2014  . Nasal itching 01/30/2014  . 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 Sarcoidosis 01/30/2014  . Cystocele, midline 05/02/2013  . Chest pain   . History of recurrent UTIs 09/24/2011  . Vaginal atrophy 09/24/2011  . HYPERLIPIDEMIA 07/30/2010  . HYPERTENSION 07/30/2010  . DEEP VEIN THROMBOSIS/PHLEBITIS 07/30/2010  . PULMONARY COLLAPSE  07/30/2010  . PULMONARY NODULE 07/30/2010  . COUGH 07/30/2010    Past Surgical History:  Procedure Laterality Date  . EYE SURGERY    . FOOT SURGERY Left 06/2005   bunion hammer toe  . HEMORRHOID SURGERY  12/1972  . INTRAOCULAR LENS INSERTION     right eye 10 /16 and left 06/16/2005  . JOINT REPLACEMENT    . SKIN BIOPSY  01/2007   basal cell carcinoma removed from right lower leg   . TOTAL HIP ARTHROPLASTY  06/2007   left  . TOTAL HIP ARTHROPLASTY Right 08/26/2016   Procedure: TOTAL HIP ARTHROPLASTY ANTERIOR APPROACH;  Surgeon: Melrose Nakayama, MD;  Location: [facility];  Service: Orthopedics;  Laterality: Right;  . TOTAL KNEE ARTHROPLASTY Right 10/2007   right     OB History    Gravida  2   Para  2   Term  2   Preterm      AB      Living  2     SAB      TAB      Ectopic      Multiple      Live Births  2           Family History  Problem Relation Age of Onset  . Diabetes Mother   . Heart failure Mother   . Hypertension Father   . Diabetes Father   . Heart disease Father   . Heart attack Father     Social History   Tobacco Use  . Smoking status: Never Smoker  . Smokeless tobacco: Never Used  Substance Use Topics  . Alcohol use: Yes    Comment: BEER/glass of wine A DAY  . Drug use: No    Home Medications Prior to Admission medications   Medication Sig Start Date End Date Taking? Authorizing Provider  amLODipine (NORVASC) 10 MG tablet Take 10 mg by mouth daily.    [provider]  ascorbic acid (VITAMIN C) 1000 MG tablet Take 500 mg by mouth daily. 01/29/11   [provider]  azelastine (OPTIVAR) 0.05 % ophthalmic solution Place 1 drop into both eyes 2 (two) times daily.     [provider]  cyclobenzaprine (FLEXERIL) 10 MG tablet Take 1 tablet (10 mg total) by mouth 2 (two) times daily as needed for muscle spasms. 01/15/20   Theodor Mustin, PA-C  fluticasone (FLONASE) 50 MCG/ACT nasal spray Place 2 sprays into both nostrils  daily. 05/26/19   Brand Males, MD  gemfibrozil (LOPID) 600 MG tablet Take 600 mg by mouth 2 (two) times daily before a meal.    [provider]  levothyroxine (SYNTHROID, LEVOTHROID) 25 MCG tablet Take 25 mcg by mouth daily.     [provider]  lidocaine (LIDODERM) 5 % Place 1 patch onto the skin daily. Remove & Discard patch within 12 hours or as directed by MD 01/15/20   Eustaquio Maize, PA-C  LORazepam (ATIVAN) 0.5  MG tablet Take 0.5 mg by mouth every 8 (eight) hours as needed for anxiety or sleep.  03/26/15   [provider]  LORazepam (ATIVAN) 1 MG tablet Take 0.5 mg by mouth every 8 (eight) hours as needed. As needed 05/19/19   [provider]  losartan (COZAAR) 50 MG tablet Take 50 mg by mouth daily.    [provider]  Multiple Vitamin (MULTIVITAMIN) capsule Take 1 capsule by mouth daily.    [provider]  nitroGLYCERIN (NITROSTAT) 0.4 MG SL tablet Place 0.4 mg under the tongue as needed. 07/26/19   [provider]  polyethylene glycol powder (GLYCOLAX/MIRALAX) 17 GM/SCOOP powder Take 17 g by mouth daily. 07/26/19   [provider]  predniSONE (DELTASONE) 5 MG tablet Take 1 tablet (5 mg total) by mouth daily with breakfast. 12/08/19   Brand Males, MD  UNABLE TO FIND Med Name: CA 2- 600mg  qd    [provider]    Allergies    Methotrexate derivatives  Review of Systems   Review of Systems  Constitutional: Negative for chills and fever.  Genitourinary: Negative for difficulty urinating.  Musculoskeletal: Positive for arthralgias and back pain.  Neurological: Negative for weakness and numbness.  All other systems reviewed and are negative.   Physical Exam Updated Vital Signs BP (!) 161/62 (BP Location: Right Arm)   Pulse 61   Temp 97.7 F (36.5 C) (Oral)   Resp 17   Ht 5\' 2"  (1.575 m)   Wt 61.2 kg   SpO2 99%   BMI 24.69 kg/m   Physical Exam Vitals and nursing note reviewed.    Constitutional:      Appearance: She is not ill-appearing or diaphoretic.  HENT:     Head: Normocephalic and atraumatic.  Eyes:     Conjunctiva/sclera: Conjunctivae normal.  Cardiovascular:     Rate and Rhythm: Normal rate and regular rhythm.     Pulses: Normal pulses.  Pulmonary:     Effort: Pulmonary effort is normal.     Breath sounds: Normal breath sounds. No wheezing, rhonchi or rales.  Abdominal:     Palpations: Abdomen is soft.     Tenderness: There is no abdominal tenderness.  Musculoskeletal:     Cervical back: Neck supple.     Comments: No overlying skin changes to back including ecchymosis. No C, T, or L midline spinal TTP. + right lumbar paraspinal musculature TTP as well as TTP to hip along PSIS joint. No obvious pain to greater trochanter. No leg length discrepancy. No pain with log roll of leg. Mild pain illicited with flexion of hip passively. Strength and sensation intact. 2+ DP pulses.   Skin:    General: Skin is warm and dry.  Neurological:     Mental Status: She is alert.     ED Results / Procedures / Treatments   Labs (all labs ordered are listed, but only abnormal results are displayed) Labs Reviewed - No data to display  EKG None  Radiology DG Lumbar Spine Complete  Result Date: 01/15/2020 CLINICAL DATA:  Back pain for 3 days EXAM: LUMBAR SPINE - COMPLETE 4+ VIEW COMPARISON:  07/08/2017 FINDINGS: Osteopenia. No fracture or dislocation of the lumbar spine. Mild multilevel disc space height loss and osteophytosis throughout. Moderate to severe multilevel facet degenerative change. Aortic atherosclerosis nonobstructive pattern of overlying bowel gas. IMPRESSION: 1. Osteopenia. No displaced fracture or dislocation of the lumbar spine. 2. Mild multilevel disc space height loss and osteophytosis throughout. Moderate to  severe multilevel facet degenerative change. Electronically Signed   By: Eddie Candle M.D.   On: 01/15/2020 12:09   DG HIP UNILAT WITH PELVIS  2-3 VIEWS RIGHT  Result Date: 01/15/2020 CLINICAL DATA:  Hip pain EXAM: DG HIP (WITH OR WITHOUT PELVIS) 2-3V RIGHT COMPARISON:  09/17/2019 FINDINGS: Osteopenia. Status post bilateral hip total arthroplasty. No evidence of perihardware fracture or loosening of the right hip. No displaced fracture of the pelvis. IMPRESSION: 1. Osteopenia. No displaced fracture or dislocation of the pelvis. Please note that plain radiographs are insensitive for hip and pelvic fracture in the setting of osteopenia. 2. No evidence of perihardware fracture or loosening of the right hip status post total arthroplasty. Electronically Signed   By: Eddie Candle M.D.   On: 01/15/2020 12:06    Procedures Procedures (including critical care time)  Medications Ordered in ED Medications  lidocaine (LIDODERM) 5 % 1 patch (has no administration in time range)  HYDROcodone-acetaminophen (NORCO/VICODIN) 5-325 MG per tablet 1 tablet (1 tablet Oral Given 01/15/20 1117)    ED Course  I have reviewed the triage vital signs and the nursing notes.  Pertinent labs & imaging results that were available during my care of the patient were reviewed by me and considered in my medical decision making (see chart for details).    MDM Rules/Calculators/A&P                      86 year old female who presents to the ED today complaining of atraumatic right lower back pain that started 2 to 3 days ago after she bent down to pick something up.  She has been able to walk with her cane at home but states worsening pain today prompting her to call EMS.  On arrival to the ED patient is afebrile, nontachycardic and nontachypneic.  She pursue no acute distress.  She has no midline spinal tenderness, right lumbar paraspinal tenderness palpation.  She does have a history of bilateral hip replacements however no obvious pain to the hip.  She has no red flag symptoms today concerning for cauda equina, spinal epidural abscess, AAA.  Will obtain x-rays of the  hip as well as the low back and reevaluate.  If no obvious findings on x-ray will plan to discharge.  This case with attending physician Dr. Ronnald Nian who agrees with plan.  Xrays negative at this time.  A small dose of Norco in the ED with some relief.  Have applied lidocaine patch as well.  Will discharge home with instructions for 400 mg ibuprofen as needed for pain, will add on Flexeril, plan for PCP follow-up tomorrow or the next day.  Strict return precautions have been discussed with patient.  She is in agreement with plan and stable for discharge home.  This note was prepared using Dragon voice recognition software and may include unintentional dictation errors due to the inherent limitations of voice recognition software.  Final Clinical Impression(s) / ED Diagnoses Final diagnoses:  Acute right-sided low back pain without sciatica  Muscle strain    Rx / DC Orders ED Discharge Orders         Ordered    cyclobenzaprine (FLEXERIL) 10 MG tablet  2 times daily PRN     01/15/20 1225    lidocaine (LIDODERM) 5 %  Every 24 hours     01/15/20 1225           Discharge Instructions     Your xrays did not show any signs  of fractures. Your pain may be related to muscle strain/spasms.  Take 400 mg Ibuprofen as needed for pain. I have also sent prescriptions for a muscle relaxer and lidocaine patches. The muscle relaxer can make you drowsy - do not drive while on this medication.  Follow up with your PCP regarding your ED visit today. I recommend calling them in the morning to schedule an appointment.  Return to the ED for any worsening symptoms including worsening pain, inability to walk, numbness in your groin, weakness/numbness in your legs, holding onto urine, peeing or pooping on yourself, or any other new/concerning symptoms.        Eustaquio Maize, PA-C 01/15/20 [place], [place], DO 01/15/20 1539

## 2020-01-15 NOTE — ED Triage Notes (Signed)
Pt arrives from home via [facility] with complaints of back pain X3 days. Pt was bending over picking something up when she felt a sharp pain. Tender to touch. Hx of hip replacement. Pain 9/10 with movement. Wanted to wait until [place] to see ortho but could not wait d/t pain.   800 Ibuprofen without relief.

## 2020-01-15 NOTE — ED Provider Notes (Signed)
Medical screening examination/treatment/procedure(s) were conducted as a shared visit with non-physician practitioner(s) and myself.  I personally evaluated the patient during the encounter. Briefly, the patient is a 86 y.o. female with history of hypertension, high cholesterol who presents to the ED with low back pain.  Patient with right-sided lower back pain for the last several days.  Had sudden right lower back pain when she bent over to pick something up.  Felt like she had a spasm in her back.  Has been able to walk with walker at home but with discomfort.  Denies any loss of bowel or bladder.  No saddle anesthesia.  Overall is a normal neurological exam.  Normal strength and sensation in her lower extremities.  Tenderness to the paraspinal muscles of the righ lower back.  X-ray showed no acute fracture of the low spine or right hip.  Does not really have any focal hip tenderness on exam.  Is ambulatory.  Have low suspicion for an occult hip fracture.  Patient to continue Motrin and will add muscle relaxant at home.  Recommend follow-up with primary care doctor.  Understands return precautions.  This chart was dictated using voice recognition software.  Despite best efforts to proofread,  errors can occur which can change the documentation meaning.     EKG Interpretation None           Lennice Sites, DO 01/15/20 1221

## 2020-01-15 NOTE — Discharge Instructions (Signed)
Your xrays did not show any signs of fractures. Your pain may be related to muscle strain/spasms.  Take 400 mg Ibuprofen as needed for pain. I have also sent prescriptions for a muscle relaxer and lidocaine patches. The muscle relaxer can make you drowsy - do not drive while on this medication.  Follow up with your PCP regarding your ED visit today. I recommend calling them in the morning to schedule an appointment.  Return to the ED for any worsening symptoms including worsening pain, inability to walk, numbness in your groin, weakness/numbness in your legs, holding onto urine, peeing or pooping on yourself, or any other new/concerning symptoms.

## 2020-01-17 DIAGNOSIS — M5136 Other intervertebral disc degeneration, lumbar region: Secondary | ICD-10-CM | POA: Insufficient documentation

## 2020-03-12 ENCOUNTER — Telehealth: Payer: Self-pay | Admitting: Internal Medicine

## 2020-03-12 NOTE — Telephone Encounter (Signed)
Called and spoke with pt. Pt stated that she wanted to change her dose of prednisone from 5mg  to 4mg  which was discussed at last OV. Stated to pt that I would send this to MR for him to review and once he responded we would let her know what he said. Pt is aware that MR is out of office until 8/17.  Routing message to MR.

## 2020-03-15 MED ORDER — PREDNISONE 2 MG PO TBEC: 4.0000 mg | DELAYED_RELEASE_TABLET | Freq: Every day | ORAL | 1 refills | Status: DC

## 2020-03-15 NOTE — Telephone Encounter (Signed)
rx of 4mg  prednisone daily sent to [facility]. Left message for pt to make her aware.

## 2020-03-15 NOTE — Telephone Encounter (Signed)
Thanks Opal Sidles! Will close encounter.

## 2020-03-15 NOTE — Telephone Encounter (Signed)
Yes this is fine to reduce to 4mg  per day of prednisone

## 2020-03-15 NOTE — Telephone Encounter (Signed)
Pt returned call, I let her know.

## 2020-03-16 ENCOUNTER — Telehealth: Payer: Self-pay | Admitting: Internal Medicine

## 2020-03-16 MED ORDER — PREDNISONE 1 MG PO TABS
4.0000 mg | ORAL_TABLET | Freq: Every day | ORAL | 6 refills | Status: DC
Start: 2020-03-16 — End: 2020-11-28

## 2020-03-16 NOTE — Telephone Encounter (Signed)
Pharmacy calling to report Prednisone 2 mg TBEC is 6,000.00 a month. This is an extended release medication. Order changed to Prednisone 1 mg tablet, dose is 4 mg per day per Dr. Chase Caller. Order changed, routed for Dr. Chase Caller as Juluis Rainier.

## 2020-05-15 ENCOUNTER — Telehealth: Payer: Self-pay | Admitting: Internal Medicine

## 2020-05-15 NOTE — Telephone Encounter (Signed)
Ok to go back to 5mg  per day of the steroid

## 2020-05-15 NOTE — Telephone Encounter (Signed)
ATC patient, left vm for patient to return call.

## 2020-05-15 NOTE — Telephone Encounter (Signed)
Patient called back, wishes to switch her maintenance prednisone from 4mg  up to 5mg .  Pt has been on 4mg  daily X2 months for treatment of sarcoidosis.  Pt states that she has noticed gradual increased mucus production after exertion since being on 4mg  daily.  Mucus is clear/white.  Pt also notes that it takes her less exertion to experience SOB on 4mg  daily of prednisone.  Pharmacy: [facility].    MR please advise on recs.  Thanks!

## 2020-05-16 NOTE — Telephone Encounter (Signed)
ATC patient, left VM letting patient know it is ok to go back on 5 mg of her steroid per day per Dr. Chase Caller and to call back for any questions.

## 2020-07-25 ENCOUNTER — Other Ambulatory Visit: Payer: Self-pay

## 2020-07-25 ENCOUNTER — Encounter: Payer: Self-pay | Admitting: Internal Medicine

## 2020-07-25 ENCOUNTER — Ambulatory Visit (INDEPENDENT_AMBULATORY_CARE_PROVIDER_SITE_OTHER): Payer: Medicare PPO

## 2020-07-25 ENCOUNTER — Ambulatory Visit: Payer: Medicare PPO | Admitting: Internal Medicine

## 2020-07-25 VITALS — BP 140/64 | HR 71 | Temp 97.2°F | Ht 62.0 in | Wt 131.8 lb

## 2020-07-25 DIAGNOSIS — R12 Heartburn: Secondary | ICD-10-CM

## 2020-07-25 DIAGNOSIS — R053 Chronic cough: Secondary | ICD-10-CM

## 2020-07-25 DIAGNOSIS — J9819 Other pulmonary collapse: Secondary | ICD-10-CM

## 2020-07-25 DIAGNOSIS — D869 Sarcoidosis, unspecified: Secondary | ICD-10-CM

## 2020-07-25 NOTE — Progress Notes (Signed)
OV 08/24/2014  Chief Complaint  Patient presents with  . Follow-up    Pt stated the gabapentin has not changed her breathing or cough. Pt c/o prod cough with white mucus. Pt denies SOB and CP/tightness.    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 Main symptom is severe chronic cough with high RSI score suggestive of irritable larynx syndrome that is associated as well  She always finds prednisone to help the cough but due to age and side effects despite good functional status she opts to take a low-dose of prednisone. Currently she is on 5 mg prednisone. In November 2015 due to RSI score of 27 for cough but decided to trial gabapentin to tackle the neurogenic component of cough. She reports now that the gabapentin has not helped her at all although objectively the RSI cough score has drop to 20. She is very frustrated. She and her husband are reluctant to have right middle lobectomy given her advanced age despite good functional status. There are no open to trying methotrexate to tackle the inflammation of sarcoidosis and as a steroid sparing agent. She also wants to come off gabapentin   OV 10/02/2014  Chief Complaint  Patient presents with  . Follow-up    Pt states her breathing is doing well. Pt states her SOB has improved since last OV. Pt c/o mild cough. Pt denies CP/tightness. Pt stated being on methotrexate has significantly helped.     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 Main symptom is severe chronic cough with high RSI score suggestive of irritable larynx syndrome that is associated as well    Last visit in January 2016 she told me that gabapentin did not help her cough. So we stopped this. We started methotrexate for inflammation in her airway due to  sarcoid causing right middle lobe collapse. We left her on prednisone 5 mg per day and started her on  methotrexate 5 mg once a week. Also gave Hycodan cough syrup. She says that the Hycodan cough syrup did not help However, 2 weeks after starting her methotrexate she started noticing dramatic improvement in her cough. She hardly coughs now. She has significant improvement in her quality of life and she is extremely happy.  RSI cough score had dropped from 25 to 20 with gabapentin has now dropped to 5.  She is asking questions about how long to take methotrexate. And also review of the side effect profile. Of note I did notice that she is on ACE inhibitor that can potentially provoke all make her cough worse. She is not taking gabapentin anymore. There is no fever or chills. At this point in time she's not interested in Bactrim PCP prophylaxis which should be okay given low-dose of methotrexate and prednisone  Past, Family, Social reviewed: no change since last visit    OV 01/02/2015  Chief Complaint  Patient presents with  . Follow-up    Pt recently went to opthamologist and pts vision has decreased since last OV. Pt stated her breathing is unchanged since last OV. Pt c/o mild dry cough. Pt denies CP/tightness.     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 Main symptom is severe chronic cough with high RSI score suggestive of irritable larynx syndrome that is associated as well  Cough /Saarcoid / IRritiable LArynx/ RML syundrome - cough persists but is only mild and baseline. No  problems. Does not feel it impacts qualkity of life. She is off ace inhibitor now  ; forgot to mention this during med reconciliation. No asociated wheeze or dyspnea. Maintained on mehtotrxate 5mg  weekly, folic acid and prednisone 5mg  daily. No evidence of infection. Last LFT was 3 months ago and normal. Hycodan helping cough and wants refill  NEw issue - her eye doc Dr Ellie Lunch called yesterday. PAtient has no complaints of vision but post capsulootmy few week ago and now  yesterday visual field deficit on Rt side is worse. Concern is ocular sarcoid is worse. They are referring her to [place]. PAtient does not want step up Rx of sarcoid till all this is sorted out  New issue - noticed some bruising in left forearm yesterday sponatenous x 2 spots. New. Asytmptomatcic and last 1 weeks some punctate red lesion in distal 1/3 of leg. Associatd with gardening but denies poison ivy. Unchanged since insidious onset. No associatd fever.    OV 04/09/2015  Chief Complaint  Patient presents with  . Follow-up    Pt denies cough, SOB, CP/tightness. Pt states her breathing is doing well. Pt states she had a recent rash on BIL lower extremities and saw derm and was given a topical ointment that helped. Per pt the derm thought the rash was not related to sarcoid.     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 Main symptom is severe chronic cough with high RSI score suggestive of irritable larynx syndrome that is associated as well   Cough /Saarcoid / IRritiable LArynx/ RML syundrome - cough is significantly improved. RSI cough score is only 14. She feels that the cough is at a stage where it is completely fine and she can handle things. She is maintained on methotrexate 5 mg weekly, folic acid and prednisone 5 mg daily. She did have some lowish white count after last visit and anemia but we held to the methotrexate and it seems to be stable. She also had a rash at the time of last visit she has seen dermatology and apparently the biopsies do not show this is due to sarcoid. I personally review this outside chart and confirmed that. Of note her last chest x-ray was November 2015 which showed continued right middle lobe collapse. The cough is controlled with the methotrexate and prednisone. She is no longer on ACE inhibitor even though our MAR reports that Also noted that she reports some mild weight gain with prednisone but she says she  will try dietary measures to shake this weight off. She does not want to cough to come and therefore she is content continue with the methotrexate and prednisone. In terms of her vision she is waiting for [facility] referral to neuro ophthalmology in October 2016.    OV 07/16/2015  Chief Complaint  Patient presents with  . Follow-up    Pt states her breathing is unchanged but her cough has worsened. Pt states her SOB is at her baseline. Pt states her cough is nonproductive and worse in the evening. Pt denies CP/tightness.     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 Main symptom is severe chronic cough with high RSI score suggestive of irritable larynx syndrome that is associated as well   Cough /Saarcoid / IRritiable LArynx/ RML syundrome -she now reports that her cough is worse for the last few months especially since last visit. RSI cough score is 18 and subjectively she  feels that cough is worse by 25%. Otherwise she is okay. Cough is associated with worsening postnasal drip. She is also asking for refills on a cough syrup methotrexate folic acid. She prefers to have 90 day refills. Despite cough worsened she is not too keen on gabapentin therapy and wants to try other simpler measures at this point in time. There is no fever or sputum production that is associated with the cough.  In terms of her ophthalmology issues she has been reassured apparently it is a left optic now that is problematic in this 50% gone. No active follow-up.   OV 10/18/2015   Chief Complaint  Patient presents with  . Follow-up    Pt states her cough has significantly improved. Pt denies SOB and CP/tightness. Pt states overall she feels she is doing well.     Follow-up chronic cough associated with right middle lobe syndrome secondary to sarcoidosis and irritable larynx syndrome  Last visit December 2016. At the time cough that deteriorated. The significant  amount of postnasal drip. We instituted nasal steroids and with this cough is significantly improved and back to baseline. RSI cough score is 10 which is around her baseline. She feels immensely better. She is in a good phase of her health. There are no new issues. She is pending $24 and over-the-counter nasal steroid and we discussed about doing a generic prescription which might be cheaper. She wants refill postdated on her cough syrup. She wants 90 day refills on her methotrexate. She is happy with having a next follow-up in 6 months.   OV 04/22/2016  Chief Complaint  Patient presents with  . Follow-up    Pt states her breathing is at baseline. Pt states she is doing well. Pt states has a rare cough, not needing cough syrup any longer. Pt denies CP/tightness and f/c/s.      follow-up chronic cough associated with right middle lobe syndrome secondary to sarcoidosis [also ocular sarcoid] and irritable larynx syndrome  Last visit was me was in March 2017. In the interim she has visited Designer, jewellery. She is on prednisone 5 mg per day and also methotrexate 5 mg once a week.Her cough is significantly improved. She had a chest x-ray May 2017 that I personally visualized shows significant improvement in her right middle lobe laps compared to 2011/2012. She's not on the needing cough syrup. She has right hip DJD now and apparently hip surgery might be contemplated. She is using nasal ster  oid. She does not do Bactrim prophylaxis. RSI cough score show significant improvement with score of 6 which is only mild cough.   OV 10/28/2016  Chief Complaint  Patient presents with  . Follow-up    sarcoidosis, wants to discuss hair loss    Fu Chronic cough due to right middle lobe syndrome due to sarcoidosis with associated with ocular sarcoidosis. On methotrexate 5 mg once weekly associated with prednisone low dose 4 mg/5 mg   She continues to do well. Cough is improved 1 RSI cough score 5 is  documented below. In January 2008 and she had replacement and she had a clear chest x-ray that I personally visualized. The cough is only mild. She has a new problem in the last 6 months of mild alopecia that is persistent and stable. Not associated with any pain or radiation or aggravating or relieving factors. It is associated with methotrexate intake. She wants to know about taking Biotene supplements that was advised by the hairdresser. Husband is wondering  if methotrexate is the etiology for this. Methotrexate can cause less than 10% alopecia not otherwise specified.   OV 01/13/2017  Chief Complaint  Patient presents with  . Follow-up    Pt states her breathing is baseline. Pt states she has weaned off the methotrexate. Pt states she has mild dry cough. Pt denies CP/tightness.     86 year old female follow-up sarcoidosis associated with severe chronic cough right middle lobe syndrome and associated ocular sarcoidosis. Previously on methotrexate but low-dose prednisone currently.  Last visit in March 2018 because of good control cough and new onset of alopecia has mild he stopped the methotrexate. After this she is here for follow-up. She says that her cough has not gotten worse in the absence of methotrexate. Of note methotrexate was the only agent to improve her cough a few years ago. Her alopecia is improving after stopping the methotrexate. She strongly believes the methotrexate as the cause of alopecia and is agreeable for methotrexate to be listed in her allergies. She is continuing 5 mg of redness on her day. She plans to continue this because of ocular sarcoidosis as well. Her weight is causing some easy bruising which she is accepting. Today RSI cough score is only 3 and shows good control of cough    OV 11/10/2017  Chief Complaint  Patient presents with  . Follow-up    Pt states she has been doing well since last visit and denies any complaints or concerns.     86 year old female  follow-up sarcoidosis associated with severe chronic cough right middle lobe syndrome and associated ocular sarcoidosis. Previously on methotrexate as of 2018 but low-dose prednisone currently through 2019. LAsT CXR Jan 2018 - clear   Follow-up chronic cough related to the above. She is now off methotrexate for over a year. Her alopecia has resolved. Her hair is back. She feels happy. She is on 5 mg prednisone a day. She feels well. Cough is minimal to nonexistent. She has ophthalmology appointment pending. I thought the current prednisone is because of her ocular sarcoid but she thinks it might be because of pulmonary and ocular sarcoid. She is willing to taper her prednisone down to 4 mg per day. Her husband is here with her as always. She is up-to-date with flu shot.She feels well without any respiratory symptoms of fatigue. No new medical problems or ER visits   OV 05/25/2018  Subjective:  Patient ID: [name], female , DOB: January 16, 1931 , age 49 y.o. , MRN: [id number] , ADDRESS: [address] [place] [facility] [address]   05/25/2018 -   Chief Complaint  Patient presents with  . Follow-up    Doing well at this time.     HPI [name] Page 86 y.o. -     OV 05/25/2018  Chief Complaint  Patient presents with  . Follow-up    Doing well at this time.     86 year old female follow-up sarcoidosis (bx proven LUL dec 2014 at [facility]) associated with severe chronic cough right middle lobe syndrome and associated ocular sarcoidosis. Previously on methotrexate as of 2018 but low-dose prednisone currently through 2019. LAsT CXR Jan 2018 - clear   Last seen in April 2019.  At that time she was doing asymptomatic.  She currently is asymptomatic but this is a quicker than expected follow-up.  This is because she went to her primary care physician and she talked about her right middle lobe syndrome.  Therefore a chest x-ray was done and this is  not normal.  Therefore a CT was recommended.  She  then called my office.  We decided to get a CT chest and see her today.  She and her husband insist that she is still asymptomatic.  She is doing well on 4 mg prednisone.  She is exercising regularly.  No cough, no wheeze, no hemoptysis no sputum.  However CT scan of the chest October 2019 when compared to 2011 is significantly worse with worsening infiltrates and right middle lobes collapse and also beginning stages of left upper lobe collapse.  Most of her decline seem to happen in the early years of the diagnosis of sarcoidosis between 2011 and 2015.  In 2014 she did have a CT scan of the chest at [facility].  I do not have the images to compare but it appears that most of the report from 2014 and the current 2019 report might just be the same or perhaps the infiltrates are worse.  It is unclear.  The best comparison might be chest x-ray to chest x-ray because the most recent chest x-ray was 2018.  Also pulmonary function testing is been a while since we did one.    CT dec 2014 at [place]  - unable to see image - local comparison [facility] 2012, 2014 CT chest 05/21/2018 at [facility] - comparison 2011[facility]  There is occlusion of the right middle lobe bronchus with atelectasis of the majority of the right middle lobe. There is also occlusion of the left upper lobe bronchus proximally with associated soft tissue, increased from prior. There is diffuse mosaic attenuation which likely represents air trapping.  Impression:  1. New 5 mm left upper lobe pulmonary nodule. 6-12 month followup is recommended. Other pulmonary nodules are stable.  2. Persistent occlusion of the right middle lobe bronchus proximally with near complete right middle lobe atelectasis. There is occlusion of the left upper lobe bronchus which is slightly increased from the prior study with adjacent soft tissue. Attention on followup is recommended.  Electronically Signed [place]:GURKYHCW Milinda Pointer, MD Electronically Signed on:07/19/2013  1:29 PM Mediastinum/Nodes: No mediastinal lymphadenopathy. Small mediastinal lymph nodes have subtle mineralization evident. Irregular Soft tissue fullness in the hilar regions is similar to prior scan. The esophagus has normal imaging features. There is no axillary lymphadenopathy.  Lungs/Pleura: The central tracheobronchial airways are patent. 7 mm left upper lobe pulmonary nodule is stable in the interval. Interval development of numerous, right greater than left, irregular bilateral pulmonary nodules. There is diffuse Perilymphatic thickening and nodularity, right greater than left, progressive in the interval with nodular thickening of the right major fissure progressive since prior study. These changes are associated with complete collapse of the right middle lobe. No pleural effusion  IMPRESSION: 1. Interval progression of bilateral perilymphatic thickening and nodularity, right greater than left. Imaging features are compatible with the patient's known history of sarcoidosis. 2. Interval development of complete collapse right middle lobe. Presumably also secondary to sarcoidosis and hilar involvement, but close follow-up recommended to exclude central obstructing lesion un related to sarcoidosis. 3.  Aortic Atherosclerois (ICD10-170.0)   Electronically Signed   By: Misty Stanley M.D.   On: 05/21/2018 09:54     OV 08/24/2018  Subjective:  Patient ID: [name], female , DOB: Aug 24, 1930 , age 21 y.o. , MRN: [id number] , ADDRESS: [address] [place] [facility] [address]   08/24/2018 -   Chief Complaint  Patient presents with  . Follow-up    PFT performed 08/23/2018.  Pt states  she has been doing well since last visit and denies any complaints.     HPI [name] Page 86 y.o. -+ returns for follow-up of her sarcoidosis with cough and middle lobe syndrome.  She is now on 5 mg prednisone.  She increase it from 4 mg prednisone because of cough.  With this the cough is  well controlled.  The issue more recently is that in 2019 she had a CT chest that shows progression of her sarcoid compared to 2011.  However in the interim she has had CT scans at [facility] in 2012 and 2014.  Ideally we would need to make a comparison to the most recent CT scan it appears at least based on reports the CT scans at [facility] in 2014 and [place] in 2019 might be similar.  However it is challenging to get radiology discs from outside institutions.  Therefore, we decided to look for progression with a pulmonary function test today which is essentially normal except for mild reduction in DLCO.  Incidentally, we try to get her chest x-ray image from primary care physician locally and so far we have not been able to do that.  Currently she is is also asymptomatic.  She is asymptomatic on 5 mg prednisone.   Results for KLOVER, PRIESTLY (MRN [id number]) as of 08/24/2018 13:53  Ref. Range 08/23/2018 09:37  FEV1-Pre Latest Units: L 2.00  FEV1-%Pred-Pre Latest Units: % 127  Pre FEV1/FVC ratio Latest Units: % 72  Results for LAPARIS, DURRETT (MRN [id number]) as of 08/24/2018 13:53  Ref. Range 08/23/2018 09:37  FEV1-Pre Latest Units: L 2.00  FEV1-%Pred-Pre Latest Units: % 127  Pre FEV1/FVC ratio Latest Units: % 72    OV 05/26/2019  Subjective:  Patient ID: [name], female , DOB: 23-Apr-1931 , age 77 y.o. , MRN: [id number] , ADDRESS: [address] [place] [facility] [address]   05/26/2019 -   Chief Complaint  Patient presents with  . Follow-up    Pt states her breathing is doing well. Pt states her cough is mild. Pt states her cough is unchanged since last OV in 08/2018. Pt CP/tightness, f/c/s.    Follow-up sarcoidosis pulmonary with ocular sarcoid right middle lobe syndrome and chronic cough.  Previously on methotrexate.  Currently on prednisone 5 mg/day.  HPI [name] Page 86 y.o. -last seen January 2020.  Since then she has been social distancing.  She and her husband  are doing well.  She is currently on prednisone 5 mg/day and has very minimal cough.  RSI cough score is only 2 although she thinks maybe the cough is increased compared to last visit but the subjective score shows the reverse.  She has no other respiratory issues.  Specifically denies any wheezing or shortness of breath or hemoptysis or weight loss or night sweats or chills.  She swims and exercises.  She is up-to-date with her flu shot.  We specifically discussed about the fact that October 2019 CT scan of the chest showed progression compared to many years ago although we agree that this progression is not really progression if one were to compare 2012 and 2014 CT scans of the chest at [facility].  In other words the progression should have happened prior to 2012 or prior to 2014.  However we have been unable to get the CT scan CD-ROM from [facility]  She is having a CT angios neck coming up with Dr. Floyde Parkins of neurology/CT scan will be  done at [place].  She is interested in getting a CT scan of the chest if we can coincide the appointments.  She understands that if her sarcoidosis shows progression that she will not be interested in methotrexate or other immunomodulators.  She understands the risk of radiation.  However she is willing to undergo the CT scan to have a sense of a progression versus stability.        ROS - per HPI     OV 12/08/2019  Subjective:  Patient ID: [name], female , DOB: [birth year] , age 59 y.o. 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  Previously on methotrexate.  Currently on prednisone 5 mg/day.  12/08/2019 -   Chief Complaint  Patient presents with  . Follow-up    No complaints of SOB/ coughing or whezzing      HPI [name] Page 86 y.o. -returns for follow-up for right middle lobe syndrome with  chronic cough.  Currently on prednisone 5 mg/day.  Last fall when she visited with me we had a CT scan of the chest that is shown improvement/stability on the report.  Currently she is without any cough.  She continues on 5 mg prednisone per day.  She does not want to lower the dose of the prednisone.  She is happy with this.  She also wants to continue with her fluticasone.  She wants refills of this.  She is up-to-date with the Covid vaccine.  She is going to have a jaw MRI because of jaw pain but otherwise is doing well.    ROS - per HPI  IMPRESSION: 1. Pulmonary parenchymal sarcoidosis, primarily improved. The only area of progression is in the central right lower lobe. 2. Slight improved appearance of the right middle lobe, with decrease in collapse/consolidation and airway compression. 3. No thoracic adenopathy. 4. Pulmonary artery enlargement suggests pulmonary arterial hypertension.   Electronically Signed   By: Abigail Miyamoto M.D.   On: 06/01/2019 14:58    OV 07/25/2020  Subjective:  Patient ID: [name], female , DOB: [birth year] , age 86 y.o. , MRN: [id number] , ADDRESS: [address] [place] [address] PCP Burnard Bunting, MD Patient Care Team: Burnard Bunting, MD as PCP - General (Internal Medicine)  This Provider for this visit: Treatment Team:  Attending Provider: Brand Males, MD    07/25/2020 -   Chief Complaint  Patient presents with  . Follow-up    Patient has been having trouble with heartburn and acid reflux, notices it more during the day when she is active. Prednisone change has helped.   Sarcoidosis with right middle lobe syndrome and chronic cough Has optic sarcoid as well  HPI [name] Page 86 y.o. -returns for follow-up.  Last seen in April 2021.  Last chest x-ray was in 2018 last CT scan of the chest was in 2020 with some improvement.  In April 2021 we reduced her prednisone to 4 mg/day but she called back saying there was  a return in cough so she went back up to prednisone 5 mg/day and this showed improvement in cough.  Currently cough is well controlled.  However in the last few months but subsequent increasing her prednisone back to her original baseline of 5 mg/day she is experiencing new heartburn with certain foods and bending over.  It is random.  It is not exertional.  She reportedly saw Dr. Acie Fredrickson sometime earlier this year ago in the last 1 to 2 years and was cleared from her cardiac standpoint.  Pepcid relieves this symptom.  There is no weight loss.     Dr Lorenza Cambridge Reflux Symptom Index (> 13-15 suggestive of LPR cough) Dec 2016 10/18/2015  04/22/2016  10/28/2016  01/13/2017  05/26/2019   Hoarseness of problem with voice 2 3 3 1 1  0  Clearing  Of Throat 3 2 1 1  0 1  Excess throat mucus or feeling of post nasal drip 1 1 0 1 0 0  Difficulty swallowing food, liquid or tablets 2 1 0 1 0 0  Cough after eating or lying down 3 0 0 0 0 0  Breathing difficulties or choking episodes 1 0 0 0 2 0  Troublesome or annoying cough 3 1 0 0 0 1  Sensation of something sticking in throat or lump in throat 0 1 0 0 0 0  Heartburn, chest pain, indigestion, or stomach acid coming up 3 1 2 1 1  0  TOTAL 18 10 6 5 3 2     ROS - per HPI     has a past medical history of Alopecia (10/28/2016), Arthritis, Cervical spondylosis, Chest pain (07/28/2008), Chronic cough (07/12/2014), Collapsed lung ( 64/33), Complication of anesthesia, Cough (07/30/2010), Cystocele (05/23/2015), Cystocele, midline (05/02/2013), DEEP VEIN THROMBOSIS/PHLEBITIS (07/30/2010), Dislocation closed, shoulder (7/14), DVT (deep venous thrombosis) ([place]) (2006), Encounter for therapeutic drug monitoring (10/03/2014), GERD (gastroesophageal reflux disease), Headache, High cholesterol, History of recurrent UTIs (09/24/2011), HYPERLIPIDEMIA (07/30/2010), Hypertension, HYPERTENSION (07/30/2010), Hypothyroidism, Nasal itching (01/30/2014), Osteopenia (05/23/2015), Peripheral  vascular disease ([place]) (2006), Pleuritic pain (12/17/2015), PONV (postoperative nausea and vomiting), Primary localized osteoarthritis of right hip (08/26/2016), Primary osteoarthritis of right hip (08/26/2016), Pulmonary collapse (07/30/2010), PULMONARY NODULE (07/30/2010), Rash and nonspecific skin eruption (01/02/2015), 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   reports that she has never smoked. She has never used smokeless tobacco.  Past Surgical History:  Procedure Laterality Date  . EYE SURGERY    . FOOT SURGERY Left 06/2005   bunion hammer toe  . HEMORRHOID SURGERY  12/1972  . INTRAOCULAR LENS INSERTION     right eye 10 /16 and left 06/16/2005  . JOINT REPLACEMENT    . SKIN BIOPSY  01/2007   basal cell carcinoma removed from right lower leg   . TOTAL HIP ARTHROPLASTY  06/2007   left  . TOTAL HIP ARTHROPLASTY Right 08/26/2016   Procedure: TOTAL HIP ARTHROPLASTY ANTERIOR APPROACH;  Surgeon: Melrose Nakayama, MD;  Location: [facility];  Service: Orthopedics;  Laterality: Right;  . TOTAL KNEE ARTHROPLASTY Right 10/2007   right    Allergies  Allergen Reactions  . Methotrexate Derivatives     Mild alopecia     Immunization History  Administered Date(s) Administered  . DTaP 12/10/1995  . Hepatitis A 12/10/1999  . Influenza Split 04/11/2013, 04/11/2014  . Influenza Whole 04/15/2010  . Influenza, High Dose Seasonal PF 04/17/2018, 04/21/2019, 05/14/2020  . Influenza,inj,Quad PF,6+ Mos 05/07/2015, 04/11/2016  . Influenza-Unspecified 05/09/2017  . Moderna SARS-COV2 Booster Vaccination 06/13/2020  . Moderna Sars-Covid-2 Vaccination 08/23/2019, 09/20/2019  . Pneumococcal Conjugate-13 04/28/2017  . Pneumococcal Polysaccharide-23 05/13/2004  . Pneumococcal-Unspecified 08/11/2010, 03/11/2014  . Td 02/08/2002  . Tdap 08/11/2012  . Zoster 06/12/2007  . Zoster Recombinat (Shingrix)  12/30/2019, 02/29/2020    Family History  Problem Relation Age of Onset  . Diabetes  Mother   . Heart failure Mother   . Hypertension Father   . Diabetes Father   . Heart disease Father   . Heart attack Father      Current Outpatient Medications:  .  amLODipine (NORVASC) 10 MG tablet, Take 10 mg by mouth daily., Disp: , Rfl:  .  ascorbic acid (VITAMIN C) 1000 MG tablet, Take 500 mg by mouth daily., Disp: , Rfl:  .  azelastine (OPTIVAR) 0.05 % ophthalmic solution, Place 1 drop into both eyes 2 (two) times daily. , Disp: , Rfl:  .  fluticasone (FLONASE) 50 MCG/ACT nasal spray, Place 2 sprays into both nostrils daily., Disp: 48 g, Rfl: 5 .  gemfibrozil (LOPID) 600 MG tablet, Take 600 mg by mouth 2 (two) times daily before a meal., Disp: , Rfl:  .  levothyroxine (SYNTHROID, LEVOTHROID) 25 MCG tablet, Take 25 mcg by mouth daily. , Disp: , Rfl:  .  LORazepam (ATIVAN) 1 MG tablet, Take 0.5 mg by mouth every 8 (eight) hours as needed. As needed, Disp: , Rfl:  .  losartan (COZAAR) 50 MG tablet, Take 50 mg by mouth daily., Disp: , Rfl:  .  Multiple Vitamin (MULTIVITAMIN) capsule, Take 1 capsule by mouth daily., Disp: , Rfl:  .  polyethylene glycol powder (GLYCOLAX/MIRALAX) 17 GM/SCOOP powder, Take 17 g by mouth daily., Disp: , Rfl:  .  predniSONE (DELTASONE) 1 MG tablet, Take 4 tablets (4 mg total) by mouth daily with breakfast. (Patient taking differently: Take 5 mg by mouth daily with breakfast.), Disp: 120 tablet, Rfl: 6      Objective:   Vitals:   07/25/20 0916  BP: 140/64  Pulse: 71  Temp: (!) 97.2 F (36.2 C)  TempSrc: Temporal  SpO2: 98%  Weight: 131 lb 12.8 oz (59.8 kg)  Height: 5\' 2"  (1.575 m)    Estimated body mass index is 24.11 kg/m as calculated from the following:   Height as of this encounter: 5\' 2"  (1.575 m).   Weight as of this encounter: 131 lb 12.8 oz (59.8 kg).  @WEIGHTCHANGE @  [employer]   07/25/20 0916  Weight: 131 lb 12.8 oz (59.8 kg)     Physical  Exam  General: No distress. Looks well Neuro: Alert and Oriented x 3. GCS 15. Speech normal Psych: Pleasant Resp:  Barrel Chest - no.  Wheeze - no, Crackles - no, No overt respiratory distress CVS: Normal heart sounds. Murmurs - no Ext: Stigmata of Connective Tissue Disease - no HEENT: Normal upper airway. PEERL +. No post nasal drip        Assessment:       ICD-10-CM   1. 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 Chronic cough  R05.3   4. Heartburn  R12        Plan:     Patient Instructions     ICD-10-CM   1. 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 Chronic cough  R05     It appears that prednisone 5 mg/day history minimum required dose to give you good symptom control.  It appears at 4 mg/day of prednisone is resulting in return of cough  Last chest x-ray was in 2018 and last CT scan of the chest was in 2021  Currently doing well on prednisone 5 mg/day   Plan  -Continue prednisone 5 mg/day -take 30-day supply or 90-day supply as you see fit with  refills for a year -Continue generic fluticasone nasal spray 2 squirts daily - CXR 2 view 07/25/2020   Heartburn  -This might or might not be related to prednisone 5 mg/day  Plan -Take over-the-counter Pepcid -Further work-up through primary care physician and make sure the cardiac issues are all stable and other basis for "heartburn" are ruled out  Follow-up -9 months for clinical follow-up; sooner if needed     SIGNATURE    Dr. Brand Males, M.D., F.C.C.P,  Pulmonary and Critical Care Medicine Staff Physician, [place] Director - Interstitial Lung Disease  Program  [facility] [place], [place], [address]  Pager: [phone], If no answer or between  15:00h - 7:00h: call [phone] Telephone: [phone]  9:43 AM 07/25/2020

## 2020-07-25 NOTE — Patient Instructions (Addendum)
ICD-10-CM   1. 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 Chronic cough  R05     It appears that prednisone 5 mg/day history minimum required dose to give you good symptom control.  It appears at 4 mg/day of prednisone is resulting in return of cough  Last chest x-ray was in 2018 and last CT scan of the chest was in 2021  Currently doing well on prednisone 5 mg/day   Plan  -Continue prednisone 5 mg/day -take 30-day supply or 90-day supply as you see fit with refills for a year -Continue generic fluticasone nasal spray 2 squirts daily - CXR 2 view 07/25/2020   Heartburn  -This might or might not be related to prednisone 5 mg/day  Plan -Take over-the-counter Pepcid -Further work-up through primary care physician and make sure the cardiac issues are all stable and other basis for "heartburn" are ruled out  Follow-up -9 months for clinical follow-up; sooner if needed

## 2020-07-25 NOTE — Addendum Note (Signed)
Addended by: Lia Foyer R on: 07/25/2020 09:45 AM   Modules accepted: Orders

## 2020-07-30 ENCOUNTER — Telehealth: Payer: Self-pay | Admitting: Internal Medicine

## 2020-07-30 ENCOUNTER — Other Ambulatory Visit: Payer: Self-pay | Admitting: Internal Medicine

## 2020-07-30 MED ORDER — FLUTICASONE PROPIONATE 50 MCG/ACT NA SUSP: 2.0000 | Freq: Every day | NASAL | 3 refills | Status: DC

## 2020-07-30 NOTE — Telephone Encounter (Signed)
Spoke with the pt and refilled flonase  Nothing further needed

## 2020-08-05 NOTE — Progress Notes (Signed)
[name] Page - please let patient know  Cxr in my visualization interpertation is clear. RML collapse 2011-2016 is no longer seen on CXR. Official report is below but to me cxr is reassuring. Follow plan from OV 07/25/20   Xxxxxxxxx   IMPRESSION: Apparent pulmonary arterial hypertension without cardiomegaly. No adenopathy appreciable by radiography. No edema or airspace opacity. Small calcified granuloma left upper lobe anteriorly. 7 mm nodular opacity left upper lobe, stable from prior CT.  Aortic Atherosclerosis (ICD10-I70.0).   Electronically Signed   By: Lowella Grip III M.D.   On: 07/25/2020 10:09

## 2020-08-07 ENCOUNTER — Telehealth: Payer: Self-pay | Admitting: Internal Medicine

## 2020-08-07 NOTE — Telephone Encounter (Signed)
[name], MD  Estill Llerena, Farley Ly, CMA Irving Burton - please let patient know   Cxr in my visualization interpertation is clear. RML collapse 2011-2016 is no longer seen on CXR. Official report is below but to me cxr is reassuring. Follow plan from OV 07/25/20    Xxxxxxxxx    IMPRESSION:  Apparent pulmonary arterial hypertension without cardiomegaly. No  adenopathy appreciable by radiography. No edema or airspace opacity.  Small calcified granuloma left upper lobe anteriorly. 7 mm nodular  opacity left upper lobe, stable from prior CT.    Aortic Atherosclerosis (ICD10-I70.0).    Called and spoke with pt letting her know the info stated by MR and she verbalized understanding. Nothing further needed.

## 2020-08-11 HISTORY — PX: BACK SURGERY: SHX140

## 2020-11-27 ENCOUNTER — Telehealth: Payer: Self-pay | Admitting: Internal Medicine

## 2020-11-27 NOTE — Telephone Encounter (Signed)
atc pt, no answer and vm full. Wcb.

## 2020-11-28 ENCOUNTER — Other Ambulatory Visit: Payer: Self-pay | Admitting: Internal Medicine

## 2020-11-28 MED ORDER — PREDNISONE 2 MG PO TBEC: DELAYED_RELEASE_TABLET | ORAL | 0 refills | Status: DC

## 2020-11-28 NOTE — Telephone Encounter (Signed)
Okay to reduce prednisone to 4 mg daily

## 2020-11-28 NOTE — Telephone Encounter (Signed)
Spoke to patient, who is requesting to decrease prednisone to 4mg  daily. She is doing well and denies any sx at this time. Preferred pharmacy is [facility].  Patient is requesting a call back after 12p,  Dr. Chase Caller, please advise. thanks

## 2020-11-28 NOTE — Telephone Encounter (Signed)
Changes Requested   RAYOS 2 MG TBEC       Changed from: predniSONE 2 MG TBEC   Sig: N/A   Disp:  60 tablet (Pharmacy requested: 60 each)  Refills:  0   Start: 11/28/2020   Class: Normal   Last ordered: Today by Brand Males, MD    Rx #: [id number]   Pharmacy comment: Change Requested for Formulary Compliance THIS IS NOT AVAILABLE FROM OUR [facility]. PLEASE CHANGE TO GENERIC PREDNISONE 1MG  AND ADJUST ACCORDINGLY. THIS WILL ALSO SAVE PATIENT $$$      MR, please see above message from pt's pharmacy on the pred 2mg  Rx that was sent in and advise. On if you are okay approving the change.

## 2020-11-28 NOTE — Telephone Encounter (Signed)
rx for the prednisone has been sent to the pharmacy for the pt.  I have attempted to call the pt but the VM is full.  Will try back later.

## 2020-12-13 ENCOUNTER — Encounter (HOSPITAL_BASED_OUTPATIENT_CLINIC_OR_DEPARTMENT_OTHER): Payer: Self-pay | Admitting: *Deleted

## 2020-12-13 ENCOUNTER — Other Ambulatory Visit: Payer: Self-pay

## 2020-12-13 ENCOUNTER — Emergency Department (HOSPITAL_BASED_OUTPATIENT_CLINIC_OR_DEPARTMENT_OTHER): Payer: Medicare PPO

## 2020-12-13 ENCOUNTER — Emergency Department (HOSPITAL_BASED_OUTPATIENT_CLINIC_OR_DEPARTMENT_OTHER)
Admission: EM | Admit: 2020-12-13 | Discharge: 2020-12-13 | Disposition: A | Discharge status: Home / Self Care | Payer: Medicare PPO | Attending: Emergency Medicine | Admitting: Emergency Medicine

## 2020-12-13 DIAGNOSIS — Z79899 Other long term (current) drug therapy: Secondary | ICD-10-CM | POA: Insufficient documentation

## 2020-12-13 DIAGNOSIS — E039 Hypothyroidism, unspecified: Secondary | ICD-10-CM | POA: Insufficient documentation

## 2020-12-13 DIAGNOSIS — Z96643 Presence of artificial hip joint, bilateral: Secondary | ICD-10-CM | POA: Insufficient documentation

## 2020-12-13 DIAGNOSIS — I10 Essential (primary) hypertension: Secondary | ICD-10-CM | POA: Insufficient documentation

## 2020-12-13 DIAGNOSIS — M25511 Pain in right shoulder: Secondary | ICD-10-CM | POA: Insufficient documentation

## 2020-12-13 DIAGNOSIS — Z96651 Presence of right artificial knee joint: Secondary | ICD-10-CM | POA: Diagnosis not present

## 2020-12-13 MED ORDER — NAPROXEN 375 MG PO TABS
375.0000 mg | ORAL_TABLET | Freq: Two times a day (BID) | ORAL | 0 refills | Status: AC
Start: 2020-12-13 — End: 2020-12-20

## 2020-12-13 NOTE — Discharge Instructions (Addendum)
Please schedule an appointment with your orthopedist Dr. Alma Friendly for further follow-up.  The x-ray of your right shoulder today did not show any dislocation or fracture.

## 2020-12-13 NOTE — ED Triage Notes (Signed)
C/o sudden sharp pain in right shoulder today with movt

## 2020-12-13 NOTE — ED Provider Notes (Signed)
[facility] EMERGENCY DEPARTMENT Provider Note   CSN: [id number] Arrival date & time: 12/13/20  1626     History Chief Complaint  Patient presents with  . Shoulder Pain    [name] Page is a 87 y.o. female.  87 y.o female with a PMH of dislocation of the right shoulder, DVT, hypertension presents to the ED with a chief complaint of right shoulder pain x this evening. Patient reports [name] Page was with a group of people, when to suddenly pick up an object, describes the movement as lifting of the right arm, felt a " excruciating "pain to the right shoulder, and states that [name] Page felt the same sensation with the prior right shoulder dislocation.  [name] Page did have a close reduction by Dr. Alma Friendly of [facility] orthopedics in the past, [name] Page feels like there might be a chance of separation at this time. Pain is exacerbated with raising of the right arm.  Has not taken any medication for improvement in symptoms. No chest pain, trauma or other complaints.    The history is provided by the patient.  Shoulder Pain Location:  Shoulder Shoulder location:  R shoulder Injury: yes   Associated symptoms: no fever        Past Medical History:  Diagnosis Date  . Alopecia 10/28/2016  . Arthritis   . Cervical spondylosis    C3-4 AND C4-5  . Chest pain 07/28/2008   H/O, normal stress nuclear EF 78%  . Chronic cough 07/12/2014  . Collapsed lung  12/11   being treated at [facility] - in left lung  . Complication of anesthesia   . Cough 07/30/2010   Qualifier: Diagnosis of  By: Chase Caller MD, Murali    . Cystocele 05/23/2015  . Cystocele, midline 05/02/2013  . DEEP VEIN THROMBOSIS/PHLEBITIS 07/30/2010   Qualifier: History of  By: Harvest Dark CMA, Anderson Malta    . Dislocation closed, shoulder 7/14  . DVT (deep venous thrombosis) ([place]) 2006  . Encounter for therapeutic drug monitoring 10/03/2014  . GERD (gastroesophageal reflux disease)    occ  . Headache    hx  . High cholesterol   . History of recurrent  UTIs 09/24/2011  . HYPERLIPIDEMIA 07/30/2010   Qualifier: Diagnosis of  By: Harvest Dark CMA, Anderson Malta    . Hypertension   . HYPERTENSION 07/30/2010   Qualifier: Diagnosis of  By: Harvest Dark CMA, Anderson Malta    . Hypothyroidism   . Nasal itching 01/30/2014  . Osteopenia 05/23/2015  . Peripheral vascular disease ([place]) 2006   dvt's and 50 yrs ago  . Pleuritic pain 12/17/2015  . PONV (postoperative nausea and vomiting)   . Primary localized osteoarthritis of right hip 08/26/2016  . Primary osteoarthritis of right hip 08/26/2016  . Pulmonary collapse 07/30/2010   Qualifier: Diagnosis of  By: Chase Caller MD, Murali    . PULMONARY NODULE 07/30/2010   Qualifier: Diagnosis of  By: Chase Caller MD, Murali    . Rash and nonspecific skin eruption 01/02/2015  . 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 Sarcoidosis   . Sarcoidosis of lung ([place])   . Vaginal atrophy 09/24/2011  . Visual field defect nasal step 01/02/2015    Patient Active Problem List   Diagnosis Date Noted  . Vasovagal syncope 09/20/2019  . Alopecia 10/28/2016  . Primary localized osteoarthritis of right hip 08/26/2016  . Primary osteoarthritis of right hip 08/26/2016  . Pleuritic pain 12/17/2015  . Osteopenia 05/23/2015  . Cystocele 05/23/2015  . Rash and nonspecific skin eruption 01/02/2015  . Visual  field defect nasal step 01/02/2015  . Encounter for therapeutic drug monitoring 10/03/2014  . Chronic cough 07/12/2014  . Nasal itching 01/30/2014  . 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 Sarcoidosis 01/30/2014  . Cystocele, midline 05/02/2013  . Chest pain   . History of recurrent UTIs 09/24/2011  . Vaginal atrophy 09/24/2011  . HYPERLIPIDEMIA 07/30/2010  . HYPERTENSION 07/30/2010  . DEEP VEIN THROMBOSIS/PHLEBITIS 07/30/2010  . PULMONARY COLLAPSE 07/30/2010  . PULMONARY NODULE 07/30/2010  . COUGH 07/30/2010    Past Surgical History:  Procedure Laterality Date  . EYE SURGERY    . FOOT SURGERY Left 06/2005   bunion hammer toe  .  HEMORRHOID SURGERY  12/1972  . INTRAOCULAR LENS INSERTION     right eye 10 /16 and left 06/16/2005  . JOINT REPLACEMENT    . SKIN BIOPSY  01/2007   basal cell carcinoma removed from right lower leg   . TOTAL HIP ARTHROPLASTY  06/2007   left  . TOTAL HIP ARTHROPLASTY Right 08/26/2016   Procedure: TOTAL HIP ARTHROPLASTY ANTERIOR APPROACH;  Surgeon: Melrose Nakayama, MD;  Location: [facility];  Service: Orthopedics;  Laterality: Right;  . TOTAL KNEE ARTHROPLASTY Right 10/2007   right     OB History    Gravida  2   Para  2   Term  2   Preterm      AB      Living  2     SAB      IAB      Ectopic      Multiple      Live Births  2           Family History  Problem Relation Age of Onset  . Diabetes Mother   . Heart failure Mother   . Hypertension Father   . Diabetes Father   . Heart disease Father   . Heart attack Father     Social History   Tobacco Use  . Smoking status: Never Smoker  . Smokeless tobacco: Never Used  Vaping Use  . Vaping Use: Never used  Substance Use Topics  . Alcohol use: Yes    Comment: BEER/glass of wine A DAY  . Drug use: No    Home Medications Prior to Admission medications   Medication Sig Start Date End Date Taking? Authorizing Provider  amLODipine (NORVASC) 10 MG tablet Take 10 mg by mouth daily.    [provider]  ascorbic acid (VITAMIN C) 1000 MG tablet Take 500 mg by mouth daily. 01/29/11   [provider]  azelastine (OPTIVAR) 0.05 % ophthalmic solution Place 1 drop into both eyes 2 (two) times daily.     [provider]  fluticasone (FLONASE) 50 MCG/ACT nasal spray Place 2 sprays into both nostrils daily. 07/30/20   Brand Males, MD  gemfibrozil (LOPID) 600 MG tablet Take 600 mg by mouth 2 (two) times daily before a meal.    [provider]  levothyroxine (SYNTHROID, LEVOTHROID) 25 MCG tablet Take 25 mcg by mouth daily.     [provider]  LORazepam (ATIVAN) 1 MG tablet Take 0.5  mg by mouth every 8 (eight) hours as needed. As needed 05/19/19   [provider]  losartan (COZAAR) 50 MG tablet Take 50 mg by mouth daily.    [provider]  Multiple Vitamin (MULTIVITAMIN) capsule Take 1 capsule by mouth daily.    [provider]  polyethylene glycol powder (GLYCOLAX/MIRALAX) 17 GM/SCOOP powder Take 17 g  by mouth daily. 07/26/19   [provider]  predniSONE 2 MG TBEC Take 4 mg by mouth every morning 11/28/20   Brand Males, MD    Allergies    Methotrexate derivatives  Review of Systems   Review of Systems  Constitutional: Negative for fever.  Musculoskeletal: Positive for arthralgias.    Physical Exam Updated Vital Signs BP (!) 168/71 (BP Location: Left Arm)   Pulse 87   Temp 98.3 F (36.8 C) (Oral)   Resp 20   Ht 5\' 2"  (1.575 m)   Wt 59 kg   SpO2 98%   BMI 23.78 kg/m   Physical Exam Vitals and nursing note reviewed.  Constitutional:      Appearance: Normal appearance.  HENT:     Head: Normocephalic and atraumatic.     Nose: Nose normal.     Mouth/Throat:     Mouth: Mucous membranes are moist.  Pulmonary:     Effort: Pulmonary effort is normal.  Abdominal:     General: Abdomen is flat.  Musculoskeletal:        General: Tenderness present.     Right shoulder: Deformity and tenderness present. No bony tenderness or crepitus. Decreased range of motion. Normal strength. Normal pulse.     Cervical back: Normal range of motion and neck supple.     Comments: Pulses present with TTP along the right humeral neck. Exacerbated with 180 extension over the head.   Skin:    General: Skin is warm and dry.  Neurological:     Mental Status: [name] Page is alert and oriented to person, place, and time.     ED Results / Procedures / Treatments   Labs (all labs ordered are listed, but only abnormal results are displayed) Labs Reviewed - No data to display  EKG None  Radiology No results found.  Procedures Procedures    Medications Ordered in ED Medications - No data to display  ED Course  I have reviewed the triage vital signs and the nursing notes.  Pertinent labs & imaging results that were available during my care of the patient were reviewed by me and considered in my medical decision making (see chart for details).    MDM Rules/Calculators/A&P  Patient presents to the ED with sudden onset of excruciating pain to the right shoulder after attempting to lift up an object.  Reports a similar episode in the past when [name] Page had a right shoulder dislocation, this was reduced under sedation by Dr. Alma Friendly at [facility].  Reports feeling the pain around the same area, there is pain with palpation of the humeral head.  During primary evaluation patient is overall nontoxic, no distress.  Vitals are within normal limits.  Pulses are present and [name] Page has full range of motion of the right wrist, right elbow.  Limited range of motion to the right shoulder, pain is exacerbated with movement of her right arm above her head.  There is some obvious deformity on the bicep present with comparison to her left bicep.  We discussed x-ray imaging to rule out any dislocation versus subluxation.  Xray of her right shoulder showed: 1. No fracture or dislocation  2. Degenerative changes. High-riding appearance of humeral head,  consistent with rotator cuff disease   Ultrasound of her upper extremity showed: No evidence of acute DVT within the right upper extremity.    Nonvisualization of the right ulnar veins within the forearm,  possibly atretic or chronically thrombosed.   Patient discussed with Dr.  Kathrynn Humble who has also evaluated patient and agrees to plan and management.  Patient will go home on a short course of anti-inflammatories, [name] Page will also follow-up with Dr. Lucas Mallow in outpatient basis.  Return precautions discussed at length, patient stable for discharge.   Portions of this note were generated with  [employer]. Dictation errors may occur despite best attempts at proofreading.  Final Clinical Impression(s) / ED Diagnoses Final diagnoses:  Acute pain of right shoulder    Rx / DC Orders ED Discharge Orders    None       Janeece Fitting, PA-C 12/13/20 1941    Varney Biles, MD 12/14/20 [phone]

## 2020-12-13 NOTE — ED Notes (Signed)
States has shoulder separation since 2015.  Developed pain about 6 months ago progressively getting worse.  Good pulses and cap fill to right arm.

## 2021-02-12 DIAGNOSIS — S61511A Laceration without foreign body of right wrist, initial encounter: Secondary | ICD-10-CM | POA: Diagnosis not present

## 2021-03-11 DIAGNOSIS — M5136 Other intervertebral disc degeneration, lumbar region: Secondary | ICD-10-CM | POA: Diagnosis not present

## 2021-03-11 DIAGNOSIS — K219 Gastro-esophageal reflux disease without esophagitis: Secondary | ICD-10-CM | POA: Diagnosis not present

## 2021-03-11 DIAGNOSIS — I10 Essential (primary) hypertension: Secondary | ICD-10-CM | POA: Diagnosis not present

## 2021-03-20 ENCOUNTER — Other Ambulatory Visit: Payer: Self-pay | Admitting: Internal Medicine

## 2021-03-29 DIAGNOSIS — S32010A Wedge compression fracture of first lumbar vertebra, initial encounter for closed fracture: Secondary | ICD-10-CM | POA: Diagnosis not present

## 2021-03-29 DIAGNOSIS — M545 Low back pain, unspecified: Secondary | ICD-10-CM | POA: Diagnosis not present

## 2021-04-08 DIAGNOSIS — M545 Low back pain, unspecified: Secondary | ICD-10-CM | POA: Diagnosis not present

## 2021-04-11 ENCOUNTER — Ambulatory Visit: Payer: Medicare PPO | Admitting: Internal Medicine

## 2021-04-11 ENCOUNTER — Other Ambulatory Visit: Payer: Self-pay

## 2021-04-11 ENCOUNTER — Encounter: Payer: Self-pay | Admitting: Internal Medicine

## 2021-04-11 VITALS — BP 162/70 | HR 70 | Temp 97.8°F | Ht 61.0 in | Wt 132.2 lb

## 2021-04-11 DIAGNOSIS — J9819 Other pulmonary collapse: Secondary | ICD-10-CM

## 2021-04-11 DIAGNOSIS — D869 Sarcoidosis, unspecified: Secondary | ICD-10-CM

## 2021-04-11 DIAGNOSIS — R053 Chronic cough: Secondary | ICD-10-CM

## 2021-04-11 MED ORDER — PREDNISONE 1 MG PO TABS
3.0000 mg | ORAL_TABLET | Freq: Every day | ORAL | 3 refills | Status: DC
Start: 2021-04-11 — End: 2021-09-19

## 2021-04-11 NOTE — Progress Notes (Signed)
OV 08/24/2014  Chief Complaint  Patient presents with   Follow-up    Pt stated the gabapentin has not changed her breathing or cough. Pt c/o prod cough with white mucus. Pt denies SOB and CP/tightness.    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 Main symptom is severe chronic cough with high RSI score suggestive of irritable larynx syndrome that is associated as well  She always finds prednisone to help the cough but due to age and side effects despite good functional status she opts to take a low-dose of prednisone. Currently she is on 5 mg prednisone. In November 2015 due to RSI score of 27 for cough but decided to trial gabapentin to tackle the neurogenic component of cough. She reports now that the gabapentin has not helped her at all although objectively the RSI cough score has drop to 20. She is very frustrated. She and her husband are reluctant to have right middle lobectomy given her advanced age despite good functional status. There are no open to trying methotrexate to tackle the inflammation of sarcoidosis and as a steroid sparing agent. She also wants to come off gabapentin   OV 10/02/2014  Chief Complaint  Patient presents with   Follow-up    Pt states her breathing is doing well. Pt states her SOB has improved since last OV. Pt c/o mild cough. Pt denies CP/tightness. Pt stated being on methotrexate has significantly helped.     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 Main symptom is severe chronic cough with high RSI score suggestive of irritable larynx syndrome that is associated as well    Last visit in January 2016 she told me that gabapentin did not help her cough. So we stopped this. We started methotrexate for inflammation in her airway due to  sarcoid causing right middle lobe collapse. We left her on prednisone 5 mg per day and started her on  methotrexate 5 mg once a week. Also gave Hycodan cough syrup. She says that the Hycodan cough syrup did not help However, 2 weeks after starting her methotrexate she started noticing dramatic improvement in her cough. She hardly coughs now. She has significant improvement in her quality of life and she is extremely happy.  RSI cough score had dropped from 25 to 20 with gabapentin has now dropped to 5.  She is asking questions about how long to take methotrexate. And also review of the side effect profile. Of note I did notice that she is on ACE inhibitor that can potentially provoke all make her cough worse. She is not taking gabapentin anymore. There is no fever or chills. At this point in time she's not interested in Bactrim PCP prophylaxis which should be okay given low-dose of methotrexate and prednisone  Past, Family, Social reviewed: no change since last visit    [name] 01/02/2015  Chief Complaint  Patient presents with   Follow-up    Pt recently went to opthamologist and pts vision has decreased since last OV. Pt stated her breathing is unchanged since last OV. Pt c/o mild dry cough. Pt denies CP/tightness.     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 Main symptom is severe chronic cough with high RSI score suggestive of irritable larynx syndrome that is associated as well  Cough /Saarcoid / IRritiable LArynx/ RML syundrome - cough persists but is only mild and baseline.  No problems. Does not feel it impacts qualkity of life. She is off ace inhibitor now  ; forgot to mention this during med reconciliation. No asociated wheeze or dyspnea. Maintained on mehtotrxate '5mg'$  weekly, folic acid and prednisone '5mg'$  daily. No evidence of infection. Last LFT was 3 months ago and normal. Hycodan helping cough and wants refill  NEw issue - her eye doc Dr [name] Page called yesterday. PAtient has no complaints of vision but post capsulootmy few week ago and now  yesterday visual field deficit on Rt side is worse. Concern is ocular sarcoid is worse. They are referring her to [facility]. PAtient does not want step up Rx of sarcoid till all this is sorted out  New issue - noticed some bruising in left forearm yesterday sponatenous x 2 spots. New. Asytmptomatcic and last 1 weeks some punctate red lesion in distal 1/3 of leg. Associatd with gardening but denies poison ivy. Unchanged since insidious onset. No associatd fever.    OV 04/09/2015  Chief Complaint  Patient presents with   Follow-up    Pt denies cough, SOB, CP/tightness. Pt states her breathing is doing well. Pt states she had a recent rash on BIL lower extremities and saw derm and was given a topical ointment that helped. Per pt the derm thought the rash was not related to sarcoid.     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 Main symptom is severe chronic cough with high RSI score suggestive of irritable larynx syndrome that is associated as well   Cough /Saarcoid / IRritiable LArynx/ RML syundrome - cough is significantly improved. RSI cough score is only 14. She feels that the cough is at a stage where it is completely fine and she can handle things. She is maintained on methotrexate 5 mg weekly, folic acid and prednisone 5 mg daily. She did have some lowish white count after last visit and anemia but we held to the methotrexate and it seems to be stable. She also had a rash at the time of last visit she has seen dermatology and apparently the biopsies do not show this is due to sarcoid. I personally review this outside chart and confirmed that. Of note her last chest x-ray was November 2015 which showed continued right middle lobe collapse. The cough is controlled with the methotrexate and prednisone. She is no longer on ACE inhibitor even though our MAR reports that Also noted that she reports some mild weight gain with prednisone but she says she  will try dietary measures to shake this weight off. She does not want to cough to come and therefore she is content continue with the methotrexate and prednisone. In terms of her vision she is waiting for [facility] referral to [facility]mology in October 2016.    OV 07/16/2015  Chief Complaint  Patient presents with   Follow-up    Pt states her breathing is unchanged but her cough has worsened. Pt states her SOB is at her baseline. Pt states her cough is nonproductive and worse in the evening. Pt denies CP/tightness.     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 Main symptom is severe chronic cough with high RSI score suggestive of irritable larynx syndrome that is associated as well   Cough /Saarcoid / IRritiable LArynx/ RML syundrome -she now reports that her cough is worse for the last few months especially since last visit. RSI cough score is 18 and subjectively  she feels that cough is worse by 25%. Otherwise she is okay. Cough is associated with worsening postnasal drip. She is also asking for refills on a cough syrup methotrexate folic acid. She prefers to have 90 day refills. Despite cough worsened she is not too keen on gabapentin therapy and wants to try other simpler measures at this point in time. There is no fever or sputum production that is associated with the cough.  In terms of her ophthalmology issues she has been reassured apparently it is a left optic now that is problematic in this 50% gone. No active follow-up.   OV 10/18/2015   Chief Complaint  Patient presents with   Follow-up    Pt states her cough has significantly improved. Pt denies SOB and CP/tightness. Pt states overall she feels she is doing well.     Follow-up chronic cough associated with right middle lobe syndrome secondary to sarcoidosis and irritable larynx syndrome  Last visit December 2016. At the time cough that deteriorated. The significant amount  of postnasal drip. We instituted nasal steroids and with this cough is significantly improved and back to baseline. RSI cough score is 10 which is around her baseline. She feels immensely better. She is in a good phase of her health. There are no new issues. She is pending $24 and over-the-counter nasal steroid and we discussed about doing a generic prescription which might be cheaper. She wants refill postdated on her cough syrup. She wants 90 day refills on her methotrexate. She is happy with having a next follow-up in 6 months.   OV 04/22/2016  Chief Complaint  Patient presents with   Follow-up    Pt states her breathing is at baseline. Pt states she is doing well. Pt states has a rare cough, not needing cough syrup any longer. Pt denies CP/tightness and f/c/s.      follow-up chronic cough associated with right middle lobe syndrome secondary to sarcoidosis [also ocular sarcoid] and irritable larynx syndrome  Last visit was me was in March 2017. In the interim she has visited Designer, jewellery. She is on prednisone 5 mg per day and also methotrexate 5 mg once a week.Her cough is significantly improved. She had a chest x-ray May 2017 that I personally visualized shows significant improvement in her right middle lobe laps compared to 2011/2012. She's not on the needing cough syrup. She has right hip DJD now and apparently hip surgery might be contemplated. She is using nasal ster  oid. She does not do Bactrim prophylaxis. RSI cough score show significant improvement with score of 6 which is only mild cough.   OV 10/28/2016  Chief Complaint  Patient presents with   Follow-up    sarcoidosis, wants to discuss hair loss    Fu Chronic cough due to right middle lobe syndrome due to sarcoidosis with associated with ocular sarcoidosis. On methotrexate 5 mg once weekly associated with prednisone low dose 4 mg/5 mg   She continues to do well. Cough is improved 1 RSI cough score 5 is documented  below. In January 2008 and she had replacement and she had a clear chest x-ray that I personally visualized. The cough is only mild. She has a new problem in the last 6 months of mild alopecia that is persistent and stable. Not associated with any pain or radiation or aggravating or relieving factors. It is associated with methotrexate intake. She wants to know about taking Biotene supplements that was advised by the hairdresser. Husband is  wondering if methotrexate is the etiology for this. Methotrexate can cause less than 10% alopecia not otherwise specified.   OV 01/13/2017  Chief Complaint  Patient presents with   Follow-up    Pt states her breathing is baseline. Pt states she has weaned off the methotrexate. Pt states she has mild dry cough. Pt denies CP/tightness.     87 year old female follow-up sarcoidosis associated with severe chronic cough right middle lobe syndrome and associated ocular sarcoidosis. Previously on methotrexate but low-dose prednisone currently.  Last visit in March 2018 because of good control cough and new onset of alopecia has mild he stopped the methotrexate. After this she is here for follow-up. She says that her cough has not gotten worse in the absence of methotrexate. Of note methotrexate was the only agent to improve her cough a few years ago. Her alopecia is improving after stopping the methotrexate. She strongly believes the methotrexate as the cause of alopecia and is agreeable for methotrexate to be listed in her allergies. She is continuing 5 mg of redness on her day. She plans to continue this because of ocular sarcoidosis as well. Her weight is causing some easy bruising which she is accepting. Today RSI cough score is only 3 and shows good control of cough    OV 11/10/2017  Chief Complaint  Patient presents with   Follow-up    Pt states she has been doing well since last visit and denies any complaints or concerns.     87 year old female follow-up  sarcoidosis associated with severe chronic cough right middle lobe syndrome and associated ocular sarcoidosis. Previously on methotrexate as of 2018 but low-dose prednisone currently through 2019. LAsT CXR Jan 2018 - clear   Follow-up chronic cough related to the above. She is now off methotrexate for over a year. Her alopecia has resolved. Her hair is back. She feels happy. She is on 5 mg prednisone a day. She feels well. Cough is minimal to nonexistent. She has ophthalmology appointment pending. I thought the current prednisone is because of her ocular sarcoid but she thinks it might be because of pulmonary and ocular sarcoid. She is willing to taper her prednisone down to 4 mg per day. Her husband is here with her as always. She is up-to-date with flu shot.She feels well without any respiratory symptoms of fatigue. No new medical problems or ER visits   OV 05/25/2018  Subjective:  Patient ID: [name], female , DOB: 12/09/1930 , age 65 y.o. , MRN: [id number] , ADDRESS: [address] [place] [facility] [id number]   05/25/2018 -   Chief Complaint  Patient presents with   Follow-up    Doing well at this time.     HPI [name] Page 87 y.o. -     OV 05/25/2018  Chief Complaint  Patient presents with   Follow-up    Doing well at this time.     87 year old female follow-up sarcoidosis (bx proven LUL dec 2014 at [facility]) associated with severe chronic cough right middle lobe syndrome and associated ocular sarcoidosis. Previously on methotrexate as of 2018 but low-dose prednisone currently through 2019. LAsT CXR Jan 2018 - clear   Last seen in April 2019.  At that time she was doing asymptomatic.  She currently is asymptomatic but this is a quicker than expected follow-up.  This is because she went to her primary care physician and she talked about her right middle lobe syndrome.  Therefore a chest x-ray was done and this  is not normal.  Therefore a CT was recommended.  She then called  my office.  We decided to get a CT chest and see her today.  She and her husband insist that she is still asymptomatic.  She is doing well on 4 mg prednisone.  She is exercising regularly.  No cough, no wheeze, no hemoptysis no sputum.  However CT scan of the chest October 2019 when compared to 2011 is significantly worse with worsening infiltrates and right middle lobes collapse and also beginning stages of left upper lobe collapse.  Most of her decline seem to happen in the early years of the diagnosis of sarcoidosis between 2011 and 2015.  In 2014 she did have a CT scan of the chest at [facility].  I do not have the images to compare but it appears that most of the report from 2014 and the current 2019 report might just be the same or perhaps the infiltrates are worse.  It is unclear.  The best comparison might be chest x-ray to chest x-ray because the most recent chest x-ray was 2018.  Also pulmonary function testing is been a while since we did one.    CT dec 2014 at [facility]  - unable to see image - local comparison [facility] 2012, 2014 CT chest 05/21/2018 [facility] - [facility]  There is occlusion of the right middle lobe bronchus with atelectasis of the majority of the right middle lobe. There is also occlusion of the left upper lobe bronchus proximally with associated soft tissue, increased from prior. There is diffuse mosaic attenuation which likely represents air trapping.  Impression:  1. New 5 mm left upper lobe pulmonary nodule. 6-12 month followup is recommended. Other pulmonary nodules are stable.  2. Persistent occlusion of the right middle lobe bronchus proximally with near complete right middle lobe atelectasis. There is occlusion of the left upper lobe bronchus which is slightly increased from the prior study with adjacent soft tissue. Attention on followup is recommended.  Electronically Signed by:  Rocky Link, MD Electronically Signed on:  07/19/2013 1:29 PM  Mediastinum/Nodes: No mediastinal lymphadenopathy. Small mediastinal lymph nodes have subtle mineralization evident. Irregular Soft tissue fullness in the hilar regions is similar to prior scan. The esophagus has normal imaging features. There is no axillary lymphadenopathy.   Lungs/Pleura: The central tracheobronchial airways are patent. 7 mm left upper lobe pulmonary nodule is stable in the interval. Interval development of numerous, right greater than left, irregular bilateral pulmonary nodules. There is diffuse Perilymphatic thickening and nodularity, right greater than left, progressive in the interval with nodular thickening of the right major fissure progressive since prior study. These changes are associated with complete collapse of the right middle lobe. No pleural effusion  IMPRESSION: 1. Interval progression of bilateral perilymphatic thickening and nodularity, right greater than left. Imaging features are compatible with the patient's known history of sarcoidosis. 2. Interval development of complete collapse right middle lobe. Presumably also secondary to sarcoidosis and hilar involvement, but close follow-up recommended to exclude central obstructing lesion un related to sarcoidosis. 3.  Aortic Atherosclerois (ICD10-170.0)     Electronically Signed   By: Misty Stanley M.D.   On: 05/21/2018 09:54     OV 08/24/2018  Subjective:  Patient ID: [name], female , DOB: Feb 03, 1931 , age 88 y.o. , MRN: [id number] , ADDRESS: [address] [place] [facility] [address]   08/24/2018 -   Chief Complaint  Patient presents with   Follow-up  PFT performed 08/23/2018.  Pt states she has been doing well since last visit and denies any complaints.     HPI [name] Page 87 y.o. -+ returns for follow-up of her sarcoidosis with cough and middle lobe syndrome.  She is now on 5 mg prednisone.  She increase it from 4 mg prednisone because of cough.  With this the cough is well  controlled.  The issue more recently is that in 2019 she had a CT chest that shows progression of her sarcoid compared to 2011.  However in the interim she has had CT scans at [facility] in 2012 and 2014.  Ideally we would need to make a comparison to the most recent CT scan it appears at least based on reports the CT scans at [facility] in 2014 and [place] in 2019 might be similar.  However it is challenging to get radiology discs from outside institutions.  Therefore, we decided to look for progression with a pulmonary function test today which is essentially normal except for mild reduction in DLCO.  Incidentally, we try to get her chest x-ray image from primary care physician locally and so far we have not been able to do that.  Currently she is is also asymptomatic.  She is asymptomatic on 5 mg prednisone.   Results for LOWANNA, SANCHEZLOPEZ (MRN [id number]) as of 08/24/2018 13:53  Ref. Range 08/23/2018 09:37  FEV1-Pre Latest Units: L 2.00  FEV1-%Pred-Pre Latest Units: % 127  Pre FEV1/FVC ratio Latest Units: % 72  Results for ALISIA, LITCHFIELD (MRN [id number]) as of 08/24/2018 13:53  Ref. Range 08/23/2018 09:37  FEV1-Pre Latest Units: L 2.00  FEV1-%Pred-Pre Latest Units: % 127  Pre FEV1/FVC ratio Latest Units: % 72    OV 05/26/2019  Subjective:  Patient ID: [name], female , DOB: [birth year] , age 59 y.o. , MRN: [id number] , ADDRESS: [address] [place] [facility] [address]   05/26/2019 -   Chief Complaint  Patient presents with   Follow-up    Pt states her breathing is doing well. Pt states her cough is mild. Pt states her cough is unchanged since last OV in 08/2018. Pt CP/tightness, f/c/s.    Follow-up sarcoidosis pulmonary with ocular sarcoid right middle lobe syndrome and chronic cough.  Previously on methotrexate.  Currently on prednisone 5 mg/day.  HPI ZARIANNA FROEHLICH 87 y.o. -last seen January 2020.  Since then she has been social distancing.  She and her husband are  doing well.  She is currently on prednisone 5 mg/day and has very minimal cough.  RSI cough score is only 2 although she thinks maybe the cough is increased compared to last visit but the subjective score shows the reverse.  She has no other respiratory issues.  Specifically denies any wheezing or shortness of breath or hemoptysis or weight loss or night sweats or chills.  She swims and exercises.  She is up-to-date with her flu shot.  We specifically discussed about the fact that October 2019 CT scan of the chest showed progression compared to many years ago although we agree that this progression is not really progression if one were to compare 2012 and 2014 CT scans of the chest at [facility].  In other words the progression should have happened prior to 2012 or prior to 2014.  However we have been unable to get the CT scan CD-ROM from [facility].  She is having a CT angios neck coming up with Dr. Lanny Hurst  [place] of neurology/CT scan will be done at [facility].  She is interested in getting a CT scan of the chest if we can coincide the appointments.  She understands that if her sarcoidosis shows progression that she will not be interested in methotrexate or other immunomodulators.  She understands the risk of radiation.  However she is willing to undergo the CT scan to have a sense of a progression versus stability.        ROS - per HPI     OV 12/08/2019  Subjective:  Patient ID: [name], female , DOB: 03-01-1931 , age 44 y.o. 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  Previously on methotrexate.  Currently on prednisone 5 mg/day.  12/08/2019 -   Chief Complaint  Patient presents with   Follow-up    No complaints of SOB/ coughing or whezzing      HPI [name] Page 87 y.o. -returns for follow-up for right middle lobe syndrome with chronic  cough.  Currently on prednisone 5 mg/day.  Last fall when she visited with me we had a CT scan of the chest that is shown improvement/stability on the report.  Currently she is without any cough.  She continues on 5 mg prednisone per day.  She does not want to lower the dose of the prednisone.  She is happy with this.  She also wants to continue with her fluticasone.  She wants refills of this.  She is up-to-date with the Covid vaccine.  She is going to have a jaw MRI because of jaw pain but otherwise is doing well.    ROS - per HPI  IMPRESSION: 1. Pulmonary parenchymal sarcoidosis, primarily improved. The only area of progression is in the central right lower lobe. 2. Slight improved appearance of the right middle lobe, with decrease in collapse/consolidation and airway compression. 3. No thoracic adenopathy. 4. Pulmonary artery enlargement suggests pulmonary arterial hypertension.     Electronically Signed   By: Abigail Miyamoto M.D.   On: 06/01/2019 14:58    OV 07/25/2020  Subjective:  Patient ID: [name], female , DOB: 06-26-1931 , age 40 y.o. , MRN: [id number] , ADDRESS: [address] [place] [id number] PCP Burnard Bunting, MD Patient Care Team: Burnard Bunting, MD as PCP - General (Internal Medicine)  This Provider for this visit: Treatment Team:  Attending Provider: Brand Males, MD    07/25/2020 -   Chief Complaint  Patient presents with   Follow-up    Patient has been having trouble with heartburn and acid reflux, notices it more during the day when she is active. Prednisone change has helped.   Sarcoidosis with right middle lobe syndrome and chronic cough Has optic sarcoid as well  HPI [name] Page 87 y.o. -returns for follow-up.  Last seen in April 2021.  Last chest x-ray was in 2018 last CT scan of the chest was in 2020 with some improvement.  In April 2021 we reduced her prednisone to 4 mg/day but she called back saying there was a return  in cough so she went back up to prednisone 5 mg/day and this showed improvement in cough.  Currently cough is well controlled.  However in the last few months but subsequent increasing her prednisone back to her original baseline of 5 mg/day she is experiencing new heartburn with certain foods and bending over.  It is random.  It is not exertional.  She reportedly saw Dr. Acie Fredrickson sometime earlier this year ago in the last 1 to 2 years and was cleared from her cardiac standpoint.  Pepcid relieves this symptom.  There is no weight loss.    ROS - per HPI    OV 04/11/2021  Subjective:  Patient ID: [name], female , DOB: 1930-09-02 , age 33 y.o. , MRN: [id number] , ADDRESS: [address] [place] [id number] PCP Burnard Bunting, MD Patient Care Team: Burnard Bunting, MD as PCP - General (Internal Medicine)  This Provider for this visit: Treatment Team:  Attending Provider: Brand Males, MD    04/11/2021 -   Chief Complaint  Patient presents with   Follow-up    Pt states she would like to talk about problems she having with prednisone.    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 -returns for follow-up.  Last seen 9 months ago.  At that point in time I thought we resolved we will do 5 mg/day of prednisone because at 4 mg/day of prednisone her cough is returning.  We did a chest x-ray and a right middle lobe syndrome collapse had resolved compared to many years earlier.  The radiologist did not specifically comment about it.  It was my Sports coach opinion.  It appears in the interim she has had progressive issues with the right shoulder and unable to abduct it..  She is also in the last month or 2 having issues with the back pain.  She has seen Dr. Rhona Raider for this.  She has had MRI and apparently she might have cement placed.  She has a brace and this is helping her.  Based on  all this she is really nervous about continuing prednisone.  She ideally would like to come off prednisone slowly.  At this point in time because she is doing well on 4 mg/day of prednisone she wants to reduce it to 3 mg/day of prednisone but ultimately if possible work towards completely quitting prednisone.  She states her cough is resolved.  She is not having any visual problems at this point.  Not having any skin problems.  Her current cough score is documented below.     Dr Lorenza Cambridge Reflux Symptom Index (> 13-15 suggestive of LPR cough) Dec 2016 10/18/2015  04/22/2016  10/28/2016  01/13/2017  05/26/2019  04/11/2021 PRED '4MG'$   Hoarseness of problem with voice '2 3 3 1 1 '$ 0 1  Clearing  Of Throat '3 2 1 1 '$ 0 1 1  Excess throat mucus or feeling of post nasal drip 1 1 0 1 0 0 1  Difficulty swallowing food, liquid or tablets 2 1 0 1 0 0 0  Cough after eating or lying down 3 0 0 0 0 0 0  Breathing difficulties or choking episodes 1 0 0 0 2 0 0  Troublesome or annoying cough 3 1 0 0 0 1 0  Sensation of something sticking in throat or lump in throat 0 1 0 0 0 0 0  Heartburn, chest pain, indigestion, or stomach acid coming up '3 1 2 1 1 '$ 0 1  TOTAL '18 10 6 5 3 2 4    '$ PFT  PFT Results Latest Ref Rng & Units 08/23/2018  FVC-Pre L 2.80  FVC-Predicted Pre % 130  Pre FEV1/FVC % % 72  FEV1-Pre L 2.00  FEV1-Predicted Pre % 127  DLCO uncorrected ml/min/mmHg 16.59  DLCO UNC% % 76  DLVA Predicted % 88   Narrative & Impression  CLINICAL DATA:  Sarcoidosis   EXAM: CHEST - 2 VIEW   COMPARISON:  Chest CT June 01, 2019   FINDINGS: No edema or airspace opacity evident. Noncalcified nodular opacity left upper lobe measuring 7 mm, seen on prior CT and stable. Calcified granuloma anteriorly, seen on lateral view. Chest CT indicates that this granuloma is in the left upper lobe. Heart size is within normal limits. There is prominence of the central pulmonary arteries with rapid peripheral tapering,  likely indicating a degree of pulmonary arterial hypertension. No adenopathy appreciable by radiography. There is mild degenerative change in the thoracic spine. There is aortic atherosclerosis.   IMPRESSION: Apparent pulmonary arterial hypertension without cardiomegaly. No adenopathy appreciable by radiography. No edema or airspace opacity. Small calcified granuloma left upper lobe anteriorly. 7 mm nodular opacity left upper lobe, stable from prior CT.   Aortic Atherosclerosis (ICD10-I70.0).     Electronically Signed   By: Lowella Grip III M.D.   On: 07/25/2020 10:09        has a past medical history of Alopecia (10/28/2016), Arthritis, Cervical spondylosis, Chest pain (07/28/2008), Chronic cough (07/12/2014), Collapsed lung ( [id number]), Complication of anesthesia, Cough (07/30/2010), Cystocele (05/23/2015), Cystocele, midline (05/02/2013), DEEP VEIN THROMBOSIS/PHLEBITIS (07/30/2010), Dislocation closed, shoulder (7/14), DVT (deep venous thrombosis) ([place]) (2006), Encounter for therapeutic drug monitoring (10/03/2014), GERD (gastroesophageal reflux disease), Headache, High cholesterol, History of recurrent UTIs (09/24/2011), HYPERLIPIDEMIA (07/30/2010), Hypertension, HYPERTENSION (07/30/2010), Hypothyroidism, Nasal itching (01/30/2014), Osteopenia (05/23/2015), Peripheral vascular disease ([place]) (2006), Pleuritic pain (12/17/2015), PONV (postoperative nausea and vomiting), Primary localized osteoarthritis of right hip (08/26/2016), Primary osteoarthritis of right hip (08/26/2016), Pulmonary collapse (07/30/2010), PULMONARY NODULE (07/30/2010), Rash and nonspecific skin eruption (01/02/2015), Right middle lobe syndrome (01/30/2014), Sarcoidosis, Sarcoidosis of lung ([place]), Vaginal atrophy (09/24/2011), and Visual field defect nasal step (01/02/2015).   reports that she has never smoked. She has never used smokeless tobacco.  Past Surgical History:  Procedure Laterality Date   EYE SURGERY     FOOT  SURGERY Left 06/2005   bunion hammer toe   HEMORRHOID SURGERY  12/1972   INTRAOCULAR LENS INSERTION     right eye 10 /16 and left 06/16/2005   JOINT REPLACEMENT     SKIN BIOPSY  01/2007   basal cell carcinoma removed from right lower leg    TOTAL HIP ARTHROPLASTY  06/2007   left   TOTAL HIP ARTHROPLASTY Right 08/26/2016   Procedure: TOTAL HIP ARTHROPLASTY ANTERIOR APPROACH;  Surgeon: Melrose Nakayama, MD;  Location: [facility];  Service: Orthopedics;  Laterality: Right;   TOTAL KNEE ARTHROPLASTY Right 10/2007   right    Allergies  Allergen Reactions   Methotrexate Derivatives     Mild alopecia     Immunization History  Administered Date(s) Administered   DTaP 12/10/1995   Hepatitis A 12/10/1999   Influenza Split 04/11/2013, 04/11/2014   Influenza Whole 04/15/2010   Influenza, High Dose Seasonal PF 04/17/2018, 04/21/2019, 05/14/2020   Influenza,inj,Quad PF,6+ Mos 05/07/2015, 04/11/2016   Influenza-Unspecified 05/09/2017   Moderna SARS-COV2 Booster Vaccination 06/13/2020   Moderna Sars-Covid-2 Vaccination 08/23/2019, 09/20/2019   Pneumococcal Conjugate-13 04/28/2017   Pneumococcal Polysaccharide-23 05/13/2004   Pneumococcal-Unspecified 08/11/2010, 03/11/2014   Td 02/08/2002   Tdap 08/11/2012   Zoster Recombinat (Shingrix) 12/30/2019, 02/29/2020   Zoster, Live 06/12/2007    Family History  Problem Relation Age of Onset   Diabetes Mother  Heart failure Mother    Hypertension Father    Diabetes Father    Heart disease Father    Heart attack Father      Current Outpatient Medications:    amLODipine (NORVASC) 10 MG tablet, Take 10 mg by mouth daily., Disp: , Rfl:    azelastine (OPTIVAR) 0.05 % ophthalmic solution, Place 1 drop into both eyes 2 (two) times daily. , Disp: , Rfl:    gemfibrozil (LOPID) 600 MG tablet, Take 600 mg by mouth 2 (two) times daily before a meal., Disp: , Rfl:    levothyroxine (SYNTHROID, LEVOTHROID) 25 MCG tablet, Take 25 mcg by mouth daily. , Disp: ,  Rfl:    LORazepam (ATIVAN) 1 MG tablet, Take 0.5 mg by mouth every 8 (eight) hours as needed. As needed, Disp: , Rfl:    losartan (COZAAR) 50 MG tablet, Take 50 mg by mouth daily., Disp: , Rfl:    Multiple Vitamin (MULTIVITAMIN) capsule, Take 1 capsule by mouth daily., Disp: , Rfl:    polyethylene glycol powder (GLYCOLAX/MIRALAX) 17 GM/SCOOP powder, Take 17 g by mouth daily., Disp: , Rfl:    predniSONE 2 MG TBEC, Take 4 mg by mouth every morning, Disp: 60 tablet, Rfl: 0      Objective:   Vitals:   04/11/21 1510  BP: (!) 162/70  Pulse: 70  Temp: 97.8 F (36.6 C)  TempSrc: Oral  SpO2: 99%  Weight: 132 lb 3.2 oz (60 kg)  Height: '5\' 1"'$  (1.549 m)    Estimated body mass index is 24.98 kg/m as calculated from the following:   Height as of this encounter: '5\' 1"'$  (1.549 m).   Weight as of this encounter: 132 lb 3.2 oz (60 kg).  '@WEIGHTCHANGE'$ @  Filed Weights   04/11/21 1510  Weight: 132 lb 3.2 oz (60 kg)     Physical ExamGeneral: No distress. LOOK SWELL Neuro: Alert and Oriented x 3. GCS 15. Speech normal Psych: Pleasant Resp:  Barrel Chest - NO.  Wheeze - NO, Crackles - NO, No overt respiratory distress CVS: Normal heart sounds. Murmurs - NO Ext: Stigmata of Connective Tissue Disease - NO. UNABLE TO ABDUCT SHOULDER HEENT: Normal upper airway. PEERL +. No post nasal drip HAS BACK BRACE        Assessment:       ICD-10-CM   1. Sarcoidosis  D86.9     2. Right middle lobe syndrome  J98.19     3. Chronic cough  R05.3          Plan:     Patient Instructions     ICD-10-CM   1. 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 Chronic cough  R05     Sarcoidosis appears stable based on the fact in my personal opinion December 2021 chest x-ray right middle lobe syndrome has resolved and currently on 4 mg/day of prednisone and not having any sarcoidosis symptoms  Given shoulder issues in the last few years and also back issue in the last month or 2  respect your desire to cut down on  prednisone even further  Noted that you have a desire to get shoulder surgery done for the right shoulder -likely low risk for pulmonary complications following shoulder surgery  Plan - Reduce prednisone to 3 mg/day - Chest x-ray in 2-3 months  Follow-up Return in 2-3 months but after chest x-ray  -Can reassess shoulder surgery need at the time of follow-up  SIGNATURE    Dr. Brand Males, M.D., F.C.C.P,  Pulmonary and Critical Care Medicine Staff Physician, [place] Director - Interstitial Lung Disease  Program  Pulmonary [place] at [place], [place], [address]  Pager: [phone], If no answer or between  15:00h - 7:00h: call [phone] Telephone: [phone]  3:55 PM 04/11/2021

## 2021-04-11 NOTE — Patient Instructions (Addendum)
ICD-10-CM   1. 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 Chronic cough  R05     Sarcoidosis appears stable based on the fact in my personal opinion December 2021 chest x-ray right middle lobe syndrome has resolved and currently on 4 mg/day of prednisone and not having any sarcoidosis symptoms  Given shoulder issues in the last few years and also back issue in the last month or 2 respect your desire to cut down on  prednisone even further  Noted that you have a desire to get shoulder surgery done for the right shoulder -likely low risk for pulmonary complications following shoulder surgery  Plan - Reduce prednisone to 3 mg/day - Chest x-ray in 2-3 months  Follow-up Return in 2-3 months but after chest x-ray  -Can reassess shoulder surgery need at the time of follow-up

## 2021-04-12 DIAGNOSIS — M545 Low back pain, unspecified: Secondary | ICD-10-CM | POA: Diagnosis not present

## 2021-05-17 DIAGNOSIS — M545 Low back pain, unspecified: Secondary | ICD-10-CM | POA: Diagnosis not present

## 2021-05-18 DIAGNOSIS — Z23 Encounter for immunization: Secondary | ICD-10-CM | POA: Diagnosis not present

## 2021-05-20 DIAGNOSIS — M545 Low back pain, unspecified: Secondary | ICD-10-CM | POA: Diagnosis not present

## 2021-05-20 DIAGNOSIS — I10 Essential (primary) hypertension: Secondary | ICD-10-CM | POA: Diagnosis not present

## 2021-05-20 DIAGNOSIS — E785 Hyperlipidemia, unspecified: Secondary | ICD-10-CM | POA: Diagnosis not present

## 2021-05-27 ENCOUNTER — Telehealth: Payer: Self-pay | Admitting: Internal Medicine

## 2021-05-27 ENCOUNTER — Other Ambulatory Visit: Payer: Self-pay | Admitting: Orthopedic Surgery

## 2021-05-27 DIAGNOSIS — M1990 Unspecified osteoarthritis, unspecified site: Secondary | ICD-10-CM | POA: Diagnosis not present

## 2021-05-27 DIAGNOSIS — M5136 Other intervertebral disc degeneration, lumbar region: Secondary | ICD-10-CM | POA: Diagnosis not present

## 2021-05-27 DIAGNOSIS — Z13820 Encounter for screening for osteoporosis: Secondary | ICD-10-CM | POA: Diagnosis not present

## 2021-05-27 DIAGNOSIS — Z1331 Encounter for screening for depression: Secondary | ICD-10-CM | POA: Diagnosis not present

## 2021-05-27 DIAGNOSIS — E785 Hyperlipidemia, unspecified: Secondary | ICD-10-CM | POA: Diagnosis not present

## 2021-05-27 DIAGNOSIS — I700 Atherosclerosis of aorta: Secondary | ICD-10-CM | POA: Diagnosis not present

## 2021-05-27 DIAGNOSIS — I10 Essential (primary) hypertension: Secondary | ICD-10-CM | POA: Diagnosis not present

## 2021-05-27 DIAGNOSIS — K219 Gastro-esophageal reflux disease without esophagitis: Secondary | ICD-10-CM | POA: Diagnosis not present

## 2021-05-27 DIAGNOSIS — Z0000 Encounter for general adult medical examination without abnormal findings: Secondary | ICD-10-CM | POA: Diagnosis not present

## 2021-05-27 NOTE — Telephone Encounter (Signed)
Spoke with the pt She is having back surgery on 10/27  She states that she thinks they want her to d/c pred prior to surgery but she is not sure  She is asking how long it would be safe for her to d/c pred and what she would start back after surgery  She has not received any instruction on this issue from her surgeon yet, but wanted MR's input  Please advise thanks

## 2021-05-28 DIAGNOSIS — R82998 Other abnormal findings in urine: Secondary | ICD-10-CM | POA: Diagnosis not present

## 2021-05-29 NOTE — Telephone Encounter (Signed)
I called to give the patient recommendations per provider and I had to leave a message to call back.

## 2021-05-29 NOTE — Telephone Encounter (Signed)
Typically with long term steroids - the surgeon/anestheia give stress dose steroids (higher dose) pre and post to avoid low bp. Aburptuly stopping long term steroids can increase adrenal crisis risk. Happy to talk to surgeon regarding rationale. Who is the surgeon?When is the surgery

## 2021-05-29 NOTE — Telephone Encounter (Signed)
Called and spoke with patient. She stated that the surgery is scheduled for 06/06/21. Dr. Phylliss Bob with [facility] will be the surgeon.

## 2021-06-03 NOTE — Progress Notes (Signed)
Surgical Instructions    Your procedure is scheduled on Thursday, October 27th.  Report to [facility] Main Entrance "A" at 11:30 A.M., then check in with the Admitting office.  Call this number if you have problems the morning of surgery:  [phone]   If you have any questions prior to your surgery date call [phone]: Open Monday-Friday 8am-4pm    Remember:  Do not eat after midnight the night before your surgery  You may drink clear liquids until 11:30 AM the morning of your surgery.   Clear liquids allowed are: Water, Non-Citrus Juices (without pulp), Carbonated Beverages, Clear Tea, Black Coffee ONLY (NO MILK, CREAM OR POWDERED CREAMER of any kind), and Gatorade  Please complete your PRE-SURGERY ENSURE that was provided to you by 11:30 AM the morning of surgery.  Please, if able, drink it in one setting. DO NOT SIP.     Take these medicines the morning of surgery with A SIP OF WATER Amlodipine (Norvasc) Levothyroxine (Synthroid) Lorazepam (Ativan) Pantoprazole (Protonix) Prednisone  Gemfibrozil (Lopid)  If needed: Azelastine eye drops Oxycodone-Acetaminophen (Percocet)   As of today, STOP taking any Aspirin (unless otherwise instructed by your surgeon) Aleve, Naproxen, Ibuprofen, Motrin, Advil, Goody's, BC's, all herbal medications, fish oil, and all vitamins.  DAY OF SURGERY:        Do not wear jewelry, makeup, or nail polish Do not wear lotions, powders, perfumes, or deodorant. Do not shave 48 hours prior to surgery.   Do not bring valuables to the hospital.             [facility] is not responsible for any belongings or valuables.  Do NOT Smoke (Tobacco/Vaping)  24 hours prior to your procedure  If you use a CPAP at night, you may bring your mask for your overnight stay.   Contacts, glasses, hearing aids, dentures or partials may not be worn into surgery, please bring cases for these belongings   For patients admitted to the hospital, discharge time will  be determined by your treatment team.   Patients discharged the day of surgery will not be allowed to drive home, and someone needs to stay with them for 24 hours.  NO VISITORS WILL BE ALLOWED IN PRE-OP WHERE PATIENTS ARE PREPPED FOR SURGERY.  ONLY 1 SUPPORT PERSON MAY BE PRESENT IN THE WAITING ROOM WHILE YOU ARE IN SURGERY.  IF YOU ARE TO BE ADMITTED, ONCE YOU ARE IN YOUR ROOM YOU WILL BE ALLOWED TWO (2) VISITORS. 1 (ONE) VISITOR MAY STAY OVERNIGHT BUT MUST ARRIVE TO THE ROOM BY 8pm.  Minor children may have two parents present. Special consideration for safety and communication needs will be reviewed on a case by case basis.  Special instructions:    Oral Hygiene is also important to reduce your risk of infection.  Remember - BRUSH YOUR TEETH THE MORNING OF SURGERY WITH YOUR REGULAR TOOTHPASTE   [facility]- Preparing For Surgery  Before surgery, you can play an important role. Because skin is not sterile, your skin needs to be as free of germs as possible. You can reduce the number of germs on your skin by washing with CHG (chlorahexidine gluconate) Soap before surgery.  CHG is an antiseptic cleaner which kills germs and bonds with the skin to continue killing germs even after washing.     Please do not use if you have an allergy to CHG or antibacterial soaps. If your skin becomes reddened/irritated stop using the CHG.  Do not shave (including legs  and underarms) for at least 48 hours prior to first CHG shower. It is OK to shave your face.  Please follow these instructions carefully.     Shower the NIGHT BEFORE SURGERY and the MORNING OF SURGERY with CHG Soap.   If you chose to wash your hair, wash your hair first as usual with your normal shampoo. After you shampoo, rinse your hair and body thoroughly to remove the shampoo.  Then [employer] and genitals (private parts) with your normal soap and rinse thoroughly to remove soap.  After that Use CHG Soap as you would any other liquid soap.  You can apply CHG directly to the skin and wash gently with a scrungie or a clean washcloth.   Apply the CHG Soap to your body ONLY FROM THE NECK DOWN.  Do not use on open wounds or open sores. Avoid contact with your eyes, ears, mouth and genitals (private parts). Wash Face and genitals (private parts)  with your normal soap.   Wash thoroughly, paying special attention to the area where your surgery will be performed.  Thoroughly rinse your body with warm water from the neck down.  DO NOT shower/wash with your normal soap after using and rinsing off the CHG Soap.  Pat yourself dry with a CLEAN TOWEL.  Wear CLEAN PAJAMAS to bed the night before surgery  Place CLEAN SHEETS on your bed the night before your surgery  DO NOT SLEEP WITH PETS.   Day of Surgery:  Take a shower with CHG soap. Wear Clean/Comfortable clothing the morning of surgery Do not apply any deodorants/lotions.   Remember to brush your teeth WITH YOUR REGULAR TOOTHPASTE.   Please read over the following fact sheets that you were given.

## 2021-06-04 ENCOUNTER — Other Ambulatory Visit: Payer: Self-pay

## 2021-06-04 ENCOUNTER — Encounter (HOSPITAL_COMMUNITY)
Admission: RE | Admit: 2021-06-04 | Discharge: 2021-06-04 | Disposition: A | Discharge status: Home / Self Care | Payer: Medicare PPO | Source: Ambulatory Visit | Attending: Orthopedic Surgery | Admitting: Orthopedic Surgery

## 2021-06-04 ENCOUNTER — Encounter (HOSPITAL_COMMUNITY): Payer: Self-pay

## 2021-06-04 VITALS — BP 160/64 | HR 67 | Temp 98.0°F | Resp 18 | Ht 62.0 in | Wt 131.0 lb

## 2021-06-04 DIAGNOSIS — M8000XA Age-related osteoporosis with current pathological fracture, unspecified site, initial encounter for fracture: Secondary | ICD-10-CM | POA: Insufficient documentation

## 2021-06-04 DIAGNOSIS — Z01818 Encounter for other preprocedural examination: Secondary | ICD-10-CM | POA: Diagnosis not present

## 2021-06-04 LAB — CBC WITH DIFFERENTIAL/PLATELET
Abs Immature Granulocytes: 0.03 10*3/uL (ref 0.00–0.07)
Basophils Absolute: 0 10*3/uL (ref 0.0–0.1)
Basophils Relative: 0 %
Eosinophils Absolute: 0.1 10*3/uL (ref 0.0–0.5)
Eosinophils Relative: 1 %
HCT: 32.4 % — ABNORMAL LOW (ref 36.0–46.0)
Hemoglobin: 11.1 g/dL — ABNORMAL LOW (ref 12.0–15.0)
Immature Granulocytes: 1 %
Lymphocytes Relative: 19 %
Lymphs Abs: 1 10*3/uL (ref 0.7–4.0)
MCH: 34.7 pg — ABNORMAL HIGH (ref 26.0–34.0)
MCHC: 34.3 g/dL (ref 30.0–36.0)
MCV: 101.3 fL — ABNORMAL HIGH (ref 80.0–100.0)
Monocytes Absolute: 0.5 10*3/uL (ref 0.1–1.0)
Monocytes Relative: 8 %
Neutro Abs: 3.9 10*3/uL (ref 1.7–7.7)
Neutrophils Relative %: 71 %
Platelets: 306 10*3/uL (ref 150–400)
RBC: 3.2 MIL/uL — ABNORMAL LOW (ref 3.87–5.11)
RDW: 13.4 % (ref 11.5–15.5)
WBC: 5.5 10*3/uL (ref 4.0–10.5)
nRBC: 0 % (ref 0.0–0.2)

## 2021-06-04 LAB — COMPREHENSIVE METABOLIC PANEL
ALT: 13 U/L (ref 0–44)
AST: 19 U/L (ref 15–41)
Albumin: 3.4 g/dL — ABNORMAL LOW (ref 3.5–5.0)
Alkaline Phosphatase: 98 U/L (ref 38–126)
Anion gap: 9 (ref 5–15)
BUN: 13 mg/dL (ref 8–23)
CO2: 24 mmol/L (ref 22–32)
Calcium: 9.3 mg/dL (ref 8.9–10.3)
Chloride: 99 mmol/L (ref 98–111)
Creatinine, Ser: 0.65 mg/dL (ref 0.44–1.00)
GFR, Estimated: >60 mL/min (ref >60)
Glucose, Bld: 106 mg/dL — ABNORMAL HIGH (ref 70–99)
Potassium: 3.4 mmol/L — ABNORMAL LOW (ref 3.5–5.1)
Sodium: 132 mmol/L — ABNORMAL LOW (ref 135–145)
Total Bilirubin: 0.7 mg/dL (ref 0.3–1.2)
Total Protein: 8.3 g/dL — ABNORMAL HIGH (ref 6.5–8.1)

## 2021-06-04 LAB — URINALYSIS, ROUTINE W REFLEX MICROSCOPIC
Bilirubin Urine: NEGATIVE
Glucose, UA: NEGATIVE mg/dL
Ketones, ur: NEGATIVE mg/dL
Nitrite: NEGATIVE
Protein, ur: NEGATIVE mg/dL
Specific Gravity, Urine: 1.013 (ref 1.005–1.030)
WBC, UA: >50 WBC/hpf — ABNORMAL HIGH (ref 0–5)
pH: 6 (ref 5.0–8.0)

## 2021-06-04 LAB — TYPE AND SCREEN
ABO/RH(D): O POS
Antibody Screen: NEGATIVE

## 2021-06-04 LAB — PROTIME-INR
INR: 1.1 (ref 0.8–1.2)
Prothrombin Time: 13.7 seconds (ref 11.4–15.2)

## 2021-06-04 LAB — APTT: aPTT: 29 seconds (ref 24–36)

## 2021-06-04 LAB — SURGICAL PCR SCREEN
MRSA, PCR: NEGATIVE
Staphylococcus aureus: NEGATIVE

## 2021-06-04 NOTE — Telephone Encounter (Signed)
Called and spoke with patient. She stated that she has been in contact with Dr. Laurena Bering office this afternoon. She is aware of MR's recommendations.   Nothing further needed at time of call.

## 2021-06-04 NOTE — Telephone Encounter (Signed)
Called patient but call went straight to VM. Left message for patient to call back.  

## 2021-06-04 NOTE — Telephone Encounter (Signed)
Edward Qualia was able to get hold of Dr Lynann Bologna cell and text him yesterday but could not get hold of him. He has not replied as yet to me. Please call his office and leave message for him to call me back anytime. Can give my cell  Thanks  MR

## 2021-06-04 NOTE — Progress Notes (Signed)
PCP - Burnard Bunting Cardiologist - [place], see note from 09/20/19 (no need to follow up unless there's an issue)  Chest x-ray - n/a EKG - 06/04/21 Stress Test - 2009   ERAS Protcol - yes, Ensure ordered & given   COVID TEST- n/a (ambulatory)   Anesthesia review: yes, heart history, see Dr. Elmarie Shiley note from 09/20/19  Patient denies shortness of breath, fever, cough and chest pain at PAT appointment   All instructions explained to the patient, with a verbal understanding of the material. Patient agrees to go over the instructions while at home for a better understanding. Patient also instructed to self quarantine after being tested for COVID-19. The opportunity to ask questions was provided.

## 2021-06-04 NOTE — Telephone Encounter (Signed)
Spoke to Tyra with Dr. Laurena Bering office and requested that Dr. Lynann Bologna contact Dr. Chase Caller.

## 2021-06-04 NOTE — Telephone Encounter (Signed)
MR, please advise if you were still going to call pt's surgeon. Surgery is scheduled Thursday 10/27.

## 2021-06-04 NOTE — Telephone Encounter (Signed)
Discussed with Dr. Lynann Bologna over the phone and also surgical coordinator in 2 separate conversations back-to-back.  He said he can do the surgery with the patient on steroids Explained to him that patients been on chronic steroids and the steroids cannot be abruptly stopped. 3.  His surgical coordinator will communicate this with anesthesia team and they might have to do stress dose steroids depending on patient condition In any event surgery is currently canceled because insurance is asking Dr. Lynann Bologna to get an MRI Plan Please communicate with patient that I have actually spoken to Dr. Laurena Bering team.  After that he can close the message  SIGNATURE    Dr. Brand Males, M.D., F.C.C.P,  Pulmonary and Critical Care Medicine Staff Physician, [place] Director - Interstitial Lung Disease  Program  Pulmonary [place] at [place], [place], [address]  NPI Number:  NPI #[id number]  Pager: [phone], If no answer  -> Check AMION or Try [phone] Telephone (clinical office): [phone] Telephone (research): [phone]  3:26 PM 06/04/2021

## 2021-06-05 ENCOUNTER — Telehealth: Payer: Self-pay | Admitting: Internal Medicine

## 2021-06-05 NOTE — Telephone Encounter (Signed)
Dr. Chase Caller is Dr. Laurena Bering office going to contact pt? Isn't this surgery canceled?  Please advise.

## 2021-06-05 NOTE — Telephone Encounter (Signed)
Fax received from Dr. Phylliss Bob to perform a Lumbar 3 and Lumbar 5 kyphoplasty on patient.  Patient needs surgery clearance. Patient was just seen 04/11/2021. Office protocol is a risk assessment can be sent to surgeon if patient has been seen in 60 days or less.   Sending to Dr. Chase Caller for risk assessment or recommendations if patient needs to be seen in office prior to surgical procedure.    Patient's surgery has currently been canceled pending MRI and discussion of Prednisone. Dr. Chase Caller is aware. Will hold on to clearance form to address at a later time.

## 2021-06-06 ENCOUNTER — Encounter (HOSPITAL_COMMUNITY): Admission: RE | Payer: Self-pay | Source: Home / Self Care

## 2021-06-06 ENCOUNTER — Other Ambulatory Visit: Payer: Self-pay | Admitting: Orthopedic Surgery

## 2021-06-06 ENCOUNTER — Ambulatory Visit (HOSPITAL_COMMUNITY): Admission: RE | Admit: 2021-06-06 | Payer: Medicare PPO | Source: Home / Self Care | Admitting: Orthopedic Surgery

## 2021-06-06 DIAGNOSIS — M545 Low back pain, unspecified: Secondary | ICD-10-CM

## 2021-06-06 SURGERY — KYPHOPLASTY
Anesthesia: General

## 2021-06-06 NOTE — Telephone Encounter (Signed)
Spoke with the pt  She states she is currently still on pred 3 mg daily  She will be having back surgery sometime in the future and wants to know if this dose should be adjusted right before and also after this?

## 2021-06-06 NOTE — Telephone Encounter (Signed)
Patient is checking on the message for Prednisone before and after surgery. Patient phone number is [phone].

## 2021-06-07 NOTE — Telephone Encounter (Signed)
Please see duplicate phone note.  Patient is aware of prednisone dosage.

## 2021-06-07 NOTE — Telephone Encounter (Signed)
Dr Lynann Bologna said he is ok doing the surgery on prednisone The surgery is now postponed till she complees an MRI. So I would say till she sees me later in nov 2022 to continue the 3mg  per day  Typically before surgery - they giave stress dose steroids to tide over

## 2021-06-07 NOTE — Telephone Encounter (Signed)
Per Dr Lynann Bologna the surgery for today / yesterday - was cancelled by Dr Lynann Bologna because insurance wanted an MRI. He said he would do surgery with the steroids and talk to anesthesia about stress dose steroids.   If there is still lot of confusion with steroids, (there are multiple messages) I recommend, a visit to do pre op clearance. Can see an app

## 2021-06-07 NOTE — Telephone Encounter (Signed)
Patient is aware of below recommendations and voiced her understanding.  Patient stated that she is scheduled for MRI tomorrow at 6. Pending OV 07/08/2021.  Routing to MR as an Pharmacist, hospital.

## 2021-06-08 ENCOUNTER — Ambulatory Visit
Admission: RE | Admit: 2021-06-08 | Discharge: 2021-06-08 | Disposition: A | Discharge status: Home / Self Care | Payer: Medicare PPO | Source: Ambulatory Visit | Attending: Orthopedic Surgery | Admitting: Orthopedic Surgery

## 2021-06-08 ENCOUNTER — Other Ambulatory Visit: Payer: Self-pay

## 2021-06-08 DIAGNOSIS — M545 Low back pain, unspecified: Secondary | ICD-10-CM | POA: Diagnosis not present

## 2021-06-08 DIAGNOSIS — M48061 Spinal stenosis, lumbar region without neurogenic claudication: Secondary | ICD-10-CM | POA: Diagnosis not present

## 2021-06-11 DIAGNOSIS — M545 Low back pain, unspecified: Secondary | ICD-10-CM | POA: Diagnosis not present

## 2021-06-14 ENCOUNTER — Ambulatory Visit: Payer: Medicare PPO | Admitting: Internal Medicine

## 2021-06-25 ENCOUNTER — Other Ambulatory Visit: Payer: Self-pay | Admitting: Orthopedic Surgery

## 2021-06-25 DIAGNOSIS — W19XXXA Unspecified fall, initial encounter: Secondary | ICD-10-CM | POA: Diagnosis not present

## 2021-06-25 DIAGNOSIS — S0083XA Contusion of other part of head, initial encounter: Secondary | ICD-10-CM | POA: Diagnosis not present

## 2021-06-25 DIAGNOSIS — S51812A Laceration without foreign body of left forearm, initial encounter: Secondary | ICD-10-CM | POA: Diagnosis not present

## 2021-06-26 ENCOUNTER — Other Ambulatory Visit: Payer: Self-pay

## 2021-06-26 ENCOUNTER — Encounter (HOSPITAL_COMMUNITY): Payer: Self-pay | Admitting: Orthopedic Surgery

## 2021-06-26 NOTE — Progress Notes (Signed)
Pt updated on the surgery time 1347-1507, arrival 1045. Pt also made aware to the the Pre-Surgery Ensure by 1015.   Coronavirus Screening  Have you experienced the following symptoms:  Cough yes/no: No Fever (>100.77F)  yes/no: No Runny nose yes/no: No Sore throat yes/no: No Difficulty breathing/shortness of breath  yes/no: No  Have you or a family member traveled in the last 14 days and where? yes/no: No   If the patient indicates "YES" to the above questions, their PAT will be rescheduled to limit the exposure to others and, the surgeon will be notified. THE PATIENT WILL NEED TO BE ASYMPTOMATIC FOR 14 DAYS.   If the patient is not experiencing any of these symptoms, the PAT nurse will instruct them to NOT bring anyone with them to their appointment since they may have these symptoms or traveled as well.   Please remind your patients and families that hospital visitation restrictions are in effect and the importance of the restrictions.

## 2021-06-27 ENCOUNTER — Ambulatory Visit (HOSPITAL_COMMUNITY): Payer: Medicare PPO | Admitting: Anesthesiology

## 2021-06-27 ENCOUNTER — Other Ambulatory Visit: Payer: Self-pay

## 2021-06-27 ENCOUNTER — Encounter (HOSPITAL_COMMUNITY)
Admission: RE | Disposition: A | Discharge status: Home / Self Care | Payer: Self-pay | Source: Home / Self Care | Attending: Orthopedic Surgery

## 2021-06-27 ENCOUNTER — Ambulatory Visit (HOSPITAL_COMMUNITY): Payer: Medicare PPO

## 2021-06-27 ENCOUNTER — Other Ambulatory Visit (HOSPITAL_COMMUNITY): Payer: Self-pay | Admitting: Orthopedic Surgery

## 2021-06-27 ENCOUNTER — Ambulatory Visit (HOSPITAL_COMMUNITY)
Admission: RE | Admit: 2021-06-27 | Discharge: 2021-06-27 | Disposition: A | Discharge status: Home / Self Care | Payer: Medicare PPO | Attending: Orthopedic Surgery | Admitting: Orthopedic Surgery

## 2021-06-27 ENCOUNTER — Encounter (HOSPITAL_COMMUNITY): Payer: Self-pay | Admitting: Orthopedic Surgery

## 2021-06-27 DIAGNOSIS — Z86718 Personal history of other venous thrombosis and embolism: Secondary | ICD-10-CM | POA: Insufficient documentation

## 2021-06-27 DIAGNOSIS — Z981 Arthrodesis status: Secondary | ICD-10-CM | POA: Diagnosis not present

## 2021-06-27 DIAGNOSIS — G8918 Other acute postprocedural pain: Secondary | ICD-10-CM | POA: Insufficient documentation

## 2021-06-27 DIAGNOSIS — M8580 Other specified disorders of bone density and structure, unspecified site: Secondary | ICD-10-CM | POA: Insufficient documentation

## 2021-06-27 DIAGNOSIS — R262 Difficulty in walking, not elsewhere classified: Secondary | ICD-10-CM | POA: Insufficient documentation

## 2021-06-27 DIAGNOSIS — Z419 Encounter for procedure for purposes other than remedying health state, unspecified: Secondary | ICD-10-CM

## 2021-06-27 DIAGNOSIS — I10 Essential (primary) hypertension: Secondary | ICD-10-CM | POA: Diagnosis not present

## 2021-06-27 DIAGNOSIS — M8008XA Age-related osteoporosis with current pathological fracture, vertebra(e), initial encounter for fracture: Secondary | ICD-10-CM | POA: Diagnosis not present

## 2021-06-27 DIAGNOSIS — E039 Hypothyroidism, unspecified: Secondary | ICD-10-CM | POA: Insufficient documentation

## 2021-06-27 DIAGNOSIS — M6281 Muscle weakness (generalized): Secondary | ICD-10-CM | POA: Insufficient documentation

## 2021-06-27 DIAGNOSIS — M1990 Unspecified osteoarthritis, unspecified site: Secondary | ICD-10-CM | POA: Insufficient documentation

## 2021-06-27 DIAGNOSIS — K219 Gastro-esophageal reflux disease without esophagitis: Secondary | ICD-10-CM | POA: Insufficient documentation

## 2021-06-27 DIAGNOSIS — I739 Peripheral vascular disease, unspecified: Secondary | ICD-10-CM | POA: Diagnosis not present

## 2021-06-27 DIAGNOSIS — H1045 Other chronic allergic conjunctivitis: Secondary | ICD-10-CM | POA: Insufficient documentation

## 2021-06-27 DIAGNOSIS — Z7952 Long term (current) use of systemic steroids: Secondary | ICD-10-CM | POA: Insufficient documentation

## 2021-06-27 DIAGNOSIS — F411 Generalized anxiety disorder: Secondary | ICD-10-CM | POA: Insufficient documentation

## 2021-06-27 DIAGNOSIS — E7800 Pure hypercholesterolemia, unspecified: Secondary | ICD-10-CM | POA: Diagnosis not present

## 2021-06-27 DIAGNOSIS — M62838 Other muscle spasm: Secondary | ICD-10-CM | POA: Insufficient documentation

## 2021-06-27 HISTORY — PX: KYPHOPLASTY: SHX5884

## 2021-06-27 LAB — TYPE AND SCREEN
ABO/RH(D): O POS
Antibody Screen: NEGATIVE

## 2021-06-27 SURGERY — KYPHOPLASTY
Anesthesia: General | Site: Spine Lumbar

## 2021-06-27 MED ORDER — CHLORHEXIDINE GLUCONATE 0.12 % MT SOLN
OROMUCOSAL | Status: AC
  Administered 2021-06-27: 11:00:00 15 mL via OROMUCOSAL
  Filled 2021-06-27: qty 15

## 2021-06-27 MED ORDER — POVIDONE-IODINE 7.5 % EX SOLN
Freq: Once | CUTANEOUS | Status: AC
  Filled 2021-06-27: qty 118

## 2021-06-27 MED ORDER — SUGAMMADEX SODIUM 200 MG/2ML IV SOLN
INTRAVENOUS | Status: DC | PRN
  Administered 2021-06-27: 200 mg via INTRAVENOUS

## 2021-06-27 MED ORDER — FENTANYL CITRATE (PF) 250 MCG/5ML IJ SOLN
INTRAMUSCULAR | Status: AC
  Filled 2021-06-27: qty 5

## 2021-06-27 MED ORDER — LACTATED RINGERS IV SOLN: INTRAVENOUS | Status: DC

## 2021-06-27 MED ORDER — BUPIVACAINE-EPINEPHRINE (PF) 0.25% -1:200000 IJ SOLN
INTRAMUSCULAR | Status: AC
  Filled 2021-06-27: qty 30

## 2021-06-27 MED ORDER — PROPOFOL 10 MG/ML IV BOLUS
INTRAVENOUS | Status: DC | PRN
  Administered 2021-06-27: 20 mg via INTRAVENOUS
  Administered 2021-06-27: 80 mg via INTRAVENOUS

## 2021-06-27 MED ORDER — ROCURONIUM BROMIDE 10 MG/ML (PF) SYRINGE
PREFILLED_SYRINGE | INTRAVENOUS | Status: AC
  Filled 2021-06-27: qty 30

## 2021-06-27 MED ORDER — EPHEDRINE SULFATE-NACL 50-0.9 MG/10ML-% IV SOSY
PREFILLED_SYRINGE | INTRAVENOUS | Status: DC | PRN
  Administered 2021-06-27: 5 mg via INTRAVENOUS

## 2021-06-27 MED ORDER — PHENYLEPHRINE 40 MCG/ML (10ML) SYRINGE FOR IV PUSH (FOR BLOOD PRESSURE SUPPORT)
PREFILLED_SYRINGE | INTRAVENOUS | Status: DC | PRN
Start: 2021-06-27 — End: 2021-06-27
  Administered 2021-06-27: 120 ug via INTRAVENOUS

## 2021-06-27 MED ORDER — ROCURONIUM BROMIDE 10 MG/ML (PF) SYRINGE
PREFILLED_SYRINGE | INTRAVENOUS | Status: DC | PRN
Start: 2021-06-27 — End: 2021-06-27
  Administered 2021-06-27: 50 mg via INTRAVENOUS

## 2021-06-27 MED ORDER — PHENYLEPHRINE HCL-NACL 20-0.9 MG/250ML-% IV SOLN
INTRAVENOUS | Status: DC | PRN
  Administered 2021-06-27: 20 ug/min via INTRAVENOUS

## 2021-06-27 MED ORDER — LIDOCAINE 2% (20 MG/ML) 5 ML SYRINGE
INTRAMUSCULAR | Status: AC
  Filled 2021-06-27: qty 5

## 2021-06-27 MED ORDER — EPHEDRINE 5 MG/ML INJ
INTRAVENOUS | Status: AC
  Filled 2021-06-27: qty 10

## 2021-06-27 MED ORDER — BUPIVACAINE-EPINEPHRINE (PF) 0.25% -1:200000 IJ SOLN
INTRAMUSCULAR | Status: DC | PRN
  Administered 2021-06-27: 30 mL via PERINEURAL

## 2021-06-27 MED ORDER — ORAL CARE MOUTH RINSE: 15.0000 mL | Freq: Once | OROMUCOSAL | Status: AC

## 2021-06-27 MED ORDER — PROPOFOL 500 MG/50ML IV EMUL
INTRAVENOUS | Status: DC | PRN
  Administered 2021-06-27: 20 ug/kg/min via INTRAVENOUS

## 2021-06-27 MED ORDER — LIDOCAINE 2% (20 MG/ML) 5 ML SYRINGE
INTRAMUSCULAR | Status: DC | PRN
  Administered 2021-06-27: 40 mg via INTRAVENOUS

## 2021-06-27 MED ORDER — OXYCODONE-ACETAMINOPHEN 5-325 MG PO TABS: 1.0000 | ORAL_TABLET | ORAL | 0 refills | Status: DC | PRN

## 2021-06-27 MED ORDER — PHENYLEPHRINE 40 MCG/ML (10ML) SYRINGE FOR IV PUSH (FOR BLOOD PRESSURE SUPPORT)
PREFILLED_SYRINGE | INTRAVENOUS | Status: AC
  Filled 2021-06-27: qty 30

## 2021-06-27 MED ORDER — 0.9 % SODIUM CHLORIDE (POUR BTL) OPTIME
TOPICAL | Status: DC | PRN
  Administered 2021-06-27 (×3): 1000 mL

## 2021-06-27 MED ORDER — FENTANYL CITRATE (PF) 100 MCG/2ML IJ SOLN: 25.0000 ug | INTRAMUSCULAR | Status: DC | PRN

## 2021-06-27 MED ORDER — CHLORHEXIDINE GLUCONATE 0.12 % MT SOLN: 15.0000 mL | Freq: Once | OROMUCOSAL | Status: AC

## 2021-06-27 MED ORDER — ONDANSETRON HCL 4 MG/2ML IJ SOLN
INTRAMUSCULAR | Status: DC | PRN
  Administered 2021-06-27: 4 mg via INTRAVENOUS

## 2021-06-27 MED ORDER — DEXAMETHASONE SODIUM PHOSPHATE 10 MG/ML IJ SOLN
INTRAMUSCULAR | Status: DC | PRN
  Administered 2021-06-27: 10 mg via INTRAVENOUS

## 2021-06-27 MED ORDER — METHOCARBAMOL 500 MG PO TABS: 500.0000 mg | ORAL_TABLET | Freq: Four times a day (QID) | ORAL | 1 refills | Status: DC | PRN

## 2021-06-27 MED ORDER — IOPAMIDOL (ISOVUE-300) INJECTION 61%
INTRAVENOUS | Status: DC | PRN
  Administered 2021-06-27: 15:00:00 100 mL

## 2021-06-27 MED ORDER — FENTANYL CITRATE (PF) 250 MCG/5ML IJ SOLN
INTRAMUSCULAR | Status: DC | PRN
  Administered 2021-06-27 (×2): 25 ug via INTRAVENOUS
  Administered 2021-06-27: 50 ug via INTRAVENOUS

## 2021-06-27 MED ORDER — CEFAZOLIN SODIUM-DEXTROSE 2-4 GM/100ML-% IV SOLN
2.0000 g | INTRAVENOUS | Status: AC
  Administered 2021-06-27: 15:00:00 2 g via INTRAVENOUS

## 2021-06-27 MED ORDER — CEFAZOLIN SODIUM-DEXTROSE 2-4 GM/100ML-% IV SOLN
INTRAVENOUS | Status: AC
  Filled 2021-06-27: qty 100

## 2021-06-27 SURGICAL SUPPLY — 48 items
BAG COUNTER SPONGE SURGICOUNT (BAG) ×2 IMPLANT
BAG SPNG CNTER NS LX DISP (BAG) ×1
BLADE SURG 15 STRL LF DISP TIS (BLADE) ×1 IMPLANT
BLADE SURG 15 STRL SS (BLADE) ×2
CEMENT KYPHON C01A KIT/MIXER (Cement) ×2 IMPLANT
COVER MAYO STAND STRL (DRAPES) ×2 IMPLANT
COVER SURGICAL LIGHT HANDLE (MISCELLANEOUS) ×2 IMPLANT
CURETTE EXPRESS SZ2 7MM (INSTRUMENTS) IMPLANT
CURRETTE EXPRESS SZ2 7MM (INSTRUMENTS) ×4
DRAPE C-ARM 42X72 X-RAY (DRAPES) ×2 IMPLANT
DRAPE HALF SHEET 40X57 (DRAPES) IMPLANT
DRAPE INCISE IOBAN 66X45 STRL (DRAPES) ×2 IMPLANT
DRAPE LAPAROTOMY T 102X78X121 (DRAPES) ×2 IMPLANT
DRAPE SURG 17X23 STRL (DRAPES) ×8 IMPLANT
DRAPE WARM FLUID 44X44 (DRAPES) ×2 IMPLANT
DURAPREP 26ML APPLICATOR (WOUND CARE) ×2 IMPLANT
GAUZE 4X4 16PLY ~~LOC~~+RFID DBL (SPONGE) ×2 IMPLANT
GAUZE SPONGE 2X2 8PLY STRL LF (GAUZE/BANDAGES/DRESSINGS) ×1 IMPLANT
GLOVE SRG 8 PF TXTR STRL LF DI (GLOVE) ×1 IMPLANT
GLOVE SURG ENC MOIS LTX SZ6.5 (GLOVE) ×2 IMPLANT
GLOVE SURG ENC MOIS LTX SZ8 (GLOVE) ×2 IMPLANT
GLOVE SURG UNDER POLY LF SZ7 (GLOVE) ×2 IMPLANT
GLOVE SURG UNDER POLY LF SZ8 (GLOVE) ×2
GOWN STRL REUS W/ TWL LRG LVL3 (GOWN DISPOSABLE) ×2 IMPLANT
GOWN STRL REUS W/ TWL XL LVL3 (GOWN DISPOSABLE) ×1 IMPLANT
GOWN STRL REUS W/TWL LRG LVL3 (GOWN DISPOSABLE) ×4
GOWN STRL REUS W/TWL XL LVL3 (GOWN DISPOSABLE) ×2
INTRODUCER DEVICE OSTEO LEVEL (INTRODUCER) ×2 IMPLANT
KIT BASIN OR (CUSTOM PROCEDURE TRAY) ×2 IMPLANT
KIT TURNOVER KIT B (KITS) ×2 IMPLANT
NDL HYPO 25X1 1.5 SAFETY (NEEDLE) ×1 IMPLANT
NDL SPNL 18GX3.5 QUINCKE PK (NEEDLE) ×2 IMPLANT
NEEDLE 22X1 1/2 (OR ONLY) (NEEDLE) IMPLANT
NEEDLE HYPO 25X1 1.5 SAFETY (NEEDLE) ×2 IMPLANT
NEEDLE SPNL 18GX3.5 QUINCKE PK (NEEDLE) ×4 IMPLANT
NS IRRIG 1000ML POUR BTL (IV SOLUTION) ×2 IMPLANT
PACK UNIVERSAL I (CUSTOM PROCEDURE TRAY) ×2 IMPLANT
PAD ARMBOARD 7.5X6 YLW CONV (MISCELLANEOUS) ×4 IMPLANT
POSITIONER HEAD PRONE TRACH (MISCELLANEOUS) ×2 IMPLANT
SPONGE GAUZE 2X2 STER 10/PKG (GAUZE/BANDAGES/DRESSINGS) ×1
SUT MNCRL AB 4-0 PS2 18 (SUTURE) ×3 IMPLANT
SYR BULB IRRIG 60ML STRL (SYRINGE) ×2 IMPLANT
SYR CONTROL 10ML LL (SYRINGE) ×2 IMPLANT
TAPE CLOTH 4X10 WHT NS (GAUZE/BANDAGES/DRESSINGS) ×1 IMPLANT
TOWEL GREEN STERILE (TOWEL DISPOSABLE) ×2 IMPLANT
TOWEL GREEN STERILE FF (TOWEL DISPOSABLE) ×2 IMPLANT
TRAY KYPHOPAK 15/3 ONESTEP 1ST (MISCELLANEOUS) IMPLANT
TRAY KYPHOPAK 20/3 ONESTEP 1ST (MISCELLANEOUS) ×1 IMPLANT

## 2021-06-27 NOTE — Transfer of Care (Signed)
Immediate Anesthesia Transfer of Care Note  Patient: [name] Page  Procedure(s) Performed: LUMBAR 3 AND LUMBAR  5 KYPHOPLASTY (Spine Lumbar)  Patient Location: PACU  Anesthesia Type:General  Level of Consciousness: drowsy  Airway & Oxygen Therapy: Patient Spontanous Breathing and Patient connected to face mask oxygen  Post-op Assessment: Report given to RN and Post -op Vital signs reviewed and stable  Post vital signs: Reviewed and stable  Last Vitals:  Vitals Value Taken Time  BP 164/69 06/27/21 1607  Temp    Pulse 61 06/27/21 1608  Resp 18 06/27/21 1608  SpO2 94 % 06/27/21 1608  Vitals shown include unvalidated device data.  Last Pain:  Vitals:   06/27/21 1100  TempSrc: Oral  PainSc: 4       Patients Stated Pain Goal: 2 (90/94/00 0505)  Complications: No notable events documented.

## 2021-06-27 NOTE — Op Note (Addendum)
PATIENT NAME: [name] Page   MEDICAL RECORD NO.:   [id number]   DATE OF BIRTH: 1931-08-11   DATE OF PROCEDURE: 06/27/2021                               OPERATIVE REPORT   PREOPERATIVE DIAGNOSIS:  L3, L5 osteoporotic compression fractures  POSTOPERATIVE DIAGNOSIS:  L3, L5 osteoporotic compression fractures  PROCEDURE:  L3 and L5 kyphoplasty procedures.  SURGEON:  Phylliss Bob, MD.  ASSISTANT:  None  ANESTHESIA:  General endotracheal anesthesia.  COMPLICATIONS:  None.  DISPOSITION:  Stable.  ESTIMATED BLOOD LOSS:  Minimal.  INDICATIONS FOR SURGERY:  Briefly, Ms. Soja is a very pleasant 87-year-old female, who did have an onset of pain in her low back.   Her pain was rather severe.  The patient's imaging studies did reveal  L3 and L5 compression fractures. We did attempt a trial of nonoperative treatment, but the patient continued to feel rather debilitated as a result of her ongoing pain.  Given her ongoing pain and dysfunction, we did discuss proceeding with the procedure noted above.  I did fully discuss the procedure with the patient, and she did wish to proceed.  Of note, the patient informed me prior to surgery today but she did have a significant fall 5 days ago.  She has been having pain in various regions of her body since the fall, including increased pain in her back.  I did let her know that this does bring the possibility of a new compression fracture at some other level in her back, and that one option was to not proceed with surgery but work-up this possibility.  She did express that she instead would like to proceed with surgery, and we did make a plan that if her pain did not improve postoperatively, we would proceed with an updated MRI.  OPERATIVE DETAILS:  On 06/27/2021 , the patient was brought to surgery and general endotracheal anesthesia was administered.  The patient was placed prone on a well-padded flat Jackson bed with gel rolls placed under  the patient's chest and hips.  Antibiotics were given.  AP and lateral fluoroscopy was brought into the field.  The L3 and L5 pedicles were marked out in the usual fashion.  After a time-out procedure was performed, I did advance Jamshidis across the L3 abd L5 pedicles on the right and left sides.  I then drilled through the Jamshidis.  I then inserted kyphoplasty balloons at each level, and I was able to inflate the balloons with approximately 6cc of contrast at each level.  At this point, after the cement was mixed, a total of approximately 7cc of cement was injected at each level, half on the right, and half on the left.  Excellent interdigitation of cement was identified.  There was noted to be interdigitation of cement into the fractured upper endplate as well.  There was no abnormal extravasation of cement identified.  The cement was then allowed to harden, after which point the Jamshidis were removed.  The wounds  were then irrigated and closed using 4-0 Monocryl.  Bacitracin and a sterile dressing were applied.  The patient was then awoken from general endotracheal anesthesia and transferred to recovery in stable condition.   Phylliss Bob, MD

## 2021-06-27 NOTE — H&P (Signed)
PREOPERATIVE H&P  Chief Complaint: Low back pain  HPI: [name] Page is a 87 y.o. female who presents with ongoing pain in the low back  MRI reveals active compression fracture at L3 and L5  Patient has failed multiple forms of conservative care and continues to have pain (see office notes for additional details regarding the patient's full course of treatment)  Past Medical History:  Diagnosis Date   Alopecia 10/28/2016   Arthritis    Cervical spondylosis    C3-4 AND C4-5   Chest pain 07/28/2008   H/O, normal stress nuclear EF 78%   Chronic cough 07/12/2014   Collapsed lung  12/11   being treated at [facility] - in left lung   Complication of anesthesia    Cough 07/30/2010   Qualifier: Diagnosis of  By: Chase Caller MD, Murali     Cystocele 05/23/2015   Cystocele, midline 05/02/2013   DEEP VEIN THROMBOSIS/PHLEBITIS 07/30/2010   Qualifier: History of  By: Harvest Dark CMA, Jennifer     Dislocation closed, shoulder 7/14   DVT (deep venous thrombosis) ([place]) 2006   Encounter for therapeutic drug monitoring 10/03/2014   GERD (gastroesophageal reflux disease)    occ   Headache    hx   High cholesterol    History of recurrent UTIs 09/24/2011   HYPERLIPIDEMIA 07/30/2010   Qualifier: Diagnosis of  By: Harvest Dark CMA, Jennifer     Hypertension    HYPERTENSION 07/30/2010   Qualifier: Diagnosis of  By: Harvest Dark CMA, Jennifer     Hypothyroidism    Nasal itching 01/30/2014   Osteopenia 05/23/2015   Peripheral vascular disease ([place]) 2006   dvt's and 50 yrs ago   Pleuritic pain 12/17/2015   PONV (postoperative nausea and vomiting)    Primary localized osteoarthritis of right hip 08/26/2016   Primary osteoarthritis of right hip 08/26/2016   Pulmonary collapse 07/30/2010   Qualifier: Diagnosis of  By: Chase Caller MD, Murali     PULMONARY NODULE 07/30/2010   Qualifier: Diagnosis of  By: Chase Caller MD, Murali     Rash and nonspecific skin eruption 01/02/2015   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    Sarcoidosis    Sarcoidosis of lung ([place])    Vaginal atrophy 09/24/2011   Visual field defect nasal step 01/02/2015   Past Surgical History:  Procedure Laterality Date   EYE SURGERY     FOOT SURGERY Left 06/2005   bunion hammer toe   HEMORRHOID SURGERY  12/1972   INTRAOCULAR LENS INSERTION     right eye 10 /16 and left 06/16/2005   JOINT REPLACEMENT     SKIN BIOPSY  01/2007   basal cell carcinoma removed from right lower leg    TOTAL HIP ARTHROPLASTY  06/2007   left   TOTAL HIP ARTHROPLASTY Right 08/26/2016   Procedure: TOTAL HIP ARTHROPLASTY ANTERIOR APPROACH;  Surgeon: Melrose Nakayama, MD;  Location: [place];  Service: Orthopedics;  Laterality: Right;   TOTAL KNEE ARTHROPLASTY Right 10/2007   right   Social History   Socioeconomic History   Marital status: Married    Spouse name: Not on file   Number of children: Not on file   Years of education: Not on file   Highest education level: Not on file  Occupational History   Occupation: retired    Fish farm manager: RETIRED  Tobacco Use   Smoking status: Never   Smokeless tobacco: Never  Vaping Use   Vaping Use: Never used  Substance and Sexual Activity  Alcohol use: Yes    Comment: BEER/glass of wine A DAY   Drug use: No   Sexual activity: Never  Other Topics Concern   Not on file  Social History Narrative   Not on file   Social Determinants of Health   Financial Resource Strain: Not on file  Food Insecurity: Not on file  Transportation Needs: Not on file  Physical Activity: Not on file  Stress: Not on file  Social Connections: Not on file   Family History  Problem Relation Age of Onset   Diabetes Mother    Heart failure Mother    Hypertension Father    Diabetes Father    Heart disease Father    Heart attack Father    Allergies  Allergen Reactions   Methotrexate Derivatives     Mild alopecia    Tylenol With Codeine #3 [Acetaminophen-Codeine]     Dizziness, brain fog, difficulty walking   Prior to Admission  medications   Medication Sig Start Date End Date Taking? Authorizing Provider  amLODipine (NORVASC) 10 MG tablet Take 10 mg by mouth daily.    [provider]  azelastine (OPTIVAR) 0.05 % ophthalmic solution Place 1 drop into both eyes 2 (two) times daily.     [provider]  Calcium Citrate-Vitamin D (CALCIUM + D PO) Take 1 tablet by mouth daily.    [provider]  gemfibrozil (LOPID) 600 MG tablet Take 600 mg by mouth 2 (two) times daily before a meal.    [provider]  levothyroxine (SYNTHROID, LEVOTHROID) 25 MCG tablet Take 25 mcg by mouth daily.     [provider]  LORazepam (ATIVAN) 0.5 MG tablet Take 0.5 mg by mouth daily as needed for anxiety or sleep. 05/14/21   [provider]  losartan (COZAAR) 50 MG tablet Take 50 mg by mouth daily.    [provider]  Multiple Vitamin (MULTIVITAMIN) capsule Take 1 capsule by mouth daily.    [provider]  naproxen sodium (ALEVE) 220 MG tablet Take 220 mg by mouth 2 (two) times daily as needed (pain).    [provider]  oxyCODONE-acetaminophen (PERCOCET/ROXICET) 5-325 MG tablet Take 0.5 tablets by mouth every 4 (four) hours as needed for pain. 05/23/21   [provider]  pantoprazole (PROTONIX) 20 MG tablet Take 20 mg by mouth daily.    [provider]  polyethylene glycol powder (GLYCOLAX/MIRALAX) 17 GM/SCOOP powder Take 17 g by mouth at bedtime. 07/26/19   [provider]  predniSONE (DELTASONE) 1 MG tablet Take 3 tablets (3 mg total) by mouth daily with breakfast. 04/11/21   Brand Males, MD     All other systems have been reviewed and were otherwise negative with the exception of those mentioned in the HPI and as above.  Physical Exam: There were no vitals filed for this visit.  There is no height or weight on file to calculate BMI.  General: Alert, no acute distress Cardiovascular: No pedal edema Respiratory: No cyanosis,  no use of accessory musculature Skin: No lesions in the area of chief complaint Neurologic: Sensation intact distally Psychiatric: Patient is competent for consent with normal mood and affect Lymphatic: No axillary or cervical lymphadenopathy   Assessment/Plan: L3 and L5 COMPRESSION FRACTURES Plan for Procedure(s): LUMBAR 3 AND LUMBAR  5 KYPHOPLASTY   Norva Karvonen, MD 06/27/2021 4:05 AM

## 2021-06-27 NOTE — Anesthesia Procedure Notes (Signed)
Procedure Name: Intubation Date/Time: 06/27/2021 2:38 PM Performed by: Vonna Drafts, CRNA Pre-anesthesia Checklist: Patient identified, Emergency Drugs available, Suction available and Patient being monitored Patient Re-evaluated:Patient Re-evaluated prior to induction Oxygen Delivery Method: Circle system utilized Preoxygenation: Pre-oxygenation with 100% oxygen Induction Type: IV induction Ventilation: Mask ventilation without difficulty Laryngoscope Size: Mac and 3 Grade View: Grade II Tube type: Oral Tube size: 7.0 mm Number of attempts: 1 Airway Equipment and Method: Stylet and Oral airway Placement Confirmation: ETT inserted through vocal cords under direct vision, positive ETCO2 and breath sounds checked- equal and bilateral Secured at: 21 cm Tube secured with: Tape Dental Injury: Teeth and Oropharynx as per pre-operative assessment

## 2021-06-27 NOTE — Anesthesia Preprocedure Evaluation (Addendum)
Anesthesia Evaluation  Patient identified by MRN, date of birth, ID band Patient awake    Reviewed: Allergy & Precautions, NPO status , Patient's Chart, lab work & pertinent test results  History of Anesthesia Complications (+) PONV and history of anesthetic complications  Airway Mallampati: III  TM Distance: >3 FB   Mouth opening: Limited Mouth Opening  Dental  (+) Teeth Intact, Caps, Dental Advisory Given   Pulmonary neg pulmonary ROS,    breath sounds clear to auscultation       Cardiovascular hypertension, Pt. on medications + Peripheral Vascular Disease   Rhythm:Regular Rate:Normal     Neuro/Psych  Headaches, negative psych ROS   GI/Hepatic Neg liver ROS, GERD  Medicated,  Endo/Other  Hypothyroidism   Renal/GU negative Renal ROS     Musculoskeletal  (+) Arthritis ,   Abdominal Normal abdominal exam  (+)   Peds  Hematology negative hematology ROS (+)   Anesthesia Other Findings   Reproductive/Obstetrics                            Anesthesia Physical Anesthesia Plan  ASA: 3  Anesthesia Plan: General   Post-op Pain Management:    Induction: Intravenous  PONV Risk Score and Plan: 4 or greater and Ondansetron and Treatment may vary due to age or medical condition  Airway Management Planned: Oral ETT  Additional Equipment: None  Intra-op Plan:   Post-operative Plan: Extubation in OR  Informed Consent: I have reviewed the patients History and Physical, chart, labs and discussed the procedure including the risks, benefits and alternatives for the proposed anesthesia with the patient or authorized representative who has indicated his/her understanding and acceptance.     Dental advisory given  Plan Discussed with: CRNA  Anesthesia Plan Comments: (Possible Glidescope. )       Anesthesia Quick Evaluation

## 2021-06-28 ENCOUNTER — Encounter (HOSPITAL_COMMUNITY): Payer: Self-pay | Admitting: Orthopedic Surgery

## 2021-06-28 DIAGNOSIS — M8580 Other specified disorders of bone density and structure, unspecified site: Secondary | ICD-10-CM | POA: Diagnosis not present

## 2021-06-28 DIAGNOSIS — I10 Essential (primary) hypertension: Secondary | ICD-10-CM | POA: Diagnosis not present

## 2021-06-28 DIAGNOSIS — M62838 Other muscle spasm: Secondary | ICD-10-CM | POA: Diagnosis not present

## 2021-06-28 DIAGNOSIS — K219 Gastro-esophageal reflux disease without esophagitis: Secondary | ICD-10-CM | POA: Diagnosis not present

## 2021-06-28 DIAGNOSIS — G8918 Other acute postprocedural pain: Secondary | ICD-10-CM | POA: Diagnosis not present

## 2021-06-28 DIAGNOSIS — E039 Hypothyroidism, unspecified: Secondary | ICD-10-CM | POA: Diagnosis not present

## 2021-06-28 DIAGNOSIS — H1045 Other chronic allergic conjunctivitis: Secondary | ICD-10-CM | POA: Diagnosis not present

## 2021-06-28 DIAGNOSIS — E569 Vitamin deficiency, unspecified: Secondary | ICD-10-CM | POA: Diagnosis not present

## 2021-06-28 NOTE — Anesthesia Postprocedure Evaluation (Signed)
Anesthesia Post Note  Patient: [name] Page  Procedure(s) Performed: LUMBAR 3 AND LUMBAR  5 KYPHOPLASTY (Spine Lumbar)     Patient location during evaluation: PACU Anesthesia Type: General Level of consciousness: awake and alert Pain management: pain level controlled Vital Signs Assessment: post-procedure vital signs reviewed and stable Respiratory status: spontaneous breathing, nonlabored ventilation, respiratory function stable and patient connected to nasal cannula oxygen Cardiovascular status: blood pressure returned to baseline and stable Postop Assessment: no apparent nausea or vomiting Anesthetic complications: no   No notable events documented.  Last Vitals:  Vitals:   06/27/21 1625 06/27/21 1640  BP: 134/69 (!) 147/81  Pulse: 65 72  Resp: 18 17  Temp:  36.5 C  SpO2: 90% 91%    Last Pain:  Vitals:   06/27/21 1640  TempSrc:   PainSc: 0-No pain                 Effie Berkshire

## 2021-07-02 DIAGNOSIS — I739 Peripheral vascular disease, unspecified: Secondary | ICD-10-CM | POA: Diagnosis not present

## 2021-07-02 DIAGNOSIS — E039 Hypothyroidism, unspecified: Secondary | ICD-10-CM | POA: Diagnosis not present

## 2021-07-02 DIAGNOSIS — M6281 Muscle weakness (generalized): Secondary | ICD-10-CM | POA: Diagnosis not present

## 2021-07-02 DIAGNOSIS — K219 Gastro-esophageal reflux disease without esophagitis: Secondary | ICD-10-CM | POA: Diagnosis not present

## 2021-07-02 DIAGNOSIS — Z4789 Encounter for other orthopedic aftercare: Secondary | ICD-10-CM | POA: Diagnosis not present

## 2021-07-02 DIAGNOSIS — D869 Sarcoidosis, unspecified: Secondary | ICD-10-CM | POA: Diagnosis not present

## 2021-07-02 DIAGNOSIS — S32030D Wedge compression fracture of third lumbar vertebra, subsequent encounter for fracture with routine healing: Secondary | ICD-10-CM | POA: Diagnosis not present

## 2021-07-02 DIAGNOSIS — R262 Difficulty in walking, not elsewhere classified: Secondary | ICD-10-CM | POA: Diagnosis not present

## 2021-07-02 DIAGNOSIS — I10 Essential (primary) hypertension: Secondary | ICD-10-CM | POA: Diagnosis not present

## 2021-07-02 DIAGNOSIS — E785 Hyperlipidemia, unspecified: Secondary | ICD-10-CM | POA: Diagnosis not present

## 2021-07-02 DIAGNOSIS — M5459 Other low back pain: Secondary | ICD-10-CM | POA: Diagnosis not present

## 2021-07-02 DIAGNOSIS — R278 Other lack of coordination: Secondary | ICD-10-CM | POA: Diagnosis not present

## 2021-07-08 ENCOUNTER — Ambulatory Visit: Payer: Medicare PPO | Admitting: Internal Medicine

## 2021-07-17 DIAGNOSIS — Z1231 Encounter for screening mammogram for malignant neoplasm of breast: Secondary | ICD-10-CM | POA: Diagnosis not present

## 2021-07-19 DIAGNOSIS — R279 Unspecified lack of coordination: Secondary | ICD-10-CM | POA: Diagnosis not present

## 2021-07-19 DIAGNOSIS — Z4789 Encounter for other orthopedic aftercare: Secondary | ICD-10-CM | POA: Diagnosis not present

## 2021-07-19 DIAGNOSIS — M6281 Muscle weakness (generalized): Secondary | ICD-10-CM | POA: Diagnosis not present

## 2021-07-23 ENCOUNTER — Other Ambulatory Visit: Payer: Self-pay

## 2021-07-23 ENCOUNTER — Ambulatory Visit: Payer: Medicare PPO | Admitting: Internal Medicine

## 2021-07-23 ENCOUNTER — Encounter: Payer: Self-pay | Admitting: Internal Medicine

## 2021-07-23 ENCOUNTER — Ambulatory Visit (INDEPENDENT_AMBULATORY_CARE_PROVIDER_SITE_OTHER): Payer: Medicare PPO

## 2021-07-23 VITALS — BP 132/68 | HR 71 | Temp 98.1°F | Ht 61.0 in | Wt 124.8 lb

## 2021-07-23 DIAGNOSIS — J9819 Other pulmonary collapse: Secondary | ICD-10-CM | POA: Diagnosis not present

## 2021-07-23 DIAGNOSIS — D869 Sarcoidosis, unspecified: Secondary | ICD-10-CM | POA: Diagnosis not present

## 2021-07-23 DIAGNOSIS — S22060A Wedge compression fracture of T7-T8 vertebra, initial encounter for closed fracture: Secondary | ICD-10-CM

## 2021-07-23 DIAGNOSIS — S22070A Wedge compression fracture of T9-T10 vertebra, initial encounter for closed fracture: Secondary | ICD-10-CM | POA: Diagnosis not present

## 2021-07-23 DIAGNOSIS — R053 Chronic cough: Secondary | ICD-10-CM | POA: Diagnosis not present

## 2021-07-23 NOTE — Progress Notes (Signed)
OV 08/24/2014  Chief Complaint  Patient presents with   Follow-up    Pt stated the gabapentin has not changed her breathing or cough. Pt c/o prod cough with white mucus. Pt denies SOB and CP/tightness.    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 Main symptom is severe chronic cough with high RSI score suggestive of irritable larynx syndrome that is associated as well  She always finds prednisone to help the cough but due to age and side effects despite good functional status she opts to take a low-dose of prednisone. Currently she is on 5 mg prednisone. In November 2015 due to RSI score of 27 for cough but decided to trial gabapentin to tackle the neurogenic component of cough. She reports now that the gabapentin has not helped her at all although objectively the RSI cough score has drop to 20. She is very frustrated. She and her husband are reluctant to have right middle lobectomy given her advanced age despite good functional status. There are no open to trying methotrexate to tackle the inflammation of sarcoidosis and as a steroid sparing agent. She also wants to come off gabapentin   OV 10/02/2014  Chief Complaint  Patient presents with   Follow-up    Pt states her breathing is doing well. Pt states her SOB has improved since last OV. Pt c/o mild cough. Pt denies CP/tightness. Pt stated being on methotrexate has significantly helped.     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 Main symptom is severe chronic cough with high RSI score suggestive of irritable larynx syndrome that is associated as well    Last visit in January 2016 she told me that gabapentin did not help her cough. So we stopped this. We started methotrexate for inflammation in her airway due to  sarcoid causing right middle lobe collapse. We left her on prednisone 5 mg per day and started her on  methotrexate 5 mg once a week. Also gave Hycodan cough syrup. She says that the Hycodan cough syrup did not help However, 2 weeks after starting her methotrexate she started noticing dramatic improvement in her cough. She hardly coughs now. She has significant improvement in her quality of life and she is extremely happy.  RSI cough score had dropped from 25 to 20 with gabapentin has now dropped to 5.  She is asking questions about how long to take methotrexate. And also review of the side effect profile. Of note I did notice that she is on ACE inhibitor that can potentially provoke all make her cough worse. She is not taking gabapentin anymore. There is no fever or chills. At this point in time she's not interested in Bactrim PCP prophylaxis which should be okay given low-dose of methotrexate and prednisone  Past, Family, Social reviewed: no change since last visit    [place] 01/02/2015  Chief Complaint  Patient presents with   Follow-up    Pt recently went to opthamologist and pts vision has decreased since last OV. Pt stated her breathing is unchanged since last OV. Pt c/o mild dry cough. Pt denies CP/tightness.     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 Main symptom is severe chronic cough with high RSI score suggestive of irritable larynx syndrome that is associated as well  Cough /Saarcoid / IRritiable LArynx/ RML syundrome - cough persists but is only mild and baseline.  No problems. Does not feel it impacts qualkity of life. She is off ace inhibitor now  ; forgot to mention this during med reconciliation. No asociated wheeze or dyspnea. Maintained on mehtotrxate 5mg  weekly, folic acid and prednisone 5mg  daily. No evidence of infection. Last LFT was 3 months ago and normal. Hycodan helping cough and wants refill  NEw issue - her eye doc Dr Ellie Lunch called yesterday. PAtient has no complaints of vision but post capsulootmy few week ago and now  yesterday visual field deficit on Rt side is worse. Concern is ocular sarcoid is worse. They are referring her to [place]. PAtient does not want step up Rx of sarcoid till all this is sorted out  New issue - noticed some bruising in left forearm yesterday sponatenous x 2 spots. New. Asytmptomatcic and last 1 weeks some punctate red lesion in distal 1/3 of leg. Associatd with gardening but denies poison ivy. Unchanged since insidious onset. No associatd fever.    OV 04/09/2015  Chief Complaint  Patient presents with   Follow-up    Pt denies cough, SOB, CP/tightness. Pt states her breathing is doing well. Pt states she had a recent rash on BIL lower extremities and saw derm and was given a topical ointment that helped. Per pt the derm thought the rash was not related to sarcoid.     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 Main symptom is severe chronic cough with high RSI score suggestive of irritable larynx syndrome that is associated as well   Cough /Saarcoid / IRritiable LArynx/ RML syundrome - cough is significantly improved. RSI cough score is only 14. She feels that the cough is at a stage where it is completely fine and she can handle things. She is maintained on methotrexate 5 mg weekly, folic acid and prednisone 5 mg daily. She did have some lowish white count after last visit and anemia but we held to the methotrexate and it seems to be stable. She also had a rash at the time of last visit she has seen dermatology and apparently the biopsies do not show this is due to sarcoid. I personally review this outside chart and confirmed that. Of note her last chest x-ray was November 2015 which showed continued right middle lobe collapse. The cough is controlled with the methotrexate and prednisone. She is no longer on ACE inhibitor even though our MAR reports that Also noted that she reports some mild weight gain with prednisone but she says she  will try dietary measures to shake this weight off. She does not want to cough to come and therefore she is content continue with the methotrexate and prednisone. In terms of her vision she is waiting for [facility] referral to [facility]mology in October 2016.    OV 07/16/2015  Chief Complaint  Patient presents with   Follow-up    Pt states her breathing is unchanged but her cough has worsened. Pt states her SOB is at her baseline. Pt states her cough is nonproductive and worse in the evening. Pt denies CP/tightness.     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 Main symptom is severe chronic cough with high RSI score suggestive of irritable larynx syndrome that is associated as well   Cough /Saarcoid / IRritiable LArynx/ RML syundrome -she now reports that her cough is worse for the last few months especially since last visit. RSI cough score is 18 and subjectively  she feels that cough is worse by 25%. Otherwise she is okay. Cough is associated with worsening postnasal drip. She is also asking for refills on a cough syrup methotrexate folic acid. She prefers to have 90 day refills. Despite cough worsened she is not too keen on gabapentin therapy and wants to try other simpler measures at this point in time. There is no fever or sputum production that is associated with the cough.  In terms of her ophthalmology issues she has been reassured apparently it is a left optic now that is problematic in this 50% gone. No active follow-up.   OV 10/18/2015   Chief Complaint  Patient presents with   Follow-up    Pt states her cough has significantly improved. Pt denies SOB and CP/tightness. Pt states overall she feels she is doing well.     Follow-up chronic cough associated with right middle lobe syndrome secondary to sarcoidosis and irritable larynx syndrome  Last visit December 2016. At the time cough that deteriorated. The significant amount  of postnasal drip. We instituted nasal steroids and with this cough is significantly improved and back to baseline. RSI cough score is 10 which is around her baseline. She feels immensely better. She is in a good phase of her health. There are no new issues. She is pending $24 and over-the-counter nasal steroid and we discussed about doing a generic prescription which might be cheaper. She wants refill postdated on her cough syrup. She wants 90 day refills on her methotrexate. She is happy with having a next follow-up in 6 months.   OV 04/22/2016  Chief Complaint  Patient presents with   Follow-up    Pt states her breathing is at baseline. Pt states she is doing well. Pt states has a rare cough, not needing cough syrup any longer. Pt denies CP/tightness and f/c/s.      follow-up chronic cough associated with right middle lobe syndrome secondary to sarcoidosis [also ocular sarcoid] and irritable larynx syndrome  Last visit was me was in March 2017. In the interim she has visited Designer, jewellery. She is on prednisone 5 mg per day and also methotrexate 5 mg once a week.Her cough is significantly improved. She had a chest x-ray May 2017 that I personally visualized shows significant improvement in her right middle lobe laps compared to 2011/2012. She's not on the needing cough syrup. She has right hip DJD now and apparently hip surgery might be contemplated. She is using nasal ster  oid. She does not do Bactrim prophylaxis. RSI cough score show significant improvement with score of 6 which is only mild cough.   OV 10/28/2016  Chief Complaint  Patient presents with   Follow-up    sarcoidosis, wants to discuss hair loss    Fu Chronic cough due to right middle lobe syndrome due to sarcoidosis with associated with ocular sarcoidosis. On methotrexate 5 mg once weekly associated with prednisone low dose 4 mg/5 mg   She continues to do well. Cough is improved 1 RSI cough score 5 is documented  below. In January 2008 and she had replacement and she had a clear chest x-ray that I personally visualized. The cough is only mild. She has a new problem in the last 6 months of mild alopecia that is persistent and stable. Not associated with any pain or radiation or aggravating or relieving factors. It is associated with methotrexate intake. She wants to know about taking Biotene supplements that was advised by the hairdresser. Husband is  wondering if methotrexate is the etiology for this. Methotrexate can cause less than 10% alopecia not otherwise specified.   OV 01/13/2017  Chief Complaint  Patient presents with   Follow-up    Pt states her breathing is baseline. Pt states she has weaned off the methotrexate. Pt states she has mild dry cough. Pt denies CP/tightness.     87 year old female follow-up sarcoidosis associated with severe chronic cough right middle lobe syndrome and associated ocular sarcoidosis. Previously on methotrexate but low-dose prednisone currently.  Last visit in March 2018 because of good control cough and new onset of alopecia has mild he stopped the methotrexate. After this she is here for follow-up. She says that her cough has not gotten worse in the absence of methotrexate. Of note methotrexate was the only agent to improve her cough a few years ago. Her alopecia is improving after stopping the methotrexate. She strongly believes the methotrexate as the cause of alopecia and is agreeable for methotrexate to be listed in her allergies. She is continuing 5 mg of redness on her day. She plans to continue this because of ocular sarcoidosis as well. Her weight is causing some easy bruising which she is accepting. Today RSI cough score is only 3 and shows good control of cough    OV 11/10/2017  Chief Complaint  Patient presents with   Follow-up    Pt states she has been doing well since last visit and denies any complaints or concerns.     87 year old female follow-up  sarcoidosis associated with severe chronic cough right middle lobe syndrome and associated ocular sarcoidosis. Previously on methotrexate as of 2018 but low-dose prednisone currently through 2019. LAsT CXR Jan 2018 - clear   Follow-up chronic cough related to the above. She is now off methotrexate for over a year. Her alopecia has resolved. Her hair is back. She feels happy. She is on 5 mg prednisone a day. She feels well. Cough is minimal to nonexistent. She has ophthalmology appointment pending. I thought the current prednisone is because of her ocular sarcoid but she thinks it might be because of pulmonary and ocular sarcoid. She is willing to taper her prednisone down to 4 mg per day. Her husband is here with her as always. She is up-to-date with flu shot.She feels well without any respiratory symptoms of fatigue. No new medical problems or ER visits   OV 05/25/2018  Subjective:  Patient ID: [name], female , DOB: [birth year] , age 8 y.o. , MRN: [id number] , ADDRESS: [address] [place] [facility] [address]   05/25/2018 -   Chief Complaint  Patient presents with   Follow-up    Doing well at this time.     HPI ZARIEL CAPANO 87 y.o. -     OV 05/25/2018  Chief Complaint  Patient presents with   Follow-up    Doing well at this time.     87 year old female follow-up sarcoidosis (bx proven LUL dec 2014 at [facility]) associated with severe chronic cough right middle lobe syndrome and associated ocular sarcoidosis. Previously on methotrexate as of 2018 but low-dose prednisone currently through 2019. LAsT CXR Jan 2018 - clear   Last seen in April 2019.  At that time she was doing asymptomatic.  She currently is asymptomatic but this is a quicker than expected follow-up.  This is because she went to her primary care physician and she talked about her right middle lobe syndrome.  Therefore a chest x-ray was done and this  is not normal.  Therefore a CT was recommended.  She then called  my office.  We decided to get a CT chest and see her today.  She and her husband insist that she is still asymptomatic.  She is doing well on 4 mg prednisone.  She is exercising regularly.  No cough, no wheeze, no hemoptysis no sputum.  However CT scan of the chest October 2019 when compared to 2011 is significantly worse with worsening infiltrates and right middle lobes collapse and also beginning stages of left upper lobe collapse.  Most of her decline seem to happen in the early years of the diagnosis of sarcoidosis between 2011 and 2015.  In 2014 she did have a CT scan of the chest at [facility].  I do not have the images to compare but it appears that most of the report from 2014 and the current 2019 report might just be the same or perhaps the infiltrates are worse.  It is unclear.  The best comparison might be chest x-ray to chest x-ray because the most recent chest x-ray was 2018.  Also pulmonary function testing is been a while since we did one.    CT dec 2014 at [place]  - unable to see image - local comparison [facility] 2012, 2014 CT chest 05/21/2018 at [facility] - comparison 2011[facility]  There is occlusion of the right middle lobe bronchus with atelectasis of the majority of the right middle lobe. There is also occlusion of the left upper lobe bronchus proximally with associated soft tissue, increased from prior. There is diffuse mosaic attenuation which likely represents air trapping.  Impression:  1. New 5 mm left upper lobe pulmonary nodule. 6-12 month followup is recommended. Other pulmonary nodules are stable.  2. Persistent occlusion of the right middle lobe bronchus proximally with near complete right middle lobe atelectasis. There is occlusion of the left upper lobe bronchus which is slightly increased from the prior study with adjacent soft tissue. Attention on followup is recommended.  Electronically Signed by:  Rocky Link, MD Electronically Signed on:  07/19/2013 1:29 PM  Mediastinum/Nodes: No mediastinal lymphadenopathy. Small mediastinal lymph nodes have subtle mineralization evident. Irregular Soft tissue fullness in the hilar regions is similar to prior scan. The esophagus has normal imaging features. There is no axillary lymphadenopathy.   Lungs/Pleura: The central tracheobronchial airways are patent. 7 mm left upper lobe pulmonary nodule is stable in the interval. Interval development of numerous, right greater than left, irregular bilateral pulmonary nodules. There is diffuse Perilymphatic thickening and nodularity, right greater than left, progressive in the interval with nodular thickening of the right major fissure progressive since prior study. These changes are associated with complete collapse of the right middle lobe. No pleural effusion  IMPRESSION: 1. Interval progression of bilateral perilymphatic thickening and nodularity, right greater than left. Imaging features are compatible with the patient's known history of sarcoidosis. 2. Interval development of complete collapse right middle lobe. Presumably also secondary to sarcoidosis and hilar involvement, but close follow-up recommended to exclude central obstructing lesion un related to sarcoidosis. 3.  Aortic Atherosclerois (ICD10-170.0)     Electronically Signed   By: Misty Stanley M.D.   On: 05/21/2018 09:54     OV 08/24/2018  Subjective:  Patient ID: [name], female , DOB: 04/08/1931 , age 36 y.o. , MRN: [id number] , ADDRESS: [address] [place] [facility] [address]   08/24/2018 -   Chief Complaint  Patient presents with   Follow-up  PFT performed 08/23/2018.  Pt states she has been doing well since last visit and denies any complaints.     HPI Marayah Higdon Punt 87 y.o. -+ returns for follow-up of her sarcoidosis with cough and middle lobe syndrome.  She is now on 5 mg prednisone.  She increase it from 4 mg prednisone because of cough.  With this the cough is well  controlled.  The issue more recently is that in 2019 she had a CT chest that shows progression of her sarcoid compared to 2011.  However in the interim she has had CT scans at [facility] in 2012 and 2014.  Ideally we would need to make a comparison to the most recent CT scan it appears at least based on reports the CT scans at [facility] in 2014 and [place] in 2019 might be similar.  However it is challenging to get radiology discs from outside institutions.  Therefore, we decided to look for progression with a pulmonary function test today which is essentially normal except for mild reduction in DLCO.  Incidentally, we try to get her chest x-ray image from primary care physician locally and so far we have not been able to do that.  Currently she is is also asymptomatic.  She is asymptomatic on 5 mg prednisone.   Results for ALEIGH, GRUNDEN (MRN [id number]) as of 08/24/2018 13:53  Ref. Range 08/23/2018 09:37  FEV1-Pre Latest Units: L 2.00  FEV1-%Pred-Pre Latest Units: % 127  Pre FEV1/FVC ratio Latest Units: % 72  Results for VERNELLA, NIZNIK (MRN [id number]) as of 08/24/2018 13:53  Ref. Range 08/23/2018 09:37  FEV1-Pre Latest Units: L 2.00  FEV1-%Pred-Pre Latest Units: % 127  Pre FEV1/FVC ratio Latest Units: % 72    OV 05/26/2019  Subjective:  Patient ID: [name], female , DOB: 1930/08/23 , age 23 y.o. , MRN: [id number] , ADDRESS: [address] [place] [facility] [address]   05/26/2019 -   Chief Complaint  Patient presents with   Follow-up    Pt states her breathing is doing well. Pt states her cough is mild. Pt states her cough is unchanged since last OV in 08/2018. Pt CP/tightness, f/c/s.    Follow-up sarcoidosis pulmonary with ocular sarcoid right middle lobe syndrome and chronic cough.  Previously on methotrexate.  Currently on prednisone 5 mg/day.  HPI SUMAYYAH CUSTODIO 87 y.o. -last seen January 2020.  Since then she has been social distancing.  She and her husband are  doing well.  She is currently on prednisone 5 mg/day and has very minimal cough.  RSI cough score is only 2 although she thinks maybe the cough is increased compared to last visit but the subjective score shows the reverse.  She has no other respiratory issues.  Specifically denies any wheezing or shortness of breath or hemoptysis or weight loss or night sweats or chills.  She swims and exercises.  She is up-to-date with her flu shot.  We specifically discussed about the fact that October 2019 CT scan of the chest showed progression compared to many years ago although we agree that this progression is not really progression if one were to compare 2012 and 2014 CT scans of the chest at [facility].  In other words the progression should have happened prior to 2012 or prior to 2014.  However we have been unable to get the CT scan CD-ROM from [facility].  She is having a CT angios neck coming up with Dr. Lanny Hurst  [place] of neurology/CT scan will be done at [facility].  She is interested in getting a CT scan of the chest if we can coincide the appointments.  She understands that if her sarcoidosis shows progression that she will not be interested in methotrexate or other immunomodulators.  She understands the risk of radiation.  However she is willing to undergo the CT scan to have a sense of a progression versus stability.        ROS - per HPI     OV 12/08/2019  Subjective:  Patient ID: [name], female , DOB: 07/12/1931 , age 87 y.o. 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  Previously on methotrexate.  Currently on prednisone 5 mg/day.  12/08/2019 -   Chief Complaint  Patient presents with   Follow-up    No complaints of SOB/ coughing or whezzing      HPI Riyana L Dhondt 87 y.o. -returns for follow-up for right middle lobe syndrome with chronic  cough.  Currently on prednisone 5 mg/day.  Last fall when she visited with me we had a CT scan of the chest that is shown improvement/stability on the report.  Currently she is without any cough.  She continues on 5 mg prednisone per day.  She does not want to lower the dose of the prednisone.  She is happy with this.  She also wants to continue with her fluticasone.  She wants refills of this.  She is up-to-date with the Covid vaccine.  She is going to have a jaw MRI because of jaw pain but otherwise is doing well.    ROS - per HPI  IMPRESSION: 1. Pulmonary parenchymal sarcoidosis, primarily improved. The only area of progression is in the central right lower lobe. 2. Slight improved appearance of the right middle lobe, with decrease in collapse/consolidation and airway compression. 3. No thoracic adenopathy. 4. Pulmonary artery enlargement suggests pulmonary arterial hypertension.     Electronically Signed   By: Abigail Miyamoto M.D.   On: 06/01/2019 14:58    OV 07/25/2020  Subjective:  Patient ID: [name], female , DOB: 26-Sep-1930 , age 47 y.o. , MRN: [id number] , ADDRESS: [address] [place] [address] PCP Burnard Bunting, MD Patient Care Team: Burnard Bunting, MD as PCP - General (Internal Medicine)  This Provider for this visit: Treatment Team:  Attending Provider: Brand Males, MD    07/25/2020 -   Chief Complaint  Patient presents with   Follow-up    Patient has been having trouble with heartburn and acid reflux, notices it more during the day when she is active. Prednisone change has helped.   Sarcoidosis with right middle lobe syndrome and chronic cough Has optic sarcoid as well  HPI Doreene L Abascal 87 y.o. -returns for follow-up.  Last seen in April 2021.  Last chest x-ray was in 2018 last CT scan of the chest was in 2020 with some improvement.  In April 2021 we reduced her prednisone to 4 mg/day but she called back saying there was a return  in cough so she went back up to prednisone 5 mg/day and this showed improvement in cough.  Currently cough is well controlled.  However in the last few months but subsequent increasing her prednisone back to her original baseline of 5 mg/day she is experiencing new heartburn with certain foods and bending over.  It is random.  It is not exertional.  She reportedly saw Dr. Acie Fredrickson sometime earlier this year ago in the last 1 to 2 years and was cleared from her cardiac standpoint.  Pepcid relieves this symptom.  There is no weight loss.    ROS - per HPI    OV 04/11/2021  Subjective:  Patient ID: [name], female , DOB: [birth year] , age 78 y.o. , MRN: [id number] , ADDRESS: [address] [place] [address] PCP Burnard Bunting, MD Patient Care Team: Burnard Bunting, MD as PCP - General (Internal Medicine)  This Provider for this visit: Treatment Team:  Attending Provider: Brand Males, MD    04/11/2021 -   Chief Complaint  Patient presents with   Follow-up    Pt states she would like to talk about problems she having with prednisone.    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 -returns for follow-up.  Last seen 9 months ago.  At that point in time I thought we resolved we will do 5 mg/day of prednisone because at 4 mg/day of prednisone her cough is returning.  We did a chest x-ray and a right middle lobe syndrome collapse had resolved compared to many years earlier.  The radiologist did not specifically comment about it.  It was my Sports coach opinion.  It appears in the interim she has had progressive issues with the right shoulder and unable to abduct it..  She is also in the last month or 2 having issues with the back pain.  She has seen Dr. Rhona Raider for this.  She has had MRI and apparently she might have cement placed.  She has a brace and this is helping her.  Based on  all this she is really nervous about continuing prednisone.  She ideally would like to come off prednisone slowly.  At this point in time because she is doing well on 4 mg/day of prednisone she wants to reduce it to 3 mg/day of prednisone but ultimately if possible work towards completely quitting prednisone.  She states her cough is resolved.  She is not having any visual problems at this point.  Not having any skin problems.  Her current cough score is documented below.    PFT  PFT Results Latest Ref Rng & Units 08/23/2018  FVC-Pre L 2.80  FVC-Predicted Pre % 130  Pre FEV1/FVC % % 72  FEV1-Pre L 2.00  FEV1-Predicted Pre % 127  DLCO uncorrected ml/min/mmHg 16.59  DLCO UNC% % 76  DLVA Predicted % 88   Narrative & Impression  CLINICAL DATA:  Sarcoidosis   EXAM: CHEST - 2 VIEW   COMPARISON:  Chest CT June 01, 2019   FINDINGS: No edema or airspace opacity evident. Noncalcified nodular opacity left upper lobe measuring 7 mm, seen on prior CT and stable. Calcified granuloma anteriorly, seen on lateral view. Chest CT indicates that this granuloma is in the left upper lobe. Heart size is within normal limits. There is prominence of the central pulmonary arteries with rapid peripheral tapering, likely indicating a degree of pulmonary arterial hypertension. No adenopathy appreciable by radiography. There is mild degenerative change in the thoracic spine. There is aortic atherosclerosis.   IMPRESSION: Apparent pulmonary arterial hypertension without cardiomegaly. No adenopathy appreciable by radiography. No edema or airspace opacity. Small calcified granuloma left upper lobe anteriorly. 7 mm nodular opacity left upper lobe, stable from prior  CT.   Aortic Atherosclerosis (ICD10-I70.0).     Electronically Signed   By: Lowella Grip III M.D.   On: 07/25/2020 10:09       OV 07/23/2021  Subjective:  Patient ID: [name], female , DOB: 12-16-1930 , age 53 y.o. ,  MRN: [id number] , ADDRESS: [address] [place] [address] PCP Burnard Bunting, MD Patient Care Team: Burnard Bunting, MD as PCP - General (Internal Medicine)  This Provider for this visit: Treatment Team:  Attending Provider: Brand Males, MD    07/23/2021 -   Chief Complaint  Patient presents with   Follow-up    Pt had surgery on her back 06/27/21. Pt states about 3-4 days before her surgery she fell and landed on her back. States that the surgery on her lower back went well.   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 -returns for follow-up.  After last visit we reduced her prednisone to 3mg /day.  She continues to do well without any respiratory issues.  Chest x-ray today shows continued resolution of the right middle lobe syndrome.  She is ready to reduce to 2 mg/day.  Of note chest x-ray today showed new onset T7 and T9 wedge compression fracture of the thoracic spine.  This is new compared to 1 year ago.  She told me that mid November 2022 she had lower back kyphoplasty but a few days before that she fell down.  Initially was reassured no imaging was done.  She says after the surgery she is now using walker and she has reduced mobility.  She is on hydrocodone all the time and pain is at least 6 out of 10.  Good control of bladder and bowel.  I have asked her to take this up with primary care physician and orthopod.    Dr Lorenza Cambridge Reflux Symptom Index (> 13-15 suggestive of LPR cough) Dec 2016 10/18/2015  04/22/2016  10/28/2016  01/13/2017  05/26/2019  04/11/2021 PRED 4MG   Hoarseness of problem with voice 2 3 3 1 1  0 1  Clearing  Of Throat 3 2 1 1  0 1 1  Excess throat mucus or feeling of post nasal drip 1 1 0 1 0 0 1  Difficulty swallowing food, liquid or tablets 2 1 0 1 0 0 0  Cough after eating or lying down 3 0 0 0 0 0 0  Breathing difficulties or choking episodes 1 0 0 0 2 0 0  Troublesome  or annoying cough 3 1 0 0 0 1 0  Sensation of something sticking in throat or lump in throat 0 1 0 0 0 0 0  Heartburn, chest pain, indigestion, or stomach acid coming up 3 1 2 1 1  0 1  TOTAL 18 10 6 5 3 2 4     CT Chest data  DG Chest 2 View  Result Date: 07/23/2021 CLINICAL DATA:  Sarcoidosis EXAM: CHEST - 2 VIEW COMPARISON:  07/25/2020 FINDINGS: The heart size and mediastinal contours are within normal limits. Both lungs are clear. Disc degenerative disease of the thoracic spine with multiple new wedge deformities of the mid to lower thoracic spine, approximately T7-T9. IMPRESSION: 1.  No acute abnormality of the lungs. 2. No radiographic evidence of pulmonary sarcoidosis, which is in general better assessed by CT. 3. Multiple new wedge deformities of the mid to lower thoracic spine, approximately T7 and T9, age indeterminate. Correlate for acute  pain and point tenderness. Electronically Signed   By: Delanna Ahmadi M.D.   On: 07/23/2021 09:21      PFT  PFT Results Latest Ref Rng & Units 08/23/2018  FVC-Pre L 2.80  FVC-Predicted Pre % 130  Pre FEV1/FVC % % 72  FEV1-Pre L 2.00  FEV1-Predicted Pre % 127  DLCO uncorrected ml/min/mmHg 16.59  DLCO UNC% % 76  DLVA Predicted % 88       has a past medical history of Alopecia (10/28/2016), Arthritis, Cervical spondylosis, Chest pain (07/28/2008), Chronic cough (07/12/2014), Collapsed lung ( 82/99), Complication of anesthesia, Cough (07/30/2010), Cystocele (05/23/2015), Cystocele, midline (05/02/2013), DEEP VEIN THROMBOSIS/PHLEBITIS (07/30/2010), Dislocation closed, shoulder (7/14), DVT (deep venous thrombosis) ([facility]) (2006), Encounter for therapeutic drug monitoring (10/03/2014), GERD (gastroesophageal reflux disease), Headache, High cholesterol, History of recurrent UTIs (09/24/2011), HYPERLIPIDEMIA (07/30/2010), Hypertension, HYPERTENSION (07/30/2010), Hypothyroidism, Nasal itching (01/30/2014), Osteopenia (05/23/2015), Peripheral vascular disease  ([facility]) (2006), Pleuritic pain (12/17/2015), PONV (postoperative nausea and vomiting), Primary localized osteoarthritis of right hip (08/26/2016), Primary osteoarthritis of right hip (08/26/2016), Pulmonary collapse (07/30/2010), PULMONARY NODULE (07/30/2010), Rash and nonspecific skin eruption (01/02/2015), Right middle lobe syndrome (01/30/2014), Sarcoidosis, Sarcoidosis of lung ([facility]), Vaginal atrophy (09/24/2011), and Visual field defect nasal step (01/02/2015).   reports that she has never smoked. She has never used smokeless tobacco.  Past Surgical History:  Procedure Laterality Date   EYE SURGERY     FOOT SURGERY Left 06/2005   bunion hammer toe   HEMORRHOID SURGERY  12/1972   INTRAOCULAR LENS INSERTION     right eye 10 /16 and left 06/16/2005   JOINT REPLACEMENT     KYPHOPLASTY N/A 06/27/2021   Procedure: LUMBAR 3 AND LUMBAR  5 KYPHOPLASTY;  Surgeon: Phylliss Bob, MD;  Location: [facility];  Service: Orthopedics;  Laterality: N/A;   SKIN BIOPSY  01/2007   basal cell carcinoma removed from right lower leg    TOTAL HIP ARTHROPLASTY  06/2007   left   TOTAL HIP ARTHROPLASTY Right 08/26/2016   Procedure: TOTAL HIP ARTHROPLASTY ANTERIOR APPROACH;  Surgeon: Melrose Nakayama, MD;  Location: [facility];  Service: Orthopedics;  Laterality: Right;   TOTAL KNEE ARTHROPLASTY Right 10/2007   right    Allergies  Allergen Reactions   Methotrexate Derivatives     Mild alopecia    Tylenol With Codeine #3 [Acetaminophen-Codeine]     Dizziness, brain fog, difficulty walking    Immunization History  Administered Date(s) Administered   DTaP 12/10/1995   Hepatitis A 12/10/1999   Influenza Split 04/11/2013, 04/11/2014   Influenza Whole 04/15/2010   Influenza, High Dose Seasonal PF 04/17/2018, 04/21/2019, 05/14/2020   Influenza, Quadrivalent, Recombinant, Inj, Pf 04/17/2018, 04/30/2019, 05/18/2021   Influenza,inj,Quad PF,6+ Mos 05/07/2015, 04/11/2016   Influenza-Unspecified 05/09/2017   Moderna SARS-COV2 Booster  Vaccination 06/13/2020   Moderna Sars-Covid-2 Vaccination 08/23/2019, 09/20/2019   Pneumococcal Conjugate-13 04/28/2017   Pneumococcal Polysaccharide-23 05/13/2004   Pneumococcal-Unspecified 08/11/2010, 03/11/2014   Td 02/08/2002   Tdap 08/11/2012   Zoster Recombinat (Shingrix) 12/30/2019, 02/29/2020   Zoster, Live 06/12/2007    Family History  Problem Relation Age of Onset   Diabetes Mother    Heart failure Mother    Hypertension Father    Diabetes Father    Heart disease Father    Heart attack Father      Current Outpatient Medications:    amLODipine (NORVASC) 10 MG tablet, Take 10 mg by mouth daily., Disp: , Rfl:    azelastine (OPTIVAR) 0.05 % ophthalmic solution, Place 1 drop into  both eyes 2 (two) times daily. , Disp: , Rfl:    Calcium Citrate-Vitamin D (CALCIUM + D PO), Take 1 tablet by mouth daily., Disp: , Rfl:    gemfibrozil (LOPID) 600 MG tablet, Take 600 mg by mouth 2 (two) times daily before a meal., Disp: , Rfl:    HYDROcodone-acetaminophen (NORCO/VICODIN) 5-325 MG tablet, Take 1 tablet by mouth every 6 (six) hours as needed., Disp: , Rfl:    levothyroxine (SYNTHROID, LEVOTHROID) 25 MCG tablet, Take 25 mcg by mouth daily. , Disp: , Rfl:    LORazepam (ATIVAN) 0.5 MG tablet, Take 0.5 mg by mouth daily as needed for anxiety or sleep., Disp: , Rfl:    losartan (COZAAR) 50 MG tablet, Take 50 mg by mouth daily., Disp: , Rfl:    Multiple Vitamin (MULTIVITAMIN) capsule, Take 1 capsule by mouth daily., Disp: , Rfl:    naproxen sodium (ALEVE) 220 MG tablet, Take 220 mg by mouth 2 (two) times daily as needed (pain)., Disp: , Rfl:    pantoprazole (PROTONIX) 20 MG tablet, Take 20 mg by mouth daily., Disp: , Rfl:    polyethylene glycol powder (GLYCOLAX/MIRALAX) 17 GM/SCOOP powder, Take 17 g by mouth at bedtime., Disp: , Rfl:    predniSONE (DELTASONE) 1 MG tablet, Take 3 tablets (3 mg total) by mouth daily with breakfast., Disp: 90 tablet, Rfl: 3      Objective:   Vitals:    07/23/21 0929  BP: 132/68  Pulse: 71  Temp: 98.1 F (36.7 C)  TempSrc: Oral  SpO2: 100%  Weight: 124 lb 12.8 oz (56.6 kg)  Height: 5\' 1"  (1.549 m)    Estimated body mass index is 23.58 kg/m as calculated from the following:   Height as of this encounter: 5\' 1"  (1.549 m).   Weight as of this encounter: 124 lb 12.8 oz (56.6 kg).  @WEIGHTCHANGE @  [employer]   07/23/21 0929  Weight: 124 lb 12.8 oz (56.6 kg)     Physical Exam  General: No distress.  Kyphotic.  Looks she has lost some weight.  Has a walker with her Neuro: Alert and Oriented x 3. GCS 15. Speech normal Psych: Pleasant Resp:  Barrel Chest - no.  Wheeze - no, Crackles - no, No overt respiratory distress CVS: Normal heart sounds. Murmurs - no Ext: Stigmata of Connective Tissue Disease - no HEENT: Normal upper airway. PEERL +. No post nasal drip        Assessment:       ICD-10-CM   1. Sarcoidosis  D86.9     2. Right middle lobe syndrome  J98.19     3. Chronic cough  R05.3     4. Closed wedge compression fracture of T9 vertebra, initial encounter ([facility])  S22.070A     5. Closed wedge compression fracture of T7 vertebra, initial encounter ([place])  S22.060A          Plan:     Patient Instructions   Sarcoidosis appears stable and CXR 07/23/2021 shows continued resolution of right mddle lobe syndrome. No sarcoid symptoms  Plan  - Reduce prednisone to 2 mg/day   T-spine wedge fracture - T7 and T9 Opioid requiring back pain with reduced mobility  - new since cxr dec 2021  - likely due to fall in Nov 2022  Plan  - discuss with PCP Burnard Bunting, MD or Dr Lynann Bologna    Follow-up Return in  3 months     SIGNATURE    Dr. Brand Males, M.D.,  F.C.C.P,  Pulmonary and Critical Care Medicine Staff Physician, [facility] Director - Interstitial Lung Disease  Program  [facility] [place], [place], [address]  Pager: [phone], If no answer or between  15:00h - 7:00h: call [phone] Telephone: [phone]  10:17 AM 07/23/2021

## 2021-07-23 NOTE — Patient Instructions (Addendum)
°  Sarcoidosis appears stable and CXR 07/23/2021 shows continued resolution of right mddle lobe syndrome. No sarcoid symptoms  Plan  - Reduce prednisone to 2 mg/day   T-spine wedge fracture - T7 and T9 Opioid requiring back pain with reduced mobility  - new since cxr dec 2021  - likely due to fall in Nov 2022  Plan  - discuss with PCP Burnard Bunting, MD or Dr Lynann Bologna    Follow-up Return in  3 months

## 2021-07-24 ENCOUNTER — Other Ambulatory Visit: Payer: Self-pay | Admitting: Orthopedic Surgery

## 2021-07-24 DIAGNOSIS — F419 Anxiety disorder, unspecified: Secondary | ICD-10-CM | POA: Diagnosis not present

## 2021-07-24 DIAGNOSIS — M6281 Muscle weakness (generalized): Secondary | ICD-10-CM | POA: Diagnosis not present

## 2021-07-24 DIAGNOSIS — R634 Abnormal weight loss: Secondary | ICD-10-CM | POA: Diagnosis not present

## 2021-07-24 DIAGNOSIS — I700 Atherosclerosis of aorta: Secondary | ICD-10-CM | POA: Diagnosis not present

## 2021-07-24 DIAGNOSIS — M8088XA Other osteoporosis with current pathological fracture, vertebra(e), initial encounter for fracture: Secondary | ICD-10-CM | POA: Diagnosis not present

## 2021-07-24 DIAGNOSIS — K219 Gastro-esophageal reflux disease without esophagitis: Secondary | ICD-10-CM | POA: Diagnosis not present

## 2021-07-24 DIAGNOSIS — D692 Other nonthrombocytopenic purpura: Secondary | ICD-10-CM | POA: Diagnosis not present

## 2021-07-24 DIAGNOSIS — I10 Essential (primary) hypertension: Secondary | ICD-10-CM | POA: Diagnosis not present

## 2021-07-24 DIAGNOSIS — Z4789 Encounter for other orthopedic aftercare: Secondary | ICD-10-CM | POA: Diagnosis not present

## 2021-07-24 DIAGNOSIS — R279 Unspecified lack of coordination: Secondary | ICD-10-CM | POA: Diagnosis not present

## 2021-07-24 DIAGNOSIS — M545 Low back pain, unspecified: Secondary | ICD-10-CM

## 2021-07-24 DIAGNOSIS — S32000A Wedge compression fracture of unspecified lumbar vertebra, initial encounter for closed fracture: Secondary | ICD-10-CM | POA: Diagnosis not present

## 2021-07-29 ENCOUNTER — Ambulatory Visit: Payer: Medicare PPO | Admitting: Family Medicine

## 2021-08-06 DIAGNOSIS — M546 Pain in thoracic spine: Secondary | ICD-10-CM | POA: Diagnosis not present

## 2021-08-07 ENCOUNTER — Telehealth: Payer: Self-pay | Admitting: Hematology and Oncology

## 2021-08-07 ENCOUNTER — Inpatient Hospital Stay: Payer: Medicare PPO

## 2021-08-07 ENCOUNTER — Inpatient Hospital Stay: Payer: Medicare PPO | Attending: Hematology and Oncology | Admitting: Hematology and Oncology

## 2021-08-07 ENCOUNTER — Other Ambulatory Visit: Payer: Self-pay

## 2021-08-07 VITALS — BP 167/69 | HR 71 | Temp 97.7°F | Resp 18 | Wt 126.7 lb

## 2021-08-07 DIAGNOSIS — D472 Monoclonal gammopathy: Secondary | ICD-10-CM | POA: Diagnosis not present

## 2021-08-07 DIAGNOSIS — Z800 Family history of malignant neoplasm of digestive organs: Secondary | ICD-10-CM | POA: Diagnosis not present

## 2021-08-07 DIAGNOSIS — M549 Dorsalgia, unspecified: Secondary | ICD-10-CM | POA: Diagnosis not present

## 2021-08-07 LAB — CMP (CANCER CENTER ONLY)
ALT: 14 U/L (ref 0–44)
AST: 21 U/L (ref 15–41)
Albumin: 3.7 g/dL (ref 3.5–5.0)
Alkaline Phosphatase: 104 U/L (ref 38–126)
Anion gap: 10 (ref 5–15)
BUN: 17 mg/dL (ref 8–23)
CO2: 21 mmol/L — ABNORMAL LOW (ref 22–32)
Calcium: 9.1 mg/dL (ref 8.9–10.3)
Chloride: 103 mmol/L (ref 98–111)
Creatinine: 0.68 mg/dL (ref 0.44–1.00)
GFR, Estimated: >60 mL/min (ref >60)
Glucose, Bld: 106 mg/dL — ABNORMAL HIGH (ref 70–99)
Potassium: 4.2 mmol/L (ref 3.5–5.1)
Sodium: 134 mmol/L — ABNORMAL LOW (ref 135–145)
Total Bilirubin: 0.6 mg/dL (ref 0.3–1.2)
Total Protein: 9.3 g/dL — ABNORMAL HIGH (ref 6.5–8.1)

## 2021-08-07 LAB — CBC WITH DIFFERENTIAL (CANCER CENTER ONLY)
Abs Immature Granulocytes: 0.01 10*3/uL (ref 0.00–0.07)
Basophils Absolute: 0 10*3/uL (ref 0.0–0.1)
Basophils Relative: 0 %
Eosinophils Absolute: 0 10*3/uL (ref 0.0–0.5)
Eosinophils Relative: 1 %
HCT: 29.3 % — ABNORMAL LOW (ref 36.0–46.0)
Hemoglobin: 9.8 g/dL — ABNORMAL LOW (ref 12.0–15.0)
Immature Granulocytes: 0 %
Lymphocytes Relative: 29 %
Lymphs Abs: 1.1 10*3/uL (ref 0.7–4.0)
MCH: 34.3 pg — ABNORMAL HIGH (ref 26.0–34.0)
MCHC: 33.4 g/dL (ref 30.0–36.0)
MCV: 102.4 fL — ABNORMAL HIGH (ref 80.0–100.0)
Monocytes Absolute: 0.3 10*3/uL (ref 0.1–1.0)
Monocytes Relative: 8 %
Neutro Abs: 2.4 10*3/uL (ref 1.7–7.7)
Neutrophils Relative %: 62 %
Platelet Count: 255 10*3/uL (ref 150–400)
RBC: 2.86 MIL/uL — ABNORMAL LOW (ref 3.87–5.11)
RDW: 13.6 % (ref 11.5–15.5)
WBC Count: 3.9 10*3/uL — ABNORMAL LOW (ref 4.0–10.5)
nRBC: 0 % (ref 0.0–0.2)

## 2021-08-07 LAB — LACTATE DEHYDROGENASE: LDH: 127 U/L (ref 98–192)

## 2021-08-07 NOTE — Telephone Encounter (Signed)
Scheduled appt per 12/21 referral. Pt is aware of appt date and time. Pt is aware to arrive 15 mins prior to appt time.

## 2021-08-07 NOTE — Progress Notes (Signed)
[place] [phone]) [phone]   Fax:([phone] [place] NOTE  Patient Care Team: Burnard Bunting, MD as PCP - General (Internal Medicine)  Hematological/Oncological History # IgG Lambda Monoclonal Gammopathy 06/04/2021: WBC 5.5, Hgb 11.1, MCV 101.3, Plt 306 07/24/2021: M protein 2.3, IFE shows IgG Lambda monoclonal specificity. Cr 0.9 08/07/2021: Establish care with Dr. Lorenso Courier  CHIEF COMPLAINTS/PURPOSE OF CONSULTATION:  "IgG Lambda Monoclonal Gammopathy "  HISTORY OF PRESENTING ILLNESS:  [name] Page 87 y.o. female with medical history significant for sarcoidosis, hyperlipidemia, hypertension, cervical spondylosis, DVT, peripheral vascular disease who presents for evaluation of monoclonal gammopathy with concern for multiple myeloma.  On review of the previous records [name] Page underwent blood work on 06/04/2021 which showed hemoglobin 0.1 with an MCV of 101.3.  On 07/24/2021 the patient underwent SPEP which showed M protein 2.3.  Due to her multiple spinal fractures and this blood work there was concern for multiple myeloma.  The patient was referred to hematology for further evaluation and management.  On exam today [name] Page notes that she is having issues with back pain.  She is taking hydrocodone and reports that her back pain is currently a 4 out of 10 in severity.  She reports she is normally quite active and that the recent bouts of being sedentary has been quite difficult for her.  She reports that she is a never smoker and very rarely drinks alcohol.  She previously worked as a Network engineer.  Her family history is markable for pancreatic cancer in a sister and leukemia in a paternal grandfather.  Other than her back pain she is not having any other focal symptoms.  She denies any fevers, chills, sweats, nausea, vomiting or diarrhea.  A full 10 point ROS is listed below.  MEDICAL HISTORY:  Past Medical History:  Diagnosis Date   Alopecia  10/28/2016   Arthritis    Cervical spondylosis    C3-4 AND C4-5   Chest pain 07/28/2008   H/O, normal stress nuclear EF 78%   Chronic cough 07/12/2014   Collapsed lung  12/11   being treated at [facility] - in left lung   Complication of anesthesia    Cough 07/30/2010   Qualifier: Diagnosis of  By: Chase Caller MD, Murali     Cystocele 05/23/2015   Cystocele, midline 05/02/2013   DEEP VEIN THROMBOSIS/PHLEBITIS 07/30/2010   Qualifier: History of  By: Harvest Dark CMA, Jennifer     Dislocation closed, shoulder 7/14   DVT (deep venous thrombosis) ([place]) 2006   Encounter for therapeutic drug monitoring 10/03/2014   GERD (gastroesophageal reflux disease)    occ   Headache    hx   High cholesterol    History of recurrent UTIs 09/24/2011   HYPERLIPIDEMIA 07/30/2010   Qualifier: Diagnosis of  By: Harvest Dark CMA, Jennifer     Hypertension    HYPERTENSION 07/30/2010   Qualifier: Diagnosis of  By: Harvest Dark CMA, Jennifer     Hypothyroidism    Nasal itching 01/30/2014   Osteopenia 05/23/2015   Peripheral vascular disease ([place]) 2006   dvt's and 50 yrs ago   Pleuritic pain 12/17/2015   PONV (postoperative nausea and vomiting)    Primary localized osteoarthritis of right hip 08/26/2016   Primary osteoarthritis of right hip 08/26/2016   Pulmonary collapse 07/30/2010   Qualifier: Diagnosis of  By: Chase Caller MD, Murali     PULMONARY NODULE 07/30/2010   Qualifier: Diagnosis of  By: Chase Caller MD, Murali     Rash and nonspecific  skin eruption 01/02/2015   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   Sarcoidosis    Sarcoidosis of lung ([place])    Vaginal atrophy 09/24/2011   Visual field defect nasal step 01/02/2015    SURGICAL HISTORY: Past Surgical History:  Procedure Laterality Date   EYE SURGERY     FOOT SURGERY Left 06/2005   bunion hammer toe   HEMORRHOID SURGERY  12/1972   INTRAOCULAR LENS INSERTION     right eye 10 /16 and left 06/16/2005   JOINT REPLACEMENT     KYPHOPLASTY N/A 06/27/2021   Procedure: LUMBAR  3 AND LUMBAR  5 KYPHOPLASTY;  Surgeon: Phylliss Bob, MD;  Location: [place];  Service: Orthopedics;  Laterality: N/A;   SKIN BIOPSY  01/2007   basal cell carcinoma removed from right lower leg    TOTAL HIP ARTHROPLASTY  06/2007   left   TOTAL HIP ARTHROPLASTY Right 08/26/2016   Procedure: TOTAL HIP ARTHROPLASTY ANTERIOR APPROACH;  Surgeon: Melrose Nakayama, MD;  Location: [facility];  Service: Orthopedics;  Laterality: Right;   TOTAL KNEE ARTHROPLASTY Right 10/2007   right    SOCIAL HISTORY: Social History   Socioeconomic History   Marital status: Married    Spouse name: Not on file   Number of children: Not on file   Years of education: Not on file   Highest education level: Not on file  Occupational History   Occupation: retired    Fish farm manager: RETIRED  Tobacco Use   Smoking status: Never   Smokeless tobacco: Never  Vaping Use   Vaping Use: Never used  Substance and Sexual Activity   Alcohol use: Yes    Comment: BEER/glass of wine A DAY   Drug use: No   Sexual activity: Never  Other Topics Concern   Not on file  Social History Narrative   Not on file   Social Determinants of Health   Financial Resource Strain: Not on file  Food Insecurity: Not on file  Transportation Needs: Not on file  Physical Activity: Not on file  Stress: Not on file  Social Connections: Not on file  Intimate Partner Violence: Not on file    FAMILY HISTORY: Family History  Problem Relation Age of Onset   Diabetes Mother    Heart failure Mother    Hypertension Father    Diabetes Father    Heart disease Father    Heart attack Father     ALLERGIES:  is allergic to ciprofloxacin, methotrexate derivatives, and tylenol with codeine #3 [acetaminophen-codeine].  MEDICATIONS:  Current Outpatient Medications  Medication Sig Dispense Refill   Cholecalciferol (VITAMIN D-1000 MAX ST) 25 MCG (1000 UT) tablet Take 1 tablet by mouth daily.     amLODipine (NORVASC) 10 MG tablet Take 10 mg by mouth daily.      azelastine (OPTIVAR) 0.05 % ophthalmic solution Place 1 drop into both eyes 2 (two) times daily.      Calcium Citrate-Vitamin D (CALCIUM + D PO) Take 1 tablet by mouth daily.     gemfibrozil (LOPID) 600 MG tablet Take 600 mg by mouth 2 (two) times daily before a meal.     HYDROcodone-acetaminophen (NORCO/VICODIN) 5-325 MG tablet Take 1 tablet by mouth every 6 (six) hours as needed.     levothyroxine (SYNTHROID, LEVOTHROID) 25 MCG tablet Take 25 mcg by mouth daily.      LORazepam (ATIVAN) 0.5 MG tablet Take 0.5 mg by mouth daily as needed for anxiety or sleep.     losartan (COZAAR) 50  MG tablet Take 50 mg by mouth daily.     Multiple Vitamin (MULTIVITAMIN) capsule Take 1 capsule by mouth daily.     naproxen sodium (ALEVE) 220 MG tablet Take 220 mg by mouth 2 (two) times daily as needed (pain).     pantoprazole (PROTONIX) 20 MG tablet Take 20 mg by mouth daily.     polyethylene glycol powder (GLYCOLAX/MIRALAX) 17 GM/SCOOP powder Take 17 g by mouth at bedtime.     predniSONE (DELTASONE) 1 MG tablet Take 3 tablets (3 mg total) by mouth daily with breakfast. 90 tablet 3   No current facility-administered medications for this visit.    REVIEW OF SYSTEMS:   Constitutional: ( - ) fevers, ( - )  chills , ( - ) night sweats Eyes: ( - ) blurriness of vision, ( - ) double vision, ( - ) watery eyes Ears, nose, mouth, throat, and face: ( - ) mucositis, ( - ) sore throat Respiratory: ( - ) cough, ( - ) dyspnea, ( - ) wheezes Cardiovascular: ( - ) palpitation, ( - ) chest discomfort, ( - ) lower extremity swelling Gastrointestinal:  ( - ) nausea, ( - ) heartburn, ( - ) change in bowel habits Skin: ( - ) abnormal skin rashes Lymphatics: ( - ) new lymphadenopathy, ( - ) easy bruising Neurological: ( - ) numbness, ( - ) tingling, ( - ) new weaknesses Behavioral/Psych: ( - ) mood change, ( - ) new changes  All other systems were reviewed with the patient and are negative.  PHYSICAL EXAMINATION: ECOG  PERFORMANCE STATUS: 2 - Symptomatic, <50% confined to bed  Vitals:   08/07/21 1519  BP: (!) 167/69  Pulse: 71  Resp: 18  Temp: 97.7 F (36.5 C)  SpO2: 99%   Filed Weights   08/07/21 1519  Weight: 126 lb 11.2 oz (57.5 kg)    GENERAL: well appearing elderly Caucasian female in NAD  SKIN: skin color, texture, turgor are normal, no rashes or significant lesions EYES: conjunctiva are pink and non-injected, sclera clear LUNGS: clear to auscultation and percussion with normal breathing effort HEART: regular rate & rhythm and no murmurs and no lower extremity edema Musculoskeletal: no cyanosis of digits and no clubbing  PSYCH: alert & oriented x 3, fluent speech NEURO: no focal motor/sensory deficits  LABORATORY DATA:  I have reviewed the data as listed CBC Latest Ref Rng & Units 06/04/2021 08/28/2016 08/18/2016  WBC 4.0 - 10.5 K/uL 5.5 6.4 5.5  Hemoglobin 12.0 - 15.0 g/dL 11.1(L) 9.5(L) 11.2(L)  Hematocrit 36.0 - 46.0 % 32.4(L) 27.4(L) 33.1(L)  Platelets 150 - 400 K/uL 306 188 246    CMP Latest Ref Rng & Units 06/04/2021 05/02/2019 08/28/2016  Glucose 70 - 99 mg/dL 106(H) 115(H) 117(H)  BUN 8 - 23 mg/dL '13 16 6  ' Creatinine 0.44 - 1.00 mg/dL 0.65 0.73 0.53  Sodium 135 - 145 mmol/L 132(L) 142 135  Potassium 3.5 - 5.1 mmol/L 3.4(L) 5.7(H) 4.4  Chloride 98 - 111 mmol/L 99 105 103  CO2 22 - 32 mmol/L '24 22 27  ' Calcium 8.9 - 10.3 mg/dL 9.3 10.0 8.6(L)  Total Protein 6.5 - 8.1 g/dL 8.3(H) 7.9 -  Total Bilirubin 0.3 - 1.2 mg/dL 0.7 0.4 -  Alkaline Phos 38 - 126 U/L 98 83 -  AST 15 - 41 U/L 19 20 -  ALT 0 - 44 U/L 13 10 -    RADIOGRAPHIC STUDIES: DG Chest 2 View  Result Date: 07/23/2021 CLINICAL  DATA:  Sarcoidosis EXAM: CHEST - 2 VIEW COMPARISON:  07/25/2020 FINDINGS: The heart size and mediastinal contours are within normal limits. Both lungs are clear. Disc degenerative disease of the thoracic spine with multiple new wedge deformities of the mid to lower thoracic spine,  approximately T7-T9. IMPRESSION: 1.  No acute abnormality of the lungs. 2. No radiographic evidence of pulmonary sarcoidosis, which is in general better assessed by CT. 3. Multiple new wedge deformities of the mid to lower thoracic spine, approximately T7 and T9, age indeterminate. Correlate for acute pain and point tenderness. Electronically Signed   By: Delanna Ahmadi M.D.   On: 07/23/2021 09:21    ASSESSMENT & PLAN [name] Page 87 y.o. female with medical history significant for sarcoidosis, hyperlipidemia, hypertension, cervical spondylosis, DVT, peripheral vascular disease who presents for evaluation of monoclonal gammopathy with concern for multiple myeloma.  After review of the labs, review of the records, and discussion with the patient the patients findings are most consistent with a monoclonal gammopathy highly suspicious for multiple myeloma.  The patient has numerous fractures in the spine which may be secondary to steroid use from her sarcoidosis though potentially may represent pathological fractures.  Her M protein is quite highly elevated at 2.3 and therefore I have higher suspicion of multiple myeloma.  We will form a full work-up to include SPEP, UPEP, serum free light chains, beta-2 microglobulin.  We will also order a bone survey to assess for lytic lesions and perform a bone marrow biopsy.  We will plan to see the patient back in approximately 3 weeks time in order to reassess.  We briefly discussed treatment options moving forward including double and triple therapy.  Despite her advanced age I do believe she would be a good candidate for at least doublet therapy.  We will discuss this in greater detail once the results of our blood work and other studies are complete.  # IgG Lambda Monoclonal Gammopathy, Concern for Multiple Myeloma  --today will order an SPEP, UPEP, SFLC and beta 2 microglobulin --additionally will collect new baseline CBC, CMP, and LDH --recommend a metastatic  bone survey to assess for lytic lesions --will order a bone marrow biopsy due to M protein >1.5 --Placeholder RTC in 3 weeks time to reassess.    No orders of the defined types were placed in this encounter.   All questions were answered. The patient knows to call the clinic with any problems, questions or concerns.  A total of more than 60 minutes were spent on this encounter with face-to-face time and non-face-to-face time, including preparing to see the patient, ordering tests and/or medications, counseling the patient and coordination of care as outlined above.   Ledell Peoples, MD Department of Hematology/Oncology [place] at [facility] Phone: [phone] Pager: [phone] Email: [email or web address]  08/07/2021 3:46 PM

## 2021-08-08 LAB — KAPPA/LAMBDA LIGHT CHAINS
Kappa free light chain: 11.7 mg/L (ref 3.3–19.4)
Kappa, lambda light chain ratio: 0.57 (ref 0.26–1.65)
Lambda free light chains: 20.5 mg/L (ref 5.7–26.3)

## 2021-08-08 LAB — BETA 2 MICROGLOBULIN, SERUM: Beta-2 Microglobulin: 2.3 mg/L (ref 0.6–2.4)

## 2021-08-13 DIAGNOSIS — M8589 Other specified disorders of bone density and structure, multiple sites: Secondary | ICD-10-CM | POA: Diagnosis not present

## 2021-08-13 DIAGNOSIS — Z780 Asymptomatic menopausal state: Secondary | ICD-10-CM | POA: Diagnosis not present

## 2021-08-13 LAB — MULTIPLE MYELOMA PANEL, SERUM
Albumin SerPl Elph-Mcnc: 3.5 g/dL (ref 2.9–4.4)
Albumin/Glob SerPl: 0.8 (ref 0.7–1.7)
Alpha 1: 0.3 g/dL (ref 0.0–0.4)
Alpha2 Glob SerPl Elph-Mcnc: 0.9 g/dL (ref 0.4–1.0)
B-Globulin SerPl Elph-Mcnc: 3.6 g/dL — ABNORMAL HIGH (ref 0.7–1.3)
Gamma Glob SerPl Elph-Mcnc: 0.2 g/dL — ABNORMAL LOW (ref 0.4–1.8)
Globulin, Total: 5 g/dL — ABNORMAL HIGH (ref 2.2–3.9)
IgA: 21 mg/dL — ABNORMAL LOW (ref 64–422)
IgG (Immunoglobin G), Serum: 2702 mg/dL — ABNORMAL HIGH (ref 586–1602)
IgM (Immunoglobulin M), Srm: 21 mg/dL — ABNORMAL LOW (ref 26–217)
M Protein SerPl Elph-Mcnc: 2.8 g/dL — ABNORMAL HIGH
Total Protein ELP: 8.5 g/dL (ref 6.0–8.5)

## 2021-08-14 ENCOUNTER — Ambulatory Visit (HOSPITAL_COMMUNITY)
Admission: RE | Admit: 2021-08-14 | Discharge: 2021-08-14 | Disposition: A | Discharge status: Home / Self Care | Payer: Medicare PPO | Source: Ambulatory Visit | Attending: Hematology and Oncology | Admitting: Hematology and Oncology

## 2021-08-14 ENCOUNTER — Other Ambulatory Visit: Payer: Self-pay

## 2021-08-14 DIAGNOSIS — D472 Monoclonal gammopathy: Secondary | ICD-10-CM | POA: Diagnosis not present

## 2021-08-15 ENCOUNTER — Ambulatory Visit
Admission: RE | Admit: 2021-08-15 | Discharge: 2021-08-15 | Disposition: A | Discharge status: Home / Self Care | Payer: Medicare PPO | Source: Ambulatory Visit | Attending: Orthopedic Surgery | Admitting: Orthopedic Surgery

## 2021-08-15 DIAGNOSIS — M25511 Pain in right shoulder: Secondary | ICD-10-CM | POA: Diagnosis not present

## 2021-08-15 DIAGNOSIS — M545 Low back pain, unspecified: Secondary | ICD-10-CM

## 2021-08-15 DIAGNOSIS — M549 Dorsalgia, unspecified: Secondary | ICD-10-CM | POA: Diagnosis not present

## 2021-08-15 DIAGNOSIS — M48061 Spinal stenosis, lumbar region without neurogenic claudication: Secondary | ICD-10-CM | POA: Diagnosis not present

## 2021-08-16 DIAGNOSIS — J309 Allergic rhinitis, unspecified: Secondary | ICD-10-CM | POA: Diagnosis not present

## 2021-08-16 DIAGNOSIS — I2510 Atherosclerotic heart disease of native coronary artery without angina pectoris: Secondary | ICD-10-CM | POA: Diagnosis not present

## 2021-08-16 DIAGNOSIS — K219 Gastro-esophageal reflux disease without esophagitis: Secondary | ICD-10-CM | POA: Diagnosis not present

## 2021-08-16 DIAGNOSIS — E785 Hyperlipidemia, unspecified: Secondary | ICD-10-CM | POA: Diagnosis not present

## 2021-08-16 DIAGNOSIS — I10 Essential (primary) hypertension: Secondary | ICD-10-CM | POA: Diagnosis not present

## 2021-08-16 DIAGNOSIS — D860 Sarcoidosis of lung: Secondary | ICD-10-CM | POA: Diagnosis not present

## 2021-08-16 DIAGNOSIS — F419 Anxiety disorder, unspecified: Secondary | ICD-10-CM | POA: Diagnosis not present

## 2021-08-16 DIAGNOSIS — E039 Hypothyroidism, unspecified: Secondary | ICD-10-CM | POA: Diagnosis not present

## 2021-08-16 DIAGNOSIS — J8410 Pulmonary fibrosis, unspecified: Secondary | ICD-10-CM | POA: Diagnosis not present

## 2021-08-16 LAB — UPEP/UIFE/LIGHT CHAINS/TP, 24-HR UR
% BETA, Urine: 0 %
ALPHA 1 URINE: 0 %
Albumin, U: 100 %
Alpha 2, Urine: 0 %
Free Kappa Lt Chains,Ur: 2.8 mg/L (ref 1.17–86.46)
Free Kappa/Lambda Ratio: >4.18 (ref 1.83–14.26)
Free Lambda Lt Chains,Ur: <0.67 mg/L (ref 0.27–15.21)
GAMMA GLOBULIN URINE: 0 %
Total Protein, Urine-Ur/day: 205 mg/24 hr — ABNORMAL HIGH (ref 30–150)
Total Protein, Urine: 8.2 mg/dL
Total Volume: 2500

## 2021-08-19 DIAGNOSIS — M546 Pain in thoracic spine: Secondary | ICD-10-CM | POA: Diagnosis not present

## 2021-08-27 ENCOUNTER — Telehealth: Payer: Self-pay | Admitting: Hematology and Oncology

## 2021-08-27 ENCOUNTER — Other Ambulatory Visit: Payer: Self-pay | Admitting: Orthopedic Surgery

## 2021-08-27 NOTE — Telephone Encounter (Signed)
Sch per 1/16 inbasket, R/s appt per pt req, pt aware.

## 2021-08-28 NOTE — Progress Notes (Signed)
Surgical Instructions    Your procedure is scheduled on 09/04/21.  Report to [facility] Main Entrance "A" at 9:30 A.M., then check in with the Admitting office.  Call this number if you have problems the morning of surgery:  [phone]   If you have any questions prior to your surgery date call [phone]: Open Monday-Friday 8am-4pm    Remember:  Do not eat after midnight the night before your surgery  You may drink clear liquids until 9:30am the morning of your surgery.   Clear liquids allowed are: Water, Non-Citrus Juices (without pulp), Carbonated Beverages, Clear Tea, Black Coffee ONLY (NO MILK, CREAM OR POWDERED CREAMER of any kind), and Gatorade  Patient Instructions  The night before surgery:  No food after midnight. ONLY clear liquids after midnight  The day of surgery (if you do NOT have diabetes):  Drink ONE (1) Pre-Surgery Clear Ensure by 9:30am the morning of surgery. Drink in one sitting. Do not sip.  This drink was given to you during your hospital  pre-op appointment visit. Nothing else to drink after completing the  Pre-Surgery Clear Ensure.           If you have questions, please contact your surgeons office.     Take these medicines the morning of surgery with A SIP OF WATER  amLODipine (NORVASC) azelastine (OPTIVAR) gemfibrozil (LOPID) levothyroxine (SYNTHROID, LEVOTHROID) pantoprazole (PROTONIX) predniSONE (DELTASONE)   IF NEEDED: HYDROcodone-acetaminophen (NORCO/VICODIN)  LORazepam (ATIVAN)   As of today, STOP taking any Aspirin (unless otherwise instructed by your surgeon) Aleve, Naproxen, Ibuprofen, Motrin, Advil, Goody's, BC's, all herbal medications, fish oil, and all vitamins.  After your COVID test   You are not required to quarantine however you are required to wear a well-fitting mask when you are out and around people not in your household.  If your mask becomes wet or soiled, replace with a new one.  Wash your hands often with  soap and water for 20 seconds or clean your hands with an alcohol-based hand sanitizer that contains at least 60% alcohol.  Do not share personal items.  Notify your provider: if you are in close contact with someone who has COVID  or if you develop a fever of 100.4 or greater, sneezing, cough, sore throat, shortness of breath or body aches.           Do not wear jewelry or makeup Do not wear lotions, powders, perfumes/colognes, or deodorant. Do not shave 48 hours prior to surgery.   Do not bring valuables to the hospital. DO Not wear nail polish, gel polish, artificial nails, or any other type of covering on natural nails (fingers and toes) If you have artificial nails or gel coating that need to be removed by a nail salon, please have this removed prior to surgery. Artificial nails or gel coating may interfere with anesthesia's ability to adequately monitor your vital signs.             [facility] is not responsible for any belongings or valuables.  Do NOT Smoke (Tobacco/Vaping)  24 hours prior to your procedure  If you use a CPAP at night, you may bring your mask for your overnight stay.   Contacts, glasses, hearing aids, dentures or partials may not be worn into surgery, please bring cases for these belongings   For patients admitted to the hospital, discharge time will be determined by your treatment team.   Patients discharged the day of surgery will not be allowed to drive  home, and someone needs to stay with them for 24 hours.  NO VISITORS WILL BE ALLOWED IN PRE-OP WHERE PATIENTS ARE PREPPED FOR SURGERY.  ONLY 1 SUPPORT PERSON MAY BE PRESENT IN THE WAITING ROOM WHILE YOU ARE IN SURGERY.  IF YOU ARE TO BE ADMITTED, ONCE YOU ARE IN YOUR ROOM YOU WILL BE ALLOWED TWO (2) VISITORS. 1 (ONE) VISITOR MAY STAY OVERNIGHT BUT MUST ARRIVE TO THE ROOM BY 8pm.  Minor children may have two parents present. Special consideration for safety and communication needs will be reviewed on a case by  case basis.  Special instructions:    Oral Hygiene is also important to reduce your risk of infection.  Remember - BRUSH YOUR TEETH THE MORNING OF SURGERY WITH YOUR REGULAR TOOTHPASTE   [place]- Preparing For Surgery  Before surgery, you can play an important role. Because skin is not sterile, your skin needs to be as free of germs as possible. You can reduce the number of germs on your skin by washing with CHG (chlorahexidine gluconate) Soap before surgery.  CHG is an antiseptic cleaner which kills germs and bonds with the skin to continue killing germs even after washing.     Please do not use if you have an allergy to CHG or antibacterial soaps. If your skin becomes reddened/irritated stop using the CHG.  Do not shave (including legs and underarms) for at least 48 hours prior to first CHG shower. It is OK to shave your face.  Please follow these instructions carefully.     Shower the NIGHT BEFORE SURGERY and the MORNING OF SURGERY with CHG Soap.   If you chose to wash your hair, wash your hair first as usual with your normal shampoo. After you shampoo, rinse your hair and body thoroughly to remove the shampoo.  Then [employer] and genitals (private parts) with your normal soap and rinse thoroughly to remove soap.  After that Use CHG Soap as you would any other liquid soap. You can apply CHG directly to the skin and wash gently with a scrungie or a clean washcloth.   Apply the CHG Soap to your body ONLY FROM THE NECK DOWN.  Do not use on open wounds or open sores. Avoid contact with your eyes, ears, mouth and genitals (private parts). Wash Face and genitals (private parts)  with your normal soap.   Wash thoroughly, paying special attention to the area where your surgery will be performed.  Thoroughly rinse your body with warm water from the neck down.  DO NOT shower/wash with your normal soap after using and rinsing off the CHG Soap.  Pat yourself dry with a CLEAN TOWEL.  Wear  CLEAN PAJAMAS to bed the night before surgery  Place CLEAN SHEETS on your bed the night before your surgery  DO NOT SLEEP WITH PETS.   Day of Surgery: Take a shower with CHG soap. Wear Clean/Comfortable clothing the morning of surgery Do not apply any deodorants/lotions.   Remember to brush your teeth WITH YOUR REGULAR TOOTHPASTE.   Please read over the following fact sheets that you were given.

## 2021-08-29 ENCOUNTER — Encounter (HOSPITAL_COMMUNITY)
Admission: RE | Admit: 2021-08-29 | Discharge: 2021-08-29 | Disposition: A | Discharge status: Home / Self Care | Payer: Medicare PPO | Source: Ambulatory Visit | Attending: Orthopedic Surgery | Admitting: Orthopedic Surgery

## 2021-08-29 ENCOUNTER — Other Ambulatory Visit: Payer: Self-pay

## 2021-08-29 ENCOUNTER — Encounter (HOSPITAL_COMMUNITY): Payer: Self-pay

## 2021-08-29 ENCOUNTER — Telehealth: Payer: Self-pay | Admitting: Internal Medicine

## 2021-08-29 VITALS — BP 140/60 | HR 71 | Temp 98.4°F | Resp 17 | Ht 61.0 in | Wt 123.3 lb

## 2021-08-29 DIAGNOSIS — Z7952 Long term (current) use of systemic steroids: Secondary | ICD-10-CM | POA: Diagnosis not present

## 2021-08-29 DIAGNOSIS — I4510 Unspecified right bundle-branch block: Secondary | ICD-10-CM | POA: Diagnosis not present

## 2021-08-29 DIAGNOSIS — D869 Sarcoidosis, unspecified: Secondary | ICD-10-CM | POA: Insufficient documentation

## 2021-08-29 DIAGNOSIS — J9819 Other pulmonary collapse: Secondary | ICD-10-CM | POA: Diagnosis not present

## 2021-08-29 DIAGNOSIS — Z01812 Encounter for preprocedural laboratory examination: Secondary | ICD-10-CM | POA: Insufficient documentation

## 2021-08-29 DIAGNOSIS — Z01818 Encounter for other preprocedural examination: Secondary | ICD-10-CM

## 2021-08-29 DIAGNOSIS — D6489 Other specified anemias: Secondary | ICD-10-CM | POA: Diagnosis not present

## 2021-08-29 HISTORY — DX: Angina pectoris, unspecified: I20.9

## 2021-08-29 LAB — CBC
HCT: 28.5 % — ABNORMAL LOW (ref 36.0–46.0)
Hemoglobin: 9.4 g/dL — ABNORMAL LOW (ref 12.0–15.0)
MCH: 34.6 pg — ABNORMAL HIGH (ref 26.0–34.0)
MCHC: 33 g/dL (ref 30.0–36.0)
MCV: 104.8 fL — ABNORMAL HIGH (ref 80.0–100.0)
Platelets: 296 10*3/uL (ref 150–400)
RBC: 2.72 MIL/uL — ABNORMAL LOW (ref 3.87–5.11)
RDW: 13.9 % (ref 11.5–15.5)
WBC: 3.5 10*3/uL — ABNORMAL LOW (ref 4.0–10.5)
nRBC: 0 % (ref 0.0–0.2)

## 2021-08-29 LAB — SURGICAL PCR SCREEN
MRSA, PCR: NEGATIVE
Staphylococcus aureus: POSITIVE — AB

## 2021-08-29 NOTE — Progress Notes (Signed)
Abnormal CBC in PAT; Dr. Lynann Bologna office was called with results.

## 2021-08-29 NOTE — Telephone Encounter (Signed)
OV notes and clearance form have been faxed back to [facility]. Nothing further needed at this time.  

## 2021-08-29 NOTE — Telephone Encounter (Signed)
° ° ° ° °  1) RISK FOR PROLONGED MECHANICAL VENTILAION - > 48h  1A) Arozullah - Prolonged mech ventilation risk Arozullah Postperative Pulmonary Risk Score - for mech ventilation dependence >48h [employer], Ann Surg 2000, major non-cardiac surgery) Comment Score  Type of surgery - abd ao aneurysm (27), thoracic (21), neurosurgery / upper abdominal / vascular (21), neck (11) Thoraci kyphoplasty 0  Emergency Surgery - (11) no 0  ALbumin < 3 or poor nutritional state - (9) no 0  BUN > 30 -  (8) no 0  Partial or completely dependent functional status - (7) no 0  COPD -  (6) Sarcoid, prednisone 6  Age - 67 to 65 (4), > 70  (6) Age  88 6  TOTAL  12  Risk Stratifcation scores  - < 10 (0.5%), 11-19 (1.8%), 20-27 (4.2%), 28-40 (10.1%), >40 (26.6%)  Low moderate    Overall risk from thoracic kyphoplsty is low-moderate or moderate in terms of prolonged mechanical ventilation post surgery  Plan  -  1) RISK FOR PROLONGED MECHANICAL VENTILAION - > 48h   Risk ameliorating factors are normal functiona and nutritional status and lack of opening incisions into the abdomen thoracic cage in the neck..  Risk inducing factors of sarcoidosis prednisone and age.   Major Pulmonary risks identified in the multifactorial risk analysis are but not limited to a) pneumonia; b) recurrent intubation risk; c) prolonged or recurrent acute respiratory failure needing mechanical ventilation; d) prolonged hospitalization; e) DVT/Pulmonary embolism; f) Acute Pulmonary edema  Recommend 1. Short duration of surgery as much as possible and avoid paralytic if possible 2. Recovery in step down or ICU with Pulmonary consultation 3. DVT prophylaxis 4. Aggressive pulmonary toilet with o2, bronchodilatation, and incentive spirometry and early ambulation 5.  Recommend stress dose steroids if needed      SIGNATURE    Dr. Brand Males, M.D., F.C.C.P,  Pulmonary and Critical Care Medicine Staff Physician, [place] Director - Interstitial Lung Disease  Program  Pulmonary [place] at [place], [place], [address]  NPI Number:  NPI #[id number]  Pager: [phone], If no answer  -> Check AMION or Try [phone] Telephone (clinical office): [phone] Telephone (research): [phone]  12:58 PM 08/29/2021

## 2021-08-29 NOTE — Telephone Encounter (Signed)
Fax received from Dr. Phylliss Bob with [facility] to perform a Thoracic 7, 8, 9 10 kyphoplasty on patient.  Patient needs surgery clearance. Patient was seen on 07/23/2021. Office protocol is a risk assessment can be sent to surgeon if patient has been seen in 60 days or less.   Sending to Dr. Chase Caller for risk assessment

## 2021-08-29 NOTE — Progress Notes (Addendum)
PCP - Burnard Bunting, MD Cardiologist - Nahser, Arnette Norris, MD  PPM/ICD - denies Device Orders - n/a Rep Notified - n/a  Chest x-ray - n/a EKG - 06/04/2021 Stress Test - 07/28/2008 ECHO - denies Cardiac Cath - denies  Sleep Study - denies CPAP - n/a  Fasting Blood Sugar - n/a  Blood Thinner Instructions: n/a  Aspirin Instructions: Patient was instructed: As of today, STOP taking any Aspirin (unless otherwise instructed by your surgeon) Aleve, Naproxen, Ibuprofen, Motrin, Advil, Goody's, BC's, all herbal medications, fish oil, and all vitamins.  ERAS Protcol - yes PRE-SURGERY Ensure - yes, until 09:30  COVID TEST- no - ambulatory surgery   Anesthesia review: yes - Sarcoidosis; cardiac history - see Dr. Elmarie Shiley note - 09/20/2019; abnormal CBC in PAT (DR. Dumonski notified)  Patient denies shortness of breath, fever, cough and chest pain at PAT appointment   All instructions explained to the patient, with a verbal understanding of the material. Patient agrees to go over the instructions while at home for a better understanding. Patient also instructed to self quarantine after being tested for COVID-19. The opportunity to ask questions was provided.

## 2021-08-30 ENCOUNTER — Inpatient Hospital Stay: Payer: Medicare PPO | Admitting: Hematology and Oncology

## 2021-08-30 ENCOUNTER — Inpatient Hospital Stay: Payer: Medicare PPO

## 2021-08-30 NOTE — Anesthesia Preprocedure Evaluation (Addendum)
Anesthesia Evaluation  Patient identified by MRN, date of birth, ID band Patient awake    Reviewed: Allergy & Precautions, NPO status , Patient's Chart, lab work & pertinent test results  History of Anesthesia Complications (+) PONV  Airway Mallampati: II  TM Distance: >3 FB Neck ROM: Full    Dental no notable dental hx.    Pulmonary  sarcoidosis    + decreased breath sounds      Cardiovascular hypertension, Pt. on medications + Peripheral Vascular Disease and + DVT   Rhythm:Regular Rate:Normal     Neuro/Psych  Headaches, negative psych ROS   GI/Hepatic Neg liver ROS, GERD  Medicated,  Endo/Other  Hypothyroidism   Renal/GU negative Renal ROS  negative genitourinary   Musculoskeletal  (+) Arthritis , Osteoarthritis,  T7-T10 compression fx   Abdominal Normal abdominal exam  (+)   Peds  Hematology negative hematology ROS (+)   Anesthesia Other Findings   Reproductive/Obstetrics                            Anesthesia Physical Anesthesia Plan  ASA: 3  Anesthesia Plan:    Post-op Pain Management:    Induction:   PONV Risk Score and Plan: Ondansetron, Dexamethasone and Treatment may vary due to age or medical condition  Airway Management Planned: Mask and Oral ETT  Additional Equipment: None  Intra-op Plan:   Post-operative Plan: Extubation in OR  Informed Consent: I have reviewed the patients History and Physical, chart, labs and discussed the procedure including the risks, benefits and alternatives for the proposed anesthesia with the patient or authorized representative who has indicated his/her understanding and acceptance.     Dental advisory given  Plan Discussed with: CRNA  Anesthesia Plan Comments: (PAT note by Karoline Caldwell, PA-C: Follows with pulmonologist Dr. Chase Caller for sarcoidosis.  Per notes, she has associated mild right middle lobe collapse along with ocular  sarcoid.  Her main symptom is severe chronic cough with high RSI score suggestive of irritable larynx syndrome as well.  She was last seen 07/23/2021 and sarcoidosis was noted to be stable by CXR.  She is maintained on prednisone 2 mg daily. She was cleared for surgery per telephone encounter 08/29/21 stating, "Overall risk from thoracic kyphoplsty is low-moderate or moderate in terms of prolonged mechanical ventilation post surgery."  Patient was evaluated by cardiologist Dr. Acie Fredrickson 09/20/2019 for episode of syncope ultimately felt to be vasovagal.  Per note, "Vasovagal syncope: The patient presents today for further evaluation of an episode of syncope. The syncope occurred with the side of blood. She is extremely healthy for 88 years old and exercises 6 days a week. She routinely works out on the [employer] all and also swims in the pool. She is never had any episodes of syncope or presyncope during or after exercise. Her EKG is stable showing normal sinus rhythm with a right bundle branch block. Her exam is normal. She does not have any significant murmurs. At this point I am not inclined to pursue any additional work-up. I've encourage her to call us back if she has any additional problems.  We will see her on an as needed basis."  Patient recently underwent lumbar 3 and lumbar 5 kyphoplasty on 32/67/1245 without complication.  CMP from 07/30/2021 reviewed, sodium only mildly low 134, otherwise unremarkable.  CBC 08/29/2021 showed stable chronic anemia with hemoglobin 9.4.  Hemoglobin (g/dL) Date Value 08/29/2021 9.4 (L) 08/07/2021 9.8 (L) 06/04/2021 11.1 (  L) 08/28/2016 9.5 (L) 08/18/2016 11.2 (L)  EKG 06/04/2021: NSR.  Rate 83.  Right bundle branch block.  Read states lateral T wave abnormality, however, difficult to assess given artifact.  CHEST - 2 VIEW 07/23/21:  COMPARISON: 07/25/2020  FINDINGS: The heart size and mediastinal contours are within normal limits. Both  lungs are clear. Disc degenerative disease of the thoracic spine with multiple new wedge deformities of the mid to lower thoracic spine, approximately T7-T9.  IMPRESSION: 1. No acute abnormality of the lungs.  2. No radiographic evidence of pulmonary sarcoidosis, which is in general better assessed by CT.  3. Multiple new wedge deformities of the mid to lower thoracic spine, approximately T7 and T9, age indeterminate. Correlate for acute pain and point tenderness.  )       Anesthesia Quick Evaluation

## 2021-08-30 NOTE — Progress Notes (Addendum)
Anesthesia Chart Review:  Follows with pulmonologist Dr. Chase Caller for sarcoidosis.  Per notes, she has associated mild right middle lobe collapse along with ocular sarcoid.  Her main symptom is severe chronic cough with high RSI score suggestive of irritable larynx syndrome as well.  She was last seen 07/23/2021 and sarcoidosis was noted to be stable by CXR.  She is maintained on prednisone 2 mg daily. She was cleared for surgery per telephone encounter 08/29/21 stating, "Overall risk from thoracic kyphoplsty is low-moderate or moderate in terms of prolonged mechanical ventilation post surgery."  Patient was evaluated by cardiologist Dr. Acie Fredrickson 09/20/2019 for episode of syncope ultimately felt to be vasovagal.  Per note, "Vasovagal syncope: The patient presents today for further evaluation of an episode of syncope.  The syncope occurred with the side of blood.  She is extremely healthy for 88 years old and exercises 6 days a week.  She routinely works out on the [employer] all and also swims in the pool.  She is never had any episodes of syncope or presyncope during or after exercise.  Her EKG is stable showing normal sinus rhythm with a right bundle branch block. Her exam is normal.  She does not have any significant murmurs.  At this point I am not inclined to pursue any additional work-up.   I've encourage her to call us back if she has any additional problems.  We will see her on an as needed basis ."  Patient recently underwent lumbar 3 and lumbar 5 kyphoplasty on 17/61/6073 without complication.  CMP from 07/30/2021 reviewed, sodium only mildly low 134, otherwise unremarkable.  CBC 08/29/2021 showed stable chronic anemia with hemoglobin 9.4.  Hemoglobin (g/dL)  Date Value  08/29/2021 9.4 (L)  08/07/2021 9.8 (L)  06/04/2021 11.1 (L)  08/28/2016 9.5 (L)  08/18/2016 11.2 (L)   EKG 06/04/2021: NSR.  Rate 83.  Right bundle branch block.  Read states lateral T wave abnormality, however,  difficult to assess given artifact.  CHEST - 2 VIEW 07/23/21:   COMPARISON:  07/25/2020   FINDINGS: The heart size and mediastinal contours are within normal limits. Both lungs are clear. Disc degenerative disease of the thoracic spine with multiple new wedge deformities of the mid to lower thoracic spine, approximately T7-T9.   IMPRESSION: 1.  No acute abnormality of the lungs.   2. No radiographic evidence of pulmonary sarcoidosis, which is in general better assessed by CT.   3. Multiple new wedge deformities of the mid to lower thoracic spine, approximately T7 and T9, age indeterminate. Correlate for acute pain and point tenderness.     Wynonia Musty [facility] Short Stay Center/Anesthesiology Phone [phone] 08/30/2021 4:34 PM

## 2021-09-03 ENCOUNTER — Telehealth: Payer: Self-pay | Admitting: Hematology and Oncology

## 2021-09-03 NOTE — Telephone Encounter (Signed)
Scheduled per 12/28 los, patient has been called and notified of upcoming appointment.

## 2021-09-04 ENCOUNTER — Other Ambulatory Visit: Payer: Self-pay

## 2021-09-04 ENCOUNTER — Ambulatory Visit (HOSPITAL_COMMUNITY): Payer: Medicare PPO | Admitting: Anesthesiology

## 2021-09-04 ENCOUNTER — Ambulatory Visit (HOSPITAL_COMMUNITY): Payer: Medicare PPO

## 2021-09-04 ENCOUNTER — Ambulatory Visit (HOSPITAL_COMMUNITY)
Admission: RE | Admit: 2021-09-04 | Discharge: 2021-09-04 | Disposition: A | Discharge status: Home / Self Care | Payer: Medicare PPO | Source: Ambulatory Visit | Attending: Orthopedic Surgery | Admitting: Orthopedic Surgery

## 2021-09-04 ENCOUNTER — Encounter (HOSPITAL_COMMUNITY)
Admission: RE | Disposition: A | Discharge status: Home / Self Care | Payer: Self-pay | Source: Ambulatory Visit | Attending: Orthopedic Surgery

## 2021-09-04 ENCOUNTER — Encounter (HOSPITAL_COMMUNITY): Payer: Self-pay | Admitting: Orthopedic Surgery

## 2021-09-04 ENCOUNTER — Ambulatory Visit (HOSPITAL_COMMUNITY): Payer: Medicare PPO | Admitting: Physician Assistant

## 2021-09-04 DIAGNOSIS — M8008XA Age-related osteoporosis with current pathological fracture, vertebra(e), initial encounter for fracture: Secondary | ICD-10-CM | POA: Insufficient documentation

## 2021-09-04 DIAGNOSIS — K219 Gastro-esophageal reflux disease without esophagitis: Secondary | ICD-10-CM | POA: Insufficient documentation

## 2021-09-04 DIAGNOSIS — Z86718 Personal history of other venous thrombosis and embolism: Secondary | ICD-10-CM | POA: Insufficient documentation

## 2021-09-04 DIAGNOSIS — E039 Hypothyroidism, unspecified: Secondary | ICD-10-CM | POA: Diagnosis not present

## 2021-09-04 DIAGNOSIS — I739 Peripheral vascular disease, unspecified: Secondary | ICD-10-CM | POA: Diagnosis not present

## 2021-09-04 DIAGNOSIS — Z8739 Personal history of other diseases of the musculoskeletal system and connective tissue: Secondary | ICD-10-CM | POA: Diagnosis not present

## 2021-09-04 DIAGNOSIS — I10 Essential (primary) hypertension: Secondary | ICD-10-CM | POA: Insufficient documentation

## 2021-09-04 DIAGNOSIS — S22000A Wedge compression fracture of unspecified thoracic vertebra, initial encounter for closed fracture: Secondary | ICD-10-CM | POA: Diagnosis not present

## 2021-09-04 DIAGNOSIS — M4854XA Collapsed vertebra, not elsewhere classified, thoracic region, initial encounter for fracture: Secondary | ICD-10-CM | POA: Diagnosis not present

## 2021-09-04 DIAGNOSIS — Z419 Encounter for procedure for purposes other than remedying health state, unspecified: Secondary | ICD-10-CM

## 2021-09-04 HISTORY — PX: KYPHOPLASTY: SHX5884

## 2021-09-04 SURGERY — KYPHOPLASTY
Anesthesia: General

## 2021-09-04 MED ORDER — PROPOFOL 500 MG/50ML IV EMUL
INTRAVENOUS | Status: DC | PRN
  Administered 2021-09-04: 125 ug/kg/min via INTRAVENOUS

## 2021-09-04 MED ORDER — BACITRACIN ZINC 500 UNIT/GM EX OINT
TOPICAL_OINTMENT | CUTANEOUS | Status: AC
  Filled 2021-09-04: qty 28.35

## 2021-09-04 MED ORDER — LACTATED RINGERS IV SOLN: INTRAVENOUS | Status: DC

## 2021-09-04 MED ORDER — CEFAZOLIN SODIUM-DEXTROSE 2-4 GM/100ML-% IV SOLN
2.0000 g | INTRAVENOUS | Status: AC
  Administered 2021-09-04: 14:00:00 2 g via INTRAVENOUS
  Filled 2021-09-04: qty 100

## 2021-09-04 MED ORDER — SUGAMMADEX SODIUM 200 MG/2ML IV SOLN
INTRAVENOUS | Status: DC | PRN
  Administered 2021-09-04: 200 mg via INTRAVENOUS

## 2021-09-04 MED ORDER — ROCURONIUM BROMIDE 10 MG/ML (PF) SYRINGE
PREFILLED_SYRINGE | INTRAVENOUS | Status: DC | PRN
Start: 2021-09-04 — End: 2021-09-04
  Administered 2021-09-04: 20 mg via INTRAVENOUS
  Administered 2021-09-04: 50 mg via INTRAVENOUS

## 2021-09-04 MED ORDER — FENTANYL CITRATE (PF) 100 MCG/2ML IJ SOLN
INTRAMUSCULAR | Status: AC
  Filled 2021-09-04: qty 2

## 2021-09-04 MED ORDER — POVIDONE-IODINE 7.5 % EX SOLN: Freq: Once | CUTANEOUS | Status: DC

## 2021-09-04 MED ORDER — BUPIVACAINE-EPINEPHRINE (PF) 0.25% -1:200000 IJ SOLN
INTRAMUSCULAR | Status: AC
  Filled 2021-09-04: qty 30

## 2021-09-04 MED ORDER — FENTANYL CITRATE (PF) 250 MCG/5ML IJ SOLN
INTRAMUSCULAR | Status: DC | PRN
  Administered 2021-09-04: 100 ug via INTRAVENOUS
  Administered 2021-09-04: 50 ug via INTRAVENOUS

## 2021-09-04 MED ORDER — ONDANSETRON HCL 4 MG/2ML IJ SOLN
INTRAMUSCULAR | Status: DC | PRN
  Administered 2021-09-04: 4 mg via INTRAVENOUS

## 2021-09-04 MED ORDER — ROCURONIUM BROMIDE 10 MG/ML (PF) SYRINGE
PREFILLED_SYRINGE | INTRAVENOUS | Status: AC
  Filled 2021-09-04: qty 10

## 2021-09-04 MED ORDER — DEXAMETHASONE SODIUM PHOSPHATE 10 MG/ML IJ SOLN
INTRAMUSCULAR | Status: DC | PRN
  Administered 2021-09-04: 5 mg via INTRAVENOUS

## 2021-09-04 MED ORDER — PHENYLEPHRINE 40 MCG/ML (10ML) SYRINGE FOR IV PUSH (FOR BLOOD PRESSURE SUPPORT)
PREFILLED_SYRINGE | INTRAVENOUS | Status: DC | PRN
  Administered 2021-09-04: 160 ug via INTRAVENOUS
  Administered 2021-09-04: 80 ug via INTRAVENOUS

## 2021-09-04 MED ORDER — IOPAMIDOL (ISOVUE-300) INJECTION 61%
INTRAVENOUS | Status: DC | PRN
  Administered 2021-09-04: 15:00:00 100 mL

## 2021-09-04 MED ORDER — PROPOFOL 10 MG/ML IV BOLUS
INTRAVENOUS | Status: DC | PRN
  Administered 2021-09-04: 130 mg via INTRAVENOUS

## 2021-09-04 MED ORDER — LIDOCAINE 2% (20 MG/ML) 5 ML SYRINGE
INTRAMUSCULAR | Status: AC
  Filled 2021-09-04: qty 5

## 2021-09-04 MED ORDER — LIDOCAINE 2% (20 MG/ML) 5 ML SYRINGE
INTRAMUSCULAR | Status: DC | PRN
Start: 2021-09-04 — End: 2021-09-04
  Administered 2021-09-04: 60 mg via INTRAVENOUS

## 2021-09-04 MED ORDER — ORAL CARE MOUTH RINSE: 15.0000 mL | Freq: Once | OROMUCOSAL | Status: AC

## 2021-09-04 MED ORDER — GLYCOPYRROLATE 0.2 MG/ML IJ SOLN
INTRAMUSCULAR | Status: DC | PRN
  Administered 2021-09-04: .1 mg via INTRAVENOUS

## 2021-09-04 MED ORDER — METHOCARBAMOL 500 MG PO TABS: 500.0000 mg | ORAL_TABLET | Freq: Four times a day (QID) | ORAL | 0 refills | Status: DC | PRN

## 2021-09-04 MED ORDER — FENTANYL CITRATE (PF) 100 MCG/2ML IJ SOLN
25.0000 ug | INTRAMUSCULAR | Status: DC | PRN
  Administered 2021-09-04 (×2): 25 ug via INTRAVENOUS

## 2021-09-04 MED ORDER — PHENYLEPHRINE HCL-NACL 20-0.9 MG/250ML-% IV SOLN
INTRAVENOUS | Status: DC | PRN
  Administered 2021-09-04: 40 ug/min via INTRAVENOUS

## 2021-09-04 MED ORDER — OXYCODONE-ACETAMINOPHEN 5-325 MG PO TABS: 1.0000 | ORAL_TABLET | ORAL | 0 refills | Status: AC | PRN

## 2021-09-04 MED ORDER — 0.9 % SODIUM CHLORIDE (POUR BTL) OPTIME
TOPICAL | Status: DC | PRN
  Administered 2021-09-04: 14:00:00 1000 mL

## 2021-09-04 MED ORDER — FENTANYL CITRATE (PF) 250 MCG/5ML IJ SOLN
INTRAMUSCULAR | Status: AC
  Filled 2021-09-04: qty 5

## 2021-09-04 MED ORDER — PROPOFOL 10 MG/ML IV BOLUS
INTRAVENOUS | Status: AC
  Filled 2021-09-04: qty 20

## 2021-09-04 MED ORDER — CHLORHEXIDINE GLUCONATE 0.12 % MT SOLN
15.0000 mL | Freq: Once | OROMUCOSAL | Status: AC
  Administered 2021-09-04: 10:00:00 15 mL via OROMUCOSAL
  Filled 2021-09-04: qty 15

## 2021-09-04 SURGICAL SUPPLY — 50 items
BAG COUNTER SPONGE SURGICOUNT (BAG) ×2 IMPLANT
BAG SPNG CNTER NS LX DISP (BAG) ×1
BLADE SURG 15 STRL LF DISP TIS (BLADE) ×1 IMPLANT
BLADE SURG 15 STRL SS (BLADE) ×2
CEMENT KYPHON C01A KIT/MIXER (Cement) ×1 IMPLANT
COVER MAYO STAND STRL (DRAPES) ×2 IMPLANT
COVER SURGICAL LIGHT HANDLE (MISCELLANEOUS) ×1 IMPLANT
CURETTE EXPRESS SZ2 7MM (INSTRUMENTS) IMPLANT
CURRETTE EXPRESS SZ2 7MM (INSTRUMENTS)
DRAPE C-ARM 42X72 X-RAY (DRAPES) ×3 IMPLANT
DRAPE HALF SHEET 40X57 (DRAPES) ×1 IMPLANT
DRAPE INCISE IOBAN 66X45 STRL (DRAPES) ×2 IMPLANT
DRAPE LAPAROTOMY T 102X78X121 (DRAPES) ×2 IMPLANT
DRAPE SURG 17X23 STRL (DRAPES) ×8 IMPLANT
DRAPE WARM FLUID 44X44 (DRAPES) ×2 IMPLANT
DURAPREP 26ML APPLICATOR (WOUND CARE) ×2 IMPLANT
GAUZE 4X4 16PLY ~~LOC~~+RFID DBL (SPONGE) ×3 IMPLANT
GAUZE SPONGE 2X2 8PLY STRL LF (GAUZE/BANDAGES/DRESSINGS) ×1 IMPLANT
GLOVE SRG 8 PF TXTR STRL LF DI (GLOVE) ×1 IMPLANT
GLOVE SURG ENC MOIS LTX SZ6.5 (GLOVE) ×2 IMPLANT
GLOVE SURG ENC MOIS LTX SZ8 (GLOVE) ×2 IMPLANT
GLOVE SURG UNDER POLY LF SZ7 (GLOVE) ×4 IMPLANT
GLOVE SURG UNDER POLY LF SZ8 (GLOVE) ×2
GOWN STRL REUS W/ TWL LRG LVL3 (GOWN DISPOSABLE) ×2 IMPLANT
GOWN STRL REUS W/ TWL XL LVL3 (GOWN DISPOSABLE) ×1 IMPLANT
GOWN STRL REUS W/TWL LRG LVL3 (GOWN DISPOSABLE) ×4
GOWN STRL REUS W/TWL XL LVL3 (GOWN DISPOSABLE) ×2
INTRODUCER DEVICE OSTEO LEVEL (INTRODUCER) ×1 IMPLANT
KIT BASIN OR (CUSTOM PROCEDURE TRAY) ×2 IMPLANT
KIT OSTEOCOOL BONE ACCESS 8G (MISCELLANEOUS) ×2 IMPLANT
KIT TURNOVER KIT B (KITS) ×2 IMPLANT
NDL HYPO 25X1 1.5 SAFETY (NEEDLE) ×1 IMPLANT
NDL SPNL 18GX3.5 QUINCKE PK (NEEDLE) ×2 IMPLANT
NEEDLE 22X1 1/2 (OR ONLY) (NEEDLE) IMPLANT
NEEDLE HYPO 25X1 1.5 SAFETY (NEEDLE) ×2 IMPLANT
NEEDLE SPNL 18GX3.5 QUINCKE PK (NEEDLE) ×4 IMPLANT
NS IRRIG 1000ML POUR BTL (IV SOLUTION) ×2 IMPLANT
PACK UNIVERSAL I (CUSTOM PROCEDURE TRAY) ×2 IMPLANT
PAD ARMBOARD 7.5X6 YLW CONV (MISCELLANEOUS) ×4 IMPLANT
POSITIONER HEAD PRONE TRACH (MISCELLANEOUS) ×1 IMPLANT
SPONGE GAUZE 2X2 STER 10/PKG (GAUZE/BANDAGES/DRESSINGS) ×1
SUT MNCRL AB 4-0 PS2 18 (SUTURE) ×3 IMPLANT
SYR BULB IRRIG 60ML STRL (SYRINGE) ×2 IMPLANT
SYR CONTROL 10ML LL (SYRINGE) ×2 IMPLANT
TAMP BONE INFLATABLE 10/3 (BALLOONS) ×4 IMPLANT
TAPE CLOTH SURG 4X10 WHT LF (GAUZE/BANDAGES/DRESSINGS) ×1 IMPLANT
TOWEL GREEN STERILE (TOWEL DISPOSABLE) ×2 IMPLANT
TOWEL GREEN STERILE FF (TOWEL DISPOSABLE) ×2 IMPLANT
TRAY KYPHOPAK 15/3 ONESTEP 1ST (MISCELLANEOUS) ×2 IMPLANT
TRAY KYPHOPAK 20/3 ONESTEP 1ST (MISCELLANEOUS) IMPLANT

## 2021-09-04 NOTE — Progress Notes (Signed)
Patient given warm blankets, resting comfortably.  Husband updated.

## 2021-09-04 NOTE — Transfer of Care (Signed)
Immediate Anesthesia Transfer of Care Note  Patient: [name] Page  Procedure(s) Performed: THORACIC SEVEN, THORACIC EIGHT, THORACIC NINE, THORACIC TEN KYPHOPLASTY  Patient Location: PACU  Anesthesia Type:General  Level of Consciousness: drowsy  Airway & Oxygen Therapy: Patient Spontanous Breathing  Post-op Assessment: Report given to RN and Post -op Vital signs reviewed and stable  Post vital signs: Reviewed and stable  Last Vitals:  Vitals Value Taken Time  BP 139/61 09/04/21 1557  Temp    Pulse 72 09/04/21 1558  Resp 17 09/04/21 1558  SpO2 98 % 09/04/21 1558  Vitals shown include unvalidated device data.  Last Pain:  Vitals:   09/04/21 0951  TempSrc:   PainSc: 0-No pain         Complications: No notable events documented.

## 2021-09-04 NOTE — OR Nursing (Signed)
Kyphon HV-R Radiopaque bone cement expires 01/09/24 lot number [id number] used during procedure by Dr Lynann Bologna

## 2021-09-04 NOTE — Anesthesia Procedure Notes (Signed)
Procedure Name: Intubation Date/Time: 09/04/2021 2:06 PM Performed by: Vonna Drafts, CRNA Pre-anesthesia Checklist: Patient identified, Emergency Drugs available, Suction available and Patient being monitored Patient Re-evaluated:Patient Re-evaluated prior to induction Oxygen Delivery Method: Circle system utilized Preoxygenation: Pre-oxygenation with 100% oxygen Induction Type: IV induction Ventilation: Mask ventilation without difficulty Laryngoscope Size: Mac and 4 Grade View: Grade II Tube type: Oral Tube size: 7.0 mm Number of attempts: 1 Airway Equipment and Method: Stylet and Oral airway Placement Confirmation: ETT inserted through vocal cords under direct vision, positive ETCO2 and breath sounds checked- equal and bilateral Secured at: 21 cm Tube secured with: Tape Dental Injury: Teeth and Oropharynx as per pre-operative assessment

## 2021-09-04 NOTE — Op Note (Signed)
PATIENT NAME: [name] Page   MEDICAL RECORD NO.:   [id number]    DATE OF BIRTH: 05/28/1931   DATE OF PROCEDURE: 09/04/2021                               OPERATIVE REPORT   PREOPERATIVE DIAGNOSIS:  Osteoporotic T7, T8, T9, and T10 compression fractures (M80.08XA)  POSTOPERATIVE DIAGNOSIS:  Osteoporotic T7, T8, T9, and T10 compression fractures (M80.08XA)  PROCEDURE:  T7, T8, T9, and T10 kyphoplasty.  SURGEON:  Phylliss Bob, MD.  ASSISTANTPricilla Holm, PA-C  ANESTHESIA:  General endotracheal anesthesia.  COMPLICATIONS:  None.  DISPOSITION:  Stable.  ESTIMATED BLOOD LOSS:  Minimal.  INDICATIONS FOR SURGERY:  Briefly, [name] is a very pleasant 88 y.o.- year-old female, who did have an onset of pain in her mid back.   Her pain was rather severe.  The patient's imaging studies did clearly reveal osteoporotic T7, T8, T9, and T10 compression fractures. We did attempt a trial of nonoperative treatment, but the patient continued to feel rather debilitated as a result of her ongoing pain.  Given her ongoing pain and dysfunction, we did discuss proceeding with the procedure noted above.  I did fully discuss the procedure with the patient, and she did wish to proceed.  OPERATIVE DETAILS:  On 09/04/2021, the patient was brought to surgery and general endotracheal anesthesia was administered.  The patient was placed prone on a well-padded flat Jackson bed with gel rolls placed under the patient's chest and hips.  Antibiotics were given.  AP and lateral fluoroscopy was brought into the field.  The T7, T8, T9, and T10  pedicles were marked out in the usual fashion.  After a time-out procedure was performed, I did advance Jamshidis across the T7, T8, T9, and T10  pedicles on the right and left sides.  I then drilled through the Jamshidis.  I then inserted kyphoplasty balloons and I was able to inflate the balloons with approximately 3cc of contrast. The balloons were deflated  then removed.  At this point, after the cement was mixed, a total of approximately 4cc of cement was injected through the right and left cannulas at each fractured level,  half on the right, and half on the left.  Excellent interdigitation of cement was identified at each level.  No extravasation was noted near any neurologic structures.  There was very slight extravasation through the lateral aspect of T7.  This was minimal, and was not in the vicinity of any significant vascular structure or neurologic structure. The cement was allowed to harden, after which point the Jamshidis were removed.  The wound  was then irrigated and closed using 4-0 Monocryl.  Bacitracin and a sterile dressing were applied.  The patient was then awoken from general endotracheal anesthesia and transferred to recovery in stable condition.  Phylliss Bob, MD

## 2021-09-04 NOTE — H&P (Signed)
PREOPERATIVE H&P  Chief Complaint: Mid back pain  HPI: [name] Page is a 88 y.o. female who presents with ongoing pain in the mid back  MRI reveals compression fractures at T7,8,9, and 10  Patient has failed multiple forms of conservative care and continues to have pain (see office notes for additional details regarding the patient's full course of treatment)  Past Medical History:  Diagnosis Date   Alopecia 10/28/2016   Anginal pain ([place])    Arthritis    Cervical spondylosis    C3-4 AND C4-5   Chest pain 07/28/2008   H/O, normal stress nuclear EF 78%   Chronic cough 07/12/2014   Collapsed lung 07/2010   being treated at [facility] - in left lung   Complication of anesthesia    Cough 07/30/2010   Qualifier: Diagnosis of  By: Chase Caller MD, Murali     Cystocele 05/23/2015   Cystocele, midline 05/02/2013   DEEP VEIN THROMBOSIS/PHLEBITIS 07/30/2010   Qualifier: History of  By: Harvest Dark CMA, Jennifer     Dislocation closed, shoulder 02/2013   DVT (deep venous thrombosis) ([place]) 2006   Encounter for therapeutic drug monitoring 10/03/2014   GERD (gastroesophageal reflux disease)    occ   Headache    hx   High cholesterol    History of recurrent UTIs 09/24/2011   HYPERLIPIDEMIA 07/30/2010   Qualifier: Diagnosis of  By: Harvest Dark CMA, Jennifer     Hypertension    HYPERTENSION 07/30/2010   Qualifier: Diagnosis of  By: Harvest Dark CMA, Jennifer     Hypothyroidism    Nasal itching 01/30/2014   Osteopenia 05/23/2015   Peripheral vascular disease ([place]) 2006   dvt's and 50 yrs ago   Pleuritic pain 12/17/2015   PONV (postoperative nausea and vomiting)    Primary localized osteoarthritis of right hip 08/26/2016   Primary osteoarthritis of right hip 08/26/2016   Pulmonary collapse 07/30/2010   Qualifier: Diagnosis of  By: Chase Caller MD, Murali     PULMONARY NODULE 07/30/2010   Qualifier: Diagnosis of  By: Chase Caller MD, Murali     Rash and nonspecific skin eruption 01/02/2015    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   Sarcoidosis    Sarcoidosis of lung ([place])    Vaginal atrophy 09/24/2011   Visual field defect nasal step 01/02/2015   Past Surgical History:  Procedure Laterality Date   BACK SURGERY  2022   EYE SURGERY     FOOT SURGERY Left 06/2005   bunion hammer toe   HEMORRHOID SURGERY  12/1972   INTRAOCULAR LENS INSERTION     right eye 10 /16 and left 06/16/2005   JOINT REPLACEMENT     KYPHOPLASTY N/A 06/27/2021   Procedure: LUMBAR 3 AND LUMBAR  5 KYPHOPLASTY;  Surgeon: Phylliss Bob, MD;  Location: [facility];  Service: Orthopedics;  Laterality: N/A;   SKIN BIOPSY  01/2007   basal cell carcinoma removed from right lower leg    TOTAL HIP ARTHROPLASTY  06/2007   left   TOTAL HIP ARTHROPLASTY Right 08/26/2016   Procedure: TOTAL HIP ARTHROPLASTY ANTERIOR APPROACH;  Surgeon: Melrose Nakayama, MD;  Location: [place];  Service: Orthopedics;  Laterality: Right;   TOTAL KNEE ARTHROPLASTY Right 10/2007   right   Social History   Socioeconomic History   Marital status: Married    Spouse name: Not on file   Number of children: Not on file   Years of education: Not on file   Highest education level: Not on file  Occupational History   Occupation: retired    Fish farm manager: RETIRED  Tobacco Use   Smoking status: Never   Smokeless tobacco: Never  Vaping Use   Vaping Use: Never used  Substance and Sexual Activity   Alcohol use: Not Currently    Comment: BEER/glass of wine A DAY   Drug use: No   Sexual activity: Never  Other Topics Concern   Not on file  Social History Narrative   Not on file   Social Determinants of Health   Financial Resource Strain: Not on file  Food Insecurity: Not on file  Transportation Needs: Not on file  Physical Activity: Not on file  Stress: Not on file  Social Connections: Not on file   Family History  Problem Relation Age of Onset   Diabetes Mother    Heart failure Mother    Hypertension Father    Diabetes Father    Heart  disease Father    Heart attack Father    Allergies  Allergen Reactions   Ciprofloxacin Other (See Comments)   Methotrexate Derivatives     Mild alopecia    Tylenol With Codeine #3 [Acetaminophen-Codeine]     Dizziness, brain fog, difficulty walking   Prior to Admission medications   Medication Sig Start Date End Date Taking? Authorizing Provider  amLODipine (NORVASC) 10 MG tablet Take 10 mg by mouth daily.   Yes [provider]  azelastine (OPTIVAR) 0.05 % ophthalmic solution Place 1 drop into both eyes 2 (two) times daily.    Yes [provider]  Calcium Citrate-Vitamin D (CALCIUM + D PO) Take 1 tablet by mouth daily.   Yes [provider]  Cholecalciferol (VITAMIN D-1000 MAX ST) 25 MCG (1000 UT) tablet Take 1,000 Units by mouth daily. 07/02/09  Yes [provider]  gemfibrozil (LOPID) 600 MG tablet Take 600 mg by mouth 2 (two) times daily before a meal.   Yes [provider]  HYDROcodone-acetaminophen (NORCO/VICODIN) 5-325 MG tablet Take 1 tablet by mouth every 6 (six) hours as needed for moderate pain. 07/10/21  Yes [provider]  levothyroxine (SYNTHROID, LEVOTHROID) 25 MCG tablet Take 25 mcg by mouth daily.    Yes [provider]  LORazepam (ATIVAN) 0.5 MG tablet Take 0.5 mg by mouth daily as needed for anxiety or sleep. 05/14/21  Yes [provider]  losartan (COZAAR) 50 MG tablet Take 50 mg by mouth daily.   Yes [provider]  Multiple Vitamin (MULTIVITAMIN) capsule Take 1 capsule by mouth daily.   Yes [provider]  naproxen sodium (ALEVE) 220 MG tablet Take 220 mg by mouth 2 (two) times daily as needed (pain).   Yes [provider]  pantoprazole (PROTONIX) 20 MG tablet Take 20 mg by mouth daily.   Yes [provider]  PERCOCET 10-325 MG tablet Take 1 tablet by mouth every 6 (six) hours as needed for pain. 08/21/21  Yes [provider]  polyethylene glycol powder  (GLYCOLAX/MIRALAX) 17 GM/SCOOP powder Take 17 g by mouth at bedtime. 07/26/19  Yes [provider]  predniSONE (DELTASONE) 1 MG tablet Take 3 tablets (3 mg total) by mouth daily with breakfast. Patient taking differently: Take 2 mg by mouth daily with breakfast. 04/11/21  Yes Brand Males, MD     All other systems have been reviewed and were otherwise negative with the exception of those mentioned in the HPI and as above.  Physical Exam: Vitals:   09/04/21 0931  BP: (!) 178/64  Pulse: 68  Resp: 18  Temp: 97.7 F (36.5 C)  SpO2: 100%    Body mass index is 23.3 kg/m.  General: Alert, no acute distress Cardiovascular: No pedal edema Respiratory: No cyanosis, no use of accessory musculature Skin: No lesions in the area of chief complaint Neurologic: Sensation intact distally Psychiatric: Patient is competent for consent with normal mood and affect Lymphatic: No axillary or cervical lymphadenopathy   Assessment/Plan: Subacute osteoporotic thoracic compression fractures involving T7, T8, T9, and T10 Plan for Procedure(s): THORACIC 7, THORACIC 8, THORACIC 9, THORACIC 10 KYPHOPLASTY   Norva Karvonen, MD 09/04/2021 10:39 AM

## 2021-09-05 ENCOUNTER — Encounter (HOSPITAL_COMMUNITY): Payer: Self-pay | Admitting: Orthopedic Surgery

## 2021-09-05 NOTE — Anesthesia Postprocedure Evaluation (Signed)
Anesthesia Post Note  Patient: [name] Page  Procedure(s) Performed: THORACIC SEVEN, THORACIC EIGHT, THORACIC NINE, THORACIC TEN KYPHOPLASTY     Patient location during evaluation: PACU Anesthesia Type: General Level of consciousness: awake and alert Pain management: pain level controlled Vital Signs Assessment: post-procedure vital signs reviewed and stable Respiratory status: spontaneous breathing, nonlabored ventilation, respiratory function stable and patient connected to nasal cannula oxygen Cardiovascular status: blood pressure returned to baseline and stable Postop Assessment: no apparent nausea or vomiting Anesthetic complications: no   No notable events documented.  Last Vitals:  Vitals:   09/04/21 1725 09/04/21 1740  BP: (!) 147/63 130/77  Pulse: 83 77  Resp: 20 19  Temp:  36.7 C  SpO2: 96% 95%    Last Pain:  Vitals:   09/04/21 1730  TempSrc:   PainSc: 3                  Kaisyn Millea P Brexley Cutshaw

## 2021-09-06 ENCOUNTER — Other Ambulatory Visit (HOSPITAL_COMMUNITY): Payer: Medicare PPO

## 2021-09-06 ENCOUNTER — Ambulatory Visit (HOSPITAL_COMMUNITY): Payer: Medicare PPO

## 2021-09-13 DIAGNOSIS — Z9889 Other specified postprocedural states: Secondary | ICD-10-CM | POA: Diagnosis not present

## 2021-09-13 DIAGNOSIS — M546 Pain in thoracic spine: Secondary | ICD-10-CM | POA: Diagnosis not present

## 2021-09-16 ENCOUNTER — Ambulatory Visit: Payer: Medicare PPO | Admitting: Hematology and Oncology

## 2021-09-16 ENCOUNTER — Other Ambulatory Visit: Payer: Medicare PPO

## 2021-09-19 ENCOUNTER — Other Ambulatory Visit: Payer: Self-pay | Admitting: Internal Medicine

## 2021-09-19 NOTE — Telephone Encounter (Signed)
Please advise patient requesting refill °

## 2021-09-26 ENCOUNTER — Other Ambulatory Visit: Payer: Self-pay | Admitting: Student

## 2021-09-26 ENCOUNTER — Other Ambulatory Visit: Payer: Self-pay | Admitting: Radiology

## 2021-09-27 ENCOUNTER — Other Ambulatory Visit: Payer: Self-pay

## 2021-09-27 ENCOUNTER — Encounter (HOSPITAL_COMMUNITY): Payer: Self-pay

## 2021-09-27 ENCOUNTER — Ambulatory Visit (HOSPITAL_COMMUNITY)
Admission: RE | Admit: 2021-09-27 | Discharge: 2021-09-27 | Disposition: A | Discharge status: Home / Self Care | Payer: Medicare PPO | Source: Ambulatory Visit | Attending: Hematology and Oncology | Admitting: Hematology and Oncology

## 2021-09-27 DIAGNOSIS — D860 Sarcoidosis of lung: Secondary | ICD-10-CM | POA: Diagnosis not present

## 2021-09-27 DIAGNOSIS — D472 Monoclonal gammopathy: Secondary | ICD-10-CM | POA: Insufficient documentation

## 2021-09-27 DIAGNOSIS — C9000 Multiple myeloma not having achieved remission: Secondary | ICD-10-CM | POA: Diagnosis not present

## 2021-09-27 DIAGNOSIS — Q978 Other specified sex chromosome abnormalities, female phenotype: Secondary | ICD-10-CM | POA: Diagnosis not present

## 2021-09-27 DIAGNOSIS — D539 Nutritional anemia, unspecified: Secondary | ICD-10-CM | POA: Diagnosis not present

## 2021-09-27 DIAGNOSIS — Z86718 Personal history of other venous thrombosis and embolism: Secondary | ICD-10-CM | POA: Diagnosis not present

## 2021-09-27 DIAGNOSIS — D72819 Decreased white blood cell count, unspecified: Secondary | ICD-10-CM | POA: Diagnosis not present

## 2021-09-27 LAB — CBC WITH DIFFERENTIAL/PLATELET
Abs Immature Granulocytes: 0.01 10*3/uL (ref 0.00–0.07)
Basophils Absolute: 0 10*3/uL (ref 0.0–0.1)
Basophils Relative: 1 %
Eosinophils Absolute: 0.1 10*3/uL (ref 0.0–0.5)
Eosinophils Relative: 3 %
HCT: 29.9 % — ABNORMAL LOW (ref 36.0–46.0)
Hemoglobin: 10.3 g/dL — ABNORMAL LOW (ref 12.0–15.0)
Immature Granulocytes: 0 %
Lymphocytes Relative: 40 %
Lymphs Abs: 1.4 10*3/uL (ref 0.7–4.0)
MCH: 34.9 pg — ABNORMAL HIGH (ref 26.0–34.0)
MCHC: 34.4 g/dL (ref 30.0–36.0)
MCV: 101.4 fL — ABNORMAL HIGH (ref 80.0–100.0)
Monocytes Absolute: 0.4 10*3/uL (ref 0.1–1.0)
Monocytes Relative: 12 %
Neutro Abs: 1.6 10*3/uL — ABNORMAL LOW (ref 1.7–7.7)
Neutrophils Relative %: 44 %
Platelets: 270 10*3/uL (ref 150–400)
RBC: 2.95 MIL/uL — ABNORMAL LOW (ref 3.87–5.11)
RDW: 14 % (ref 11.5–15.5)
WBC: 3.5 10*3/uL — ABNORMAL LOW (ref 4.0–10.5)
nRBC: 0 % (ref 0.0–0.2)

## 2021-09-27 MED ORDER — SODIUM CHLORIDE 0.9 % IV SOLN: INTRAVENOUS | Status: DC

## 2021-09-27 MED ORDER — MIDAZOLAM HCL 2 MG/2ML IJ SOLN
INTRAMUSCULAR | Status: AC
  Filled 2021-09-27: qty 2

## 2021-09-27 MED ORDER — FENTANYL CITRATE (PF) 100 MCG/2ML IJ SOLN
INTRAMUSCULAR | Status: AC | PRN
  Administered 2021-09-27 (×2): 25 ug via INTRAVENOUS

## 2021-09-27 MED ORDER — MIDAZOLAM HCL 2 MG/2ML IJ SOLN
INTRAMUSCULAR | Status: AC | PRN
  Administered 2021-09-27 (×2): .5 mg via INTRAVENOUS

## 2021-09-27 MED ORDER — FENTANYL CITRATE (PF) 100 MCG/2ML IJ SOLN
INTRAMUSCULAR | Status: AC
  Filled 2021-09-27: qty 2

## 2021-09-27 NOTE — Procedures (Signed)
Pre-procedure Diagnosis: Concern for multiple myeloma °Post-procedure Diagnosis: Same ° °Technically successful CT guided bone marrow aspiration and biopsy of left iliac crest.  ° °Complications: None Immediate ° °EBL: None ° °Signed: °  °Pager: [phone] °09/27/2021, 10:01 AM ° ° °

## 2021-09-27 NOTE — Discharge Instructions (Signed)
Please call Interventional Radiology clinic [phone] with any questions or concerns. ° °You may remove your dressing and shower tomorrow. ° ° °Bone Marrow Aspiration and Bone Marrow Biopsy, Adult, Care After °This sheet gives you information about how to care for yourself after your procedure. Your health care provider may also give you more specific instructions. If you have problems or questions, contact your health care provider. °What can I expect after the procedure? °After the procedure, it is common to have: °Mild pain and tenderness. °Swelling. °Bruising. °Follow these instructions at home: °Puncture site care °Follow instructions from your health care provider about how to take care of the puncture site. Make sure you: °Wash your hands with soap and water before and after you change your bandage (dressing). If soap and water are not available, use hand sanitizer. °Change your dressing as told by your health care provider. °Check your puncture site every day for signs of infection. Check for: °More redness, swelling, or pain. °Fluid or blood. °Warmth. °Pus or a bad smell.   °Activity °Return to your normal activities as told by your health care provider. Ask your health care provider what activities are safe for you. °Do not lift anything that is heavier than 10 lb (4.5 kg), or the limit that you are told, until your health care provider says that it is safe. °Do not drive for 24 hours if you were given a sedative during your procedure. °General instructions °Take over-the-counter and prescription medicines only as told by your health care provider. °Do not take baths, swim, or use a hot tub until your health care provider approves. Ask your health care provider if you may take showers. You may only be allowed to take sponge baths. °If directed, put ice on the affected area. To do this: °Put ice in a plastic bag. °Place a towel between your skin and the bag. °Leave the ice on for 20 minutes, 2-3 times a  day. °Keep all follow-up visits as told by your health care provider. This is important.   °Contact a health care provider if: °Your pain is not controlled with medicine. °You have a fever. °You have more redness, swelling, or pain around the puncture site. °You have fluid or blood coming from the puncture site. °Your puncture site feels warm to the touch. °You have pus or a bad smell coming from the puncture site. °Summary °After the procedure, it is common to have mild pain, tenderness, swelling, and bruising. °Follow instructions from your health care provider about how to take care of the puncture site and what activities are safe for you. °Take over-the-counter and prescription medicines only as told by your health care provider. °Contact a health care provider if you have any signs of infection, such as fluid or blood coming from the puncture site. °This information is not intended to replace advice given to you by your health care provider. Make sure you discuss any questions you have with your health care provider. °Document Revised: 12/14/2018 Document Reviewed: 12/14/2018 °Elsevier Patient Education © 2021 [employer] ° ° °Moderate Conscious Sedation, Adult, Care After °This sheet gives you information about how to care for yourself after your procedure. Your health care provider may also give you more specific instructions. If you have problems or questions, contact your health care provider. °What can I expect after the procedure? °After the procedure, it is common to have: °Sleepiness for several hours. °Impaired judgment for several hours. °Difficulty with balance. °Vomiting if you eat too   soon. °Follow these instructions at home: °For the time period you were told by your health care provider: °Rest. °Do not participate in activities where you could fall or become injured. °Do not drive or use machinery. °Do not drink alcohol. °Do not take sleeping pills or medicines that cause drowsiness. °Do not  make important decisions or sign legal documents. °Do not take care of children on your own.  °  °  °Eating and drinking °Follow the diet recommended by your health care provider. °Drink enough fluid to keep your urine pale yellow. °If you vomit: °Drink water, juice, or soup when you can drink without vomiting. °Make sure you have little or no nausea before eating solid foods.   °General instructions °Take over-the-counter and prescription medicines only as told by your health care provider. °Have a responsible adult stay with you for the time you are told. It is important to have someone help care for you until you are awake and alert. °Do not smoke. °Keep all follow-up visits as told by your health care provider. This is important. °Contact a health care provider if: °You are still sleepy or having trouble with balance after 24 hours. °You feel light-headed. °You keep feeling nauseous or you keep vomiting. °You develop a rash. °You have a fever. °You have redness or swelling around the IV site. °Get help right away if: °You have trouble breathing. °You have new-onset confusion at home. °Summary °After the procedure, it is common to feel sleepy, have impaired judgment, or feel nauseous if you eat too soon. °Rest after you get home. Know the things you should not do after the procedure. °Follow the diet recommended by your health care provider and drink enough fluid to keep your urine pale yellow. °Get help right away if you have trouble breathing or new-onset confusion at home. °This information is not intended to replace advice given to you by your health care provider. Make sure you discuss any questions you have with your health care provider. °Document Revised: 11/25/2019 Document Reviewed: 06/23/2019 °Elsevier Patient Education © 2021 [employer] Inc.  °

## 2021-09-27 NOTE — H&P (Signed)
Chief Complaint: Patient was seen in consultation today for bone marrow biopsy with aspiration   Referring Physician(s): Dorsey,John T IV  Supervising Physician: Sandi Mariscal  Patient Status: [facility] - Out-pt  History of Present Illness: [name] Page is an 88 y.o. female with a medical history significant for sarcoidosis of the lung, collapsed lung, DVT, compression fractures and IgG Lambda monoclonal gammopathy. She was evaluated by hematology/oncology after blood work showed concern for multiple myeloma. Bone survey imaging 08/14/21 revealed several small rounded lucencies in both humeri possibly related to multiple myeloma or metastatic disease.   Interventional Radiology  has been asked to evaluate this patient for an image-guided bone marrow biopsy with aspiration  Past Medical History:  Diagnosis Date   Alopecia 10/28/2016   Anginal pain ([place])    Arthritis    Cervical spondylosis    C3-4 AND C4-5   Chest pain 07/28/2008   H/O, normal stress nuclear EF 78%   Chronic cough 07/12/2014   Collapsed lung 07/2010   being treated at [facility] - in left lung   Complication of anesthesia    Cough 07/30/2010   Qualifier: Diagnosis of  By: Chase Caller MD, Murali     Cystocele 05/23/2015   Cystocele, midline 05/02/2013   DEEP VEIN THROMBOSIS/PHLEBITIS 07/30/2010   Qualifier: History of  By: Harvest Dark CMA, Jennifer     Dislocation closed, shoulder 02/2013   DVT (deep venous thrombosis) ([place]) 2006   Encounter for therapeutic drug monitoring 10/03/2014   GERD (gastroesophageal reflux disease)    occ   Headache    hx   High cholesterol    History of recurrent UTIs 09/24/2011   HYPERLIPIDEMIA 07/30/2010   Qualifier: Diagnosis of  By: Harvest Dark CMA, Jennifer     Hypertension    HYPERTENSION 07/30/2010   Qualifier: Diagnosis of  By: Harvest Dark CMA, Jennifer     Hypothyroidism    Nasal itching 01/30/2014   Osteopenia 05/23/2015   Peripheral vascular disease ([place]) 2006   dvt's and 50 yrs  ago   Pleuritic pain 12/17/2015   PONV (postoperative nausea and vomiting)    Primary localized osteoarthritis of right hip 08/26/2016   Primary osteoarthritis of right hip 08/26/2016   Pulmonary collapse 07/30/2010   Qualifier: Diagnosis of  By: Chase Caller MD, Murali     PULMONARY NODULE 07/30/2010   Qualifier: Diagnosis of  By: Chase Caller MD, Murali     Rash and nonspecific skin eruption 01/02/2015   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   Sarcoidosis    Sarcoidosis of lung ([place])    Vaginal atrophy 09/24/2011   Visual field defect nasal step 01/02/2015    Past Surgical History:  Procedure Laterality Date   BACK SURGERY  2022   EYE SURGERY     FOOT SURGERY Left 06/2005   bunion hammer toe   HEMORRHOID SURGERY  12/1972   INTRAOCULAR LENS INSERTION     right eye 10 /16 and left 06/16/2005   JOINT REPLACEMENT     KYPHOPLASTY N/A 06/27/2021   Procedure: LUMBAR 3 AND LUMBAR  5 KYPHOPLASTY;  Surgeon: Phylliss Bob, MD;  Location: [place];  Service: Orthopedics;  Laterality: N/A;   KYPHOPLASTY N/A 09/04/2021   Procedure: THORACIC SEVEN, THORACIC EIGHT, THORACIC NINE, THORACIC TEN KYPHOPLASTY;  Surgeon: Phylliss Bob, MD;  Location: [place];  Service: Orthopedics;  Laterality: N/A;   SKIN BIOPSY  01/2007   basal cell carcinoma removed from right lower leg    TOTAL HIP ARTHROPLASTY  06/2007   left  TOTAL HIP ARTHROPLASTY Right 08/26/2016   Procedure: TOTAL HIP ARTHROPLASTY ANTERIOR APPROACH;  Surgeon: Melrose Nakayama, MD;  Location: [place];  Service: Orthopedics;  Laterality: Right;   TOTAL KNEE ARTHROPLASTY Right 10/2007   right    Allergies: Ciprofloxacin, Methotrexate derivatives, and Tylenol with codeine #3 [acetaminophen-codeine]  Medications: Prior to Admission medications   Medication Sig Start Date End Date Taking? Authorizing Provider  amLODipine (NORVASC) 10 MG tablet Take 10 mg by mouth daily.   Yes [provider]  azelastine (OPTIVAR) 0.05 % ophthalmic solution  Place 1 drop into both eyes 2 (two) times daily.    Yes [provider]  Calcium Citrate-Vitamin D (CALCIUM + D PO) Take 1 tablet by mouth daily.   Yes [provider]  Cholecalciferol (VITAMIN D-1000 MAX ST) 25 MCG (1000 UT) tablet Take 1,000 Units by mouth daily. 07/02/09  Yes [provider]  gemfibrozil (LOPID) 600 MG tablet Take 600 mg by mouth 2 (two) times daily before a meal.   Yes [provider]  levothyroxine (SYNTHROID, LEVOTHROID) 25 MCG tablet Take 25 mcg by mouth daily.    Yes [provider]  LORazepam (ATIVAN) 0.5 MG tablet Take 0.5 mg by mouth daily as needed for anxiety or sleep. 05/14/21  Yes [provider]  losartan (COZAAR) 50 MG tablet Take 50 mg by mouth daily.   Yes [provider]  methocarbamol (ROBAXIN) 500 MG tablet Take 1 tablet (500 mg total) by mouth every 6 (six) hours as needed for muscle spasms. 09/04/21  Yes McKenzie, Lennie Muckle, PA-C  Multiple Vitamin (MULTIVITAMIN) capsule Take 1 capsule by mouth daily.   Yes [provider]  pantoprazole (PROTONIX) 20 MG tablet Take 20 mg by mouth daily.   Yes [provider]  PERCOCET 10-325 MG tablet Take 1 tablet by mouth every 6 (six) hours as needed for pain. 08/21/21  Yes [provider]  polyethylene glycol powder (GLYCOLAX/MIRALAX) 17 GM/SCOOP powder Take 17 g by mouth at bedtime. 07/26/19  Yes [provider]  predniSONE (DELTASONE) 1 MG tablet Take 2 tablets (2 mg total) by mouth daily with breakfast. 09/19/21  Yes Brand Males, MD     Family History  Problem Relation Age of Onset   Diabetes Mother    Heart failure Mother    Hypertension Father    Diabetes Father    Heart disease Father    Heart attack Father     Social History   Socioeconomic History   Marital status: Married    Spouse name: Not on file   Number of children: Not on file   Years of education: Not on file   Highest education level: Not on  file  Occupational History   Occupation: retired    Fish farm manager: RETIRED  Tobacco Use   Smoking status: Never   Smokeless tobacco: Never  Vaping Use   Vaping Use: Never used  Substance and Sexual Activity   Alcohol use: Not Currently    Comment: BEER/glass of wine A DAY   Drug use: No   Sexual activity: Never  Other Topics Concern   Not on file  Social History Narrative   Not on file   Social Determinants of Health   Financial Resource Strain: Not on file  Food Insecurity: Not on file  Transportation Needs: Not on file  Physical Activity: Not on file  Stress: Not on file  Social Connections: Not on file    Review of Systems: A 12 point ROS  discussed and pertinent positives are indicated in the HPI above.  All other systems are negative.  Review of Systems  Constitutional:  Negative for appetite change and fatigue.  Respiratory:  Negative for cough and shortness of breath.   Cardiovascular:  Negative for chest pain and leg swelling.  Gastrointestinal:  Negative for abdominal pain, diarrhea, nausea and vomiting.  Musculoskeletal:  Positive for back pain.       History of compression fractures  Neurological:  Negative for dizziness and headaches.   Vital Signs: BP 136/60    Pulse 67    Temp 97.7 F (36.5 C) (Oral)    Resp 18    Ht '5\' 1"'  (1.549 m)    Wt 107 lb 12.7 oz (48.9 kg)    SpO2 99%    BMI 20.37 kg/m   Physical Exam Constitutional:      General: She is not in acute distress.    Appearance: She is not ill-appearing.  HENT:     Mouth/Throat:     Mouth: Mucous membranes are moist.     Pharynx: Oropharynx is clear.  Cardiovascular:     Rate and Rhythm: Normal rate and regular rhythm.     Pulses: Normal pulses.     Heart sounds: Normal heart sounds.  Pulmonary:     Effort: Pulmonary effort is normal.     Breath sounds: Normal breath sounds.  Abdominal:     General: Bowel sounds are normal.     Palpations: Abdomen is soft.  Musculoskeletal:     Right lower  leg: No edema.     Left lower leg: No edema.  Skin:    General: Skin is warm and dry.  Neurological:     Mental Status: She is alert and oriented to person, place, and time.    Imaging: DG Thoracic Spine 2 View  Result Date: 09/04/2021 CLINICAL DATA:  Midthoracic compression fractures status post vertebral augmentation. EXAM: THORACIC SPINE 2 VIEWS COMPARISON:  MRI 08/15/2021 FINDINGS: Fluoroscopic spot images demonstrate vertebral augmentation changes at T7, T8, T9 and T10. Some bone cement is noted in a left paravertebral vein at T7. IMPRESSION: Vertebral augmentation changes at T7, T8, T9 and T10. Electronically Signed   By: Marijo Sanes M.D.   On: 09/04/2021 16:11   DG C-Arm 1-60 Min-No Report  Result Date: 09/04/2021 Fluoroscopy was utilized by the requesting physician.  No radiographic interpretation.   DG C-Arm 1-60 Min-No Report  Result Date: 09/04/2021 Fluoroscopy was utilized by the requesting physician.  No radiographic interpretation.    Labs:  CBC: Recent Labs    06/04/21 1100 08/07/21 1621 08/29/21 0916 09/27/21 0746  WBC 5.5 3.9* 3.5* 3.5*  HGB 11.1* 9.8* 9.4* 10.3*  HCT 32.4* 29.3* 28.5* 29.9*  PLT 306 255 296 270    COAGS: Recent Labs    06/04/21 1100  INR 1.1  APTT 29    BMP: Recent Labs    06/04/21 1100 08/07/21 1621  NA 132* 134*  K 3.4* 4.2  CL 99 103  CO2 24 21*  GLUCOSE 106* 106*  BUN 13 17  CALCIUM 9.3 9.1  CREATININE 0.65 0.68  GFRNONAA >60 >60    LIVER FUNCTION TESTS: Recent Labs    06/04/21 1100 08/07/21 1621  BILITOT 0.7 0.6  AST 19 21  ALT 13 14  ALKPHOS 98 104  PROT 8.3* 9.3*  ALBUMIN 3.4* 3.7    TUMOR MARKERS: No results for input(s): AFPTM, CEA, CA199, CHROMGRNA in the last 8760 hours.  Assessment and Plan:  IgG Lambda Monoclonal Gammopathy; concern for multiple myeloma: [name] Page, 88 year old female, presents today to the [facility] department for an image-guided bone  marrow biopsy with aspiration.   Risks and benefits of this procedure were discussed with the patient and/or patient's family including, but not limited to bleeding, infection, damage to adjacent structures or low yield requiring additional tests.  All of the questions were answered and there is agreement to proceed.  Consent signed and in chart. She has been NPO.   Thank you for this interesting consult.  I greatly enjoyed meeting [name] Page and look forward to participating in their care.  A copy of this report was sent to the requesting provider on this date.  Electronically Signed: Soyla Dryer, AGACNP-BC [phone] 09/27/2021, 8:05 AM   I spent a total of  30 Minutes   in face to face in clinical consultation, greater than 50% of which was counseling/coordinating care for bone marrow biopsy with aspiration

## 2021-10-01 LAB — SURGICAL PATHOLOGY

## 2021-10-04 ENCOUNTER — Inpatient Hospital Stay: Payer: Medicare PPO

## 2021-10-04 ENCOUNTER — Other Ambulatory Visit: Payer: Self-pay

## 2021-10-04 ENCOUNTER — Inpatient Hospital Stay: Payer: Medicare PPO | Attending: Hematology and Oncology | Admitting: Hematology and Oncology

## 2021-10-04 ENCOUNTER — Ambulatory Visit (HOSPITAL_COMMUNITY): Payer: Medicare PPO

## 2021-10-04 VITALS — BP 155/73 | HR 85 | Temp 97.1°F | Resp 17 | Ht 61.0 in | Wt 116.9 lb

## 2021-10-04 DIAGNOSIS — D472 Monoclonal gammopathy: Secondary | ICD-10-CM

## 2021-10-04 DIAGNOSIS — C9000 Multiple myeloma not having achieved remission: Secondary | ICD-10-CM | POA: Diagnosis not present

## 2021-10-04 DIAGNOSIS — Z79899 Other long term (current) drug therapy: Secondary | ICD-10-CM | POA: Diagnosis not present

## 2021-10-07 ENCOUNTER — Encounter (HOSPITAL_COMMUNITY): Payer: Self-pay | Admitting: Hematology and Oncology

## 2021-10-08 ENCOUNTER — Encounter: Payer: Self-pay | Admitting: Hematology and Oncology

## 2021-10-08 ENCOUNTER — Encounter (HOSPITAL_COMMUNITY): Payer: Self-pay | Admitting: Hematology and Oncology

## 2021-10-08 DIAGNOSIS — C9000 Multiple myeloma not having achieved remission: Secondary | ICD-10-CM | POA: Insufficient documentation

## 2021-10-08 MED ORDER — ONDANSETRON HCL 8 MG PO TABS: 8.0000 mg | ORAL_TABLET | Freq: Three times a day (TID) | ORAL | 0 refills | Status: DC | PRN

## 2021-10-08 MED ORDER — ACYCLOVIR 400 MG PO TABS: 400.0000 mg | ORAL_TABLET | Freq: Two times a day (BID) | ORAL | 2 refills | Status: DC

## 2021-10-08 MED ORDER — PROCHLORPERAZINE MALEATE 10 MG PO TABS: 10.0000 mg | ORAL_TABLET | Freq: Four times a day (QID) | ORAL | 0 refills | Status: DC | PRN

## 2021-10-08 NOTE — Progress Notes (Signed)
START ON PATHWAY REGIMEN - Multiple Myeloma and Other Plasma Cell Dyscrasias     A cycle is every 21 days:     Bortezomib      Lenalidomide      Dexamethasone   **Always confirm dose/schedule in your pharmacy ordering system**  Patient Characteristics: Multiple Myeloma, Newly Diagnosed, Transplant Ineligible or Refused, High Risk Disease Classification: Multiple Myeloma R-ISS Staging: II Therapeutic Status: Newly Diagnosed Is Patient Eligible for Transplant<= Transplant Ineligible or Refused Risk Status: High Risk Intent of Therapy: Curative Intent, Discussed with Patient

## 2021-10-08 NOTE — Progress Notes (Signed)
[place] [phone]   [phone]  PROGRESS NOTE  Patient Care Team: Burnard Bunting, MD as PCP - General (Internal Medicine)  Hematological/Oncological History # IgG Lambda Multiple Myeloma 06/04/2021: WBC 5.5, Hgb 11.1, MCV 101.3, Plt 306 07/24/2021: M protein 2.3, IFE shows IgG Lambda monoclonal specificity. Cr 0.9 08/07/2021: Establish care with Dr. Lorenso Courier 09/27/2021: Bmbx showed hypercellular bone marrow with plasma cell neoplasm, increased number of  atypical plasma cells averaging 20% of all cells in the aspirate  Interval History:  [name] Page 88 y.o. female with medical history significant for newly diagnosed IgG lambda multiple myeloma who presents for a follow up visit. The patient's last visit was on 10/08/2020. In the interim since the last visit underwent a bone marrow biopsy which confirmed the diagnosis of multiple myeloma.  On exam today [name] Page is accompanied by her husband.  She reports that she developed some black and blue marks at the site of the bone marrow biopsy but not have any bleeding.  She notes that she does continue to have some stiffness of her back but has been taking naproxen with some relief.  She has continued to lose weight and is down approximately 20 pounds.  She notes that she has not been having any issues with fevers, chills, sweats, nausea, vomiting or diarrhea.  Overall she is a similar level of health that she was during her prior visit in December.  A full 10 point ROS is listed below.  The bulk of our discussion focused on the diagnosis of multiple myeloma and treatment moving forward.  The details of this conversation are noted below.  MEDICAL HISTORY:  Past Medical History:  Diagnosis Date   Alopecia 10/28/2016   Anginal pain (HCC)    Arthritis    Cervical spondylosis    C3-4 AND C4-5   Chest pain 07/28/2008   H/O, normal stress nuclear EF 78%   Chronic cough 07/12/2014   Collapsed lung  07/2010   being treated at [facility] - in left lung   Complication of anesthesia    Cough 07/30/2010   Qualifier: Diagnosis of  By: Chase Caller MD, Murali     Cystocele 05/23/2015   Cystocele, midline 05/02/2013   DEEP VEIN THROMBOSIS/PHLEBITIS 07/30/2010   Qualifier: History of  By: Harvest Dark CMA, Jennifer     Dislocation closed, shoulder 02/2013   DVT (deep venous thrombosis) ([place]) 2006   Encounter for therapeutic drug monitoring 10/03/2014   GERD (gastroesophageal reflux disease)    occ   Headache    hx   High cholesterol    History of recurrent UTIs 09/24/2011   HYPERLIPIDEMIA 07/30/2010   Qualifier: Diagnosis of  By: Harvest Dark CMA, Jennifer     Hypertension    HYPERTENSION 07/30/2010   Qualifier: Diagnosis of  By: Harvest Dark CMA, Jennifer     Hypothyroidism    Nasal itching 01/30/2014   Osteopenia 05/23/2015   Peripheral vascular disease ([place]) 2006   dvt's and 50 yrs ago   Pleuritic pain 12/17/2015   PONV (postoperative nausea and vomiting)    Primary localized osteoarthritis of right hip 08/26/2016   Primary osteoarthritis of right hip 08/26/2016   Pulmonary collapse 07/30/2010   Qualifier: Diagnosis of  By: Chase Caller MD, Murali     PULMONARY NODULE 07/30/2010   Qualifier: Diagnosis of  By: Chase Caller MD, Murali     Rash and nonspecific skin eruption 01/02/2015   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   Sarcoidosis    Sarcoidosis of  lung ([place])    Vaginal atrophy 09/24/2011   Visual field defect nasal step 01/02/2015    SURGICAL HISTORY: Past Surgical History:  Procedure Laterality Date   BACK SURGERY  2022   EYE SURGERY     FOOT SURGERY Left 06/2005   bunion hammer toe   HEMORRHOID SURGERY  12/1972   INTRAOCULAR LENS INSERTION     right eye 10 /16 and left 06/16/2005   JOINT REPLACEMENT     KYPHOPLASTY N/A 06/27/2021   Procedure: LUMBAR 3 AND LUMBAR  5 KYPHOPLASTY;  Surgeon: Phylliss Bob, MD;  Location: [place];  Service: Orthopedics;  Laterality: N/A;   KYPHOPLASTY  N/A 09/04/2021   Procedure: THORACIC SEVEN, THORACIC EIGHT, THORACIC NINE, THORACIC TEN KYPHOPLASTY;  Surgeon: Phylliss Bob, MD;  Location: [place];  Service: Orthopedics;  Laterality: N/A;   SKIN BIOPSY  01/2007   basal cell carcinoma removed from right lower leg    TOTAL HIP ARTHROPLASTY  06/2007   left   TOTAL HIP ARTHROPLASTY Right 08/26/2016   Procedure: TOTAL HIP ARTHROPLASTY ANTERIOR APPROACH;  Surgeon: Melrose Nakayama, MD;  Location: [place];  Service: Orthopedics;  Laterality: Right;   TOTAL KNEE ARTHROPLASTY Right 10/2007   right    SOCIAL HISTORY: Social History   Socioeconomic History   Marital status: Married    Spouse name: Not on file   Number of children: Not on file   Years of education: Not on file   Highest education level: Not on file  Occupational History   Occupation: retired    Fish farm manager: RETIRED  Tobacco Use   Smoking status: Never   Smokeless tobacco: Never  Vaping Use   Vaping Use: Never used  Substance and Sexual Activity   Alcohol use: Not Currently    Comment: BEER/glass of wine A DAY   Drug use: No   Sexual activity: Never  Other Topics Concern   Not on file  Social History Narrative   Not on file   Social Determinants of Health   Financial Resource Strain: Not on file  Food Insecurity: Not on file  Transportation Needs: Not on file  Physical Activity: Not on file  Stress: Not on file  Social Connections: Not on file  Intimate Partner Violence: Not on file    FAMILY HISTORY: Family History  Problem Relation Age of Onset   Diabetes Mother    Heart failure Mother    Hypertension Father    Diabetes Father    Heart disease Father    Heart attack Father     ALLERGIES:  is allergic to methotrexate derivatives.  MEDICATIONS:  Current Outpatient Medications  Medication Sig Dispense Refill   acyclovir (ZOVIRAX) 400 MG tablet Take 1 tablet (400 mg total) by mouth 2 (two) times daily. 60 tablet 2   ondansetron (ZOFRAN) 8 MG tablet Take  1 tablet (8 mg total) by mouth every 8 (eight) hours as needed. 30 tablet 0   prochlorperazine (COMPAZINE) 10 MG tablet Take 1 tablet (10 mg total) by mouth every 6 (six) hours as needed for nausea or vomiting. 30 tablet 0   amLODipine (NORVASC) 10 MG tablet Take 10 mg by mouth daily.     azelastine (OPTIVAR) 0.05 % ophthalmic solution Place 1 drop into both eyes 2 (two) times daily.      Calcium Citrate-Vitamin D (CALCIUM + D PO) Take 1 tablet by mouth daily.     Cholecalciferol (VITAMIN D-1000 MAX ST) 25 MCG (1000 UT) tablet Take 1,000 Units by  mouth daily.     gemfibrozil (LOPID) 600 MG tablet Take 600 mg by mouth 2 (two) times daily before a meal.     levothyroxine (SYNTHROID, LEVOTHROID) 25 MCG tablet Take 25 mcg by mouth daily.      LORazepam (ATIVAN) 0.5 MG tablet Take 0.5 mg by mouth daily as needed for anxiety or sleep.     losartan (COZAAR) 50 MG tablet Take 50 mg by mouth daily.     Multiple Vitamin (MULTIVITAMIN) capsule Take 1 capsule by mouth daily.     pantoprazole (PROTONIX) 20 MG tablet Take 20 mg by mouth daily.     polyethylene glycol powder (GLYCOLAX/MIRALAX) 17 GM/SCOOP powder Take 17 g by mouth at bedtime.     predniSONE (DELTASONE) 1 MG tablet Take 2 tablets (2 mg total) by mouth daily with breakfast. 90 tablet 3   No current facility-administered medications for this visit.    REVIEW OF SYSTEMS:   Constitutional: ( - ) fevers, ( - )  chills , ( - ) night sweats Eyes: ( - ) blurriness of vision, ( - ) double vision, ( - ) watery eyes Ears, nose, mouth, throat, and face: ( - ) mucositis, ( - ) sore throat Respiratory: ( - ) cough, ( - ) dyspnea, ( - ) wheezes Cardiovascular: ( - ) palpitation, ( - ) chest discomfort, ( - ) lower extremity swelling Gastrointestinal:  ( - ) nausea, ( - ) heartburn, ( - ) change in bowel habits Skin: ( - ) abnormal skin rashes Lymphatics: ( - ) new lymphadenopathy, ( - ) easy bruising Neurological: ( - ) numbness, ( - ) tingling, ( - )  new weaknesses Behavioral/Psych: ( - ) mood change, ( - ) new changes  All other systems were reviewed with the patient and are negative.  PHYSICAL EXAMINATION: ECOG PERFORMANCE STATUS: 2 - Symptomatic, <50% confined to bed  Vitals:   10/04/21 1343  BP: (!) 155/73  Pulse: 85  Resp: 17  Temp: (!) 97.1 F (36.2 C)  SpO2: 99%   Filed Weights   10/04/21 1343  Weight: 116 lb 14.4 oz (53 kg)    GENERAL: Well-appearing elderly Caucasian female, alert, no distress and comfortable SKIN: skin color, texture, turgor are normal, no rashes or significant lesions EYES: conjunctiva are pink and non-injected, sclera clear LUNGS: clear to auscultation and percussion with normal breathing effort HEART: regular rate & rhythm and no murmurs and no lower extremity edema Musculoskeletal: no cyanosis of digits and no clubbing  PSYCH: alert & oriented x 3, fluent speech NEURO: no focal motor/sensory deficits  LABORATORY DATA:  I have reviewed the data as listed CBC Latest Ref Rng & Units 09/27/2021 08/29/2021 08/07/2021  WBC 4.0 - 10.5 K/uL 3.5(L) 3.5(L) 3.9(L)  Hemoglobin 12.0 - 15.0 g/dL 10.3(L) 9.4(L) 9.8(L)  Hematocrit 36.0 - 46.0 % 29.9(L) 28.5(L) 29.3(L)  Platelets 150 - 400 K/uL 270 296 255    CMP Latest Ref Rng & Units 08/07/2021 06/04/2021 05/02/2019  Glucose 70 - 99 mg/dL 106(H) 106(H) 115(H)  BUN 8 - 23 mg/dL '17 13 16  ' Creatinine 0.44 - 1.00 mg/dL 0.68 0.65 0.73  Sodium 135 - 145 mmol/L 134(L) 132(L) 142  Potassium 3.5 - 5.1 mmol/L 4.2 3.4(L) 5.7(H)  Chloride 98 - 111 mmol/L 103 99 105  CO2 22 - 32 mmol/L 21(L) 24 22  Calcium 8.9 - 10.3 mg/dL 9.1 9.3 10.0  Total Protein 6.5 - 8.1 g/dL 9.3(H) 8.3(H) 7.9  Total Bilirubin 0.3 -  1.2 mg/dL 0.6 0.7 0.4  Alkaline Phos 38 - 126 U/L 104 98 83  AST 15 - 41 U/L '21 19 20  ' ALT 0 - 44 U/L '14 13 10    ' Lab Results  Component Value Date   MPROTEIN 2.8 (H) 08/07/2021   Lab Results  Component Value Date   KPAFRELGTCHN 11.7 08/07/2021    LAMBDASER 20.5 08/07/2021   KAPLAMBRATIO >4.18 08/14/2021   KAPLAMBRATIO 0.57 08/07/2021   RADIOGRAPHIC STUDIES: CT BIOPSY  Result Date: 10-21-21 INDICATION: Multiple myeloma. Please perform CT-guided bone marrow biopsy for tissue diagnostic purposes. EXAM: CT-GUIDED BONE MARROW BIOPSY AND ASPIRATION MEDICATIONS: None ANESTHESIA/SEDATION: Moderate (conscious) sedation was employed during this procedure as administered by the Interventional Radiology RN. A total of Versed 1 mg and Fentanyl 50 mcg was administered intravenously. Moderate Sedation Time: 10 minutes. The patient's level of consciousness and vital signs were monitored continuously by radiology nursing throughout the procedure under my direct supervision. COMPLICATIONS: None immediate. PROCEDURE: Informed consent was obtained from the patient following an explanation of the procedure, risks, benefits and alternatives. The patient understands, agrees and consents for the procedure. All questions were addressed. A time out was performed prior to the initiation of the procedure. The patient was positioned prone and non-contrast localization CT was performed of the pelvis to demonstrate the iliac marrow spaces. The operative site was prepped and draped in the usual sterile fashion. Under sterile conditions and local anesthesia, a 22 gauge spinal needle was utilized for procedural planning. Next, an 11 gauge coaxial bone biopsy needle was advanced into the left iliac marrow space. Needle position was confirmed with CT imaging. Initially, a bone marrow aspiration was performed. Next, a bone marrow biopsy was obtained with the 11 gauge outer bone marrow device. Samples were prepared with the cytotechnologist and deemed adequate. The needle was removed and superficial hemostasis was obtained with manual compression. A dressing was applied. The patient tolerated the procedure well without immediate post procedural complication. IMPRESSION: Successful CT  guided left iliac bone marrow aspiration and core biopsy. Electronically Signed   By: Sandi Mariscal M.D.   On: October 21, 2021 14:40   CT BONE MARROW BIOPSY & ASPIRATION  Result Date: 10-21-2021 INDICATION: Multiple myeloma. Please perform CT-guided bone marrow biopsy for tissue diagnostic purposes. EXAM: CT-GUIDED BONE MARROW BIOPSY AND ASPIRATION MEDICATIONS: None ANESTHESIA/SEDATION: Moderate (conscious) sedation was employed during this procedure as administered by the Interventional Radiology RN. A total of Versed 1 mg and Fentanyl 50 mcg was administered intravenously. Moderate Sedation Time: 10 minutes. The patient's level of consciousness and vital signs were monitored continuously by radiology nursing throughout the procedure under my direct supervision. COMPLICATIONS: None immediate. PROCEDURE: Informed consent was obtained from the patient following an explanation of the procedure, risks, benefits and alternatives. The patient understands, agrees and consents for the procedure. All questions were addressed. A time out was performed prior to the initiation of the procedure. The patient was positioned prone and non-contrast localization CT was performed of the pelvis to demonstrate the iliac marrow spaces. The operative site was prepped and draped in the usual sterile fashion. Under sterile conditions and local anesthesia, a 22 gauge spinal needle was utilized for procedural planning. Next, an 11 gauge coaxial bone biopsy needle was advanced into the left iliac marrow space. Needle position was confirmed with CT imaging. Initially, a bone marrow aspiration was performed. Next, a bone marrow biopsy was obtained with the 11 gauge outer bone marrow device. Samples were prepared with the cytotechnologist  and deemed adequate. The needle was removed and superficial hemostasis was obtained with manual compression. A dressing was applied. The patient tolerated the procedure well without immediate post procedural  complication. IMPRESSION: Successful CT guided left iliac bone marrow aspiration and core biopsy. Electronically Signed   By: Sandi Mariscal M.D.   On: 09/27/2021 14:40    ASSESSMENT & PLAN [name] Page 88 y.o. female with medical history significant for newly diagnosed IgG lambda multiple myeloma who presents for a follow up visit.  After review the labs, review the findings, discussion with the patient the findings are most consistent with an IgG lambda multiple myeloma.  The patient has not bone marrow involvement of 20% with a plasma cell neoplasm as well as concern for lytic lesions, anemia, macrocytosis, and leukopenia.  Given her advanced age would recommend proceeding with Velcade and dexamethasone alone initially.  If she was able to tolerate this well we could consider the addition of Revlimid therapy as well.  The patient voiced understanding of the diagnosis and the treatment plan moving forward.  She was agreeable to Velcade with dexamethasone as initial treatment.  #IgG Lambda Multiple Myeloma -- Diagnosis confirmed by bone marrow biopsy on 09/27/2021 which shows evidence of 20% involvement by plasma cells.  Additionally has anemia, macrocytosis, leukopenia, and concern for lytic lesions on bone imaging --Prognostic panel shows a deletion 17 with a TP53 mutation --Initial labs show M protein of 2.8 with a normal kappa lambda ratio Plan: --assure monthly SPEP, SFLC  and weekly CBC, CMP, and LDH --plan for Cycle 1 Day 1 of Velcade/Dex to start on 10/18/2021 -- Return to clinic for cycle 1 day 8 of treatment to assure tolerance.  #Supportive Care -- chemotherapy education to be scheduled  -- port placement not required.  -- zofran 43m q8H PRN and compazine 137mPO q6H for nausea -- acyclovir 40020mO BID for VCZ prophylaxis -- We will start Zometa as soon as is feasible after dental clearance --Referral to dietary   No orders of the defined types were placed in this  encounter.   All questions were answered. The patient knows to call the clinic with any problems, questions or concerns.  A total of more than 30 minutes were spent on this encounter with face-to-face time and non-face-to-face time, including preparing to see the patient, ordering tests and/or medications, counseling the patient and coordination of care as outlined above.   JohLedell PeoplesD Department of Hematology/Oncology [facility]: [phone]: [phone]: [email or web address]  10/08/2021 2:28 PM

## 2021-10-09 ENCOUNTER — Other Ambulatory Visit: Payer: Self-pay | Admitting: *Deleted

## 2021-10-09 DIAGNOSIS — C9000 Multiple myeloma not having achieved remission: Secondary | ICD-10-CM

## 2021-10-11 NOTE — Progress Notes (Signed)
Pharmacist Chemotherapy Monitoring - Initial Assessment   ? ?Anticipated start date: 10/18/2021   ? ?The following has been reviewed per standard work regarding the patient's treatment regimen: ?The patient's diagnosis, treatment plan and drug doses, and organ/hematologic function ?Lab orders and baseline tests specific to treatment regimen  ?The treatment plan start date, drug sequencing, and pre-medications ?Prior authorization status  ?Patient's documented medication list, including drug-drug interaction screen and prescriptions for anti-emetics and supportive care specific to the treatment regimen ?The drug concentrations, fluid compatibility, administration routes, and timing of the medications to be used ?The patient's access for treatment and lifetime cumulative dose history, if applicable  ?The patient's medication allergies and previous infusion related reactions, if applicable  ? ?Changes made to treatment plan:  ?N/A ? ?Follow up needed:  ?Pending authorization for treatment  ? ? ?Larene Beach, RPH, ?10/11/2021  11:32 AM ? ?

## 2021-10-14 ENCOUNTER — Other Ambulatory Visit: Payer: Self-pay

## 2021-10-14 ENCOUNTER — Inpatient Hospital Stay: Payer: Medicare PPO | Attending: Hematology and Oncology

## 2021-10-14 DIAGNOSIS — Z800 Family history of malignant neoplasm of digestive organs: Secondary | ICD-10-CM | POA: Insufficient documentation

## 2021-10-14 DIAGNOSIS — Z5112 Encounter for antineoplastic immunotherapy: Secondary | ICD-10-CM | POA: Insufficient documentation

## 2021-10-14 DIAGNOSIS — C9000 Multiple myeloma not having achieved remission: Secondary | ICD-10-CM | POA: Insufficient documentation

## 2021-10-17 NOTE — Progress Notes (Signed)
Pharmacist Chemotherapy Monitoring - Initial Assessment   ? ?Anticipated start date: 10/24/21  ? ?The following has been reviewed per standard work regarding the patient's treatment regimen: ?The patient's diagnosis, treatment plan and drug doses, and organ/hematologic function ?Lab orders and baseline tests specific to treatment regimen  ?The treatment plan start date, drug sequencing, and pre-medications ?Prior authorization status  ?Patient's documented medication list, including drug-drug interaction screen and prescriptions for anti-emetics and supportive care specific to the treatment regimen ?The drug concentrations, fluid compatibility, administration routes, and timing of the medications to be used ?The patient's access for treatment and lifetime cumulative dose history, if applicable  ?The patient's medication allergies and previous infusion related reactions, if applicable  ? ?Changes made to treatment plan:  ?N/A ? ?Follow up needed:  ?N/A ? ? ?[name] Page, [place], ?10/17/2021  3:03 PM  ?

## 2021-10-18 ENCOUNTER — Other Ambulatory Visit: Payer: Self-pay | Admitting: Physician Assistant

## 2021-10-18 ENCOUNTER — Inpatient Hospital Stay: Payer: Medicare PPO

## 2021-10-18 ENCOUNTER — Inpatient Hospital Stay (HOSPITAL_BASED_OUTPATIENT_CLINIC_OR_DEPARTMENT_OTHER): Payer: Medicare PPO

## 2021-10-18 ENCOUNTER — Other Ambulatory Visit: Payer: Self-pay

## 2021-10-18 VITALS — BP 141/66 | HR 73 | Temp 97.8°F | Resp 18 | Wt 115.0 lb

## 2021-10-18 DIAGNOSIS — C9000 Multiple myeloma not having achieved remission: Secondary | ICD-10-CM

## 2021-10-18 DIAGNOSIS — Z800 Family history of malignant neoplasm of digestive organs: Secondary | ICD-10-CM | POA: Diagnosis not present

## 2021-10-18 DIAGNOSIS — Z5112 Encounter for antineoplastic immunotherapy: Secondary | ICD-10-CM | POA: Diagnosis not present

## 2021-10-18 LAB — CBC WITH DIFFERENTIAL (CANCER CENTER ONLY)
Abs Immature Granulocytes: 0.01 10*3/uL (ref 0.00–0.07)
Basophils Absolute: 0 10*3/uL (ref 0.0–0.1)
Basophils Relative: 0 %
Eosinophils Absolute: 0 10*3/uL (ref 0.0–0.5)
Eosinophils Relative: 1 %
HCT: 27.1 % — ABNORMAL LOW (ref 36.0–46.0)
Hemoglobin: 9.2 g/dL — ABNORMAL LOW (ref 12.0–15.0)
Immature Granulocytes: 0 %
Lymphocytes Relative: 21 %
Lymphs Abs: 0.7 10*3/uL (ref 0.7–4.0)
MCH: 34.6 pg — ABNORMAL HIGH (ref 26.0–34.0)
MCHC: 33.9 g/dL (ref 30.0–36.0)
MCV: 101.9 fL — ABNORMAL HIGH (ref 80.0–100.0)
Monocytes Absolute: 0.3 10*3/uL (ref 0.1–1.0)
Monocytes Relative: 9 %
Neutro Abs: 2.4 10*3/uL (ref 1.7–7.7)
Neutrophils Relative %: 69 %
Platelet Count: 271 10*3/uL (ref 150–400)
RBC: 2.66 MIL/uL — ABNORMAL LOW (ref 3.87–5.11)
RDW: 14 % (ref 11.5–15.5)
WBC Count: 3.5 10*3/uL — ABNORMAL LOW (ref 4.0–10.5)
nRBC: 0 % (ref 0.0–0.2)

## 2021-10-18 LAB — CMP (CANCER CENTER ONLY)
ALT: 9 U/L (ref 0–44)
AST: 17 U/L (ref 15–41)
Albumin: 3.7 g/dL (ref 3.5–5.0)
Alkaline Phosphatase: 154 U/L — ABNORMAL HIGH (ref 38–126)
Anion gap: 10 (ref 5–15)
BUN: 24 mg/dL — ABNORMAL HIGH (ref 8–23)
CO2: 20 mmol/L — ABNORMAL LOW (ref 22–32)
Calcium: 9.5 mg/dL (ref 8.9–10.3)
Chloride: 101 mmol/L (ref 98–111)
Creatinine: 1.2 mg/dL — ABNORMAL HIGH (ref 0.44–1.00)
GFR, Estimated: 44 mL/min — ABNORMAL LOW (ref >60)
Glucose, Bld: 122 mg/dL — ABNORMAL HIGH (ref 70–99)
Potassium: 4.2 mmol/L (ref 3.5–5.1)
Sodium: 131 mmol/L — ABNORMAL LOW (ref 135–145)
Total Bilirubin: 0.3 mg/dL (ref 0.3–1.2)
Total Protein: 9.2 g/dL — ABNORMAL HIGH (ref 6.5–8.1)

## 2021-10-18 LAB — LACTATE DEHYDROGENASE: LDH: 119 U/L (ref 98–192)

## 2021-10-18 MED ORDER — DEXAMETHASONE 4 MG PO TABS
40.0000 mg | ORAL_TABLET | Freq: Once | ORAL | Status: AC
  Administered 2021-10-18: 40 mg via ORAL

## 2021-10-18 MED ORDER — BORTEZOMIB CHEMO SQ INJECTION 3.5 MG (2.5MG/ML)
1.3000 mg/m2 | Freq: Once | INTRAMUSCULAR | Status: AC
  Administered 2021-10-18: 2 mg via SUBCUTANEOUS
  Filled 2021-10-18: qty 0.8

## 2021-10-18 NOTE — Patient Instructions (Signed)
[name]  Discharge Instructions: Thank you for choosing [facility] to provide your oncology and hematology care.   If you have a lab appointment with the [facility], please go directly to the [facility] and check in at the registration area.   Wear comfortable clothing and clothing appropriate for easy access to any Portacath or PICC line.   We strive to give you quality time with your provider. You may need to reschedule your appointment if you arrive late (15 or more minutes).  Arriving late affects you and other patients whose appointments are after yours.  Also, if you miss three or more appointments without notifying the office, you may be dismissed from the clinic at the providers discretion.      For prescription refill requests, have your pharmacy contact our office and allow 72 hours for refills to be completed.    Today you received the following chemotherapy and/or immunotherapy agents: Velcade      To help prevent nausea and vomiting after your treatment, we encourage you to take your nausea medication as directed.  BELOW ARE SYMPTOMS THAT SHOULD BE REPORTED IMMEDIATELY: *FEVER GREATER THAN 100.4 F (38 C) OR HIGHER *CHILLS OR SWEATING *NAUSEA AND VOMITING THAT IS NOT CONTROLLED WITH YOUR NAUSEA MEDICATION *UNUSUAL SHORTNESS OF BREATH *UNUSUAL BRUISING OR BLEEDING *URINARY PROBLEMS (pain or burning when urinating, or frequent urination) *BOWEL PROBLEMS (unusual diarrhea, constipation, pain near the anus) TENDERNESS IN MOUTH AND THROAT WITH OR WITHOUT PRESENCE OF ULCERS (sore throat, sores in mouth, or a toothache) UNUSUAL RASH, SWELLING OR PAIN  UNUSUAL VAGINAL DISCHARGE OR ITCHING   Items with * indicate a potential emergency and should be followed up as soon as possible or go to the Emergency Department if any problems should occur.  Please show the CHEMOTHERAPY ALERT CARD or IMMUNOTHERAPY ALERT CARD at check-in to  the Emergency Department and triage nurse.  Should you have questions after your visit or need to cancel or reschedule your appointment, please contact [facility]  Dept: [phone]  and follow the prompts.  Office hours are 8:00 a.m. to 4:30 p.m. Monday - Friday. Please note that voicemails left after 4:00 p.m. may not be returned until the following business day.  We are closed weekends and major holidays. You have access to a nurse at all times for urgent questions. Please call the main number to the clinic Dept: [phone] and follow the prompts.   For any non-urgent questions, you may also contact your provider using MyChart. We now offer e-Visits for anyone 23 and older to request care online for non-urgent symptoms. For details visit [email or web address].   Also download the MyChart app! Go to the app store, search "MyChart", open the app, select [facility], and log in with your MyChart username and password.  Due to Covid, a mask is required upon entering the hospital/clinic. If you do not have a mask, one will be given to you upon arrival. For doctor visits, patients may have 1 support person aged 72 or older with them. For treatment visits, patients cannot have anyone with them due to current Covid guidelines and our immunocompromised population.   Bortezomib injection What is this medication? BORTEZOMIB (bor TEZ oh mib) targets proteins in cancer cells and stops the cancer cells from growing. It treats multiple myeloma and mantle cell lymphoma. This medicine may be used for other purposes; ask your health care provider or pharmacist if you have  NAME(S): Velcade °What should I tell my care team before I take this medication? °They need to know if you have any of these conditions: °dehydration °diabetes (high blood sugar) °heart disease °liver disease °tingling of the fingers or toes or other nerve disorder °an unusual or allergic  reaction to bortezomib, mannitol, boron, other medicines, foods, dyes, or preservatives °pregnant or trying to get pregnant °breast-feeding °How should I use this medication? °This medicine is injected into a vein or under the skin. It is given by a health care provider in a hospital or clinic setting. °Talk to your health care provider about the use of this medicine in children. Special care may be needed. °Overdosage: If you think you have taken too much of this medicine contact a poison control center or emergency room at once. °NOTE: This medicine is only for you. Do not share this medicine with others. °What if I miss a dose? °Keep appointments for follow-up doses. It is important not to miss your dose. Call your health care provider if you are unable to keep an appointment. °What may interact with this medication? °This medicine may interact with the following medications: °ketoconazole °rifampin °This list may not describe all possible interactions. Give your health care provider a list of all the medicines, herbs, non-prescription drugs, or dietary supplements you use. Also tell them if you smoke, drink alcohol, or use illegal drugs. Some items may interact with your medicine. °What should I watch for while using this medication? °Your condition will be monitored carefully while you are receiving this medicine. °You may need blood work done while you are taking this medicine. °You may get drowsy or dizzy. Do not drive, use machinery, or do anything that needs mental alertness until you know how this medicine affects you. Do not stand up or sit up quickly, especially if you are an older patient. This reduces the risk of dizzy or fainting spells °This medicine may increase your risk of getting an infection. Call your health care provider for advice if you get a fever, chills, sore throat, or other symptoms of a cold or flu. Do not treat yourself. Try to avoid being around people who are sick. °Check with your  health care provider if you have severe diarrhea, nausea, and vomiting, or if you sweat a lot. The loss of too much body fluid may make it dangerous for you to take this medicine. °Do not become pregnant while taking this medicine or for 7 months after stopping it. Women should inform their health care provider if they wish to become pregnant or think they might be pregnant. Men should not father a child while taking this medicine and for 4 months after stopping it. There is a potential for serious harm to an unborn child. Talk to your health care provider for more information. Do not breast-feed an infant while taking this medicine or for 2 months after stopping it. °This medicine may make it more difficult to get pregnant or father a child. Talk to your health care provider if you are concerned about your fertility. °What side effects may I notice from receiving this medication? °Side effects that you should report to your doctor or health care professional as soon as possible: °allergic reactions (skin rash; itching or hives; swelling of the face, lips, or tongue) °bleeding (bloody or black, tarry stools; red or dark brown urine; spitting up blood or brown material that looks like coffee grounds; red spots on the skin; unusual bruising or bleeding   bruising or bleeding from the eye, gums, or nose) blurred vision or changes in vision confusion constipation headache heart failure (trouble breathing; fast, irregular heartbeat; sudden weight gain; swelling of the ankles, feet, hands) infection (fever, chills, cough, sore throat, pain or trouble passing urine) lack or loss of appetite liver injury (dark yellow or brown urine; general ill feeling or flu-like symptoms; loss of appetite, right upper belly pain; yellowing of the eyes or skin) low blood pressure (dizziness; feeling faint or lightheaded, falls; unusually weak or tired) muscle cramps pain, redness, or irritation at site where injected pain, tingling, numbness in the  hands or feet seizures trouble breathing unusual bruising or bleeding Side effects that usually do not require medical attention (report to your doctor or health care professional if they continue or are bothersome): diarrhea nausea stomach pain trouble sleeping vomiting This list may not describe all possible side effects. Call your doctor for medical advice about side effects. You may report side effects to FDA at 1-800-FDA-1088. Where should I keep my medication? This medicine is given in a hospital or clinic. It will not be stored at home. NOTE: This sheet is a summary. It may not cover all possible information. If you have questions about this medicine, talk to your doctor, pharmacist, or health care provider.  2022 Elsevier/Gold Standard (2020-07-19 00:00:00)

## 2021-10-21 ENCOUNTER — Telehealth: Payer: Self-pay

## 2021-10-21 LAB — MULTIPLE MYELOMA PANEL, SERUM
Albumin SerPl Elph-Mcnc: 3.5 g/dL (ref 2.9–4.4)
Albumin/Glob SerPl: 0.7 (ref 0.7–1.7)
Alpha 1: 0.3 g/dL (ref 0.0–0.4)
Alpha2 Glob SerPl Elph-Mcnc: 0.9 g/dL (ref 0.4–1.0)
B-Globulin SerPl Elph-Mcnc: 4.1 g/dL — ABNORMAL HIGH (ref 0.7–1.3)
Gamma Glob SerPl Elph-Mcnc: 0.2 g/dL — ABNORMAL LOW (ref 0.4–1.8)
Globulin, Total: 5.5 g/dL — ABNORMAL HIGH (ref 2.2–3.9)
IgA: 19 mg/dL — ABNORMAL LOW (ref 64–422)
IgG (Immunoglobin G), Serum: 3085 mg/dL — ABNORMAL HIGH (ref 586–1602)
IgM (Immunoglobulin M), Srm: 19 mg/dL — ABNORMAL LOW (ref 26–217)
M Protein SerPl Elph-Mcnc: 3.2 g/dL — ABNORMAL HIGH
Total Protein ELP: 9 g/dL — ABNORMAL HIGH (ref 6.0–8.5)

## 2021-10-21 LAB — KAPPA/LAMBDA LIGHT CHAINS
Kappa free light chain: 15.5 mg/L (ref 3.3–19.4)
Kappa, lambda light chain ratio: 0.66 (ref 0.26–1.65)
Lambda free light chains: 23.5 mg/L (ref 5.7–26.3)

## 2021-10-21 NOTE — Telephone Encounter (Signed)
-----   Message from Derrick Ravel, RN sent at 10/18/2021  3:40 PM EST ----- ?Regarding: First time Velcade injection , Dr. Lorenso Courier patient. ?First time velcade injection, Dr. Lorenso Courier patient. She tolerated injection well with no complaints. Please give follow up phone call, thank you! ? ?

## 2021-10-21 NOTE — Telephone Encounter (Signed)
Husband [name] stated that [name] Page is doing well. ?She is resting at the moment. ?She has not had any pain or nausea from the injection. ?He states that she is eating, drinking and urinating well. ?He has the office number [phone] if he has any questions or concerns. ?

## 2021-10-22 ENCOUNTER — Other Ambulatory Visit: Payer: Self-pay

## 2021-10-22 ENCOUNTER — Ambulatory Visit: Payer: Medicare PPO | Admitting: Internal Medicine

## 2021-10-22 ENCOUNTER — Encounter: Payer: Self-pay | Admitting: Internal Medicine

## 2021-10-22 VITALS — BP 130/72 | HR 64 | Temp 97.5°F | Ht 61.0 in | Wt 115.2 lb

## 2021-10-22 DIAGNOSIS — R0781 Pleurodynia: Secondary | ICD-10-CM | POA: Diagnosis not present

## 2021-10-22 DIAGNOSIS — D869 Sarcoidosis, unspecified: Secondary | ICD-10-CM

## 2021-10-22 DIAGNOSIS — J9819 Other pulmonary collapse: Secondary | ICD-10-CM | POA: Diagnosis not present

## 2021-10-22 NOTE — Patient Instructions (Addendum)
ICD-10-CM   ?1. Sarcoidosis  D86.9   ?  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  ?3. Rib pain on right side  R07.81   ?  ? ? ?Sarcoidosis appears stable and CXR 07/23/2021 shows continued resolution of right mddle lobe syndrome. No sarcoid symptoms. No cough. Last CT 2020 Oct ? ?Plan ? - Reduce prednisone to 1 mg/day ?- next visit aim to taper further to off ?- get HRCT next few days (last Oct 2020) ? ? ?T-spine wedge fracture - T7 and T9 ?Opioid requiring back pain with reduced mobility ?Bilateral humerus lytic lesions - JAn 2023 ?New Rt infraaxillary bony rib pain 10/22/2021 x 2 months ? ?- concerned for bony destruction of ribs ? ?Plan ? -HRCT chest above to capture information next few days ? ? ? ?Follow-up ?4 weeks video with APP to get you off prednisone  ?8-12 weeks with Meilyn Heindl ?

## 2021-10-22 NOTE — Progress Notes (Signed)
? ? ? ?OV 08/24/2014 ? ?Chief Complaint  ?Patient presents with  ? Follow-up  ?  Pt stated the gabapentin has not changed her breathing or cough. Pt c/o prod cough with white mucus. Pt denies SOB and CP/tightness.   ? 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 Main symptom is severe chronic cough with high RSI score suggestive of irritable larynx syndrome that is associated as well ? ?She always finds prednisone to help the cough but due to age and side effects despite good functional status she opts to take a low-dose of prednisone. Currently she is on 5 mg prednisone. In November 2015 due to RSI score of 27 for cough but decided to trial gabapentin to tackle the neurogenic component of cough. She reports now that the gabapentin has not helped her at all although objectively the RSI cough score has drop to 20. She is very frustrated. She and her husband are reluctant to have right middle lobectomy given her advanced age despite good functional status. There are no open to trying methotrexate to tackle the inflammation of sarcoidosis and as a steroid sparing agent. She also wants to come off gabapentin ? ? ?OV 10/02/2014 ? ?Chief Complaint  ?Patient presents with  ? Follow-up  ?  Pt states her breathing is doing well. Pt states her SOB has improved since last OV. Pt c/o mild cough. Pt denies CP/tightness. Pt stated being on methotrexate has significantly helped.   ? ? 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 Main symptom is severe chronic cough with high RSI score suggestive of irritable larynx syndrome that is associated as well ? ? ? ?Last visit in January 2016 she told me that gabapentin did not help her cough. So we stopped this. We started methotrexate for inflammation in her airway due to  sarcoid causing right middle lobe collapse. We left her on prednisone 5 mg per day and started her on  methotrexate 5 mg once a week. Also gave Hycodan cough syrup. She says that the Hycodan cough syrup did not help However, 2 weeks after starting her methotrexate she started noticing dramatic improvement in her cough. She hardly coughs now. She has significant improvement in her quality of life and she is extremely happy.  RSI cough score had dropped from 25 to 20 with gabapentin has now dropped to 5.  She is asking questions about how long to take methotrexate. And also review of the side effect profile. Of note I did notice that she is on ACE inhibitor that can potentially provoke all make her cough worse. She is not taking gabapentin anymore. There is no fever or chills. At this point in time she's not interested in Bactrim PCP prophylaxis which should be okay given low-dose of methotrexate and prednisone ? ?Past, Family, Social reviewed: no change since last visit ? ? ? ?OV 01/02/2015 ? ?Chief Complaint  ?Patient presents with  ? Follow-up  ?  Pt recently went to opthamologist and pts vision has decreased since last OV. Pt stated her breathing is unchanged since last OV. Pt c/o mild dry cough. Pt denies CP/tightness.   ? ? 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 Main symptom is severe chronic cough with high RSI score suggestive of irritable larynx syndrome that is associated as well ? ?Cough /Saarcoid / IRritiable LArynx/ RML syundrome - cough persists but is only mild and baseline.  No problems. Does not feel it impacts qualkity of life. She is off ace inhibitor now  ; forgot to mention this during med reconciliation. No asociated wheeze or dyspnea. Maintained on mehtotrxate '5mg'$  weekly, folic acid and prednisone '5mg'$  daily. No evidence of infection. Last LFT was 3 months ago and normal. Hycodan helping cough and wants refill ? ?NEw issue - her eye doc Dr Ellie Lunch called yesterday. PAtient has no complaints of vision but post capsulootmy few week ago and now  yesterday visual field deficit on Rt side is worse. Concern is ocular sarcoid is worse. They are referring her to [facility]. PAtient does not want step up Rx of sarcoid till all this is sorted out ? ?New issue - noticed some bruising in left forearm yesterday sponatenous x 2 spots. New. Asytmptomatcic and last 1 weeks some punctate red lesion in distal 1/3 of leg. Associatd with gardening but denies poison ivy. Unchanged since insidious onset. No associatd fever.  ? ? ?OV 04/09/2015 ? ?Chief Complaint  ?Patient presents with  ? Follow-up  ?  Pt denies cough, SOB, CP/tightness. Pt states her breathing is doing well. Pt states she had a recent rash on BIL lower extremities and saw derm and was given a topical ointment that helped. Per pt the derm thought the rash was not related to sarcoid.   ? ? 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 Main symptom is severe chronic cough with high RSI score suggestive of irritable larynx syndrome that is associated as well ? ? ?Cough /Saarcoid / IRritiable LArynx/ RML syundrome - cough is significantly improved. RSI cough score is only 14. She feels that the cough is at a stage where it is completely fine and she can handle things. She is maintained on methotrexate 5 mg weekly, folic acid and prednisone 5 mg daily. She did have some lowish white count after last visit and anemia but we held to the methotrexate and it seems to be stable. She also had a rash at the time of last visit she has seen dermatology and apparently the biopsies do not show this is due to sarcoid. I personally review this outside chart and confirmed that. Of note her last chest x-ray was November 2015 which showed continued right middle lobe collapse. The cough is controlled with the methotrexate and prednisone. She is no longer on ACE inhibitor even though our MAR reports that Also noted that she reports some mild weight gain with prednisone but she says she  will try dietary measures to shake this weight off. She does not want to cough to come and therefore she is content continue with the methotrexate and prednisone. In terms of her vision she is waiting for [facility] in October 2016. ? ? ? ?OV 07/16/2015 ? ?Chief Complaint  ?Patient presents with  ? Follow-up  ?  Pt states her breathing is unchanged but her cough has worsened. Pt states her SOB is at her baseline. Pt states her cough is nonproductive and worse in the evening. Pt denies CP/tightness.   ? ? 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 Main symptom is severe chronic cough with high RSI score suggestive of irritable larynx syndrome that is associated as well ? ? ?Cough /Saarcoid / IRritiable LArynx/ RML syundrome -she now reports that her cough is worse for the last few months especially since last visit. RSI cough score is 18 and subjectively  she feels that cough is worse by 25%. Otherwise she is okay. Cough is associated with worsening postnasal drip. She is also asking for refills on a cough syrup methotrexate folic acid. She prefers to have 90 day refills. Despite cough worsened she is not too keen on gabapentin therapy and wants to try other simpler measures at this point in time. There is no fever or sputum production that is associated with the cough. ? 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 No active follow-up. ? ? ?OV 10/18/2015 ? ? ?Chief Complaint  ?Patient presents with  ? Follow-up  ?  Pt states her cough has significantly improved. Pt denies SOB and CP/tightness. Pt states overall she feels she is doing well.   ? ? ?Follow-up chronic cough associated with right middle lobe syndrome secondary to sarcoidosis and irritable larynx syndrome ? ?Last visit December 2016. At the time cough that deteriorated. The significant amount  of postnasal drip. We instituted nasal steroids and with this cough is significantly improved and back to baseline. RSI cough score is 10 which is around her baseline. She feels immensely better. She is in a [employer]

## 2021-10-23 ENCOUNTER — Other Ambulatory Visit: Payer: Self-pay | Admitting: Physician Assistant

## 2021-10-23 DIAGNOSIS — C9000 Multiple myeloma not having achieved remission: Secondary | ICD-10-CM

## 2021-10-24 ENCOUNTER — Inpatient Hospital Stay: Payer: Medicare PPO

## 2021-10-24 ENCOUNTER — Ambulatory Visit: Payer: Medicare PPO | Admitting: Nurse Practitioner

## 2021-10-24 ENCOUNTER — Inpatient Hospital Stay: Payer: Medicare PPO | Admitting: Physician Assistant

## 2021-10-24 ENCOUNTER — Other Ambulatory Visit: Payer: Self-pay

## 2021-10-24 ENCOUNTER — Other Ambulatory Visit: Payer: Medicare PPO

## 2021-10-24 VITALS — BP 127/64 | HR 70 | Temp 97.5°F | Resp 18 | Wt 113.1 lb

## 2021-10-24 DIAGNOSIS — Z5112 Encounter for antineoplastic immunotherapy: Secondary | ICD-10-CM | POA: Diagnosis not present

## 2021-10-24 DIAGNOSIS — C9000 Multiple myeloma not having achieved remission: Secondary | ICD-10-CM | POA: Diagnosis not present

## 2021-10-24 DIAGNOSIS — Z800 Family history of malignant neoplasm of digestive organs: Secondary | ICD-10-CM | POA: Diagnosis not present

## 2021-10-24 DIAGNOSIS — R0781 Pleurodynia: Secondary | ICD-10-CM | POA: Diagnosis not present

## 2021-10-24 LAB — CMP (CANCER CENTER ONLY)
ALT: 9 U/L (ref 0–44)
AST: 17 U/L (ref 15–41)
Albumin: 3.7 g/dL (ref 3.5–5.0)
Alkaline Phosphatase: 141 U/L — ABNORMAL HIGH (ref 38–126)
Anion gap: 10 (ref 5–15)
BUN: 32 mg/dL — ABNORMAL HIGH (ref 8–23)
CO2: 21 mmol/L — ABNORMAL LOW (ref 22–32)
Calcium: 8.8 mg/dL — ABNORMAL LOW (ref 8.9–10.3)
Chloride: 104 mmol/L (ref 98–111)
Creatinine: 1.33 mg/dL — ABNORMAL HIGH (ref 0.44–1.00)
GFR, Estimated: 38 mL/min — ABNORMAL LOW (ref >60)
Glucose, Bld: 109 mg/dL — ABNORMAL HIGH (ref 70–99)
Potassium: 4.5 mmol/L (ref 3.5–5.1)
Sodium: 135 mmol/L (ref 135–145)
Total Bilirubin: 0.3 mg/dL (ref 0.3–1.2)
Total Protein: 9.1 g/dL — ABNORMAL HIGH (ref 6.5–8.1)

## 2021-10-24 LAB — CBC WITH DIFFERENTIAL (CANCER CENTER ONLY)
Abs Immature Granulocytes: 0.02 10*3/uL (ref 0.00–0.07)
Basophils Absolute: 0 10*3/uL (ref 0.0–0.1)
Basophils Relative: 0 %
Eosinophils Absolute: 0 10*3/uL (ref 0.0–0.5)
Eosinophils Relative: 1 %
HCT: 27 % — ABNORMAL LOW (ref 36.0–46.0)
Hemoglobin: 9.1 g/dL — ABNORMAL LOW (ref 12.0–15.0)
Immature Granulocytes: 1 %
Lymphocytes Relative: 22 %
Lymphs Abs: 0.9 10*3/uL (ref 0.7–4.0)
MCH: 34.5 pg — ABNORMAL HIGH (ref 26.0–34.0)
MCHC: 33.7 g/dL (ref 30.0–36.0)
MCV: 102.3 fL — ABNORMAL HIGH (ref 80.0–100.0)
Monocytes Absolute: 0.4 10*3/uL (ref 0.1–1.0)
Monocytes Relative: 10 %
Neutro Abs: 2.9 10*3/uL (ref 1.7–7.7)
Neutrophils Relative %: 66 %
Platelet Count: 262 10*3/uL (ref 150–400)
RBC: 2.64 MIL/uL — ABNORMAL LOW (ref 3.87–5.11)
RDW: 14.3 % (ref 11.5–15.5)
WBC Count: 4.3 10*3/uL (ref 4.0–10.5)
nRBC: 0 % (ref 0.0–0.2)

## 2021-10-24 LAB — LACTATE DEHYDROGENASE: LDH: 120 U/L (ref 98–192)

## 2021-10-24 MED ORDER — DEXAMETHASONE 4 MG PO TABS
40.0000 mg | ORAL_TABLET | Freq: Once | ORAL | Status: AC
  Administered 2021-10-24: 40 mg via ORAL
  Filled 2021-10-24: qty 10

## 2021-10-24 MED ORDER — OXYCODONE HCL 5 MG PO TABS: 5.0000 mg | ORAL_TABLET | Freq: Four times a day (QID) | ORAL | 0 refills | Status: DC | PRN

## 2021-10-24 MED ORDER — BORTEZOMIB CHEMO SQ INJECTION 3.5 MG (2.5MG/ML)
1.3000 mg/m2 | Freq: Once | INTRAMUSCULAR | Status: AC
  Administered 2021-10-24: 2 mg via SUBCUTANEOUS
  Filled 2021-10-24: qty 0.8

## 2021-10-24 NOTE — Patient Instructions (Signed)
[facility]  Discharge Instructions: ?Thank you for choosing [facility] to provide your oncology and hematology care.  ? ?If you have a lab appointment with the [facility], please go directly to the [facility] and check in at the registration area. ?  ?Wear comfortable clothing and clothing appropriate for easy access to any Portacath or PICC line.  ? ?We strive to give you quality time with your provider. You may need to reschedule your appointment if you arrive late (15 or more minutes).  Arriving late affects you and other patients whose appointments are after yours.  Also, if you miss three or more appointments without notifying the office, you may be dismissed from the clinic at the provider?s discretion.    ?  ?For prescription refill requests, have your pharmacy contact our office and allow 72 hours for refills to be completed.   ? ?Today you received the following chemotherapy and/or immunotherapy agents: Velcade    ?  ?To help prevent nausea and vomiting after your treatment, we encourage you to take your nausea medication as directed. ? ?BELOW ARE SYMPTOMS THAT SHOULD BE REPORTED IMMEDIATELY: ?*FEVER GREATER THAN 100.4 F (38 ?C) OR HIGHER ?*CHILLS OR SWEATING ?*NAUSEA AND VOMITING THAT IS NOT CONTROLLED WITH YOUR NAUSEA MEDICATION ?*UNUSUAL SHORTNESS OF BREATH ?*UNUSUAL BRUISING OR BLEEDING ?*URINARY PROBLEMS (pain or burning when urinating, or frequent urination) ?*BOWEL PROBLEMS (unusual diarrhea, constipation, pain near the anus) ?TENDERNESS IN MOUTH AND THROAT WITH OR WITHOUT PRESENCE OF ULCERS (sore throat, sores in mouth, or a toothache) ?UNUSUAL RASH, SWELLING OR PAIN  ?UNUSUAL VAGINAL DISCHARGE OR ITCHING  ? ?Items with * indicate a potential emergency and should be followed up as soon as possible or go to the Emergency Department if any problems should occur. ? ?Please show the CHEMOTHERAPY ALERT CARD or IMMUNOTHERAPY ALERT CARD at check-in to  the Emergency Department and triage nurse. ? ?Should you have questions after your visit or need to cancel or reschedule your appointment, please contact [facility]  Dept: [phone]  and follow the prompts.  Office hours are 8:00 a.m. to 4:30 p.m. Monday - Friday. Please note that voicemails left after 4:00 p.m. may not be returned until the following business day.  We are closed weekends and major holidays. You have access to a nurse at all times for urgent questions. Please call the main number to the clinic Dept: [phone] and follow the prompts. ? ? ?For any non-urgent questions, you may also contact your provider using MyChart. We now offer e-Visits for anyone 61 and older to request care online for non-urgent symptoms. For details visit [email or web address]. ?  ?Also download the MyChart app! Go to the app store, search "MyChart", open the app, select [facility], and log in with your MyChart username and password. ? ?Due to Covid, a mask is required upon entering the hospital/clinic. If you do not have a mask, one will be given to you upon arrival. For doctor visits, patients may have 1 support [name] Page aged 86 or older with them. For treatment visits, patients cannot have anyone with them due to current Covid guidelines and our immunocompromised population.  ? ?Bortezomib injection ?What is this medication? ?BORTEZOMIB (bor TEZ oh mib) targets proteins in cancer cells and stops the cancer cells from growing. It treats multiple myeloma and mantle cell lymphoma. ?This medicine may be used for other purposes; ask your health care provider or pharmacist if you have  questions. ?COMMON BRAND NAME(S): Velcade ?What should I tell my care team before I take this medication? ?They need to know if you have any of these conditions: ?dehydration ?diabetes (high blood sugar) ?heart disease ?liver disease ?tingling of the fingers or toes or other nerve disorder ?an unusual or allergic  reaction to bortezomib, mannitol, boron, other medicines, foods, dyes, or preservatives ?pregnant or trying to get pregnant ?breast-feeding ?How should I use this medication? ?This medicine is injected into a vein or under the skin. It is given by a health care provider in a hospital or clinic setting. ?Talk to your health care provider about the use of this medicine in children. Special care may be needed. ?Overdosage: If you think you have taken too much of this medicine contact a poison control center or emergency room at once. ?NOTE: This medicine is only for you. Do not share this medicine with others. ?What if I miss a dose? ?Keep appointments for follow-up doses. It is important not to miss your dose. Call your health care provider if you are unable to keep an appointment. ?What may interact with this medication? ?This medicine may interact with the following medications: ?ketoconazole ?rifampin ?This list may not describe all possible interactions. Give your health care provider a list of all the medicines, herbs, non-prescription drugs, or dietary supplements you use. Also tell them if you smoke, drink alcohol, or use illegal drugs. Some items may interact with your medicine. ?What should I watch for while using this medication? ?Your condition will be monitored carefully while you are receiving this medicine. ?You may need blood work done while you are taking this medicine. ?You may get drowsy or dizzy. Do not drive, use machinery, or do anything that needs mental alertness until you know how this medicine affects you. Do not stand up or sit up quickly, especially if you are an older patient. This reduces the risk of dizzy or fainting spells ?This medicine may increase your risk of getting an infection. Call your health care provider for advice if you get a fever, chills, sore throat, or other symptoms of a cold or flu. Do not treat yourself. Try to avoid being around people who are sick. ?Check with your  health care provider if you have severe diarrhea, nausea, and vomiting, or if you sweat a lot. The loss of too much body fluid may make it dangerous for you to take this medicine. ?Do not become pregnant while taking this medicine or for 7 months after stopping it. Women should inform their health care provider if they wish to become pregnant or think they might be pregnant. Men should not father a child while taking this medicine and for 4 months after stopping it. There is a potential for serious harm to an unborn child. Talk to your health care provider for more information. Do not breast-feed an infant while taking this medicine or for 2 months after stopping it. ?This medicine may make it more difficult to get pregnant or father a child. Talk to your health care provider if you are concerned about your fertility. ?What side effects may I notice from receiving this medication? ?Side effects that you should report to your doctor or health care professional as soon as possible: ?allergic reactions (skin rash; itching or hives; swelling of the face, lips, or tongue) ?bleeding (bloody or black, tarry stools; red or dark brown urine; spitting up blood or brown material that looks like coffee grounds; red spots on the skin; unusual  bruising or bleeding from the eye, gums, or nose) ?blurred vision or changes in vision ?confusion ?constipation ?headache ?heart failure (trouble breathing; fast, irregular heartbeat; sudden weight gain; swelling of the ankles, feet, hands) ?infection (fever, chills, cough, sore throat, pain or trouble passing urine) ?lack or loss of appetite ?liver injury (dark yellow or brown urine; general ill feeling or flu-like symptoms; loss of appetite, right upper belly pain; yellowing of the eyes or skin) ?low blood pressure (dizziness; feeling faint or lightheaded, falls; unusually weak or tired) ?muscle cramps ?pain, redness, or irritation at site where injected ?pain, tingling, numbness in the  hands or feet ?seizures ?trouble breathing ?unusual bruising or bleeding ?Side effects that usually do not require medical attention (report to your doctor or health care professional if they continue or

## 2021-10-24 NOTE — Progress Notes (Signed)
?[employer] ?[phone]   [phone] ? ?PROGRESS NOTE ? ?Patient Care Team: ?Burnard Bunting, MD as PCP - General (Internal Medicine) ? ?Hematological/Oncological History ?# IgG Lambda Multiple Myeloma ?06/04/2021: WBC 5.5, Hgb 11.1, MCV 101.3, Plt 306 ?07/24/2021: M protein 2.3, IFE shows IgG Lambda monoclonal specificity. Cr 0.9 ?08/07/2021: Establish care with Dr. Lorenso Courier ?09/27/2021: Bmbx showed hypercellular bone marrow with plasma cell neoplasm, increased number of atypical plasma cells averaging 20% of all cells in the aspirate ?10/18/2021: Cycle 1 of Velcade/Dexamethasone ? ? ?Interval History:  ?[name] Page 88 y.o. female with medical history significant for newly diagnosed IgG lambda multiple myeloma who presents for a follow up visit. The patient's last visit was on 10/04/2020. In the interim, patient started Cycle 1 of Velcade/Dexamethasone. She is due for Cycle 1, Day 8.  ? ?On exam today [name] Page is accompanied by her husband.  She reports that her energy levels are fairly stable.  She had some mild fatigue the day after her first treatment which improved on its own.  She is able to complete her daily routines independently.  Patient uses a walker to help ambulate.  She has lost 2-3 pounds since the last visit.  She denies any nausea, vomiting or abdominal pain.  Her bowel habits are unchanged without any diarrhea or constipation.  Patient denies easy bruising or signs of active bleeding.  She reports worsening right sided rib pain that has been present for 2 months but has worsened in frequency and intensity.  She takes over-the-counter analgesics including naproxen with minimal relief.  She denies any fevers, chills, night sweats, shortness of breath, chest pain or cough.  She has no other complaints.  Rest of the 10 point ROS is below ? ?MEDICAL HISTORY:  ?Past Medical History:  ?Diagnosis Date  ? Alopecia 10/28/2016  ? Anginal pain ([place])   ? Arthritis   ?  Cervical spondylosis   ? C3-4 AND C4-5  ? Chest pain 07/28/2008  ? H/O, normal stress nuclear EF 78%  ? Chronic cough 07/12/2014  ? Collapsed lung 07/2010  ? being [facility] at [facility] - in left lung  ? Complication of anesthesia   ? Cough 07/30/2010  ? Qualifier: Diagnosis of  By: Chase Caller MD, Murali    ? Cystocele 05/23/2015  ? Cystocele, midline 05/02/2013  ? DEEP VEIN THROMBOSIS/PHLEBITIS 07/30/2010  ? Qualifier: History of  By: Harvest Dark CMA, Anderson Malta    ? Dislocation closed, shoulder 02/2013  ? DVT (deep venous thrombosis) ([place]) 2006  ? Encounter for therapeutic drug monitoring 10/03/2014  ? GERD (gastroesophageal reflux disease)   ? occ  ? Headache   ? hx  ? High cholesterol   ? History of recurrent UTIs 09/24/2011  ? HYPERLIPIDEMIA 07/30/2010  ? Qualifier: Diagnosis of  By: Harvest Dark CMA, Anderson Malta    ? Hypertension   ? HYPERTENSION 07/30/2010  ? Qualifier: Diagnosis of  By: Harvest Dark CMA, Anderson Malta    ? Hypothyroidism   ? Nasal itching 01/30/2014  ? Osteopenia 05/23/2015  ? Peripheral vascular disease ([place]) 2006  ? dvt's and 50 yrs ago  ? Pleuritic pain 12/17/2015  ? PONV (postoperative nausea and vomiting)   ? Primary localized osteoarthritis of right hip 08/26/2016  ? Primary osteoarthritis of right hip 08/26/2016  ? Pulmonary collapse 07/30/2010  ? Qualifier: Diagnosis of  By: Chase Caller MD, Murali    ? PULMONARY NODULE 07/30/2010  ? Qualifier: Diagnosis of  By: Chase Caller MD, Murali    ? Rash  and nonspecific skin eruption 01/02/2015  ? 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 Sarcoidosis   ? Sarcoidosis of lung ([place])   ? Vaginal atrophy 09/24/2011  ? Visual field defect nasal step 01/02/2015  ? ? ?SURGICAL HISTORY: ?Past Surgical History:  ?Procedure Laterality Date  ? BACK SURGERY  2022  ? EYE SURGERY    ? FOOT SURGERY Left 06/2005  ? bunion hammer toe  ? HEMORRHOID SURGERY  12/1972  ? INTRAOCULAR LENS INSERTION    ? right eye 10 /16 and left 06/16/2005  ? JOINT REPLACEMENT    ? KYPHOPLASTY N/A 06/27/2021  ?  Procedure: LUMBAR 3 AND LUMBAR  5 KYPHOPLASTY;  Surgeon: Phylliss Bob, MD;  Location: [facility];  Service: Orthopedics;  Laterality: N/A;  ? KYPHOPLASTY N/A 09/04/2021  ? Procedure: THORACIC SEVEN, THORACIC EIGHT, THORACIC NINE, THORACIC TEN KYPHOPLASTY;  Surgeon: Phylliss Bob, MD;  Location: [facility];  Service: Orthopedics;  Laterality: N/A;  ? SKIN BIOPSY  01/2007  ? basal cell carcinoma removed from right lower leg   ? TOTAL HIP ARTHROPLASTY  06/2007  ? left  ? TOTAL HIP ARTHROPLASTY Right 08/26/2016  ? Procedure: TOTAL HIP ARTHROPLASTY ANTERIOR APPROACH;  Surgeon: Melrose Nakayama, MD;  Location: [facility];  Service: Orthopedics;  Laterality: Right;  ? TOTAL KNEE ARTHROPLASTY Right 10/2007  ? right  ? ? ?SOCIAL HISTORY: ?Social History  ? ?Socioeconomic History  ? Marital status: Married  ?  Spouse name: Not on file  ? Number of children: Not on file  ? Years of education: Not on file  ? Highest education level: Not on file  ?Occupational History  ? Occupation: retired  ?  Employer: RETIRED  ?Tobacco Use  ? Smoking status: Never  ? Smokeless tobacco: Never  ?Vaping Use  ? Vaping Use: Never used  ?Substance and Sexual Activity  ? Alcohol use: Not Currently  ?  Comment: BEER/glass of wine A DAY  ? Drug use: No  ? Sexual activity: Never  ?Other Topics Concern  ? Not on file  ?Social History Narrative  ? Not on file  ? ?Social Determinants of Health  ? ?Financial Resource Strain: Not on file  ?Food Insecurity: Not on file  ?Transportation Needs: Not on file  ?Physical Activity: Not on file  ?Stress: Not on file  ?Social Connections: Not on file  ?Intimate Partner Violence: Not on file  ? ? ?FAMILY HISTORY: ?Family History  ?Problem Relation Age of Onset  ? Diabetes Mother   ? Heart failure Mother   ? Hypertension Father   ? Diabetes Father   ? Heart disease Father   ? Heart attack Father   ? ? ?ALLERGIES:  is allergic to methotrexate derivatives. ? ?MEDICATIONS:  ?Current Outpatient Medications  ?Medication Sig Dispense  Refill  ? acyclovir (ZOVIRAX) 400 MG tablet Take 1 tablet (400 mg total) by mouth 2 (two) times daily. 60 tablet 2  ? amLODipine (NORVASC) 10 MG tablet Take 10 mg by mouth daily.    ? azelastine (OPTIVAR) 0.05 % ophthalmic solution Place 1 drop into both eyes 2 (two) times daily.     ? Calcium Citrate-Vitamin D (CALCIUM + D PO) Take 1 tablet by mouth daily.    ? Cholecalciferol (VITAMIN D-1000 MAX ST) 25 MCG (1000 UT) tablet Take 1,000 Units by mouth daily.    ? gemfibrozil (LOPID) 600 MG tablet Take 600 mg by mouth 2 (two) times daily before a meal.    ? levothyroxine (SYNTHROID, LEVOTHROID) 25 MCG  tablet Take 25 mcg by mouth daily.     ? LORazepam (ATIVAN) 0.5 MG tablet Take 0.5 mg by mouth daily as needed for anxiety or sleep.    ? losartan (COZAAR) 50 MG tablet Take 50 mg by mouth daily.    ? Multiple Vitamin (MULTIVITAMIN) capsule Take 1 capsule by mouth daily.    ? ondansetron (ZOFRAN) 8 MG tablet Take 1 tablet (8 mg total) by mouth every 8 (eight) hours as needed. 30 tablet 0  ? oxyCODONE (ROXICODONE) 5 MG immediate release tablet Take 1 tablet (5 mg total) by mouth every 6 (six) hours as needed for severe pain. 30 tablet 0  ? pantoprazole (PROTONIX) 20 MG tablet Take 20 mg by mouth daily.    ? polyethylene glycol powder (GLYCOLAX/MIRALAX) 17 GM/SCOOP powder Take 17 g by mouth at bedtime.    ? predniSONE (DELTASONE) 1 MG tablet Take 2 tablets (2 mg total) by mouth daily with breakfast. (Patient taking differently: Take 1 mg by mouth daily with breakfast.) 90 tablet 3  ? prochlorperazine (COMPAZINE) 10 MG tablet Take 1 tablet (10 mg total) by mouth every 6 (six) hours as needed for nausea or vomiting. 30 tablet 0  ? ?No current facility-administered medications for this visit.  ? ?Facility-Administered Medications Ordered in Other Visits  ?Medication Dose Route Frequency Provider Last Rate Last Admin  ? bortezomib SQ (VELCADE) chemo injection (2.49m/mL concentration) 2 mg  1.3 mg/m2 (Treatment Plan Recorded)  Subcutaneous Once DOrson Slick MD      ? dexamethasone (DECADRON) tablet 40 mg  40 mg Oral Once DOrson Slick MD      ? ? ?REVIEW OF SYSTEMS:   ?Constitutional: ( - ) fevers, ( - )  chills , ( - ) ni

## 2021-10-25 ENCOUNTER — Ambulatory Visit (INDEPENDENT_AMBULATORY_CARE_PROVIDER_SITE_OTHER)
Admission: RE | Admit: 2021-10-25 | Discharge: 2021-10-25 | Disposition: A | Discharge status: Home / Self Care | Payer: Medicare PPO | Source: Ambulatory Visit | Attending: Internal Medicine | Admitting: Internal Medicine

## 2021-10-25 DIAGNOSIS — S22060A Wedge compression fracture of T7-T8 vertebra, initial encounter for closed fracture: Secondary | ICD-10-CM | POA: Diagnosis not present

## 2021-10-25 DIAGNOSIS — J9811 Atelectasis: Secondary | ICD-10-CM | POA: Diagnosis not present

## 2021-10-25 DIAGNOSIS — R0781 Pleurodynia: Secondary | ICD-10-CM

## 2021-10-25 DIAGNOSIS — S22050A Wedge compression fracture of T5-T6 vertebra, initial encounter for closed fracture: Secondary | ICD-10-CM | POA: Diagnosis not present

## 2021-10-25 DIAGNOSIS — D869 Sarcoidosis, unspecified: Secondary | ICD-10-CM

## 2021-10-27 ENCOUNTER — Telehealth: Payer: Self-pay | Admitting: Internal Medicine

## 2021-10-27 NOTE — Telephone Encounter (Signed)
?  Sarcoid stable/some better ? ?RT shoulder join has effusion ? ?No clear explanation for right side pain ? ?CT Chest High Resolution ? ?Result Date: 10/27/2021 ?CLINICAL DATA:  Follow-up sarcoidosis. EXAM: CT CHEST WITHOUT CONTRAST TECHNIQUE: Multidetector CT imaging of the chest was performed following the standard protocol without intravenous contrast. High resolution imaging of the lungs, as well as inspiratory and expiratory imaging, was performed. RADIATION DOSE REDUCTION: This exam was performed according to the departmental dose-optimization program which includes automated exposure control, adjustment of the mA and/or kV according to patient size and/or use of iterative reconstruction technique. COMPARISON:  06/01/2019 chest CT. FINDINGS: Motion degraded scan, limiting assessment. Cardiovascular: Mild cardiomegaly, slightly increased. No significant pericardial effusion/thickening. Three-vessel coronary atherosclerosis. Atherosclerotic nonaneurysmal thoracic aorta. Normal caliber pulmonary arteries. Mediastinum/Nodes: No discrete thyroid nodules. Unremarkable esophagus. No pathologically enlarged axillary, mediastinal or hilar lymph nodes, noting limited sensitivity for the detection of hilar adenopathy on this noncontrast study. Lungs/Pleura: No pneumothorax. No pleural effusion. Complete right middle lobe atelectasis, unchanged. No acute consolidative airspace disease or lung masses. Solid 0.8 cm medial apical left upper lobe pulmonary nodule (series 3/image 24), stable. Stable insert scattered medial left upper lobe subcentimeter granulomas. No new significant pulmonary nodules. No significant lobular air trapping or evidence of tracheobronchomalacia on the expiration sequence. Mild finely nodular thickening of the peribronchovascular interstitium in the right lower lobe, decreased. Mild cylindrical bronchiectasis in the right lower lobe is mildly increased. No significant regions of subpleural  reticulation. No frank honeycombing. Thin scattered parenchymal bands at the lung bases bilaterally. Upper abdomen: Chronic moderate eventration of the anterior right hemidiaphragm. Cholelithiasis. Musculoskeletal: No aggressive appearing focal osseous lesions. Partially visualized right glenohumeral joint effusion. Chronic mild T3 and T4, severe T6, severe T7, severe T8, severe T9 and moderate T10 vertebral compression fractures, status vertebroplasty at T7, T8, T9 and T10. Mild thoracic spondylosis. IMPRESSION: 1. Mild finely nodular thickening of the peribronchovascular interstitium in the right lower lobe, decreased. Mild cylindrical bronchiectasis in the right lower lobe is mildly increased. Findings are compatible with mild pulmonary sarcoidosis, overall improved. No new or progressive disease. 2. Chronic complete right middle lobe atelectasis. 3. Mild cardiomegaly, slightly increased. Three-vessel coronary atherosclerosis. 4. Cholelithiasis. 5. Chronic moderate eventration of the anterior right hemidiaphragm. 6. Chronic multilevel thoracic vertebral compression fractures. 7. Partially visualized right glenohumeral joint effusion. 8. Aortic Atherosclerosis (ICD10-I70.0). Electronically Signed   By: Ilona Sorrel M.D.   On: 10/27/2021 17:41   ? ?

## 2021-10-28 NOTE — Telephone Encounter (Signed)
Called and spoke with pt letting her know the results of the HRCT and she verbalized understanding. Nothing further needed. ?

## 2021-10-31 ENCOUNTER — Other Ambulatory Visit: Payer: Self-pay | Admitting: Physician Assistant

## 2021-10-31 DIAGNOSIS — C9000 Multiple myeloma not having achieved remission: Secondary | ICD-10-CM

## 2021-10-31 DIAGNOSIS — H04123 Dry eye syndrome of bilateral lacrimal glands: Secondary | ICD-10-CM | POA: Diagnosis not present

## 2021-10-31 DIAGNOSIS — S0501XA Injury of conjunctiva and corneal abrasion without foreign body, right eye, initial encounter: Secondary | ICD-10-CM | POA: Diagnosis not present

## 2021-10-31 DIAGNOSIS — H35371 Puckering of macula, right eye: Secondary | ICD-10-CM | POA: Diagnosis not present

## 2021-11-01 ENCOUNTER — Inpatient Hospital Stay: Payer: Medicare PPO

## 2021-11-01 ENCOUNTER — Inpatient Hospital Stay: Payer: Medicare PPO | Admitting: Dietician

## 2021-11-01 ENCOUNTER — Inpatient Hospital Stay (HOSPITAL_BASED_OUTPATIENT_CLINIC_OR_DEPARTMENT_OTHER): Payer: Medicare PPO | Admitting: Physician Assistant

## 2021-11-01 ENCOUNTER — Other Ambulatory Visit: Payer: Self-pay

## 2021-11-01 VITALS — BP 146/65 | HR 73 | Temp 98.1°F | Resp 16 | Ht 61.0 in | Wt 114.0 lb

## 2021-11-01 DIAGNOSIS — Z5112 Encounter for antineoplastic immunotherapy: Secondary | ICD-10-CM | POA: Diagnosis not present

## 2021-11-01 DIAGNOSIS — I4510 Unspecified right bundle-branch block: Secondary | ICD-10-CM | POA: Insufficient documentation

## 2021-11-01 DIAGNOSIS — F419 Anxiety disorder, unspecified: Secondary | ICD-10-CM | POA: Insufficient documentation

## 2021-11-01 DIAGNOSIS — Z9889 Other specified postprocedural states: Secondary | ICD-10-CM | POA: Insufficient documentation

## 2021-11-01 DIAGNOSIS — Z800 Family history of malignant neoplasm of digestive organs: Secondary | ICD-10-CM | POA: Diagnosis not present

## 2021-11-01 DIAGNOSIS — I82409 Acute embolism and thrombosis of unspecified deep veins of unspecified lower extremity: Secondary | ICD-10-CM | POA: Insufficient documentation

## 2021-11-01 DIAGNOSIS — L989 Disorder of the skin and subcutaneous tissue, unspecified: Secondary | ICD-10-CM

## 2021-11-01 DIAGNOSIS — K5904 Chronic idiopathic constipation: Secondary | ICD-10-CM | POA: Insufficient documentation

## 2021-11-01 DIAGNOSIS — C9000 Multiple myeloma not having achieved remission: Secondary | ICD-10-CM

## 2021-11-01 DIAGNOSIS — M8448XA Pathological fracture, other site, initial encounter for fracture: Secondary | ICD-10-CM | POA: Insufficient documentation

## 2021-11-01 DIAGNOSIS — G4700 Insomnia, unspecified: Secondary | ICD-10-CM | POA: Insufficient documentation

## 2021-11-01 LAB — CMP (CANCER CENTER ONLY)
ALT: 8 U/L (ref 0–44)
AST: 16 U/L (ref 15–41)
Albumin: 3.7 g/dL (ref 3.5–5.0)
Alkaline Phosphatase: 181 U/L — ABNORMAL HIGH (ref 38–126)
Anion gap: 10 (ref 5–15)
BUN: 25 mg/dL — ABNORMAL HIGH (ref 8–23)
CO2: 20 mmol/L — ABNORMAL LOW (ref 22–32)
Calcium: 9 mg/dL (ref 8.9–10.3)
Chloride: 102 mmol/L (ref 98–111)
Creatinine: 0.89 mg/dL (ref 0.44–1.00)
GFR, Estimated: >60 mL/min (ref >60)
Glucose, Bld: 128 mg/dL — ABNORMAL HIGH (ref 70–99)
Potassium: 3.9 mmol/L (ref 3.5–5.1)
Sodium: 132 mmol/L — ABNORMAL LOW (ref 135–145)
Total Bilirubin: 0.4 mg/dL (ref 0.3–1.2)
Total Protein: 8.2 g/dL — ABNORMAL HIGH (ref 6.5–8.1)

## 2021-11-01 LAB — CBC WITH DIFFERENTIAL (CANCER CENTER ONLY)
Abs Immature Granulocytes: 0.01 10*3/uL (ref 0.00–0.07)
Basophils Absolute: 0 10*3/uL (ref 0.0–0.1)
Basophils Relative: 0 %
Eosinophils Absolute: 0 10*3/uL (ref 0.0–0.5)
Eosinophils Relative: 1 %
HCT: 26.8 % — ABNORMAL LOW (ref 36.0–46.0)
Hemoglobin: 9.2 g/dL — ABNORMAL LOW (ref 12.0–15.0)
Immature Granulocytes: 0 %
Lymphocytes Relative: 17 %
Lymphs Abs: 0.8 10*3/uL (ref 0.7–4.0)
MCH: 34.6 pg — ABNORMAL HIGH (ref 26.0–34.0)
MCHC: 34.3 g/dL (ref 30.0–36.0)
MCV: 100.8 fL — ABNORMAL HIGH (ref 80.0–100.0)
Monocytes Absolute: 0.5 10*3/uL (ref 0.1–1.0)
Monocytes Relative: 10 %
Neutro Abs: 3.2 10*3/uL (ref 1.7–7.7)
Neutrophils Relative %: 72 %
Platelet Count: 244 10*3/uL (ref 150–400)
RBC: 2.66 MIL/uL — ABNORMAL LOW (ref 3.87–5.11)
RDW: 14.2 % (ref 11.5–15.5)
WBC Count: 4.4 10*3/uL (ref 4.0–10.5)
nRBC: 0 % (ref 0.0–0.2)

## 2021-11-01 LAB — LACTATE DEHYDROGENASE: LDH: 140 U/L (ref 98–192)

## 2021-11-01 MED ORDER — DEXAMETHASONE 4 MG PO TABS
40.0000 mg | ORAL_TABLET | Freq: Once | ORAL | Status: AC
  Administered 2021-11-01: 40 mg via ORAL
  Filled 2021-11-01: qty 10

## 2021-11-01 MED ORDER — BORTEZOMIB CHEMO SQ INJECTION 3.5 MG (2.5MG/ML)
1.3000 mg/m2 | Freq: Once | INTRAMUSCULAR | Status: AC
  Administered 2021-11-01: 2 mg via SUBCUTANEOUS
  Filled 2021-11-01: qty 0.8

## 2021-11-01 NOTE — Patient Instructions (Signed)
[facility]  Discharge Instructions: ?Thank you for choosing [facility] to provide your oncology and hematology care.  ? ?If you have a lab appointment with the [facility], please go directly to the [facility] and check in at the registration area. ?  ?Wear comfortable clothing and clothing appropriate for easy access to any Portacath or PICC line.  ? ?We strive to give you quality time with your provider. You may need to reschedule your appointment if you arrive late (15 or more minutes).  Arriving late affects you and other patients whose appointments are after yours.  Also, if you miss three or more appointments without notifying the office, you may be dismissed from the clinic at the provider?s discretion.    ?  ?For prescription refill requests, have your pharmacy contact our office and allow 72 hours for refills to be completed.   ? ?Today you received the following chemotherapy and/or immunotherapy agents Velcade SubQ Injection (Left lower abdomen)    ?  ?To help prevent nausea and vomiting after your treatment, we encourage you to take your nausea medication as directed. ? ?BELOW ARE SYMPTOMS THAT SHOULD BE REPORTED IMMEDIATELY: ?*FEVER GREATER THAN 100.4 F (38 ?C) OR HIGHER ?*CHILLS OR SWEATING ?*NAUSEA AND VOMITING THAT IS NOT CONTROLLED WITH YOUR NAUSEA MEDICATION ?*UNUSUAL SHORTNESS OF BREATH ?*UNUSUAL BRUISING OR BLEEDING ?*URINARY PROBLEMS (pain or burning when urinating, or frequent urination) ?*BOWEL PROBLEMS (unusual diarrhea, constipation, pain near the anus) ?TENDERNESS IN MOUTH AND THROAT WITH OR WITHOUT PRESENCE OF ULCERS (sore throat, sores in mouth, or a toothache) ?UNUSUAL RASH, SWELLING OR PAIN  ?UNUSUAL VAGINAL DISCHARGE OR ITCHING  ? ?Items with * indicate a potential emergency and should be followed up as soon as possible or go to the Emergency Department if any problems should occur. ? ?Please show the CHEMOTHERAPY ALERT CARD or  IMMUNOTHERAPY ALERT CARD at check-in to the Emergency Department and triage nurse. ? ?Should you have questions after your visit or need to cancel or reschedule your appointment, please contact [facility]  Dept: [phone]  and follow the prompts.  Office hours are 8:00 a.m. to 4:30 p.m. Monday - Friday. Please note that voicemails left after 4:00 p.m. may not be returned until the following business day.  We are closed weekends and major holidays. You have access to a nurse at all times for urgent questions. Please call the main number to the clinic Dept: [phone] and follow the prompts. ? ? ?For any non-urgent questions, you may also contact your provider using MyChart. We now offer e-Visits for anyone 32 and older to request care online for non-urgent symptoms. For details visit [email or web address]. ?  ?Also download the MyChart app! Go to the app store, search "MyChart", open the app, select [facility], and log in with your MyChart username and password. ? ?Due to Covid, a mask is required upon entering the hospital/clinic. If you do not have a mask, one will be given to you upon arrival. For doctor visits, patients may have 1 support person aged 68 or older with them. For treatment visits, patients cannot have anyone with them due to current Covid guidelines and our immunocompromised population.  ? ?

## 2021-11-01 NOTE — Progress Notes (Signed)
I saw the patient in infusion today. She has a small single pustular appearing lesion that she noticed on her right forearm today. It measures a few mm's. The lesion is flat. No abscess. There is no drainage but appears to be some fluid very superficial in the lesion that looks like it may drain soon. Denies associated pain, swelling, tenderness, itching,  heat, or erythema. She denies any trauma/falls or damage to the skin, although there are some bruises near the lesion. She does have a history of sarcoidosis but denies having a similar appearing lesion. She reports she is not able to put adhesives on her skin due to sensitive skin. She denies systemic symptoms such as fevers or chills. I recommended she use antibiotic ointment such as mupirocin. The area is very tiny and superficial. I do not feel she needs any systemic antibiotics. She states she has some at home. I did look back in her medication history and did confirm that this was prescribed previously. I encouraged her to use this on a q tip 2-3x per day until resolution. She is worried the area may "burst" while she is sleeping. Discussed she can put gauze over the area. She asked if she could wrap the area with a bandage. I told her I would not recommend sleeping with a bandage wrapped around her arm due to circulation. She is in agreement with this plan. However, I cautioned her if she develops new or worsening symptoms such as fevers, increased erythema, swelling, heat, pain, etc, to please call us back for re-evaluation.  ?

## 2021-11-04 DIAGNOSIS — H1820 Unspecified corneal edema: Secondary | ICD-10-CM | POA: Diagnosis not present

## 2021-11-04 DIAGNOSIS — S0501XD Injury of conjunctiva and corneal abrasion without foreign body, right eye, subsequent encounter: Secondary | ICD-10-CM | POA: Diagnosis not present

## 2021-11-07 ENCOUNTER — Other Ambulatory Visit: Payer: Self-pay | Admitting: Physician Assistant

## 2021-11-07 DIAGNOSIS — C9000 Multiple myeloma not having achieved remission: Secondary | ICD-10-CM

## 2021-11-08 ENCOUNTER — Inpatient Hospital Stay: Payer: Medicare PPO | Admitting: Physician Assistant

## 2021-11-08 ENCOUNTER — Other Ambulatory Visit: Payer: Self-pay

## 2021-11-08 ENCOUNTER — Inpatient Hospital Stay: Payer: Medicare PPO | Admitting: Dietician

## 2021-11-08 ENCOUNTER — Inpatient Hospital Stay: Payer: Medicare PPO

## 2021-11-08 VITALS — BP 142/66 | HR 64 | Temp 97.8°F | Wt 114.3 lb

## 2021-11-08 DIAGNOSIS — C9000 Multiple myeloma not having achieved remission: Secondary | ICD-10-CM

## 2021-11-08 DIAGNOSIS — Z5112 Encounter for antineoplastic immunotherapy: Secondary | ICD-10-CM | POA: Diagnosis not present

## 2021-11-08 DIAGNOSIS — D649 Anemia, unspecified: Secondary | ICD-10-CM | POA: Diagnosis not present

## 2021-11-08 DIAGNOSIS — Z800 Family history of malignant neoplasm of digestive organs: Secondary | ICD-10-CM | POA: Diagnosis not present

## 2021-11-08 LAB — CBC WITH DIFFERENTIAL (CANCER CENTER ONLY)
Abs Immature Granulocytes: 0.01 10*3/uL (ref 0.00–0.07)
Basophils Absolute: 0 10*3/uL (ref 0.0–0.1)
Basophils Relative: 0 %
Eosinophils Absolute: 0 10*3/uL (ref 0.0–0.5)
Eosinophils Relative: 1 %
HCT: 24.9 % — ABNORMAL LOW (ref 36.0–46.0)
Hemoglobin: 8.6 g/dL — ABNORMAL LOW (ref 12.0–15.0)
Immature Granulocytes: 0 %
Lymphocytes Relative: 19 %
Lymphs Abs: 0.7 10*3/uL (ref 0.7–4.0)
MCH: 34.7 pg — ABNORMAL HIGH (ref 26.0–34.0)
MCHC: 34.5 g/dL (ref 30.0–36.0)
MCV: 100.4 fL — ABNORMAL HIGH (ref 80.0–100.0)
Monocytes Absolute: 0.5 10*3/uL (ref 0.1–1.0)
Monocytes Relative: 13 %
Neutro Abs: 2.6 10*3/uL (ref 1.7–7.7)
Neutrophils Relative %: 67 %
Platelet Count: 217 10*3/uL (ref 150–400)
RBC: 2.48 MIL/uL — ABNORMAL LOW (ref 3.87–5.11)
RDW: 14.4 % (ref 11.5–15.5)
WBC Count: 3.8 10*3/uL — ABNORMAL LOW (ref 4.0–10.5)
nRBC: 0 % (ref 0.0–0.2)

## 2021-11-08 LAB — CMP (CANCER CENTER ONLY)
ALT: 8 U/L (ref 0–44)
AST: 16 U/L (ref 15–41)
Albumin: 3.7 g/dL (ref 3.5–5.0)
Alkaline Phosphatase: 169 U/L — ABNORMAL HIGH (ref 38–126)
Anion gap: 9 (ref 5–15)
BUN: 20 mg/dL (ref 8–23)
CO2: 21 mmol/L — ABNORMAL LOW (ref 22–32)
Calcium: 8.8 mg/dL — ABNORMAL LOW (ref 8.9–10.3)
Chloride: 105 mmol/L (ref 98–111)
Creatinine: 0.81 mg/dL (ref 0.44–1.00)
GFR, Estimated: >60 mL/min (ref >60)
Glucose, Bld: 134 mg/dL — ABNORMAL HIGH (ref 70–99)
Potassium: 3.9 mmol/L (ref 3.5–5.1)
Sodium: 135 mmol/L (ref 135–145)
Total Bilirubin: 0.3 mg/dL (ref 0.3–1.2)
Total Protein: 7.8 g/dL (ref 6.5–8.1)

## 2021-11-08 LAB — LACTATE DEHYDROGENASE: LDH: 130 U/L (ref 98–192)

## 2021-11-08 MED ORDER — DEXAMETHASONE 4 MG PO TABS
40.0000 mg | ORAL_TABLET | Freq: Once | ORAL | Status: AC
  Administered 2021-11-08: 40 mg via ORAL
  Filled 2021-11-08: qty 10

## 2021-11-08 MED ORDER — BORTEZOMIB CHEMO SQ INJECTION 3.5 MG (2.5MG/ML)
1.3000 mg/m2 | Freq: Once | INTRAMUSCULAR | Status: AC
  Administered 2021-11-08: 2 mg via SUBCUTANEOUS
  Filled 2021-11-08: qty 0.8

## 2021-11-08 NOTE — Patient Instructions (Signed)
[facility]  Discharge Instructions: ?Thank you for choosing [facility] to provide your oncology and hematology care.  ? ?If you have a lab appointment with the [facility], please go directly to the [facility] and  ? ?We strive to give you quality time with your provider. You may need to reschedule your appointment if you arrive late (15 or more minutes).  Arriving late affects you and other patients whose appointments are after yours.  Also, if you miss three or more appointments without notifying the office, you may be dismissed from the clinic at the provider?s discretion.    ?  ?For prescription refill requests, have your pharmacy contact our office and allow 72 hours for refills to be completed.   ? ?  ?  ?To help prevent nausea and vomiting after your treatment, we encourage you to take your nausea medication as directed. ? ?BELOW ARE SYMPTOMS THAT SHOULD BE REPORTED IMMEDIATELY: ?*FEVER GREATER THAN 100.4 F (38 ?C) OR HIGHER ?*CHILLS OR SWEATING ?*NAUSEA AND VOMITING THAT IS NOT CONTROLLED WITH YOUR NAUSEA MEDICATION ?*UNUSUAL SHORTNESS OF BREATH ?*UNUSUAL BRUISING OR BLEEDING ?*URINARY PROBLEMS (pain or burning when urinating, or frequent urination) ?*BOWEL PROBLEMS (unusual diarrhea, constipation, pain near the anus) ?TENDERNESS IN MOUTH AND THROAT WITH OR WITHOUT PRESENCE OF ULCERS (sore throat, sores in mouth, or a toothache) ?UNUSUAL RASH, SWELLING OR PAIN  ?UNUSUAL VAGINAL DISCHARGE OR ITCHING  ? ?Items with * indicate a potential emergency and should be followed up as soon as possible or go to the Emergency Department if any problems should occur. ? ?Please show the CHEMOTHERAPY ALERT CARD or IMMUNOTHERAPY ALERT CARD at check-in to the Emergency Department and triage nurse. ? ?Should you have questions after your visit or need to cancel or reschedule your appointment, please contact [facility]  Dept: [phone]  and  follow the prompts.  Office hours are 8:00 a.m. to 4:30 p.m. Monday - Friday. Please note that voicemails left after 4:00 p.m. may not be returned until the following business day.  We are closed weekends and major holidays. You have access to a nurse at all times for urgent questions. Please call the main number to the clinic Dept: [phone] and follow the prompts. ? ? ?For any non-urgent questions, you may also contact your provider using MyChart. We now offer e-Visits for anyone 73 and older to request care online for non-urgent symptoms. For details visit [email or web address]. ?  ?Also download the MyChart app! Go to the app store, search "MyChart", open the app, select [facility], and log in with your MyChart username and password. ? ?Due to Covid, a mask is required upon entering the hospital/clinic. If you do not have a mask, one will be given to you upon arrival. For doctor visits, patients may have 1 support person aged 15 or older with them. For treatment visits, patients cannot have anyone with them due to current Covid guidelines and our immunocompromised population.  ? ?

## 2021-11-08 NOTE — Progress Notes (Signed)
Treatment given per orders. Patient tolerated it well without problems. Vitals stable and discharged home from clinic ambulatory. Follow up as scheduled.  

## 2021-11-08 NOTE — Progress Notes (Signed)
?[facility] ?[phone]   [phone] ? ?PROGRESS NOTE ? ?Patient Care Team: ?Burnard Bunting, MD as PCP - General (Internal Medicine) ? ?Hematological/Oncological History ?# IgG Lambda Multiple Myeloma ?06/04/2021: WBC 5.5, Hgb 11.1, MCV 101.3, Plt 306 ?07/24/2021: M protein 2.3, IFE shows IgG Lambda monoclonal specificity. Cr 0.9 ?08/07/2021: Establish care with Dr. Lorenso Courier ?09/27/2021: Bmbx showed hypercellular bone marrow with plasma cell neoplasm, increased number of atypical plasma cells averaging 20% of all cells in the aspirate ?10/18/2021: Cycle 1 of Velcade/Dexamethasone ?11/08/2021: Cycle 2 of Velcade/Dexamethasone ? ?Interval History:  ?[name] Page 88 y.o. female with medical history significant for newly diagnosed IgG lambda multiple myeloma who presents for a follow up visit. The patient's last visit was on 10/24/2020. In the interim, patient completed Cycle 1 of Velcade/Dexamethasone. She is due for Cycle 2, Day 1. ? ?On exam today [name] Page is accompanied by her husband.  She reports worsening energy levels in the last 2 weeks.  She has increased her naps during the day and having some more difficulty to complete her daily ADLs.  She denies any nausea, vomiting or abdominal pain.  Her bowel habits are unchanged without any diarrhea or constipation.  Patient reports occasional episodes of back pain that is well controlled with naproxen at this time.  She reports that her right-sided rib pain has improved since last visit.  Patient has a stable appetite without any weight loss.  Patient denies any fevers, chills, night sweats, shortness of breath, chest pain or cough.She has no other complaints.  Rest of the 10 point ROS is below ? ?MEDICAL HISTORY:  ?Past Medical History:  ?Diagnosis Date  ? Alopecia 10/28/2016  ? Anginal pain ([place])   ? Arthritis   ? Cervical spondylosis   ? C3-4 AND C4-5  ? Chest pain 07/28/2008  ? H/O, normal stress nuclear EF 78%  ? Chronic  cough 07/12/2014  ? Collapsed lung 07/2010  ? being treated at [facility] - in left lung  ? Complication of anesthesia   ? Cough 07/30/2010  ? Qualifier: Diagnosis of  By: Chase Caller MD, Murali    ? Cystocele 05/23/2015  ? Cystocele, midline 05/02/2013  ? DEEP VEIN THROMBOSIS/PHLEBITIS 07/30/2010  ? Qualifier: History of  By: Harvest Dark CMA, Anderson [place]    ? Dislocation closed, shoulder 02/2013  ? DVT (deep venous thrombosis) ([place]) 2006  ? Encounter for therapeutic drug monitoring 10/03/2014  ? GERD (gastroesophageal reflux disease)   ? occ  ? Headache   ? hx  ? High cholesterol   ? History of recurrent UTIs 09/24/2011  ? HYPERLIPIDEMIA 07/30/2010  ? Qualifier: Diagnosis of  By: Harvest Dark CMA, Anderson Malta    ? Hypertension   ? HYPERTENSION 07/30/2010  ? Qualifier: Diagnosis of  By: Harvest Dark CMA, Anderson [place]    ? Hypothyroidism   ? Nasal itching 01/30/2014  ? Osteopenia 05/23/2015  ? Peripheral vascular disease ([place]) 2006  ? dvt's and 50 yrs ago  ? Pleuritic pain 12/17/2015  ? PONV (postoperative nausea and vomiting)   ? Primary localized osteoarthritis of right hip 08/26/2016  ? Primary osteoarthritis of right hip 08/26/2016  ? Pulmonary collapse 07/30/2010  ? Qualifier: Diagnosis of  By: Chase Caller MD, Murali    ? PULMONARY NODULE 07/30/2010  ? Qualifier: Diagnosis of  By: Chase Caller MD, Murali    ? Rash and nonspecific skin eruption 01/02/2015  ? 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 Sarcoidosis   ? Sarcoidosis of lung ([place])   ? Vaginal  atrophy 09/24/2011  ? Visual field defect nasal step 01/02/2015  ? ? ?SURGICAL HISTORY: ?Past Surgical History:  ?Procedure Laterality Date  ? BACK SURGERY  2022  ? EYE SURGERY    ? FOOT SURGERY Left 06/2005  ? bunion hammer toe  ? HEMORRHOID SURGERY  12/1972  ? INTRAOCULAR LENS INSERTION    ? right eye 10 /16 and left 06/16/2005  ? JOINT REPLACEMENT    ? KYPHOPLASTY N/A 06/27/2021  ? Procedure: LUMBAR 3 AND LUMBAR  5 KYPHOPLASTY;  Surgeon: Phylliss Bob, MD;  Location: [facility];  Service: Orthopedics;   Laterality: N/A;  ? KYPHOPLASTY N/A 09/04/2021  ? Procedure: THORACIC SEVEN, THORACIC EIGHT, THORACIC NINE, THORACIC TEN KYPHOPLASTY;  Surgeon: Phylliss Bob, MD;  Location: [facility];  Service: Orthopedics;  Laterality: N/A;  ? SKIN BIOPSY  01/2007  ? basal cell carcinoma removed from right lower leg   ? TOTAL HIP ARTHROPLASTY  06/2007  ? left  ? TOTAL HIP ARTHROPLASTY Right 08/26/2016  ? Procedure: TOTAL HIP ARTHROPLASTY ANTERIOR APPROACH;  Surgeon: Melrose Nakayama, MD;  Location: [facility];  Service: Orthopedics;  Laterality: Right;  ? TOTAL KNEE ARTHROPLASTY Right 10/2007  ? right  ? ? ?SOCIAL HISTORY: ?Social History  ? ?Socioeconomic History  ? Marital status: Married  ?  Spouse name: Not on file  ? Number of children: Not on file  ? Years of education: Not on file  ? Highest education level: Not on file  ?Occupational History  ? Occupation: retired  ?  Employer: RETIRED  ?Tobacco Use  ? Smoking status: Never  ? Smokeless tobacco: Never  ?Vaping Use  ? Vaping Use: Never used  ?Substance and Sexual Activity  ? Alcohol use: Not Currently  ?  Comment: BEER/glass of wine A DAY  ? Drug use: No  ? Sexual activity: Never  ?Other Topics Concern  ? Not on file  ?Social History Narrative  ? Not on file  ? ?Social Determinants of Health  ? ?Financial Resource Strain: Not on file  ?Food Insecurity: Not on file  ?Transportation Needs: Not on file  ?Physical Activity: Not on file  ?Stress: Not on file  ?Social Connections: Not on file  ?Intimate Partner Violence: Not on file  ? ? ?FAMILY HISTORY: ?Family History  ?Problem Relation Age of Onset  ? Diabetes Mother   ? Heart failure Mother   ? Hypertension Father   ? Diabetes Father   ? Heart disease Father   ? Heart attack Father   ? ? ?ALLERGIES:  is allergic to methotrexate derivatives. ? ?MEDICATIONS:  ?Current Outpatient Medications  ?Medication Sig Dispense Refill  ? acyclovir (ZOVIRAX) 400 MG tablet Take 1 tablet (400 mg total) by mouth 2 (two) times daily. 60 tablet 2  ?  amLODipine (NORVASC) 10 MG tablet Take 10 mg by mouth daily.    ? azelastine (OPTIVAR) 0.05 % ophthalmic solution Place 1 drop into both eyes 2 (two) times daily.     ? Calcium Citrate-Vitamin D (CALCIUM + D PO) Take 1 tablet by mouth daily.    ? Cholecalciferol (VITAMIN D-1000 MAX ST) 25 MCG (1000 UT) tablet Take 1,000 Units by mouth daily.    ? gemfibrozil (LOPID) 600 MG tablet Take 600 mg by mouth 2 (two) times daily before a meal.    ? levothyroxine (SYNTHROID, LEVOTHROID) 25 MCG tablet Take 25 mcg by mouth daily.     ? LORazepam (ATIVAN) 0.5 MG tablet Take 0.5 mg by mouth daily as needed for  anxiety or sleep.    ? losartan (COZAAR) 50 MG tablet Take 50 mg by mouth daily.    ? Multiple Vitamin (MULTIVITAMIN) capsule Take 1 capsule by mouth daily.    ? ondansetron (ZOFRAN) 8 MG tablet Take 1 tablet (8 mg total) by mouth every 8 (eight) hours as needed. 30 tablet 0  ? oxyCODONE (ROXICODONE) 5 MG immediate release tablet Take 1 tablet (5 mg total) by mouth every 6 (six) hours as needed for severe pain. 30 tablet 0  ? pantoprazole (PROTONIX) 20 MG tablet Take 20 mg by mouth daily.    ? polyethylene glycol powder (GLYCOLAX/MIRALAX) 17 GM/SCOOP powder Take 17 g by mouth at bedtime.    ? prednisoLONE acetate (PRED FORTE) 1 % ophthalmic suspension Place into the right eye.    ? predniSONE (DELTASONE) 1 MG tablet Take 2 tablets (2 mg total) by mouth daily with breakfast. (Patient taking differently: Take 1 mg by mouth daily with breakfast.) 90 tablet 3  ? prochlorperazine (COMPAZINE) 10 MG tablet Take 1 tablet (10 mg total) by mouth every 6 (six) hours as needed for nausea or vomiting. 30 tablet 0  ? tobramycin (TOBREX) 0.3 % ophthalmic solution Place into the right eye.    ? ?No current facility-administered medications for this visit.  ? ? ?REVIEW OF SYSTEMS:   ?Constitutional: ( - ) fevers, ( - )  chills , ( - ) night sweats ?Eyes: ( - ) blurriness of vision, ( - ) double vision, ( - ) watery eyes ?Ears, nose, mouth,  throat, and face: ( - ) mucositis, ( - ) sore throat ?Respiratory: ( - ) cough, ( - ) dyspnea, ( - ) wheezes ?Cardiovascular: ( - ) palpitation, ( - ) chest discomfort, ( - ) lower extremity swelling ?Gas

## 2021-11-11 ENCOUNTER — Encounter: Payer: Self-pay | Admitting: Hematology and Oncology

## 2021-11-11 ENCOUNTER — Inpatient Hospital Stay (HOSPITAL_COMMUNITY): Payer: Medicare PPO | Attending: Hematology | Admitting: Dietician

## 2021-11-11 ENCOUNTER — Telehealth (HOSPITAL_COMMUNITY): Payer: Self-pay | Admitting: Dietician

## 2021-11-11 NOTE — Telephone Encounter (Signed)
Nutrition Assessment ? ? ?Reason for Assessment: Weight loss ? ? ?ASSESSMENT: 88 year old female with multiple myeloma. She is receiving Velcade/dexamethasone. Patient is followed by Dr. Lorenso Courier. ? ?Past medical history includes cervical spondylosis, GERD, DVT, HLD, HTN, osteopenia, PVD, sarcoidosis.  ? ?Spoke with patient via telephone. Patient reports multiple episodes of diarrhea Saturday morning. Patient attributes this to eating too much ice cream Friday night. Diarrhea resolved by Saturday evening and she reports feeling fine today. Patient reports appetite is "mediocre at best."  Patient resides at [facility] and typically eats 3 meals daily. Recently has started snacking on nuts. She is eating less at meal times, endorsing early satiety. Patient recalls cereal or oatmeal with some fruit (blueberries and oranges) for breakfast, has soup or a sandwich (meat, lettuce, mayo) for lunch. Dinner meal varies (casserole, big salads). Patient endorses weight loss, ~10 lbs after back surgery in January.  ? ? ?Nutrition Focused Physical Exam: unable to complete at this time ? ? ?Medications: roxicodone, zofran, compazine, prednisone, calcium + D, ativan, protonix, cozaar, synthroid ? ? ?Labs: 3/31-  glucose 134 ? ? ?Anthropometrics:  ? ?Height: 5'1" ?Weight: 114 lb 4.8 (11/08/21) ?UBW: 124-126 lb (Dec 2022) ?BMI: 21.6 ? ? ?NUTRITION DIAGNOSIS: Unintentional weight loss related to chronic illness (newly diagnosed multiple myeloma, cervical spondylosis s/p kyphoplasty 09/04/21) as evidenced by reported decreased appetite, 9% decrease from usual weight in 3 months; significant ? ? ?INTERVENTION:  ?Discussed strategies for decreased appetite and ways to add calories and protein to foods, making the most of every bite - will mail handout with tips as well as shake recipes ?Encouraged small frequent meals and snacks - will mail handout ideas ?Patient does not care for Ensure/Boost - suggested patient try CIB  as alterate nutrition supplement, recommend drinking 1-2 times daily ? ? ? ?MONITORING, EVALUATION, GOAL: Patient will tolerate increased calories and protein to minimize further weight loss ? ? ?Next Visit: Friday, April 28 during infusion  ? ? ? ? ? ? ?

## 2021-11-13 DIAGNOSIS — H4720 Unspecified optic atrophy: Secondary | ICD-10-CM | POA: Diagnosis not present

## 2021-11-13 DIAGNOSIS — H04123 Dry eye syndrome of bilateral lacrimal glands: Secondary | ICD-10-CM | POA: Diagnosis not present

## 2021-11-13 DIAGNOSIS — H52203 Unspecified astigmatism, bilateral: Secondary | ICD-10-CM | POA: Diagnosis not present

## 2021-11-13 DIAGNOSIS — H1820 Unspecified corneal edema: Secondary | ICD-10-CM | POA: Diagnosis not present

## 2021-11-15 ENCOUNTER — Telehealth: Payer: Self-pay | Admitting: Nurse Practitioner

## 2021-11-15 ENCOUNTER — Other Ambulatory Visit: Payer: Self-pay | Admitting: Physician Assistant

## 2021-11-15 ENCOUNTER — Inpatient Hospital Stay: Payer: Medicare PPO | Attending: Hematology and Oncology

## 2021-11-15 ENCOUNTER — Other Ambulatory Visit: Payer: Self-pay

## 2021-11-15 ENCOUNTER — Inpatient Hospital Stay: Payer: Medicare PPO

## 2021-11-15 VITALS — BP 169/66 | HR 63 | Resp 16 | Wt 114.8 lb

## 2021-11-15 DIAGNOSIS — C9000 Multiple myeloma not having achieved remission: Secondary | ICD-10-CM | POA: Insufficient documentation

## 2021-11-15 DIAGNOSIS — Z5112 Encounter for antineoplastic immunotherapy: Secondary | ICD-10-CM | POA: Insufficient documentation

## 2021-11-15 DIAGNOSIS — Z800 Family history of malignant neoplasm of digestive organs: Secondary | ICD-10-CM | POA: Insufficient documentation

## 2021-11-15 DIAGNOSIS — D649 Anemia, unspecified: Secondary | ICD-10-CM

## 2021-11-15 LAB — CMP (CANCER CENTER ONLY)
ALT: 9 U/L (ref 0–44)
AST: 17 U/L (ref 15–41)
Albumin: 3.7 g/dL (ref 3.5–5.0)
Alkaline Phosphatase: 150 U/L — ABNORMAL HIGH (ref 38–126)
Anion gap: 9 (ref 5–15)
BUN: 21 mg/dL (ref 8–23)
CO2: 23 mmol/L (ref 22–32)
Calcium: 8.8 mg/dL — ABNORMAL LOW (ref 8.9–10.3)
Chloride: 100 mmol/L (ref 98–111)
Creatinine: 0.96 mg/dL (ref 0.44–1.00)
GFR, Estimated: 57 mL/min — ABNORMAL LOW (ref >60)
Glucose, Bld: 119 mg/dL — ABNORMAL HIGH (ref 70–99)
Potassium: 4.2 mmol/L (ref 3.5–5.1)
Sodium: 132 mmol/L — ABNORMAL LOW (ref 135–145)
Total Bilirubin: 0.3 mg/dL (ref 0.3–1.2)
Total Protein: 7.7 g/dL (ref 6.5–8.1)

## 2021-11-15 LAB — CBC WITH DIFFERENTIAL (CANCER CENTER ONLY)
Abs Immature Granulocytes: 0.01 10*3/uL (ref 0.00–0.07)
Basophils Absolute: 0 10*3/uL (ref 0.0–0.1)
Basophils Relative: 0 %
Eosinophils Absolute: 0 10*3/uL (ref 0.0–0.5)
Eosinophils Relative: 1 %
HCT: 25.1 % — ABNORMAL LOW (ref 36.0–46.0)
Hemoglobin: 8.6 g/dL — ABNORMAL LOW (ref 12.0–15.0)
Immature Granulocytes: 0 %
Lymphocytes Relative: 17 %
Lymphs Abs: 0.7 10*3/uL (ref 0.7–4.0)
MCH: 34.8 pg — ABNORMAL HIGH (ref 26.0–34.0)
MCHC: 34.3 g/dL (ref 30.0–36.0)
MCV: 101.6 fL — ABNORMAL HIGH (ref 80.0–100.0)
Monocytes Absolute: 0.4 10*3/uL (ref 0.1–1.0)
Monocytes Relative: 11 %
Neutro Abs: 2.6 10*3/uL (ref 1.7–7.7)
Neutrophils Relative %: 71 %
Platelet Count: 194 10*3/uL (ref 150–400)
RBC: 2.47 MIL/uL — ABNORMAL LOW (ref 3.87–5.11)
RDW: 14.3 % (ref 11.5–15.5)
WBC Count: 3.8 10*3/uL — ABNORMAL LOW (ref 4.0–10.5)
nRBC: 0.5 % — ABNORMAL HIGH (ref 0.0–0.2)

## 2021-11-15 LAB — IRON AND IRON BINDING CAPACITY (CC-WL,HP ONLY)
Iron: 116 ug/dL (ref 28–170)
Saturation Ratios: 21 % (ref 10.4–31.8)
TIBC: 545 ug/dL — ABNORMAL HIGH (ref 250–450)
UIBC: 429 ug/dL (ref 148–442)

## 2021-11-15 LAB — FERRITIN: Ferritin: 155 ng/mL (ref 11–307)

## 2021-11-15 LAB — FOLATE: Folate: 52.3 ng/mL (ref >5.9)

## 2021-11-15 LAB — LACTATE DEHYDROGENASE: LDH: 142 U/L (ref 98–192)

## 2021-11-15 LAB — VITAMIN B12: Vitamin B-12: 160 pg/mL — ABNORMAL LOW (ref 180–914)

## 2021-11-15 MED ORDER — DEXAMETHASONE 4 MG PO TABS
40.0000 mg | ORAL_TABLET | Freq: Once | ORAL | Status: AC
  Administered 2021-11-15: 40 mg via ORAL

## 2021-11-15 MED ORDER — BORTEZOMIB CHEMO SQ INJECTION 3.5 MG (2.5MG/ML)
1.3000 mg/m2 | Freq: Once | INTRAMUSCULAR | Status: AC
  Administered 2021-11-15: 2 mg via SUBCUTANEOUS
  Filled 2021-11-15: qty 0.8

## 2021-11-15 NOTE — Patient Instructions (Signed)
[facility]  Discharge Instructions: Thank you for choosing [facility] to provide your oncology and hematology care.   If you have a lab appointment with the Cancer Center, please go directly to the Cancer Center and check in at the registration area.   Wear comfortable clothing and clothing appropriate for easy access to any Portacath or PICC line.   We strive to give you quality time with your provider. You may need to reschedule your appointment if you arrive late (15 or more minutes).  Arriving late affects you and other patients whose appointments are after yours.  Also, if you miss three or more appointments without notifying the office, you may be dismissed from the clinic at the provider's discretion.      For prescription refill requests, have your pharmacy contact our office and allow 72 hours for refills to be completed.    Today you received the following chemotherapy and/or immunotherapy agents Velcade       To help prevent nausea and vomiting after your treatment, we encourage you to take your nausea medication as directed.  BELOW ARE SYMPTOMS THAT SHOULD BE REPORTED IMMEDIATELY: *FEVER GREATER THAN 100.4 F (38 C) OR HIGHER *CHILLS OR SWEATING *NAUSEA AND VOMITING THAT IS NOT CONTROLLED WITH YOUR NAUSEA MEDICATION *UNUSUAL SHORTNESS OF BREATH *UNUSUAL BRUISING OR BLEEDING *URINARY PROBLEMS (pain or burning when urinating, or frequent urination) *BOWEL PROBLEMS (unusual diarrhea, constipation, pain near the anus) TENDERNESS IN MOUTH AND THROAT WITH OR WITHOUT PRESENCE OF ULCERS (sore throat, sores in mouth, or a toothache) UNUSUAL RASH, SWELLING OR PAIN  UNUSUAL VAGINAL DISCHARGE OR ITCHING   Items with * indicate a potential emergency and should be followed up as soon as possible or go to the Emergency Department if any problems should occur.  Please show the CHEMOTHERAPY ALERT CARD or IMMUNOTHERAPY ALERT CARD at check-in to  the Emergency Department and triage nurse.  Should you have questions after your visit or need to cancel or reschedule your appointment, please contact [facility]  Dept: [phone]  and follow the prompts.  Office hours are 8:00 a.m. to 4:30 p.m. Monday - Friday. Please note that voicemails left after 4:00 p.m. may not be returned until the following business day.  We are closed weekends and major holidays. You have access to a nurse at all times for urgent questions. Please call the main number to the clinic Dept: [phone] and follow the prompts.   For any non-urgent questions, you may also contact your provider using MyChart. We now offer e-Visits for anyone 18 and older to request care online for non-urgent symptoms. For details visit [email or web address].   Also download the MyChart app! Go to the app store, search "MyChart", open the app, select [facility], and log in with your MyChart username and password.  Due to Covid, a mask is required upon entering the hospital/clinic. If you do not have a mask, one will be given to you upon arrival. For doctor visits, patients may have 1 support person aged 18 or older with them. For treatment visits, patients cannot have anyone with them due to current Covid guidelines and our immunocompromised population.   

## 2021-11-18 LAB — METHYLMALONIC ACID, SERUM: Methylmalonic Acid, Quantitative: 259 nmol/L (ref 0–378)

## 2021-11-19 ENCOUNTER — Telehealth (INDEPENDENT_AMBULATORY_CARE_PROVIDER_SITE_OTHER): Payer: Medicare PPO | Admitting: Nurse Practitioner

## 2021-11-19 ENCOUNTER — Encounter: Payer: Self-pay | Admitting: Nurse Practitioner

## 2021-11-19 DIAGNOSIS — C9000 Multiple myeloma not having achieved remission: Secondary | ICD-10-CM | POA: Diagnosis not present

## 2021-11-19 DIAGNOSIS — J9819 Other pulmonary collapse: Secondary | ICD-10-CM | POA: Diagnosis not present

## 2021-11-19 DIAGNOSIS — D869 Sarcoidosis, unspecified: Secondary | ICD-10-CM

## 2021-11-19 MED ORDER — PREDNISONE 1 MG PO TABS: 0.5000 mg | ORAL_TABLET | Freq: Every day | ORAL | 0 refills | Status: AC

## 2021-11-19 NOTE — Progress Notes (Deleted)
? ?'@Patient'$  ID: [name], female    DOB: [birth year], 88 y.o.   MRN: [id number] ? ?Chief Complaint  ?Patient presents with  ? Follow-up  ?  Follow up per MR. Saw MR 3/14. Pt states she is doing fine today. Pt states she has no questions noted today and no concerns   ? ? ?Referring provider: ?Burnard Bunting, MD ? ?HPI: ? ? ?TEST/EVENTS:  ? ?Allergies  ?Allergen Reactions  ? Methotrexate Derivatives   ?  Mild alopecia   ? ? ?Immunization History  ?Administered Date(s) Administered  ? DTaP 12/10/1995  ? Hepatitis A 12/10/1999  ? Hepatitis A, Ped/Adol-2 Dose 12/10/1999  ? Influenza Split 04/15/2010, 04/21/2011, 04/29/2012, 04/11/2013, 04/28/2013, 04/11/2014  ? Influenza Whole 04/15/2010  ? Influenza, High Dose Seasonal PF 04/17/2018, 04/21/2019, 05/14/2020  ? Influenza, Quadrivalent, Recombinant, Inj, Pf 04/17/2018, 04/30/2019, 05/18/2021  ? Influenza,inj,Quad PF,6+ Mos 04/28/2015, 05/07/2015, 04/11/2016, 04/26/2016, 05/09/2017  ? Influenza-Unspecified 05/07/2015, 04/11/2016, 05/09/2017  ? Moderna SARS-COV2 Booster Vaccination 06/13/2020  ? Moderna Sars-Covid-2 Vaccination 08/23/2019, 09/20/2019, 06/13/2020  ? Pneumococcal Conjugate-13 09/05/2013, 03/13/2014, 04/28/2017  ? Pneumococcal Polysaccharide-23 05/13/2004, 05/23/2004, 11/05/2010  ? Pneumococcal-Unspecified 08/11/2010, 03/11/2014  ? Td 02/08/2002, 02/08/2002, 03/03/2002, 03/03/2012, 09/06/2012  ? Tdap 08/11/2012, 09/06/2012  ? Zoster Recombinat (Shingrix) 12/30/2019, 02/29/2020  ? Zoster, Live 07/08/2006, 06/12/2007, 07/09/2007  ? Zoster, Unspecified 12/30/2019, 02/29/2020  ? ? ?Past Medical History:  ?Diagnosis Date  ? Alopecia 10/28/2016  ? Anginal pain ([place])   ? Arthritis   ? Cervical spondylosis   ? C3-4 AND C4-5  ? Chest pain 07/28/2008  ? H/O, normal stress nuclear EF 78%  ? Chronic cough 07/12/2014  ? Collapsed lung 07/2010  ? being treated at [facility] - in left lung  ? Complication of anesthesia   ? Cough 07/30/2010  ? Qualifier: Diagnosis of  By:  Chase Caller MD, Murali    ? Cystocele 05/23/2015  ? Cystocele, midline 05/02/2013  ? DEEP VEIN THROMBOSIS/PHLEBITIS 07/30/2010  ? Qualifier: History of  By: Harvest Dark CMA, Anderson Malta    ? Dislocation closed, shoulder 02/2013  ? DVT (deep venous thrombosis) ([place]) 2006  ? Encounter for therapeutic drug monitoring 10/03/2014  ? GERD (gastroesophageal reflux disease)   ? occ  ? Headache   ? hx  ? High cholesterol   ? History of recurrent UTIs 09/24/2011  ? HYPERLIPIDEMIA 07/30/2010  ? Qualifier: Diagnosis of  By: Harvest Dark CMA, Anderson Malta    ? Hypertension   ? HYPERTENSION 07/30/2010  ? Qualifier: Diagnosis of  By: Harvest Dark CMA, Anderson Malta    ? Hypothyroidism   ? Nasal itching 01/30/2014  ? Osteopenia 05/23/2015  ? Peripheral vascular disease ([place]) 2006  ? dvt's and 50 yrs ago  ? Pleuritic pain 12/17/2015  ? PONV (postoperative nausea and vomiting)   ? Primary localized osteoarthritis of right hip 08/26/2016  ? Primary osteoarthritis of right hip 08/26/2016  ? Pulmonary collapse 07/30/2010  ? Qualifier: Diagnosis of  By: Chase Caller MD, Murali    ? PULMONARY NODULE 07/30/2010  ? Qualifier: Diagnosis of  By: Chase Caller MD, Murali    ? Rash and nonspecific skin eruption 01/02/2015  ? 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 Sarcoidosis   ? Sarcoidosis of lung ([place])   ? Vaginal atrophy 09/24/2011  ? Visual field defect nasal step 01/02/2015  ? ? ?Tobacco History: ?Social History  ? ?Tobacco Use  ?Smoking Status Never  ?Smokeless Tobacco Never  ? ?Counseling given: Not Answered ? ? ?Outpatient Medications Prior to Visit  ?Medication Sig Dispense Refill  ?  acyclovir (ZOVIRAX) 400 MG tablet Take 1 tablet (400 mg total) by mouth 2 (two) times daily. 60 tablet 2  ? amLODipine (NORVASC) 10 MG tablet Take 10 mg by mouth daily.    ? azelastine (OPTIVAR) 0.05 % ophthalmic solution Place 1 drop into both eyes 2 (two) times daily.     ? Calcium Citrate-Vitamin D (CALCIUM + D PO) Take 1 tablet by mouth daily.    ? Cholecalciferol (VITAMIN D-1000  MAX ST) 25 MCG (1000 UT) tablet Take 1,000 Units by mouth daily.    ? gemfibrozil (LOPID) 600 MG tablet Take 600 mg by mouth 2 (two) times daily before a meal.    ? levothyroxine (SYNTHROID, LEVOTHROID) 25 MCG tablet Take 25 mcg by mouth daily.     ? LORazepam (ATIVAN) 0.5 MG tablet Take 0.5 mg by mouth daily as needed for anxiety or sleep.    ? LORazepam (ATIVAN) 1 MG tablet Take 0.5 mg by mouth every 8 (eight) hours as needed.    ? losartan (COZAAR) 50 MG tablet Take 50 mg by mouth daily.    ? Multiple Vitamin (MULTIVITAMIN) capsule Take 1 capsule by mouth daily.    ? ondansetron (ZOFRAN) 8 MG tablet Take 1 tablet (8 mg total) by mouth every 8 (eight) hours as needed. 30 tablet 0  ? oxyCODONE (ROXICODONE) 5 MG immediate release tablet Take 1 tablet (5 mg total) by mouth every 6 (six) hours as needed for severe pain. 30 tablet 0  ? pantoprazole (PROTONIX) 20 MG tablet Take 20 mg by mouth daily.    ? polyethylene glycol powder (GLYCOLAX/MIRALAX) 17 GM/SCOOP powder Take 17 g by mouth at bedtime.    ? prednisoLONE acetate (PRED FORTE) 1 % ophthalmic suspension Place into the right eye.    ? predniSONE (DELTASONE) 1 MG tablet Take 2 tablets (2 mg total) by mouth daily with breakfast. (Patient taking differently: Take 1 mg by mouth daily with breakfast.) 90 tablet 3  ? prochlorperazine (COMPAZINE) 10 MG tablet Take 1 tablet (10 mg total) by mouth every 6 (six) hours as needed for nausea or vomiting. 30 tablet 0  ? tobramycin (TOBREX) 0.3 % ophthalmic solution Place into the right eye.    ? ?No facility-administered medications prior to visit.  ? ? ? ?Review of Systems:  ? ?Constitutional: No weight loss or gain, night sweats, fevers, chills, fatigue, or lassitude. ?HEENT: No headaches, difficulty swallowing, tooth/dental problems, or sore throat. No sneezing, itching, ear ache, nasal congestion, or post nasal drip ?CV:  No chest pain, orthopnea, PND, swelling in lower extremities, anasarca, dizziness, palpitations,  syncope ?Resp: No shortness of breath with exertion or at rest. No excess mucus or change in color of mucus. No productive or non-productive. No hemoptysis. No wheezing.  No chest wall deformity ?GI:  No heartburn, indigestion, abdominal pain, nausea, vomiting, diarrhea, change in bowel habits, loss of appetite, bloody stools.  ?GU: No dysuria, change in color of urine, urgency or frequency.  No flank pain, no hematuria  ?Skin: No rash, lesions, ulcerations ?MSK:  No joint pain or swelling.  No decreased range of motion.  No back pain. ?Neuro: No dizziness or lightheadedness.  ?Psych: No depression or anxiety. Mood stable.  ? ? ? ?Physical Exam: ? ?There were no vitals taken for this visit. ? ?GEN: Pleasant, interactive, well-nourished/chronically-ill appearing/acutely-ill appearing/poorly-nourished/morbidly obese; in no acute distress.****** ?HEENT:  Normocephalic and atraumatic. EACs patent bilaterally. TM pearly gray with present light reflex bilaterally. PERRLA. Sclera white. Nasal turbinates  pink, moist and patent bilaterally. No rhinorrhea present. Oropharynx pink and moist, without exudate or edema. No lesions, ulcerations, or postnasal drip.  ?NECK:  Supple w/ fair ROM. No JVD present. Normal carotid impulses w/o bruits. Thyroid symmetrical with no goiter or nodules palpated. No lymphadenopathy.   ?CV: RRR, no m/r/g, no peripheral edema. Pulses intact, +2 bilaterally. No cyanosis, pallor or clubbing. ?PULMONARY:  Unlabored, regular breathing. Clear bilaterally A&P w/o wheezes/rales/rhonchi. No accessory muscle use. No dullness to percussion. ?GI: BS present and normoactive. Soft, non-tender to palpation. No organomegaly or masses detected. No CVA tenderness. ?MSK: No erythema, warmth or tenderness. Cap refil <2 sec all extrem. No deformities or joint swelling noted.  ?Neuro: A/Ox3. No focal deficits noted.   ?Skin: Warm, no lesions or rashe ?Psych: Normal affect and behavior. Judgement and thought content  appropriate.  ? ? ? ?Lab Results: ? ?CBC ?   ?Component Value Date/Time  ? WBC 3.8 (L) 11/15/2021 1319  ? WBC 3.5 (L) 09/27/2021 0746  ? RBC 2.47 (L) 11/15/2021 1319  ? HGB 8.6 (L) 11/15/2021 1319  ? HCT 25.1

## 2021-11-19 NOTE — Telephone Encounter (Signed)
Seems like encounter was open in error so closing encounter.  

## 2021-11-19 NOTE — Progress Notes (Signed)
? ?Patient ID: [name] Page, female     DOB: 12-Jun-1931, 88 y.o.      MRN: [id number] ? ?Chief Complaint  ?Patient presents with  ? Follow-up  ?  Follow up per MR. Saw MR 3/14. Pt states she is doing fine today. Pt states she has no questions noted today and no concerns   ? ? ?Virtual Visit via Video Note ? ?I connected with [name] Page on 11/19/21 at 10:00 AM EDT by a video enabled telemedicine application and verified that I am speaking with the correct person using two identifiers. ? ?Location: ?Patient: Home ?Provider: Office ?  ?I discussed the limitations of evaluation and management by telemedicine and the availability of in person appointments. The patient expressed understanding and agreed to proceed. ? 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  She is a patient Dr. Golden Pop and last seen in office on 10/22/2021.  She is currently undergoing treatment for multiple myeloma and followed by Dr. Lorenso Courier.  Past medical history significant for hypertension, history of DVT, migraines, GERD, hypothyroidism, osteoarthritis, HLD, anxiety. ? ?10/22/2021: OV with Dr. Chase Caller.  Denies having cough for the past year.  On prednisone 10 mg daily.  Reported that she wanted to get off prednisone.  Taper down to 1 mg daily.  Continue to have several other health problems including bony pain and problems obstructing her right shoulder.  HRCT ordered for eval for stability of sarcoid. Concerned for bony destruction of ribs.  ? ?11/19/2021: Today- follow-up ?Patient presents today for follow-up after HRCT and tapering prednisone from 2 mg/day to 1 mg/day.  She reports overall feeling relatively the same.  Continues to have pain in her shoulder and arm.  She does feel like she rarely gets the urge to cough and has some difficulty producing 1.  She thinks this is more so related to her multiple myeloma then decreasing her prednisone.   Her breathing is stable without any shortness of breath.  She denies any skin lesions or vision changes.  No recent fevers, night sweats or anorexia.  Overall feeling stable ? ?Allergies  ?Allergen Reactions  ? Methotrexate Derivatives   ?  Mild alopecia   ? ?Immunization History  ?Administered Date(s) Administered  ? DTaP 12/10/1995  ? Hepatitis A 12/10/1999  ? Hepatitis A, Ped/Adol-2 Dose 12/10/1999  ? Influenza Split 04/15/2010, 04/21/2011, 04/29/2012, 04/11/2013, 04/28/2013, 04/11/2014  ? Influenza Whole 04/15/2010  ? Influenza, High Dose Seasonal PF 04/17/2018, 04/21/2019, 05/14/2020  ? Influenza, Quadrivalent, Recombinant, Inj, Pf 04/17/2018, 04/30/2019, 05/18/2021  ? Influenza,inj,Quad PF,6+ Mos 04/28/2015, 05/07/2015, 04/11/2016, 04/26/2016, 05/09/2017  ? Influenza-Unspecified 05/07/2015, 04/11/2016, 05/09/2017  ? Moderna SARS-COV2 Booster Vaccination 06/13/2020  ? Moderna Sars-Covid-2 Vaccination 08/23/2019, 09/20/2019, 06/13/2020  ? Pneumococcal Conjugate-13 09/05/2013, 03/13/2014, 04/28/2017  ? Pneumococcal Polysaccharide-23 05/13/2004, 05/23/2004, 11/05/2010  ? Pneumococcal-Unspecified 08/11/2010, 03/11/2014  ? Td 02/08/2002, 02/08/2002, 03/03/2002, 03/03/2012, 09/06/2012  ? Tdap 08/11/2012, 09/06/2012  ? Zoster Recombinat (Shingrix) 12/30/2019, 02/29/2020  ? Zoster, Live 07/08/2006, 06/12/2007, 07/09/2007  ? Zoster, Unspecified 12/30/2019, 02/29/2020  ? ?Past Medical History:  ?Diagnosis Date  ? Alopecia 10/28/2016  ? Anginal pain ([place])   ? Arthritis   ? Cervical spondylosis   ? C3-4 AND C4-5  ? Chest pain 07/28/2008  ? H/O, normal stress nuclear EF 78%  ? Chronic cough 07/12/2014  ? Collapsed lung 07/2010  ? being treated at [facility] - in left lung  ? Complication of anesthesia   ?  Cough 07/30/2010  ? Qualifier: Diagnosis of  By: Chase Caller MD, Murali    ? Cystocele 05/23/2015  ? Cystocele, midline 05/02/2013  ? DEEP VEIN THROMBOSIS/PHLEBITIS 07/30/2010  ? Qualifier: History of  By: Harvest Dark CMA, Anderson Malta     ? Dislocation closed, shoulder 02/2013  ? DVT (deep venous thrombosis) ([place]) 2006  ? Encounter for therapeutic drug monitoring 10/03/2014  ? GERD (gastroesophageal reflux disease)   ? occ  ? Headache   ? hx  ? High cholesterol   ? History of recurrent UTIs 09/24/2011  ? HYPERLIPIDEMIA 07/30/2010  ? Qualifier: Diagnosis of  By: Harvest Dark CMA, Anderson Malta    ? Hypertension   ? HYPERTENSION 07/30/2010  ? Qualifier: Diagnosis of  By: Harvest Dark CMA, Anderson Malta    ? Hypothyroidism   ? Nasal itching 01/30/2014  ? Osteopenia 05/23/2015  ? Peripheral vascular disease ([place]) 2006  ? dvt's and 50 yrs ago  ? Pleuritic pain 12/17/2015  ? PONV (postoperative nausea and vomiting)   ? Primary localized osteoarthritis of right hip 08/26/2016  ? Primary osteoarthritis of right hip 08/26/2016  ? Pulmonary collapse 07/30/2010  ? Qualifier: Diagnosis of  By: Chase Caller MD, Murali    ? PULMONARY NODULE 07/30/2010  ? Qualifier: Diagnosis of  By: Chase Caller MD, Murali    ? Rash and nonspecific skin eruption 01/02/2015  ? 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 Sarcoidosis   ? Sarcoidosis of lung ([place])   ? Vaginal atrophy 09/24/2011  ? Visual field defect nasal step 01/02/2015  ? ? ?Tobacco History: ?Social History  ? ?Tobacco Use  ?Smoking Status Never  ?Smokeless Tobacco Never  ? ?Counseling given: Not Answered ? ? ?Outpatient Medications Prior to Visit  ?Medication Sig Dispense Refill  ? acyclovir (ZOVIRAX) 400 MG tablet Take 1 tablet (400 mg total) by mouth 2 (two) times daily. 60 tablet 2  ? amLODipine (NORVASC) 10 MG tablet Take 10 mg by mouth daily.    ? azelastine (OPTIVAR) 0.05 % ophthalmic solution Place 1 drop into both eyes 2 (two) times daily.     ? Calcium Citrate-Vitamin D (CALCIUM + D PO) Take 1 tablet by mouth daily.    ? Cholecalciferol (VITAMIN D-1000 MAX ST) 25 MCG (1000 UT) tablet Take 1,000 Units by mouth daily.    ? gemfibrozil (LOPID) 600 MG tablet Take 600 mg by mouth 2 (two) times daily before a meal.    ? levothyroxine  (SYNTHROID, LEVOTHROID) 25 MCG tablet Take 25 mcg by mouth daily.     ? LORazepam (ATIVAN) 0.5 MG tablet Take 0.5 mg by mouth daily as needed for anxiety or sleep.    ? LORazepam (ATIVAN) 1 MG tablet Take 0.5 mg by mouth every 8 (eight) hours as needed.    ? losartan (COZAAR) 50 MG tablet Take 50 mg by mouth daily.    ? Multiple Vitamin (MULTIVITAMIN) capsule Take 1 capsule by mouth daily.    ? ondansetron (ZOFRAN) 8 MG tablet Take 1 tablet (8 mg total) by mouth every 8 (eight) hours as needed. 30 tablet 0  ? oxyCODONE (ROXICODONE) 5 MG immediate release tablet Take 1 tablet (5 mg total) by mouth every 6 (six) hours as needed for severe pain. 30 tablet 0  ? pantoprazole (PROTONIX) 20 MG tablet Take 20 mg by mouth daily.    ? polyethylene glycol powder (GLYCOLAX/MIRALAX) 17 GM/SCOOP powder Take 17 g by mouth at bedtime.    ? prednisoLONE acetate (PRED FORTE) 1 % ophthalmic suspension Place into the right  eye.    ? prochlorperazine (COMPAZINE) 10 MG tablet Take 1 tablet (10 mg total) by mouth every 6 (six) hours as needed for nausea or vomiting. 30 tablet 0  ? tobramycin (TOBREX) 0.3 % ophthalmic solution Place into the right eye.    ? predniSONE (DELTASONE) 1 MG tablet Take 2 tablets (2 mg total) by mouth daily with breakfast. (Patient taking differently: Take 1 mg by mouth daily with breakfast.) 90 tablet 3  ? ?No facility-administered medications prior to visit.  ? ?  ?Review of Systems:  ? ?Constitutional: No weight loss or gain, night sweats, fevers, chills. +fatigue (chronic, stable) ?HEENT: No headaches, difficulty swallowing, tooth/dental problems, or sore throat. No sneezing, itching, ear ache, nasal congestion, or post nasal drip ?CV:  No chest pain, orthopnea, PND, swelling in lower extremities, anasarca, dizziness, palpitations, syncope ?Resp: +very rare dry cough. No shortness of breath with exertion or at rest. No excess mucus or change in color of mucus. No hemoptysis. No wheezing.  No chest wall  deformity ?GI:  No heartburn, indigestion, abdominal pain, nausea, vomiting, diarrhea, change in bowel habits, loss of appetite, bloody stools.  ?GU: No dysuria, change in color of urine, urgency or frequency

## 2021-11-19 NOTE — Assessment & Plan Note (Signed)
HRCT without any evidence of aggressive osseous lesions.  Follow-up with Dr. Lorenso Courier as scheduled. ?

## 2021-11-19 NOTE — Assessment & Plan Note (Signed)
Chronic right middle lobe atelectasis, unchanged on recent HRCT.  Respiratory status overall stable. ?

## 2021-11-19 NOTE — Assessment & Plan Note (Signed)
Stable on recent HRCT; overall improved without any progression of disease.  Rare, dry cough over the past few weeks.  Reports that it only occurs a few times a week.  Patient felt like it was more so related to her multiply [facility] then decreasing prednisone.  Advised that we could taper to 0.5 mg daily for a month and then stop.  Monitor her symptoms with continued tapering.  I discussed with her notifying us if cough worsened with decreased dose of prednisone.  Patient verbalized understanding. ?

## 2021-11-22 ENCOUNTER — Other Ambulatory Visit: Payer: Self-pay

## 2021-11-22 ENCOUNTER — Other Ambulatory Visit: Payer: Self-pay | Admitting: Hematology and Oncology

## 2021-11-22 ENCOUNTER — Inpatient Hospital Stay: Payer: Medicare PPO

## 2021-11-22 ENCOUNTER — Inpatient Hospital Stay: Payer: Medicare PPO | Admitting: Hematology and Oncology

## 2021-11-22 VITALS — BP 157/62 | HR 72 | Temp 96.7°F | Resp 17 | Ht 61.0 in | Wt 117.3 lb

## 2021-11-22 DIAGNOSIS — D649 Anemia, unspecified: Secondary | ICD-10-CM

## 2021-11-22 DIAGNOSIS — E538 Deficiency of other specified B group vitamins: Secondary | ICD-10-CM | POA: Diagnosis not present

## 2021-11-22 DIAGNOSIS — Z800 Family history of malignant neoplasm of digestive organs: Secondary | ICD-10-CM | POA: Diagnosis not present

## 2021-11-22 DIAGNOSIS — C9000 Multiple myeloma not having achieved remission: Secondary | ICD-10-CM

## 2021-11-22 DIAGNOSIS — Z5112 Encounter for antineoplastic immunotherapy: Secondary | ICD-10-CM | POA: Diagnosis not present

## 2021-11-22 LAB — CMP (CANCER CENTER ONLY)
ALT: 8 U/L (ref 0–44)
AST: 18 U/L (ref 15–41)
Albumin: 3.6 g/dL (ref 3.5–5.0)
Alkaline Phosphatase: 127 U/L — ABNORMAL HIGH (ref 38–126)
Anion gap: 6 (ref 5–15)
BUN: 17 mg/dL (ref 8–23)
CO2: 22 mmol/L (ref 22–32)
Calcium: 8.6 mg/dL — ABNORMAL LOW (ref 8.9–10.3)
Chloride: 104 mmol/L (ref 98–111)
Creatinine: 0.84 mg/dL (ref 0.44–1.00)
GFR, Estimated: >60 mL/min (ref >60)
Glucose, Bld: 145 mg/dL — ABNORMAL HIGH (ref 70–99)
Potassium: 4.2 mmol/L (ref 3.5–5.1)
Sodium: 132 mmol/L — ABNORMAL LOW (ref 135–145)
Total Bilirubin: 0.3 mg/dL (ref 0.3–1.2)
Total Protein: 7 g/dL (ref 6.5–8.1)

## 2021-11-22 LAB — CBC WITH DIFFERENTIAL (CANCER CENTER ONLY)
Abs Immature Granulocytes: 0.02 10*3/uL (ref 0.00–0.07)
Basophils Absolute: 0 10*3/uL (ref 0.0–0.1)
Basophils Relative: 0 %
Eosinophils Absolute: 0.1 10*3/uL (ref 0.0–0.5)
Eosinophils Relative: 1 %
HCT: 23.6 % — ABNORMAL LOW (ref 36.0–46.0)
Hemoglobin: 8.1 g/dL — ABNORMAL LOW (ref 12.0–15.0)
Immature Granulocytes: 1 %
Lymphocytes Relative: 19 %
Lymphs Abs: 0.7 10*3/uL (ref 0.7–4.0)
MCH: 35.2 pg — ABNORMAL HIGH (ref 26.0–34.0)
MCHC: 34.3 g/dL (ref 30.0–36.0)
MCV: 102.6 fL — ABNORMAL HIGH (ref 80.0–100.0)
Monocytes Absolute: 0.4 10*3/uL (ref 0.1–1.0)
Monocytes Relative: 12 %
Neutro Abs: 2.5 10*3/uL (ref 1.7–7.7)
Neutrophils Relative %: 67 %
Platelet Count: 196 10*3/uL (ref 150–400)
RBC: 2.3 MIL/uL — ABNORMAL LOW (ref 3.87–5.11)
RDW: 14.3 % (ref 11.5–15.5)
WBC Count: 3.7 10*3/uL — ABNORMAL LOW (ref 4.0–10.5)
nRBC: 0 % (ref 0.0–0.2)

## 2021-11-22 LAB — LACTATE DEHYDROGENASE: LDH: 143 U/L (ref 98–192)

## 2021-11-22 MED ORDER — DEXAMETHASONE 4 MG PO TABS
20.0000 mg | ORAL_TABLET | Freq: Once | ORAL | Status: AC
  Administered 2021-11-22: 20 mg via ORAL

## 2021-11-22 MED ORDER — CYANOCOBALAMIN 1000 MCG/ML IJ SOLN
1000.0000 ug | Freq: Once | INTRAMUSCULAR | Status: AC
  Administered 2021-11-22: 1000 ug via INTRAMUSCULAR
  Filled 2021-11-22: qty 1

## 2021-11-22 MED ORDER — BORTEZOMIB CHEMO SQ INJECTION 3.5 MG (2.5MG/ML)
1.3000 mg/m2 | Freq: Once | INTRAMUSCULAR | Status: AC
  Administered 2021-11-22: 2 mg via SUBCUTANEOUS
  Filled 2021-11-22: qty 0.8

## 2021-11-22 NOTE — Progress Notes (Signed)
?[facility] ?[phone]   [phone] ? ?PROGRESS NOTE ? ?Patient Care Team: ?Burnard Bunting, MD as PCP - General (Internal Medicine) ? ?Hematological/Oncological History ?# IgG Lambda Multiple Myeloma ?06/04/2021: WBC 5.5, Hgb 11.1, MCV 101.3, Plt 306 ?07/24/2021: M protein 2.3, IFE shows IgG Lambda monoclonal specificity. Cr 0.9 ?08/07/2021: Establish care with Dr. Lorenso Courier ?09/27/2021: Bmbx showed hypercellular bone marrow with plasma cell neoplasm, increased number of atypical plasma cells averaging 20% of all cells in the aspirate ?10/18/2021: Cycle 1 of Velcade/Dexamethasone ?11/08/2021: Cycle 2 of Velcade/Dexamethasone ? ?Interval History:  ?[name] Page 88 y.o. female with medical history significant for newly diagnosed IgG lambda multiple myeloma who presents for a follow up visit. The patient's last visit was on 10/24/2020. In the interim, patient completed Cycle 1 of Velcade/Dexamethasone. She is due for Cycle 2, Day 1. ? ?On exam today [name] Page is accompanied by her husband.  She reports that she did get somewhat disoriented last time she received therapy on Saturday/Sunday.  She also notes that she did have a good bit of diarrhea the week and following she reports that she has been having issues with fatigue but fortunately no neuropathy.  Overall she is tolerating treatment well and is willing and able to proceed.  Patient has a stable appetite without any weight loss.  Patient denies any fevers, chills, night sweats, shortness of breath, chest pain or cough.She has no other complaints.  Rest of the 10 point ROS is below ? ?MEDICAL HISTORY:  ?Past Medical History:  ?Diagnosis Date  ? Alopecia 10/28/2016  ? Anginal pain ([place])   ? Arthritis   ? Cervical spondylosis   ? C3-4 AND C4-5  ? Chest pain 07/28/2008  ? H/O, normal stress nuclear EF 78%  ? Chronic cough 07/12/2014  ? Collapsed lung 07/2010  ? being treated at [facility] - in left lung  ? Complication of anesthesia    ? Cough 07/30/2010  ? Qualifier: Diagnosis of  By: Chase Caller MD, Murali    ? Cystocele 05/23/2015  ? Cystocele, midline 05/02/2013  ? DEEP VEIN THROMBOSIS/PHLEBITIS 07/30/2010  ? Qualifier: History of  By: Harvest Dark CMA, Anderson Malta    ? Dislocation closed, shoulder 02/2013  ? DVT (deep venous thrombosis) ([place]) 2006  ? Encounter for therapeutic drug monitoring 10/03/2014  ? GERD (gastroesophageal reflux disease)   ? occ  ? Headache   ? hx  ? High cholesterol   ? History of recurrent UTIs 09/24/2011  ? HYPERLIPIDEMIA 07/30/2010  ? Qualifier: Diagnosis of  By: Harvest Dark CMA, Anderson Malta    ? Hypertension   ? HYPERTENSION 07/30/2010  ? Qualifier: Diagnosis of  By: Harvest Dark CMA, Anderson Malta    ? Hypothyroidism   ? Nasal itching 01/30/2014  ? Osteopenia 05/23/2015  ? Peripheral vascular disease ([place]) 2006  ? dvt's and 50 yrs ago  ? Pleuritic pain 12/17/2015  ? PONV (postoperative nausea and vomiting)   ? Primary localized osteoarthritis of right hip 08/26/2016  ? Primary osteoarthritis of right hip 08/26/2016  ? Pulmonary collapse 07/30/2010  ? Qualifier: Diagnosis of  By: Chase Caller MD, Murali    ? PULMONARY NODULE 07/30/2010  ? Qualifier: Diagnosis of  By: Chase Caller MD, Murali    ? Rash and nonspecific skin eruption 01/02/2015  ? 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 Sarcoidosis   ? Sarcoidosis of lung ([place])   ? Vaginal atrophy 09/24/2011  ? Visual field defect nasal step 01/02/2015  ? ? ?SURGICAL HISTORY: ?Past Surgical History:  ?  Procedure Laterality Date  ? BACK SURGERY  2022  ? EYE SURGERY    ? FOOT SURGERY Left 06/2005  ? bunion hammer toe  ? HEMORRHOID SURGERY  12/1972  ? INTRAOCULAR LENS INSERTION    ? right eye 10 /16 and left 06/16/2005  ? JOINT REPLACEMENT    ? KYPHOPLASTY N/A 06/27/2021  ? Procedure: LUMBAR 3 AND LUMBAR  5 KYPHOPLASTY;  Surgeon: Phylliss Bob, MD;  Location: [facility];  Service: Orthopedics;  Laterality: N/A;  ? KYPHOPLASTY N/A 09/04/2021  ? Procedure: THORACIC SEVEN, THORACIC EIGHT, THORACIC NINE,  THORACIC TEN KYPHOPLASTY;  Surgeon: Phylliss Bob, MD;  Location: [facility];  Service: Orthopedics;  Laterality: N/A;  ? SKIN BIOPSY  01/2007  ? basal cell carcinoma removed from right lower leg   ? TOTAL HIP ARTHROPLASTY  06/2007  ? left  ? TOTAL HIP ARTHROPLASTY Right 08/26/2016  ? Procedure: TOTAL HIP ARTHROPLASTY ANTERIOR APPROACH;  Surgeon: Melrose Nakayama, MD;  Location: [facility];  Service: Orthopedics;  Laterality: Right;  ? TOTAL KNEE ARTHROPLASTY Right 10/2007  ? right  ? ? ?SOCIAL HISTORY: ?Social History  ? ?Socioeconomic History  ? Marital status: Married  ?  Spouse name: Not on file  ? Number of children: Not on file  ? Years of education: Not on file  ? Highest education level: Not on file  ?Occupational History  ? Occupation: retired  ?  Employer: RETIRED  ?Tobacco Use  ? Smoking status: Never  ? Smokeless tobacco: Never  ?Vaping Use  ? Vaping Use: Never used  ?Substance and Sexual Activity  ? Alcohol use: Not Currently  ?  Comment: BEER/glass of wine A DAY  ? Drug use: No  ? Sexual activity: Never  ?Other Topics Concern  ? Not on file  ?Social History Narrative  ? Not on file  ? ?Social Determinants of Health  ? ?Financial Resource Strain: Not on file  ?Food Insecurity: Not on file  ?Transportation Needs: Not on file  ?Physical Activity: Not on file  ?Stress: Not on file  ?Social Connections: Not on file  ?Intimate Partner Violence: Not on file  ? ? ?FAMILY HISTORY: ?Family History  ?Problem Relation Age of Onset  ? Diabetes Mother   ? Heart failure Mother   ? Hypertension Father   ? Diabetes Father   ? Heart disease Father   ? Heart attack Father   ? ? ?ALLERGIES:  is allergic to methotrexate derivatives. ? ?MEDICATIONS:  ?Current Outpatient Medications  ?Medication Sig Dispense Refill  ? acyclovir (ZOVIRAX) 400 MG tablet Take 1 tablet (400 mg total) by mouth 2 (two) times daily. 60 tablet 2  ? amLODipine (NORVASC) 10 MG tablet Take 10 mg by mouth daily.    ? azelastine (OPTIVAR) 0.05 % ophthalmic  solution Place 1 drop into both eyes 2 (two) times daily.     ? Calcium Citrate-Vitamin D (CALCIUM + D PO) Take 1 tablet by mouth daily.    ? Cholecalciferol (VITAMIN D-1000 MAX ST) 25 MCG (1000 UT) tablet Take 1,000 Units by mouth daily.    ? gemfibrozil (LOPID) 600 MG tablet Take 600 mg by mouth 2 (two) times daily before a meal.    ? levothyroxine (SYNTHROID, LEVOTHROID) 25 MCG tablet Take 25 mcg by mouth daily.     ? LORazepam (ATIVAN) 0.5 MG tablet Take 0.5 mg by mouth daily as needed for anxiety or sleep.    ? LORazepam (ATIVAN) 1 MG tablet Take 0.5 mg by mouth every 8 (  eight) hours as needed.    ? losartan (COZAAR) 50 MG tablet Take 50 mg by mouth daily.    ? Multiple Vitamin (MULTIVITAMIN) capsule Take 1 capsule by mouth daily.    ? ondansetron (ZOFRAN) 8 MG tablet Take 1 tablet (8 mg total) by mouth every 8 (eight) hours as needed. 30 tablet 0  ? oxyCODONE (ROXICODONE) 5 MG immediate release tablet Take 1 tablet (5 mg total) by mouth every 6 (six) hours as needed for severe pain. 30 tablet 0  ? pantoprazole (PROTONIX) 20 MG tablet Take 20 mg by mouth daily.    ? polyethylene glycol powder (GLYCOLAX/MIRALAX) 17 GM/SCOOP powder Take 17 g by mouth at bedtime.    ? prednisoLONE acetate (PRED FORTE) 1 % ophthalmic suspension Place into the right eye.    ? predniSONE (DELTASONE) 1 MG tablet Take 0.5 tablets (0.5 mg total) by mouth daily with breakfast. For one month then stop. 15 tablet 0  ? prochlorperazine (COMPAZINE) 10 MG tablet Take 1 tablet (10 mg total) by mouth every 6 (six) hours as needed for nausea or vomiting. 30 tablet 0  ? tobramycin (TOBREX) 0.3 % ophthalmic solution Place into the right eye.    ? ?No current facility-administered medications for this visit.  ? ? ?REVIEW OF SYSTEMS:   ?Constitutional: ( - ) fevers, ( - )  chills , ( - ) night sweats ?Eyes: ( - ) blurriness of vision, ( - ) double vision, ( - ) watery eyes ?Ears, nose, mouth, throat, and face: ( - ) mucositis, ( - ) sore  throat ?Respiratory: ( - ) cough, ( - ) dyspnea, ( - ) wheezes ?Cardiovascular: ( - ) palpitation, ( - ) chest discomfort, ( - ) lower extremity swelling ?Gastrointestinal:  ( - ) nausea, ( - ) heartburn, ( - ) change in

## 2021-11-25 LAB — MULTIPLE MYELOMA PANEL, SERUM
Albumin SerPl Elph-Mcnc: 3.2 g/dL (ref 2.9–4.4)
Albumin/Glob SerPl: 1 (ref 0.7–1.7)
Alpha 1: 0.3 g/dL (ref 0.0–0.4)
Alpha2 Glob SerPl Elph-Mcnc: 0.8 g/dL (ref 0.4–1.0)
B-Globulin SerPl Elph-Mcnc: 2.1 g/dL — ABNORMAL HIGH (ref 0.7–1.3)
Gamma Glob SerPl Elph-Mcnc: 0.2 g/dL — ABNORMAL LOW (ref 0.4–1.8)
Globulin, Total: 3.4 g/dL (ref 2.2–3.9)
IgA: 10 mg/dL — ABNORMAL LOW (ref 64–422)
IgG (Immunoglobin G), Serum: 1116 mg/dL (ref 586–1602)
IgM (Immunoglobulin M), Srm: 15 mg/dL — ABNORMAL LOW (ref 26–217)
M Protein SerPl Elph-Mcnc: 1 g/dL — ABNORMAL HIGH
Total Protein ELP: 6.6 g/dL (ref 6.0–8.5)

## 2021-11-25 LAB — KAPPA/LAMBDA LIGHT CHAINS
Kappa free light chain: 11.2 mg/L (ref 3.3–19.4)
Kappa, lambda light chain ratio: 1.24 (ref 0.26–1.65)
Lambda free light chains: 9 mg/L (ref 5.7–26.3)

## 2021-11-26 ENCOUNTER — Telehealth: Payer: Self-pay | Admitting: Hematology and Oncology

## 2021-11-26 ENCOUNTER — Telehealth: Payer: Self-pay

## 2021-11-26 ENCOUNTER — Encounter: Payer: Self-pay | Admitting: Hematology and Oncology

## 2021-11-26 NOTE — Telephone Encounter (Signed)
Called Ms. Nazzaro to discuss her concerns about loose stools and dark bowel movements over the weekend.  Patient notes that she has been having abdominal cramping and discomfort over the weekend and had more explosive bowel movements on Sunday.  She notes that these 2 bowel movements were quite dark.  She reports that she has not had any dark bowel movements since then.  She continues to have some abdominal cramping.  Recommended that she increase her Protonix to 40 mg daily up from 20 mg p.o. daily.  We will also be rechecking labs on her on Friday during her scheduled injection.  Asked her to call if she had any further dark bowel movements or any worsening of her symptoms. ? ?Ledell Peoples, MD ?Department of Hematology/Oncology ?[facility] ?Phone: [phone] ?Pager: [phone] ?Email: [email or web address] ? ?

## 2021-11-26 NOTE — Telephone Encounter (Signed)
Patient and her spouse called to report that beginning Friday after tx patient began having generalized abdominal pains approximately 2 inches below umbilicus. Starting on Saturday patient had diarrhea with 5 formed stools. On Sunday patient describes having "explosion with liquid diarrhea which was black in color. The pain has been a consistent "8" in intensity and describes as "cramping." Pt reports diarrhea has resolved but cramping pain has not. No fever. Patient reports that she has experienced diarrhea after tx in past but not this cramping pain. ? ?Routed to provider. ?

## 2021-11-28 ENCOUNTER — Telehealth: Payer: Self-pay

## 2021-11-28 NOTE — Telephone Encounter (Signed)
Notified pt of the following  message from Dr. Lorenso Courier: ? ?Please let [name] Page know that she had a strong response to chemotherapy. Her M protein went from 3.2 down to 1.0. This is an excellent response. We will continue with the current chemotherapy plan if she is agreeable.  ?

## 2021-11-28 NOTE — Telephone Encounter (Signed)
-----   Message from Otila Kluver, RN sent at 11/28/2021  8:56 AM EDT ----- ? ?----- Message ----- ?From: Orson Slick, MD ?Sent: 11/26/2021  10:01 AM EDT ?To: Otila Kluver, RN ? ?Please let [name] Page know that she had a strong response to chemotherapy. Her M protein went from 3.2 down to 1.0. This is an excellent response. We will continue with the current chemotherapy plan if she is agreeable.  ? ?----- Message ----- ?From: Interface, Lab In [place] ?Sent: 11/22/2021   2:09 PM EDT ?To: Orson Slick, MD ? ? ? ?

## 2021-11-29 ENCOUNTER — Other Ambulatory Visit: Payer: Self-pay | Admitting: Hematology and Oncology

## 2021-11-29 ENCOUNTER — Ambulatory Visit: Payer: Medicare PPO | Admitting: Hematology and Oncology

## 2021-11-29 ENCOUNTER — Other Ambulatory Visit: Payer: Medicare PPO

## 2021-11-29 ENCOUNTER — Other Ambulatory Visit: Payer: Self-pay | Admitting: *Deleted

## 2021-11-29 ENCOUNTER — Other Ambulatory Visit: Payer: Self-pay

## 2021-11-29 ENCOUNTER — Inpatient Hospital Stay: Payer: Medicare PPO

## 2021-11-29 VITALS — BP 127/53 | HR 64 | Temp 98.3°F | Resp 18

## 2021-11-29 DIAGNOSIS — Z5112 Encounter for antineoplastic immunotherapy: Secondary | ICD-10-CM | POA: Diagnosis not present

## 2021-11-29 DIAGNOSIS — Z800 Family history of malignant neoplasm of digestive organs: Secondary | ICD-10-CM | POA: Diagnosis not present

## 2021-11-29 DIAGNOSIS — C9000 Multiple myeloma not having achieved remission: Secondary | ICD-10-CM

## 2021-11-29 DIAGNOSIS — E538 Deficiency of other specified B group vitamins: Secondary | ICD-10-CM

## 2021-11-29 LAB — CMP (CANCER CENTER ONLY)
ALT: 8 U/L (ref 0–44)
AST: 20 U/L (ref 15–41)
Albumin: 3.7 g/dL (ref 3.5–5.0)
Alkaline Phosphatase: 167 U/L — ABNORMAL HIGH (ref 38–126)
Anion gap: 9 (ref 5–15)
BUN: 22 mg/dL (ref 8–23)
CO2: 20 mmol/L — ABNORMAL LOW (ref 22–32)
Calcium: 8.5 mg/dL — ABNORMAL LOW (ref 8.9–10.3)
Chloride: 107 mmol/L (ref 98–111)
Creatinine: 0.9 mg/dL (ref 0.44–1.00)
GFR, Estimated: >60 mL/min (ref >60)
Glucose, Bld: 124 mg/dL — ABNORMAL HIGH (ref 70–99)
Potassium: 4.6 mmol/L (ref 3.5–5.1)
Sodium: 136 mmol/L (ref 135–145)
Total Bilirubin: 0.3 mg/dL (ref 0.3–1.2)
Total Protein: 7 g/dL (ref 6.5–8.1)

## 2021-11-29 LAB — CBC WITH DIFFERENTIAL (CANCER CENTER ONLY)
Abs Immature Granulocytes: 0.03 10*3/uL (ref 0.00–0.07)
Basophils Absolute: 0 10*3/uL (ref 0.0–0.1)
Basophils Relative: 0 %
Eosinophils Absolute: 0 10*3/uL (ref 0.0–0.5)
Eosinophils Relative: 0 %
HCT: 24.3 % — ABNORMAL LOW (ref 36.0–46.0)
Hemoglobin: 8.3 g/dL — ABNORMAL LOW (ref 12.0–15.0)
Immature Granulocytes: 0 %
Lymphocytes Relative: 9 %
Lymphs Abs: 0.7 10*3/uL (ref 0.7–4.0)
MCH: 34.9 pg — ABNORMAL HIGH (ref 26.0–34.0)
MCHC: 34.2 g/dL (ref 30.0–36.0)
MCV: 102.1 fL — ABNORMAL HIGH (ref 80.0–100.0)
Monocytes Absolute: 0.5 10*3/uL (ref 0.1–1.0)
Monocytes Relative: 7 %
Neutro Abs: 5.7 10*3/uL (ref 1.7–7.7)
Neutrophils Relative %: 84 %
Platelet Count: 214 10*3/uL (ref 150–400)
RBC: 2.38 MIL/uL — ABNORMAL LOW (ref 3.87–5.11)
RDW: 14.2 % (ref 11.5–15.5)
WBC Count: 6.9 10*3/uL (ref 4.0–10.5)
nRBC: 0 % (ref 0.0–0.2)

## 2021-11-29 LAB — LACTATE DEHYDROGENASE: LDH: 140 U/L (ref 98–192)

## 2021-11-29 MED ORDER — CYANOCOBALAMIN 1000 MCG/ML IJ SOLN
1000.0000 ug | Freq: Once | INTRAMUSCULAR | Status: AC
  Administered 2021-11-29: 1000 ug via INTRAMUSCULAR
  Filled 2021-11-29: qty 1

## 2021-11-29 MED ORDER — BORTEZOMIB CHEMO SQ INJECTION 3.5 MG (2.5MG/ML)
1.3000 mg/m2 | Freq: Once | INTRAMUSCULAR | Status: AC
  Administered 2021-11-29: 2 mg via SUBCUTANEOUS
  Filled 2021-11-29: qty 0.8

## 2021-11-29 MED ORDER — DEXAMETHASONE 4 MG PO TABS
20.0000 mg | ORAL_TABLET | Freq: Once | ORAL | Status: AC
  Administered 2021-11-29: 20 mg via ORAL
  Filled 2021-11-29: qty 5

## 2021-11-29 NOTE — Patient Instructions (Signed)
[facility]  Discharge Instructions: Thank you for choosing [facility] to provide your oncology and hematology care.   If you have a lab appointment with the Cancer Center, please go directly to the Cancer Center and check in at the registration area.   Wear comfortable clothing and clothing appropriate for easy access to any Portacath or PICC line.   We strive to give you quality time with your provider. You may need to reschedule your appointment if you arrive late (15 or more minutes).  Arriving late affects you and other patients whose appointments are after yours.  Also, if you miss three or more appointments without notifying the office, you may be dismissed from the clinic at the provider's discretion.      For prescription refill requests, have your pharmacy contact our office and allow 72 hours for refills to be completed.    Today you received the following chemotherapy and/or immunotherapy agents Velcade       To help prevent nausea and vomiting after your treatment, we encourage you to take your nausea medication as directed.  BELOW ARE SYMPTOMS THAT SHOULD BE REPORTED IMMEDIATELY: *FEVER GREATER THAN 100.4 F (38 C) OR HIGHER *CHILLS OR SWEATING *NAUSEA AND VOMITING THAT IS NOT CONTROLLED WITH YOUR NAUSEA MEDICATION *UNUSUAL SHORTNESS OF BREATH *UNUSUAL BRUISING OR BLEEDING *URINARY PROBLEMS (pain or burning when urinating, or frequent urination) *BOWEL PROBLEMS (unusual diarrhea, constipation, pain near the anus) TENDERNESS IN MOUTH AND THROAT WITH OR WITHOUT PRESENCE OF ULCERS (sore throat, sores in mouth, or a toothache) UNUSUAL RASH, SWELLING OR PAIN  UNUSUAL VAGINAL DISCHARGE OR ITCHING   Items with * indicate a potential emergency and should be followed up as soon as possible or go to the Emergency Department if any problems should occur.  Please show the CHEMOTHERAPY ALERT CARD or IMMUNOTHERAPY ALERT CARD at check-in to  the Emergency Department and triage nurse.  Should you have questions after your visit or need to cancel or reschedule your appointment, please contact [facility]  Dept: [phone]  and follow the prompts.  Office hours are 8:00 a.m. to 4:30 p.m. Monday - Friday. Please note that voicemails left after 4:00 p.m. may not be returned until the following business day.  We are closed weekends and major holidays. You have access to a nurse at all times for urgent questions. Please call the main number to the clinic Dept: [phone] and follow the prompts.   For any non-urgent questions, you may also contact your provider using MyChart. We now offer e-Visits for anyone 18 and older to request care online for non-urgent symptoms. For details visit [email or web address].   Also download the MyChart app! Go to the app store, search "MyChart", open the app, select [facility], and log in with your MyChart username and password.  Due to Covid, a mask is required upon entering the hospital/clinic. If you do not have a mask, one will be given to you upon arrival. For doctor visits, patients may have 1 support Eleftheria Taborn aged 18 or older with them. For treatment visits, patients cannot have anyone with them due to current Covid guidelines and our immunocompromised population.   

## 2021-12-06 ENCOUNTER — Inpatient Hospital Stay: Payer: Medicare PPO

## 2021-12-06 ENCOUNTER — Other Ambulatory Visit: Payer: Self-pay | Admitting: Hematology and Oncology

## 2021-12-06 ENCOUNTER — Inpatient Hospital Stay: Payer: Medicare PPO | Admitting: Hematology and Oncology

## 2021-12-06 ENCOUNTER — Other Ambulatory Visit: Payer: Self-pay

## 2021-12-06 ENCOUNTER — Inpatient Hospital Stay: Payer: Medicare PPO | Admitting: Dietician

## 2021-12-06 VITALS — BP 127/54 | HR 79 | Temp 97.9°F | Resp 15 | Wt 116.4 lb

## 2021-12-06 DIAGNOSIS — C9000 Multiple myeloma not having achieved remission: Secondary | ICD-10-CM

## 2021-12-06 DIAGNOSIS — Z5112 Encounter for antineoplastic immunotherapy: Secondary | ICD-10-CM | POA: Diagnosis not present

## 2021-12-06 DIAGNOSIS — Z800 Family history of malignant neoplasm of digestive organs: Secondary | ICD-10-CM | POA: Diagnosis not present

## 2021-12-06 DIAGNOSIS — E538 Deficiency of other specified B group vitamins: Secondary | ICD-10-CM

## 2021-12-06 LAB — CBC WITH DIFFERENTIAL (CANCER CENTER ONLY)
Abs Immature Granulocytes: 0.02 10*3/uL (ref 0.00–0.07)
Basophils Absolute: 0 10*3/uL (ref 0.0–0.1)
Basophils Relative: 0 %
Eosinophils Absolute: 0 10*3/uL (ref 0.0–0.5)
Eosinophils Relative: 1 %
HCT: 24.6 % — ABNORMAL LOW (ref 36.0–46.0)
Hemoglobin: 8.4 g/dL — ABNORMAL LOW (ref 12.0–15.0)
Immature Granulocytes: 0 %
Lymphocytes Relative: 17 %
Lymphs Abs: 0.8 10*3/uL (ref 0.7–4.0)
MCH: 34.9 pg — ABNORMAL HIGH (ref 26.0–34.0)
MCHC: 34.1 g/dL (ref 30.0–36.0)
MCV: 102.1 fL — ABNORMAL HIGH (ref 80.0–100.0)
Monocytes Absolute: 0.5 10*3/uL (ref 0.1–1.0)
Monocytes Relative: 11 %
Neutro Abs: 3.3 10*3/uL (ref 1.7–7.7)
Neutrophils Relative %: 71 %
Platelet Count: 231 10*3/uL (ref 150–400)
RBC: 2.41 MIL/uL — ABNORMAL LOW (ref 3.87–5.11)
RDW: 14.1 % (ref 11.5–15.5)
WBC Count: 4.6 10*3/uL (ref 4.0–10.5)
nRBC: 0 % (ref 0.0–0.2)

## 2021-12-06 LAB — CMP (CANCER CENTER ONLY)
ALT: 8 U/L (ref 0–44)
AST: 18 U/L (ref 15–41)
Albumin: 3.8 g/dL (ref 3.5–5.0)
Alkaline Phosphatase: 155 U/L — ABNORMAL HIGH (ref 38–126)
Anion gap: 4 — ABNORMAL LOW (ref 5–15)
BUN: 20 mg/dL (ref 8–23)
CO2: 22 mmol/L (ref 22–32)
Calcium: 8.8 mg/dL — ABNORMAL LOW (ref 8.9–10.3)
Chloride: 106 mmol/L (ref 98–111)
Creatinine: 0.87 mg/dL (ref 0.44–1.00)
GFR, Estimated: >60 mL/min (ref >60)
Glucose, Bld: 132 mg/dL — ABNORMAL HIGH (ref 70–99)
Potassium: 4.6 mmol/L (ref 3.5–5.1)
Sodium: 132 mmol/L — ABNORMAL LOW (ref 135–145)
Total Bilirubin: 0.3 mg/dL (ref 0.3–1.2)
Total Protein: 7 g/dL (ref 6.5–8.1)

## 2021-12-06 LAB — LACTATE DEHYDROGENASE: LDH: 145 U/L (ref 98–192)

## 2021-12-06 MED ORDER — DEXAMETHASONE 4 MG PO TABS
20.0000 mg | ORAL_TABLET | Freq: Once | ORAL | Status: AC
  Administered 2021-12-06: 20 mg via ORAL
  Filled 2021-12-06: qty 5

## 2021-12-06 MED ORDER — CYANOCOBALAMIN 1000 MCG/ML IJ SOLN
1000.0000 ug | Freq: Once | INTRAMUSCULAR | Status: AC
  Administered 2021-12-06: 1000 ug via INTRAMUSCULAR
  Filled 2021-12-06: qty 1

## 2021-12-06 MED ORDER — BORTEZOMIB CHEMO SQ INJECTION 3.5 MG (2.5MG/ML)
1.3000 mg/m2 | Freq: Once | INTRAMUSCULAR | Status: AC
  Administered 2021-12-06: 2 mg via SUBCUTANEOUS
  Filled 2021-12-06: qty 0.8

## 2021-12-06 NOTE — Progress Notes (Signed)
Nutrition Follow-up: ? ?Patient with multiple myeloma. She is receiving Velcade/dexamethasone.  ? ?Met with patient during infusion. She reports tolerating treatment well. Patient reports mild diarrhea has resolved after a few changes to her medications. Patient denies nutrition impact symptoms. She continues eating 3 meals plus snacks. Patient reports drinking "quite a bit" of water, unable to quantify. She enjoys walking. Patient does have mild ankle edema from steroid treatment. She does not wish to wear compression hose. Patient is keeping her feet elevated.  ? ? ?Medications: reviewed  ? ?Labs: Na 132, glucose 132 ? ?Anthropometrics: Weight 116 lb 6.4 oz today increased ? ?3/31 - 114 lb 4.8 oz (pt with mild ankle edema) ? ? ?NUTRITION DIAGNOSIS: Unintentional weight loss improved  ? ? ?INTERVENTION:  ?Continue eating high protein snacks in between meals ?Suggested trying ONS for added calories and protein if appetite/intake declines - provided coupons for Ensure  ?  ? ?MONITORING, EVALUATION, GOAL: weight trends, intake ? ? ?NEXT VISIT: No follow-up scheduled at this time. Patient has contact information and encouraged to contact with nutrition questions/concerns ? ? ? ?

## 2021-12-06 NOTE — Patient Instructions (Signed)
[facility]  Discharge Instructions: Thank you for choosing [facility] to provide your oncology and hematology care.   If you have a lab appointment with the Cancer Center, please go directly to the Cancer Center and check in at the registration area.   Wear comfortable clothing and clothing appropriate for easy access to any Portacath or PICC line.   We strive to give you quality time with your provider. You may need to reschedule your appointment if you arrive late (15 or more minutes).  Arriving late affects you and other patients whose appointments are after yours.  Also, if you miss three or more appointments without notifying the office, you may be dismissed from the clinic at the provider's discretion.      For prescription refill requests, have your pharmacy contact our office and allow 72 hours for refills to be completed.    Today you received the following chemotherapy and/or immunotherapy agents Velcade       To help prevent nausea and vomiting after your treatment, we encourage you to take your nausea medication as directed.  BELOW ARE SYMPTOMS THAT SHOULD BE REPORTED IMMEDIATELY: *FEVER GREATER THAN 100.4 F (38 C) OR HIGHER *CHILLS OR SWEATING *NAUSEA AND VOMITING THAT IS NOT CONTROLLED WITH YOUR NAUSEA MEDICATION *UNUSUAL SHORTNESS OF BREATH *UNUSUAL BRUISING OR BLEEDING *URINARY PROBLEMS (pain or burning when urinating, or frequent urination) *BOWEL PROBLEMS (unusual diarrhea, constipation, pain near the anus) TENDERNESS IN MOUTH AND THROAT WITH OR WITHOUT PRESENCE OF ULCERS (sore throat, sores in mouth, or a toothache) UNUSUAL RASH, SWELLING OR PAIN  UNUSUAL VAGINAL DISCHARGE OR ITCHING   Items with * indicate a potential emergency and should be followed up as soon as possible or go to the Emergency Department if any problems should occur.  Please show the CHEMOTHERAPY ALERT CARD or IMMUNOTHERAPY ALERT CARD at check-in to  the Emergency Department and triage nurse.  Should you have questions after your visit or need to cancel or reschedule your appointment, please contact [facility]  Dept: [phone]  and follow the prompts.  Office hours are 8:00 a.m. to 4:30 p.m. Monday - Friday. Please note that voicemails left after 4:00 p.m. may not be returned until the following business day.  We are closed weekends and major holidays. You have access to a nurse at all times for urgent questions. Please call the main number to the clinic Dept: [phone] and follow the prompts.   For any non-urgent questions, you may also contact your provider using MyChart. We now offer e-Visits for anyone 18 and older to request care online for non-urgent symptoms. For details visit [email or web address].   Also download the MyChart app! Go to the app store, search "MyChart", open the app, select [facility], and log in with your MyChart username and password.  Due to Covid, a mask is required upon entering the hospital/clinic. If you do not have a mask, one will be given to you upon arrival. For doctor visits, patients may have 1 support Tilia Faso aged 86 or older with them. For treatment visits, patients cannot have anyone with them due to current Covid guidelines and our immunocompromised population.   

## 2021-12-06 NOTE — Progress Notes (Signed)
?[place] ?[phone]   [phone] ? ?PROGRESS NOTE ? ?Patient Care Team: ?Burnard Bunting, MD as PCP - General (Internal Medicine) ? ?Hematological/Oncological History ?# IgG Lambda Multiple Myeloma ?06/04/2021: WBC 5.5, Hgb 11.1, MCV 101.3, Plt 306 ?07/24/2021: M protein 2.3, IFE shows IgG Lambda monoclonal specificity. Cr 0.9 ?08/07/2021: Establish care with Dr. Lorenso Courier ?09/27/2021: Bmbx showed hypercellular bone marrow with plasma cell neoplasm, increased number of atypical plasma cells averaging 20% of all cells in the aspirate ?10/18/2021: Cycle 1 of Velcade/Dexamethasone ?11/08/2021: Cycle 2 of Velcade/Dexamethasone ?11/29/2021: Cycle 3 of Velcade/Dexamethasone ? ?Interval History:  ?[name] Page 88 y.o. female with medical history significant for newly diagnosed IgG lambda multiple myeloma who presents for a follow up visit. The patient's last visit was on 11/22/2020. In the interim, patient completed Cycle 1 of Velcade/Dexamethasone. She is due for Cycle 3, Day 8. ? ?On exam today [name] Page is accompanied by her husband.  She reports she feels as though she is improving from her last visit.  She reports that she is not currently in a lot of pain.  She is currently taking hydrocodone 5-325 that she had leftover from prior.  She notes the oxycodone is not helping as much.  She notes that she continues to have some pain in her back but it is well controlled.  She denies any numbness or tingling of her fingers and toes.  She reports she did have some diarrhea about 2 to 3 weeks ago but has not had any subsequently.  She denies any nausea or vomiting.  Patient denies any fevers, chills, night sweats, shortness of breath, chest pain or cough.She has no other complaints.  Rest of the 10 point ROS is below ? ?MEDICAL HISTORY:  ?Past Medical History:  ?Diagnosis Date  ? Alopecia 10/28/2016  ? Anginal pain ([place])   ? Arthritis   ? Cervical spondylosis   ? C3-4 AND C4-5  ? Chest  pain 07/28/2008  ? H/O, normal stress nuclear EF 78%  ? Chronic cough 07/12/2014  ? Collapsed lung 07/2010  ? being treated at [facility] - in left lung  ? Complication of anesthesia   ? Cough 07/30/2010  ? Qualifier: Diagnosis of  By: Chase Caller MD, Murali    ? Cystocele 05/23/2015  ? Cystocele, midline 05/02/2013  ? DEEP VEIN THROMBOSIS/PHLEBITIS 07/30/2010  ? Qualifier: History of  By: Harvest Dark CMA, Anderson Malta    ? Dislocation closed, shoulder 02/2013  ? DVT (deep venous thrombosis) ([place]) 2006  ? Encounter for therapeutic drug monitoring 10/03/2014  ? GERD (gastroesophageal reflux disease)   ? occ  ? Headache   ? hx  ? High cholesterol   ? History of recurrent UTIs 09/24/2011  ? HYPERLIPIDEMIA 07/30/2010  ? Qualifier: Diagnosis of  By: Harvest Dark CMA, Anderson Malta    ? Hypertension   ? HYPERTENSION 07/30/2010  ? Qualifier: Diagnosis of  By: Harvest Dark CMA, Anderson Malta    ? Hypothyroidism   ? Nasal itching 01/30/2014  ? Osteopenia 05/23/2015  ? Peripheral vascular disease ([place]) 2006  ? dvt's and 50 yrs ago  ? Pleuritic pain 12/17/2015  ? PONV (postoperative nausea and vomiting)   ? Primary localized osteoarthritis of right hip 08/26/2016  ? Primary osteoarthritis of right hip 08/26/2016  ? Pulmonary collapse 07/30/2010  ? Qualifier: Diagnosis of  By: Chase Caller MD, Murali    ? PULMONARY NODULE 07/30/2010  ? Qualifier: Diagnosis of  By: Chase Caller MD, Murali    ? Rash and nonspecific skin eruption  01/02/2015  ? 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 Sarcoidosis   ? Sarcoidosis of lung ([place])   ? Vaginal atrophy 09/24/2011  ? Visual field defect nasal step 01/02/2015  ? ? ?SURGICAL HISTORY: ?Past Surgical History:  ?Procedure Laterality Date  ? BACK SURGERY  2022  ? EYE SURGERY    ? FOOT SURGERY Left 06/2005  ? bunion hammer toe  ? HEMORRHOID SURGERY  12/1972  ? INTRAOCULAR LENS INSERTION    ? right eye 10 /16 and left 06/16/2005  ? JOINT REPLACEMENT    ? KYPHOPLASTY N/A 06/27/2021  ? Procedure: LUMBAR 3 AND LUMBAR  5 KYPHOPLASTY;   Surgeon: Phylliss Bob, MD;  Location: [place];  Service: Orthopedics;  Laterality: N/A;  ? KYPHOPLASTY N/A 09/04/2021  ? Procedure: THORACIC SEVEN, THORACIC EIGHT, THORACIC NINE, THORACIC TEN KYPHOPLASTY;  Surgeon: Phylliss Bob, MD;  Location: [place];  Service: Orthopedics;  Laterality: N/A;  ? SKIN BIOPSY  01/2007  ? basal cell carcinoma removed from right lower leg   ? TOTAL HIP ARTHROPLASTY  06/2007  ? left  ? TOTAL HIP ARTHROPLASTY Right 08/26/2016  ? Procedure: TOTAL HIP ARTHROPLASTY ANTERIOR APPROACH;  Surgeon: Melrose Nakayama, MD;  Location: [place];  Service: Orthopedics;  Laterality: Right;  ? TOTAL KNEE ARTHROPLASTY Right 10/2007  ? right  ? ? ?SOCIAL HISTORY: ?Social History  ? ?Socioeconomic History  ? Marital status: Married  ?  Spouse name: Not on file  ? Number of children: Not on file  ? Years of education: Not on file  ? Highest education level: Not on file  ?Occupational History  ? Occupation: retired  ?  Employer: RETIRED  ?Tobacco Use  ? Smoking status: Never  ? Smokeless tobacco: Never  ?Vaping Use  ? Vaping Use: Never used  ?Substance and Sexual Activity  ? Alcohol use: Not Currently  ?  Comment: BEER/glass of wine A DAY  ? Drug use: No  ? Sexual activity: Never  ?Other Topics Concern  ? Not on file  ?Social History Narrative  ? Not on file  ? ?Social Determinants of Health  ? ?Financial Resource Strain: Not on file  ?Food Insecurity: Not on file  ?Transportation Needs: Not on file  ?Physical Activity: Not on file  ?Stress: Not on file  ?Social Connections: Not on file  ?Intimate Partner Violence: Not on file  ? ? ?FAMILY HISTORY: ?Family History  ?Problem Relation Age of Onset  ? Diabetes Mother   ? Heart failure Mother   ? Hypertension Father   ? Diabetes Father   ? Heart disease Father   ? Heart attack Father   ? ? ?ALLERGIES:  has no active allergies. ? ?MEDICATIONS:  ?Current Outpatient Medications  ?Medication Sig Dispense Refill  ? HYDROcodone-acetaminophen (NORCO/VICODIN) 5-325 MG tablet  Take 1 tablet by mouth every 6 (six) hours as needed for moderate pain. 30 tablet 0  ? acyclovir (ZOVIRAX) 400 MG tablet Take 1 tablet (400 mg total) by mouth 2 (two) times daily. 60 tablet 2  ? amLODipine (NORVASC) 10 MG tablet Take 10 mg by mouth daily.    ? azelastine (OPTIVAR) 0.05 % ophthalmic solution Place 1 drop into both eyes 2 (two) times daily.     ? Calcium Citrate-Vitamin D (CALCIUM + D PO) Take 1 tablet by mouth daily.    ? Cholecalciferol (VITAMIN D-1000 MAX ST) 25 MCG (1000 UT) tablet Take 1,000 Units by mouth daily.    ? gemfibrozil (LOPID) 600 MG tablet Take 600  mg by mouth 2 (two) times daily before a meal.    ? levothyroxine (SYNTHROID, LEVOTHROID) 25 MCG tablet Take 25 mcg by mouth daily.     ? LORazepam (ATIVAN) 0.5 MG tablet Take 0.5 mg by mouth daily as needed for anxiety or sleep.    ? LORazepam (ATIVAN) 1 MG tablet Take 0.5 mg by mouth every 8 (eight) hours as needed.    ? losartan (COZAAR) 50 MG tablet Take 50 mg by mouth daily.    ? Multiple Vitamin (MULTIVITAMIN) capsule Take 1 capsule by mouth daily.    ? ondansetron (ZOFRAN) 8 MG tablet Take 1 tablet (8 mg total) by mouth every 8 (eight) hours as needed. 30 tablet 0  ? oxyCODONE (ROXICODONE) 5 MG immediate release tablet Take 1 tablet (5 mg total) by mouth every 6 (six) hours as needed for severe pain. 30 tablet 0  ? pantoprazole (PROTONIX) 20 MG tablet Take 20 mg by mouth daily.    ? polyethylene glycol powder (GLYCOLAX/MIRALAX) 17 GM/SCOOP powder Take 17 g by mouth at bedtime.    ? prednisoLONE acetate (PRED FORTE) 1 % ophthalmic suspension Place into the right eye.    ? predniSONE (DELTASONE) 1 MG tablet Take 0.5 tablets (0.5 mg total) by mouth daily with breakfast. For one month then stop. 15 tablet 0  ? prochlorperazine (COMPAZINE) 10 MG tablet Take 1 tablet (10 mg total) by mouth every 6 (six) hours as needed for nausea or vomiting. 30 tablet 0  ? tobramycin (TOBREX) 0.3 % ophthalmic solution Place into the right eye.    ? ?No  current facility-administered medications for this visit.  ? ? ?REVIEW OF SYSTEMS:   ?Constitutional: ( - ) fevers, ( - )  chills , ( - ) night sweats ?Eyes: ( - ) blurriness of vision, ( - ) double vision,

## 2021-12-12 ENCOUNTER — Other Ambulatory Visit: Payer: Self-pay | Admitting: Hematology and Oncology

## 2021-12-12 DIAGNOSIS — C9000 Multiple myeloma not having achieved remission: Secondary | ICD-10-CM

## 2021-12-12 DIAGNOSIS — S81812A Laceration without foreign body, left lower leg, initial encounter: Secondary | ICD-10-CM | POA: Diagnosis not present

## 2021-12-13 ENCOUNTER — Other Ambulatory Visit: Payer: Self-pay

## 2021-12-13 ENCOUNTER — Inpatient Hospital Stay: Payer: Medicare PPO

## 2021-12-13 ENCOUNTER — Inpatient Hospital Stay: Payer: Medicare PPO | Attending: Hematology and Oncology

## 2021-12-13 VITALS — BP 112/52 | HR 65 | Temp 98.6°F | Resp 16 | Wt 116.5 lb

## 2021-12-13 DIAGNOSIS — E538 Deficiency of other specified B group vitamins: Secondary | ICD-10-CM

## 2021-12-13 DIAGNOSIS — C9000 Multiple myeloma not having achieved remission: Secondary | ICD-10-CM | POA: Diagnosis not present

## 2021-12-13 DIAGNOSIS — Z5112 Encounter for antineoplastic immunotherapy: Secondary | ICD-10-CM | POA: Diagnosis not present

## 2021-12-13 DIAGNOSIS — Z800 Family history of malignant neoplasm of digestive organs: Secondary | ICD-10-CM | POA: Diagnosis not present

## 2021-12-13 LAB — CBC WITH DIFFERENTIAL (CANCER CENTER ONLY)
Abs Immature Granulocytes: 0.02 10*3/uL (ref 0.00–0.07)
Basophils Absolute: 0 10*3/uL (ref 0.0–0.1)
Basophils Relative: 0 %
Eosinophils Absolute: 0.1 10*3/uL (ref 0.0–0.5)
Eosinophils Relative: 2 %
HCT: 24.5 % — ABNORMAL LOW (ref 36.0–46.0)
Hemoglobin: 8.3 g/dL — ABNORMAL LOW (ref 12.0–15.0)
Immature Granulocytes: 0 %
Lymphocytes Relative: 19 %
Lymphs Abs: 0.9 10*3/uL (ref 0.7–4.0)
MCH: 34.6 pg — ABNORMAL HIGH (ref 26.0–34.0)
MCHC: 33.9 g/dL (ref 30.0–36.0)
MCV: 102.1 fL — ABNORMAL HIGH (ref 80.0–100.0)
Monocytes Absolute: 0.6 10*3/uL (ref 0.1–1.0)
Monocytes Relative: 12 %
Neutro Abs: 3.1 10*3/uL (ref 1.7–7.7)
Neutrophils Relative %: 67 %
Platelet Count: 222 10*3/uL (ref 150–400)
RBC: 2.4 MIL/uL — ABNORMAL LOW (ref 3.87–5.11)
RDW: 13.9 % (ref 11.5–15.5)
WBC Count: 4.6 10*3/uL (ref 4.0–10.5)
nRBC: 0 % (ref 0.0–0.2)

## 2021-12-13 LAB — CMP (CANCER CENTER ONLY)
ALT: 9 U/L (ref 0–44)
AST: 19 U/L (ref 15–41)
Albumin: 3.8 g/dL (ref 3.5–5.0)
Alkaline Phosphatase: 146 U/L — ABNORMAL HIGH (ref 38–126)
Anion gap: 8 (ref 5–15)
BUN: 16 mg/dL (ref 8–23)
CO2: 21 mmol/L — ABNORMAL LOW (ref 22–32)
Calcium: 8.7 mg/dL — ABNORMAL LOW (ref 8.9–10.3)
Chloride: 104 mmol/L (ref 98–111)
Creatinine: 1.01 mg/dL — ABNORMAL HIGH (ref 0.44–1.00)
GFR, Estimated: 54 mL/min — ABNORMAL LOW (ref >60)
Glucose, Bld: 105 mg/dL — ABNORMAL HIGH (ref 70–99)
Potassium: 4.4 mmol/L (ref 3.5–5.1)
Sodium: 133 mmol/L — ABNORMAL LOW (ref 135–145)
Total Bilirubin: 0.4 mg/dL (ref 0.3–1.2)
Total Protein: 7.2 g/dL (ref 6.5–8.1)

## 2021-12-13 LAB — LACTATE DEHYDROGENASE: LDH: 153 U/L (ref 98–192)

## 2021-12-13 MED ORDER — CYANOCOBALAMIN 1000 MCG/ML IJ SOLN
1000.0000 ug | Freq: Once | INTRAMUSCULAR | Status: AC
  Administered 2021-12-13: 1000 ug via INTRAMUSCULAR
  Filled 2021-12-13: qty 1

## 2021-12-13 MED ORDER — DEXAMETHASONE 4 MG PO TABS
20.0000 mg | ORAL_TABLET | Freq: Once | ORAL | Status: AC
  Administered 2021-12-13: 20 mg via ORAL

## 2021-12-13 MED ORDER — BORTEZOMIB CHEMO SQ INJECTION 3.5 MG (2.5MG/ML)
1.3000 mg/m2 | Freq: Once | INTRAMUSCULAR | Status: AC
  Administered 2021-12-13: 2 mg via SUBCUTANEOUS
  Filled 2021-12-13: qty 0.8

## 2021-12-18 ENCOUNTER — Encounter: Payer: Self-pay | Admitting: Hematology and Oncology

## 2021-12-18 MED ORDER — HYDROCODONE-ACETAMINOPHEN 5-325 MG PO TABS: 1.0000 | ORAL_TABLET | Freq: Four times a day (QID) | ORAL | 0 refills | Status: DC | PRN

## 2021-12-19 ENCOUNTER — Other Ambulatory Visit: Payer: Self-pay | Admitting: Physician Assistant

## 2021-12-19 DIAGNOSIS — C9000 Multiple myeloma not having achieved remission: Secondary | ICD-10-CM

## 2021-12-20 ENCOUNTER — Encounter: Payer: Self-pay | Admitting: Internal Medicine

## 2021-12-20 ENCOUNTER — Other Ambulatory Visit: Payer: Self-pay

## 2021-12-20 ENCOUNTER — Inpatient Hospital Stay: Payer: Medicare PPO

## 2021-12-20 ENCOUNTER — Inpatient Hospital Stay (HOSPITAL_BASED_OUTPATIENT_CLINIC_OR_DEPARTMENT_OTHER): Payer: Medicare PPO | Admitting: Physician Assistant

## 2021-12-20 ENCOUNTER — Ambulatory Visit: Payer: Medicare PPO | Admitting: Internal Medicine

## 2021-12-20 VITALS — BP 116/62 | HR 69 | Temp 98.3°F | Ht 61.0 in | Wt 115.8 lb

## 2021-12-20 VITALS — BP 128/50 | HR 70 | Temp 97.9°F | Resp 19 | Ht 61.0 in | Wt 117.0 lb

## 2021-12-20 DIAGNOSIS — C9000 Multiple myeloma not having achieved remission: Secondary | ICD-10-CM

## 2021-12-20 DIAGNOSIS — D869 Sarcoidosis, unspecified: Secondary | ICD-10-CM | POA: Diagnosis not present

## 2021-12-20 DIAGNOSIS — Z5112 Encounter for antineoplastic immunotherapy: Secondary | ICD-10-CM | POA: Diagnosis not present

## 2021-12-20 DIAGNOSIS — J9819 Other pulmonary collapse: Secondary | ICD-10-CM | POA: Diagnosis not present

## 2021-12-20 DIAGNOSIS — S22060A Wedge compression fracture of T7-T8 vertebra, initial encounter for closed fracture: Secondary | ICD-10-CM

## 2021-12-20 DIAGNOSIS — Z800 Family history of malignant neoplasm of digestive organs: Secondary | ICD-10-CM | POA: Diagnosis not present

## 2021-12-20 LAB — CBC WITH DIFFERENTIAL (CANCER CENTER ONLY)
Abs Immature Granulocytes: 0.01 10*3/uL (ref 0.00–0.07)
Basophils Absolute: 0 10*3/uL (ref 0.0–0.1)
Basophils Relative: 0 %
Eosinophils Absolute: 0.1 10*3/uL (ref 0.0–0.5)
Eosinophils Relative: 2 %
HCT: 25.4 % — ABNORMAL LOW (ref 36.0–46.0)
Hemoglobin: 8.7 g/dL — ABNORMAL LOW (ref 12.0–15.0)
Immature Granulocytes: 0 %
Lymphocytes Relative: 16 %
Lymphs Abs: 0.7 10*3/uL (ref 0.7–4.0)
MCH: 34.8 pg — ABNORMAL HIGH (ref 26.0–34.0)
MCHC: 34.3 g/dL (ref 30.0–36.0)
MCV: 101.6 fL — ABNORMAL HIGH (ref 80.0–100.0)
Monocytes Absolute: 0.5 10*3/uL (ref 0.1–1.0)
Monocytes Relative: 13 %
Neutro Abs: 2.9 10*3/uL (ref 1.7–7.7)
Neutrophils Relative %: 69 %
Platelet Count: 206 10*3/uL (ref 150–400)
RBC: 2.5 MIL/uL — ABNORMAL LOW (ref 3.87–5.11)
RDW: 13.9 % (ref 11.5–15.5)
WBC Count: 4.1 10*3/uL (ref 4.0–10.5)
nRBC: 0 % (ref 0.0–0.2)

## 2021-12-20 LAB — CMP (CANCER CENTER ONLY)
ALT: 8 U/L (ref 0–44)
AST: 20 U/L (ref 15–41)
Albumin: 3.9 g/dL (ref 3.5–5.0)
Alkaline Phosphatase: 127 U/L — ABNORMAL HIGH (ref 38–126)
Anion gap: 8 (ref 5–15)
BUN: 20 mg/dL (ref 8–23)
CO2: 21 mmol/L — ABNORMAL LOW (ref 22–32)
Calcium: 8.7 mg/dL — ABNORMAL LOW (ref 8.9–10.3)
Chloride: 105 mmol/L (ref 98–111)
Creatinine: 0.9 mg/dL (ref 0.44–1.00)
GFR, Estimated: >60 mL/min (ref >60)
Glucose, Bld: 137 mg/dL — ABNORMAL HIGH (ref 70–99)
Potassium: 4.2 mmol/L (ref 3.5–5.1)
Sodium: 134 mmol/L — ABNORMAL LOW (ref 135–145)
Total Bilirubin: 0.4 mg/dL (ref 0.3–1.2)
Total Protein: 7.2 g/dL (ref 6.5–8.1)

## 2021-12-20 LAB — LACTATE DEHYDROGENASE: LDH: 149 U/L (ref 98–192)

## 2021-12-20 MED ORDER — DICYCLOMINE HCL 10 MG PO CAPS
10.0000 mg | ORAL_CAPSULE | Freq: Three times a day (TID) | ORAL | 0 refills | Status: DC | PRN
Start: 2021-12-20 — End: 2022-01-28

## 2021-12-20 MED ORDER — DEXAMETHASONE 4 MG PO TABS
20.0000 mg | ORAL_TABLET | Freq: Once | ORAL | Status: AC
  Administered 2021-12-20: 20 mg via ORAL

## 2021-12-20 MED ORDER — BORTEZOMIB CHEMO SQ INJECTION 3.5 MG (2.5MG/ML)
1.3000 mg/m2 | Freq: Once | INTRAMUSCULAR | Status: AC
  Administered 2021-12-20: 2 mg via SUBCUTANEOUS
  Filled 2021-12-20: qty 0.8

## 2021-12-20 MED ORDER — PREDNISONE 1 MG PO TABS: ORAL_TABLET | ORAL | 0 refills | Status: DC

## 2021-12-20 NOTE — Progress Notes (Signed)
? ? ?OV 08/24/2014 ? ?Chief Complaint  ?Patient presents with  ? Follow-up  ?  Pt stated the gabapentin has not changed her breathing or cough. Pt c/o prod cough with white mucus. Pt denies SOB and CP/tightness.   ? 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 Main symptom is severe chronic cough with high RSI score suggestive of irritable larynx syndrome that is associated as well ? ?She always finds prednisone to help the cough but due to age and side effects despite good functional status she opts to take a low-dose of prednisone. Currently she is on 5 mg prednisone. In November 2015 due to RSI score of 27 for cough but decided to trial gabapentin to tackle the neurogenic component of cough. She reports now that the gabapentin has not helped her at all although objectively the RSI cough score has drop to 20. She is very frustrated. She and her husband are reluctant to have right middle lobectomy given her advanced age despite good functional status. There are no open to trying methotrexate to tackle the inflammation of sarcoidosis and as a steroid sparing agent. She also wants to come off gabapentin ? ? ?OV 10/02/2014 ? ?Chief Complaint  ?Patient presents with  ? Follow-up  ?  Pt states her breathing is doing well. Pt states her SOB has improved since last OV. Pt c/o mild cough. Pt denies CP/tightness. Pt stated being on methotrexate has significantly helped.   ? ? 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 Main symptom is severe chronic cough with high RSI score suggestive of irritable larynx syndrome that is associated as well ? ? ? ?Last visit in January 2016 she told me that gabapentin did not help her cough. So we stopped this. We started methotrexate for inflammation in her airway due to  sarcoid causing right middle lobe collapse. We left her on prednisone 5 mg per day and started her on methotrexate  5 mg once a week. Also gave Hycodan cough syrup. She says that the Hycodan cough syrup did not help However, 2 weeks after starting her methotrexate she started noticing dramatic improvement in her cough. She hardly coughs now. She has significant improvement in her quality of life and she is extremely happy.  RSI cough score had dropped from 25 to 20 with gabapentin has now dropped to 5.  She is asking questions about how long to take methotrexate. And also review of the side effect profile. Of note I did notice that she is on ACE inhibitor that can potentially provoke all make her cough worse. She is not taking gabapentin anymore. There is no fever or chills. At this point in time she's not interested in Bactrim PCP prophylaxis which should be okay given low-dose of methotrexate and prednisone ? ?Past, Family, Social reviewed: no change since last visit ? ? ? ?OV 01/02/2015 ? ?Chief Complaint  ?Patient presents with  ? Follow-up  ?  Pt recently went to opthamologist and pts vision has decreased since last OV. Pt stated her breathing is unchanged since last OV. Pt c/o mild dry cough. Pt denies CP/tightness.   ? ? 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 Main symptom is severe chronic cough with high RSI score suggestive of irritable larynx syndrome that is associated as well ? ?Cough /Saarcoid / IRritiable LArynx/ RML syundrome - cough persists but is only mild and baseline. No  problems. Does not feel it impacts qualkity of life. She is off ace inhibitor now  ; forgot to mention this during med reconciliation. No asociated wheeze or dyspnea. Maintained on mehtotrxate '5mg'$  weekly, folic acid and prednisone '5mg'$  daily. No evidence of infection. Last LFT was 3 months ago and normal. Hycodan helping cough and wants refill ? ?NEw issue - her eye doc Dr Ellie Lunch called yesterday. PAtient has no complaints of vision but post capsulootmy few week ago and now yesterday  visual field deficit on Rt side is worse. Concern is ocular sarcoid is worse. They are referring her to [place]. PAtient does not want step up Rx of sarcoid till all this is sorted out ? ?New issue - noticed some bruising in left forearm yesterday sponatenous x 2 spots. New. Asytmptomatcic and last 1 weeks some punctate red lesion in distal 1/3 of leg. Associatd with gardening but denies poison ivy. Unchanged since insidious onset. No associatd fever.  ? ? ?OV 04/09/2015 ? ?Chief Complaint  ?Patient presents with  ? Follow-up  ?  Pt denies cough, SOB, CP/tightness. Pt states her breathing is doing well. Pt states she had a recent rash on BIL lower extremities and saw derm and was given a topical ointment that helped. Per pt the derm thought the rash was not related to sarcoid.   ? ? 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 Main symptom is severe chronic cough with high RSI score suggestive of irritable larynx syndrome that is associated as well ? ? ?Cough /Saarcoid / IRritiable LArynx/ RML syundrome - cough is significantly improved. RSI cough score is only 14. She feels that the cough is at a stage where it is completely fine and she can handle things. She is maintained on methotrexate 5 mg weekly, folic acid and prednisone 5 mg daily. She did have some lowish white count after last visit and anemia but we held to the methotrexate and it seems to be stable. She also had a rash at the time of last visit she has seen dermatology and apparently the biopsies do not show this is due to sarcoid. I personally review this outside chart and confirmed that. Of note her last chest x-ray was November 2015 which showed continued right middle lobe collapse. The cough is controlled with the methotrexate and prednisone. She is no longer on ACE inhibitor even though our MAR reports that Also noted that she reports some mild weight gain with prednisone but she says she will try  dietary measures to shake this weight off. She does not want to cough to come and therefore she is content continue with the methotrexate and prednisone. In terms of her vision she is [facility] in October 2016. ? ? ? ?OV 07/16/2015 ? ?Chief Complaint  ?Patient presents with  ? Follow-up  ?  Pt states her breathing is unchanged but her cough has worsened. Pt states her SOB is at her baseline. Pt states her cough is nonproductive and worse in the evening. Pt denies CP/tightness.   ? ? 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 Main symptom is severe chronic cough with high RSI score suggestive of irritable larynx syndrome that is associated as well ? ? ?Cough /Saarcoid / IRritiable LArynx/ RML syundrome -she now reports that her cough is worse for the last few months especially since last visit. RSI cough score is 18 and subjectively she  feels that cough is worse by 25%. Otherwise she is okay. Cough is associated with worsening postnasal drip. She is also asking for refills on a cough syrup methotrexate folic acid. She prefers to have 90 day refills. Despite cough worsened she is not too keen on gabapentin therapy and wants to try other simpler measures at this point in time. There is no fever or sputum production that is associated with the cough. ? 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 No active follow-up. ? ? ?OV 10/18/2015 ? ? ?Chief Complaint  ?Patient presents with  ? Follow-up  ?  Pt states her cough has significantly improved. Pt denies SOB and CP/tightness. Pt states overall she feels she is doing well.   ? ? ?Follow-up chronic cough associated with right middle lobe syndrome secondary to sarcoidosis and irritable larynx syndrome ? ?Last visit December 2016. At the time cough that deteriorated. The significant amount of  postnasal drip. We instituted nasal steroids and with this cough is significantly improved and back to baseline. RSI cough score is 10 which is around her baseline. She feels immensely better. She is in a good

## 2021-12-20 NOTE — Addendum Note (Signed)
Addended by: Lorretta Harp on: 12/20/2021 10:58 AM ? ? Modules accepted: Orders ? ?

## 2021-12-20 NOTE — Progress Notes (Signed)
?[place] ?[phone]   [phone] ? ?PROGRESS NOTE ? ?Patient Care Team: ?Burnard Bunting, MD as PCP - General (Internal Medicine) ? ?Hematological/Oncological History ?# IgG Lambda Multiple Myeloma ?06/04/2021: WBC 5.5, Hgb 11.1, MCV 101.3, Plt 306 ?07/24/2021: M protein 2.3, IFE shows IgG Lambda monoclonal specificity. Cr 0.9 ?08/07/2021: Establish care with Dr. Lorenso Courier ?09/27/2021: Bmbx showed hypercellular bone marrow with plasma cell neoplasm, increased number of atypical plasma cells averaging 20% of all cells in the aspirate ?10/18/2021: Cycle 1 of Velcade/Dexamethasone ?11/08/2021: Cycle 2 of Velcade/Dexamethasone ?11/29/2021: Cycle 3 of Velcade/Dexamethasone ?12/20/2021: Cycle 4 of Velcade/Dexamethasone ? ?Interval History:  ?Michell Heinrich Jobin 88 y.o. female with medical history significant for newly diagnosed IgG lambda multiple myeloma who presents for a follow up visit. The patient's last visit was on 12/06/2020. In the interim, patient completed Cycle 3 of Velcade/Dexamethasone. She is due for Cycle 4 , Day 1. ? ?On exam today Mrs. Rattan is accompanied by her husband.  She reports that her energy levels continue to improve. She is able to do more routines around the house. Her appetite is improving and weight has stabilized. She denies nausea or vomiting. She reports abdominal cramping for 2-3 days after each treatment. Her pain is well controled with naproxen and tylenol. She takes hydrocodone sparingly. She reports intermittent episodes of diarrhea. She denies easy bruising or signs of bleeding. She denies any neuropathy. She has some peripheral edema.Patient denies any fevers, chills, night sweats, shortness of breath, chest pain or cough.She has no other complaints.  Rest of the 10 point ROS is below ? ?MEDICAL HISTORY:  ?Past Medical History:  ?Diagnosis Date  ? Alopecia 10/28/2016  ? Anginal pain ([place])   ? Arthritis   ? Cervical spondylosis   ? C3-4 AND C4-5  ?  Chest pain 07/28/2008  ? H/O, normal stress nuclear EF 78%  ? Chronic cough 07/12/2014  ? Collapsed lung 07/2010  ? being treated at [facility] - in left lung  ? Complication of anesthesia   ? Cough 07/30/2010  ? Qualifier: Diagnosis of  By: Chase Caller MD, Murali    ? Cystocele 05/23/2015  ? Cystocele, midline 05/02/2013  ? DEEP VEIN THROMBOSIS/PHLEBITIS 07/30/2010  ? Qualifier: History of  By: Harvest Dark CMA, [place]    ? Dislocation closed, shoulder 02/2013  ? DVT (deep venous thrombosis) ([place]) 2006  ? Encounter for therapeutic drug monitoring 10/03/2014  ? GERD (gastroesophageal reflux disease)   ? occ  ? Headache   ? hx  ? High cholesterol   ? History of recurrent UTIs 09/24/2011  ? HYPERLIPIDEMIA 07/30/2010  ? Qualifier: Diagnosis of  By: Harvest Dark CMA, [place]    ? Hypertension   ? HYPERTENSION 07/30/2010  ? Qualifier: Diagnosis of  By: Harvest Dark CMA, [place]    ? Hypothyroidism   ? Nasal itching 01/30/2014  ? Osteopenia 05/23/2015  ? Peripheral vascular disease ([place]) 2006  ? dvt's and 50 yrs ago  ? Pleuritic pain 12/17/2015  ? PONV (postoperative nausea and vomiting)   ? Primary localized osteoarthritis of right hip 08/26/2016  ? Primary osteoarthritis of right hip 08/26/2016  ? Pulmonary collapse 07/30/2010  ? Qualifier: Diagnosis of  By: Chase Caller MD, Murali    ? PULMONARY NODULE 07/30/2010  ? Qualifier: Diagnosis of  By: Chase Caller MD, Murali    ? Rash and nonspecific skin eruption 01/02/2015  ? 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 Sarcoidosis   ? Sarcoidosis of lung ([place])   ? Vaginal atrophy 09/24/2011  ?  Visual field defect nasal step 01/02/2015  ? ? ?SURGICAL HISTORY: ?Past Surgical History:  ?Procedure Laterality Date  ? BACK SURGERY  2022  ? EYE SURGERY    ? FOOT SURGERY Left 06/2005  ? bunion hammer toe  ? HEMORRHOID SURGERY  12/1972  ? INTRAOCULAR LENS INSERTION    ? right eye 10 /16 and left 06/16/2005  ? JOINT REPLACEMENT    ? KYPHOPLASTY N/A 06/27/2021  ? Procedure: LUMBAR 3 AND LUMBAR  5 KYPHOPLASTY;   Surgeon: Phylliss Bob, MD;  Location: [place];  Service: Orthopedics;  Laterality: N/A;  ? KYPHOPLASTY N/A 09/04/2021  ? Procedure: THORACIC SEVEN, THORACIC EIGHT, THORACIC NINE, THORACIC TEN KYPHOPLASTY;  Surgeon: Phylliss Bob, MD;  Location: [facility];  Service: Orthopedics;  Laterality: N/A;  ? SKIN BIOPSY  01/2007  ? basal cell carcinoma removed from right lower leg   ? TOTAL HIP ARTHROPLASTY  06/2007  ? left  ? TOTAL HIP ARTHROPLASTY Right 08/26/2016  ? Procedure: TOTAL HIP ARTHROPLASTY ANTERIOR APPROACH;  Surgeon: Melrose Nakayama, MD;  Location: [place];  Service: Orthopedics;  Laterality: Right;  ? TOTAL KNEE ARTHROPLASTY Right 10/2007  ? right  ? ? ?SOCIAL HISTORY: ?Social History  ? ?Socioeconomic History  ? Marital status: Married  ?  Spouse name: Not on file  ? Number of children: Not on file  ? Years of education: Not on file  ? Highest education level: Not on file  ?Occupational History  ? Occupation: retired  ?  Employer: RETIRED  ?Tobacco Use  ? Smoking status: Never  ? Smokeless tobacco: Never  ?Vaping Use  ? Vaping Use: Never used  ?Substance and Sexual Activity  ? Alcohol use: Not Currently  ?  Comment: BEER/glass of wine A DAY  ? Drug use: No  ? Sexual activity: Never  ?Other Topics Concern  ? Not on file  ?Social History Narrative  ? Not on file  ? ?Social Determinants of Health  ? ?Financial Resource Strain: Not on file  ?Food Insecurity: Not on file  ?Transportation Needs: Not on file  ?Physical Activity: Not on file  ?Stress: Not on file  ?Social Connections: Not on file  ?Intimate Partner Violence: Not on file  ? ? ?FAMILY HISTORY: ?Family History  ?Problem Relation Age of Onset  ? Diabetes Mother   ? Heart failure Mother   ? Hypertension Father   ? Diabetes Father   ? Heart disease Father   ? Heart attack Father   ? ? ?ALLERGIES:  has no active allergies. ? ?MEDICATIONS:  ?Current Outpatient Medications  ?Medication Sig Dispense Refill  ? acyclovir (ZOVIRAX) 400 MG tablet Take 1 tablet (400 mg  total) by mouth 2 (two) times daily. 60 tablet 2  ? amLODipine (NORVASC) 10 MG tablet Take 10 mg by mouth daily.    ? azelastine (OPTIVAR) 0.05 % ophthalmic solution Place 1 drop into both eyes 2 (two) times daily.     ? Calcium Citrate-Vitamin D (CALCIUM + D PO) Take 1 tablet by mouth daily.    ? gemfibrozil (LOPID) 600 MG tablet Take 600 mg by mouth 2 (two) times daily before a meal.    ? HYDROcodone-acetaminophen (NORCO/VICODIN) 5-325 MG tablet Take 1 tablet by mouth every 6 (six) hours as needed for moderate pain. 30 tablet 0  ? levothyroxine (SYNTHROID, LEVOTHROID) 25 MCG tablet Take 25 mcg by mouth daily.     ? losartan (COZAAR) 50 MG tablet Take 50 mg by mouth daily.    ?  Multiple Vitamin (MULTIVITAMIN) capsule Take 1 capsule by mouth daily.    ? ondansetron (ZOFRAN) 8 MG tablet Take 1 tablet (8 mg total) by mouth every 8 (eight) hours as needed. 30 tablet 0  ? pantoprazole (PROTONIX) 20 MG tablet Take 20 mg by mouth daily.    ? polyethylene glycol powder (GLYCOLAX/MIRALAX) 17 GM/SCOOP powder Take 17 g by mouth at bedtime.    ? LORazepam (ATIVAN) 0.5 MG tablet Take 0.5 mg by mouth daily as needed for anxiety or sleep. (Patient not taking: Reported on 12/20/2021)    ? predniSONE (DELTASONE) 1 MG tablet Take 0.58m Mon, Wed, Fri for 1 week, then 0.522mMon and Fri for 1 week, then 0.27m51mon for 1 week, then stop (Patient not taking: Reported on 12/20/2021) 6 tablet 0  ? ?No current facility-administered medications for this visit.  ? ? ?REVIEW OF SYSTEMS:   ?Constitutional: ( - ) fevers, ( - )  chills , ( - ) night sweats ?Eyes: ( - ) blurriness of vision, ( - ) double vision, ( - ) watery eyes ?Ears, nose, mouth, throat, and face: ( - ) mucositis, ( - ) sore throat ?Respiratory: ( - ) cough, ( - ) dyspnea, ( - ) wheezes ?Cardiovascular: ( - ) palpitation, ( - ) chest discomfort, ( - ) lower extremity swelling ?Gastrointestinal:  ( - ) nausea, ( - ) heartburn, ( - ) change in bowel habits ?Skin: ( - ) abnormal skin  rashes ?Lymphatics: ( - ) new lymphadenopathy, ( - ) easy bruising ?Neurological: ( - ) numbness, ( - ) tingling, ( - ) new weaknesses ?Behavioral/Psych: ( - ) mood change, ( - ) new changes  ?All othe

## 2021-12-20 NOTE — Patient Instructions (Signed)
[facility]  Discharge Instructions: Thank you for choosing [facility] to provide your oncology and hematology care.   If you have a lab appointment with the Cancer Center, please go directly to the Cancer Center and check in at the registration area.   Wear comfortable clothing and clothing appropriate for easy access to any Portacath or PICC line.   We strive to give you quality time with your provider. You may need to reschedule your appointment if you arrive late (15 or more minutes).  Arriving late affects you and other patients whose appointments are after yours.  Also, if you miss three or more appointments without notifying the office, you may be dismissed from the clinic at the provider's discretion.      For prescription refill requests, have your pharmacy contact our office and allow 72 hours for refills to be completed.    Today you received the following chemotherapy and/or immunotherapy agents Velcade       To help prevent nausea and vomiting after your treatment, we encourage you to take your nausea medication as directed.  BELOW ARE SYMPTOMS THAT SHOULD BE REPORTED IMMEDIATELY: *FEVER GREATER THAN 100.4 F (38 C) OR HIGHER *CHILLS OR SWEATING *NAUSEA AND VOMITING THAT IS NOT CONTROLLED WITH YOUR NAUSEA MEDICATION *UNUSUAL SHORTNESS OF BREATH *UNUSUAL BRUISING OR BLEEDING *URINARY PROBLEMS (pain or burning when urinating, or frequent urination) *BOWEL PROBLEMS (unusual diarrhea, constipation, pain near the anus) TENDERNESS IN MOUTH AND THROAT WITH OR WITHOUT PRESENCE OF ULCERS (sore throat, sores in mouth, or a toothache) UNUSUAL RASH, SWELLING OR PAIN  UNUSUAL VAGINAL DISCHARGE OR ITCHING   Items with * indicate a potential emergency and should be followed up as soon as possible or go to the Emergency Department if any problems should occur.  Please show the CHEMOTHERAPY ALERT CARD or IMMUNOTHERAPY ALERT CARD at check-in to  the Emergency Department and triage nurse.  Should you have questions after your visit or need to cancel or reschedule your appointment, please contact [facility]  Dept: [phone]  and follow the prompts.  Office hours are 8:00 a.m. to 4:30 p.m. Monday - Friday. Please note that voicemails left after 4:00 p.m. may not be returned until the following business day.  We are closed weekends and major holidays. You have access to a nurse at all times for urgent questions. Please call the main number to the clinic Dept: [phone] and follow the prompts.   For any non-urgent questions, you may also contact your provider using MyChart. We now offer e-Visits for anyone 18 and older to request care online for non-urgent symptoms. For details visit [email or web address].   Also download the MyChart app! Go to the app store, search "MyChart", open the app, select [facility], and log in with your MyChart username and password.  Due to Covid, a mask is required upon entering the hospital/clinic. If you do not have a mask, one will be given to you upon arrival. For doctor visits, patients may have 1 support person aged 18 or older with them. For treatment visits, patients cannot have anyone with them due to current Covid guidelines and our immunocompromised population.   

## 2021-12-20 NOTE — Patient Instructions (Addendum)
ICD-10-CM   ?1. Sarcoidosis  D86.9   ?  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  ?3. Closed wedge compression fracture of T7 vertebra, initial encounter ([place])  S22.060A   ?  ?4. Multiple myeloma not having achieved remission ([facility])  C90.00   ?  ? ? ? ? ?Sarcoidosis appears stable and in symptom remission. No sarcoid symptoms. No cough.  CT Chest march 2023 show overall improvement since 2020 thought RML collapse perissts (no visible on CXR though) ? ?Plan ? - Reduce prednisone to 0.18m mg/day  ?  - take MOnday , Wednesday, Friday x 1 weeks ? - then Monday and Friday x 1 week ? - then Monday alone x 1 week ? - then STOP ? ? ? ?T-spine wedge fracture - T3, T4, T7, T8 sever, T9 severe, T10 moderate  ?Opioid requiring back pain with reduced mobility ?Myleoma recent history ? ? ? ?Plan ?- Per oncology, and PCP ? ? ? ?Follow-up ?12 weeks with RChase Caller- can be video visit if you like; 15 min ?

## 2021-12-22 ENCOUNTER — Encounter: Payer: Self-pay | Admitting: Hematology and Oncology

## 2021-12-23 LAB — KAPPA/LAMBDA LIGHT CHAINS
Kappa free light chain: 14.3 mg/L (ref 3.3–19.4)
Kappa, lambda light chain ratio: 1.63 (ref 0.26–1.65)
Lambda free light chains: 8.8 mg/L (ref 5.7–26.3)

## 2021-12-24 DIAGNOSIS — S51812A Laceration without foreign body of left forearm, initial encounter: Secondary | ICD-10-CM | POA: Diagnosis not present

## 2021-12-25 LAB — MULTIPLE MYELOMA PANEL, SERUM
Albumin SerPl Elph-Mcnc: 3.6 g/dL (ref 2.9–4.4)
Albumin/Glob SerPl: 1.2 (ref 0.7–1.7)
Alpha 1: 0.3 g/dL (ref 0.0–0.4)
Alpha2 Glob SerPl Elph-Mcnc: 0.8 g/dL (ref 0.4–1.0)
B-Globulin SerPl Elph-Mcnc: 1.8 g/dL — ABNORMAL HIGH (ref 0.7–1.3)
Gamma Glob SerPl Elph-Mcnc: 0.2 g/dL — ABNORMAL LOW (ref 0.4–1.8)
Globulin, Total: 3.2 g/dL (ref 2.2–3.9)
IgA: 10 mg/dL — ABNORMAL LOW (ref 64–422)
IgG (Immunoglobin G), Serum: 955 mg/dL (ref 586–1602)
IgM (Immunoglobulin M), Srm: 16 mg/dL — ABNORMAL LOW (ref 26–217)
M Protein SerPl Elph-Mcnc: 1 g/dL — ABNORMAL HIGH
Total Protein ELP: 6.8 g/dL (ref 6.0–8.5)

## 2021-12-27 ENCOUNTER — Other Ambulatory Visit: Payer: Self-pay

## 2021-12-27 ENCOUNTER — Inpatient Hospital Stay: Payer: Medicare PPO

## 2021-12-27 ENCOUNTER — Other Ambulatory Visit: Payer: Self-pay | Admitting: Hematology and Oncology

## 2021-12-27 VITALS — BP 129/52 | HR 62 | Temp 97.7°F | Resp 16 | Wt 119.0 lb

## 2021-12-27 DIAGNOSIS — C9000 Multiple myeloma not having achieved remission: Secondary | ICD-10-CM

## 2021-12-27 DIAGNOSIS — Z800 Family history of malignant neoplasm of digestive organs: Secondary | ICD-10-CM | POA: Diagnosis not present

## 2021-12-27 DIAGNOSIS — Z5112 Encounter for antineoplastic immunotherapy: Secondary | ICD-10-CM | POA: Diagnosis not present

## 2021-12-27 LAB — CBC WITH DIFFERENTIAL (CANCER CENTER ONLY)
Abs Immature Granulocytes: 0.02 10*3/uL (ref 0.00–0.07)
Basophils Absolute: 0 10*3/uL (ref 0.0–0.1)
Basophils Relative: 0 %
Eosinophils Absolute: 0.1 10*3/uL (ref 0.0–0.5)
Eosinophils Relative: 3 %
HCT: 24.5 % — ABNORMAL LOW (ref 36.0–46.0)
Hemoglobin: 8.5 g/dL — ABNORMAL LOW (ref 12.0–15.0)
Immature Granulocytes: 1 %
Lymphocytes Relative: 23 %
Lymphs Abs: 1 10*3/uL (ref 0.7–4.0)
MCH: 35.1 pg — ABNORMAL HIGH (ref 26.0–34.0)
MCHC: 34.7 g/dL (ref 30.0–36.0)
MCV: 101.2 fL — ABNORMAL HIGH (ref 80.0–100.0)
Monocytes Absolute: 0.7 10*3/uL (ref 0.1–1.0)
Monocytes Relative: 16 %
Neutro Abs: 2.4 10*3/uL (ref 1.7–7.7)
Neutrophils Relative %: 57 %
Platelet Count: 217 10*3/uL (ref 150–400)
RBC: 2.42 MIL/uL — ABNORMAL LOW (ref 3.87–5.11)
RDW: 13.6 % (ref 11.5–15.5)
WBC Count: 4.3 10*3/uL (ref 4.0–10.5)
nRBC: 0 % (ref 0.0–0.2)

## 2021-12-27 LAB — CMP (CANCER CENTER ONLY)
ALT: 10 U/L (ref 0–44)
AST: 20 U/L (ref 15–41)
Albumin: 3.5 g/dL (ref 3.5–5.0)
Alkaline Phosphatase: 110 U/L (ref 38–126)
Anion gap: 7 (ref 5–15)
BUN: 18 mg/dL (ref 8–23)
CO2: 21 mmol/L — ABNORMAL LOW (ref 22–32)
Calcium: 9 mg/dL (ref 8.9–10.3)
Chloride: 107 mmol/L (ref 98–111)
Creatinine: 0.94 mg/dL (ref 0.44–1.00)
GFR, Estimated: 58 mL/min — ABNORMAL LOW (ref >60)
Glucose, Bld: 113 mg/dL — ABNORMAL HIGH (ref 70–99)
Potassium: 5.1 mmol/L (ref 3.5–5.1)
Sodium: 135 mmol/L (ref 135–145)
Total Bilirubin: 0.5 mg/dL (ref 0.3–1.2)
Total Protein: 7 g/dL (ref 6.5–8.1)

## 2021-12-27 LAB — LACTATE DEHYDROGENASE: LDH: 132 U/L (ref 98–192)

## 2021-12-27 MED ORDER — BORTEZOMIB CHEMO SQ INJECTION 3.5 MG (2.5MG/ML)
1.3000 mg/m2 | Freq: Once | INTRAMUSCULAR | Status: AC
  Administered 2021-12-27: 2 mg via SUBCUTANEOUS
  Filled 2021-12-27: qty 0.8

## 2021-12-27 MED ORDER — DEXAMETHASONE 4 MG PO TABS
20.0000 mg | ORAL_TABLET | Freq: Once | ORAL | Status: AC
  Administered 2021-12-27: 20 mg via ORAL
  Filled 2021-12-27: qty 5

## 2021-12-27 NOTE — Patient Instructions (Signed)
[facility]  Discharge Instructions: Thank you for choosing [facility] to provide your oncology and hematology care.   If you have a lab appointment with the Cancer Center, please go directly to the Cancer Center and check in at the registration area.   Wear comfortable clothing and clothing appropriate for easy access to any Portacath or PICC line.   We strive to give you quality time with your provider. You may need to reschedule your appointment if you arrive late (15 or more minutes).  Arriving late affects you and other patients whose appointments are after yours.  Also, if you miss three or more appointments without notifying the office, you may be dismissed from the clinic at the provider's discretion.      For prescription refill requests, have your pharmacy contact our office and allow 72 hours for refills to be completed.    Today you received the following chemotherapy and/or immunotherapy agents Velcade       To help prevent nausea and vomiting after your treatment, we encourage you to take your nausea medication as directed.  BELOW ARE SYMPTOMS THAT SHOULD BE REPORTED IMMEDIATELY: *FEVER GREATER THAN 100.4 F (38 C) OR HIGHER *CHILLS OR SWEATING *NAUSEA AND VOMITING THAT IS NOT CONTROLLED WITH YOUR NAUSEA MEDICATION *UNUSUAL SHORTNESS OF BREATH *UNUSUAL BRUISING OR BLEEDING *URINARY PROBLEMS (pain or burning when urinating, or frequent urination) *BOWEL PROBLEMS (unusual diarrhea, constipation, pain near the anus) TENDERNESS IN MOUTH AND THROAT WITH OR WITHOUT PRESENCE OF ULCERS (sore throat, sores in mouth, or a toothache) UNUSUAL RASH, SWELLING OR PAIN  UNUSUAL VAGINAL DISCHARGE OR ITCHING   Items with * indicate a potential emergency and should be followed up as soon as possible or go to the Emergency Department if any problems should occur.  Please show the CHEMOTHERAPY ALERT CARD or IMMUNOTHERAPY ALERT CARD at check-in to  the Emergency Department and triage nurse.  Should you have questions after your visit or need to cancel or reschedule your appointment, please contact [facility]  Dept: [phone]  and follow the prompts.  Office hours are 8:00 a.m. to 4:30 p.m. Monday - Friday. Please note that voicemails left after 4:00 p.m. may not be returned until the following business day.  We are closed weekends and major holidays. You have access to a nurse at all times for urgent questions. Please call the main number to the clinic Dept: [phone] and follow the prompts.   For any non-urgent questions, you may also contact your provider using MyChart. We now offer e-Visits for anyone 18 and older to request care online for non-urgent symptoms. For details visit [email or web address].   Also download the MyChart app! Go to the app store, search "MyChart", open the app, select [facility], and log in with your MyChart username and password.  Due to Covid, a mask is required upon entering the hospital/clinic. If you do not have a mask, one will be given to you upon arrival. For doctor visits, patients may have 1 support person aged 18 or older with them. For treatment visits, patients cannot have anyone with them due to current Covid guidelines and our immunocompromised population.   

## 2022-01-01 ENCOUNTER — Telehealth: Payer: Self-pay | Admitting: *Deleted

## 2022-01-01 ENCOUNTER — Other Ambulatory Visit: Payer: Self-pay | Admitting: Hematology and Oncology

## 2022-01-01 MED ORDER — TRAMADOL HCL 50 MG PO TABS: 50.0000 mg | ORAL_TABLET | Freq: Four times a day (QID) | ORAL | 0 refills | Status: DC | PRN

## 2022-01-01 NOTE — Telephone Encounter (Signed)
Received call from pt's husband, requesting a new pain medication. He states the oxycodone is too strong for his wife. He would like to try Tramadol for a while and see how that helps her. Advised that I would pass this information on to Dr. Lorenso Courier.

## 2022-01-02 ENCOUNTER — Telehealth: Payer: Self-pay | Admitting: *Deleted

## 2022-01-02 NOTE — Telephone Encounter (Signed)
TCT patient's husband, Shaine Mount. Regarding prescription for Tramadol. Spoke with him and advised that Dr. Lorenso Courier has sent in a prescription for Tramadol. He states he has picked it up already. Advised that he can start with a half tablet if he wants to-to see how she tolerates it.  He said he would try that.  He states he will let me know how she does with this. No other questions or concerns. He is aware of her next appts here.

## 2022-01-03 ENCOUNTER — Inpatient Hospital Stay: Payer: Medicare PPO

## 2022-01-03 ENCOUNTER — Other Ambulatory Visit: Payer: Self-pay

## 2022-01-03 ENCOUNTER — Inpatient Hospital Stay (HOSPITAL_BASED_OUTPATIENT_CLINIC_OR_DEPARTMENT_OTHER): Payer: Medicare PPO | Admitting: Hematology and Oncology

## 2022-01-03 ENCOUNTER — Other Ambulatory Visit: Payer: Self-pay | Admitting: Hematology and Oncology

## 2022-01-03 VITALS — BP 121/56 | HR 73 | Temp 97.8°F | Resp 17 | Wt 117.8 lb

## 2022-01-03 DIAGNOSIS — Z800 Family history of malignant neoplasm of digestive organs: Secondary | ICD-10-CM | POA: Diagnosis not present

## 2022-01-03 DIAGNOSIS — D649 Anemia, unspecified: Secondary | ICD-10-CM

## 2022-01-03 DIAGNOSIS — C9000 Multiple myeloma not having achieved remission: Secondary | ICD-10-CM

## 2022-01-03 DIAGNOSIS — E538 Deficiency of other specified B group vitamins: Secondary | ICD-10-CM

## 2022-01-03 DIAGNOSIS — Z5112 Encounter for antineoplastic immunotherapy: Secondary | ICD-10-CM | POA: Diagnosis not present

## 2022-01-03 LAB — LACTATE DEHYDROGENASE: LDH: 152 U/L (ref 98–192)

## 2022-01-03 LAB — CMP (CANCER CENTER ONLY)
ALT: 7 U/L (ref 0–44)
AST: 18 U/L (ref 15–41)
Albumin: 3.9 g/dL (ref 3.5–5.0)
Alkaline Phosphatase: 111 U/L (ref 38–126)
Anion gap: 7 (ref 5–15)
BUN: 13 mg/dL (ref 8–23)
CO2: 22 mmol/L (ref 22–32)
Calcium: 9 mg/dL (ref 8.9–10.3)
Chloride: 105 mmol/L (ref 98–111)
Creatinine: 0.74 mg/dL (ref 0.44–1.00)
GFR, Estimated: >60 mL/min (ref >60)
Glucose, Bld: 128 mg/dL — ABNORMAL HIGH (ref 70–99)
Potassium: 3.9 mmol/L (ref 3.5–5.1)
Sodium: 134 mmol/L — ABNORMAL LOW (ref 135–145)
Total Bilirubin: 0.4 mg/dL (ref 0.3–1.2)
Total Protein: 6.9 g/dL (ref 6.5–8.1)

## 2022-01-03 LAB — CBC WITH DIFFERENTIAL (CANCER CENTER ONLY)
Abs Immature Granulocytes: 0.02 10*3/uL (ref 0.00–0.07)
Basophils Absolute: 0 10*3/uL (ref 0.0–0.1)
Basophils Relative: 0 %
Eosinophils Absolute: 0.1 10*3/uL (ref 0.0–0.5)
Eosinophils Relative: 1 %
HCT: 25.2 % — ABNORMAL LOW (ref 36.0–46.0)
Hemoglobin: 8.8 g/dL — ABNORMAL LOW (ref 12.0–15.0)
Immature Granulocytes: 0 %
Lymphocytes Relative: 21 %
Lymphs Abs: 1 10*3/uL (ref 0.7–4.0)
MCH: 35.2 pg — ABNORMAL HIGH (ref 26.0–34.0)
MCHC: 34.9 g/dL (ref 30.0–36.0)
MCV: 100.8 fL — ABNORMAL HIGH (ref 80.0–100.0)
Monocytes Absolute: 0.6 10*3/uL (ref 0.1–1.0)
Monocytes Relative: 12 %
Neutro Abs: 3.2 10*3/uL (ref 1.7–7.7)
Neutrophils Relative %: 66 %
Platelet Count: 236 10*3/uL (ref 150–400)
RBC: 2.5 MIL/uL — ABNORMAL LOW (ref 3.87–5.11)
RDW: 13.5 % (ref 11.5–15.5)
WBC Count: 4.9 10*3/uL (ref 4.0–10.5)
nRBC: 0 % (ref 0.0–0.2)

## 2022-01-03 MED ORDER — BORTEZOMIB CHEMO SQ INJECTION 3.5 MG (2.5MG/ML)
1.3000 mg/m2 | Freq: Once | INTRAMUSCULAR | Status: AC
  Administered 2022-01-03: 2 mg via SUBCUTANEOUS
  Filled 2022-01-03: qty 0.8

## 2022-01-03 MED ORDER — DEXAMETHASONE 4 MG PO TABS
20.0000 mg | ORAL_TABLET | Freq: Once | ORAL | Status: AC
  Administered 2022-01-03: 20 mg via ORAL
  Filled 2022-01-03: qty 5

## 2022-01-03 NOTE — Patient Instructions (Signed)
[facility]  Discharge Instructions: Thank you for choosing [facility] to provide your oncology and hematology care.   If you have a lab appointment with the Cancer Center, please go directly to the Cancer Center and check in at the registration area.   Wear comfortable clothing and clothing appropriate for easy access to any Portacath or PICC line.   We strive to give you quality time with your provider. You may need to reschedule your appointment if you arrive late (15 or more minutes).  Arriving late affects you and other patients whose appointments are after yours.  Also, if you miss three or more appointments without notifying the office, you may be dismissed from the clinic at the provider's discretion.      For prescription refill requests, have your pharmacy contact our office and allow 72 hours for refills to be completed.    Today you received the following chemotherapy and/or immunotherapy agents Velcade       To help prevent nausea and vomiting after your treatment, we encourage you to take your nausea medication as directed.  BELOW ARE SYMPTOMS THAT SHOULD BE REPORTED IMMEDIATELY: *FEVER GREATER THAN 100.4 F (38 C) OR HIGHER *CHILLS OR SWEATING *NAUSEA AND VOMITING THAT IS NOT CONTROLLED WITH YOUR NAUSEA MEDICATION *UNUSUAL SHORTNESS OF BREATH *UNUSUAL BRUISING OR BLEEDING *URINARY PROBLEMS (pain or burning when urinating, or frequent urination) *BOWEL PROBLEMS (unusual diarrhea, constipation, pain near the anus) TENDERNESS IN MOUTH AND THROAT WITH OR WITHOUT PRESENCE OF ULCERS (sore throat, sores in mouth, or a toothache) UNUSUAL RASH, SWELLING OR PAIN  UNUSUAL VAGINAL DISCHARGE OR ITCHING   Items with * indicate a potential emergency and should be followed up as soon as possible or go to the Emergency Department if any problems should occur.  Please show the CHEMOTHERAPY ALERT CARD or IMMUNOTHERAPY ALERT CARD at check-in to  the Emergency Department and triage nurse.  Should you have questions after your visit or need to cancel or reschedule your appointment, please contact [facility]  Dept: [phone]  and follow the prompts.  Office hours are 8:00 a.m. to 4:30 p.m. Monday - Friday. Please note that voicemails left after 4:00 p.m. may not be returned until the following business day.  We are closed weekends and major holidays. You have access to a nurse at all times for urgent questions. Please call the main number to the clinic Dept: [phone] and follow the prompts.   For any non-urgent questions, you may also contact your provider using MyChart. We now offer e-Visits for anyone 18 and older to request care online for non-urgent symptoms. For details visit [email or web address].   Also download the MyChart app! Go to the app store, search "MyChart", open the app, select [facility], and log in with your MyChart username and password.  Due to Covid, a mask is required upon entering the hospital/clinic. If you do not have a mask, one will be given to you upon arrival. For doctor visits, patients may have 1 support person aged 18 or older with them. For treatment visits, patients cannot have anyone with them due to current Covid guidelines and our immunocompromised population.   

## 2022-01-04 LAB — BETA 2 MICROGLOBULIN, SERUM: Beta-2 Microglobulin: 2.6 mg/L — ABNORMAL HIGH (ref 0.6–2.4)

## 2022-01-07 ENCOUNTER — Encounter: Payer: Self-pay | Admitting: Hematology and Oncology

## 2022-01-07 LAB — MULTIPLE MYELOMA PANEL, SERUM
Albumin SerPl Elph-Mcnc: 3.4 g/dL (ref 2.9–4.4)
Albumin/Glob SerPl: 1.1 (ref 0.7–1.7)
Alpha 1: 0.3 g/dL (ref 0.0–0.4)
Alpha2 Glob SerPl Elph-Mcnc: 0.8 g/dL (ref 0.4–1.0)
B-Globulin SerPl Elph-Mcnc: 1.7 g/dL — ABNORMAL HIGH (ref 0.7–1.3)
Gamma Glob SerPl Elph-Mcnc: 0.3 g/dL — ABNORMAL LOW (ref 0.4–1.8)
Globulin, Total: 3.1 g/dL (ref 2.2–3.9)
IgA: 12 mg/dL — ABNORMAL LOW (ref 64–422)
IgG (Immunoglobin G), Serum: 877 mg/dL (ref 586–1602)
IgM (Immunoglobulin M), Srm: 16 mg/dL — ABNORMAL LOW (ref 26–217)
M Protein SerPl Elph-Mcnc: 0.8 g/dL — ABNORMAL HIGH
Total Protein ELP: 6.5 g/dL (ref 6.0–8.5)

## 2022-01-07 LAB — KAPPA/LAMBDA LIGHT CHAINS
Kappa free light chain: 18 mg/L (ref 3.3–19.4)
Kappa, lambda light chain ratio: 1.65 (ref 0.26–1.65)
Lambda free light chains: 10.9 mg/L (ref 5.7–26.3)

## 2022-01-07 NOTE — Progress Notes (Addendum)
[name] [phone]   [phone]  PROGRESS NOTE  Patient Care Team: Burnard Bunting, MD as PCP - General (Internal Medicine)  Hematological/Oncological History # IgG Lambda Multiple Myeloma 06/04/2021: WBC 5.5, Hgb 11.1, MCV 101.3, Plt 306 07/24/2021: M protein 2.3, IFE shows IgG Lambda monoclonal specificity. Cr 0.9 08/07/2021: Establish care with Dr. Lorenso Courier 09/27/2021: Bmbx showed hypercellular bone marrow with plasma cell neoplasm, increased number of atypical plasma cells averaging 20% of all cells in the aspirate 10/18/2021: Cycle 1 of Velcade/Dexamethasone 11/08/2021: Cycle 2 of Velcade/Dexamethasone 11/29/2021: Cycle 3 of Velcade/Dexamethasone 12/20/2021: Cycle 4 of Velcade/Dexamethasone  Interval History:  [name] Page 88 y.o. female with medical history significant for newly diagnosed IgG lambda multiple myeloma who presents for a follow up visit. The patient's last visit was on 12/20/2020. In the interim, patient has had no major changes in her health. She is due for Cycle 4 , Day 15.  On exam today [name] Page is accompanied by her husband.  She reports she is having some swelling in her legs and feet but otherwise she is doing quite well.  Her current pain regimen currently has her pain under good control.  She reports that she would like to move back to tramadol as the hydrocodone may have been a little bit too potent.  She is not having any numbness or tingling of her fingers or toes.Patient denies any fevers, chills, night sweats, shortness of breath, chest pain or cough.She has no other complaints.  Rest of the 10 point ROS is below  MEDICAL HISTORY:  Past Medical History:  Diagnosis Date   Alopecia 10/28/2016   Anginal pain (HCC)    Arthritis    Cervical spondylosis    C3-4 AND C4-5   Chest pain 07/28/2008   H/O, normal stress nuclear EF 78%   Chronic cough 07/12/2014   Collapsed lung 07/2010   being treated at [facility] - in  left lung   Complication of anesthesia    Cough 07/30/2010   Qualifier: Diagnosis of  By: Chase Caller MD, Murali     Cystocele 05/23/2015   Cystocele, midline 05/02/2013   DEEP VEIN THROMBOSIS/PHLEBITIS 07/30/2010   Qualifier: History of  By: Harvest Dark CMA, Jennifer     Dislocation closed, shoulder 02/2013   DVT (deep venous thrombosis) ([place]) 2006   Encounter for therapeutic drug monitoring 10/03/2014   GERD (gastroesophageal reflux disease)    occ   Headache    hx   High cholesterol    History of recurrent UTIs 09/24/2011   HYPERLIPIDEMIA 07/30/2010   Qualifier: Diagnosis of  By: Harvest Dark CMA, Jennifer     Hypertension    HYPERTENSION 07/30/2010   Qualifier: Diagnosis of  By: Harvest Dark CMA, Jennifer     Hypothyroidism    Nasal itching 01/30/2014   Osteopenia 05/23/2015   Peripheral vascular disease ([place]) 2006   dvt's and 50 yrs ago   Pleuritic pain 12/17/2015   PONV (postoperative nausea and vomiting)    Primary localized osteoarthritis of right hip 08/26/2016   Primary osteoarthritis of right hip 08/26/2016   Pulmonary collapse 07/30/2010   Qualifier: Diagnosis of  By: Chase Caller MD, Murali     PULMONARY NODULE 07/30/2010   Qualifier: Diagnosis of  By: Chase Caller MD, Murali     Rash and nonspecific skin eruption 01/02/2015   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   Sarcoidosis    Sarcoidosis of lung ([place])    Vaginal atrophy 09/24/2011   Visual field defect nasal step  01/02/2015    SURGICAL HISTORY: Past Surgical History:  Procedure Laterality Date   BACK SURGERY  2022   EYE SURGERY     FOOT SURGERY Left 06/2005   bunion hammer toe   HEMORRHOID SURGERY  12/1972   INTRAOCULAR LENS INSERTION     right eye 10 /16 and left 06/16/2005   JOINT REPLACEMENT     KYPHOPLASTY N/A 06/27/2021   Procedure: LUMBAR 3 AND LUMBAR  5 KYPHOPLASTY;  Surgeon: Phylliss Bob, MD;  Location: [place];  Service: Orthopedics;  Laterality: N/A;   KYPHOPLASTY N/A 09/04/2021   Procedure: THORACIC  SEVEN, THORACIC EIGHT, THORACIC NINE, THORACIC TEN KYPHOPLASTY;  Surgeon: Phylliss Bob, MD;  Location: [place];  Service: Orthopedics;  Laterality: N/A;   SKIN BIOPSY  01/2007   basal cell carcinoma removed from right lower leg    TOTAL HIP ARTHROPLASTY  06/2007   left   TOTAL HIP ARTHROPLASTY Right 08/26/2016   Procedure: TOTAL HIP ARTHROPLASTY ANTERIOR APPROACH;  Surgeon: Melrose Nakayama, MD;  Location: [place];  Service: Orthopedics;  Laterality: Right;   TOTAL KNEE ARTHROPLASTY Right 10/2007   right    SOCIAL HISTORY: Social History   Socioeconomic History   Marital status: Married    Spouse name: Not on file   Number of children: Not on file   Years of education: Not on file   Highest education level: Not on file  Occupational History   Occupation: retired    Fish farm manager: RETIRED  Tobacco Use   Smoking status: Never   Smokeless tobacco: Never  Vaping Use   Vaping Use: Never used  Substance and Sexual Activity   Alcohol use: Not Currently    Comment: BEER/glass of wine A DAY   Drug use: No   Sexual activity: Never  Other Topics Concern   Not on file  Social History Narrative   Not on file   Social Determinants of Health   Financial Resource Strain: Not on file  Food Insecurity: Not on file  Transportation Needs: Not on file  Physical Activity: Not on file  Stress: Not on file  Social Connections: Not on file  Intimate Partner Violence: Not on file    FAMILY HISTORY: Family History  Problem Relation Age of Onset   Diabetes Mother    Heart failure Mother    Hypertension Father    Diabetes Father    Heart disease Father    Heart attack Father     ALLERGIES:  has no active allergies.  MEDICATIONS:  Current Outpatient Medications  Medication Sig Dispense Refill   acyclovir (ZOVIRAX) 400 MG tablet Take 1 tablet (400 mg total) by mouth 2 (two) times daily. 60 tablet 2   amLODipine (NORVASC) 10 MG tablet Take 10 mg by mouth daily.     Calcium Citrate-Vitamin  D (CALCIUM + D PO) Take 1 tablet by mouth daily.     dicyclomine (BENTYL) 10 MG capsule Take 1 capsule (10 mg total) by mouth 3 (three) times daily as needed for spasms. 90 capsule 0   gemfibrozil (LOPID) 600 MG tablet Take 600 mg by mouth 2 (two) times daily before a meal.     HYDROcodone-acetaminophen (NORCO/VICODIN) 5-325 MG tablet Take 1 tablet by mouth every 6 (six) hours as needed for moderate pain. 30 tablet 0   levothyroxine (SYNTHROID, LEVOTHROID) 25 MCG tablet Take 25 mcg by mouth daily.      LORazepam (ATIVAN) 0.5 MG tablet Take 0.5 mg by mouth daily as needed for anxiety  or sleep. (Patient not taking: Reported on 12/20/2021)     losartan (COZAAR) 50 MG tablet Take 50 mg by mouth daily.     Multiple Vitamin (MULTIVITAMIN) capsule Take 1 capsule by mouth daily.     ondansetron (ZOFRAN) 8 MG tablet Take 1 tablet (8 mg total) by mouth every 8 (eight) hours as needed. 30 tablet 0   pantoprazole (PROTONIX) 20 MG tablet Take 20 mg by mouth daily.     polyethylene glycol powder (GLYCOLAX/MIRALAX) 17 GM/SCOOP powder Take 17 g by mouth at bedtime.     traMADol (ULTRAM) 50 MG tablet Take 1 tablet (50 mg total) by mouth every 6 (six) hours as needed. 30 tablet 0   No current facility-administered medications for this visit.    REVIEW OF SYSTEMS:   Constitutional: ( - ) fevers, ( - )  chills , ( - ) night sweats Eyes: ( - ) blurriness of vision, ( - ) double vision, ( - ) watery eyes Ears, nose, mouth, throat, and face: ( - ) mucositis, ( - ) sore throat Respiratory: ( - ) cough, ( - ) dyspnea, ( - ) wheezes Cardiovascular: ( - ) palpitation, ( - ) chest discomfort, ( - ) lower extremity swelling Gastrointestinal:  ( - ) nausea, ( - ) heartburn, ( - ) change in bowel habits Skin: ( - ) abnormal skin rashes Lymphatics: ( - ) new lymphadenopathy, ( - ) easy bruising Neurological: ( - ) numbness, ( - ) tingling, ( - ) new weaknesses Behavioral/Psych: ( - ) mood change, ( - ) new changes  All  other systems were reviewed with the patient and are negative.  PHYSICAL EXAMINATION: ECOG PERFORMANCE STATUS: 2 - Symptomatic, <50% confined to bed  Vitals:   01/03/22 1346  BP: (!) 121/56  Pulse: 73  Resp: 17  Temp: 97.8 F (36.6 C)  SpO2: 97%    Filed Weights   01/03/22 1346  Weight: 117 lb 12.8 oz (53.4 kg)     GENERAL: Well-appearing elderly Caucasian female, alert, no distress and comfortable SKIN: skin color, texture, turgor are normal, no rashes or significant lesions EYES: conjunctiva are pink and non-injected, sclera clear LUNGS: clear to auscultation and percussion with normal breathing effort HEART: regular rate & rhythm and no murmurs and no lower extremity edema Musculoskeletal: no cyanosis of digits and no clubbing  PSYCH: alert & oriented x 3, fluent speech NEURO: no focal motor/sensory deficits  LABORATORY DATA:  I have reviewed the data as listed    Latest Ref Rng & Units 01/03/2022   12:56 PM 12/27/2021    1:53 PM 12/20/2021   12:24 PM  CBC  WBC 4.0 - 10.5 K/uL 4.9   4.3   4.1    Hemoglobin 12.0 - 15.0 g/dL 8.8   8.5   8.7    Hematocrit 36.0 - 46.0 % 25.2   24.5   25.4    Platelets 150 - 400 K/uL 236   217   206         Latest Ref Rng & Units 01/03/2022   12:56 PM 12/27/2021    1:53 PM 12/20/2021   12:24 PM  CMP  Glucose 70 - 99 mg/dL 128   113   137    BUN 8 - 23 mg/dL '13   18   20    ' Creatinine 0.44 - 1.00 mg/dL 0.74   0.94   0.90    Sodium 135 - 145 mmol/L 134  135   134    Potassium 3.5 - 5.1 mmol/L 3.9   5.1   4.2    Chloride 98 - 111 mmol/L 105   107   105    CO2 22 - 32 mmol/L '22   21   21    ' Calcium 8.9 - 10.3 mg/dL 9.0   9.0   8.7    Total Protein 6.5 - 8.1 g/dL 6.9   7.0   7.2    Total Bilirubin 0.3 - 1.2 mg/dL 0.4   0.5   0.4    Alkaline Phos 38 - 126 U/L 111   110   127    AST 15 - 41 U/L '18   20   20    ' ALT 0 - 44 U/L '7   10   8      ' Lab Results  Component Value Date   MPROTEIN 1.0 (H) 12/20/2021   MPROTEIN 1.0 (H)  11/22/2021   MPROTEIN 3.2 (H) 10/18/2021   Lab Results  Component Value Date   KPAFRELGTCHN 18.0 01/03/2022   KPAFRELGTCHN 14.3 12/20/2021   KPAFRELGTCHN 11.2 11/22/2021   LAMBDASER 10.9 01/03/2022   LAMBDASER 8.8 12/20/2021   LAMBDASER 9.0 11/22/2021   KAPLAMBRATIO 1.65 01/03/2022   KAPLAMBRATIO 1.63 12/20/2021   KAPLAMBRATIO 1.24 11/22/2021   RADIOGRAPHIC STUDIES: No results found.  ASSESSMENT & PLAN [name] Page 88 y.o. female with medical history significant for newly diagnosed IgG lambda multiple myeloma who presents for a follow up visit.  After review the labs, review the findings, discussion with the patient the findings are most consistent with an IgG lambda multiple myeloma.  The patient has not bone marrow involvement of 20% with a plasma cell neoplasm as well as concern for lytic lesions, anemia, macrocytosis, and leukopenia.  Given her advanced age would recommend proceeding with Velcade and dexamethasone alone initially.  If she was able to tolerate this well we could consider the addition of Revlimid therapy as well.  The patient voiced understanding of the diagnosis and the treatment plan moving forward.  She was agreeable to Velcade with dexamethasone as initial treatment.  #IgG Lambda Multiple Myeloma -- Diagnosis confirmed by bone marrow biopsy on 09/27/2021 which shows evidence of 20% involvement by plasma cells.  Additionally has anemia, macrocytosis, leukopenia, and concern for lytic lesions on bone imaging --Prognostic panel shows a deletion 17 with a TP53 mutation --Started Cycle 1 Day 1 of Velcade/Dex on 10/18/2021 --assure monthly SPEP, SFLC  and weekly CBC, CMP, and LDH Plan: --Due for Cycle 4 Day 15 of Velcade/Dex today  --Labs from 12/20/2021 showed M protein improved to 1.0 with a normal kappa lambda ratio --Labs from today were reviewed and adequate for treatment today --Continue with weekly labs and Velcade/Dex treatments and RTC in 2 weeks for a follow  up visit.   # Confusion --decrease dexamethasone to 20 mg on infusion days.   #Leukopenia/Macrocytic anemia/Vitamin B12 deficiency: --Secondary to chemotherapy and multiple myeloma --Today, Hgb 8.8, MCV 100.8, WBC 4.9 --Received vitamin B12 injection 1000 mcg weekly x 4 doses. Will continue now with monthly injections.   #Right rib pain, improved: --CT chest from 10/25/2021 that didn't show any new or progressive disease.   #Abdominal cramping: --Symptoms occur after velcade injection.  --sent prescription for bentyl   #Supportive Care -- chemotherapy education to be scheduled  -- port placement not required.  -- zofran 79m q8H PRN and compazine 119mPO q6H for nausea -- acyclovir 40057mO  BID for VCZ prophylaxis. Patient was not taking medication as prescribed. Advised importance of taking medication consistently.  -- We will start Zometa as soon as is feasible after dental clearance  No orders of the defined types were placed in this encounter.  All questions were answered. The patient knows to call the clinic with any problems, questions or concerns.  I have spent a total of 30 minutes minutes of face-to-face and non-face-to-face time, preparing to see the patient, performing a medically appropriate examination, counseling and educating the patient, ordering medications/tests, documenting clinical information in the electronic health record, and care coordination.   Ledell Peoples, MD Department of Hematology/Oncology [place] [facility] Phone: [phone] Pager: [phone] Email: [email or web address]   01/07/2022 1:57 PM

## 2022-01-10 ENCOUNTER — Other Ambulatory Visit: Payer: Self-pay | Admitting: Hematology and Oncology

## 2022-01-10 ENCOUNTER — Inpatient Hospital Stay: Payer: Medicare PPO

## 2022-01-10 ENCOUNTER — Inpatient Hospital Stay: Payer: Medicare PPO | Attending: Hematology and Oncology

## 2022-01-10 ENCOUNTER — Other Ambulatory Visit: Payer: Self-pay

## 2022-01-10 VITALS — BP 134/49 | HR 73 | Temp 98.4°F | Resp 17 | Wt 116.0 lb

## 2022-01-10 DIAGNOSIS — Z5112 Encounter for antineoplastic immunotherapy: Secondary | ICD-10-CM | POA: Diagnosis not present

## 2022-01-10 DIAGNOSIS — C9000 Multiple myeloma not having achieved remission: Secondary | ICD-10-CM | POA: Insufficient documentation

## 2022-01-10 DIAGNOSIS — E538 Deficiency of other specified B group vitamins: Secondary | ICD-10-CM

## 2022-01-10 DIAGNOSIS — Z800 Family history of malignant neoplasm of digestive organs: Secondary | ICD-10-CM | POA: Diagnosis not present

## 2022-01-10 LAB — CBC WITH DIFFERENTIAL (CANCER CENTER ONLY)
Abs Immature Granulocytes: 0.02 10*3/uL (ref 0.00–0.07)
Basophils Absolute: 0 10*3/uL (ref 0.0–0.1)
Basophils Relative: 0 %
Eosinophils Absolute: 0.1 10*3/uL (ref 0.0–0.5)
Eosinophils Relative: 2 %
HCT: 27.3 % — ABNORMAL LOW (ref 36.0–46.0)
Hemoglobin: 9.2 g/dL — ABNORMAL LOW (ref 12.0–15.0)
Immature Granulocytes: 1 %
Lymphocytes Relative: 21 %
Lymphs Abs: 0.9 10*3/uL (ref 0.7–4.0)
MCH: 34.2 pg — ABNORMAL HIGH (ref 26.0–34.0)
MCHC: 33.7 g/dL (ref 30.0–36.0)
MCV: 101.5 fL — ABNORMAL HIGH (ref 80.0–100.0)
Monocytes Absolute: 0.5 10*3/uL (ref 0.1–1.0)
Monocytes Relative: 12 %
Neutro Abs: 2.8 10*3/uL (ref 1.7–7.7)
Neutrophils Relative %: 64 %
Platelet Count: 239 10*3/uL (ref 150–400)
RBC: 2.69 MIL/uL — ABNORMAL LOW (ref 3.87–5.11)
RDW: 13.5 % (ref 11.5–15.5)
WBC Count: 4.4 10*3/uL (ref 4.0–10.5)
nRBC: 0 % (ref 0.0–0.2)

## 2022-01-10 LAB — CMP (CANCER CENTER ONLY)
ALT: 11 U/L (ref 0–44)
AST: 22 U/L (ref 15–41)
Albumin: 3.8 g/dL (ref 3.5–5.0)
Alkaline Phosphatase: 101 U/L (ref 38–126)
Anion gap: 8 (ref 5–15)
BUN: 23 mg/dL (ref 8–23)
CO2: 21 mmol/L — ABNORMAL LOW (ref 22–32)
Calcium: 9.3 mg/dL (ref 8.9–10.3)
Chloride: 108 mmol/L (ref 98–111)
Creatinine: 0.78 mg/dL (ref 0.44–1.00)
GFR, Estimated: >60 mL/min (ref >60)
Glucose, Bld: 111 mg/dL — ABNORMAL HIGH (ref 70–99)
Potassium: 4.7 mmol/L (ref 3.5–5.1)
Sodium: 137 mmol/L (ref 135–145)
Total Bilirubin: 0.6 mg/dL (ref 0.3–1.2)
Total Protein: 7.3 g/dL (ref 6.5–8.1)

## 2022-01-10 LAB — LACTATE DEHYDROGENASE: LDH: 132 U/L (ref 98–192)

## 2022-01-10 MED ORDER — BORTEZOMIB CHEMO SQ INJECTION 3.5 MG (2.5MG/ML)
1.3000 mg/m2 | Freq: Once | INTRAMUSCULAR | Status: AC
  Administered 2022-01-10: 2 mg via SUBCUTANEOUS
  Filled 2022-01-10: qty 0.8

## 2022-01-10 MED ORDER — DEXAMETHASONE 4 MG PO TABS
20.0000 mg | ORAL_TABLET | Freq: Once | ORAL | Status: AC
  Administered 2022-01-10: 20 mg via ORAL
  Filled 2022-01-10: qty 5

## 2022-01-10 MED ORDER — CYANOCOBALAMIN 1000 MCG/ML IJ SOLN
1000.0000 ug | Freq: Once | INTRAMUSCULAR | Status: AC
  Administered 2022-01-10: 1000 ug via INTRAMUSCULAR
  Filled 2022-01-10: qty 1

## 2022-01-10 NOTE — Patient Instructions (Signed)
[facility]ICAL [facility]COLOG[facility]  Discharge Instructions: Thank you for choosing [facility] to provide your oncology and hematology care.   If you have a lab appointment with the Cancer Center, please go directly to the Cancer Center and check in at the registration area.   Wear comfortable clothing and clothing appropriate for easy access to any Portacath or PICC line.   We strive to give you quality time with your provider. You may need to reschedule your appointment if you arrive late (15 or more minutes).  Arriving late affects you and other patients whose appointments are after yours.  Also, if you miss three or more appointments without notifying the office, you may be dismissed from the clinic at the provider's discretion.      For prescription refill requests, have your pharmacy contact our office and allow 72 hours for refills to be completed.    Today you received the following chemotherapy and/or immunotherapy agents Velcade       To help prevent nausea and vomiting after your treatment, we encourage you to take your nausea medication as directed.  BELOW ARE SYMPTOMS THAT SHOULD BE REPORTED IMMEDIATELY: *FEVER GREATER THAN 100.4 F (38 C) OR HIGHER *CHILLS OR SWEATING *NAUSEA AND VOMITING THAT IS NOT CONTROLLED WITH YOUR NAUSEA MEDICATION *UNUSUAL SHORTNESS OF BREATH *UNUSUAL BRUISING OR BLEEDING *URINARY PROBLEMS (pain or burning when urinating, or frequent urination) *BOWEL PROBLEMS (unusual diarrhea, constipation, pain near the anus) TENDERNESS IN MOUTH AND THROAT WITH OR WITHOUT PRESENCE OF ULCERS (sore throat, sores in mouth, or a toothache) UNUSUAL RASH, SWELLING OR PAIN  UNUSUAL VAGINAL DISCHARGE OR ITCHING   Items with * indicate a potential emergency and should be followed up as soon as possible or go to the Emergency Department if any problems should occur.  Please show the CHEMOTHERAPY ALERT CARD or IMMUNOTHERAPY ALERT CARD at check-in to  the Emergency Department and triage nurse.  Should you have questions after your visit or need to cancel or reschedule your appointment, please contact [facility]  Dept: [phone]  and follow the prompts.  Office hours are 8:00 a.m. to 4:30 p.m. Monday - Friday. Please note that voicemails left after 4:00 p.m. may not be returned until the following business day.  We are closed weekends and major holidays. You have access to a nurse at all times for urgent questions. Please call the main number to the clinic Dept: [phone] and follow the prompts.   For any non-urgent questions, you may also contact your provider using MyChart. We now offer e-Visits for anyone 18 and older to request care online for non-urgent symptoms. For details visit [email or web address].   Also download the MyChart app! Go to the app store, search "MyChart", open the app, select , and log in with your MyChart username and password.  Due to Covid, a mask is required upon entering the hospital/clinic. If you do not have a mask, one will be given to you upon arrival. For doctor visits, patients may have 1 support person aged 18 or older with them. For treatment visits, patients cannot have anyone with them due to current Covid guidelines and our immunocompromised population.   

## 2022-01-13 DIAGNOSIS — H903 Sensorineural hearing loss, bilateral: Secondary | ICD-10-CM | POA: Diagnosis not present

## 2022-01-13 LAB — KAPPA/LAMBDA LIGHT CHAINS
Kappa free light chain: 13.7 mg/L (ref 3.3–19.4)
Kappa, lambda light chain ratio: 1.4 (ref 0.26–1.65)
Lambda free light chains: 9.8 mg/L (ref 5.7–26.3)

## 2022-01-15 LAB — MULTIPLE MYELOMA PANEL, SERUM
Albumin SerPl Elph-Mcnc: 3.6 g/dL (ref 2.9–4.4)
Albumin/Glob SerPl: 1.3 (ref 0.7–1.7)
Alpha 1: 0.2 g/dL (ref 0.0–0.4)
Alpha2 Glob SerPl Elph-Mcnc: 0.8 g/dL (ref 0.4–1.0)
B-Globulin SerPl Elph-Mcnc: 1.7 g/dL — ABNORMAL HIGH (ref 0.7–1.3)
Gamma Glob SerPl Elph-Mcnc: 0.3 g/dL — ABNORMAL LOW (ref 0.4–1.8)
Globulin, Total: 3 g/dL (ref 2.2–3.9)
IgA: 12 mg/dL — ABNORMAL LOW (ref 64–422)
IgG (Immunoglobin G), Serum: 904 mg/dL (ref 586–1602)
IgM (Immunoglobulin M), Srm: 19 mg/dL — ABNORMAL LOW (ref 26–217)
M Protein SerPl Elph-Mcnc: 0.6 g/dL — ABNORMAL HIGH
Total Protein ELP: 6.6 g/dL (ref 6.0–8.5)

## 2022-01-17 ENCOUNTER — Other Ambulatory Visit: Payer: Self-pay

## 2022-01-17 ENCOUNTER — Inpatient Hospital Stay: Payer: Medicare PPO | Admitting: Hematology and Oncology

## 2022-01-17 ENCOUNTER — Inpatient Hospital Stay: Payer: Medicare PPO

## 2022-01-17 ENCOUNTER — Other Ambulatory Visit: Payer: Self-pay | Admitting: Hematology and Oncology

## 2022-01-17 VITALS — BP 130/57 | HR 73 | Temp 97.7°F | Resp 18 | Ht 61.0 in | Wt 116.1 lb

## 2022-01-17 DIAGNOSIS — C9000 Multiple myeloma not having achieved remission: Secondary | ICD-10-CM

## 2022-01-17 DIAGNOSIS — D649 Anemia, unspecified: Secondary | ICD-10-CM | POA: Diagnosis not present

## 2022-01-17 DIAGNOSIS — Z800 Family history of malignant neoplasm of digestive organs: Secondary | ICD-10-CM | POA: Diagnosis not present

## 2022-01-17 DIAGNOSIS — Z5112 Encounter for antineoplastic immunotherapy: Secondary | ICD-10-CM | POA: Diagnosis not present

## 2022-01-17 LAB — CBC WITH DIFFERENTIAL (CANCER CENTER ONLY)
Abs Immature Granulocytes: 0.01 10*3/uL (ref 0.00–0.07)
Basophils Absolute: 0 10*3/uL (ref 0.0–0.1)
Basophils Relative: 0 %
Eosinophils Absolute: 0.1 10*3/uL (ref 0.0–0.5)
Eosinophils Relative: 2 %
HCT: 26 % — ABNORMAL LOW (ref 36.0–46.0)
Hemoglobin: 8.9 g/dL — ABNORMAL LOW (ref 12.0–15.0)
Immature Granulocytes: 0 %
Lymphocytes Relative: 21 %
Lymphs Abs: 1 10*3/uL (ref 0.7–4.0)
MCH: 34.5 pg — ABNORMAL HIGH (ref 26.0–34.0)
MCHC: 34.2 g/dL (ref 30.0–36.0)
MCV: 100.8 fL — ABNORMAL HIGH (ref 80.0–100.0)
Monocytes Absolute: 0.5 10*3/uL (ref 0.1–1.0)
Monocytes Relative: 9 %
Neutro Abs: 3.2 10*3/uL (ref 1.7–7.7)
Neutrophils Relative %: 68 %
Platelet Count: 202 10*3/uL (ref 150–400)
RBC: 2.58 MIL/uL — ABNORMAL LOW (ref 3.87–5.11)
RDW: 13.5 % (ref 11.5–15.5)
WBC Count: 4.8 10*3/uL (ref 4.0–10.5)
nRBC: 0 % (ref 0.0–0.2)

## 2022-01-17 LAB — CMP (CANCER CENTER ONLY)
ALT: 7 U/L (ref 0–44)
AST: 17 U/L (ref 15–41)
Albumin: 3.9 g/dL (ref 3.5–5.0)
Alkaline Phosphatase: 99 U/L (ref 38–126)
Anion gap: 7 (ref 5–15)
BUN: 16 mg/dL (ref 8–23)
CO2: 22 mmol/L (ref 22–32)
Calcium: 9 mg/dL (ref 8.9–10.3)
Chloride: 104 mmol/L (ref 98–111)
Creatinine: 0.87 mg/dL (ref 0.44–1.00)
GFR, Estimated: >60 mL/min (ref >60)
Glucose, Bld: 137 mg/dL — ABNORMAL HIGH (ref 70–99)
Potassium: 4.5 mmol/L (ref 3.5–5.1)
Sodium: 133 mmol/L — ABNORMAL LOW (ref 135–145)
Total Bilirubin: 0.3 mg/dL (ref 0.3–1.2)
Total Protein: 6.6 g/dL (ref 6.5–8.1)

## 2022-01-17 LAB — LACTATE DEHYDROGENASE: LDH: 130 U/L (ref 98–192)

## 2022-01-17 MED ORDER — DEXAMETHASONE 4 MG PO TABS
20.0000 mg | ORAL_TABLET | Freq: Once | ORAL | Status: AC
  Administered 2022-01-17: 20 mg via ORAL
  Filled 2022-01-17: qty 5

## 2022-01-17 MED ORDER — BORTEZOMIB CHEMO SQ INJECTION 3.5 MG (2.5MG/ML)
1.3000 mg/m2 | Freq: Once | INTRAMUSCULAR | Status: AC
  Administered 2022-01-17: 2 mg via SUBCUTANEOUS
  Filled 2022-01-17: qty 0.8

## 2022-01-17 NOTE — Patient Instructions (Signed)
[facility]COL[facility]  Discharge Instructions: Thank you for choosing [facility] to provide your oncology and hematology care.   If you have a lab appointment with the [facility], please go directly to the [facility] and check in at the registration area.   Wear comfortable clothing and clothing appropriate for easy access to any Portacath or PICC line.   We strive to give you quality time with your provider. You may need to reschedule your appointment if you arrive late (15 or more minutes).  Arriving late affects you and other patients whose appointments are after yours.  Also, if you miss three or more appointments without notifying the office, you may be dismissed from the clinic at the provider's discretion.      For prescription refill requests, have your pharmacy contact our office and allow 72 hours for refills to be completed.    Today you received the following chemotherapy and/or immunotherapy agents Bortezomib SubQ Injection.      To help prevent nausea and vomiting after your treatment, we encourage you to take your nausea medication as directed.  BELOW ARE SYMPTOMS THAT SHOULD BE REPORTED IMMEDIATELY: *FEVER GREATER THAN 100.4 F (38 C) OR HIGHER *CHILLS OR SWEATING *NAUSEA AND VOMITING THAT IS NOT CONTROLLED WITH YOUR NAUSEA MEDICATION *UNUSUAL SHORTNESS OF BREATH *UNUSUAL BRUISING OR BLEEDING *URINARY PROBLEMS (pain or burning when urinating, or frequent urination) *BOWEL PROBLEMS (unusual diarrhea, constipation, pain near the anus) TENDERNESS IN MOUTH AND THROAT WITH OR WITHOUT PRESENCE OF ULCERS (sore throat, sores in mouth, or a toothache) UNUSUAL RASH, SWELLING OR PAIN  UNUSUAL VAGINAL DISCHARGE OR ITCHING   Items with * indicate a potential emergency and should be followed up as soon as possible or go to the Emergency Department if any problems should occur.  Please show the CHEMOTHERAPY ALERT CARD or IMMUNOTHERAPY ALERT CARD  at check-in to the Emergency Department and triage nurse.  Should you have questions after your visit or need to cancel or reschedule your appointment, please contact [facility]  Dept: [phone]  and follow the prompts.  Office hours are 8:00 a.m. to 4:30 p.m. Monday - Friday. Please note that voicemails left after 4:00 p.m. may not be returned until the following business day.  We are closed weekends and major holidays. You have access to a nurse at all times for urgent questions. Please call the main number to the clinic Dept: [phone] and follow the prompts.   For any non-urgent questions, you may also contact your provider using MyChart. We now offer e-Visits for anyone 82 and older to request care online for non-urgent symptoms. For details visit [email or web address].   Also download the MyChart app! Go to the app store, search "MyChart", open the app, select , and log in with your MyChart username and password.  Due to Covid, a mask is required upon entering the hospital/clinic. If you do not have a mask, one will be given to you upon arrival. For doctor visits, patients may have 1 support person aged 30 or older with them. For treatment visits, patients cannot have anyone with them due to current Covid guidelines and our immunocompromised population.

## 2022-01-17 NOTE — Progress Notes (Unsigned)
[facility] [phone]   [phone]  PROGRESS NOTE  Patient Care Team: Burnard Bunting, MD as PCP - General (Internal Medicine)  Hematological/Oncological History # IgG Lambda Multiple Myeloma 06/04/2021: WBC 5.5, Hgb 11.1, MCV 101.3, Plt 306 07/24/2021: M protein 2.3, IFE shows IgG Lambda monoclonal specificity. Cr 0.9 08/07/2021: Establish care with Dr. Lorenso Courier 09/27/2021: Bmbx showed hypercellular bone marrow with plasma cell neoplasm, increased number of atypical plasma cells averaging 20% of all cells in the aspirate 10/18/2021: Cycle 1 of Velcade/Dexamethasone 11/08/2021: Cycle 2 of Velcade/Dexamethasone 11/29/2021: Cycle 3 of Velcade/Dexamethasone 12/20/2021: Cycle 4 of Velcade/Dexamethasone 01/10/2022: Cycle 5 of Velcade/Dexamethasone  Interval History:  [name] Page 88 y.o. female with medical history significant for newly diagnosed IgG lambda multiple myeloma who presents for a follow up visit. The patient's last visit was on 01/03/2021. In the interim, patient has had no major changes in her health. She is due for Cycle 5 , Day 8.  On exam today [name] Page is accompanied by her husband.  She reports she has been doing well overall interim since her last visit.  She reports that he does have some occasional cramping in the abdomen which is greatly helped with Bentyl.  Unfortunately has been contributing to some constipation.  She notes that she is not having any numbness or tingling of her fingers and toes.  She reports that she feels considerably better now than she did when she first started therapy.  She continues to take hydrocodone's 1 in the morning and 1 at night.  She reports the tramadol gave her "jumpy nerves.  Otherwise her appetite has been good and her weight has been stable.  Patient denies any fevers, chills, night sweats, shortness of breath, chest pain or cough.She has no other complaints.  Rest of the 10 point ROS is  below  MEDICAL HISTORY:  Past Medical History:  Diagnosis Date   Alopecia 10/28/2016   Anginal pain (HCC)    Arthritis    Cervical spondylosis    C3-4 AND C4-5   Chest pain 07/28/2008   H/O, normal stress nuclear EF 78%   Chronic cough 07/12/2014   Collapsed lung 07/2010   being treated at [facility] - in left lung   Complication of anesthesia    Cough 07/30/2010   Qualifier: Diagnosis of  By: Chase Caller MD, Murali     Cystocele 05/23/2015   Cystocele, midline 05/02/2013   DEEP VEIN THROMBOSIS/PHLEBITIS 07/30/2010   Qualifier: History of  By: Harvest Dark CMA, Jennifer     Dislocation closed, shoulder 02/2013   DVT (deep venous thrombosis) ([place]) 2006   Encounter for therapeutic drug monitoring 10/03/2014   GERD (gastroesophageal reflux disease)    occ   Headache    hx   High cholesterol    History of recurrent UTIs 09/24/2011   HYPERLIPIDEMIA 07/30/2010   Qualifier: Diagnosis of  By: Harvest Dark CMA, Jennifer     Hypertension    HYPERTENSION 07/30/2010   Qualifier: Diagnosis of  By: Harvest Dark CMA, Jennifer     Hypothyroidism    Nasal itching 01/30/2014   Osteopenia 05/23/2015   Peripheral vascular disease ([place]) 2006   dvt's and 50 yrs ago   Pleuritic pain 12/17/2015   PONV (postoperative nausea and vomiting)    Primary localized osteoarthritis of right hip 08/26/2016   Primary osteoarthritis of right hip 08/26/2016   Pulmonary collapse 07/30/2010   Qualifier: Diagnosis of  By: Chase Caller MD, Murali     PULMONARY NODULE 07/30/2010  Qualifier: Diagnosis of  By: Chase Caller MD, Murali     Rash and nonspecific skin eruption 01/02/2015   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   Sarcoidosis    Sarcoidosis of lung ([place])    Vaginal atrophy 09/24/2011   Visual field defect nasal step 01/02/2015    SURGICAL HISTORY: Past Surgical History:  Procedure Laterality Date   BACK SURGERY  2022   EYE SURGERY     FOOT SURGERY Left 06/2005   bunion hammer toe   HEMORRHOID SURGERY  12/1972    INTRAOCULAR LENS INSERTION     right eye 10 /16 and left 06/16/2005   JOINT REPLACEMENT     KYPHOPLASTY N/A 06/27/2021   Procedure: LUMBAR 3 AND LUMBAR  5 KYPHOPLASTY;  Surgeon: Phylliss Bob, MD;  Location: [facility];  Service: Orthopedics;  Laterality: N/A;   KYPHOPLASTY N/A 09/04/2021   Procedure: THORACIC SEVEN, THORACIC EIGHT, THORACIC NINE, THORACIC TEN KYPHOPLASTY;  Surgeon: Phylliss Bob, MD;  Location: [place];  Service: Orthopedics;  Laterality: N/A;   SKIN BIOPSY  01/2007   basal cell carcinoma removed from right lower leg    TOTAL HIP ARTHROPLASTY  06/2007   left   TOTAL HIP ARTHROPLASTY Right 08/26/2016   Procedure: TOTAL HIP ARTHROPLASTY ANTERIOR APPROACH;  Surgeon: Melrose Nakayama, MD;  Location: [place];  Service: Orthopedics;  Laterality: Right;   TOTAL KNEE ARTHROPLASTY Right 10/2007   right    SOCIAL HISTORY: Social History   Socioeconomic History   Marital status: Married    Spouse name: Not on file   Number of children: Not on file   Years of education: Not on file   Highest education level: Not on file  Occupational History   Occupation: retired    Fish farm manager: RETIRED  Tobacco Use   Smoking status: Never   Smokeless tobacco: Never  Vaping Use   Vaping Use: Never used  Substance and Sexual Activity   Alcohol use: Not Currently    Comment: BEER/glass of wine A DAY   Drug use: No   Sexual activity: Never  Other Topics Concern   Not on file  Social History Narrative   Not on file   Social Determinants of Health   Financial Resource Strain: Not on file  Food Insecurity: Not on file  Transportation Needs: Not on file  Physical Activity: Not on file  Stress: Not on file  Social Connections: Not on file  Intimate Partner Violence: Not on file    FAMILY HISTORY: Family History  Problem Relation Age of Onset   Diabetes Mother    Heart failure Mother    Hypertension Father    Diabetes Father    Heart disease Father    Heart attack Father      ALLERGIES:  has no active allergies.  MEDICATIONS:  Current Outpatient Medications  Medication Sig Dispense Refill   acyclovir (ZOVIRAX) 400 MG tablet Take 1 tablet (400 mg total) by mouth 2 (two) times daily. 60 tablet 2   amLODipine (NORVASC) 10 MG tablet Take 10 mg by mouth daily.     Calcium Citrate-Vitamin D (CALCIUM + D PO) Take 1 tablet by mouth daily.     dicyclomine (BENTYL) 10 MG capsule Take 1 capsule (10 mg total) by mouth 3 (three) times daily as needed for spasms. 90 capsule 0   gemfibrozil (LOPID) 600 MG tablet Take 600 mg by mouth 2 (two) times daily before a meal.     HYDROcodone-acetaminophen (NORCO/VICODIN) 5-325 MG tablet Take 1 tablet  by mouth every 6 (six) hours as needed for moderate pain. 30 tablet 0   levothyroxine (SYNTHROID, LEVOTHROID) 25 MCG tablet Take 25 mcg by mouth daily.      LORazepam (ATIVAN) 0.5 MG tablet Take 0.5 mg by mouth daily as needed for anxiety or sleep. (Patient not taking: Reported on 12/20/2021)     losartan (COZAAR) 50 MG tablet Take 50 mg by mouth daily.     Multiple Vitamin (MULTIVITAMIN) capsule Take 1 capsule by mouth daily.     ondansetron (ZOFRAN) 8 MG tablet Take 1 tablet (8 mg total) by mouth every 8 (eight) hours as needed. 30 tablet 0   pantoprazole (PROTONIX) 20 MG tablet Take 20 mg by mouth daily.     polyethylene glycol powder (GLYCOLAX/MIRALAX) 17 GM/SCOOP powder Take 17 g by mouth at bedtime.     traMADol (ULTRAM) 50 MG tablet Take 1 tablet (50 mg total) by mouth every 6 (six) hours as needed. 30 tablet 0   No current facility-administered medications for this visit.    REVIEW OF SYSTEMS:   Constitutional: ( - ) fevers, ( - )  chills , ( - ) night sweats Eyes: ( - ) blurriness of vision, ( - ) double vision, ( - ) watery eyes Ears, nose, mouth, throat, and face: ( - ) mucositis, ( - ) sore throat Respiratory: ( - ) cough, ( - ) dyspnea, ( - ) wheezes Cardiovascular: ( - ) palpitation, ( - ) chest discomfort, ( - ) lower  extremity swelling Gastrointestinal:  ( - ) nausea, ( - ) heartburn, ( - ) change in bowel habits Skin: ( - ) abnormal skin rashes Lymphatics: ( - ) new lymphadenopathy, ( - ) easy bruising Neurological: ( - ) numbness, ( - ) tingling, ( - ) new weaknesses Behavioral/Psych: ( - ) mood change, ( - ) new changes  All other systems were reviewed with the patient and are negative.  PHYSICAL EXAMINATION: ECOG PERFORMANCE STATUS: 2 - Symptomatic, <50% confined to bed  Vitals:   01/17/22 1353  BP: (!) 130/57  Pulse: 73  Resp: 18  Temp: 97.7 F (36.5 C)  SpO2: 99%    Filed Weights   01/17/22 1353  Weight: 116 lb 1.6 oz (52.7 kg)     GENERAL: Well-appearing elderly Caucasian female, alert, no distress and comfortable SKIN: skin color, texture, turgor are normal, no rashes or significant lesions EYES: conjunctiva are pink and non-injected, sclera clear LUNGS: clear to auscultation and percussion with normal breathing effort HEART: regular rate & rhythm and no murmurs and no lower extremity edema Musculoskeletal: no cyanosis of digits and no clubbing  PSYCH: alert & oriented x 3, fluent speech NEURO: no focal motor/sensory deficits  LABORATORY DATA:  I have reviewed the data as listed    Latest Ref Rng & Units 01/17/2022    1:42 PM 01/10/2022   12:02 PM 01/03/2022   12:56 PM  CBC  WBC 4.0 - 10.5 K/uL 4.8  4.4  4.9   Hemoglobin 12.0 - 15.0 g/dL 8.9  9.2  8.8   Hematocrit 36.0 - 46.0 % 26.0  27.3  25.2   Platelets 150 - 400 K/uL 202  239  236        Latest Ref Rng & Units 01/17/2022    1:42 PM 01/10/2022   12:02 PM 01/03/2022   12:56 PM  CMP  Glucose 70 - 99 mg/dL 137  111  128   BUN 8 - 23  mg/dL _0 Creatinine 0.44 - 1.00 mg/dL 0.87  0.78  0.74   Sodium 135 - 145 mmol/L 133  137  134   Potassium 3.5 - 5.1 mmol/L 4.5  4.7  3.9   Chloride 98 - 111 mmol/L 104  108  105   CO2 22 - 32 mmol/L _1 Calcium 8.9 - 10.3 mg/dL 9.0  9.3  9.0   Total Protein 6.5 - 8.1  g/dL 6.6  7.3  6.9   Total Bilirubin 0.3 - 1.2 mg/dL 0.3  0.6  0.4   Alkaline Phos 38 - 126 U/L 99  101  111   AST 15 - 41 U/L _2 ALT 0 - 44 U/L _3 Lab Results  Component Value Date   MPROTEIN 0.7 (H) 01/17/2022   MPROTEIN 0.6 (H) 01/10/2022   MPROTEIN 0.8 (H) 01/03/2022   Lab Results  Component Value Date   KPAFRELGTCHN 13.7 01/17/2022   KPAFRELGTCHN 13.7 01/10/2022   KPAFRELGTCHN 18.0 01/03/2022   LAMBDASER 8.5 01/17/2022   LAMBDASER 9.8 01/10/2022   LAMBDASER 10.9 01/03/2022   KAPLAMBRATIO 1.61 01/17/2022   KAPLAMBRATIO 1.40 01/10/2022   KAPLAMBRATIO 1.65 01/03/2022   RADIOGRAPHIC STUDIES: No results found.  ASSESSMENT & PLAN [name] Page 88 y.o. female with medical history significant for newly diagnosed IgG lambda multiple myeloma who presents for a follow up visit.  After review the labs, review the findings, discussion with the patient the findings are most consistent with an IgG lambda multiple myeloma.  The patient has not bone marrow involvement of 20% with a plasma cell neoplasm as well as concern for lytic lesions, anemia, macrocytosis, and leukopenia.  Given her advanced age would recommend proceeding with Velcade and dexamethasone alone initially.  If she was able to tolerate this well we could consider the addition of Revlimid therapy as well.  The patient voiced understanding of the diagnosis and the treatment plan moving forward.  She was agreeable to Velcade with dexamethasone as initial treatment.  #IgG Lambda Multiple Myeloma -- Diagnosis confirmed by bone marrow biopsy on 09/27/2021 which shows evidence of 20% involvement by plasma cells.  Additionally has anemia, macrocytosis, leukopenia, and concern for lytic lesions on bone imaging --Prognostic panel shows a deletion 17 with a TP53 mutation --Started Cycle 1 Day 1 of Velcade/Dex on 10/18/2021 --assure monthly SPEP, SFLC  and weekly CBC, CMP, and LDH Plan: --Due for Cycle 5 Day 8of  Velcade/Dex today  --Labs from 01/10/2022 showed M protein improved to 0.6 with a normal kappa lambda ratio --Labs from today were reviewed and adequate for treatment today --Continue with weekly labs and Velcade/Dex treatments and RTC in 2 weeks for a follow up visit.   # Confusion --decrease dexamethasone to 20 mg on infusion days.   #Leukopenia/Macrocytic anemia/Vitamin B12 deficiency: --Secondary to chemotherapy and multiple myeloma --Today, Hgb 8.9, MCV 100.8, WBC 4.8 --Received vitamin B12 injection 1000 mcg weekly x 4 doses. Will continue now with monthly injections.   #Right rib pain, improved: --CT chest from 10/25/2021 that didn't show any new or progressive disease.   #Abdominal cramping: --Symptoms occur after velcade injection.  --sent prescription for bentyl   #Supportive Care -- chemotherapy education to be scheduled  -- port placement not required.  -- zofran 72m q8H PRN and compazine 197mPO q6H for nausea -- acyclovir 40055mO BID for VCZ  prophylaxis. Patient was not taking medication as prescribed. Advised importance of taking medication consistently.  -- We will start Zometa as soon as is feasible after dental clearance  No orders of the defined types were placed in this encounter.  All questions were answered. The patient knows to call the clinic with any problems, questions or concerns.  I have spent a total of 30 minutes minutes of face-to-face and non-face-to-face time, preparing to see the patient, performing a medically appropriate examination, counseling and educating the patient, ordering medications/tests, documenting clinical information in the electronic health record, and care coordination.   Ledell Peoples, MD Department of Hematology/Oncology [place] [facility] Phone: [phone] Pager: [phone] Email: [email or web address]   01/21/2022 4:43 PM

## 2022-01-18 LAB — BETA 2 MICROGLOBULIN, SERUM: Beta-2 Microglobulin: 2.5 mg/L — ABNORMAL HIGH (ref 0.6–2.4)

## 2022-01-20 ENCOUNTER — Other Ambulatory Visit: Payer: Self-pay | Admitting: Hematology and Oncology

## 2022-01-20 ENCOUNTER — Telehealth: Payer: Self-pay | Admitting: *Deleted

## 2022-01-20 LAB — KAPPA/LAMBDA LIGHT CHAINS
Kappa free light chain: 13.7 mg/L (ref 3.3–19.4)
Kappa, lambda light chain ratio: 1.61 (ref 0.26–1.65)
Lambda free light chains: 8.5 mg/L (ref 5.7–26.3)

## 2022-01-20 MED ORDER — HYDROCODONE-ACETAMINOPHEN 5-325 MG PO TABS: 1.0000 | ORAL_TABLET | Freq: Four times a day (QID) | ORAL | 0 refills | Status: DC | PRN

## 2022-01-20 NOTE — Telephone Encounter (Signed)
Received call from pt's husband regarding refill for pt's hydrocodone/apap 5/325  She needs a refill today please. He thought the refill was going in last Friday. Last filled 12/18/21 for #30 tabs  Use [facility]

## 2022-01-21 ENCOUNTER — Encounter: Payer: Self-pay | Admitting: Hematology and Oncology

## 2022-01-21 LAB — MULTIPLE MYELOMA PANEL, SERUM
Albumin SerPl Elph-Mcnc: 3.5 g/dL (ref 2.9–4.4)
Albumin/Glob SerPl: 1.3 (ref 0.7–1.7)
Alpha 1: 0.3 g/dL (ref 0.0–0.4)
Alpha2 Glob SerPl Elph-Mcnc: 0.8 g/dL (ref 0.4–1.0)
B-Globulin SerPl Elph-Mcnc: 1.5 g/dL — ABNORMAL HIGH (ref 0.7–1.3)
Gamma Glob SerPl Elph-Mcnc: 0.2 g/dL — ABNORMAL LOW (ref 0.4–1.8)
Globulin, Total: 2.8 g/dL (ref 2.2–3.9)
IgA: 11 mg/dL — ABNORMAL LOW (ref 64–422)
IgG (Immunoglobin G), Serum: 816 mg/dL (ref 586–1602)
IgM (Immunoglobulin M), Srm: 17 mg/dL — ABNORMAL LOW (ref 26–217)
M Protein SerPl Elph-Mcnc: 0.7 g/dL — ABNORMAL HIGH
Total Protein ELP: 6.3 g/dL (ref 6.0–8.5)

## 2022-01-24 ENCOUNTER — Inpatient Hospital Stay (HOSPITAL_BASED_OUTPATIENT_CLINIC_OR_DEPARTMENT_OTHER): Payer: Medicare PPO

## 2022-01-24 ENCOUNTER — Inpatient Hospital Stay: Payer: Medicare PPO

## 2022-01-24 ENCOUNTER — Other Ambulatory Visit: Payer: Self-pay

## 2022-01-24 VITALS — BP 126/53 | HR 62 | Temp 98.5°F | Resp 18 | Wt 116.8 lb

## 2022-01-24 DIAGNOSIS — C9000 Multiple myeloma not having achieved remission: Secondary | ICD-10-CM | POA: Diagnosis not present

## 2022-01-24 DIAGNOSIS — Z800 Family history of malignant neoplasm of digestive organs: Secondary | ICD-10-CM | POA: Diagnosis not present

## 2022-01-24 DIAGNOSIS — Z5112 Encounter for antineoplastic immunotherapy: Secondary | ICD-10-CM | POA: Diagnosis not present

## 2022-01-24 LAB — CBC WITH DIFFERENTIAL (CANCER CENTER ONLY)
Abs Immature Granulocytes: 0 10*3/uL (ref 0.00–0.07)
Basophils Absolute: 0 10*3/uL (ref 0.0–0.1)
Basophils Relative: 0 %
Eosinophils Absolute: 0.1 10*3/uL (ref 0.0–0.5)
Eosinophils Relative: 2 %
HCT: 24.5 % — ABNORMAL LOW (ref 36.0–46.0)
Hemoglobin: 8.5 g/dL — ABNORMAL LOW (ref 12.0–15.0)
Immature Granulocytes: 0 %
Lymphocytes Relative: 22 %
Lymphs Abs: 0.9 10*3/uL (ref 0.7–4.0)
MCH: 34.6 pg — ABNORMAL HIGH (ref 26.0–34.0)
MCHC: 34.7 g/dL (ref 30.0–36.0)
MCV: 99.6 fL (ref 80.0–100.0)
Monocytes Absolute: 0.5 10*3/uL (ref 0.1–1.0)
Monocytes Relative: 12 %
Neutro Abs: 2.8 10*3/uL (ref 1.7–7.7)
Neutrophils Relative %: 64 %
Platelet Count: 207 10*3/uL (ref 150–400)
RBC: 2.46 MIL/uL — ABNORMAL LOW (ref 3.87–5.11)
RDW: 13.2 % (ref 11.5–15.5)
WBC Count: 4.4 10*3/uL (ref 4.0–10.5)
nRBC: 0 % (ref 0.0–0.2)

## 2022-01-24 LAB — CMP (CANCER CENTER ONLY)
ALT: 6 U/L (ref 0–44)
AST: 18 U/L (ref 15–41)
Albumin: 3.7 g/dL (ref 3.5–5.0)
Alkaline Phosphatase: 96 U/L (ref 38–126)
Anion gap: 6 (ref 5–15)
BUN: 16 mg/dL (ref 8–23)
CO2: 22 mmol/L (ref 22–32)
Calcium: 8.8 mg/dL — ABNORMAL LOW (ref 8.9–10.3)
Chloride: 106 mmol/L (ref 98–111)
Creatinine: 0.72 mg/dL (ref 0.44–1.00)
GFR, Estimated: >60 mL/min (ref >60)
Glucose, Bld: 136 mg/dL — ABNORMAL HIGH (ref 70–99)
Potassium: 4.1 mmol/L (ref 3.5–5.1)
Sodium: 134 mmol/L — ABNORMAL LOW (ref 135–145)
Total Bilirubin: 0.3 mg/dL (ref 0.3–1.2)
Total Protein: 6.4 g/dL — ABNORMAL LOW (ref 6.5–8.1)

## 2022-01-24 LAB — LACTATE DEHYDROGENASE: LDH: 137 U/L (ref 98–192)

## 2022-01-24 MED ORDER — BORTEZOMIB CHEMO SQ INJECTION 3.5 MG (2.5MG/ML)
1.3000 mg/m2 | Freq: Once | INTRAMUSCULAR | Status: AC
  Administered 2022-01-24: 2 mg via SUBCUTANEOUS
  Filled 2022-01-24: qty 0.8

## 2022-01-24 MED ORDER — DEXAMETHASONE 4 MG PO TABS
20.0000 mg | ORAL_TABLET | Freq: Once | ORAL | Status: AC
  Administered 2022-01-24: 20 mg via ORAL
  Filled 2022-01-24: qty 5

## 2022-01-24 NOTE — Patient Instructions (Signed)
[facility] MEDICAL [facility]COLOGY  Discharge Instructions: Thank you for choosing [facility] to provide your oncology and hematology care.   If you have a lab appointment with the Cancer Center, please go directly to the Cancer Center and check in at the registration area.   Wear comfortable clothing and clothing appropriate for easy access to any Portacath or PICC line.   We strive to give you quality time with your provider. You may need to reschedule your appointment if you arrive late (15 or more minutes).  Arriving late affects you and other patients whose appointments are after yours.  Also, if you miss three or more appointments without notifying the office, you may be dismissed from the clinic at the provider's discretion.      For prescription refill requests, have your pharmacy contact our office and allow 72 hours for refills to be completed.    Today you received the following chemotherapy and/or immunotherapy agents Velcade       To help prevent nausea and vomiting after your treatment, we encourage you to take your nausea medication as directed.  BELOW ARE SYMPTOMS THAT SHOULD BE REPORTED IMMEDIATELY: *FEVER GREATER THAN 100.4 F (38 C) OR HIGHER *CHILLS OR SWEATING *NAUSEA AND VOMITING THAT IS NOT CONTROLLED WITH YOUR NAUSEA MEDICATION *UNUSUAL SHORTNESS OF BREATH *UNUSUAL BRUISING OR BLEEDING *URINARY PROBLEMS (pain or burning when urinating, or frequent urination) *BOWEL PROBLEMS (unusual diarrhea, constipation, pain near the anus) TENDERNESS IN MOUTH AND THROAT WITH OR WITHOUT PRESENCE OF ULCERS (sore throat, sores in mouth, or a toothache) UNUSUAL RASH, SWELLING OR PAIN  UNUSUAL VAGINAL DISCHARGE OR ITCHING   Items with * indicate a potential emergency and should be followed up as soon as possible or go to the Emergency Department if any problems should occur.  Please show the CHEMOTHERAPY ALERT CARD or IMMUNOTHERAPY ALERT CARD at check-in to  the Emergency Department and triage nurse.  Should you have questions after your visit or need to cancel or reschedule your appointment, please contact [facility]  Dept: [phone]  and follow the prompts.  Office hours are 8:00 a.m. to 4:30 p.m. Monday - Friday. Please note that voicemails left after 4:00 p.m. may not be returned until the following business day.  We are closed weekends and major holidays. You have access to a nurse at all times for urgent questions. Please call the main number to the clinic Dept: [phone] and follow the prompts.   For any non-urgent questions, you may also contact your provider using MyChart. We now offer e-Visits for anyone 18 and older to request care online for non-urgent symptoms. For details visit [email or web address].   Also download the MyChart app! Go to the app store, search "MyChart", open the app, select [facility], and log in with your MyChart username and password.  Due to Covid, a mask is required upon entering the hospital/clinic. If you do not have a mask, one will be given to you upon arrival. For doctor visits, patients may have 1 support Latalia Etzler aged 18 or older with them. For treatment visits, patients cannot have anyone with them due to current Covid guidelines and our immunocompromised population.   

## 2022-01-25 LAB — BETA 2 MICROGLOBULIN, SERUM: Beta-2 Microglobulin: 2.2 mg/L (ref 0.6–2.4)

## 2022-01-27 LAB — KAPPA/LAMBDA LIGHT CHAINS
Kappa free light chain: 14.5 mg/L (ref 3.3–19.4)
Kappa, lambda light chain ratio: 1.59 (ref 0.26–1.65)
Lambda free light chains: 9.1 mg/L (ref 5.7–26.3)

## 2022-01-28 ENCOUNTER — Other Ambulatory Visit: Payer: Self-pay

## 2022-01-28 ENCOUNTER — Telehealth: Payer: Self-pay

## 2022-01-28 DIAGNOSIS — C9000 Multiple myeloma not having achieved remission: Secondary | ICD-10-CM

## 2022-01-28 MED ORDER — ACYCLOVIR 400 MG PO TABS: 400.0000 mg | ORAL_TABLET | Freq: Two times a day (BID) | ORAL | 2 refills | Status: DC

## 2022-01-28 MED ORDER — DICYCLOMINE HCL 10 MG PO CAPS: 10.0000 mg | ORAL_CAPSULE | Freq: Three times a day (TID) | ORAL | 0 refills | Status: DC | PRN

## 2022-01-28 NOTE — Telephone Encounter (Signed)
Mr Branson advised to continue meds as directed.  Refills sent

## 2022-01-28 NOTE — Telephone Encounter (Signed)
Pt's husband, [name], called asking if Sharin needs to continue taking Acyclovir and dicyclomine.  If so will need refills.  Please advise

## 2022-01-29 LAB — MULTIPLE MYELOMA PANEL, SERUM
Albumin SerPl Elph-Mcnc: 3.5 g/dL (ref 2.9–4.4)
Albumin/Glob SerPl: 1.5 (ref 0.7–1.7)
Alpha 1: 0.2 g/dL (ref 0.0–0.4)
Alpha2 Glob SerPl Elph-Mcnc: 0.7 g/dL (ref 0.4–1.0)
B-Globulin SerPl Elph-Mcnc: 1.4 g/dL — ABNORMAL HIGH (ref 0.7–1.3)
Gamma Glob SerPl Elph-Mcnc: 0.2 g/dL — ABNORMAL LOW (ref 0.4–1.8)
Globulin, Total: 2.5 g/dL (ref 2.2–3.9)
IgA: 11 mg/dL — ABNORMAL LOW (ref 64–422)
IgG (Immunoglobin G), Serum: 760 mg/dL (ref 586–1602)
IgM (Immunoglobulin M), Srm: 15 mg/dL — ABNORMAL LOW (ref 26–217)
M Protein SerPl Elph-Mcnc: 0.7 g/dL — ABNORMAL HIGH
Total Protein ELP: 6 g/dL (ref 6.0–8.5)

## 2022-01-31 ENCOUNTER — Inpatient Hospital Stay: Payer: Medicare PPO

## 2022-01-31 ENCOUNTER — Other Ambulatory Visit: Payer: Self-pay

## 2022-01-31 ENCOUNTER — Inpatient Hospital Stay: Payer: Medicare PPO | Admitting: Hematology and Oncology

## 2022-01-31 VITALS — BP 138/55 | HR 76 | Temp 97.7°F | Resp 18 | Ht 61.0 in | Wt 116.0 lb

## 2022-01-31 DIAGNOSIS — C9000 Multiple myeloma not having achieved remission: Secondary | ICD-10-CM

## 2022-01-31 DIAGNOSIS — D649 Anemia, unspecified: Secondary | ICD-10-CM

## 2022-01-31 DIAGNOSIS — Z5112 Encounter for antineoplastic immunotherapy: Secondary | ICD-10-CM | POA: Diagnosis not present

## 2022-01-31 DIAGNOSIS — Z800 Family history of malignant neoplasm of digestive organs: Secondary | ICD-10-CM | POA: Diagnosis not present

## 2022-01-31 LAB — CBC WITH DIFFERENTIAL (CANCER CENTER ONLY)
Abs Immature Granulocytes: 0.01 10*3/uL (ref 0.00–0.07)
Basophils Absolute: 0 10*3/uL (ref 0.0–0.1)
Basophils Relative: 0 %
Eosinophils Absolute: 0.1 10*3/uL (ref 0.0–0.5)
Eosinophils Relative: 2 %
HCT: 25.8 % — ABNORMAL LOW (ref 36.0–46.0)
Hemoglobin: 9 g/dL — ABNORMAL LOW (ref 12.0–15.0)
Immature Granulocytes: 0 %
Lymphocytes Relative: 27 %
Lymphs Abs: 1 10*3/uL (ref 0.7–4.0)
MCH: 34.4 pg — ABNORMAL HIGH (ref 26.0–34.0)
MCHC: 34.9 g/dL (ref 30.0–36.0)
MCV: 98.5 fL (ref 80.0–100.0)
Monocytes Absolute: 0.6 10*3/uL (ref 0.1–1.0)
Monocytes Relative: 17 %
Neutro Abs: 2 10*3/uL (ref 1.7–7.7)
Neutrophils Relative %: 54 %
Platelet Count: 210 10*3/uL (ref 150–400)
RBC: 2.62 MIL/uL — ABNORMAL LOW (ref 3.87–5.11)
RDW: 13.2 % (ref 11.5–15.5)
WBC Count: 3.6 10*3/uL — ABNORMAL LOW (ref 4.0–10.5)
nRBC: 0 % (ref 0.0–0.2)

## 2022-01-31 LAB — CMP (CANCER CENTER ONLY)
ALT: 8 U/L (ref 0–44)
AST: 18 U/L (ref 15–41)
Albumin: 3.9 g/dL (ref 3.5–5.0)
Alkaline Phosphatase: 92 U/L (ref 38–126)
Anion gap: 8 (ref 5–15)
BUN: 18 mg/dL (ref 8–23)
CO2: 21 mmol/L — ABNORMAL LOW (ref 22–32)
Calcium: 9.2 mg/dL (ref 8.9–10.3)
Chloride: 106 mmol/L (ref 98–111)
Creatinine: 0.9 mg/dL (ref 0.44–1.00)
GFR, Estimated: >60 mL/min (ref >60)
Glucose, Bld: 115 mg/dL — ABNORMAL HIGH (ref 70–99)
Potassium: 4.5 mmol/L (ref 3.5–5.1)
Sodium: 135 mmol/L (ref 135–145)
Total Bilirubin: 0.4 mg/dL (ref 0.3–1.2)
Total Protein: 6.6 g/dL (ref 6.5–8.1)

## 2022-01-31 LAB — LACTATE DEHYDROGENASE: LDH: 141 U/L (ref 98–192)

## 2022-01-31 MED ORDER — BORTEZOMIB CHEMO SQ INJECTION 3.5 MG (2.5MG/ML)
1.3000 mg/m2 | Freq: Once | INTRAMUSCULAR | Status: AC
  Administered 2022-01-31: 2 mg via SUBCUTANEOUS
  Filled 2022-01-31: qty 0.8

## 2022-01-31 MED ORDER — DEXAMETHASONE 4 MG PO TABS
20.0000 mg | ORAL_TABLET | Freq: Once | ORAL | Status: AC
  Administered 2022-01-31: 20 mg via ORAL

## 2022-01-31 NOTE — Progress Notes (Signed)
[facility] [phone]) [phone]   [phone]) [phone]  PROGRESS NOTE  Patient Care Team: Burnard Bunting, MD as PCP - General (Internal Medicine)  Hematological/Oncological History # IgG Lambda Multiple Myeloma 06/04/2021: WBC 5.5, Hgb 11.1, MCV 101.3, Plt 306 07/24/2021: M protein 2.3, IFE shows IgG Lambda monoclonal specificity. Cr 0.9 08/07/2021: Establish care with Dr. Lorenso Courier 09/27/2021: Bmbx showed hypercellular bone marrow with plasma cell neoplasm, increased number of atypical plasma cells averaging 20% of all cells in the aspirate 10/18/2021: Cycle 1 of Velcade/Dexamethasone 11/08/2021: Cycle 2 of Velcade/Dexamethasone 11/29/2021: Cycle 3 of Velcade/Dexamethasone 12/20/2021: Cycle 4 of Velcade/Dexamethasone 01/10/2022: Cycle 5 of Velcade/Dexamethasone 01/31/2022: Cycle 6 of Velcade/Dexamethasone  Interval History:  [name] Page 88 y.o. female with medical history significant for newly diagnosed IgG lambda multiple myeloma who presents for a follow up visit. The patient's last visit was on 01/17/2021. In the interim, patient has had no major changes in her health. She is due for Cycle 6 , Day 1.  On exam today [name] Page is accompanied by her husband.  She reports she is doing quite well today.  She notes that she is not having any major changes in her health.  She reports that she has been in less pain.  She is taking naproxen and Aleve instead of her hydrocodone and that is managing the pain quite well.  She notes that she is taking the medication about once a day on average.  She notes she is not having any numbness or tingling of the fingers and toes.  Her appetite has been good and her weight has been steady.  She reports that she is been trying to eat more protein rich foods such as cashews.  She notes that she is not having any issues with bleeding though she is having some bruising.  She notes her bowel movements are regular with no nausea, vomiting, or  diarrhea.  She is also been taking MiraLAX Metamucil and milk of magnesia if she feels like she is getting bound up.  She reports that Bentyl has not been used as often as her symptoms are improving.  Patient denies any fevers, chills, night sweats, shortness of breath, chest pain or cough.She has no other complaints.  Rest of the 10 point ROS is below  MEDICAL HISTORY:  Past Medical History:  Diagnosis Date   Alopecia 10/28/2016   Anginal pain (HCC)    Arthritis    Cervical spondylosis    C3-4 AND C4-5   Chest pain 07/28/2008   H/O, normal stress nuclear EF 78%   Chronic cough 07/12/2014   Collapsed lung 07/2010   being treated at [facility] - in left lung   Complication of anesthesia    Cough 07/30/2010   Qualifier: Diagnosis of  By: Chase Caller MD, Murali     Cystocele 05/23/2015   Cystocele, midline 05/02/2013   DEEP VEIN THROMBOSIS/PHLEBITIS 07/30/2010   Qualifier: History of  By: Harvest Dark CMA, Jennifer     Dislocation closed, shoulder 02/2013   DVT (deep venous thrombosis) ([place]) 2006   Encounter for therapeutic drug monitoring 10/03/2014   GERD (gastroesophageal reflux disease)    occ   Headache    hx   High cholesterol    History of recurrent UTIs 09/24/2011   HYPERLIPIDEMIA 07/30/2010   Qualifier: Diagnosis of  By: Harvest Dark CMA, Jennifer     Hypertension    HYPERTENSION 07/30/2010   Qualifier: Diagnosis of  By: Harvest Dark CMA, Jennifer     Hypothyroidism  Nasal itching 01/30/2014   Osteopenia 05/23/2015   Peripheral vascular disease ([place]) 2006   dvt's and 50 yrs ago   Pleuritic pain 12/17/2015   PONV (postoperative nausea and vomiting)    Primary localized osteoarthritis of right hip 08/26/2016   Primary osteoarthritis of right hip 08/26/2016   Pulmonary collapse 07/30/2010   Qualifier: Diagnosis of  By: Chase Caller MD, Murali     PULMONARY NODULE 07/30/2010   Qualifier: Diagnosis of  By: Chase Caller MD, Murali     Rash and nonspecific skin eruption 01/02/2015   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   Sarcoidosis    Sarcoidosis of lung ([place])    Vaginal atrophy 09/24/2011   Visual field defect nasal step 01/02/2015    SURGICAL HISTORY: Past Surgical History:  Procedure Laterality Date   BACK SURGERY  2022   EYE SURGERY     FOOT SURGERY Left 06/2005   bunion hammer toe   HEMORRHOID SURGERY  12/1972   INTRAOCULAR LENS INSERTION     right eye 10 /16 and left 06/16/2005   JOINT REPLACEMENT     KYPHOPLASTY N/A 06/27/2021   Procedure: LUMBAR 3 AND LUMBAR  5 KYPHOPLASTY;  Surgeon: Phylliss Bob, MD;  Location: [facility];  Service: Orthopedics;  Laterality: N/A;   KYPHOPLASTY N/A 09/04/2021   Procedure: THORACIC SEVEN, THORACIC EIGHT, THORACIC NINE, THORACIC TEN KYPHOPLASTY;  Surgeon: Phylliss Bob, MD;  Location: [facility];  Service: Orthopedics;  Laterality: N/A;   SKIN BIOPSY  01/2007   basal cell carcinoma removed from right lower leg    TOTAL HIP ARTHROPLASTY  06/2007   left   TOTAL HIP ARTHROPLASTY Right 08/26/2016   Procedure: TOTAL HIP ARTHROPLASTY ANTERIOR APPROACH;  Surgeon: Melrose Nakayama, MD;  Location: [facility];  Service: Orthopedics;  Laterality: Right;   TOTAL KNEE ARTHROPLASTY Right 10/2007   right    SOCIAL HISTORY: Social History   Socioeconomic History   Marital status: Married    Spouse name: Not on file   Number of children: Not on file   Years of education: Not on file   Highest education level: Not on file  Occupational History   Occupation: retired    Fish farm manager: RETIRED  Tobacco Use   Smoking status: Never   Smokeless tobacco: Never  Vaping Use   Vaping Use: Never used  Substance and Sexual Activity   Alcohol use: Not Currently    Comment: BEER/glass of wine A DAY   Drug use: No   Sexual activity: Never  Other Topics Concern   Not on file  Social History Narrative   Not on file   Social Determinants of Health   Financial Resource Strain: Not on file  Food Insecurity: Not on file  Transportation Needs: Not on file   Physical Activity: Not on file  Stress: Not on file  Social Connections: Not on file  Intimate Partner Violence: Not on file    FAMILY HISTORY: Family History  Problem Relation Age of Onset   Diabetes Mother    Heart failure Mother    Hypertension Father    Diabetes Father    Heart disease Father    Heart attack Father     ALLERGIES:  has no active allergies.  MEDICATIONS:  Current Outpatient Medications  Medication Sig Dispense Refill   acyclovir (ZOVIRAX) 400 MG tablet Take 1 tablet (400 mg total) by mouth 2 (two) times daily. 60 tablet 2   amLODipine (NORVASC) 10 MG tablet Take 10 mg by mouth daily.  Calcium Citrate-Vitamin D (CALCIUM + D PO) Take 1 tablet by mouth daily.     dicyclomine (BENTYL) 10 MG capsule Take 1 capsule (10 mg total) by mouth 3 (three) times daily as needed for spasms. 90 capsule 0   gemfibrozil (LOPID) 600 MG tablet Take 600 mg by mouth 2 (two) times daily before a meal.     HYDROcodone-acetaminophen (NORCO/VICODIN) 5-325 MG tablet Take 1 tablet by mouth every 6 (six) hours as needed for moderate pain. 30 tablet 0   levothyroxine (SYNTHROID, LEVOTHROID) 25 MCG tablet Take 25 mcg by mouth daily.      LORazepam (ATIVAN) 0.5 MG tablet Take 0.5 mg by mouth daily as needed for anxiety or sleep. (Patient not taking: Reported on 12/20/2021)     losartan (COZAAR) 50 MG tablet Take 50 mg by mouth daily.     Multiple Vitamin (MULTIVITAMIN) capsule Take 1 capsule by mouth daily.     ondansetron (ZOFRAN) 8 MG tablet Take 1 tablet (8 mg total) by mouth every 8 (eight) hours as needed. 30 tablet 0   pantoprazole (PROTONIX) 20 MG tablet Take 20 mg by mouth daily.     polyethylene glycol powder (GLYCOLAX/MIRALAX) 17 GM/SCOOP powder Take 17 g by mouth at bedtime.     traMADol (ULTRAM) 50 MG tablet Take 1 tablet (50 mg total) by mouth every 6 (six) hours as needed. 30 tablet 0   No current facility-administered medications for this visit.    REVIEW OF SYSTEMS:    Constitutional: ( - ) fevers, ( - )  chills , ( - ) night sweats Eyes: ( - ) blurriness of vision, ( - ) double vision, ( - ) watery eyes Ears, nose, mouth, throat, and face: ( - ) mucositis, ( - ) sore throat Respiratory: ( - ) cough, ( - ) dyspnea, ( - ) wheezes Cardiovascular: ( - ) palpitation, ( - ) chest discomfort, ( - ) lower extremity swelling Gastrointestinal:  ( - ) nausea, ( - ) heartburn, ( - ) change in bowel habits Skin: ( - ) abnormal skin rashes Lymphatics: ( - ) new lymphadenopathy, ( - ) easy bruising Neurological: ( - ) numbness, ( - ) tingling, ( - ) new weaknesses Behavioral/Psych: ( - ) mood change, ( - ) new changes  All other systems were reviewed with the patient and are negative.  PHYSICAL EXAMINATION: ECOG PERFORMANCE STATUS: 2 - Symptomatic, <50% confined to bed  Vitals:   01/31/22 1331  BP: (!) 138/55  Pulse: 76  Resp: 18  Temp: 97.7 F (36.5 C)  SpO2: 98%    Filed Weights   01/31/22 1331  Weight: 116 lb (52.6 kg)     GENERAL: Well-appearing elderly Caucasian female, alert, no distress and comfortable SKIN: skin color, texture, turgor are normal, no rashes or significant lesions EYES: conjunctiva are pink and non-injected, sclera clear LUNGS: clear to auscultation and percussion with normal breathing effort HEART: regular rate & rhythm and no murmurs and no lower extremity edema Musculoskeletal: no cyanosis of digits and no clubbing  PSYCH: alert & oriented x 3, fluent speech NEURO: no focal motor/sensory deficits  LABORATORY DATA:  I have reviewed the data as listed    Latest Ref Rng & Units 01/31/2022    1:20 PM 01/24/2022    1:19 PM 01/17/2022    1:42 PM  CBC  WBC 4.0 - 10.5 K/uL 3.6  4.4  4.8   Hemoglobin 12.0 - 15.0 g/dL 9.0  8.5  8.9   Hematocrit 36.0 - 46.0 % 25.8  24.5  26.0   Platelets 150 - 400 K/uL 210  207  202        Latest Ref Rng & Units 01/31/2022    1:20 PM 01/24/2022    1:19 PM 01/17/2022    1:42 PM  CMP  Glucose 70  - 99 mg/dL 115  136  137   BUN 8 - 23 mg/dL '18  16  16   ' Creatinine 0.44 - 1.00 mg/dL 0.90  0.72  0.87   Sodium 135 - 145 mmol/L 135  134  133   Potassium 3.5 - 5.1 mmol/L 4.5  4.1  4.5   Chloride 98 - 111 mmol/L 106  106  104   CO2 22 - 32 mmol/L '21  22  22   ' Calcium 8.9 - 10.3 mg/dL 9.2  8.8  9.0   Total Protein 6.5 - 8.1 g/dL 6.6  6.4  6.6   Total Bilirubin 0.3 - 1.2 mg/dL 0.4  0.3  0.3   Alkaline Phos 38 - 126 U/L 92  96  99   AST 15 - 41 U/L '18  18  17   ' ALT 0 - 44 U/L '8  6  7     ' Lab Results  Component Value Date   MPROTEIN 0.7 (H) 01/24/2022   MPROTEIN 0.7 (H) 01/17/2022   MPROTEIN 0.6 (H) 01/10/2022   Lab Results  Component Value Date   KPAFRELGTCHN 14.5 01/24/2022   KPAFRELGTCHN 13.7 01/17/2022   KPAFRELGTCHN 13.7 01/10/2022   LAMBDASER 9.1 01/24/2022   LAMBDASER 8.5 01/17/2022   LAMBDASER 9.8 01/10/2022   KAPLAMBRATIO 1.59 01/24/2022   KAPLAMBRATIO 1.61 01/17/2022   KAPLAMBRATIO 1.40 01/10/2022   RADIOGRAPHIC STUDIES: No results found.  ASSESSMENT & PLAN [name] Page 88 y.o. female with medical history significant for newly diagnosed IgG lambda multiple myeloma who presents for a follow up visit.  After review the labs, review the findings, discussion with the patient the findings are most consistent with an IgG lambda multiple myeloma.  The patient has not bone marrow involvement of 20% with a plasma cell neoplasm as well as concern for lytic lesions, anemia, macrocytosis, and leukopenia.  Given her advanced age would recommend proceeding with Velcade and dexamethasone alone initially.  If she was able to tolerate this well we could consider the addition of Revlimid therapy as well.  The patient voiced understanding of the diagnosis and the treatment plan moving forward.  She was agreeable to Velcade with dexamethasone as initial treatment.  #IgG Lambda Multiple Myeloma -- Diagnosis confirmed by bone marrow biopsy on 09/27/2021 which shows evidence of 20%  involvement by plasma cells.  Additionally has anemia, macrocytosis, leukopenia, and concern for lytic lesions on bone imaging --Prognostic panel shows a deletion 17 with a TP53 mutation --Started Cycle 1 Day 1 of Velcade/Dex on 10/18/2021 --assure monthly SPEP, SFLC  and weekly CBC, CMP, and LDH Plan: --Due for Cycle 6 Day 1 of Velcade/Dex today  --Labs from 01/24/2022 showed M protein improved to 0.7 with a normal kappa lambda ratio --Labs from today were reviewed and adequate for treatment today --Continue with weekly labs and Velcade/Dex treatments and RTC in 2 weeks for a follow up visit.   # Confusion --decrease dexamethasone to 20 mg on infusion days.   #Leukopenia/Macrocytic anemia/Vitamin B12 deficiency: --Secondary to chemotherapy and multiple myeloma --Today, Hgb 9.0, MCV 98.5, WBC 3.6 --Received vitamin B12 injection 1000 mcg weekly x 4  doses. Will continue now with monthly injections.   #Right rib pain, improved: --CT chest from 10/25/2021 that didn't show any new or progressive disease.   #Abdominal cramping: --Symptoms occur after velcade injection.  --sent prescription for bentyl   #Supportive Care -- chemotherapy education complete -- port placement not required.  -- zofran 74m q8H PRN and compazine 163mPO q6H for nausea -- acyclovir 40039mO BID for VCZ prophylaxis. Patient was not taking medication as prescribed. Advised importance of taking medication consistently.  -- We will start Zometa as soon as is feasible after dental clearance  No orders of the defined types were placed in this encounter.  All questions were answered. The patient knows to call the clinic with any problems, questions or concerns.  I have spent a total of 30 minutes minutes of face-to-face and non-face-to-face time, preparing to see the patient, performing a medically appropriate examination, counseling and educating the patient, ordering medications/tests, documenting clinical information  in the electronic health record, and care coordination.   JohLedell PeoplesD Department of Hematology/Oncology [place] [facility]: [phone]: [phone]: [email or web address]   02/06/2022 3:53 PM

## 2022-02-06 ENCOUNTER — Encounter: Payer: Self-pay | Admitting: Hematology and Oncology

## 2022-02-07 ENCOUNTER — Other Ambulatory Visit (HOSPITAL_COMMUNITY): Payer: Self-pay | Admitting: Family Medicine

## 2022-02-07 ENCOUNTER — Encounter: Payer: Self-pay | Admitting: Hematology and Oncology

## 2022-02-07 ENCOUNTER — Inpatient Hospital Stay: Payer: Medicare PPO

## 2022-02-07 ENCOUNTER — Other Ambulatory Visit: Payer: Self-pay

## 2022-02-07 ENCOUNTER — Ambulatory Visit (HOSPITAL_COMMUNITY)
Admission: RE | Admit: 2022-02-07 | Discharge: 2022-02-07 | Disposition: A | Discharge status: Home / Self Care | Payer: Medicare PPO | Source: Ambulatory Visit | Attending: Family Medicine | Admitting: Family Medicine

## 2022-02-07 DIAGNOSIS — C9000 Multiple myeloma not having achieved remission: Secondary | ICD-10-CM

## 2022-02-07 DIAGNOSIS — M7989 Other specified soft tissue disorders: Secondary | ICD-10-CM

## 2022-02-07 DIAGNOSIS — I10 Essential (primary) hypertension: Secondary | ICD-10-CM | POA: Diagnosis not present

## 2022-02-07 DIAGNOSIS — Z800 Family history of malignant neoplasm of digestive organs: Secondary | ICD-10-CM | POA: Diagnosis not present

## 2022-02-07 DIAGNOSIS — M79606 Pain in leg, unspecified: Secondary | ICD-10-CM | POA: Insufficient documentation

## 2022-02-07 DIAGNOSIS — Z5112 Encounter for antineoplastic immunotherapy: Secondary | ICD-10-CM | POA: Diagnosis not present

## 2022-02-07 DIAGNOSIS — R600 Localized edema: Secondary | ICD-10-CM | POA: Diagnosis not present

## 2022-02-07 LAB — CBC WITH DIFFERENTIAL (CANCER CENTER ONLY)
Abs Immature Granulocytes: 0.02 10*3/uL (ref 0.00–0.07)
Basophils Absolute: 0 10*3/uL (ref 0.0–0.1)
Basophils Relative: 0 %
Eosinophils Absolute: 0.1 10*3/uL (ref 0.0–0.5)
Eosinophils Relative: 1 %
HCT: 28 % — ABNORMAL LOW (ref 36.0–46.0)
Hemoglobin: 9.6 g/dL — ABNORMAL LOW (ref 12.0–15.0)
Immature Granulocytes: 0 %
Lymphocytes Relative: 22 %
Lymphs Abs: 1.1 10*3/uL (ref 0.7–4.0)
MCH: 34.2 pg — ABNORMAL HIGH (ref 26.0–34.0)
MCHC: 34.3 g/dL (ref 30.0–36.0)
MCV: 99.6 fL (ref 80.0–100.0)
Monocytes Absolute: 0.7 10*3/uL (ref 0.1–1.0)
Monocytes Relative: 13 %
Neutro Abs: 3.1 10*3/uL (ref 1.7–7.7)
Neutrophils Relative %: 64 %
Platelet Count: 233 10*3/uL (ref 150–400)
RBC: 2.81 MIL/uL — ABNORMAL LOW (ref 3.87–5.11)
RDW: 13.3 % (ref 11.5–15.5)
WBC Count: 4.9 10*3/uL (ref 4.0–10.5)
nRBC: 0 % (ref 0.0–0.2)

## 2022-02-07 LAB — CMP (CANCER CENTER ONLY)
ALT: 7 U/L (ref 0–44)
AST: 18 U/L (ref 15–41)
Albumin: 4.3 g/dL (ref 3.5–5.0)
Alkaline Phosphatase: 90 U/L (ref 38–126)
Anion gap: 8 (ref 5–15)
BUN: 14 mg/dL (ref 8–23)
CO2: 23 mmol/L (ref 22–32)
Calcium: 9.5 mg/dL (ref 8.9–10.3)
Chloride: 104 mmol/L (ref 98–111)
Creatinine: 0.75 mg/dL (ref 0.44–1.00)
GFR, Estimated: >60 mL/min (ref >60)
Glucose, Bld: 129 mg/dL — ABNORMAL HIGH (ref 70–99)
Potassium: 4 mmol/L (ref 3.5–5.1)
Sodium: 135 mmol/L (ref 135–145)
Total Bilirubin: 0.4 mg/dL (ref 0.3–1.2)
Total Protein: 7.4 g/dL (ref 6.5–8.1)

## 2022-02-07 LAB — LACTATE DEHYDROGENASE: LDH: 155 U/L (ref 98–192)

## 2022-02-07 NOTE — Progress Notes (Signed)
Per [place] PA, hold tx today due to new onset peripheral neuropathy X2 days. Pt to be reassessed by MD at next appointment.

## 2022-02-14 ENCOUNTER — Inpatient Hospital Stay: Payer: Medicare PPO | Attending: Hematology and Oncology

## 2022-02-14 ENCOUNTER — Inpatient Hospital Stay: Payer: Medicare PPO

## 2022-02-14 ENCOUNTER — Inpatient Hospital Stay: Payer: Medicare PPO | Admitting: Hematology and Oncology

## 2022-02-14 ENCOUNTER — Other Ambulatory Visit: Payer: Self-pay

## 2022-02-14 VITALS — BP 138/70 | HR 70 | Temp 98.0°F | Resp 15 | Wt 115.2 lb

## 2022-02-14 DIAGNOSIS — Z800 Family history of malignant neoplasm of digestive organs: Secondary | ICD-10-CM | POA: Diagnosis not present

## 2022-02-14 DIAGNOSIS — C9000 Multiple myeloma not having achieved remission: Secondary | ICD-10-CM

## 2022-02-14 DIAGNOSIS — E538 Deficiency of other specified B group vitamins: Secondary | ICD-10-CM | POA: Diagnosis present

## 2022-02-14 DIAGNOSIS — Z5112 Encounter for antineoplastic immunotherapy: Secondary | ICD-10-CM | POA: Insufficient documentation

## 2022-02-14 DIAGNOSIS — D649 Anemia, unspecified: Secondary | ICD-10-CM | POA: Diagnosis not present

## 2022-02-14 DIAGNOSIS — R109 Unspecified abdominal pain: Secondary | ICD-10-CM | POA: Insufficient documentation

## 2022-02-14 LAB — CMP (CANCER CENTER ONLY)
ALT: 7 U/L (ref 0–44)
AST: 18 U/L (ref 15–41)
Albumin: 4.2 g/dL (ref 3.5–5.0)
Alkaline Phosphatase: 89 U/L (ref 38–126)
Anion gap: 8 (ref 5–15)
BUN: 19 mg/dL (ref 8–23)
CO2: 23 mmol/L (ref 22–32)
Calcium: 9.2 mg/dL (ref 8.9–10.3)
Chloride: 103 mmol/L (ref 98–111)
Creatinine: 0.81 mg/dL (ref 0.44–1.00)
GFR, Estimated: >60 mL/min (ref >60)
Glucose, Bld: 130 mg/dL — ABNORMAL HIGH (ref 70–99)
Potassium: 3.9 mmol/L (ref 3.5–5.1)
Sodium: 134 mmol/L — ABNORMAL LOW (ref 135–145)
Total Bilirubin: 0.3 mg/dL (ref 0.3–1.2)
Total Protein: 7.1 g/dL (ref 6.5–8.1)

## 2022-02-14 LAB — CBC WITH DIFFERENTIAL (CANCER CENTER ONLY)
Abs Immature Granulocytes: 0.01 10*3/uL (ref 0.00–0.07)
Basophils Absolute: 0 10*3/uL (ref 0.0–0.1)
Basophils Relative: 0 %
Eosinophils Absolute: 0.1 10*3/uL (ref 0.0–0.5)
Eosinophils Relative: 1 %
HCT: 26.7 % — ABNORMAL LOW (ref 36.0–46.0)
Hemoglobin: 9.4 g/dL — ABNORMAL LOW (ref 12.0–15.0)
Immature Granulocytes: 0 %
Lymphocytes Relative: 20 %
Lymphs Abs: 0.9 10*3/uL (ref 0.7–4.0)
MCH: 34.8 pg — ABNORMAL HIGH (ref 26.0–34.0)
MCHC: 35.2 g/dL (ref 30.0–36.0)
MCV: 98.9 fL (ref 80.0–100.0)
Monocytes Absolute: 0.5 10*3/uL (ref 0.1–1.0)
Monocytes Relative: 12 %
Neutro Abs: 3 10*3/uL (ref 1.7–7.7)
Neutrophils Relative %: 67 %
Platelet Count: 277 10*3/uL (ref 150–400)
RBC: 2.7 MIL/uL — ABNORMAL LOW (ref 3.87–5.11)
RDW: 13.2 % (ref 11.5–15.5)
WBC Count: 4.5 10*3/uL (ref 4.0–10.5)
nRBC: 0 % (ref 0.0–0.2)

## 2022-02-14 LAB — LACTATE DEHYDROGENASE: LDH: 155 U/L (ref 98–192)

## 2022-02-14 MED ORDER — BORTEZOMIB CHEMO SQ INJECTION 3.5 MG (2.5MG/ML)
1.3000 mg/m2 | Freq: Once | INTRAMUSCULAR | Status: AC
  Administered 2022-02-14: 2 mg via SUBCUTANEOUS
  Filled 2022-02-14: qty 0.8

## 2022-02-14 MED ORDER — DEXAMETHASONE 4 MG PO TABS
20.0000 mg | ORAL_TABLET | Freq: Once | ORAL | Status: AC
  Administered 2022-02-14: 20 mg via ORAL
  Filled 2022-02-14: qty 5

## 2022-02-14 MED ORDER — CYANOCOBALAMIN 1000 MCG/ML IJ SOLN
1000.0000 ug | Freq: Once | INTRAMUSCULAR | Status: AC
  Administered 2022-02-14: 1000 ug via INTRAMUSCULAR
  Filled 2022-02-14: qty 1

## 2022-02-14 NOTE — Progress Notes (Signed)
[place] [phone]   [phone]  PROGRESS NOTE  Patient Care Team: Burnard Bunting, MD as PCP - General (Internal Medicine)  Hematological/Oncological History # IgG Lambda Multiple Myeloma 06/04/2021: WBC 5.5, Hgb 11.1, MCV 101.3, Plt 306 07/24/2021: M protein 2.3, IFE shows IgG Lambda monoclonal specificity. Cr 0.9 08/07/2021: Establish care with Dr. Lorenso Courier 09/27/2021: Bmbx showed hypercellular bone marrow with plasma cell neoplasm, increased number of atypical plasma cells averaging 20% of all cells in the aspirate 10/18/2021: Cycle 1 of Velcade/Dexamethasone 11/08/2021: Cycle 2 of Velcade/Dexamethasone 11/29/2021: Cycle 3 of Velcade/Dexamethasone 12/20/2021: Cycle 4 of Velcade/Dexamethasone 01/10/2022: Cycle 5 of Velcade/Dexamethasone 01/31/2022: Cycle 6 of Velcade/Dexamethasone  Interval History:  [name] 88 y.o. female with medical history significant for newly diagnosed IgG lambda multiple myeloma who presents for a follow up visit. The patient's last visit was on 01/31/2021. In the interim, patient has had no major changes in her health. She is due for Cycle 6 , Day 15.  On exam today Mrs. Skibinski is accompanied by her husband.  She reports she has been fine overall in the interim since her last visit.  She is having little bit of tingling of her hands but she is "not sure".  She reports that she notes a little bit of that tingling when she is washing her dishes.  It is predominately across her palms.  She is having some continued swelling of her lower extremities.  She notes that she raises her legs overnight or when sitting down and that helps to keep the fluid levels low.  She also reports that diuretic is helping.  She notes that her pain levels are "okay".  She notes it is mostly controlled with naproxen with some occasional hydrocodone.  She takes maybe 1-3 of these per day.  Overall her pain is under control.  Her appetite is quite good  and overall she feels well with the exception of some easy tiring and fatigue.  Patient denies any fevers, chills, night sweats, shortness of breath, chest pain or cough.She has no other complaints.  Rest of the 10 point ROS is below  MEDICAL HISTORY:  Past Medical History:  Diagnosis Date   Alopecia 10/28/2016   Anginal pain (HCC)    Arthritis    Cervical spondylosis    C3-4 AND C4-5   Chest pain 07/28/2008   H/O, normal stress nuclear EF 78%   Chronic cough 07/12/2014   Collapsed lung 07/2010   being treated at [facility] - in left lung   Complication of anesthesia    Cough 07/30/2010   Qualifier: Diagnosis of  By: Chase Caller MD, Murali     Cystocele 05/23/2015   Cystocele, midline 05/02/2013   DEEP VEIN THROMBOSIS/PHLEBITIS 07/30/2010   Qualifier: History of  By: Harvest Dark CMA, Jennifer     Dislocation closed, shoulder 02/2013   DVT (deep venous thrombosis) ([place]) 2006   Encounter for therapeutic drug monitoring 10/03/2014   GERD (gastroesophageal reflux disease)    occ   Headache    hx   High cholesterol    History of recurrent UTIs 09/24/2011   HYPERLIPIDEMIA 07/30/2010   Qualifier: Diagnosis of  By: Harvest Dark CMA, Jennifer     Hypertension    HYPERTENSION 07/30/2010   Qualifier: Diagnosis of  By: Harvest Dark CMA, Jennifer     Hypothyroidism    Nasal itching 01/30/2014   Osteopenia 05/23/2015   Peripheral vascular disease ([place]) 2006   dvt's and 50 yrs ago   Pleuritic pain  12/17/2015   PONV (postoperative nausea and vomiting)    Primary localized osteoarthritis of right hip 08/26/2016   Primary osteoarthritis of right hip 08/26/2016   Pulmonary collapse 07/30/2010   Qualifier: Diagnosis of  By: Chase Caller MD, Murali     PULMONARY NODULE 07/30/2010   Qualifier: Diagnosis of  By: Chase Caller MD, Murali     Rash and nonspecific skin eruption 01/02/2015   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   Sarcoidosis    Sarcoidosis of lung ([place])    Vaginal atrophy 09/24/2011   Visual field  defect nasal step 01/02/2015    SURGICAL HISTORY: Past Surgical History:  Procedure Laterality Date   BACK SURGERY  2022   EYE SURGERY     FOOT SURGERY Left 06/2005   bunion hammer toe   HEMORRHOID SURGERY  12/1972   INTRAOCULAR LENS INSERTION     right eye 10 /16 and left 06/16/2005   JOINT REPLACEMENT     KYPHOPLASTY N/A 06/27/2021   Procedure: LUMBAR 3 AND LUMBAR  5 KYPHOPLASTY;  Surgeon: Phylliss Bob, MD;  Location: [place];  Service: Orthopedics;  Laterality: N/A;   KYPHOPLASTY N/A 09/04/2021   Procedure: THORACIC SEVEN, THORACIC EIGHT, THORACIC NINE, THORACIC TEN KYPHOPLASTY;  Surgeon: Phylliss Bob, MD;  Location: [facility];  Service: Orthopedics;  Laterality: N/A;   SKIN BIOPSY  01/2007   basal cell carcinoma removed from right lower leg    TOTAL HIP ARTHROPLASTY  06/2007   left   TOTAL HIP ARTHROPLASTY Right 08/26/2016   Procedure: TOTAL HIP ARTHROPLASTY ANTERIOR APPROACH;  Surgeon: Melrose Nakayama, MD;  Location: [place];  Service: Orthopedics;  Laterality: Right;   TOTAL KNEE ARTHROPLASTY Right 10/2007   right    SOCIAL HISTORY: Social History   Socioeconomic History   Marital status: Married    Spouse name: Not on file   Number of children: Not on file   Years of education: Not on file   Highest education level: Not on file  Occupational History   Occupation: retired    Fish farm manager: RETIRED  Tobacco Use   Smoking status: Never   Smokeless tobacco: Never  Vaping Use   Vaping Use: Never used  Substance and Sexual Activity   Alcohol use: Not Currently    Comment: BEER/glass of wine A DAY   Drug use: No   Sexual activity: Never  Other Topics Concern   Not on file  Social History Narrative   Not on file   Social Determinants of Health   Financial Resource Strain: Not on file  Food Insecurity: Not on file  Transportation Needs: Not on file  Physical Activity: Not on file  Stress: Not on file  Social Connections: Not on file  Intimate Partner Violence: Not on  file    FAMILY HISTORY: Family History  Problem Relation Age of Onset   Diabetes Mother    Heart failure Mother    Hypertension Father    Diabetes Father    Heart disease Father    Heart attack Father     ALLERGIES:  has no active allergies.  MEDICATIONS:  Current Outpatient Medications  Medication Sig Dispense Refill   acyclovir (ZOVIRAX) 400 MG tablet Take 1 tablet (400 mg total) by mouth 2 (two) times daily. 60 tablet 2   amLODipine (NORVASC) 10 MG tablet Take 10 mg by mouth daily.     Calcium Citrate-Vitamin D (CALCIUM + D PO) Take 1 tablet by mouth daily.     dicyclomine (BENTYL) 10 MG capsule  Take 1 capsule (10 mg total) by mouth 3 (three) times daily as needed for spasms. 90 capsule 0   gemfibrozil (LOPID) 600 MG tablet Take 600 mg by mouth 2 (two) times daily before a meal.     HYDROcodone-acetaminophen (NORCO/VICODIN) 5-325 MG tablet Take 1 tablet by mouth every 6 (six) hours as needed for moderate pain. 30 tablet 0   levothyroxine (SYNTHROID, LEVOTHROID) 25 MCG tablet Take 25 mcg by mouth daily.      LORazepam (ATIVAN) 0.5 MG tablet Take 0.5 mg by mouth daily as needed for anxiety or sleep. (Patient not taking: Reported on 12/20/2021)     losartan (COZAAR) 50 MG tablet Take 50 mg by mouth daily.     Multiple Vitamin (MULTIVITAMIN) capsule Take 1 capsule by mouth daily.     ondansetron (ZOFRAN) 8 MG tablet Take 1 tablet (8 mg total) by mouth every 8 (eight) hours as needed. 30 tablet 0   pantoprazole (PROTONIX) 20 MG tablet Take 20 mg by mouth daily.     polyethylene glycol powder (GLYCOLAX/MIRALAX) 17 GM/SCOOP powder Take 17 g by mouth at bedtime.     traMADol (ULTRAM) 50 MG tablet Take 1 tablet (50 mg total) by mouth every 6 (six) hours as needed. 30 tablet 0   No current facility-administered medications for this visit.    REVIEW OF SYSTEMS:   Constitutional: ( - ) fevers, ( - )  chills , ( - ) night sweats Eyes: ( - ) blurriness of vision, ( - ) double vision, ( - )  watery eyes Ears, nose, mouth, throat, and face: ( - ) mucositis, ( - ) sore throat Respiratory: ( - ) cough, ( - ) dyspnea, ( - ) wheezes Cardiovascular: ( - ) palpitation, ( - ) chest discomfort, ( - ) lower extremity swelling Gastrointestinal:  ( - ) nausea, ( - ) heartburn, ( - ) change in bowel habits Skin: ( - ) abnormal skin rashes Lymphatics: ( - ) new lymphadenopathy, ( - ) easy bruising Neurological: ( - ) numbness, ( - ) tingling, ( - ) new weaknesses Behavioral/Psych: ( - ) mood change, ( - ) new changes  All other systems were reviewed with the patient and are negative.  PHYSICAL EXAMINATION: ECOG PERFORMANCE STATUS: 2 - Symptomatic, <50% confined to bed  Vitals:   02/14/22 1337  BP: 138/70  Pulse: 70  Resp: 15  Temp: 98 F (36.7 C)  SpO2: 97%    Filed Weights   02/14/22 1337  Weight: 115 lb 3.2 oz (52.3 kg)     GENERAL: Well-appearing elderly Caucasian female, alert, no distress and comfortable SKIN: skin color, texture, turgor are normal, no rashes or significant lesions EYES: conjunctiva are pink and non-injected, sclera clear LUNGS: clear to auscultation and percussion with normal breathing effort HEART: regular rate & rhythm and no murmurs and no lower extremity edema Musculoskeletal: no cyanosis of digits and no clubbing  PSYCH: alert & oriented x 3, fluent speech NEURO: no focal motor/sensory deficits  LABORATORY DATA:  I have reviewed the data as listed    Latest Ref Rng & Units 02/14/2022    1:07 PM 02/07/2022   12:30 PM 01/31/2022    1:20 PM  CBC  WBC 4.0 - 10.5 K/uL 4.5  4.9  3.6   Hemoglobin 12.0 - 15.0 g/dL 9.4  9.6  9.0   Hematocrit 36.0 - 46.0 % 26.7  28.0  25.8   Platelets 150 - 400 K/uL 277  233  210        Latest Ref Rng & Units 02/14/2022    1:07 PM 02/07/2022   12:30 PM 01/31/2022    1:20 PM  CMP  Glucose 70 - 99 mg/dL 130  129  115   BUN 8 - 23 mg/dL $Remove'19  14  18   'fiEZzQt$ Creatinine 0.44 - 1.00 mg/dL 0.81  0.75  0.90   Sodium 135 - 145  mmol/L 134  135  135   Potassium 3.5 - 5.1 mmol/L 3.9  4.0  4.5   Chloride 98 - 111 mmol/L 103  104  106   CO2 22 - 32 mmol/L $RemoveB'23  23  21   'RwRTfsEv$ Calcium 8.9 - 10.3 mg/dL 9.2  9.5  9.2   Total Protein 6.5 - 8.1 g/dL 7.1  7.4  6.6   Total Bilirubin 0.3 - 1.2 mg/dL 0.3  0.4  0.4   Alkaline Phos 38 - 126 U/L 89  90  92   AST 15 - 41 U/L $Remo'18  18  18   'oindC$ ALT 0 - 44 U/L $Remo'7  7  8     'wWKMb$ Lab Results  Component Value Date   MPROTEIN 0.5 (H) 02/14/2022   MPROTEIN 0.7 (H) 01/24/2022   MPROTEIN 0.7 (H) 01/17/2022   Lab Results  Component Value Date   KPAFRELGTCHN 15.4 02/14/2022   KPAFRELGTCHN 14.5 01/24/2022   KPAFRELGTCHN 13.7 01/17/2022   LAMBDASER 11.3 02/14/2022   LAMBDASER 9.1 01/24/2022   LAMBDASER 8.5 01/17/2022   KAPLAMBRATIO 1.36 02/14/2022   KAPLAMBRATIO 1.59 01/24/2022   KAPLAMBRATIO 1.61 01/17/2022   RADIOGRAPHIC STUDIES: VAS [place] LOWER EXTREMITY VENOUS (DVT)  Result Date: 02/08/2022  Lower Venous DVT Study Patient Name:  [name] Page  Date of Exam:   02/07/2022 Medical Rec #: [id number]        Accession #:    [id number] Date of Birth: 1931/08/07       Patient Gender: F Patient Age:   88 years Exam Location:  [facility] Procedure:      VAS [place] LOWER EXTREMITY VENOUS (DVT) Referring Phys: LINDSAY LEE --------------------------------------------------------------------------------  Indications: Edema.  Risk Factors: Chemotherapy. Comparison Study: No previous exams Performing Technologist: Jody Hill RVT, RDMS  Examination Guidelines: A complete evaluation includes B-mode imaging, spectral Doppler, color Doppler, and power Doppler as needed of all accessible portions of each vessel. Bilateral testing is considered an integral part of a complete examination. Limited examinations for reoccurring indications may be performed as noted. The reflux portion of the exam is performed with the patient in reverse Trendelenburg.  +---------+---------------+---------+-----------+----------+--------------+  RIGHT    CompressibilityPhasicitySpontaneityPropertiesThrombus Aging +---------+---------------+---------+-----------+----------+--------------+ CFV      Full           Yes      Yes                                 +---------+---------------+---------+-----------+----------+--------------+ SFJ      Full                                                        +---------+---------------+---------+-----------+----------+--------------+ FV Prox  Full           Yes      Yes                                 +---------+---------------+---------+-----------+----------+--------------+  FV Mid   Full           Yes      Yes                                 +---------+---------------+---------+-----------+----------+--------------+ FV DistalFull           Yes      Yes                                 +---------+---------------+---------+-----------+----------+--------------+ PFV      Full                                                        +---------+---------------+---------+-----------+----------+--------------+ POP      Full           Yes      Yes                                 +---------+---------------+---------+-----------+----------+--------------+ PTV      Full                                                        +---------+---------------+---------+-----------+----------+--------------+ PERO     Full                                                        +---------+---------------+---------+-----------+----------+--------------+   +---------+---------------+---------+-----------+----------+--------------+ LEFT     CompressibilityPhasicitySpontaneityPropertiesThrombus Aging +---------+---------------+---------+-----------+----------+--------------+ CFV      Full           Yes      Yes                                 +---------+---------------+---------+-----------+----------+--------------+ SFJ      Full                                                         +---------+---------------+---------+-----------+----------+--------------+ FV Prox  Full           Yes      Yes                                 +---------+---------------+---------+-----------+----------+--------------+ FV Mid   Full           Yes      Yes                                 +---------+---------------+---------+-----------+----------+--------------+ FV DistalFull  Yes      Yes                                 +---------+---------------+---------+-----------+----------+--------------+ PFV      Full                                                        +---------+---------------+---------+-----------+----------+--------------+ POP      Full           Yes      Yes                                 +---------+---------------+---------+-----------+----------+--------------+ PTV      Full                                                        +---------+---------------+---------+-----------+----------+--------------+ PERO     Full                                                        +---------+---------------+---------+-----------+----------+--------------+     Summary: BILATERAL: - No evidence of deep vein thrombosis seen in the lower extremities, bilaterally. -No evidence of popliteal cyst, bilaterally. - Subcutaneous edema seen in area of calf and ankle, bilaterally. (L > R)  *See table(s) above for measurements and observations. Electronically signed by Jamelle Haring on 02/08/2022 at 9:42:51 AM.    Final     ASSESSMENT & PLAN [name] 88 y.o. female with medical history significant for newly diagnosed IgG lambda multiple myeloma who presents for a follow up visit.  After review the labs, review the findings, discussion with the patient the findings are most consistent with an IgG lambda multiple myeloma.  The patient has not bone marrow involvement of 20% with a plasma cell neoplasm as well as concern for  lytic lesions, anemia, macrocytosis, and leukopenia.  Given her advanced age would recommend proceeding with Velcade and dexamethasone alone initially.  If she was able to tolerate this well we could consider the addition of Revlimid therapy as well.  The patient voiced understanding of the diagnosis and the treatment plan moving forward.  She was agreeable to Velcade with dexamethasone as initial treatment.  #IgG Lambda Multiple Myeloma -- Diagnosis confirmed by bone marrow biopsy on 09/27/2021 which shows evidence of 20% involvement by plasma cells.  Additionally has anemia, macrocytosis, leukopenia, and concern for lytic lesions on bone imaging --Prognostic panel shows a deletion 17 with a TP53 mutation --Started Cycle 1 Day 1 of Velcade/Dex on 10/18/2021 --assure monthly SPEP, SFLC  and weekly CBC, CMP, and LDH Plan: --Due for Cycle 6 Day 15 of Velcade/Dex today  --Labs from 01/24/2022 showed M protein improved to 0.7 with a normal kappa lambda ratio --Labs from today were reviewed and adequate for treatment today --Continue with weekly labs and Velcade/Dex treatments and RTC in 2 weeks  for a follow up visit.   # Confusion --decrease dexamethasone to 20 mg on infusion days.   #Leukopenia/Macrocytic anemia/Vitamin B12 deficiency: --Secondary to chemotherapy and multiple myeloma --Today, Hgb 9.4, MCV 98.9, WBC 4.5 --Received vitamin B12 injection 1000 mcg weekly x 4 doses. Will continue now with monthly injections.   #Right rib pain, improved: --CT chest from 10/25/2021 that didn't show any new or progressive disease.   #Abdominal cramping: --Symptoms occur after velcade injection.  --sent prescription for bentyl   #Supportive Care -- chemotherapy education complete -- port placement not required.  -- zofran 8mg  q8H PRN and compazine 10mg  PO q6H for nausea -- acyclovir 400mg  PO BID for VCZ prophylaxis. Patient was not taking medication as prescribed. Advised importance of taking  medication consistently.  -- We will start Zometa as soon as is feasible after dental clearance  No orders of the defined types were placed in this encounter.  All questions were answered. The patient knows to call the clinic with any problems, questions or concerns.  I have spent a total of 30 minutes minutes of face-to-face and non-face-to-face time, preparing to see the patient, performing a medically appropriate examination, counseling and educating the patient, ordering medications/tests, documenting clinical information in the electronic health record, and care coordination.   Ledell Peoples, MD Department of Hematology/Oncology [place] [facility] Phone: [phone] Pager: [phone] Email: [email or web address]   02/23/2022 4:28 PM

## 2022-02-15 LAB — BETA 2 MICROGLOBULIN, SERUM: Beta-2 Microglobulin: 2.4 mg/L (ref 0.6–2.4)

## 2022-02-17 LAB — KAPPA/LAMBDA LIGHT CHAINS
Kappa free light chain: 15.4 mg/L (ref 3.3–19.4)
Kappa, lambda light chain ratio: 1.36 (ref 0.26–1.65)
Lambda free light chains: 11.3 mg/L (ref 5.7–26.3)

## 2022-02-18 LAB — MULTIPLE MYELOMA PANEL, SERUM
Albumin SerPl Elph-Mcnc: 3.6 g/dL (ref 2.9–4.4)
Albumin/Glob SerPl: 1.3 (ref 0.7–1.7)
Alpha 1: 0.3 g/dL (ref 0.0–0.4)
Alpha2 Glob SerPl Elph-Mcnc: 0.8 g/dL (ref 0.4–1.0)
B-Globulin SerPl Elph-Mcnc: 1.4 g/dL — ABNORMAL HIGH (ref 0.7–1.3)
Gamma Glob SerPl Elph-Mcnc: 0.3 g/dL — ABNORMAL LOW (ref 0.4–1.8)
Globulin, Total: 2.8 g/dL (ref 2.2–3.9)
IgA: 15 mg/dL — ABNORMAL LOW (ref 64–422)
IgG (Immunoglobin G), Serum: 726 mg/dL (ref 586–1602)
IgM (Immunoglobulin M), Srm: 16 mg/dL — ABNORMAL LOW (ref 26–217)
M Protein SerPl Elph-Mcnc: 0.5 g/dL — ABNORMAL HIGH
Total Protein ELP: 6.4 g/dL (ref 6.0–8.5)

## 2022-02-19 ENCOUNTER — Telehealth: Payer: Self-pay | Admitting: *Deleted

## 2022-02-19 NOTE — Telephone Encounter (Signed)
Received vm message from pt asking about treatment related neuropathy in her hands. TCT patient  and spoke to her.  She states she ti Hinks her neuropathy might be a bit worse.  She states they feel extra warm but when she touches them they feel normal-it is more of a sensation of warmth. She is not having difficulties with buttons or zippers or holding her coffee cup or glass or utensils. She is concerned that it may get worse and she doesn't want that. She is asking if there is another treatment for her myeloma that she could have that doesn't cause neuropathy. Advised that I will discuss with Dr. Lorenso Courier and call her back. She voiced understanding.

## 2022-02-19 NOTE — Telephone Encounter (Signed)
TCT patient after discussing her situation with Dr. Lorenso Courier. He is ok if pt prefers to skip her treatment this week. He will see her next week and discuss options at that time.  Loreen voiced understanding. Appt for 02/21/22 have been cancelled

## 2022-02-20 ENCOUNTER — Encounter: Payer: Self-pay | Admitting: Hematology and Oncology

## 2022-02-20 NOTE — Telephone Encounter (Signed)
TC note entered

## 2022-02-21 ENCOUNTER — Inpatient Hospital Stay: Payer: Medicare PPO

## 2022-02-23 ENCOUNTER — Encounter: Payer: Self-pay | Admitting: Hematology and Oncology

## 2022-02-28 ENCOUNTER — Inpatient Hospital Stay: Payer: Medicare PPO

## 2022-02-28 ENCOUNTER — Other Ambulatory Visit: Payer: Self-pay

## 2022-02-28 ENCOUNTER — Inpatient Hospital Stay (HOSPITAL_BASED_OUTPATIENT_CLINIC_OR_DEPARTMENT_OTHER): Payer: Medicare PPO | Admitting: Physician Assistant

## 2022-02-28 VITALS — BP 151/66 | HR 73 | Temp 97.8°F | Resp 15 | Wt 115.3 lb

## 2022-02-28 DIAGNOSIS — C9000 Multiple myeloma not having achieved remission: Secondary | ICD-10-CM | POA: Diagnosis not present

## 2022-02-28 DIAGNOSIS — Z5112 Encounter for antineoplastic immunotherapy: Secondary | ICD-10-CM | POA: Diagnosis not present

## 2022-02-28 LAB — CMP (CANCER CENTER ONLY)
ALT: 7 U/L (ref 0–44)
AST: 18 U/L (ref 15–41)
Albumin: 4.3 g/dL (ref 3.5–5.0)
Alkaline Phosphatase: 89 U/L (ref 38–126)
Anion gap: 10 (ref 5–15)
BUN: 16 mg/dL (ref 8–23)
CO2: 20 mmol/L — ABNORMAL LOW (ref 22–32)
Calcium: 9.4 mg/dL (ref 8.9–10.3)
Chloride: 101 mmol/L (ref 98–111)
Creatinine: 0.73 mg/dL (ref 0.44–1.00)
GFR, Estimated: >60 mL/min (ref >60)
Glucose, Bld: 115 mg/dL — ABNORMAL HIGH (ref 70–99)
Potassium: 4.3 mmol/L (ref 3.5–5.1)
Sodium: 131 mmol/L — ABNORMAL LOW (ref 135–145)
Total Bilirubin: 0.3 mg/dL (ref 0.3–1.2)
Total Protein: 7.1 g/dL (ref 6.5–8.1)

## 2022-02-28 LAB — CBC WITH DIFFERENTIAL (CANCER CENTER ONLY)
Abs Immature Granulocytes: 0.01 10*3/uL (ref 0.00–0.07)
Basophils Absolute: 0 10*3/uL (ref 0.0–0.1)
Basophils Relative: 0 %
Eosinophils Absolute: 0.1 10*3/uL (ref 0.0–0.5)
Eosinophils Relative: 2 %
HCT: 26.9 % — ABNORMAL LOW (ref 36.0–46.0)
Hemoglobin: 9.7 g/dL — ABNORMAL LOW (ref 12.0–15.0)
Immature Granulocytes: 0 %
Lymphocytes Relative: 21 %
Lymphs Abs: 1.1 10*3/uL (ref 0.7–4.0)
MCH: 34.9 pg — ABNORMAL HIGH (ref 26.0–34.0)
MCHC: 36.1 g/dL — ABNORMAL HIGH (ref 30.0–36.0)
MCV: 96.8 fL (ref 80.0–100.0)
Monocytes Absolute: 0.6 10*3/uL (ref 0.1–1.0)
Monocytes Relative: 11 %
Neutro Abs: 3.5 10*3/uL (ref 1.7–7.7)
Neutrophils Relative %: 66 %
Platelet Count: 285 10*3/uL (ref 150–400)
RBC: 2.78 MIL/uL — ABNORMAL LOW (ref 3.87–5.11)
RDW: 13 % (ref 11.5–15.5)
WBC Count: 5.3 10*3/uL (ref 4.0–10.5)
nRBC: 0 % (ref 0.0–0.2)

## 2022-02-28 LAB — LACTATE DEHYDROGENASE: LDH: 151 U/L (ref 98–192)

## 2022-02-28 MED ORDER — BORTEZOMIB CHEMO SQ INJECTION 3.5 MG (2.5MG/ML)
1.0000 mg/m2 | Freq: Once | INTRAMUSCULAR | Status: AC
  Administered 2022-02-28: 1.5 mg via SUBCUTANEOUS
  Filled 2022-02-28: qty 0.6

## 2022-02-28 MED ORDER — DEXAMETHASONE 4 MG PO TABS
20.0000 mg | ORAL_TABLET | Freq: Once | ORAL | Status: AC
  Administered 2022-02-28: 20 mg via ORAL
  Filled 2022-02-28: qty 5

## 2022-02-28 NOTE — Patient Instructions (Signed)
[facility]COLOG[facility]  Discharge Instructions: Thank you for choosing [facility] to provide your oncology and hematology care.   If you have a lab appointment with the [facility], please go directly to the [facility] and check in at the registration area.   Wear comfortable clothing and clothing appropriate for easy access to any Portacath or PICC line.   We strive to give you quality time with your provider. You may need to reschedule your appointment if you arrive late (15 or more minutes).  Arriving late affects you and other patients whose appointments are after yours.  Also, if you miss three or more appointments without notifying the office, you may be dismissed from the clinic at the provider's discretion.      For prescription refill requests, have your pharmacy contact our office and allow 72 hours for refills to be completed.    Today you received the following chemotherapy and/or immunotherapy agents: bortezomib      To help prevent nausea and vomiting after your treatment, we encourage you to take your nausea medication as directed.  BELOW ARE SYMPTOMS THAT SHOULD BE REPORTED IMMEDIATELY: *FEVER GREATER THAN 100.4 F (38 C) OR HIGHER *CHILLS OR SWEATING *NAUSEA AND VOMITING THAT IS NOT CONTROLLED WITH YOUR NAUSEA MEDICATION *UNUSUAL SHORTNESS OF BREATH *UNUSUAL BRUISING OR BLEEDING *URINARY PROBLEMS (pain or burning when urinating, or frequent urination) *BOWEL PROBLEMS (unusual diarrhea, constipation, pain near the anus) TENDERNESS IN MOUTH AND THROAT WITH OR WITHOUT PRESENCE OF ULCERS (sore throat, sores in mouth, or a toothache) UNUSUAL RASH, SWELLING OR PAIN  UNUSUAL VAGINAL DISCHARGE OR ITCHING   Items with * indicate a potential emergency and should be followed up as soon as possible or go to the Emergency Department if any problems should occur.  Please show the CHEMOTHERAPY ALERT CARD or IMMUNOTHERAPY ALERT CARD at check-in to  the Emergency Department and triage nurse.  Should you have questions after your visit or need to cancel or reschedule your appointment, please contact [facility]  Dept: [phone]  and follow the prompts.  Office hours are 8:00 a.m. to 4:30 p.m. Monday - Friday. Please note that voicemails left after 4:00 p.m. may not be returned until the following business day.  We are closed weekends and major holidays. You have access to a nurse at all times for urgent questions. Please call the main number to the clinic Dept: [phone] and follow the prompts.   For any non-urgent questions, you may also contact your provider using MyChart. We now offer e-Visits for anyone 17 and older to request care online for non-urgent symptoms. For details visit [email or web address].   Also download the MyChart app! Go to the app store, search "MyChart", open the app, select [facility], and log in with your MyChart username and password.  Masks are optional in the cancer centers. If you would like for your care team to wear a mask while they are taking care of you, please let them know. For doctor visits, patients may have with them one support person who is at least 86 years old. At this time, visitors are not allowed in the infusion area.

## 2022-03-03 ENCOUNTER — Other Ambulatory Visit: Payer: Self-pay

## 2022-03-03 ENCOUNTER — Encounter: Payer: Self-pay | Admitting: Hematology and Oncology

## 2022-03-03 NOTE — Progress Notes (Signed)
[facility] Telephone:([phone]   [phone]  PROGRESS NOTE  Patient Care Team: Burnard Bunting, MD as PCP - General (Internal Medicine)  Hematological/Oncological History # IgG Lambda Multiple Myeloma 06/04/2021: WBC 5.5, Hgb 11.1, MCV 101.3, Plt 306 07/24/2021: M protein 2.3, IFE shows IgG Lambda monoclonal specificity. Cr 0.9 08/07/2021: Establish care with Dr. Lorenso Courier 09/27/2021: Bmbx showed hypercellular bone marrow with plasma cell neoplasm, increased number of atypical plasma cells averaging 20% of all cells in the aspirate 10/18/2021: Cycle 1 of Velcade/Dexamethasone 11/08/2021: Cycle 2 of Velcade/Dexamethasone 11/29/2021: Cycle 3 of Velcade/Dexamethasone 12/20/2021: Cycle 4 of Velcade/Dexamethasone 01/10/2022: Cycle 5 of Velcade/Dexamethasone 01/31/2022: Cycle 6 of Velcade/Dexamethasone 02/28/2022: Cycle 7 of Velcade/Dexamethasone. Dose reduced velcade due to neuropathy   Interval History:  [name] Page 88 y.o. female with medical history significant for newly diagnosed IgG lambda multiple myeloma who presents for a follow up visit. The patient's last visit was on 02/14/2021. In the interim, patient has had no major changes in her health. She is due for Cycle 7, Day 1.  On exam today [name] Page is accompanied by her husband.  She reports worsening neuropathy in her finger tips that is persistent. She denies any interference with grip or dexterity. She denies any neuropathy in her toes/feet. She reports her energy levels are stable. She denies any appetite or weight changes. She denies nausea, vomiting or abdominal pain. Her bowel habits are unchanged without any recurrent episodes of diarrhea or constipation. She was diagnosed with a UTI yesterday due to ongoing urinary symptoms which included cloudy  urine and foul smelling urine. She has started antibiotic therapy for the UTI. She denies easy bruising or signs of active bleeding.  Her pain is well  controlled with naproxen with some occasional hydrocodone.  She takes maybe 1-3 of these per day. Patient denies any fevers, chills, night sweats, shortness of breath, chest pain or cough.She has no other complaints.  Rest of the 10 point ROS is below  MEDICAL HISTORY:  Past Medical History:  Diagnosis Date   Alopecia 10/28/2016   Anginal pain (HCC)    Arthritis    Cervical spondylosis    C3-4 AND C4-5   Chest pain 07/28/2008   H/O, normal stress nuclear EF 78%   Chronic cough 07/12/2014   Collapsed lung 07/2010   being treated at [facility] - in left lung   Complication of anesthesia    Cough 07/30/2010   Qualifier: Diagnosis of  By: Chase Caller MD, Murali     Cystocele 05/23/2015   Cystocele, midline 05/02/2013   DEEP VEIN THROMBOSIS/PHLEBITIS 07/30/2010   Qualifier: History of  By: Harvest Dark CMA, Jennifer     Dislocation closed, shoulder 02/2013   DVT (deep venous thrombosis) ([place]) 2006   Encounter for therapeutic drug monitoring 10/03/2014   GERD (gastroesophageal reflux disease)    occ   Headache    hx   High cholesterol    History of recurrent UTIs 09/24/2011   HYPERLIPIDEMIA 07/30/2010   Qualifier: Diagnosis of  By: Harvest Dark CMA, Jennifer     Hypertension    HYPERTENSION 07/30/2010   Qualifier: Diagnosis of  By: Harvest Dark CMA, Jennifer     Hypothyroidism    Nasal itching 01/30/2014   Osteopenia 05/23/2015   Peripheral vascular disease ([place]) 2006   dvt's and 50 yrs ago   Pleuritic pain 12/17/2015   PONV (postoperative nausea and vomiting)    Primary localized osteoarthritis of right hip 08/26/2016   Primary osteoarthritis of right hip  08/26/2016   Pulmonary collapse 07/30/2010   Qualifier: Diagnosis of  By: Chase Caller MD, Murali     PULMONARY NODULE 07/30/2010   Qualifier: Diagnosis of  By: Chase Caller MD, Murali     Rash and nonspecific skin eruption 01/02/2015   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   Sarcoidosis    Sarcoidosis of lung ([place])    Vaginal atrophy  09/24/2011   Visual field defect nasal step 01/02/2015    SURGICAL HISTORY: Past Surgical History:  Procedure Laterality Date   BACK SURGERY  2022   EYE SURGERY     FOOT SURGERY Left 06/2005   bunion hammer toe   HEMORRHOID SURGERY  12/1972   INTRAOCULAR LENS INSERTION     right eye 10 /16 and left 06/16/2005   JOINT REPLACEMENT     KYPHOPLASTY N/A 06/27/2021   Procedure: LUMBAR 3 AND LUMBAR  5 KYPHOPLASTY;  Surgeon: Phylliss Bob, MD;  Location: [facility];  Service: Orthopedics;  Laterality: N/A;   KYPHOPLASTY N/A 09/04/2021   Procedure: THORACIC SEVEN, THORACIC EIGHT, THORACIC NINE, THORACIC TEN KYPHOPLASTY;  Surgeon: Phylliss Bob, MD;  Location: [facility];  Service: Orthopedics;  Laterality: N/A;   SKIN BIOPSY  01/2007   basal cell carcinoma removed from right lower leg    TOTAL HIP ARTHROPLASTY  06/2007   left   TOTAL HIP ARTHROPLASTY Right 08/26/2016   Procedure: TOTAL HIP ARTHROPLASTY ANTERIOR APPROACH;  Surgeon: Melrose Nakayama, MD;  Location: [facility];  Service: Orthopedics;  Laterality: Right;   TOTAL KNEE ARTHROPLASTY Right 10/2007   right    SOCIAL HISTORY: Social History   Socioeconomic History   Marital status: Married    Spouse name: Not on file   Number of children: Not on file   Years of education: Not on file   Highest education level: Not on file  Occupational History   Occupation: retired    Fish farm manager: RETIRED  Tobacco Use   Smoking status: Never   Smokeless tobacco: Never  Vaping Use   Vaping Use: Never used  Substance and Sexual Activity   Alcohol use: Not Currently    Comment: BEER/glass of wine A DAY   Drug use: No   Sexual activity: Never  Other Topics Concern   Not on file  Social History Narrative   Not on file   Social Determinants of Health   Financial Resource Strain: Not on file  Food Insecurity: Not on file  Transportation Needs: Not on file  Physical Activity: Not on file  Stress: Not on file  Social Connections: Not on file   Intimate Partner Violence: Not on file    FAMILY HISTORY: Family History  Problem Relation Age of Onset   Diabetes Mother    Heart failure Mother    Hypertension Father    Diabetes Father    Heart disease Father    Heart attack Father     ALLERGIES:  has No Known Allergies.  MEDICATIONS:  Current Outpatient Medications  Medication Sig Dispense Refill   acyclovir (ZOVIRAX) 400 MG tablet Take 1 tablet (400 mg total) by mouth 2 (two) times daily. 60 tablet 2   amLODipine (NORVASC) 10 MG tablet Take 10 mg by mouth daily.     Calcium Citrate-Vitamin D (CALCIUM + D PO) Take 1 tablet by mouth daily.     dicyclomine (BENTYL) 10 MG capsule Take 1 capsule (10 mg total) by mouth 3 (three) times daily as needed for spasms. 90 capsule 0   gemfibrozil (LOPID) 600 MG  tablet Take 600 mg by mouth 2 (two) times daily before a meal.     HYDROcodone-acetaminophen (NORCO/VICODIN) 5-325 MG tablet Take 1 tablet by mouth every 6 (six) hours as needed for moderate pain. 30 tablet 0   levothyroxine (SYNTHROID, LEVOTHROID) 25 MCG tablet Take 25 mcg by mouth daily.      LORazepam (ATIVAN) 0.5 MG tablet Take 0.5 mg by mouth daily as needed for anxiety or sleep.     losartan (COZAAR) 50 MG tablet Take 50 mg by mouth daily.     Multiple Vitamin (MULTIVITAMIN) capsule Take 1 capsule by mouth daily.     ondansetron (ZOFRAN) 8 MG tablet Take 1 tablet (8 mg total) by mouth every 8 (eight) hours as needed. 30 tablet 0   pantoprazole (PROTONIX) 20 MG tablet Take 20 mg by mouth daily.     polyethylene glycol powder (GLYCOLAX/MIRALAX) 17 GM/SCOOP powder Take 17 g by mouth at bedtime.     traMADol (ULTRAM) 50 MG tablet Take 1 tablet (50 mg total) by mouth every 6 (six) hours as needed. (Patient not taking: Reported on 02/28/2022) 30 tablet 0   No current facility-administered medications for this visit.    REVIEW OF SYSTEMS:   Constitutional: ( - ) fevers, ( - )  chills , ( - ) night sweats Eyes: ( - ) blurriness of  vision, ( - ) double vision, ( - ) watery eyes Ears, nose, mouth, throat, and face: ( - ) mucositis, ( - ) sore throat Respiratory: ( - ) cough, ( - ) dyspnea, ( - ) wheezes Cardiovascular: ( - ) palpitation, ( - ) chest discomfort, ( - ) lower extremity swelling Gastrointestinal:  ( - ) nausea, ( - ) heartburn, ( - ) change in bowel habits Skin: ( - ) abnormal skin rashes Lymphatics: ( - ) new lymphadenopathy, ( - ) easy bruising Neurological: (+ ) numbness, ( - ) tingling, ( - ) new weaknesses Behavioral/Psych: ( - ) mood change, ( - ) new changes  All other systems were reviewed with the patient and are negative.  PHYSICAL EXAMINATION: ECOG PERFORMANCE STATUS: 2 - Symptomatic, <50% confined to bed  Vitals:   02/28/22 1445  BP: (!) 151/66  Pulse: 73  Resp: 15  Temp: 97.8 F (36.6 C)  SpO2: 99%    Filed Weights   02/28/22 1445  Weight: 115 lb 4.8 oz (52.3 kg)     GENERAL: Well-appearing elderly Caucasian female, alert, no distress and comfortable SKIN: skin color, texture, turgor are normal, no rashes or significant lesions EYES: conjunctiva are pink and non-injected, sclera clear LUNGS: clear to auscultation and percussion with normal breathing effort HEART: regular rate & rhythm and no murmurs and no lower extremity edema Musculoskeletal: no cyanosis of digits and no clubbing  PSYCH: alert & oriented x 3, fluent speech NEURO: no focal motor/sensory deficits  LABORATORY DATA:  I have reviewed the data as listed    Latest Ref Rng & Units 02/28/2022    2:26 PM 02/14/2022    1:07 PM 02/07/2022   12:30 PM  CBC  WBC 4.0 - 10.5 K/uL 5.3  4.5  4.9   Hemoglobin 12.0 - 15.0 g/dL 9.7  9.4  9.6   Hematocrit 36.0 - 46.0 % 26.9  26.7  28.0   Platelets 150 - 400 K/uL 285  277  233        Latest Ref Rng & Units 02/28/2022    2:26 PM 02/14/2022  1:07 PM 02/07/2022   12:30 PM  CMP  Glucose 70 - 99 mg/dL 115  130  129   BUN 8 - 23 mg/dL '16  19  14   ' Creatinine 0.44 - 1.00 mg/dL  0.73  0.81  0.75   Sodium 135 - 145 mmol/L 131  134  135   Potassium 3.5 - 5.1 mmol/L 4.3  3.9  4.0   Chloride 98 - 111 mmol/L 101  103  104   CO2 22 - 32 mmol/L '20  23  23   ' Calcium 8.9 - 10.3 mg/dL 9.4  9.2  9.5   Total Protein 6.5 - 8.1 g/dL 7.1  7.1  7.4   Total Bilirubin 0.3 - 1.2 mg/dL 0.3  0.3  0.4   Alkaline Phos 38 - 126 U/L 89  89  90   AST 15 - 41 U/L '18  18  18   ' ALT 0 - 44 U/L '7  7  7     ' Lab Results  Component Value Date   MPROTEIN 0.5 (H) 02/14/2022   MPROTEIN 0.7 (H) 01/24/2022   MPROTEIN 0.7 (H) 01/17/2022   Lab Results  Component Value Date   KPAFRELGTCHN 15.4 02/14/2022   KPAFRELGTCHN 14.5 01/24/2022   KPAFRELGTCHN 13.7 01/17/2022   LAMBDASER 11.3 02/14/2022   LAMBDASER 9.1 01/24/2022   LAMBDASER 8.5 01/17/2022   KAPLAMBRATIO 1.36 02/14/2022   KAPLAMBRATIO 1.59 01/24/2022   KAPLAMBRATIO 1.61 01/17/2022   RADIOGRAPHIC STUDIES: VAS [place] LOWER EXTREMITY VENOUS (DVT)  Result Date: 02/08/2022  Lower Venous DVT Study Patient Name:  BOOTS MCGLOWN  Date of Exam:   02/07/2022 Medical Rec #: [id number]        Accession #:    [id number] Date of Birth: 05/31/1931       Patient Gender: F Patient Age:   73 years Exam Location:  [facility] Procedure:      VAS [place] LOWER EXTREMITY VENOUS (DVT) Referring Phys: LINDSAY LEE --------------------------------------------------------------------------------  Indications: Edema.  Risk Factors: Chemotherapy. Comparison Study: No previous exams Performing Technologist: Jody Hill RVT, RDMS  Examination Guidelines: A complete evaluation includes B-mode imaging, spectral Doppler, color Doppler, and power Doppler as needed of all accessible portions of each vessel. Bilateral testing is considered an integral part of a complete examination. Limited examinations for reoccurring indications may be performed as noted. The reflux portion of the exam is performed with the patient in reverse Trendelenburg.   +---------+---------------+---------+-----------+----------+--------------+ RIGHT    CompressibilityPhasicitySpontaneityPropertiesThrombus Aging +---------+---------------+---------+-----------+----------+--------------+ CFV      Full           Yes      Yes                                 +---------+---------------+---------+-----------+----------+--------------+ SFJ      Full                                                        +---------+---------------+---------+-----------+----------+--------------+ FV Prox  Full           Yes      Yes                                 +---------+---------------+---------+-----------+----------+--------------+  FV Mid   Full           Yes      Yes                                 +---------+---------------+---------+-----------+----------+--------------+ FV DistalFull           Yes      Yes                                 +---------+---------------+---------+-----------+----------+--------------+ PFV      Full                                                        +---------+---------------+---------+-----------+----------+--------------+ POP      Full           Yes      Yes                                 +---------+---------------+---------+-----------+----------+--------------+ PTV      Full                                                        +---------+---------------+---------+-----------+----------+--------------+ PERO     Full                                                        +---------+---------------+---------+-----------+----------+--------------+   +---------+---------------+---------+-----------+----------+--------------+ LEFT     CompressibilityPhasicitySpontaneityPropertiesThrombus Aging +---------+---------------+---------+-----------+----------+--------------+ CFV      Full           Yes      Yes                                  +---------+---------------+---------+-----------+----------+--------------+ SFJ      Full                                                        +---------+---------------+---------+-----------+----------+--------------+ FV Prox  Full           Yes      Yes                                 +---------+---------------+---------+-----------+----------+--------------+ FV Mid   Full           Yes      Yes                                 +---------+---------------+---------+-----------+----------+--------------+ FV DistalFull  Yes      Yes                                 +---------+---------------+---------+-----------+----------+--------------+ PFV      Full                                                        +---------+---------------+---------+-----------+----------+--------------+ POP      Full           Yes      Yes                                 +---------+---------------+---------+-----------+----------+--------------+ PTV      Full                                                        +---------+---------------+---------+-----------+----------+--------------+ PERO     Full                                                        +---------+---------------+---------+-----------+----------+--------------+     Summary: BILATERAL: - No evidence of deep vein thrombosis seen in the lower extremities, bilaterally. -No evidence of popliteal cyst, bilaterally. - Subcutaneous edema seen in area of calf and ankle, bilaterally. (L > R)  *See table(s) above for measurements and observations. Electronically signed by Jamelle Haring on 02/08/2022 at 9:42:51 AM.    Final     ASSESSMENT & PLAN [name] 88 y.o. female with medical history significant for newly diagnosed IgG lambda multiple myeloma who presents for a follow up visit.  After review the labs, review the findings, discussion with the patient the findings are most consistent with an IgG lambda  multiple myeloma.  The patient has not bone marrow involvement of 20% with a plasma cell neoplasm as well as concern for lytic lesions, anemia, macrocytosis, and leukopenia.  Given her advanced age would recommend proceeding with Velcade and dexamethasone alone initially.  If she was able to tolerate this well we could consider the addition of Revlimid therapy as well.  The patient voiced understanding of the diagnosis and the treatment plan moving forward.  She was agreeable to Velcade with dexamethasone as initial treatment.  #IgG Lambda Multiple Myeloma -- Diagnosis confirmed by bone marrow biopsy on 09/27/2021 which shows evidence of 20% involvement by plasma cells.  Additionally has anemia, macrocytosis, leukopenia, and concern for lytic lesions on bone imaging --Prognostic panel shows a deletion 17 with a TP53 mutation --Started Cycle 1 Day 1 of Velcade/Dex on 10/18/2021 --assure monthly SPEP, SFLC  and weekly CBC, CMP, and LDH Plan: --Due for Cycle 7 Day 1 of Velcade/Dex today  --Due to persistent neuropathy in fingertips, Velcade was dose reduced from 1.3 mg/m2 to 1 mg/m2. If there is persistent neuropathy, we will discontinue velcade and switch to daratumumab.  --Labs from 02/14/2022 showed M protein improved  to 0.5 with a normal kappa lambda ratio --Labs from today were reviewed and adequate for treatment today --Continue with weekly labs and Velcade/Dex treatments and RTC in 2 weeks for a follow up visit.   # Confusion --decrease dexamethasone to 20 mg on infusion days.   #Leukopenia/Macrocytic anemia/Vitamin B12 deficiency: --Secondary to chemotherapy and multiple myeloma --Today, Hgb 9.7, MCV 96.8, WBC 5.3 --Received vitamin B12 injection 1000 mcg weekly x 4 doses. Will continue now with monthly injections.   #Right rib pain, improved: --CT chest from 10/25/2021 that didn't show any new or progressive disease.   #Abdominal cramping: --Symptoms occur after velcade injection.   --sent prescription for bentyl   #Neuropathy: --Grade 2 involving finger tips --Likely secondary to Velcade. Dose reduced to 1 mg/m2 starting today.  --Monitor closey  #Supportive Care -- chemotherapy education complete -- port placement not required.  -- zofran 58m q8H PRN and compazine 134mPO q6H for nausea -- acyclovir 40084mO BID for VCZ prophylaxis. Patient was not taking medication as prescribed. Advised importance of taking medication consistently.  -- We will start Zometa as soon as is feasible after dental clearance  No orders of the defined types were placed in this encounter.  All questions were answered. The patient knows to call the clinic with any problems, questions or concerns.  I have spent a total of 30 minutes minutes of face-to-face and non-face-to-face time, preparing to see the patient, performing a medically appropriate examination, counseling and educating the patient, ordering medications/tests, documenting clinical information in the electronic health record, and care coordination.   IreDede Query-C [facility]: [phone]/24/2023 11:28 AM

## 2022-03-07 ENCOUNTER — Other Ambulatory Visit: Payer: Self-pay | Admitting: Hematology and Oncology

## 2022-03-07 ENCOUNTER — Inpatient Hospital Stay: Payer: Medicare PPO

## 2022-03-07 ENCOUNTER — Other Ambulatory Visit: Payer: Self-pay

## 2022-03-07 VITALS — BP 134/57 | HR 71 | Temp 98.1°F | Resp 16 | Ht 61.0 in | Wt 112.5 lb

## 2022-03-07 DIAGNOSIS — C9000 Multiple myeloma not having achieved remission: Secondary | ICD-10-CM

## 2022-03-07 DIAGNOSIS — Z5112 Encounter for antineoplastic immunotherapy: Secondary | ICD-10-CM | POA: Diagnosis not present

## 2022-03-07 LAB — CMP (CANCER CENTER ONLY)
ALT: 7 U/L (ref 0–44)
AST: 18 U/L (ref 15–41)
Albumin: 4 g/dL (ref 3.5–5.0)
Alkaline Phosphatase: 90 U/L (ref 38–126)
Anion gap: 7 (ref 5–15)
BUN: 13 mg/dL (ref 8–23)
CO2: 22 mmol/L (ref 22–32)
Calcium: 9 mg/dL (ref 8.9–10.3)
Chloride: 102 mmol/L (ref 98–111)
Creatinine: 0.74 mg/dL (ref 0.44–1.00)
GFR, Estimated: >60 mL/min (ref >60)
Glucose, Bld: 132 mg/dL — ABNORMAL HIGH (ref 70–99)
Potassium: 3.9 mmol/L (ref 3.5–5.1)
Sodium: 131 mmol/L — ABNORMAL LOW (ref 135–145)
Total Bilirubin: 0.3 mg/dL (ref 0.3–1.2)
Total Protein: 6.8 g/dL (ref 6.5–8.1)

## 2022-03-07 LAB — CBC WITH DIFFERENTIAL (CANCER CENTER ONLY)
Abs Immature Granulocytes: 0.01 10*3/uL (ref 0.00–0.07)
Basophils Absolute: 0 10*3/uL (ref 0.0–0.1)
Basophils Relative: 0 %
Eosinophils Absolute: 0.1 10*3/uL (ref 0.0–0.5)
Eosinophils Relative: 1 %
HCT: 27.1 % — ABNORMAL LOW (ref 36.0–46.0)
Hemoglobin: 9.7 g/dL — ABNORMAL LOW (ref 12.0–15.0)
Immature Granulocytes: 0 %
Lymphocytes Relative: 22 %
Lymphs Abs: 0.9 10*3/uL (ref 0.7–4.0)
MCH: 33.4 pg (ref 26.0–34.0)
MCHC: 35.8 g/dL (ref 30.0–36.0)
MCV: 93.4 fL (ref 80.0–100.0)
Monocytes Absolute: 0.6 10*3/uL (ref 0.1–1.0)
Monocytes Relative: 15 %
Neutro Abs: 2.6 10*3/uL (ref 1.7–7.7)
Neutrophils Relative %: 62 %
Platelet Count: 263 10*3/uL (ref 150–400)
RBC: 2.9 MIL/uL — ABNORMAL LOW (ref 3.87–5.11)
RDW: 12.6 % (ref 11.5–15.5)
WBC Count: 4.2 10*3/uL (ref 4.0–10.5)
nRBC: 0 % (ref 0.0–0.2)

## 2022-03-07 LAB — LACTATE DEHYDROGENASE: LDH: 141 U/L (ref 98–192)

## 2022-03-07 MED ORDER — DEXAMETHASONE 4 MG PO TABS
20.0000 mg | ORAL_TABLET | Freq: Once | ORAL | Status: AC
  Administered 2022-03-07: 20 mg via ORAL
  Filled 2022-03-07: qty 5

## 2022-03-07 MED ORDER — BORTEZOMIB CHEMO SQ INJECTION 3.5 MG (2.5MG/ML)
0.7000 mg/m2 | Freq: Once | INTRAMUSCULAR | Status: AC
  Administered 2022-03-07: 1 mg via SUBCUTANEOUS
  Filled 2022-03-07: qty 0.4

## 2022-03-07 NOTE — Patient Instructions (Signed)
[facility]  Discharge Instructions: Thank you for choosing [facility] to provide your oncology and hematology care.   If you have a lab appointment with the [facility], please go directly to the [facility] and check in at the registration area.   Wear comfortable clothing and clothing appropriate for easy access to any Portacath or PICC line.   We strive to give you quality time with your provider. You may need to reschedule your appointment if you arrive late (15 or more minutes).  Arriving late affects you and other patients whose appointments are after yours.  Also, if you miss three or more appointments without notifying the office, you may be dismissed from the clinic at the provider's discretion.      For prescription refill requests, have your pharmacy contact our office and allow 72 hours for refills to be completed.    Today you received the following chemotherapy and/or immunotherapy agents Velcade      To help prevent nausea and vomiting after your treatment, we encourage you to take your nausea medication as directed.  BELOW ARE SYMPTOMS THAT SHOULD BE REPORTED IMMEDIATELY: *FEVER GREATER THAN 100.4 F (38 C) OR HIGHER *CHILLS OR SWEATING *NAUSEA AND VOMITING THAT IS NOT CONTROLLED WITH YOUR NAUSEA MEDICATION *UNUSUAL SHORTNESS OF BREATH *UNUSUAL BRUISING OR BLEEDING *URINARY PROBLEMS (pain or burning when urinating, or frequent urination) *BOWEL PROBLEMS (unusual diarrhea, constipation, pain near the anus) TENDERNESS IN MOUTH AND THROAT WITH OR WITHOUT PRESENCE OF ULCERS (sore throat, sores in mouth, or a toothache) UNUSUAL RASH, SWELLING OR PAIN  UNUSUAL VAGINAL DISCHARGE OR ITCHING   Items with * indicate a potential emergency and should be followed up as soon as possible or go to the Emergency Department if any problems should occur.  Please show the CHEMOTHERAPY ALERT CARD or IMMUNOTHERAPY ALERT CARD at check-in to the  Emergency Department and triage nurse.  Should you have questions after your visit or need to cancel or reschedule your appointment, please contact [facility]  Dept: [phone]  and follow the prompts.  Office hours are 8:00 a.m. to 4:30 p.m. Monday - Friday. Please note that voicemails left after 4:00 p.m. may not be returned until the following business day.  We are closed weekends and major holidays. You have access to a nurse at all times for urgent questions. Please call the main number to the clinic Dept: [phone] and follow the prompts.   For any non-urgent questions, you may also contact your provider using MyChart. We now offer e-Visits for anyone 16 and older to request care online for non-urgent symptoms. For details visit [email or web address].   Also download the MyChart app! Go to the app store, search "MyChart", open the app, select [facility], and log in with your MyChart username and password.  Masks are optional in the cancer centers. If you would like for your care team to wear a mask while they are taking care of you, please let them know. For doctor visits, patients may have with them one support person who is at least 86 years old. At this time, visitors are not allowed in the infusion area.

## 2022-03-10 ENCOUNTER — Telehealth: Payer: Self-pay | Admitting: *Deleted

## 2022-03-10 ENCOUNTER — Other Ambulatory Visit: Payer: Self-pay | Admitting: Hematology and Oncology

## 2022-03-10 MED ORDER — HYDROCODONE-ACETAMINOPHEN 5-325 MG PO TABS: 1.0000 | ORAL_TABLET | Freq: Four times a day (QID) | ORAL | 0 refills | Status: DC | PRN

## 2022-03-10 NOTE — Progress Notes (Signed)
DISCONTINUE ON PATHWAY REGIMEN - Multiple Myeloma and Other Plasma Cell Dyscrasias ? ? ?  A cycle is every 21 days: ?    Bortezomib  ?    Lenalidomide  ?    Dexamethasone  ? ?**Always confirm dose/schedule in your pharmacy ordering system** ? ?REASON: Toxicities / Adverse Event ?PRIOR TREATMENT: [id number]: VRd (Bortezomib 1.3 mg/m2 SUBQ D1, 8, 15 + Lenalidomide 25 mg + Dexamethasone 40 mg) q21 Days x 8 Cycles ?TREATMENT RESPONSE: Partial Response (PR) ? ?START ON PATHWAY REGIMEN - Multiple Myeloma and Other Plasma Cell Dyscrasias ? ? ?  Cycle 1 and 2: A cycle is every 28 days: ?    Lenalidomide  ?    Dexamethasone  ?    Daratumumab  ?  Cycles 3 through 6: A cycle is every 28 days: ?    Lenalidomide  ?    Dexamethasone  ?    Daratumumab  ?  Cycles 7 and beyond: A cycle is every 28 days: ?    Lenalidomide  ?    Dexamethasone  ?    Daratumumab  ? ?**Always confirm dose/schedule in your pharmacy ordering system** ? ?Patient Characteristics: ?Multiple Myeloma, Newly Diagnosed, Transplant Ineligible or Refused, Standard Risk ?Disease Classification: Multiple Myeloma ?R-ISS Staging: II ?Therapeutic Status: Newly Diagnosed ?Is Patient Eligible for Transplant<= Transplant Ineligible or Refused ?Risk Status: Standard Risk ?Intent of Therapy: ?Curative Intent, Discussed with Patient ?

## 2022-03-10 NOTE — Progress Notes (Signed)
ON PATHWAY REGIMEN - Multiple Myeloma and Other Plasma Cell Dyscrasias  No Change  Continue With Treatment as Ordered.  Original Decision Date/Time: 10/08/2021 14:26     A cycle is every 21 days:     Bortezomib      Lenalidomide      Dexamethasone   **Always confirm dose/schedule in your pharmacy ordering system**  Patient Characteristics: Multiple Myeloma, Newly Diagnosed, Transplant Ineligible or Refused, High Risk Disease Classification: Multiple Myeloma R-ISS Staging: II Therapeutic Status: Newly Diagnosed Is Patient Eligible for Transplant<= Transplant Ineligible or Refused Risk Status: High Risk Intent of Therapy: Curative Intent, Discussed with Patient

## 2022-03-10 NOTE — Telephone Encounter (Signed)
Received call from pt requesting refill of her hydrocodone. Last filled 01/20/22 for # 30 tablets. Pt uses [facility] in [place].

## 2022-03-10 NOTE — Progress Notes (Signed)
Pharmacist Chemotherapy Monitoring - Initial Assessment    Anticipated start date: 03/14/22   The following has been reviewed per standard work regarding the patient's treatment regimen: The patient's diagnosis, treatment plan and drug doses, and organ/hematologic function Lab orders and baseline tests specific to treatment regimen  The treatment plan start date, drug sequencing, and pre-medications Prior authorization status  Patient's documented medication list, including drug-drug interaction screen and prescriptions for anti-emetics and supportive care specific to the treatment regimen The drug concentrations, fluid compatibility, administration routes, and timing of the medications to be used The patient's access for treatment and lifetime cumulative dose history, if applicable  The patient's medication allergies and previous infusion related reactions, if applicable   Changes made to treatment plan:  N/A  Follow up needed:  Switching to SQ Dara d/t worsening neuropathy from Velcade.   Need pretx RBC? - notified Dr. Lorenso Courier   Switch Dex 40 IV premed to Dex 20 mg PO; reduce dose given age > 86 y.o. per Dr. Lorenso Courier   PA pending   Kennith Center, Pharm.D., CPP 03/10/2022'@12'$ :30 PM

## 2022-03-13 ENCOUNTER — Encounter: Payer: Self-pay | Admitting: *Deleted

## 2022-03-14 ENCOUNTER — Other Ambulatory Visit: Payer: Self-pay | Admitting: Hematology and Oncology

## 2022-03-14 ENCOUNTER — Inpatient Hospital Stay (HOSPITAL_BASED_OUTPATIENT_CLINIC_OR_DEPARTMENT_OTHER): Payer: Medicare PPO | Admitting: Hematology and Oncology

## 2022-03-14 ENCOUNTER — Inpatient Hospital Stay: Payer: Medicare PPO

## 2022-03-14 ENCOUNTER — Other Ambulatory Visit: Payer: Self-pay

## 2022-03-14 ENCOUNTER — Inpatient Hospital Stay: Payer: Medicare PPO | Attending: Hematology and Oncology

## 2022-03-14 ENCOUNTER — Inpatient Hospital Stay: Payer: Medicare PPO | Admitting: Hematology and Oncology

## 2022-03-14 VITALS — BP 147/64 | HR 77 | Temp 98.0°F | Resp 16 | Wt 114.5 lb

## 2022-03-14 DIAGNOSIS — C9000 Multiple myeloma not having achieved remission: Secondary | ICD-10-CM

## 2022-03-14 DIAGNOSIS — Z5112 Encounter for antineoplastic immunotherapy: Secondary | ICD-10-CM | POA: Insufficient documentation

## 2022-03-14 DIAGNOSIS — R109 Unspecified abdominal pain: Secondary | ICD-10-CM | POA: Insufficient documentation

## 2022-03-14 DIAGNOSIS — D72819 Decreased white blood cell count, unspecified: Secondary | ICD-10-CM | POA: Insufficient documentation

## 2022-03-14 DIAGNOSIS — R410 Disorientation, unspecified: Secondary | ICD-10-CM | POA: Diagnosis not present

## 2022-03-14 DIAGNOSIS — T451X5A Adverse effect of antineoplastic and immunosuppressive drugs, initial encounter: Secondary | ICD-10-CM | POA: Insufficient documentation

## 2022-03-14 DIAGNOSIS — E538 Deficiency of other specified B group vitamins: Secondary | ICD-10-CM | POA: Diagnosis present

## 2022-03-14 DIAGNOSIS — Z800 Family history of malignant neoplasm of digestive organs: Secondary | ICD-10-CM | POA: Insufficient documentation

## 2022-03-14 DIAGNOSIS — G620 Drug-induced polyneuropathy: Secondary | ICD-10-CM | POA: Diagnosis not present

## 2022-03-14 LAB — TYPE AND SCREEN
ABO/RH(D): O POS
Antibody Screen: NEGATIVE

## 2022-03-14 LAB — CMP (CANCER CENTER ONLY)
ALT: 6 U/L (ref 0–44)
AST: 15 U/L (ref 15–41)
Albumin: 4.2 g/dL (ref 3.5–5.0)
Alkaline Phosphatase: 97 U/L (ref 38–126)
Anion gap: 8 (ref 5–15)
BUN: 16 mg/dL (ref 8–23)
CO2: 21 mmol/L — ABNORMAL LOW (ref 22–32)
Calcium: 8.5 mg/dL — ABNORMAL LOW (ref 8.9–10.3)
Chloride: 106 mmol/L (ref 98–111)
Creatinine: 0.74 mg/dL (ref 0.44–1.00)
GFR, Estimated: >60 mL/min (ref >60)
Glucose, Bld: 96 mg/dL (ref 70–99)
Potassium: 3.8 mmol/L (ref 3.5–5.1)
Sodium: 135 mmol/L (ref 135–145)
Total Bilirubin: 0.3 mg/dL (ref 0.3–1.2)
Total Protein: 7.1 g/dL (ref 6.5–8.1)

## 2022-03-14 LAB — CBC WITH DIFFERENTIAL (CANCER CENTER ONLY)
Abs Immature Granulocytes: 0.01 10*3/uL (ref 0.00–0.07)
Basophils Absolute: 0 10*3/uL (ref 0.0–0.1)
Basophils Relative: 1 %
Eosinophils Absolute: 0.1 10*3/uL (ref 0.0–0.5)
Eosinophils Relative: 3 %
HCT: 27.4 % — ABNORMAL LOW (ref 36.0–46.0)
Hemoglobin: 9.7 g/dL — ABNORMAL LOW (ref 12.0–15.0)
Immature Granulocytes: 0 %
Lymphocytes Relative: 19 %
Lymphs Abs: 0.8 10*3/uL (ref 0.7–4.0)
MCH: 34.2 pg — ABNORMAL HIGH (ref 26.0–34.0)
MCHC: 35.4 g/dL (ref 30.0–36.0)
MCV: 96.5 fL (ref 80.0–100.0)
Monocytes Absolute: 0.6 10*3/uL (ref 0.1–1.0)
Monocytes Relative: 14 %
Neutro Abs: 2.6 10*3/uL (ref 1.7–7.7)
Neutrophils Relative %: 63 %
Platelet Count: 261 10*3/uL (ref 150–400)
RBC: 2.84 MIL/uL — ABNORMAL LOW (ref 3.87–5.11)
RDW: 13.1 % (ref 11.5–15.5)
WBC Count: 4.1 10*3/uL (ref 4.0–10.5)
nRBC: 0 % (ref 0.0–0.2)

## 2022-03-14 LAB — LACTATE DEHYDROGENASE: LDH: 134 U/L (ref 98–192)

## 2022-03-14 LAB — PRETREATMENT RBC PHENOTYPE: DAT, IgG: NEGATIVE

## 2022-03-14 MED ORDER — ACETAMINOPHEN 325 MG PO TABS
650.0000 mg | ORAL_TABLET | Freq: Once | ORAL | Status: AC
  Administered 2022-03-14: 650 mg via ORAL
  Filled 2022-03-14: qty 2

## 2022-03-14 MED ORDER — DEXAMETHASONE 4 MG PO TABS
20.0000 mg | ORAL_TABLET | Freq: Once | ORAL | Status: AC
  Administered 2022-03-14: 20 mg via ORAL
  Filled 2022-03-14: qty 5

## 2022-03-14 MED ORDER — DARATUMUMAB-HYALURONIDASE-FIHJ 1800-30000 MG-UT/15ML ~~LOC~~ SOLN
1800.0000 mg | Freq: Once | SUBCUTANEOUS | Status: AC
  Administered 2022-03-14: 1800 mg via SUBCUTANEOUS
  Filled 2022-03-14: qty 15

## 2022-03-14 MED ORDER — DIPHENHYDRAMINE HCL 25 MG PO CAPS
50.0000 mg | ORAL_CAPSULE | Freq: Once | ORAL | Status: AC
  Administered 2022-03-14: 50 mg via ORAL
  Filled 2022-03-14: qty 2

## 2022-03-14 MED ORDER — MONTELUKAST SODIUM 10 MG PO TABS
10.0000 mg | ORAL_TABLET | Freq: Once | ORAL | Status: AC
  Administered 2022-03-14: 10 mg via ORAL
  Filled 2022-03-14: qty 1

## 2022-03-14 NOTE — Progress Notes (Signed)
Patient observed for 2 hours post daratumumab injection. No s/s of adverse reactions noted. Vitals stable. Patient with no complaints.

## 2022-03-14 NOTE — Patient Instructions (Signed)
[facility]ICAL [facility]COL[facility]   Discharge Instructions: Thank you for choosing [facility] to provide your oncology and hematology care.   If you have a lab appointment with the Cancer Center, please go directly to the Cancer Center and check in at the registration area.   Wear comfortable clothing and clothing appropriate for easy access to any Portacath or PICC line.   We strive to give you quality time with your provider. You may need to reschedule your appointment if you arrive late (15 or more minutes).  Arriving late affects you and other patients whose appointments are after yours.  Also, if you miss three or more appointments without notifying the office, you may be dismissed from the clinic at the provider's discretion.      For prescription refill requests, have your pharmacy contact our office and allow 72 hours for refills to be completed.    Today you received the following chemotherapy and/or immunotherapy agents: daratumumab-hyaluronidase-fihj      To help prevent nausea and vomiting after your treatment, we encourage you to take your nausea medication as directed.  BELOW ARE SYMPTOMS THAT SHOULD BE REPORTED IMMEDIATELY: *FEVER GREATER THAN 100.4 F (38 C) OR HIGHER *CHILLS OR SWEATING *NAUSEA AND VOMITING THAT IS NOT CONTROLLED WITH YOUR NAUSEA MEDICATION *UNUSUAL SHORTNESS OF BREATH *UNUSUAL BRUISING OR BLEEDING *URINARY PROBLEMS (pain or burning when urinating, or frequent urination) *BOWEL PROBLEMS (unusual diarrhea, constipation, pain near the anus) TENDERNESS IN MOUTH AND THROAT WITH OR WITHOUT PRESENCE OF ULCERS (sore throat, sores in mouth, or a toothache) UNUSUAL RASH, SWELLING OR PAIN  UNUSUAL VAGINAL DISCHARGE OR ITCHING   Items with * indicate a potential emergency and should be followed up as soon as possible or go to the Emergency Department if any problems should occur.  Please show the CHEMOTHERAPY ALERT CARD or IMMUNOTHERAPY  ALERT CARD at check-in to the Emergency Department and triage nurse.  Should you have questions after your visit or need to cancel or reschedule your appointment, please contact [facility]  Dept: [phone]  and follow the prompts.  Office hours are 8:00 a.m. to 4:30 p.m. Monday - Friday. Please note that voicemails left after 4:00 p.m. may not be returned until the following business day.  We are closed weekends and major holidays. You have access to a nurse at all times for urgent questions. Please call the main number to the clinic Dept: [phone] and follow the prompts.   For any non-urgent questions, you may also contact your provider using MyChart. We now offer e-Visits for anyone 18 and older to request care online for non-urgent symptoms. For details visit [email or web address].   Also download the MyChart app! Go to the app store, search "MyChart", open the app, select [facility], and log in with your MyChart username and password.  Masks are optional in the cancer centers. If you would like for your care team to wear a mask while they are taking care of you, please let them know. You may have one support person who is at least 86 years old accompany you for your appointments. 

## 2022-03-14 NOTE — Progress Notes (Signed)
[facility] Telephone:([phone]) [phone]   Fax:([phone]) [phone]  PROGRESS NOTE  Patient Care Team: Burnard Bunting, MD as PCP - General (Internal Medicine)  Hematological/Oncological History # IgG Lambda Multiple Myeloma 06/04/2021: WBC 5.5, Hgb 11.1, MCV 101.3, Plt 306 07/24/2021: M protein 2.3, IFE shows IgG Lambda monoclonal specificity. Cr 0.9 08/07/2021: Establish care with Dr. Lorenso Courier 09/27/2021: Bmbx showed hypercellular bone marrow with plasma cell neoplasm, increased number of atypical plasma cells averaging 20% of all cells in the aspirate 10/18/2021: Cycle 1 of Velcade/Dexamethasone 11/08/2021: Cycle 2 of Velcade/Dexamethasone 11/29/2021: Cycle 3 of Velcade/Dexamethasone 12/20/2021: Cycle 4 of Velcade/Dexamethasone 01/10/2022: Cycle 5 of Velcade/Dexamethasone 01/31/2022: Cycle 6 of Velcade/Dexamethasone 02/28/2022: Cycle 7 of Velcade/Dexamethasone. Dose reduced velcade due to neuropathy  03/14/2022: Cycle 1 Day 1 of Dara/Dex (velcade d/c due to neuropathy).   Interval History:  [name] Page 88 y.o. female with medical history significant for newly diagnosed IgG lambda multiple myeloma who presents for a follow up visit. The patient's last visit was on 03/10/2022. In the interim, patient has had no major changes in her health. She is due for Cycle 1, Day 1.  On exam today [name] Page is accompanied by her husband.  She reports she continues to have some numbness of her pinky fingers on both sides.  She notices also in her toes and heel.  She reports it is a numbness and not a tingling or pins-and-needles sensation.  She reports otherwise she has been feeling quite good.  Her energy is low and she tires easily.  She reports right now she is a 4 out of 10 in energy.  She has she is eating well and her weight is currently 114 pounds.  She notes that this is improving.  She did have a urinary tract infection 2 weeks ago treated with nitrofurantoin.  It resolved with antibiotic  therapy.  She reports that she is otherwise willing and able to proceed with treatment at this time.. Patient denies any fevers, chills, night sweats, shortness of breath, chest pain or cough.She has no other complaints.  Rest of the 10 point ROS is below  MEDICAL HISTORY:  Past Medical History:  Diagnosis Date   Alopecia 10/28/2016   Anginal pain (HCC)    Arthritis    Cervical spondylosis    C3-4 AND C4-5   Chest pain 07/28/2008   H/O, normal stress nuclear EF 78%   Chronic cough 07/12/2014   Collapsed lung 07/2010   being treated at [facility] - in left lung   Complication of anesthesia    Cough 07/30/2010   Qualifier: Diagnosis of  By: Chase Caller MD, Murali     Cystocele 05/23/2015   Cystocele, midline 05/02/2013   DEEP VEIN THROMBOSIS/PHLEBITIS 07/30/2010   Qualifier: History of  By: Harvest Dark CMA, Jennifer     Dislocation closed, shoulder 02/2013   DVT (deep venous thrombosis) ([place]) 2006   Encounter for therapeutic drug monitoring 10/03/2014   GERD (gastroesophageal reflux disease)    occ   Headache    hx   High cholesterol    History of recurrent UTIs 09/24/2011   HYPERLIPIDEMIA 07/30/2010   Qualifier: Diagnosis of  By: Harvest Dark CMA, Jennifer     Hypertension    HYPERTENSION 07/30/2010   Qualifier: Diagnosis of  By: Harvest Dark CMA, Jennifer     Hypothyroidism    Nasal itching 01/30/2014   Osteopenia 05/23/2015   Peripheral vascular disease ([place]) 2006   dvt's and 50 yrs ago   Pleuritic pain 12/17/2015  PONV (postoperative nausea and vomiting)    Primary localized osteoarthritis of right hip 08/26/2016   Primary osteoarthritis of right hip 08/26/2016   Pulmonary collapse 07/30/2010   Qualifier: Diagnosis of  By: Chase Caller MD, Murali     PULMONARY NODULE 07/30/2010   Qualifier: Diagnosis of  By: Chase Caller MD, Murali     Rash and nonspecific skin eruption 01/02/2015   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   Sarcoidosis    Sarcoidosis of lung ([place])    Vaginal atrophy  09/24/2011   Visual field defect nasal step 01/02/2015    SURGICAL HISTORY: Past Surgical History:  Procedure Laterality Date   BACK SURGERY  2022   EYE SURGERY     FOOT SURGERY Left 06/2005   bunion hammer toe   HEMORRHOID SURGERY  12/1972   INTRAOCULAR LENS INSERTION     right eye 10 /16 and left 06/16/2005   JOINT REPLACEMENT     KYPHOPLASTY N/A 06/27/2021   Procedure: LUMBAR 3 AND LUMBAR  5 KYPHOPLASTY;  Surgeon: Phylliss Bob, MD;  Location: [facility];  Service: Orthopedics;  Laterality: N/A;   KYPHOPLASTY N/A 09/04/2021   Procedure: THORACIC SEVEN, THORACIC EIGHT, THORACIC NINE, THORACIC TEN KYPHOPLASTY;  Surgeon: Phylliss Bob, MD;  Location: [facility];  Service: Orthopedics;  Laterality: N/A;   SKIN BIOPSY  01/2007   basal cell carcinoma removed from right lower leg    TOTAL HIP ARTHROPLASTY  06/2007   left   TOTAL HIP ARTHROPLASTY Right 08/26/2016   Procedure: TOTAL HIP ARTHROPLASTY ANTERIOR APPROACH;  Surgeon: Melrose Nakayama, MD;  Location: [facility];  Service: Orthopedics;  Laterality: Right;   TOTAL KNEE ARTHROPLASTY Right 10/2007   right    SOCIAL HISTORY: Social History   Socioeconomic History   Marital status: Married    Spouse name: Not on file   Number of children: Not on file   Years of education: Not on file   Highest education level: Not on file  Occupational History   Occupation: retired    Fish farm manager: RETIRED  Tobacco Use   Smoking status: Never   Smokeless tobacco: Never  Vaping Use   Vaping Use: Never used  Substance and Sexual Activity   Alcohol use: Not Currently    Comment: BEER/glass of wine A DAY   Drug use: No   Sexual activity: Never  Other Topics Concern   Not on file  Social History Narrative   Not on file   Social Determinants of Health   Financial Resource Strain: Not on file  Food Insecurity: Not on file  Transportation Needs: Not on file  Physical Activity: Not on file  Stress: Not on file  Social Connections: Not on file   Intimate Partner Violence: Not on file    FAMILY HISTORY: Family History  Problem Relation Age of Onset   Diabetes Mother    Heart failure Mother    Hypertension Father    Diabetes Father    Heart disease Father    Heart attack Father     ALLERGIES:  has No Known Allergies.  MEDICATIONS:  Current Outpatient Medications  Medication Sig Dispense Refill   acyclovir (ZOVIRAX) 400 MG tablet Take 1 tablet (400 mg total) by mouth 2 (two) times daily. 60 tablet 2   amLODipine (NORVASC) 10 MG tablet Take 10 mg by mouth daily.     Calcium Citrate-Vitamin D (CALCIUM + D PO) Take 1 tablet by mouth daily.     dicyclomine (BENTYL) 10 MG capsule Take 1 capsule (  10 mg total) by mouth 3 (three) times daily as needed for spasms. 90 capsule 0   gemfibrozil (LOPID) 600 MG tablet Take 600 mg by mouth 2 (two) times daily before a meal.     HYDROcodone-acetaminophen (NORCO/VICODIN) 5-325 MG tablet Take 1 tablet by mouth every 6 (six) hours as needed for moderate pain. 30 tablet 0   levothyroxine (SYNTHROID, LEVOTHROID) 25 MCG tablet Take 25 mcg by mouth daily.      LORazepam (ATIVAN) 0.5 MG tablet Take 0.5 mg by mouth daily as needed for anxiety or sleep.     losartan (COZAAR) 50 MG tablet Take 50 mg by mouth daily.     Multiple Vitamin (MULTIVITAMIN) capsule Take 1 capsule by mouth daily.     ondansetron (ZOFRAN) 8 MG tablet Take 1 tablet (8 mg total) by mouth every 8 (eight) hours as needed. 30 tablet 0   pantoprazole (PROTONIX) 20 MG tablet Take 20 mg by mouth daily.     polyethylene glycol powder (GLYCOLAX/MIRALAX) 17 GM/SCOOP powder Take 17 g by mouth at bedtime.     No current facility-administered medications for this visit.    REVIEW OF SYSTEMS:   Constitutional: ( - ) fevers, ( - )  chills , ( - ) night sweats Eyes: ( - ) blurriness of vision, ( - ) double vision, ( - ) watery eyes Ears, nose, mouth, throat, and face: ( - ) mucositis, ( - ) sore throat Respiratory: ( - ) cough, ( - )  dyspnea, ( - ) wheezes Cardiovascular: ( - ) palpitation, ( - ) chest discomfort, ( - ) lower extremity swelling Gastrointestinal:  ( - ) nausea, ( - ) heartburn, ( - ) change in bowel habits Skin: ( - ) abnormal skin rashes Lymphatics: ( - ) new lymphadenopathy, ( - ) easy bruising Neurological: (+ ) numbness, ( - ) tingling, ( - ) new weaknesses Behavioral/Psych: ( - ) mood change, ( - ) new changes  All other systems were reviewed with the patient and are negative.  PHYSICAL EXAMINATION: ECOG PERFORMANCE STATUS: 2 - Symptomatic, <50% confined to bed  There were no vitals filed for this visit. There were no vitals filed for this visit.  GENERAL: Well-appearing elderly Caucasian female, alert, no distress and comfortable SKIN: skin color, texture, turgor are normal, no rashes or significant lesions EYES: conjunctiva are pink and non-injected, sclera clear LUNGS: clear to auscultation and percussion with normal breathing effort HEART: regular rate & rhythm and no murmurs and no lower extremity edema Musculoskeletal: no cyanosis of digits and no clubbing  PSYCH: alert & oriented x 3, fluent speech NEURO: no focal motor/sensory deficits  LABORATORY DATA:  I have reviewed the data as listed    Latest Ref Rng & Units 03/14/2022    9:46 AM 03/07/2022    1:36 PM 02/28/2022    2:26 PM  CBC  WBC 4.0 - 10.5 K/uL 4.1  4.2  5.3   Hemoglobin 12.0 - 15.0 g/dL 9.7  9.7  9.7   Hematocrit 36.0 - 46.0 % 27.4  27.1  26.9   Platelets 150 - 400 K/uL 261  263  285        Latest Ref Rng & Units 03/14/2022    9:46 AM 03/07/2022    1:36 PM 02/28/2022    2:26 PM  CMP  Glucose 70 - 99 mg/dL 96  132  115   BUN 8 - 23 mg/dL 16  13  16  Creatinine 0.44 - 1.00 mg/dL 0.74  0.74  0.73   Sodium 135 - 145 mmol/L 135  131  131   Potassium 3.5 - 5.1 mmol/L 3.8  3.9  4.3   Chloride 98 - 111 mmol/L 106  102  101   CO2 22 - 32 mmol/L _0 Calcium 8.9 - 10.3 mg/dL 8.5  9.0  9.4   Total Protein 6.5 - 8.1  g/dL 7.1  6.8  7.1   Total Bilirubin 0.3 - 1.2 mg/dL 0.3  0.3  0.3   Alkaline Phos 38 - 126 U/L 97  90  89   AST 15 - 41 U/L _1 ALT 0 - 44 U/L _2 Lab Results  Component Value Date   MPROTEIN 0.5 (H) 02/14/2022   MPROTEIN 0.7 (H) 01/24/2022   MPROTEIN 0.7 (H) 01/17/2022   Lab Results  Component Value Date   KPAFRELGTCHN 15.4 02/14/2022   KPAFRELGTCHN 14.5 01/24/2022   KPAFRELGTCHN 13.7 01/17/2022   LAMBDASER 11.3 02/14/2022   LAMBDASER 9.1 01/24/2022   LAMBDASER 8.5 01/17/2022   KAPLAMBRATIO 1.36 02/14/2022   KAPLAMBRATIO 1.59 01/24/2022   KAPLAMBRATIO 1.61 01/17/2022   RADIOGRAPHIC STUDIES: No results found.  ASSESSMENT & PLAN [name] Page 88 y.o. female with medical history significant for newly diagnosed IgG lambda multiple myeloma who presents for a follow up visit.  After review the labs, review the findings, discussion with the patient the findings are most consistent with an IgG lambda multiple myeloma.  The patient has not bone marrow involvement of 20% with a plasma cell neoplasm as well as concern for lytic lesions, anemia, macrocytosis, and leukopenia.  Given her advanced age would recommend proceeding with Velcade and dexamethasone alone initially.  If she was able to tolerate this well we could consider the addition of Revlimid therapy as well.  The patient voiced understanding of the diagnosis and the treatment plan moving forward.  She was agreeable to Velcade with dexamethasone as initial treatment.  #IgG Lambda Multiple Myeloma -- Diagnosis confirmed by bone marrow biopsy on 09/27/2021 which shows evidence of 20% involvement by plasma cells.  Additionally has anemia, macrocytosis, leukopenia, and concern for lytic lesions on bone imaging --Prognostic panel shows a deletion 17 with a TP53 mutation --Started Cycle 1 Day 1 of Velcade/Dex on 10/18/2021 --assure monthly SPEP, SFLC  and weekly CBC, CMP, and LDH Plan: --Due for Cycle 1 Day 1 of  Dara/Dex today  --Due to persistent neuropathy in fingertips, Velcade was HELD, we discontinued velcade and switched to daratumumab. She completed approximately 7 cycles of velcade.  --Labs from 02/14/2022 showed M protein improved to 0.5 with a normal kappa lambda ratio --Labs today show white blood cell count 4.1, hemoglobin 9.7, MCV 96.5, and platelets of 261. Cr 0.74.  --Continue with weekly labs and Dara/Dex treatments and RTC in 2 weeks for a follow up visit.   # Confusion --decrease dexamethasone to 20 mg on infusion days.   #Leukopenia/Macrocytic anemia/Vitamin B12 deficiency: --Secondary to chemotherapy and multiple myeloma --Today, hemoglobin 9.7, MCV 96.5 --Received vitamin B12 injection 1000 mcg weekly x 4 doses. Will continue now with monthly injections.   #Right rib pain, improved: --CT chest from 10/25/2021 that didn't show any new or progressive disease.   #Abdominal cramping: --Symptoms occur after velcade injection.  --sent prescription for bentyl   #Neuropathy: --Grade 2 involving finger tips --Likely secondary to Velcade.  Dose reduced to 1 mg/m2 starting today.  --Monitor closey  #Supportive Care -- chemotherapy education complete -- port placement not required.  -- zofran 95m q8H PRN and compazine 127mPO q6H for nausea -- acyclovir 40068mO BID for VCZ prophylaxis. Patient was not taking medication as prescribed. Advised importance of taking medication consistently.  -- We will start Zometa as soon as is feasible after dental clearance  No orders of the defined types were placed in this encounter.  All questions were answered. The patient knows to call the clinic with any problems, questions or concerns.  I have spent a total of 30 minutes minutes of face-to-face and non-face-to-face time, preparing to see the patient, performing a medically appropriate examination, counseling and educating the patient, ordering medications/tests, documenting clinical  information in the electronic health record, and care coordination.   JohLedell PeoplesD Department of Hematology/Oncology [facility]: [phone]: [phone]: [email or web address]  03/14/2022 5:18 PM

## 2022-03-15 LAB — BETA 2 MICROGLOBULIN, SERUM: Beta-2 Microglobulin: 2.3 mg/L (ref 0.6–2.4)

## 2022-03-17 LAB — KAPPA/LAMBDA LIGHT CHAINS
Kappa free light chain: 13.7 mg/L (ref 3.3–19.4)
Kappa, lambda light chain ratio: 1.4 (ref 0.26–1.65)
Lambda free light chains: 9.8 mg/L (ref 5.7–26.3)

## 2022-03-18 ENCOUNTER — Other Ambulatory Visit: Payer: Self-pay

## 2022-03-19 LAB — MULTIPLE MYELOMA PANEL, SERUM
Albumin SerPl Elph-Mcnc: 3.6 g/dL (ref 2.9–4.4)
Albumin/Glob SerPl: 1.4 (ref 0.7–1.7)
Alpha 1: 0.3 g/dL (ref 0.0–0.4)
Alpha2 Glob SerPl Elph-Mcnc: 0.8 g/dL (ref 0.4–1.0)
B-Globulin SerPl Elph-Mcnc: 1.4 g/dL — ABNORMAL HIGH (ref 0.7–1.3)
Gamma Glob SerPl Elph-Mcnc: 0.3 g/dL — ABNORMAL LOW (ref 0.4–1.8)
Globulin, Total: 2.7 g/dL (ref 2.2–3.9)
IgA: 13 mg/dL — ABNORMAL LOW (ref 64–422)
IgG (Immunoglobin G), Serum: 719 mg/dL (ref 586–1602)
IgM (Immunoglobulin M), Srm: 21 mg/dL — ABNORMAL LOW (ref 26–217)
M Protein SerPl Elph-Mcnc: 0.5 g/dL — ABNORMAL HIGH
Total Protein ELP: 6.3 g/dL (ref 6.0–8.5)

## 2022-03-21 ENCOUNTER — Inpatient Hospital Stay: Payer: Medicare PPO

## 2022-03-21 ENCOUNTER — Other Ambulatory Visit: Payer: Self-pay

## 2022-03-21 VITALS — BP 141/62 | HR 69 | Temp 97.8°F | Resp 18 | Wt 112.5 lb

## 2022-03-21 DIAGNOSIS — C9000 Multiple myeloma not having achieved remission: Secondary | ICD-10-CM

## 2022-03-21 DIAGNOSIS — Z5112 Encounter for antineoplastic immunotherapy: Secondary | ICD-10-CM | POA: Diagnosis not present

## 2022-03-21 LAB — CBC WITH DIFFERENTIAL (CANCER CENTER ONLY)
Abs Immature Granulocytes: 0.01 10*3/uL (ref 0.00–0.07)
Basophils Absolute: 0 10*3/uL (ref 0.0–0.1)
Basophils Relative: 1 %
Eosinophils Absolute: 0.1 10*3/uL (ref 0.0–0.5)
Eosinophils Relative: 2 %
HCT: 27.1 % — ABNORMAL LOW (ref 36.0–46.0)
Hemoglobin: 9.5 g/dL — ABNORMAL LOW (ref 12.0–15.0)
Immature Granulocytes: 0 %
Lymphocytes Relative: 20 %
Lymphs Abs: 0.7 10*3/uL (ref 0.7–4.0)
MCH: 33 pg (ref 26.0–34.0)
MCHC: 35.1 g/dL (ref 30.0–36.0)
MCV: 94.1 fL (ref 80.0–100.0)
Monocytes Absolute: 0.5 10*3/uL (ref 0.1–1.0)
Monocytes Relative: 15 %
Neutro Abs: 2.2 10*3/uL (ref 1.7–7.7)
Neutrophils Relative %: 62 %
Platelet Count: 239 10*3/uL (ref 150–400)
RBC: 2.88 MIL/uL — ABNORMAL LOW (ref 3.87–5.11)
RDW: 12.6 % (ref 11.5–15.5)
WBC Count: 3.5 10*3/uL — ABNORMAL LOW (ref 4.0–10.5)
nRBC: 0 % (ref 0.0–0.2)

## 2022-03-21 LAB — CMP (CANCER CENTER ONLY)
ALT: 6 U/L (ref 0–44)
AST: 14 U/L — ABNORMAL LOW (ref 15–41)
Albumin: 4.1 g/dL (ref 3.5–5.0)
Alkaline Phosphatase: 85 U/L (ref 38–126)
Anion gap: 7 (ref 5–15)
BUN: 16 mg/dL (ref 8–23)
CO2: 22 mmol/L (ref 22–32)
Calcium: 9 mg/dL (ref 8.9–10.3)
Chloride: 104 mmol/L (ref 98–111)
Creatinine: 0.8 mg/dL (ref 0.44–1.00)
GFR, Estimated: >60 mL/min (ref >60)
Glucose, Bld: 103 mg/dL — ABNORMAL HIGH (ref 70–99)
Potassium: 4.7 mmol/L (ref 3.5–5.1)
Sodium: 133 mmol/L — ABNORMAL LOW (ref 135–145)
Total Bilirubin: 0.3 mg/dL (ref 0.3–1.2)
Total Protein: 6.7 g/dL (ref 6.5–8.1)

## 2022-03-21 LAB — LACTATE DEHYDROGENASE: LDH: 127 U/L (ref 98–192)

## 2022-03-21 MED ORDER — DARATUMUMAB-HYALURONIDASE-FIHJ 1800-30000 MG-UT/15ML ~~LOC~~ SOLN
1800.0000 mg | Freq: Once | SUBCUTANEOUS | Status: AC
  Administered 2022-03-21: 1800 mg via SUBCUTANEOUS
  Filled 2022-03-21: qty 15

## 2022-03-21 MED ORDER — MONTELUKAST SODIUM 10 MG PO TABS
10.0000 mg | ORAL_TABLET | Freq: Once | ORAL | Status: AC
  Administered 2022-03-21: 10 mg via ORAL

## 2022-03-21 MED ORDER — DIPHENHYDRAMINE HCL 25 MG PO CAPS
50.0000 mg | ORAL_CAPSULE | Freq: Once | ORAL | Status: AC
  Administered 2022-03-21: 50 mg via ORAL

## 2022-03-21 MED ORDER — DEXAMETHASONE 4 MG PO TABS
20.0000 mg | ORAL_TABLET | Freq: Once | ORAL | Status: AC
  Administered 2022-03-21: 20 mg via ORAL

## 2022-03-21 MED ORDER — ACETAMINOPHEN 325 MG PO TABS
650.0000 mg | ORAL_TABLET | Freq: Once | ORAL | Status: AC
  Administered 2022-03-21: 650 mg via ORAL

## 2022-03-21 NOTE — Patient Instructions (Signed)
[facility]COL[facility]   Discharge Instructions: Thank you for choosing [facility] to provide your oncology and hematology care.   If you have a lab appointment with the Cancer Center, please go directly to the Cancer Center and check in at the registration area.   Wear comfortable clothing and clothing appropriate for easy access to any Portacath or PICC line.   We strive to give you quality time with your provider. You may need to reschedule your appointment if you arrive late (15 or more minutes).  Arriving late affects you and other patients whose appointments are after yours.  Also, if you miss three or more appointments without notifying the office, you may be dismissed from the clinic at the provider's discretion.      For prescription refill requests, have your pharmacy contact our office and allow 72 hours for refills to be completed.    Today you received the following chemotherapy and/or immunotherapy agents: daratumumab-hyaluronidase-fihj      To help prevent nausea and vomiting after your treatment, we encourage you to take your nausea medication as directed.  BELOW ARE SYMPTOMS THAT SHOULD BE REPORTED IMMEDIATELY: *FEVER GREATER THAN 100.4 F (38 C) OR HIGHER *CHILLS OR SWEATING *NAUSEA AND VOMITING THAT IS NOT CONTROLLED WITH YOUR NAUSEA MEDICATION *UNUSUAL SHORTNESS OF BREATH *UNUSUAL BRUISING OR BLEEDING *URINARY PROBLEMS (pain or burning when urinating, or frequent urination) *BOWEL PROBLEMS (unusual diarrhea, constipation, pain near the anus) TENDERNESS IN MOUTH AND THROAT WITH OR WITHOUT PRESENCE OF ULCERS (sore throat, sores in mouth, or a toothache) UNUSUAL RASH, SWELLING OR PAIN  UNUSUAL VAGINAL DISCHARGE OR ITCHING   Items with * indicate a potential emergency and should be followed up as soon as possible or go to the Emergency Department if any problems should occur.  Please show the CHEMOTHERAPY ALERT CARD or IMMUNOTHERAPY  ALERT CARD at check-in to the Emergency Department and triage nurse.  Should you have questions after your visit or need to cancel or reschedule your appointment, please contact [facility]  Dept: [phone]  and follow the prompts.  Office hours are 8:00 a.m. to 4:30 p.m. Monday - Friday. Please note that voicemails left after 4:00 p.m. may not be returned until the following business day.  We are closed weekends and major holidays. You have access to a nurse at all times for urgent questions. Please call the main number to the clinic Dept: [phone] and follow the prompts.   For any non-urgent questions, you may also contact your provider using MyChart. We now offer e-Visits for anyone 18 and older to request care online for non-urgent symptoms. For details visit [email or web address].   Also download the MyChart app! Go to the app store, search "MyChart", open the app, select , and log in with your MyChart username and password.  Masks are optional in the cancer centers. If you would like for your care team to wear a mask while they are taking care of you, please let them know. You may have one support Othello Sgroi who is at least 86 years old accompany you for your appointments. 

## 2022-03-21 NOTE — Progress Notes (Signed)
Patient observed for one hour after 2nd dose of darzalex. VSS, no signs of distress noted upon discharge.

## 2022-03-26 ENCOUNTER — Telehealth: Payer: Self-pay

## 2022-03-26 NOTE — Telephone Encounter (Signed)
Mr. Murley called to report that wife has had increased fatigue since starting monoclonal antibody. Negative for any other symptoms.  Explained that this can be a normal response to starting this new tx. Encouraged Mr. Belitz to call clinic if he has any further concerns or she presents with any additional symptoms.   He voiced that he agrees with plan.

## 2022-03-27 ENCOUNTER — Other Ambulatory Visit: Payer: Self-pay | Admitting: Hematology and Oncology

## 2022-03-27 DIAGNOSIS — C9000 Multiple myeloma not having achieved remission: Secondary | ICD-10-CM

## 2022-03-28 ENCOUNTER — Inpatient Hospital Stay: Payer: Medicare PPO

## 2022-03-28 ENCOUNTER — Inpatient Hospital Stay: Payer: Medicare PPO | Admitting: Physician Assistant

## 2022-03-28 ENCOUNTER — Other Ambulatory Visit: Payer: Self-pay

## 2022-03-28 VITALS — BP 166/66 | HR 78 | Temp 97.7°F | Resp 15 | Wt 112.8 lb

## 2022-03-28 DIAGNOSIS — C9000 Multiple myeloma not having achieved remission: Secondary | ICD-10-CM

## 2022-03-28 DIAGNOSIS — Z5112 Encounter for antineoplastic immunotherapy: Secondary | ICD-10-CM | POA: Diagnosis not present

## 2022-03-28 DIAGNOSIS — G629 Polyneuropathy, unspecified: Secondary | ICD-10-CM

## 2022-03-28 LAB — CMP (CANCER CENTER ONLY)
ALT: 9 U/L (ref 0–44)
AST: 17 U/L (ref 15–41)
Albumin: 3.9 g/dL (ref 3.5–5.0)
Alkaline Phosphatase: 82 U/L (ref 38–126)
Anion gap: 8 (ref 5–15)
BUN: 17 mg/dL (ref 8–23)
CO2: 21 mmol/L — ABNORMAL LOW (ref 22–32)
Calcium: 9 mg/dL (ref 8.9–10.3)
Chloride: 104 mmol/L (ref 98–111)
Creatinine: 0.82 mg/dL (ref 0.44–1.00)
GFR, Estimated: >60 mL/min (ref >60)
Glucose, Bld: 108 mg/dL — ABNORMAL HIGH (ref 70–99)
Potassium: 4.5 mmol/L (ref 3.5–5.1)
Sodium: 133 mmol/L — ABNORMAL LOW (ref 135–145)
Total Bilirubin: 0.4 mg/dL (ref 0.3–1.2)
Total Protein: 6.8 g/dL (ref 6.5–8.1)

## 2022-03-28 LAB — CBC WITH DIFFERENTIAL (CANCER CENTER ONLY)
Abs Immature Granulocytes: 0.01 10*3/uL (ref 0.00–0.07)
Basophils Absolute: 0 10*3/uL (ref 0.0–0.1)
Basophils Relative: 1 %
Eosinophils Absolute: 0 10*3/uL (ref 0.0–0.5)
Eosinophils Relative: 1 %
HCT: 28.1 % — ABNORMAL LOW (ref 36.0–46.0)
Hemoglobin: 9.9 g/dL — ABNORMAL LOW (ref 12.0–15.0)
Immature Granulocytes: 0 %
Lymphocytes Relative: 16 %
Lymphs Abs: 0.7 10*3/uL (ref 0.7–4.0)
MCH: 32.9 pg (ref 26.0–34.0)
MCHC: 35.2 g/dL (ref 30.0–36.0)
MCV: 93.4 fL (ref 80.0–100.0)
Monocytes Absolute: 0.4 10*3/uL (ref 0.1–1.0)
Monocytes Relative: 11 %
Neutro Abs: 3 10*3/uL (ref 1.7–7.7)
Neutrophils Relative %: 71 %
Platelet Count: 253 10*3/uL (ref 150–400)
RBC: 3.01 MIL/uL — ABNORMAL LOW (ref 3.87–5.11)
RDW: 12.5 % (ref 11.5–15.5)
WBC Count: 4.2 10*3/uL (ref 4.0–10.5)
nRBC: 0 % (ref 0.0–0.2)

## 2022-03-28 LAB — LACTATE DEHYDROGENASE: LDH: 122 U/L (ref 98–192)

## 2022-03-28 MED ORDER — ACETAMINOPHEN 325 MG PO TABS
650.0000 mg | ORAL_TABLET | Freq: Once | ORAL | Status: AC
  Administered 2022-03-28: 650 mg via ORAL
  Filled 2022-03-28: qty 2

## 2022-03-28 MED ORDER — DARATUMUMAB-HYALURONIDASE-FIHJ 1800-30000 MG-UT/15ML ~~LOC~~ SOLN
1800.0000 mg | Freq: Once | SUBCUTANEOUS | Status: AC
  Administered 2022-03-28: 1800 mg via SUBCUTANEOUS
  Filled 2022-03-28: qty 15

## 2022-03-28 MED ORDER — DIPHENHYDRAMINE HCL 25 MG PO CAPS
50.0000 mg | ORAL_CAPSULE | Freq: Once | ORAL | Status: AC
  Administered 2022-03-28: 50 mg via ORAL
  Filled 2022-03-28: qty 2

## 2022-03-28 MED ORDER — MONTELUKAST SODIUM 10 MG PO TABS
10.0000 mg | ORAL_TABLET | Freq: Once | ORAL | Status: AC
  Administered 2022-03-28: 10 mg via ORAL
  Filled 2022-03-28: qty 1

## 2022-03-28 MED ORDER — DEXAMETHASONE 4 MG PO TABS
20.0000 mg | ORAL_TABLET | Freq: Once | ORAL | Status: AC
  Administered 2022-03-28: 20 mg via ORAL
  Filled 2022-03-28: qty 5

## 2022-03-28 NOTE — Patient Instructions (Signed)
[facility] MEDICAL [facility]COLOGY  Discharge Instructions: Thank you for choosing [facility] to provide your oncology and hematology care.   If you have a lab appointment with the Cancer Center, please go directly to the Cancer Center and check in at the registration area.   Wear comfortable clothing and clothing appropriate for easy access to any Portacath or PICC line.   We strive to give you quality time with your provider. You may need to reschedule your appointment if you arrive late (15 or more minutes).  Arriving late affects you and other patients whose appointments are after yours.  Also, if you miss three or more appointments without notifying the office, you may be dismissed from the clinic at the provider's discretion.      For prescription refill requests, have your pharmacy contact our office and allow 72 hours for refills to be completed.    Today you received the following chemotherapy and/or immunotherapy agents darzalex faspro      To help prevent nausea and vomiting after your treatment, we encourage you to take your nausea medication as directed.  BELOW ARE SYMPTOMS THAT SHOULD BE REPORTED IMMEDIATELY: *FEVER GREATER THAN 100.4 F (38 C) OR HIGHER *CHILLS OR SWEATING *NAUSEA AND VOMITING THAT IS NOT CONTROLLED WITH YOUR NAUSEA MEDICATION *UNUSUAL SHORTNESS OF BREATH *UNUSUAL BRUISING OR BLEEDING *URINARY PROBLEMS (pain or burning when urinating, or frequent urination) *BOWEL PROBLEMS (unusual diarrhea, constipation, pain near the anus) TENDERNESS IN MOUTH AND THROAT WITH OR WITHOUT PRESENCE OF ULCERS (sore throat, sores in mouth, or a toothache) UNUSUAL RASH, SWELLING OR PAIN  UNUSUAL VAGINAL DISCHARGE OR ITCHING   Items with * indicate a potential emergency and should be followed up as soon as possible or go to the Emergency Department if any problems should occur.  Please show the CHEMOTHERAPY ALERT CARD or IMMUNOTHERAPY ALERT CARD at  check-in to the Emergency Department and triage nurse.  Should you have questions after your visit or need to cancel or reschedule your appointment, please contact [facility]  Dept: [phone]  and follow the prompts.  Office hours are 8:00 a.m. to 4:30 p.m. Monday - Friday. Please note that voicemails left after 4:00 p.m. may not be returned until the following business day.  We are closed weekends and major holidays. You have access to a nurse at all times for urgent questions. Please call the main number to the clinic Dept: [phone] and follow the prompts.   For any non-urgent questions, you may also contact your provider using MyChart. We now offer e-Visits for anyone 18 and older to request care online for non-urgent symptoms. For details visit [email or web address].   Also download the MyChart app! Go to the app store, search "MyChart", open the app, select [facility], and log in with your MyChart username and password.  Masks are optional in the cancer centers. If you would like for your care team to wear a mask while they are taking care of you, please let them know. You may have one support person who is at least 86 years old accompany you for your appointments. 

## 2022-03-28 NOTE — Progress Notes (Signed)
Patient declined to stay for observation period post-darzalex faspro. She tolerated the first two injections well.

## 2022-03-29 ENCOUNTER — Encounter: Payer: Self-pay | Admitting: Hematology and Oncology

## 2022-03-29 MED ORDER — PREGABALIN 25 MG PO CAPS
25.0000 mg | ORAL_CAPSULE | Freq: Every day | ORAL | 0 refills | Status: DC
Start: 2022-03-29 — End: 2022-04-21

## 2022-03-29 NOTE — Progress Notes (Signed)
[facility] Telephone:(336) [phone]   [phone]  PROGRESS NOTE  Patient Care Team: Burnard Bunting, MD as PCP - General (Internal Medicine)  Hematological/Oncological History # IgG Lambda Multiple Myeloma 06/04/2021: WBC 5.5, Hgb 11.1, MCV 101.3, Plt 306 07/24/2021: M protein 2.3, IFE shows IgG Lambda monoclonal specificity. Cr 0.9 08/07/2021: Establish care with Dr. Lorenso Courier 09/27/2021: Bmbx showed hypercellular bone marrow with plasma cell neoplasm, increased number of atypical plasma cells averaging 20% of all cells in the aspirate 10/18/2021: Cycle 1 of Velcade/Dexamethasone 11/08/2021: Cycle 2 of Velcade/Dexamethasone 11/29/2021: Cycle 3 of Velcade/Dexamethasone 12/20/2021: Cycle 4 of Velcade/Dexamethasone 01/10/2022: Cycle 5 of Velcade/Dexamethasone 01/31/2022: Cycle 6 of Velcade/Dexamethasone 02/28/2022: Cycle 7 of Velcade/Dexamethasone. Dose reduced velcade due to neuropathy  03/14/2022: Cycle 1 Day 1 of Dara/Dex (velcade d/c due to neuropathy).   Interval History:  [name] Page 88 y.o. female with medical history significant for newly diagnosed IgG lambda multiple myeloma who presents for a follow up visit. The patient's last visit was on 03/14/2022. In the interim, patient has had no major changes in her health. She is due for Cycle 1, Day 15.  On exam today [name] Page is accompanied by her husband.  She reports her neuropathy has worsened since the last visit. She reports pins and needles sensation in the hands and feet. She adds that her balance has been affected because of the neuropathy. She reports feeling fatigued for a few days after the first daratumumab treatment. She reports having a good appetite and eats 3 meals per day. She has lost 3 lbs since 02/28/2022. She denies nausea, vomiting or abdominal pain. Her bowel habits are unchanged without any recurrent episodes of diarrhea or constipation. She denies any bruising or active bleeding. Patient denies  any fevers, chills, night sweats, shortness of breath, chest pain or cough.She has no other complaints.  Rest of the 10 point ROS is below  MEDICAL HISTORY:  Past Medical History:  Diagnosis Date   Alopecia 10/28/2016   Anginal pain (HCC)    Arthritis    Cervical spondylosis    C3-4 AND C4-5   Chest pain 07/28/2008   H/O, normal stress nuclear EF 78%   Chronic cough 07/12/2014   Collapsed lung 07/2010   being treated at [facility] - in left lung   Complication of anesthesia    Cough 07/30/2010   Qualifier: Diagnosis of  By: Chase Caller MD, Murali     Cystocele 05/23/2015   Cystocele, midline 05/02/2013   DEEP VEIN THROMBOSIS/PHLEBITIS 07/30/2010   Qualifier: History of  By: Harvest Dark CMA, Jennifer     Dislocation closed, shoulder 02/2013   DVT (deep venous thrombosis) ([place]) 2006   Encounter for therapeutic drug monitoring 10/03/2014   GERD (gastroesophageal reflux disease)    occ   Headache    hx   High cholesterol    History of recurrent UTIs 09/24/2011   HYPERLIPIDEMIA 07/30/2010   Qualifier: Diagnosis of  By: Harvest Dark CMA, Jennifer     Hypertension    HYPERTENSION 07/30/2010   Qualifier: Diagnosis of  By: Harvest Dark CMA, Jennifer     Hypothyroidism    Nasal itching 01/30/2014   Osteopenia 05/23/2015   Peripheral vascular disease ([place]) 2006   dvt's and 50 yrs ago   Pleuritic pain 12/17/2015   PONV (postoperative nausea and vomiting)    Primary localized osteoarthritis of right hip 08/26/2016   Primary osteoarthritis of right hip 08/26/2016   Pulmonary collapse 07/30/2010   Qualifier: Diagnosis of  By: Chase Caller  MD, Wright     PULMONARY NODULE 07/30/2010   Qualifier: Diagnosis of  By: Chase Caller MD, Murali     Rash and nonspecific skin eruption 01/02/2015   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   Sarcoidosis    Sarcoidosis of lung ([place])    Vaginal atrophy 09/24/2011   Visual field defect nasal step 01/02/2015    SURGICAL HISTORY: Past Surgical History:  Procedure  Laterality Date   BACK SURGERY  2022   EYE SURGERY     FOOT SURGERY Left 06/2005   bunion hammer toe   HEMORRHOID SURGERY  12/1972   INTRAOCULAR LENS INSERTION     right eye 10 /16 and left 06/16/2005   JOINT REPLACEMENT     KYPHOPLASTY N/A 06/27/2021   Procedure: LUMBAR 3 AND LUMBAR  5 KYPHOPLASTY;  Surgeon: Phylliss Bob, MD;  Location: [facility];  Service: Orthopedics;  Laterality: N/A;   KYPHOPLASTY N/A 09/04/2021   Procedure: THORACIC SEVEN, THORACIC EIGHT, THORACIC NINE, THORACIC TEN KYPHOPLASTY;  Surgeon: Phylliss Bob, MD;  Location: [facility];  Service: Orthopedics;  Laterality: N/A;   SKIN BIOPSY  01/2007   basal cell carcinoma removed from right lower leg    TOTAL HIP ARTHROPLASTY  06/2007   left   TOTAL HIP ARTHROPLASTY Right 08/26/2016   Procedure: TOTAL HIP ARTHROPLASTY ANTERIOR APPROACH;  Surgeon: Melrose Nakayama, MD;  Location: [facility];  Service: Orthopedics;  Laterality: Right;   TOTAL KNEE ARTHROPLASTY Right 10/2007   right    SOCIAL HISTORY: Social History   Socioeconomic History   Marital status: Married    Spouse name: Not on file   Number of children: Not on file   Years of education: Not on file   Highest education level: Not on file  Occupational History   Occupation: retired    Fish farm manager: RETIRED  Tobacco Use   Smoking status: Never   Smokeless tobacco: Never  Vaping Use   Vaping Use: Never used  Substance and Sexual Activity   Alcohol use: Not Currently    Comment: BEER/glass of wine A DAY   Drug use: No   Sexual activity: Never  Other Topics Concern   Not on file  Social History Narrative   Not on file   Social Determinants of Health   Financial Resource Strain: Not on file  Food Insecurity: Not on file  Transportation Needs: Not on file  Physical Activity: Not on file  Stress: Not on file  Social Connections: Not on file  Intimate Partner Violence: Not on file    FAMILY HISTORY: Family History  Problem Relation Age of Onset   Diabetes  Mother    Heart failure Mother    Hypertension Father    Diabetes Father    Heart disease Father    Heart attack Father     ALLERGIES:  has No Known Allergies.  MEDICATIONS:  Current Outpatient Medications  Medication Sig Dispense Refill   acyclovir (ZOVIRAX) 400 MG tablet TAKE ONE TABLET BY MOUTH TWICE DAILY 60 tablet 2   amLODipine (NORVASC) 10 MG tablet Take 10 mg by mouth daily.     Calcium Citrate-Vitamin D (CALCIUM + D PO) Take 1 tablet by mouth daily.     dicyclomine (BENTYL) 10 MG capsule Take 1 capsule (10 mg total) by mouth 3 (three) times daily as needed for spasms. 90 capsule 0   gemfibrozil (LOPID) 600 MG tablet Take 600 mg by mouth 2 (two) times daily before a meal.     HYDROcodone-acetaminophen (NORCO/VICODIN)  5-325 MG tablet Take 1 tablet by mouth every 6 (six) hours as needed for moderate pain. 30 tablet 0   levothyroxine (SYNTHROID, LEVOTHROID) 25 MCG tablet Take 25 mcg by mouth daily.      LORazepam (ATIVAN) 0.5 MG tablet Take 0.5 mg by mouth daily as needed for anxiety or sleep.     losartan (COZAAR) 50 MG tablet Take 50 mg by mouth daily.     Multiple Vitamin (MULTIVITAMIN) capsule Take 1 capsule by mouth daily.     ondansetron (ZOFRAN) 8 MG tablet Take 1 tablet (8 mg total) by mouth every 8 (eight) hours as needed. 30 tablet 0   pantoprazole (PROTONIX) 20 MG tablet Take 20 mg by mouth daily.     polyethylene glycol powder (GLYCOLAX/MIRALAX) 17 GM/SCOOP powder Take 17 g by mouth at bedtime.     pregabalin (LYRICA) 25 MG capsule Take 1 capsule (25 mg total) by mouth daily. 30 capsule 0   No current facility-administered medications for this visit.    REVIEW OF SYSTEMS:   Constitutional: ( - ) fevers, ( - )  chills , ( - ) night sweats Eyes: ( - ) blurriness of vision, ( - ) double vision, ( - ) watery eyes Ears, nose, mouth, throat, and face: ( - ) mucositis, ( - ) sore throat Respiratory: ( - ) cough, ( - ) dyspnea, ( - ) wheezes Cardiovascular: ( - )  palpitation, ( - ) chest discomfort, ( - ) lower extremity swelling Gastrointestinal:  ( - ) nausea, ( - ) heartburn, ( - ) change in bowel habits Skin: ( - ) abnormal skin rashes Lymphatics: ( - ) new lymphadenopathy, ( - ) easy bruising Neurological: (+ ) numbness, ( +) tingling, ( - ) new weaknesses Behavioral/Psych: ( - ) mood change, ( - ) new changes  All other systems were reviewed with the patient and are negative.  PHYSICAL EXAMINATION: ECOG PERFORMANCE STATUS: 2 - Symptomatic, <50% confined to bed  Vitals:   03/28/22 1311  BP: (!) 166/66  Pulse: 78  Resp: 15  Temp: 97.7 F (36.5 C)  SpO2: 99%   Filed Weights   03/28/22 1311  Weight: 112 lb 12.8 oz (51.2 kg)    GENERAL: Well-appearing elderly Caucasian female, alert, no distress and comfortable SKIN: skin color, texture, turgor are normal, no rashes or significant lesions EYES: conjunctiva are pink and non-injected, sclera clear LUNGS: clear to auscultation and percussion with normal breathing effort HEART: regular rate & rhythm and no murmurs and no lower extremity edema Musculoskeletal: no cyanosis of digits and no clubbing  PSYCH: alert & oriented x 3, fluent speech NEURO: no focal motor/sensory deficits  LABORATORY DATA:  I have reviewed the data as listed    Latest Ref Rng & Units 03/28/2022   12:56 PM 03/21/2022    2:06 PM 03/14/2022    9:46 AM  CBC  WBC 4.0 - 10.5 K/uL 4.2  3.5  4.1   Hemoglobin 12.0 - 15.0 g/dL 9.9  9.5  9.7   Hematocrit 36.0 - 46.0 % 28.1  27.1  27.4   Platelets 150 - 400 K/uL 253  239  261        Latest Ref Rng & Units 03/28/2022   12:56 PM 03/21/2022    2:06 PM 03/14/2022    9:46 AM  CMP  Glucose 70 - 99 mg/dL 108  103  96   BUN 8 - 23 mg/dL 17  16  16  Creatinine 0.44 - 1.00 mg/dL 0.82  0.80  0.74   Sodium 135 - 145 mmol/L 133  133  135   Potassium 3.5 - 5.1 mmol/L 4.5  4.7  3.8   Chloride 98 - 111 mmol/L 104  104  106   CO2 22 - 32 mmol/L _0 Calcium 8.9 - 10.3  mg/dL 9.0  9.0  8.5   Total Protein 6.5 - 8.1 g/dL 6.8  6.7  7.1   Total Bilirubin 0.3 - 1.2 mg/dL 0.4  0.3  0.3   Alkaline Phos 38 - 126 U/L 82  85  97   AST 15 - 41 U/L _1 ALT 0 - 44 U/L _2 Lab Results  Component Value Date   MPROTEIN 0.5 (H) 03/14/2022   MPROTEIN 0.5 (H) 02/14/2022   MPROTEIN 0.7 (H) 01/24/2022   Lab Results  Component Value Date   KPAFRELGTCHN 13.7 03/14/2022   KPAFRELGTCHN 15.4 02/14/2022   KPAFRELGTCHN 14.5 01/24/2022   LAMBDASER 9.8 03/14/2022   LAMBDASER 11.3 02/14/2022   LAMBDASER 9.1 01/24/2022   KAPLAMBRATIO 1.40 03/14/2022   KAPLAMBRATIO 1.36 02/14/2022   KAPLAMBRATIO 1.59 01/24/2022   RADIOGRAPHIC STUDIES: No results found.  ASSESSMENT & PLAN Jameeka Marcy Ridgley 88 y.o. female with medical history significant for newly diagnosed IgG lambda multiple myeloma who presents for a follow up visit.  #IgG Lambda Multiple Myeloma -- Diagnosis confirmed by bone marrow biopsy on 09/27/2021 which shows evidence of 20% involvement by plasma cells.  Additionally has anemia, macrocytosis, leukopenia, and concern for lytic lesions on bone imaging --Prognostic panel shows a deletion 17 with a TP53 mutation --Started Cycle 1 Day 1 of Velcade/Dex on 10/18/2021 --Switched to Dara/Dex on 03/14/2022 due to neuropathy secondary to Velcade.  Plan: --Due for Cycle 1 Day 1 of Dara/Dex today  --Labs from 03/14/2022 showed M protein 0.5 with a normal kappa lambda ratio --Labs today show white blood cell count 4.2, hemoglobin 9.9, MCV 93.4, and platelets of 253. Cr 0.82.  --Continue with weekly labs and Dara/Dex treatments and RTC in 2 weeks for a follow up visit.   # Confusion --decrease dexamethasone to 20 mg on infusion days.   #Leukopenia/Macrocytic anemia/Vitamin B12 deficiency: --Secondary to chemotherapy and multiple myeloma --Today, hemoglobin 9.9, MCV 93.4 --Received vitamin B12 injection 1000 mcg weekly x 4 doses. Will continue now with monthly  injections.   #Right rib pain, improved: --CT chest from 10/25/2021 that didn't show any new or progressive disease.   #Abdominal cramping: --Symptoms occur after velcade injection.  --sent prescription for bentyl   #Neuropathy: --Grade 2 involving finger tips and feet, worsening --Secondary to velcade  --Sent prescription of Lyrica 25 mg daily. Consider increasing to 50 mg if 25 mg dose provides minimal relief.   #Supportive Care -- chemotherapy education complete -- port placement not required.  -- zofran 47m q8H PRN and compazine 152mPO q6H for nausea -- acyclovir 40082mO BID for VCZ prophylaxis. Patient was not taking medication as prescribed. Advised importance of taking medication consistently.  -- We will start Zometa as soon as is feasible after dental clearance  No orders of the defined types were placed in this encounter.  All questions were answered. The patient knows to call the clinic with any problems, questions or concerns.  I have spent a total of 30 minutes minutes of face-to-face and non-face-to-face time, preparing to see the  patient, performing a medically appropriate examination, counseling and educating the patient, ordering medications/tests, documenting clinical information in the electronic health record, and care coordination.   Dede Query PA-C Dept of Hematology and [place] at [facility] Phone: [phone]   03/29/2022 3:25 PM

## 2022-03-31 ENCOUNTER — Telehealth: Payer: Self-pay | Admitting: Hematology and Oncology

## 2022-03-31 ENCOUNTER — Telehealth: Payer: Self-pay | Admitting: *Deleted

## 2022-03-31 NOTE — Telephone Encounter (Signed)
Per 8/21 secure chat called and left message for pt about appointment

## 2022-03-31 NOTE — Telephone Encounter (Signed)
Received call from pt requesting to have her future appts be changed to afternoons not 8am. She prefers Fridays. Advised that I would send scheduling message for these changes. Her appt on 8/25 is in the afternoon. Message sent to [facility] [place] to reschedule those future appts.

## 2022-04-02 ENCOUNTER — Other Ambulatory Visit: Payer: Self-pay

## 2022-04-04 ENCOUNTER — Inpatient Hospital Stay: Payer: Medicare PPO

## 2022-04-04 VITALS — BP 150/59 | HR 68 | Temp 97.9°F | Resp 17 | Wt 113.5 lb

## 2022-04-04 DIAGNOSIS — Z5112 Encounter for antineoplastic immunotherapy: Secondary | ICD-10-CM | POA: Diagnosis not present

## 2022-04-04 DIAGNOSIS — C9000 Multiple myeloma not having achieved remission: Secondary | ICD-10-CM

## 2022-04-04 LAB — CBC WITH DIFFERENTIAL (CANCER CENTER ONLY)
Abs Immature Granulocytes: 0.01 10*3/uL (ref 0.00–0.07)
Basophils Absolute: 0 10*3/uL (ref 0.0–0.1)
Basophils Relative: 0 %
Eosinophils Absolute: 0 10*3/uL (ref 0.0–0.5)
Eosinophils Relative: 1 %
HCT: 27.2 % — ABNORMAL LOW (ref 36.0–46.0)
Hemoglobin: 9.7 g/dL — ABNORMAL LOW (ref 12.0–15.0)
Immature Granulocytes: 0 %
Lymphocytes Relative: 16 %
Lymphs Abs: 0.6 10*3/uL — ABNORMAL LOW (ref 0.7–4.0)
MCH: 33.1 pg (ref 26.0–34.0)
MCHC: 35.7 g/dL (ref 30.0–36.0)
MCV: 92.8 fL (ref 80.0–100.0)
Monocytes Absolute: 0.4 10*3/uL (ref 0.1–1.0)
Monocytes Relative: 9 %
Neutro Abs: 3.1 10*3/uL (ref 1.7–7.7)
Neutrophils Relative %: 74 %
Platelet Count: 225 10*3/uL (ref 150–400)
RBC: 2.93 MIL/uL — ABNORMAL LOW (ref 3.87–5.11)
RDW: 12.4 % (ref 11.5–15.5)
WBC Count: 4.1 10*3/uL (ref 4.0–10.5)
nRBC: 0 % (ref 0.0–0.2)

## 2022-04-04 LAB — CMP (CANCER CENTER ONLY)
ALT: 6 U/L (ref 0–44)
AST: 14 U/L — ABNORMAL LOW (ref 15–41)
Albumin: 4 g/dL (ref 3.5–5.0)
Alkaline Phosphatase: 84 U/L (ref 38–126)
Anion gap: 7 (ref 5–15)
BUN: 13 mg/dL (ref 8–23)
CO2: 21 mmol/L — ABNORMAL LOW (ref 22–32)
Calcium: 8.9 mg/dL (ref 8.9–10.3)
Chloride: 103 mmol/L (ref 98–111)
Creatinine: 0.56 mg/dL (ref 0.44–1.00)
GFR, Estimated: >60 mL/min (ref >60)
Glucose, Bld: 118 mg/dL — ABNORMAL HIGH (ref 70–99)
Potassium: 4.5 mmol/L (ref 3.5–5.1)
Sodium: 131 mmol/L — ABNORMAL LOW (ref 135–145)
Total Bilirubin: 0.3 mg/dL (ref 0.3–1.2)
Total Protein: 6.5 g/dL (ref 6.5–8.1)

## 2022-04-04 LAB — LACTATE DEHYDROGENASE: LDH: 131 U/L (ref 98–192)

## 2022-04-04 MED ORDER — DIPHENHYDRAMINE HCL 25 MG PO CAPS
50.0000 mg | ORAL_CAPSULE | Freq: Once | ORAL | Status: AC
  Administered 2022-04-04: 50 mg via ORAL
  Filled 2022-04-04: qty 2

## 2022-04-04 MED ORDER — ACETAMINOPHEN 325 MG PO TABS
650.0000 mg | ORAL_TABLET | Freq: Once | ORAL | Status: AC
  Administered 2022-04-04: 650 mg via ORAL
  Filled 2022-04-04: qty 2

## 2022-04-04 MED ORDER — DARATUMUMAB-HYALURONIDASE-FIHJ 1800-30000 MG-UT/15ML ~~LOC~~ SOLN
1800.0000 mg | Freq: Once | SUBCUTANEOUS | Status: AC
  Administered 2022-04-04: 1800 mg via SUBCUTANEOUS
  Filled 2022-04-04: qty 15

## 2022-04-04 MED ORDER — DEXAMETHASONE 4 MG PO TABS
20.0000 mg | ORAL_TABLET | Freq: Once | ORAL | Status: AC
  Administered 2022-04-04: 20 mg via ORAL
  Filled 2022-04-04: qty 5

## 2022-04-06 ENCOUNTER — Other Ambulatory Visit: Payer: Self-pay

## 2022-04-08 ENCOUNTER — Other Ambulatory Visit: Payer: Self-pay | Admitting: Hematology and Oncology

## 2022-04-09 NOTE — Patient Instructions (Signed)
ICD-10-CM   1. Sarcoidosis  D86.9     2. Right middle lobe syndrome  J98.19     3. Closed wedge compression fracture of T7 vertebra, initial encounter ([place])  S22.060A     4. Multiple myeloma not having achieved remission (HCC)  C90.00         Sarcoidosis appears stable and in symptom remission. No sarcoid symptoms. No cough.  CT Chest march 2023 show overall improvement since 2020 thought RML collapse perissts (no visible on CXR though)  Plan  - Reduce prednisone to 0.42m mg/day    - take MOnday , Wednesday, Friday x 1 weeks  - then Monday and Friday x 1 week  - then Monday alone x 1 week  - then STOP    T-spine wedge fracture - T3, T4, T7, T8 sever, T9 severe, T10 moderate  Opioid requiring back pain with reduced mobility Myleoma recent history    Plan - Per oncology, and PCP    Follow-up 12 weeks with [name] Page - can be video visit if you like; 15 min

## 2022-04-09 NOTE — Progress Notes (Unsigned)
OV 08/24/2014  Chief Complaint  Patient presents with   Follow-up    Pt stated the gabapentin has not changed her breathing or cough. Pt c/o prod cough with white mucus. Pt denies SOB and CP/tightness.    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 Main symptom is severe chronic cough with high RSI score suggestive of irritable larynx syndrome that is associated as well  She always finds prednisone to help the cough but due to age and side effects despite good functional status she opts to take a low-dose of prednisone. Currently she is on 5 mg prednisone. In November 2015 due to RSI score of 27 for cough but decided to trial gabapentin to tackle the neurogenic component of cough. She reports now that the gabapentin has not helped her at all although objectively the RSI cough score has drop to 20. She is very frustrated. She and her husband are reluctant to have right middle lobectomy given her advanced age despite good functional status. There are no open to trying methotrexate to tackle the inflammation of sarcoidosis and as a steroid sparing agent. She also wants to come off gabapentin   OV 10/02/2014  Chief Complaint  Patient presents with   Follow-up    Pt states her breathing is doing well. Pt states her SOB has improved since last OV. Pt c/o mild cough. Pt denies CP/tightness. Pt stated being on methotrexate has significantly helped.     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 Main symptom is severe chronic cough with high RSI score suggestive of irritable larynx syndrome that is associated as well    Last visit in January 2016 she told me that gabapentin did not help her cough. So we stopped this. We started methotrexate for inflammation in her airway due to  sarcoid causing right middle lobe collapse. We left her on prednisone 5 mg per day and started her on methotrexate 5 mg  once a week. Also gave Hycodan cough syrup. She says that the Hycodan cough syrup did not help However, 2 weeks after starting her methotrexate she started noticing dramatic improvement in her cough. She hardly coughs now. She has significant improvement in her quality of life and she is extremely happy.  RSI cough score had dropped from 25 to 20 with gabapentin has now dropped to 5.  She is asking questions about how long to take methotrexate. And also review of the side effect profile. Of note I did notice that she is on ACE inhibitor that can potentially provoke all make her cough worse. She is not taking gabapentin anymore. There is no fever or chills. At this point in time she's not interested in Bactrim PCP prophylaxis which should be okay given low-dose of methotrexate and prednisone  Past, Family, Social reviewed: no change since last visit    [place] 01/02/2015  Chief Complaint  Patient presents with   Follow-up    Pt recently went to opthamologist and pts vision has decreased since last OV. Pt stated her breathing is unchanged since last OV. Pt c/o mild dry cough. Pt denies CP/tightness.     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 Main symptom is severe chronic cough with high RSI score suggestive of irritable larynx syndrome that is associated as well  Cough /Saarcoid / IRritiable LArynx/ RML syundrome - cough persists but is only mild and baseline. No problems. Does  not feel it impacts qualkity of life. She is off ace inhibitor now  ; forgot to mention this during med reconciliation. No asociated wheeze or dyspnea. Maintained on mehtotrxate 9m weekly, folic acid and prednisone 547mdaily. No evidence of infection. Last LFT was 3 months ago and normal. Hycodan helping cough and wants refill  NEw issue - her eye doc Dr McEllie Lunchalled yesterday. PAtient has no complaints of vision but post capsulootmy few week ago and now yesterday visual  field deficit on Rt side is worse. Concern is ocular sarcoid is worse. [facility] not want [facility] up Rx of sarcoid till all this is sorted out  New issue - noticed some bruising in left forearm yesterday sponatenous x 2 spots. New. Asytmptomatcic and last 1 weeks some punctate red lesion in distal 1/3 of leg. Associatd with gardening but denies poison ivy. Unchanged since insidious onset. No associatd fever.    OV 04/09/2015  Chief Complaint  Patient presents with   Follow-up    Pt denies cough, SOB, CP/tightness. Pt states her breathing is doing well. Pt states she had a recent rash on BIL lower extremities and saw derm and was given a topical ointment that helped. Per pt the derm thought the rash was not related to sarcoid.     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 Main symptom is severe chronic cough with high RSI score suggestive of irritable larynx syndrome that is associated as well   Cough /Saarcoid / IRritiable LArynx/ RML syundrome - cough is significantly improved. RSI cough score is only 14. She feels that the cough is at a stage where it is completely fine and she can handle things. She is maintained on methotrexate 5 mg weekly, folic acid and prednisone 5 mg daily. She did have some lowish white count after last visit and anemia but we held to the methotrexate and it seems to be stable. She also had a rash at the time of last visit she has seen dermatology and apparently the biopsies do not show this is due to sarcoid. I personally review this outside chart and confirmed that. Of note her last chest x-ray was November 2015 which showed continued right middle lobe collapse. The cough is controlled with the methotrexate and prednisone. She is no longer on ACE inhibitor even though our MAR reports that Also noted that she reports some mild weight gain with prednisone but she says she will try dietary  measures to shake this weight off. She does not want to cough to come and therefore she is content continue with the methotrexate and prednisone. In terms of her vision she is waiting for [facility] referral to [facility]mology in October 2016.    OV 07/16/2015  Chief Complaint  Patient presents with   Follow-up    Pt states her breathing is unchanged but her cough has worsened. Pt states her SOB is at her baseline. Pt states her cough is nonproductive and worse in the evening. Pt denies CP/tightness.     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 Main symptom is severe chronic cough with high RSI score suggestive of irritable larynx syndrome that is associated as well   Cough /Saarcoid / IRritiable LArynx/ RML syundrome -she now reports that her cough is worse for the last few months especially since last visit. RSI cough score is 18 and subjectively she feels that  cough is worse by 25%. Otherwise she is okay. Cough is associated with worsening postnasal drip. She is also asking for refills on a cough syrup methotrexate folic acid. She prefers to have 90 day refills. Despite cough worsened she is not too keen on gabapentin therapy and wants to try other simpler measures at this point in time. There is no fever or sputum production that is associated with the cough.  In terms of her ophthalmology issues she has been reassured apparently it is a left optic now that is problematic in this 50% gone. No active follow-up.   OV 10/18/2015   Chief Complaint  Patient presents with   Follow-up    Pt states her cough has significantly improved. Pt denies SOB and CP/tightness. Pt states overall she feels she is doing well.     Follow-up chronic cough associated with right middle lobe syndrome secondary to sarcoidosis and irritable larynx syndrome  Last visit December 2016. At the time cough that deteriorated. The significant amount of postnasal  drip. We instituted nasal steroids and with this cough is significantly improved and back to baseline. RSI cough score is 10 which is around her baseline. She feels immensely better. She is in a good phase of her health. There are no new issues. She is pending $24 and over-the-counter nasal steroid and we discussed about doing a generic prescription which might be cheaper. She wants refill postdated on her cough syrup. She wants 90 day refills on her methotrexate. She is happy with having a next follow-up in 6 months.   OV 04/22/2016  Chief Complaint  Patient presents with   Follow-up    Pt states her breathing is at baseline. Pt states she is doing well. Pt states has a rare cough, not needing cough syrup any longer. Pt denies CP/tightness and f/c/s.      follow-up chronic cough associated with right middle lobe syndrome secondary to sarcoidosis [also ocular sarcoid] and irritable larynx syndrome  Last visit was me was in March 2017. In the interim she has visited Designer, jewellery. She is on prednisone 5 mg per day and also methotrexate 5 mg once a week.Her cough is significantly improved. She had a chest x-ray May 2017 that I personally visualized shows significant improvement in her right middle lobe laps compared to 2011/2012. She's not on the needing cough syrup. She has right hip DJD now and apparently hip surgery might be contemplated. She is using nasal ster  oid. She does not do Bactrim prophylaxis. RSI cough score show significant improvement with score of 6 which is only mild cough.   OV 10/28/2016  Chief Complaint  Patient presents with   Follow-up    sarcoidosis, wants to discuss hair loss    Fu Chronic cough due to right middle lobe syndrome due to sarcoidosis with associated with ocular sarcoidosis. On methotrexate 5 mg once weekly associated with prednisone low dose 4 mg/5 mg   She continues to do well. Cough is improved 1 RSI cough score 5 is documented below. In January  2008 and she had replacement and she had a clear chest x-ray that I personally visualized. The cough is only mild. She has a new problem in the last 6 months of mild alopecia that is persistent and stable. Not associated with any pain or radiation or aggravating or relieving factors. It is associated with methotrexate intake. She wants to know about taking Biotene supplements that was advised by the hairdresser. Husband is wondering if methotrexate  is the etiology for this. Methotrexate can cause less than 10% alopecia not otherwise specified.   OV 01/13/2017  Chief Complaint  Patient presents with   Follow-up    Pt states her breathing is baseline. Pt states she has weaned off the methotrexate. Pt states she has mild dry cough. Pt denies CP/tightness.     88 year old female follow-up sarcoidosis associated with severe chronic cough right middle lobe syndrome and associated ocular sarcoidosis. Previously on methotrexate but low-dose prednisone currently.  Last visit in March 2018 because of good control cough and new onset of alopecia has mild he stopped the methotrexate. After this she is here for follow-up. She says that her cough has not gotten worse in the absence of methotrexate. Of note methotrexate was the only agent to improve her cough a few years ago. Her alopecia is improving after stopping the methotrexate. She strongly believes the methotrexate as the cause of alopecia and is agreeable for methotrexate to be listed in her allergies. She is continuing 5 mg of redness on her day. She plans to continue this because of ocular sarcoidosis as well. Her weight is causing some easy bruising which she is accepting. Today RSI cough score is only 3 and shows good control of cough    OV 11/10/2017  Chief Complaint  Patient presents with   Follow-up    Pt states she has been doing well since last visit and denies any complaints or concerns.     88 year old female follow-up sarcoidosis  associated with severe chronic cough right middle lobe syndrome and associated ocular sarcoidosis. Previously on methotrexate as of 2018 but low-dose prednisone currently through 2019. LAsT CXR Jan 2018 - clear   Follow-up chronic cough related to the above. She is now off methotrexate for over a year. Her alopecia has resolved. Her hair is back. She feels happy. She is on 5 mg prednisone a day. She feels well. Cough is minimal to nonexistent. She has ophthalmology appointment pending. I thought the current prednisone is because of her ocular sarcoid but she thinks it might be because of pulmonary and ocular sarcoid. She is willing to taper her prednisone down to 4 mg per day. Her husband is here with her as always. She is up-to-date with flu shot.She feels well without any respiratory symptoms of fatigue. No new medical problems or ER visits   OV 05/25/2018  Subjective:  Patient ID: [name], female , DOB: 25-Nov-1930 , age 42 y.o. , MRN: [id number] , ADDRESS: [address] [place] [facility] [address]   05/25/2018 -   Chief Complaint  Patient presents with   Follow-up    Doing well at this time.     HPI HENESSY ROHRER 89 y.o. -     OV 05/25/2018  Chief Complaint  Patient presents with   Follow-up    Doing well at this time.     88 year old female follow-up sarcoidosis (bx proven LUL dec 2014 at [facility]) associated with severe chronic cough right middle lobe syndrome and associated ocular sarcoidosis. Previously on methotrexate as of 2018 but low-dose prednisone currently through 2019. LAsT CXR Jan 2018 - clear   Last seen in April 2019.  At that time she was doing asymptomatic.  She currently is asymptomatic but this is a quicker than expected follow-up.  This is because she went to her primary care physician and she talked about her right middle lobe syndrome.  Therefore a chest x-ray was done and this is not normal.  Therefore a CT was recommended.  She then called my office.   We decided to get a CT chest and see her today.  She and her husband insist that she is still asymptomatic.  She is doing well on 4 mg prednisone.  She is exercising regularly.  No cough, no wheeze, no hemoptysis no sputum.  However CT scan of the chest October 2019 when compared to 2011 is significantly worse with worsening infiltrates and right middle lobes collapse and also beginning stages of left upper lobe collapse.  Most of her decline seem to happen in the early years of the diagnosis of sarcoidosis between 2011 and 2015.  In 2014 she did have a CT scan of the chest at [facility].  I do not have the images to compare but it appears that most of the report from 2014 and the current 2019 report might just be the same or perhaps the infiltrates are worse.  It is unclear.  The best comparison might be chest x-ray to chest x-ray because the most recent chest x-ray was 2018.  Also pulmonary function testing is been a while since we did one.    CT dec 2014 at [place]  - unable to see image - local comparison [facility] 2012, 2014 CT chest [facility]/21/2018 [facility] - [facility]  There is occlusion of the right middle lobe bronchus with atelectasis of the majority of the right middle lobe. There is also occlusion of the left upper lobe bronchus proximally with associated soft tissue, increased from prior. There is diffuse mosaic attenuation which likely represents air trapping.  Impression:  1. New 5 mm left upper lobe pulmonary nodule. 6-12 month followup is recommended. Other pulmonary nodules are stable.  2. Persistent occlusion of the right middle lobe bronchus proximally with near complete right middle lobe atelectasis. There is occlusion of the left upper lobe bronchus which is slightly increased from the prior study with adjacent soft tissue. Attention on followup is recommended.  Electronically Signed by:  Rocky Link, MD Electronically Signed on:  07/19/2013 1:29 PM Mediastinum/Nodes:  No mediastinal lymphadenopathy. Small mediastinal lymph nodes have subtle mineralization evident. Irregular Soft tissue fullness in the hilar regions is similar to prior scan. The esophagus has normal imaging features. There is no axillary lymphadenopathy.   Lungs/Pleura: The central tracheobronchial airways are patent. 7 mm left upper lobe pulmonary nodule is stable in the interval. Interval development of numerous, right greater than left, irregular bilateral pulmonary nodules. There is diffuse Perilymphatic thickening and nodularity, right greater than left, progressive in the interval with nodular thickening of the right major fissure progressive since prior study. These changes are associated with complete collapse of the right middle lobe. No pleural effusion  IMPRESSION: 1. Interval progression of bilateral perilymphatic thickening and nodularity, right greater than left. Imaging features are compatible with the patient's known history of sarcoidosis. 2. Interval development of complete collapse right middle lobe. Presumably also secondary to sarcoidosis and hilar involvement, but close follow-up recommended to exclude central obstructing lesion un related to sarcoidosis. 3.  Aortic Atherosclerois (ICD10-170.0)     Electronically Signed   By: Misty Stanley M.D.   On: 05/21/2018 09:54     OV 08/24/2018  Subjective:  Patient ID: [name], female , DOB: 11-Jul-1931 , age 50 y.o. , MRN: [id number] , ADDRESS: [address] [place] [facility] [address]   08/24/2018 -   Chief Complaint  Patient presents with   Follow-up    PFT performed  08/23/2018.  Pt states she has been doing well since last visit and denies any complaints.     HPI Santiaga Butzin Cuttino 88 y.o. -+ returns for follow-up of her sarcoidosis with cough and middle lobe syndrome.  She is now on 5 mg prednisone.  She increase it from 4 mg prednisone because of cough.  With this the cough is well controlled.  The issue  more recently is that in 2019 she had a CT chest that shows progression of her sarcoid compared to 2011.  However in the interim she has had CT scans at [facility] in 2012 and 2014.  Ideally we would need to make a comparison to the most recent CT scan it appears at least based on reports the CT scans at [facility] in 2014 and [place] in 2019 might be similar.  However it is challenging to get radiology discs from outside institutions.  Therefore, we decided to look for progression with a pulmonary function test today which is essentially normal except for mild reduction in DLCO.  Incidentally, we try to get her chest x-ray image from primary care physician locally and so far we have not been able to do that.  Currently she is is also asymptomatic.  She is asymptomatic on 5 mg prednisone.   Results for TAMA, GROSZ (MRN [id number]) as of 08/24/2018 13:53  Ref. Range 08/23/2018 09:37  FEV1-Pre Latest Units: L 2.00  FEV1-%Pred-Pre Latest Units: % 127  Pre FEV1/FVC ratio Latest Units: % 72  Results for AVLEEN, BORDWELL (MRN [id number]) as of 08/24/2018 13:53  Ref. Range 08/23/2018 09:37  FEV1-Pre Latest Units: L 2.00  FEV1-%Pred-Pre Latest Units: % 127  Pre FEV1/FVC ratio Latest Units: % 72    OV 05/26/2019  Subjective:  Patient ID: [name], female , DOB: 03-21-1931 , age 43 y.o. , MRN: [id number] , ADDRESS: [address] [place] [facility] [address]   05/26/2019 -   Chief Complaint  Patient presents with   Follow-up    Pt states her breathing is doing well. Pt states her cough is mild. Pt states her cough is unchanged since last OV in 08/2018. Pt CP/tightness, f/c/s.    Follow-up sarcoidosis pulmonary with ocular sarcoid right middle lobe syndrome and chronic cough.  Previously on methotrexate.  Currently on prednisone 5 mg/day.  HPI ODALIS JORDAN 88 y.o. -last seen January 2020.  Since then she has been social distancing.  She and her husband are doing well.  She is  currently on prednisone 5 mg/day and has very minimal cough.  RSI cough score is only 2 although she thinks maybe the cough is increased compared to last visit but the subjective score shows the reverse.  She has no other respiratory issues.  Specifically denies any wheezing or shortness of breath or hemoptysis or weight loss or night sweats or chills.  She swims and exercises.  She is up-to-date with her flu shot.  We specifically discussed about the fact that October 2019 CT scan of the chest showed progression compared to many years ago although we agree that this progression is not really progression if one were to compare 2012 and 2014 CT scans of the chest at [facility].  In other words the progression should have happened prior to 2012 or prior to 2014.  However we have been unable to get the CT scan CD-ROM from [facility].  She is having a CT angios neck coming up with Dr. Floyde Parkins of  neurology/CT scan will be done at [place].  She is interested in getting a CT scan of the chest if we can coincide the appointments.  She understands that if her sarcoidosis shows progression that she will not be interested in methotrexate or other immunomodulators.  She understands the risk of radiation.  However she is willing to undergo the CT scan to have a sense of a progression versus stability.        ROS - per HPI     OV 12/08/2019  Subjective:  Patient ID: [name], female , DOB: Feb 04, 1931 , age 63 y.o. 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  Previously on methotrexate.  Currently on prednisone 5 mg/day.  12/08/2019 -   Chief Complaint  Patient presents with   Follow-up    No complaints of SOB/ coughing or whezzing      HPI Nilam L Liese 88 y.o. -returns for follow-up for right middle lobe syndrome with chronic cough.  Currently on  prednisone 5 mg/day.  Last fall when she visited with me we had a CT scan of the chest that is shown improvement/stability on the report.  Currently she is without any cough.  She continues on 5 mg prednisone per day.  She does not want to lower the dose of the prednisone.  She is happy with this.  She also wants to continue with her fluticasone.  She wants refills of this.  She is up-to-date with the Covid vaccine.  She is going to have a jaw MRI because of jaw pain but otherwise is doing well.    ROS - per HPI  IMPRESSION: 1. Pulmonary parenchymal sarcoidosis, primarily improved. The only area of progression is in the central right lower lobe. 2. Slight improved appearance of the right middle lobe, with decrease in collapse/consolidation and airway compression. 3. No thoracic adenopathy. 4. Pulmonary artery enlargement suggests pulmonary arterial hypertension.     Electronically Signed   By: Abigail Miyamoto M.D.   On: 06/01/2019 14:58    OV 07/25/2020  Subjective:  Patient ID: [name], female , DOB: 1930/08/22 , age 65 y.o. , MRN: [id number] , ADDRESS: [address] [place] [address] PCP Burnard Bunting, MD Patient Care Team: Burnard Bunting, MD as PCP - General (Internal Medicine)  This Provider for this visit: Treatment Team:  Attending Provider: Brand Males, MD    07/25/2020 -   Chief Complaint  Patient presents with   Follow-up    Patient has been having trouble with heartburn and acid reflux, notices it more during the day when she is active. Prednisone change has helped.   Sarcoidosis with right middle lobe syndrome and chronic cough Has optic sarcoid as well  HPI Lenia L Coca 88 y.o. -returns for follow-up.  Last seen in April 2021.  Last chest x-ray was in 2018 last CT scan of the chest was in 2020 with some improvement.  In April 2021 we reduced her prednisone to 4 mg/day but she called back saying there was a return in cough so she went  back up to prednisone 5 mg/day and this showed improvement in cough.  Currently cough is well controlled.  However in the last few months but subsequent increasing her prednisone back to her original baseline of 5 mg/day she is experiencing new heartburn with certain foods and bending over.  It is  random.  It is not exertional.  She reportedly saw Dr. Acie Fredrickson sometime earlier this year ago in the last 1 to 2 years and was cleared from her cardiac standpoint.  Pepcid relieves this symptom.  There is no weight loss.    ROS - per HPI    OV 04/11/2021  Subjective:  Patient ID: [name], female , DOB: [birth year] , age 70 y.o. , MRN: [id number] , ADDRESS: [address] [place] [address] PCP Burnard Bunting, MD Patient Care Team: Burnard Bunting, MD as PCP - General (Internal Medicine)  This Provider for this visit: Treatment Team:  Attending Provider: Brand Males, MD    04/11/2021 -   Chief Complaint  Patient presents with   Follow-up    Pt states she would like to talk about problems she having with prednisone.    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 -returns for follow-up.  Last seen 9 months ago.  At that point in time I thought we resolved we will do 5 mg/day of prednisone because at 4 mg/day of prednisone her cough is returning.  We did a chest x-ray and a right middle lobe syndrome collapse had resolved compared to many years earlier.  The radiologist did not specifically comment about it.  It was my Sports coach opinion.  It appears in the interim she has had progressive issues with the right shoulder and unable to abduct it..  She is also in the last month or 2 having issues with the back pain.  She has seen Dr. Rhona Raider for this.  She has had MRI and apparently she might have cement placed.  She has a brace and this is helping her.  Based on all this she is  really nervous about continuing prednisone.  She ideally would like to come off prednisone slowly.  At this point in time because she is doing well on 4 mg/day of prednisone she wants to reduce it to 3 mg/day of prednisone but ultimately if possible work towards completely quitting prednisone.  She states her cough is resolved.  She is not having any visual problems at this point.  Not having any skin problems.     OV 10/22/2021  Subjective:  Patient ID: [name], female , DOB: [birth year] , age 42 y.o. 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 States that she has had extreme pain on her right side which is unsure if it is associated with that.     HPI Synethia Endicott Hodkinson 88 y.o. -returns for follow-up.  She is here with her husband.  She continues to express that she has not had cough for 1 year now.  She is on 2 mg prednisone per day.  She ultimately wants to get off prednisone.  She is asking to taper down to 1 mg/day.  She wants to get off in the next several weeks to few months.  In  the interim she has had bunch of health issues.  She continues to have persistent problems abducting her right shoulder.  She thinks is because of a tendon tear.  She has seen Ortho for this.  Because of her back.  She she did end up having further work-up and has been diagnosed with multiple myeloma.  In January 2023 she did have bone scan that shows bilateral lucencies in bilateral humerii.  Bone marrow biopsy also confirmed multiple  myeloma.  She started chemotherapy as below with Velcade and dexamethasone.      She has a new problem she is complaining of bony pain in the right infra axillary area.  When I palpated it appears the ribs are somewhat tender to touch.  It is constant.  It gets aggravated by car ride or walking.  It gets improved by applying a brace.  No over-the-counter medication is helping it.  Symptom severity ranges from 3 at a minimum to 9 at its worst.  It can fluctuate.  There is no fall history.  ' Myeloma history Hematological/Oncological History # IgG Lambda Multiple Myeloma 06/04/2021: WBC 5.5, Hgb 11.1, MCV 101.3, Plt 306 07/24/2021: M protein 2.3, IFE shows IgG Lambda monoclonal specificity. Cr 0.9 08/07/2021: Establish care with Dr. Lorenso Courier 09/27/2021: Bmbx showed hypercellular bone marrow with plasma cell neoplasm, increased number of  atypical plasma cells averaging 20% of all cells in the aspirate plan for Cycle 1 Day 1 of Velcade/Dex to start on 10/18/2021.  Revlimid is under consideration depending on how she does with these 2.   CT Chest data  No results found.    11/19/2021: Today- follow-up Patient presents today for follow-up after HRCT and tapering prednisone from 2 mg/day to 1 mg/day.  She reports overall feeling relatively the same.  Continues to have pain in her shoulder and arm.  She does feel like she rarely gets the urge to cough and has some difficulty producing 1.  She thinks this is more so related to her multiple myeloma then decreasing her prednisone.  Her breathing is stable without any shortness of breath.  She denies any skin lesions or vision changes.  No recent fevers, night sweats or anorexia.  Overall feeling stable PFT   OV 12/20/2021  Subjective:  Patient ID: [name], female , DOB: Apr 30, 1931 , age 43 y.o. , MRN: [id number] , ADDRESS: [address] [place] [address] PCP Burnard Bunting, MD Patient Care Team: Burnard Bunting, MD as  PCP - General (Internal Medicine)  This Provider for this visit: Treatment Team:  Attending Provider: Brand Males, MD    12/20/2021 -   Chief Complaint  Patient presents with   Follow-up    Pt states she has been doing okay since last visit. States that tapering off of the prednisone is going good.    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 -returns for follow-up.  This visit is to review her taper of prednisone.  As she is now down to half a milligram per day.  The prednisone is not showing up on her med list but she says she is only taking half a milligram per day.  She wants to get off.  She is not having any cough.  No shortness of breath.  She is just dealing  with back pain for which she needs opioids.  The multiple myeloma treatment is going well.  She says she is responding to the treatment.  She says the protein levels are improving.  She uses a walker.  No vision problems that she is reducing her sarcoid prednisone.  CT chest reviewed     CT Chest data  - HRCT march 2023  Narrative & Impression  CLINICAL DATA:  Follow-up sarcoidosis.   EXAM: CT CHEST WITHOUT CONTRAST   TECHNIQUE: Multidetector CT imaging of the chest was performed following the standard protocol without intravenous contrast. High resolution imaging of the lungs, as well as inspiratory and expiratory imaging, was performed.   RADIATION DOSE REDUCTION: This exam was performed according to the departmental dose-optimization program which includes automated exposure control, adjustment of the mA and/or kV according to patient size and/or use of iterative reconstruction technique.   COMPARISON:  06/01/2019 chest CT.   FINDINGS: Motion degraded scan, limiting  assessment.   Cardiovascular: Mild cardiomegaly, slightly increased. No significant pericardial effusion/thickening. Three-vessel coronary atherosclerosis. Atherosclerotic nonaneurysmal thoracic aorta. Normal caliber pulmonary arteries.   Mediastinum/Nodes: No discrete thyroid nodules. Unremarkable esophagus. No pathologically enlarged axillary, mediastinal or hilar lymph nodes, noting limited sensitivity for the detection of hilar adenopathy on this noncontrast study.   Lungs/Pleura: No pneumothorax. No pleural effusion. Complete right middle lobe atelectasis, unchanged. No acute consolidative airspace disease or lung masses. Solid 0.8 cm medial apical left upper lobe pulmonary nodule (series 3/image 24), stable. Stable insert scattered medial left upper lobe subcentimeter granulomas. No new significant pulmonary nodules. No significant lobular air trapping or evidence of tracheobronchomalacia on the expiration sequence. Mild finely nodular thickening of the peribronchovascular interstitium in the right lower lobe, decreased. Mild cylindrical bronchiectasis in the right lower lobe is mildly increased. No significant regions of subpleural reticulation. No frank honeycombing. Thin scattered parenchymal bands at the lung bases bilaterally.   Upper abdomen: Chronic moderate eventration of the anterior right hemidiaphragm. Cholelithiasis.   Musculoskeletal: No aggressive appearing focal osseous lesions. Partially visualized right glenohumeral joint effusion. Chronic mild T3 and T4, severe T6, severe T7, severe T8, severe T9 and moderate T10 vertebral compression fractures, status vertebroplasty at T7, T8, T9 and T10. Mild thoracic spondylosis.   IMPRESSION: 1. Mild finely nodular thickening of the peribronchovascular interstitium in the right lower lobe, decreased. Mild cylindrical bronchiectasis in the right lower lobe is mildly increased. Findings are compatible with mild  pulmonary sarcoidosis, overall improved. No new or progressive disease. 2. Chronic complete right middle lobe atelectasis. 3. Mild cardiomegaly, slightly increased. Three-vessel coronary atherosclerosis. 4. Cholelithiasis. 5. Chronic moderate eventration of the anterior right hemidiaphragm. 6. Chronic multilevel thoracic vertebral compression fractures. 7. Partially visualized right glenohumeral joint effusion. 8. Aortic Atherosclerosis (ICD10-I70.0).     Electronically Signed   By: Ilona Sorrel M.D.    No results found.         OV 04/10/2022  Subjective:  Patient ID: [name], female , DOB: 03/16/1931 , age 32 y.o. , MRN: [id number] , ADDRESS: [address] [place] [phone] PCP Burnard Bunting, MD Patient Care Team: Burnard Bunting, MD as PCP - General (Internal Medicine)  This Provider for this visit: Treatment Team:  Attending Provider: Brand Males, MD    04/10/2022 -   Chief Complaint  Patient presents with   Follow-up    Pt states she has been doing okay since last visit.    Sarcoidosis with right middle lobe syndrome and chronic  cough & Has optic sarcoid as well  - cough resolved #Multiple myeloma diagnosed 2022/2023 #Right shoulder frozen #Chronic vertebral fractures presumably because of sarcoid prednisone  HPI Chonte L Majkowski 88 y.o. -returns for follow-up.  She is now off chronic prednisone since approximately June 2023.  She is doing well this no withdrawal side effects.  She is undergoing myeloma treatment and with that she gets some Decadron.  Overall the myeloma treatment is causing fatigue and the visits are causing her to be tired.  Otherwise she is doing well.  She plans to get the flu, RSV and COVID mRNA vaccines.  Otherwise no new issues.  She is now on expectant follow-up for the sarcoid.  She has been able to wean off prednisone successfully.    CT Chest data  No results found.    PFT     Latest Ref  Rng & Units 08/23/2018    9:37 AM  PFT Results  FVC-Pre L 2.80   FVC-Predicted Pre % 130   Pre FEV1/FVC % % 72   FEV1-Pre L 2.00   FEV1-Predicted Pre % 127   DLCO uncorrected ml/min/mmHg 16.59   DLCO UNC% % 76   DLVA Predicted % 88        has a past medical history of Alopecia (10/28/2016), Anginal pain ([place]), Arthritis, Cervical spondylosis, Chest pain (07/28/2008), Chronic cough (07/12/2014), Collapsed lung (41/6606), Complication of anesthesia, Cough (07/30/2010), Cystocele (05/23/2015), Cystocele, midline (05/02/2013), DEEP VEIN THROMBOSIS/PHLEBITIS (07/30/2010), Dislocation closed, shoulder (02/2013), DVT (deep venous thrombosis) ([place]) (2006), Encounter for therapeutic drug monitoring (10/03/2014), GERD (gastroesophageal reflux disease), Headache, High cholesterol, History of recurrent UTIs (09/24/2011), HYPERLIPIDEMIA (07/30/2010), Hypertension, HYPERTENSION (07/30/2010), Hypothyroidism, Nasal itching (01/30/2014), Osteopenia (05/23/2015), Peripheral vascular disease ([place]) (2006), Pleuritic pain (12/17/2015), PONV (postoperative nausea and vomiting), Primary localized osteoarthritis of right hip (08/26/2016), Primary osteoarthritis of right hip (08/26/2016), Pulmonary collapse (07/30/2010), PULMONARY NODULE (07/30/2010), Rash and nonspecific skin eruption (01/02/2015), Right middle lobe syndrome (01/30/2014), Sarcoidosis, Sarcoidosis of lung ([place]), Vaginal atrophy (09/24/2011), and Visual field defect nasal step (01/02/2015).   reports that she has never smoked. She has never used smokeless tobacco.  Past Surgical History:  Procedure Laterality Date   BACK SURGERY  2022   EYE SURGERY     FOOT SURGERY Left 06/2005   bunion hammer toe   HEMORRHOID SURGERY  12/1972   INTRAOCULAR LENS INSERTION     right eye 10 /16 and left 06/16/2005   JOINT REPLACEMENT     KYPHOPLASTY N/A 06/27/2021   Procedure: LUMBAR 3 AND LUMBAR  5 KYPHOPLASTY;  Surgeon: Phylliss Bob, MD;  Location: [facility];   Service: Orthopedics;  Laterality: N/A;   KYPHOPLASTY N/A 09/04/2021   Procedure: THORACIC SEVEN, THORACIC EIGHT, THORACIC NINE, THORACIC TEN KYPHOPLASTY;  Surgeon: Phylliss Bob, MD;  Location: [facility];  Service: Orthopedics;  Laterality: N/A;   SKIN BIOPSY  01/2007   basal cell carcinoma removed from right lower leg    TOTAL HIP ARTHROPLASTY  06/2007   left   TOTAL HIP ARTHROPLASTY Right 08/26/2016   Procedure: TOTAL HIP ARTHROPLASTY ANTERIOR APPROACH;  Surgeon: Melrose Nakayama, MD;  Location: [facility];  Service: Orthopedics;  Laterality: Right;   TOTAL KNEE ARTHROPLASTY Right 10/2007   right    No Known Allergies  Immunization History  Administered Date(s) Administered   DTaP 12/10/1995   Hepatitis A 12/10/1999   Hepatitis A, Ped/Adol-2 Dose 12/10/1999   Influenza Split 04/15/2010, 04/21/2011, 04/29/2012, 04/11/2013, 04/28/2013, 04/11/2014   Influenza Whole 04/15/2010   Influenza,  High Dose Seasonal PF 04/17/2018, 04/21/2019, 05/14/2020   Influenza, Quadrivalent, Recombinant, Inj, Pf 04/17/2018, 04/30/2019, 05/18/2021   Influenza,inj,Quad PF,6+ Mos 04/28/2015, 05/07/2015, 04/11/2016, 04/26/2016, 05/09/2017   Influenza-Unspecified 05/07/2015, 04/11/2016, 05/09/2017   Moderna SARS-COV2 Booster Vaccination 06/13/2020   Moderna Sars-Covid-2 Vaccination 08/23/2019, 09/20/2019, 06/13/2020   Pneumococcal Conjugate-13 09/05/2013, 03/13/2014, 04/28/2017   Pneumococcal Polysaccharide-23 05/13/2004, 05/23/2004, 11/05/2010   Pneumococcal-Unspecified 08/11/2010, 03/11/2014   Td 02/08/2002, 02/08/2002, 03/03/2002, 03/03/2012, 09/06/2012   Tdap 08/11/2012, 09/06/2012   Zoster Recombinat (Shingrix) 12/30/2019, 02/29/2020   Zoster, Live 07/08/2006, 06/12/2007, 07/09/2007   Zoster, Unspecified 12/30/2019, 02/29/2020    Family History  Problem Relation Age of Onset   Diabetes Mother    Heart failure Mother    Hypertension Father    Diabetes Father    Heart disease Father    Heart attack  Father      Current Outpatient Medications:    acyclovir (ZOVIRAX) 400 MG tablet, TAKE ONE TABLET BY MOUTH TWICE DAILY, Disp: 60 tablet, Rfl: 2   amLODipine (NORVASC) 10 MG tablet, Take 10 mg by mouth daily., Disp: , Rfl:    Calcium Citrate-Vitamin D (CALCIUM + D PO), Take 1 tablet by mouth daily., Disp: , Rfl:    dicyclomine (BENTYL) 10 MG capsule, Take 1 capsule (10 mg total) by mouth 3 (three) times daily as needed for spasms., Disp: 90 capsule, Rfl: 0   gemfibrozil (LOPID) 600 MG tablet, Take 600 mg by mouth 2 (two) times daily before a meal., Disp: , Rfl:    HYDROcodone-acetaminophen (NORCO/VICODIN) 5-325 MG tablet, TAKE ONE TABLET EVERY 6 HOURS AS NEEDED FOR MODERATE PAIN, Disp: 30 tablet, Rfl: 0   levothyroxine (SYNTHROID, LEVOTHROID) 25 MCG tablet, Take 25 mcg by mouth daily. , Disp: , Rfl:    LORazepam (ATIVAN) 0.5 MG tablet, Take 0.5 mg by mouth daily as needed for anxiety or sleep., Disp: , Rfl:    losartan (COZAAR) 50 MG tablet, Take 50 mg by mouth daily., Disp: , Rfl:    Multiple Vitamin (MULTIVITAMIN) capsule, Take 1 capsule by mouth daily., Disp: , Rfl:    ondansetron (ZOFRAN) 8 MG tablet, Take 1 tablet (8 mg total) by mouth every 8 (eight) hours as needed., Disp: 30 tablet, Rfl: 0   pantoprazole (PROTONIX) 20 MG tablet, Take 20 mg by mouth daily., Disp: , Rfl:    polyethylene glycol powder (GLYCOLAX/MIRALAX) 17 GM/SCOOP powder, Take 17 g by mouth at bedtime., Disp: , Rfl:    pregabalin (LYRICA) 25 MG capsule, Take 1 capsule (25 mg total) by mouth daily., Disp: 30 capsule, Rfl: 0      Objective:   Vitals:   04/10/22 1007  BP: 130/78  Pulse: 79  Temp: 97.9 F (36.6 C)  TempSrc: Oral  SpO2: 98%  Weight: 110 lb 3.2 oz (50 kg)  Height: _0  (1.549 m)    Estimated body mass index is 20.82 kg/m as calculated from the following:   Height as of this encounter: _1  (1.549 m).   Weight as of this encounter: 110 lb 3.2 oz (50 kg).  _2 @  Filed Weights    04/10/22 1007  Weight: 110 lb 3.2 oz (50 kg)     Physical Exam    General: No distress. Looks well, frail, walker Neuro: Alert and Oriented x 3. GCS 15. Speech normal Psych: Pleasant Resp:  Barrel Chest - no.  Wheeze - no, Crackles - no, No overt respiratory distress CVS: Normal heart sounds. Murmurs - no Ext: Stigmata of Connective Tissue  Disease - no HEENT: Normal upper airway. PEERL +. No post nasal drip        Assessment:       ICD-10-CM   1. Sarcoidosis  D86.9     2. Right middle lobe syndrome  J98.19     3. Closed wedge compression fracture of T7 vertebra, initial encounter ([place])  S22.060A     4. Multiple myeloma not having achieved remission ([place])  C90.00          Plan:     Patient Instructions     ICD-10-CM   1. Sarcoidosis  D86.9     2. Right middle lobe syndrome  J98.19     3. Closed wedge compression fracture of T7 vertebra, initial encounter ([place])  S22.060A     4. Multiple myeloma not having achieved remission ([facility])  C90.00         Sarcoidosis appears stable and in symptom remission. No sarcoid symptoms. No cough.  CT Chest march 2023 show overall improvement since 2020 thought RML collapse perissts (no visible on CXR though)  - Off prednisone as of summer 2023 approx June 2023 - Glad no evidence of withdrawal - noted steroid dosing with myeloma Rx  Plan  - monitor off prednisone   T-spine wedge fracture - T3, T4, T7, T8 sever, T9 severe, T10 moderate  Opioid requiring back pain with reduced mobility Myleoma recent history  - haing fatigue wth myeloma RX  Plan - Per oncology, and PCP    Follow-up 6 months with Jessenya Berdan - can be video visit if you like; 24 min   SIGNATURE    Dr. Brand Males, M.D., F.C.C.P,  Pulmonary and Critical Care Medicine Staff Physician, [facility] Director - Interstitial Lung Disease  Program  [facility] [place],  [place], [address]  Pager: [phone], If no answer or between  15:00h - 7:00h: call [phone] Telephone: [phone]  10:28 AM 04/10/2022

## 2022-04-10 ENCOUNTER — Encounter: Payer: Self-pay | Admitting: Internal Medicine

## 2022-04-10 ENCOUNTER — Ambulatory Visit: Payer: Medicare PPO | Admitting: Internal Medicine

## 2022-04-10 VITALS — BP 130/78 | HR 79 | Temp 97.9°F | Ht 61.0 in | Wt 110.2 lb

## 2022-04-10 DIAGNOSIS — J9819 Other pulmonary collapse: Secondary | ICD-10-CM

## 2022-04-10 DIAGNOSIS — D869 Sarcoidosis, unspecified: Secondary | ICD-10-CM | POA: Diagnosis not present

## 2022-04-10 DIAGNOSIS — S22060A Wedge compression fracture of T7-T8 vertebra, initial encounter for closed fracture: Secondary | ICD-10-CM

## 2022-04-10 DIAGNOSIS — C9000 Multiple myeloma not having achieved remission: Secondary | ICD-10-CM

## 2022-04-11 ENCOUNTER — Inpatient Hospital Stay: Payer: Medicare PPO | Admitting: Physician Assistant

## 2022-04-11 ENCOUNTER — Inpatient Hospital Stay: Payer: Medicare PPO

## 2022-04-11 ENCOUNTER — Other Ambulatory Visit: Payer: Self-pay

## 2022-04-11 ENCOUNTER — Other Ambulatory Visit: Payer: Self-pay | Admitting: Hematology and Oncology

## 2022-04-11 ENCOUNTER — Inpatient Hospital Stay: Payer: Medicare PPO | Attending: Hematology and Oncology

## 2022-04-11 VITALS — BP 151/59 | HR 72 | Temp 97.7°F | Resp 16 | Ht 61.0 in | Wt 113.0 lb

## 2022-04-11 DIAGNOSIS — T451X5A Adverse effect of antineoplastic and immunosuppressive drugs, initial encounter: Secondary | ICD-10-CM | POA: Insufficient documentation

## 2022-04-11 DIAGNOSIS — Z800 Family history of malignant neoplasm of digestive organs: Secondary | ICD-10-CM | POA: Diagnosis not present

## 2022-04-11 DIAGNOSIS — E538 Deficiency of other specified B group vitamins: Secondary | ICD-10-CM

## 2022-04-11 DIAGNOSIS — G620 Drug-induced polyneuropathy: Secondary | ICD-10-CM | POA: Insufficient documentation

## 2022-04-11 DIAGNOSIS — C9000 Multiple myeloma not having achieved remission: Secondary | ICD-10-CM | POA: Diagnosis present

## 2022-04-11 DIAGNOSIS — Z5112 Encounter for antineoplastic immunotherapy: Secondary | ICD-10-CM | POA: Diagnosis present

## 2022-04-11 DIAGNOSIS — Z79899 Other long term (current) drug therapy: Secondary | ICD-10-CM | POA: Diagnosis not present

## 2022-04-11 LAB — CMP (CANCER CENTER ONLY)
ALT: 6 U/L (ref 0–44)
AST: 15 U/L (ref 15–41)
Albumin: 4.2 g/dL (ref 3.5–5.0)
Alkaline Phosphatase: 95 U/L (ref 38–126)
Anion gap: 6 (ref 5–15)
BUN: 18 mg/dL (ref 8–23)
CO2: 22 mmol/L (ref 22–32)
Calcium: 9.1 mg/dL (ref 8.9–10.3)
Chloride: 105 mmol/L (ref 98–111)
Creatinine: 0.7 mg/dL (ref 0.44–1.00)
GFR, Estimated: >60 mL/min (ref >60)
Glucose, Bld: 93 mg/dL (ref 70–99)
Potassium: 4.5 mmol/L (ref 3.5–5.1)
Sodium: 133 mmol/L — ABNORMAL LOW (ref 135–145)
Total Bilirubin: 0.3 mg/dL (ref 0.3–1.2)
Total Protein: 7.1 g/dL (ref 6.5–8.1)

## 2022-04-11 LAB — LACTATE DEHYDROGENASE: LDH: 136 U/L (ref 98–192)

## 2022-04-11 LAB — CBC WITH DIFFERENTIAL (CANCER CENTER ONLY)
Abs Immature Granulocytes: 0.01 10*3/uL (ref 0.00–0.07)
Basophils Absolute: 0 10*3/uL (ref 0.0–0.1)
Basophils Relative: 1 %
Eosinophils Absolute: 0.1 10*3/uL (ref 0.0–0.5)
Eosinophils Relative: 1 %
HCT: 28.9 % — ABNORMAL LOW (ref 36.0–46.0)
Hemoglobin: 10.2 g/dL — ABNORMAL LOW (ref 12.0–15.0)
Immature Granulocytes: 0 %
Lymphocytes Relative: 19 %
Lymphs Abs: 0.7 10*3/uL (ref 0.7–4.0)
MCH: 32.9 pg (ref 26.0–34.0)
MCHC: 35.3 g/dL (ref 30.0–36.0)
MCV: 93.2 fL (ref 80.0–100.0)
Monocytes Absolute: 0.5 10*3/uL (ref 0.1–1.0)
Monocytes Relative: 14 %
Neutro Abs: 2.3 10*3/uL (ref 1.7–7.7)
Neutrophils Relative %: 65 %
Platelet Count: 223 10*3/uL (ref 150–400)
RBC: 3.1 MIL/uL — ABNORMAL LOW (ref 3.87–5.11)
RDW: 12.8 % (ref 11.5–15.5)
WBC Count: 3.6 10*3/uL — ABNORMAL LOW (ref 4.0–10.5)
nRBC: 0 % (ref 0.0–0.2)

## 2022-04-11 LAB — VITAMIN B12: Vitamin B-12: 290 pg/mL (ref 180–914)

## 2022-04-11 MED ORDER — DARATUMUMAB-HYALURONIDASE-FIHJ 1800-30000 MG-UT/15ML ~~LOC~~ SOLN
1800.0000 mg | Freq: Once | SUBCUTANEOUS | Status: AC
  Administered 2022-04-11: 1800 mg via SUBCUTANEOUS
  Filled 2022-04-11: qty 15

## 2022-04-11 MED ORDER — DEXAMETHASONE 4 MG PO TABS
20.0000 mg | ORAL_TABLET | Freq: Once | ORAL | Status: AC
  Administered 2022-04-11: 20 mg via ORAL
  Filled 2022-04-11: qty 5

## 2022-04-11 MED ORDER — DIPHENHYDRAMINE HCL 25 MG PO CAPS
50.0000 mg | ORAL_CAPSULE | Freq: Once | ORAL | Status: AC
  Administered 2022-04-11: 50 mg via ORAL
  Filled 2022-04-11: qty 2

## 2022-04-11 MED ORDER — ACETAMINOPHEN 325 MG PO TABS
650.0000 mg | ORAL_TABLET | Freq: Once | ORAL | Status: AC
  Administered 2022-04-11: 650 mg via ORAL
  Filled 2022-04-11: qty 2

## 2022-04-11 MED ORDER — CYANOCOBALAMIN 1000 MCG/ML IJ SOLN
1000.0000 ug | Freq: Once | INTRAMUSCULAR | Status: AC
  Administered 2022-04-11: 1000 ug via INTRAMUSCULAR
  Filled 2022-04-11: qty 1

## 2022-04-11 NOTE — Progress Notes (Signed)
[name] Page [phone]   [phone]  PROGRESS NOTE  Patient Care Team: Burnard Bunting, MD as PCP - General (Internal Medicine)  Hematological/Oncological History # IgG Lambda Multiple Myeloma 06/04/2021: WBC 5.5, Hgb 11.1, MCV 101.3, Plt 306 07/24/2021: M protein 2.3, IFE shows IgG Lambda monoclonal specificity. Cr 0.9 08/07/2021: Establish care with Dr. Lorenso Courier 09/27/2021: Bmbx showed hypercellular bone marrow with plasma cell neoplasm, increased number of atypical plasma cells averaging 20% of all cells in the aspirate 10/18/2021: Cycle 1 of Velcade/Dexamethasone 11/08/2021: Cycle 2 of Velcade/Dexamethasone 11/29/2021: Cycle 3 of Velcade/Dexamethasone 12/20/2021: Cycle 4 of Velcade/Dexamethasone 01/10/2022: Cycle 5 of Velcade/Dexamethasone 01/31/2022: Cycle 6 of Velcade/Dexamethasone 02/28/2022: Cycle 7 of Velcade/Dexamethasone. Dose reduced velcade due to neuropathy  03/14/2022: Cycle 1 Day 1 of Dara/Dex (velcade d/c due to neuropathy).  04/11/2022: Cycle 2 Day 1 of Dara/Dex  Interval History:  [name] Page 88 y.o. female with medical history significant for  IgG lambda multiple myeloma who presents for a follow up visit. The patient's last visit was on 03/28/2022. In the interim, patient has had no major changes in her health. She is due for Cycle 2, Day 1.  On exam today [name] Page is accompanied by her husband.  His father reports that her neuropathy is persistent affecting her hands and feet.  She has been taking Lyrica 25 mg once daily for the past week with no improvement of her neuropathy but her leg cramps have resolved.  Her energy levels are slightly improved and she continues to have a great appetite.  She denies nausea, vomiting or abdominal pain. Her bowel habits are unchanged without any recurrent episodes of diarrhea or constipation. She denies any bruising or active bleeding. Patient denies any fevers, chills, night sweats, shortness of  breath, chest pain or cough.She has no other complaints.  Rest of the 10 point ROS is below  MEDICAL HISTORY:  Past Medical History:  Diagnosis Date   Alopecia 10/28/2016   Anginal pain (HCC)    Arthritis    Cervical spondylosis    C3-4 AND C4-5   Chest pain 07/28/2008   H/O, normal stress nuclear EF 78%   Chronic cough 07/12/2014   Collapsed lung 07/2010   being treated at [facility] - in left lung   Complication of anesthesia    Cough 07/30/2010   Qualifier: Diagnosis of  By: Chase Caller MD, Murali     Cystocele 05/23/2015   Cystocele, midline 05/02/2013   DEEP VEIN THROMBOSIS/PHLEBITIS 07/30/2010   Qualifier: History of  By: Harvest Dark CMA, Jennifer     Dislocation closed, shoulder 02/2013   DVT (deep venous thrombosis) ([place]) 2006   Encounter for therapeutic drug monitoring 10/03/2014   GERD (gastroesophageal reflux disease)    occ   Headache    hx   High cholesterol    History of recurrent UTIs 09/24/2011   HYPERLIPIDEMIA 07/30/2010   Qualifier: Diagnosis of  By: Harvest Dark CMA, Jennifer     Hypertension    HYPERTENSION 07/30/2010   Qualifier: Diagnosis of  By: Harvest Dark CMA, Jennifer     Hypothyroidism    Nasal itching 01/30/2014   Osteopenia 05/23/2015   Peripheral vascular disease ([place]) 2006   dvt's and 50 yrs ago   Pleuritic pain 12/17/2015   PONV (postoperative nausea and vomiting)    Primary localized osteoarthritis of right hip 08/26/2016   Primary osteoarthritis of right hip 08/26/2016   Pulmonary collapse 07/30/2010   Qualifier: Diagnosis of  By: Chase Caller MD, Belva Crome  PULMONARY NODULE 07/30/2010   Qualifier: Diagnosis of  By: Chase Caller MD, Murali     Rash and nonspecific skin eruption 01/02/2015   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   Sarcoidosis    Sarcoidosis of lung ([place])    Vaginal atrophy 09/24/2011   Visual field defect nasal step 01/02/2015    SURGICAL HISTORY: Past Surgical History:  Procedure Laterality Date   BACK SURGERY  2022   EYE SURGERY      FOOT SURGERY Left 06/2005   bunion hammer toe   HEMORRHOID SURGERY  12/1972   INTRAOCULAR LENS INSERTION     right eye 10 /16 and left 06/16/2005   JOINT REPLACEMENT     KYPHOPLASTY N/A 06/27/2021   Procedure: LUMBAR 3 AND LUMBAR  5 KYPHOPLASTY;  Surgeon: Phylliss Bob, MD;  Location: [place];  Service: Orthopedics;  Laterality: N/A;   KYPHOPLASTY N/A 09/04/2021   Procedure: THORACIC SEVEN, THORACIC EIGHT, THORACIC NINE, THORACIC TEN KYPHOPLASTY;  Surgeon: Phylliss Bob, MD;  Location: [place];  Service: Orthopedics;  Laterality: N/A;   SKIN BIOPSY  01/2007   basal cell carcinoma removed from right lower leg    TOTAL HIP ARTHROPLASTY  06/2007   left   TOTAL HIP ARTHROPLASTY Right 08/26/2016   Procedure: TOTAL HIP ARTHROPLASTY ANTERIOR APPROACH;  Surgeon: Melrose Nakayama, MD;  Location: [place];  Service: Orthopedics;  Laterality: Right;   TOTAL KNEE ARTHROPLASTY Right 10/2007   right    SOCIAL HISTORY: Social History   Socioeconomic History   Marital status: Married    Spouse name: Not on file   Number of children: Not on file   Years of education: Not on file   Highest education level: Not on file  Occupational History   Occupation: retired    Fish farm manager: RETIRED  Tobacco Use   Smoking status: Never   Smokeless tobacco: Never  Vaping Use   Vaping Use: Never used  Substance and Sexual Activity   Alcohol use: Not Currently    Comment: BEER/glass of wine A DAY   Drug use: No   Sexual activity: Never  Other Topics Concern   Not on file  Social History Narrative   Not on file   Social Determinants of Health   Financial Resource Strain: Not on file  Food Insecurity: Not on file  Transportation Needs: Not on file  Physical Activity: Not on file  Stress: Not on file  Social Connections: Not on file  Intimate Partner Violence: Not on file    FAMILY HISTORY: Family History  Problem Relation Age of Onset   Diabetes Mother    Heart failure Mother    Hypertension  Father    Diabetes Father    Heart disease Father    Heart attack Father     ALLERGIES:  has No Known Allergies.  MEDICATIONS:  Current Outpatient Medications  Medication Sig Dispense Refill   acyclovir (ZOVIRAX) 400 MG tablet TAKE ONE TABLET BY MOUTH TWICE DAILY 60 tablet 2   amLODipine (NORVASC) 10 MG tablet Take 10 mg by mouth daily.     Calcium Citrate-Vitamin D (CALCIUM + D PO) Take 1 tablet by mouth daily.     dicyclomine (BENTYL) 10 MG capsule Take 1 capsule (10 mg total) by mouth 3 (three) times daily as needed for spasms. 90 capsule 0   gemfibrozil (LOPID) 600 MG tablet Take 600 mg by mouth 2 (two) times daily before a meal.     HYDROcodone-acetaminophen (NORCO/VICODIN) 5-325 MG tablet TAKE ONE TABLET  EVERY 6 HOURS AS NEEDED FOR MODERATE PAIN 30 tablet 0   levothyroxine (SYNTHROID, LEVOTHROID) 25 MCG tablet Take 25 mcg by mouth daily.      LORazepam (ATIVAN) 0.5 MG tablet Take 0.5 mg by mouth daily as needed for anxiety or sleep.     losartan (COZAAR) 50 MG tablet Take 50 mg by mouth daily.     Multiple Vitamin (MULTIVITAMIN) capsule Take 1 capsule by mouth daily.     ondansetron (ZOFRAN) 8 MG tablet Take 1 tablet (8 mg total) by mouth every 8 (eight) hours as needed. 30 tablet 0   pantoprazole (PROTONIX) 20 MG tablet Take 20 mg by mouth daily.     polyethylene glycol powder (GLYCOLAX/MIRALAX) 17 GM/SCOOP powder Take 17 g by mouth at bedtime.     pregabalin (LYRICA) 25 MG capsule Take 1 capsule (25 mg total) by mouth daily. 30 capsule 0   No current facility-administered medications for this visit.   Facility-Administered Medications Ordered in Other Visits  Medication Dose Route Frequency Provider Last Rate Last Admin   daratumumab-hyaluronidase-fihj (DARZALEX FASPRO) 1800-30000 MG-UT/15ML chemo SQ injection 1,800 mg  1,800 mg Subcutaneous Once Orson Slick, MD        REVIEW OF SYSTEMS:   Constitutional: ( - ) fevers, ( - )  chills , ( - ) night sweats Eyes: ( - )  blurriness of vision, ( - ) double vision, ( - ) watery eyes Ears, nose, mouth, throat, and face: ( - ) mucositis, ( - ) sore throat Respiratory: ( - ) cough, ( - ) dyspnea, ( - ) wheezes Cardiovascular: ( - ) palpitation, ( - ) chest discomfort, ( - ) lower extremity swelling Gastrointestinal:  ( - ) nausea, ( - ) heartburn, ( - ) change in bowel habits Skin: ( - ) abnormal skin rashes Lymphatics: ( - ) new lymphadenopathy, ( - ) easy bruising Neurological: (+ ) numbness, ( +) tingling, ( - ) new weaknesses Behavioral/Psych: ( - ) mood change, ( - ) new changes  All other systems were reviewed with the patient and are negative.  PHYSICAL EXAMINATION: ECOG PERFORMANCE STATUS: 2 - Symptomatic, <50% confined to bed  Vitals:   04/11/22 1014  BP: (!) 151/59  Pulse: 72  Resp: 16  Temp: 97.7 F (36.5 C)  SpO2: 100%   Filed Weights   04/11/22 1014  Weight: 113 lb (51.3 kg)    GENERAL: Well-appearing elderly Caucasian female, alert, no distress and comfortable SKIN: skin color, texture, turgor are normal, no rashes or significant lesions EYES: conjunctiva are pink and non-injected, sclera clear LUNGS: clear to auscultation and percussion with normal breathing effort HEART: regular rate & rhythm and no murmurs and no lower extremity edema Musculoskeletal: no cyanosis of digits and no clubbing  PSYCH: alert & oriented x 3, fluent speech NEURO: no focal motor/sensory deficits  LABORATORY DATA:  I have reviewed the data as listed    Latest Ref Rng & Units 04/11/2022    9:56 AM 04/04/2022    2:05 PM 03/28/2022   12:56 PM  CBC  WBC 4.0 - 10.5 K/uL 3.6  4.1  4.2   Hemoglobin 12.0 - 15.0 g/dL 10.2  9.7  9.9   Hematocrit 36.0 - 46.0 % 28.9  27.2  28.1   Platelets 150 - 400 K/uL 223  225  253        Latest Ref Rng & Units 04/11/2022    9:56 AM 04/04/2022  2:05 PM 03/28/2022   12:56 PM  CMP  Glucose 70 - 99 mg/dL 93  118  108   BUN 8 - 23 mg/dL _0 Creatinine 0.44 - 1.00  mg/dL 0.70  0.56  0.82   Sodium 135 - 145 mmol/L 133  131  133   Potassium 3.5 - 5.1 mmol/L 4.5  4.5  4.5   Chloride 98 - 111 mmol/L 105  103  104   CO2 22 - 32 mmol/L _1 Calcium 8.9 - 10.3 mg/dL 9.1  8.9  9.0   Total Protein 6.5 - 8.1 g/dL 7.1  6.5  6.8   Total Bilirubin 0.3 - 1.2 mg/dL 0.3  0.3  0.4   Alkaline Phos 38 - 126 U/L 95  84  82   AST 15 - 41 U/L _2 ALT 0 - 44 U/L _3 Lab Results  Component Value Date   MPROTEIN 0.5 (H) 03/14/2022   MPROTEIN 0.5 (H) 02/14/2022   MPROTEIN 0.7 (H) 01/24/2022   Lab Results  Component Value Date   KPAFRELGTCHN 13.7 03/14/2022   KPAFRELGTCHN 15.4 02/14/2022   KPAFRELGTCHN 14.5 01/24/2022   LAMBDASER 9.8 03/14/2022   LAMBDASER 11.3 02/14/2022   LAMBDASER 9.1 01/24/2022   KAPLAMBRATIO 1.40 03/14/2022   KAPLAMBRATIO 1.36 02/14/2022   KAPLAMBRATIO 1.59 01/24/2022   RADIOGRAPHIC STUDIES: No results found.  ASSESSMENT & PLAN [name] Page 88 y.o. female with medical history significant for newly diagnosed IgG lambda multiple myeloma who presents for a follow up visit.  #IgG Lambda Multiple Myeloma -- Diagnosis confirmed by bone marrow biopsy on 09/27/2021 which shows evidence of 20% involvement by plasma cells.  Additionally has anemia, macrocytosis, leukopenia, and concern for lytic lesions on bone imaging --Prognostic panel shows a deletion 17 with a TP53 mutation --Started Cycle 1 Day 1 of Velcade/Dex on 10/18/2021 --Switched to Dara/Dex on 03/14/2022 due to neuropathy secondary to Velcade.  Plan: --Due for Cycle 2 Day 1 of Dara/Dex today  --Labs from 03/14/2022 showed M protein 0.5 with a normal kappa lambda ratio --Labs today show white blood cell count 3.6, hemoglobin 10.2, MCV 93.2, and platelets of 223. Cr 0.70.  --Continue with weekly labs and Dara/Dex treatments and RTC in 2 weeks for a follow up visit.   # H/O Confusion --decreased dexamethasone to 20 mg on infusion days.  --Symptoms resolved.    #Leukopenia/Macrocytic anemia/Vitamin B12 deficiency: --Secondary to chemotherapy and multiple myeloma --Today, hemoglobin 10.2, MCV 93.2, --Received vitamin B12 injection 1000 mcg weekly x 4 doses. Will continue now with monthly injections. Pending repeat B12 level today.   #Right rib pain, improved: --CT chest from 10/25/2021 that didn't show any new or progressive disease.   #Abdominal cramping--resolved: --Symptoms occur after velcade injection.  --Has Bentyl as needed  #Neuropathy: --Grade 2 involving finger tips and feet, worsening --Secondary to velcade  --Currently on Lyrica 25 mg daily. Consider increasing to 50 mg if 25 mg dose provides minimal relief.   #Supportive Care -- chemotherapy education complete -- port placement not required.  -- zofran 26m q8H PRN and compazine 121mPO q6H for nausea -- acyclovir 40072mO BID for VCZ prophylaxis. Patient was not taking medication as prescribed. Advised importance of taking medication consistently.  -- We will start Zometa as soon as is feasible after dental clearance  Orders Placed This Encounter  Procedures  Vitamin B12    Standing Status:   Future    Number of Occurrences:   1    Standing Expiration Date:   04/11/2023   All questions were answered. The patient knows to call the clinic with any problems, questions or concerns.  I have spent a total of 30 minutes minutes of face-to-face and non-face-to-face time, preparing to see the patient, performing a medically appropriate examination, counseling and educating the patient, ordering medications/tests, documenting clinical information in the electronic health record, and care coordination.   Dede Query PA-C Dept of Hematology and [place] at [facility] Phone: [phone]   04/11/2022 12:16 PM

## 2022-04-11 NOTE — Patient Instructions (Signed)
[facility] MEDICAL [facility]COLOGY  Discharge Instructions: Thank you for choosing [facility] to provide your oncology and hematology care.   If you have a lab appointment with the Cancer Center, please go directly to the Cancer Center and check in at the registration area.   Wear comfortable clothing and clothing appropriate for easy access to any Portacath or PICC line.   We strive to give you quality time with your provider. You may need to reschedule your appointment if you arrive late (15 or more minutes).  Arriving late affects you and other patients whose appointments are after yours.  Also, if you miss three or more appointments without notifying the office, you may be dismissed from the clinic at the provider's discretion.      For prescription refill requests, have your pharmacy contact our office and allow 72 hours for refills to be completed.    Today you received the following chemotherapy and/or immunotherapy agents darzalex faspro      To help prevent nausea and vomiting after your treatment, we encourage you to take your nausea medication as directed.  BELOW ARE SYMPTOMS THAT SHOULD BE REPORTED IMMEDIATELY: *FEVER GREATER THAN 100.4 F (38 C) OR HIGHER *CHILLS OR SWEATING *NAUSEA AND VOMITING THAT IS NOT CONTROLLED WITH YOUR NAUSEA MEDICATION *UNUSUAL SHORTNESS OF BREATH *UNUSUAL BRUISING OR BLEEDING *URINARY PROBLEMS (pain or burning when urinating, or frequent urination) *BOWEL PROBLEMS (unusual diarrhea, constipation, pain near the anus) TENDERNESS IN MOUTH AND THROAT WITH OR WITHOUT PRESENCE OF ULCERS (sore throat, sores in mouth, or a toothache) UNUSUAL RASH, SWELLING OR PAIN  UNUSUAL VAGINAL DISCHARGE OR ITCHING   Items with * indicate a potential emergency and should be followed up as soon as possible or go to the Emergency Department if any problems should occur.  Please show the CHEMOTHERAPY ALERT CARD or IMMUNOTHERAPY ALERT CARD at  check-in to the Emergency Department and triage nurse.  Should you have questions after your visit or need to cancel or reschedule your appointment, please contact [facility] MEDICAL ONCOLOG[facility]  Dept: [phone]  and follow the prompts.  Office hours are 8:00 a.m. to 4:30 p.m. Monday - Friday. Please note that voicemails left after 4:00 p.m. may not be returned until the following business day.  We are closed weekends and major holidays. You have access to a nurse at all times for urgent questions. Please call the main number to the clinic Dept: [phone] and follow the prompts.   For any non-urgent questions, you may also contact your provider using MyChart. We now offer e-Visits for anyone 18 and older to request care online for non-urgent symptoms. For details visit [email or web address].   Also download the MyChart app! Go to the app store, search "MyChart", open the app, select [facility], and log in with your MyChart username and password.  Masks are optional in the cancer centers. If you would like for your care team to wear a mask while they are taking care of you, please let them know. You may have one support person who is at least 86 years old accompany you for your appointments. 

## 2022-04-13 ENCOUNTER — Other Ambulatory Visit: Payer: Self-pay | Admitting: Hematology and Oncology

## 2022-04-13 DIAGNOSIS — C9000 Multiple myeloma not having achieved remission: Secondary | ICD-10-CM

## 2022-04-13 NOTE — Progress Notes (Signed)
ON PATHWAY REGIMEN - Multiple Myeloma and Other Plasma Cell Dyscrasias  No Change  Continue With Treatment as Ordered.  Original Decision Date/Time: 03/10/2022 09:47     Cycle 1 and 2: A cycle is every 28 days:     Lenalidomide      Dexamethasone      Daratumumab    Cycles 3 through 6: A cycle is every 28 days:     Lenalidomide      Dexamethasone      Daratumumab    Cycles 7 and beyond: A cycle is every 28 days:     Lenalidomide      Dexamethasone      Daratumumab   **Always confirm dose/schedule in your pharmacy ordering system**  Patient Characteristics: Multiple Myeloma, Newly Diagnosed, Transplant Ineligible or Refused, Standard Risk Disease Classification: Multiple Myeloma R-ISS Staging: II Therapeutic Status: Newly Diagnosed Is Patient Eligible for Transplant<= Transplant Ineligible or Refused Risk Status: Standard Risk Intent of Therapy: Curative Intent, Discussed with Patient

## 2022-04-15 ENCOUNTER — Telehealth: Payer: Self-pay | Admitting: Hematology and Oncology

## 2022-04-15 LAB — MULTIPLE MYELOMA PANEL, SERUM
Albumin SerPl Elph-Mcnc: 3.7 g/dL (ref 2.9–4.4)
Albumin/Glob SerPl: 1.4 (ref 0.7–1.7)
Alpha 1: 0.2 g/dL (ref 0.0–0.4)
Alpha2 Glob SerPl Elph-Mcnc: 0.8 g/dL (ref 0.4–1.0)
B-Globulin SerPl Elph-Mcnc: 1.3 g/dL (ref 0.7–1.3)
Gamma Glob SerPl Elph-Mcnc: 0.3 g/dL — ABNORMAL LOW (ref 0.4–1.8)
Globulin, Total: 2.7 g/dL (ref 2.2–3.9)
IgA: 13 mg/dL — ABNORMAL LOW (ref 64–422)
IgG (Immunoglobin G), Serum: 761 mg/dL (ref 586–1602)
IgM (Immunoglobulin M), Srm: 15 mg/dL — ABNORMAL LOW (ref 26–217)
M Protein SerPl Elph-Mcnc: 0.1 g/dL — ABNORMAL HIGH
Total Protein ELP: 6.4 g/dL (ref 6.0–8.5)

## 2022-04-15 LAB — KAPPA/LAMBDA LIGHT CHAINS
Kappa free light chain: 7.5 mg/L (ref 3.3–19.4)
Kappa, lambda light chain ratio: 1.53 (ref 0.26–1.65)
Lambda free light chains: 4.9 mg/L — ABNORMAL LOW (ref 5.7–26.3)

## 2022-04-15 NOTE — Telephone Encounter (Signed)
Per w[place]que called and spoke to pt about appointments    they were confirmed

## 2022-04-16 ENCOUNTER — Other Ambulatory Visit: Payer: Self-pay

## 2022-04-16 LAB — BETA 2 MICROGLOBULIN, SERUM: Beta-2 Microglobulin: 2.2 mg/L (ref 0.6–2.4)

## 2022-04-17 ENCOUNTER — Other Ambulatory Visit: Payer: Self-pay

## 2022-04-18 ENCOUNTER — Inpatient Hospital Stay: Payer: Medicare PPO

## 2022-04-18 ENCOUNTER — Other Ambulatory Visit: Payer: Medicare PPO

## 2022-04-18 ENCOUNTER — Ambulatory Visit: Payer: Medicare PPO

## 2022-04-18 VITALS — BP 170/85 | HR 72 | Temp 97.8°F | Resp 18 | Wt 112.8 lb

## 2022-04-18 DIAGNOSIS — Z5112 Encounter for antineoplastic immunotherapy: Secondary | ICD-10-CM | POA: Diagnosis not present

## 2022-04-18 DIAGNOSIS — C9000 Multiple myeloma not having achieved remission: Secondary | ICD-10-CM

## 2022-04-18 LAB — CMP (CANCER CENTER ONLY)
ALT: 7 U/L (ref 0–44)
AST: 14 U/L — ABNORMAL LOW (ref 15–41)
Albumin: 4.1 g/dL (ref 3.5–5.0)
Alkaline Phosphatase: 83 U/L (ref 38–126)
Anion gap: 8 (ref 5–15)
BUN: 22 mg/dL (ref 8–23)
CO2: 19 mmol/L — ABNORMAL LOW (ref 22–32)
Calcium: 9.3 mg/dL (ref 8.9–10.3)
Chloride: 106 mmol/L (ref 98–111)
Creatinine: 0.81 mg/dL (ref 0.44–1.00)
GFR, Estimated: >60 mL/min (ref >60)
Glucose, Bld: 146 mg/dL — ABNORMAL HIGH (ref 70–99)
Potassium: 4.1 mmol/L (ref 3.5–5.1)
Sodium: 133 mmol/L — ABNORMAL LOW (ref 135–145)
Total Bilirubin: 0.3 mg/dL (ref 0.3–1.2)
Total Protein: 6.8 g/dL (ref 6.5–8.1)

## 2022-04-18 LAB — CBC WITH DIFFERENTIAL (CANCER CENTER ONLY)
Abs Immature Granulocytes: 0.01 10*3/uL (ref 0.00–0.07)
Basophils Absolute: 0 10*3/uL (ref 0.0–0.1)
Basophils Relative: 1 %
Eosinophils Absolute: 0 10*3/uL (ref 0.0–0.5)
Eosinophils Relative: 1 %
HCT: 28.4 % — ABNORMAL LOW (ref 36.0–46.0)
Hemoglobin: 9.9 g/dL — ABNORMAL LOW (ref 12.0–15.0)
Immature Granulocytes: 0 %
Lymphocytes Relative: 19 %
Lymphs Abs: 0.7 10*3/uL (ref 0.7–4.0)
MCH: 32.5 pg (ref 26.0–34.0)
MCHC: 34.9 g/dL (ref 30.0–36.0)
MCV: 93.1 fL (ref 80.0–100.0)
Monocytes Absolute: 0.4 10*3/uL (ref 0.1–1.0)
Monocytes Relative: 11 %
Neutro Abs: 2.6 10*3/uL (ref 1.7–7.7)
Neutrophils Relative %: 68 %
Platelet Count: 236 10*3/uL (ref 150–400)
RBC: 3.05 MIL/uL — ABNORMAL LOW (ref 3.87–5.11)
RDW: 13.1 % (ref 11.5–15.5)
WBC Count: 3.8 10*3/uL — ABNORMAL LOW (ref 4.0–10.5)
nRBC: 0 % (ref 0.0–0.2)

## 2022-04-18 LAB — LACTATE DEHYDROGENASE: LDH: 126 U/L (ref 98–192)

## 2022-04-18 MED ORDER — SODIUM CHLORIDE 0.9 % IV SOLN: Freq: Once | INTRAVENOUS | Status: DC

## 2022-04-18 MED ORDER — SODIUM CHLORIDE 0.9 % IV SOLN: 40.0000 mg | Freq: Once | INTRAVENOUS | Status: DC

## 2022-04-18 MED ORDER — DIPHENHYDRAMINE HCL 25 MG PO CAPS
50.0000 mg | ORAL_CAPSULE | Freq: Once | ORAL | Status: AC
  Administered 2022-04-18: 50 mg via ORAL
  Filled 2022-04-18: qty 2

## 2022-04-18 MED ORDER — ACETAMINOPHEN 325 MG PO TABS
650.0000 mg | ORAL_TABLET | Freq: Once | ORAL | Status: AC
  Administered 2022-04-18: 650 mg via ORAL
  Filled 2022-04-18: qty 2

## 2022-04-18 MED ORDER — DARATUMUMAB-HYALURONIDASE-FIHJ 1800-30000 MG-UT/15ML ~~LOC~~ SOLN
1800.0000 mg | Freq: Once | SUBCUTANEOUS | Status: AC
  Administered 2022-04-18: 1800 mg via SUBCUTANEOUS
  Filled 2022-04-18: qty 15

## 2022-04-18 MED ORDER — DEXAMETHASONE 4 MG PO TABS
20.0000 mg | ORAL_TABLET | Freq: Once | ORAL | Status: AC
  Administered 2022-04-18: 20 mg via ORAL
  Filled 2022-04-18: qty 5

## 2022-04-18 NOTE — Progress Notes (Signed)
Pt complaining of continued episodes of leg cramping/tingling pain. RN informed [place] PA. Per PA pt to increase Lyrica to BID. Pt expressed understanding.

## 2022-04-18 NOTE — Patient Instructions (Signed)
[facility] MEDICAL [facility]COLOG[facility]  Discharge Instructions: Thank you for choosing [facility] to provide your oncology and hematology care.   If you have a lab appointment with the Cancer Center, please go directly to the Cancer Center and check in at the registration area.   Wear comfortable clothing and clothing appropriate for easy access to any Portacath or PICC line.   We strive to give you quality time with your provider. You may need to reschedule your appointment if you arrive late (15 or more minutes).  Arriving late affects you and other patients whose appointments are after yours.  Also, if you miss three or more appointments without notifying the office, you may be dismissed from the clinic at the provider's discretion.      For prescription refill requests, have your pharmacy contact our office and allow 72 hours for refills to be completed.    Today you received the following chemotherapy and/or immunotherapy agents: Dara Faspro.      To help prevent nausea and vomiting after your treatment, we encourage you to take your nausea medication as directed.  BELOW ARE SYMPTOMS THAT SHOULD BE REPORTED IMMEDIATELY: *FEVER GREATER THAN 100.4 F (38 C) OR HIGHER *CHILLS OR SWEATING *NAUSEA AND VOMITING THAT IS NOT CONTROLLED WITH YOUR NAUSEA MEDICATION *UNUSUAL SHORTNESS OF BREATH *UNUSUAL BRUISING OR BLEEDING *URINARY PROBLEMS (pain or burning when urinating, or frequent urination) *BOWEL PROBLEMS (unusual diarrhea, constipation, pain near the anus) TENDERNESS IN MOUTH AND THROAT WITH OR WITHOUT PRESENCE OF ULCERS (sore throat, sores in mouth, or a toothache) UNUSUAL RASH, SWELLING OR PAIN  UNUSUAL VAGINAL DISCHARGE OR ITCHING   Items with * indicate a potential emergency and should be followed up as soon as possible or go to the Emergency Department if any problems should occur.  Please show the CHEMOTHERAPY ALERT CARD or IMMUNOTHERAPY ALERT CARD at check-in  to the Emergency Department and triage nurse.  Should you have questions after your visit or need to cancel or reschedule your appointment, please contact [facility]ICAL [facility]COL[facility]  Dept: [phone]  and follow the prompts.  Office hours are 8:00 a.m. to 4:30 p.m. Monday - Friday. Please note that voicemails left after 4:00 p.m. may not be returned until the following business day.  We are closed weekends and major holidays. You have access to a nurse at all times for urgent questions. Please call the main number to the clinic Dept: [phone] and follow the prompts.   For any non-urgent questions, you may also contact your provider using MyChart. We now offer e-Visits for anyone 18 and older to request care online for non-urgent symptoms. For details visit [email or web address].   Also download the MyChart app! Go to the app store, search "MyChart", open the app, select [facility], and log in with your MyChart username and password.  Masks are optional in the cancer centers. If you would like for your care team to wear a mask while they are taking care of you, please let them know. You may have one support person who is at least 86 years old accompany you for your appointments. 

## 2022-04-21 ENCOUNTER — Other Ambulatory Visit: Payer: Self-pay | Admitting: Physician Assistant

## 2022-04-21 ENCOUNTER — Other Ambulatory Visit: Payer: Self-pay

## 2022-04-21 ENCOUNTER — Other Ambulatory Visit: Payer: Self-pay | Admitting: *Deleted

## 2022-04-21 MED ORDER — PREGABALIN 25 MG PO CAPS: 25.0000 mg | ORAL_CAPSULE | Freq: Two times a day (BID) | ORAL | 1 refills | Status: DC

## 2022-04-25 ENCOUNTER — Inpatient Hospital Stay: Payer: Medicare PPO

## 2022-04-25 ENCOUNTER — Inpatient Hospital Stay: Payer: Medicare PPO | Admitting: Hematology and Oncology

## 2022-04-25 VITALS — BP 135/65 | HR 76 | Temp 98.0°F | Resp 18

## 2022-04-25 DIAGNOSIS — Z5112 Encounter for antineoplastic immunotherapy: Secondary | ICD-10-CM | POA: Diagnosis not present

## 2022-04-25 DIAGNOSIS — C9000 Multiple myeloma not having achieved remission: Secondary | ICD-10-CM

## 2022-04-25 LAB — CMP (CANCER CENTER ONLY)
ALT: 10 U/L (ref 0–44)
AST: 18 U/L (ref 15–41)
Albumin: 4 g/dL (ref 3.5–5.0)
Alkaline Phosphatase: 91 U/L (ref 38–126)
Anion gap: 8 (ref 5–15)
BUN: 14 mg/dL (ref 8–23)
CO2: 21 mmol/L — ABNORMAL LOW (ref 22–32)
Calcium: 9.2 mg/dL (ref 8.9–10.3)
Chloride: 107 mmol/L (ref 98–111)
Creatinine: 0.73 mg/dL (ref 0.44–1.00)
GFR, Estimated: >60 mL/min (ref >60)
Glucose, Bld: 117 mg/dL — ABNORMAL HIGH (ref 70–99)
Potassium: 4.2 mmol/L (ref 3.5–5.1)
Sodium: 136 mmol/L (ref 135–145)
Total Bilirubin: 0.5 mg/dL (ref 0.3–1.2)
Total Protein: 7.2 g/dL (ref 6.5–8.1)

## 2022-04-25 LAB — CBC WITH DIFFERENTIAL (CANCER CENTER ONLY)
Abs Immature Granulocytes: 0.01 10*3/uL (ref 0.00–0.07)
Basophils Absolute: 0 10*3/uL (ref 0.0–0.1)
Basophils Relative: 0 %
Eosinophils Absolute: 0.1 10*3/uL (ref 0.0–0.5)
Eosinophils Relative: 1 %
HCT: 29.7 % — ABNORMAL LOW (ref 36.0–46.0)
Hemoglobin: 10.3 g/dL — ABNORMAL LOW (ref 12.0–15.0)
Immature Granulocytes: 0 %
Lymphocytes Relative: 13 %
Lymphs Abs: 0.9 10*3/uL (ref 0.7–4.0)
MCH: 32.5 pg (ref 26.0–34.0)
MCHC: 34.7 g/dL (ref 30.0–36.0)
MCV: 93.7 fL (ref 80.0–100.0)
Monocytes Absolute: 0.7 10*3/uL (ref 0.1–1.0)
Monocytes Relative: 10 %
Neutro Abs: 5.6 10*3/uL (ref 1.7–7.7)
Neutrophils Relative %: 76 %
Platelet Count: 264 10*3/uL (ref 150–400)
RBC: 3.17 MIL/uL — ABNORMAL LOW (ref 3.87–5.11)
RDW: 13.2 % (ref 11.5–15.5)
WBC Count: 7.3 10*3/uL (ref 4.0–10.5)
nRBC: 0 % (ref 0.0–0.2)

## 2022-04-25 LAB — LACTATE DEHYDROGENASE: LDH: 134 U/L (ref 98–192)

## 2022-04-25 MED ORDER — ACETAMINOPHEN 325 MG PO TABS
650.0000 mg | ORAL_TABLET | Freq: Once | ORAL | Status: AC
  Administered 2022-04-25: 650 mg via ORAL
  Filled 2022-04-25: qty 2

## 2022-04-25 MED ORDER — DIPHENHYDRAMINE HCL 25 MG PO CAPS
50.0000 mg | ORAL_CAPSULE | Freq: Once | ORAL | Status: AC
  Administered 2022-04-25: 50 mg via ORAL
  Filled 2022-04-25: qty 2

## 2022-04-25 MED ORDER — DEXAMETHASONE 4 MG PO TABS
20.0000 mg | ORAL_TABLET | Freq: Once | ORAL | Status: AC
  Administered 2022-04-25: 20 mg via ORAL
  Filled 2022-04-25: qty 5

## 2022-04-25 MED ORDER — DARATUMUMAB-HYALURONIDASE-FIHJ 1800-30000 MG-UT/15ML ~~LOC~~ SOLN
1800.0000 mg | Freq: Once | SUBCUTANEOUS | Status: AC
  Administered 2022-04-25: 1800 mg via SUBCUTANEOUS
  Filled 2022-04-25: qty 15

## 2022-04-25 NOTE — Progress Notes (Signed)
[place] [phone]   [phone]  PROGRESS NOTE  Patient Care Team: Burnard Bunting, MD as PCP - General (Internal Medicine)  Hematological/Oncological History # IgG Lambda Multiple Myeloma 06/04/2021: WBC 5.5, Hgb 11.1, MCV 101.3, Plt 306 07/24/2021: M protein 2.3, IFE shows IgG Lambda monoclonal specificity. Cr 0.9 08/07/2021: Establish care with Dr. Lorenso Courier 09/27/2021: Bmbx showed hypercellular bone marrow with plasma cell neoplasm, increased number of atypical plasma cells averaging 20% of all cells in the aspirate 10/18/2021: Cycle 1 of Velcade/Dexamethasone 11/08/2021: Cycle 2 of Velcade/Dexamethasone 11/29/2021: Cycle 3 of Velcade/Dexamethasone 12/20/2021: Cycle 4 of Velcade/Dexamethasone 01/10/2022: Cycle 5 of Velcade/Dexamethasone 01/31/2022: Cycle 6 of Velcade/Dexamethasone 02/28/2022: Cycle 7 of Velcade/Dexamethasone. Dose reduced velcade due to neuropathy  03/14/2022: Cycle 1 Day 1 of Dara/Dex (velcade d/c due to neuropathy).  04/11/2022: Cycle 2 Day 1 of Dara/Dex  Interval History:  [name] Page 88 y.o. female with medical history significant for  IgG lambda multiple myeloma who presents for a follow up visit. The patient's last visit was on 04/13/2022. In the interim, patient has had no major changes in her health. She is due for Cycle 2, Day 15.  On exam today [name] Page reports that things are "going okay".  She notes that she is holding steady.  Her weight is currently 110 pounds which is steady from the end of August.  She reports that she is getting by okay.  She notes that she is concerned that she may be hospice appropriate.  She notes that she is having less neuropathy and tingling after the Velcade has stopped.  She does continue to have some "strange sensation" in her heels.  She also continues to struggle with fatigue.  Patient denies any fevers, chills, night sweats, shortness of breath, chest pain or cough.She has no other  complaints.  Rest of the 10 point ROS is below.  Today we had a detailed discussion about hospice and treatment moving forward.  I expressed to her that she is doing excellent on therapy and I do not think hospice is appropriate at this time.  However if the patient noted that the treatments are to be too much or if she wanted to quit because certainly consider going that route.  I think she is managing the toxicity well of her current treatment and therefore is an excellent candidate for continued treatment.  She and her husband voiced her understanding and were agreeable to continuing with treatment at this time.  MEDICAL HISTORY:  Past Medical History:  Diagnosis Date   Alopecia 10/28/2016   Anginal pain (HCC)    Arthritis    Cervical spondylosis    C3-4 AND C4-5   Chest pain 07/28/2008   H/O, normal stress nuclear EF 78%   Chronic cough 07/12/2014   Collapsed lung 07/2010   being treated at [facility] - in left lung   Complication of anesthesia    Cough 07/30/2010   Qualifier: Diagnosis of  By: Chase Caller MD, Murali     Cystocele 05/23/2015   Cystocele, midline 05/02/2013   DEEP VEIN THROMBOSIS/PHLEBITIS 07/30/2010   Qualifier: History of  By: Harvest Dark CMA, Jennifer     Dislocation closed, shoulder 02/2013   DVT (deep venous thrombosis) ([place]) 2006   Encounter for therapeutic drug monitoring 10/03/2014   GERD (gastroesophageal reflux disease)    occ   Headache    hx   High cholesterol    History of recurrent UTIs 09/24/2011   HYPERLIPIDEMIA 07/30/2010   Qualifier: Diagnosis  of  By: Harvest Dark CMA, Jennifer     Hypertension    HYPERTENSION 07/30/2010   Qualifier: Diagnosis of  By: Harvest Dark CMA, Jennifer     Hypothyroidism    Nasal itching 01/30/2014   Osteopenia 05/23/2015   Peripheral vascular disease ([place]) 2006   dvt's and 50 yrs ago   Pleuritic pain 12/17/2015   PONV (postoperative nausea and vomiting)    Primary localized osteoarthritis of right hip 08/26/2016   Primary  osteoarthritis of right hip 08/26/2016   Pulmonary collapse 07/30/2010   Qualifier: Diagnosis of  By: Chase Caller MD, Murali     PULMONARY NODULE 07/30/2010   Qualifier: Diagnosis of  By: Chase Caller MD, Murali     Rash and nonspecific skin eruption 01/02/2015   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   Sarcoidosis    Sarcoidosis of lung ([place])    Vaginal atrophy 09/24/2011   Visual field defect nasal step 01/02/2015    SURGICAL HISTORY: Past Surgical History:  Procedure Laterality Date   BACK SURGERY  2022   EYE SURGERY     FOOT SURGERY Left 06/2005   bunion hammer toe   HEMORRHOID SURGERY  12/1972   INTRAOCULAR LENS INSERTION     right eye 10 /16 and left 06/16/2005   JOINT REPLACEMENT     KYPHOPLASTY N/A 06/27/2021   Procedure: LUMBAR 3 AND LUMBAR  5 KYPHOPLASTY;  Surgeon: Phylliss Bob, MD;  Location: [facility];  Service: Orthopedics;  Laterality: N/A;   KYPHOPLASTY N/A 09/04/2021   Procedure: THORACIC SEVEN, THORACIC EIGHT, THORACIC NINE, THORACIC TEN KYPHOPLASTY;  Surgeon: Phylliss Bob, MD;  Location: [facility];  Service: Orthopedics;  Laterality: N/A;   SKIN BIOPSY  01/2007   basal cell carcinoma removed from right lower leg    TOTAL HIP ARTHROPLASTY  06/2007   left   TOTAL HIP ARTHROPLASTY Right 08/26/2016   Procedure: TOTAL HIP ARTHROPLASTY ANTERIOR APPROACH;  Surgeon: Melrose Nakayama, MD;  Location: [facility];  Service: Orthopedics;  Laterality: Right;   TOTAL KNEE ARTHROPLASTY Right 10/2007   right    SOCIAL HISTORY: Social History   Socioeconomic History   Marital status: Married    Spouse name: Not on file   Number of children: Not on file   Years of education: Not on file   Highest education level: Not on file  Occupational History   Occupation: retired    Fish farm manager: RETIRED  Tobacco Use   Smoking status: Never   Smokeless tobacco: Never  Vaping Use   Vaping Use: Never used  Substance and Sexual Activity   Alcohol use: Not Currently    Comment: BEER/glass of wine A  DAY   Drug use: No   Sexual activity: Never  Other Topics Concern   Not on file  Social History Narrative   Not on file   Social Determinants of Health   Financial Resource Strain: Not on file  Food Insecurity: Not on file  Transportation Needs: Not on file  Physical Activity: Not on file  Stress: Not on file  Social Connections: Not on file  Intimate Partner Violence: Not on file    FAMILY HISTORY: Family History  Problem Relation Age of Onset   Diabetes Mother    Heart failure Mother    Hypertension Father    Diabetes Father    Heart disease Father    Heart attack Father     ALLERGIES:  has No Known Allergies.  MEDICATIONS:  Current Outpatient Medications  Medication Sig Dispense Refill  acyclovir (ZOVIRAX) 400 MG tablet TAKE ONE TABLET BY MOUTH TWICE DAILY 60 tablet 2   amLODipine (NORVASC) 10 MG tablet Take 10 mg by mouth daily.     Calcium Citrate-Vitamin D (CALCIUM + D PO) Take 1 tablet by mouth daily.     dicyclomine (BENTYL) 10 MG capsule Take 1 capsule (10 mg total) by mouth 3 (three) times daily as needed for spasms. 90 capsule 0   gemfibrozil (LOPID) 600 MG tablet Take 600 mg by mouth 2 (two) times daily before a meal.     HYDROcodone-acetaminophen (NORCO/VICODIN) 5-325 MG tablet TAKE ONE TABLET EVERY 6 HOURS AS NEEDED FOR MODERATE PAIN 30 tablet 0   levothyroxine (SYNTHROID, LEVOTHROID) 25 MCG tablet Take 25 mcg by mouth daily.      LORazepam (ATIVAN) 0.5 MG tablet Take 0.5 mg by mouth daily as needed for anxiety or sleep.     losartan (COZAAR) 50 MG tablet Take 50 mg by mouth daily.     Multiple Vitamin (MULTIVITAMIN) capsule Take 1 capsule by mouth daily.     ondansetron (ZOFRAN) 8 MG tablet Take 1 tablet (8 mg total) by mouth every 8 (eight) hours as needed. 30 tablet 0   pantoprazole (PROTONIX) 20 MG tablet Take 20 mg by mouth daily.     polyethylene glycol powder (GLYCOLAX/MIRALAX) 17 GM/SCOOP powder Take 17 g by mouth at bedtime.     pregabalin  (LYRICA) 25 MG capsule Take 1 capsule (25 mg total) by mouth 2 (two) times daily. 60 capsule 1   pregabalin (LYRICA) 25 MG capsule Take 1 capsule (25 mg total) by mouth 2 (two) times daily. 60 capsule 1   No current facility-administered medications for this visit.    REVIEW OF SYSTEMS:   Constitutional: ( - ) fevers, ( - )  chills , ( - ) night sweats Eyes: ( - ) blurriness of vision, ( - ) double vision, ( - ) watery eyes Ears, nose, mouth, throat, and face: ( - ) mucositis, ( - ) sore throat Respiratory: ( - ) cough, ( - ) dyspnea, ( - ) wheezes Cardiovascular: ( - ) palpitation, ( - ) chest discomfort, ( - ) lower extremity swelling Gastrointestinal:  ( - ) nausea, ( - ) heartburn, ( - ) change in bowel habits Skin: ( - ) abnormal skin rashes Lymphatics: ( - ) new lymphadenopathy, ( - ) easy bruising Neurological: (+ ) numbness, ( +) tingling, ( - ) new weaknesses Behavioral/Psych: ( - ) mood change, ( - ) new changes  All other systems were reviewed with the patient and are negative.  PHYSICAL EXAMINATION: ECOG PERFORMANCE STATUS: 2 - Symptomatic, <50% confined to bed  Vitals:   04/25/22 1416  BP: (!) 146/89  Pulse: 79  Resp: 16  Temp: 98.2 F (36.8 C)  SpO2: 100%    Filed Weights   04/25/22 1416  Weight: 110 lb 12.8 oz (50.3 kg)     GENERAL: Well-appearing elderly Caucasian female, alert, no distress and comfortable SKIN: skin color, texture, turgor are normal, no rashes or significant lesions EYES: conjunctiva are pink and non-injected, sclera clear LUNGS: clear to auscultation and percussion with normal breathing effort HEART: regular rate & rhythm and no murmurs and no lower extremity edema Musculoskeletal: no cyanosis of digits and no clubbing  PSYCH: alert & oriented x 3, fluent speech NEURO: no focal motor/sensory deficits  LABORATORY DATA:  I have reviewed the data as listed    Latest Ref Rng &  Units 05/02/2022    2:13 PM 04/25/2022    2:02 PM 04/18/2022     2:14 PM  CBC  WBC 4.0 - 10.5 K/uL 5.2  7.3  3.8   Hemoglobin 12.0 - 15.0 g/dL 9.9  10.3  9.9   Hematocrit 36.0 - 46.0 % 28.5  29.7  28.4   Platelets 150 - 400 K/uL 262  264  236        Latest Ref Rng & Units 05/02/2022    2:13 PM 04/25/2022    2:02 PM 04/18/2022    2:14 PM  CMP  Glucose 70 - 99 mg/dL 106  117  146   BUN 8 - 23 mg/dL _0 Creatinine 0.44 - 1.00 mg/dL 0.69  0.73  0.81   Sodium 135 - 145 mmol/L 131  136  133   Potassium 3.5 - 5.1 mmol/L 3.9  4.2  4.1   Chloride 98 - 111 mmol/L 103  107  106   CO2 22 - 32 mmol/L _1 Calcium 8.9 - 10.3 mg/dL 9.0  9.2  9.3   Total Protein 6.5 - 8.1 g/dL 7.1  7.2  6.8   Total Bilirubin 0.3 - 1.2 mg/dL 0.3  0.5  0.3   Alkaline Phos 38 - 126 U/L 89  91  83   AST 15 - 41 U/L _2 ALT 0 - 44 U/L _3 Lab Results  Component Value Date   MPROTEIN 0.1 (H) 04/11/2022   MPROTEIN 0.5 (H) 03/14/2022   MPROTEIN 0.5 (H) 02/14/2022   Lab Results  Component Value Date   KPAFRELGTCHN 7.5 04/11/2022   KPAFRELGTCHN 13.7 03/14/2022   KPAFRELGTCHN 15.4 02/14/2022   LAMBDASER 4.9 (L) 04/11/2022   LAMBDASER 9.8 03/14/2022   LAMBDASER 11.3 02/14/2022   KAPLAMBRATIO 1.53 04/11/2022   KAPLAMBRATIO 1.40 03/14/2022   KAPLAMBRATIO 1.36 02/14/2022   RADIOGRAPHIC STUDIES: No results found.  ASSESSMENT & PLAN [name] Page 88 y.o. female with medical history significant for newly diagnosed IgG lambda multiple myeloma who presents for a follow up visit.  #IgG Lambda Multiple Myeloma -- Diagnosis confirmed by bone marrow biopsy on 09/27/2021 which shows evidence of 20% involvement by plasma cells.  Additionally has anemia, macrocytosis, leukopenia, and concern for lytic lesions on bone imaging --Prognostic panel shows a deletion 17 with a TP53 mutation --Started Cycle 1 Day 1 of Velcade/Dex on 10/18/2021 --Switched to Dara/Dex on 03/14/2022 due to neuropathy secondary to Velcade.  Plan: --Due for Cycle 2 Day 15 of  Dara/Dex today  --Labs from 04/11/2022 showed M protein 0.1 with a normal kappa lambda ratio --Labs today show white blood cell count 5.2, hemoglobin 9.9, MCV 92.2, and platelets of 262, Cr 0.69 --Continue with weekly labs and Dara/Dex treatments and RTC in 2 weeks for a follow up visit.   # H/O Confusion --decreased dexamethasone to 20 mg on infusion days.  --Symptoms resolved.   #Leukopenia/Macrocytic anemia/Vitamin B12 deficiency: --Secondary to chemotherapy and multiple myeloma --Today, hemoglobin 9.9 with MCV of 92.2 --Received vitamin B12 injection 1000 mcg weekly x 4 doses. Will continue now with monthly injections.   #Right rib pain, improved: --CT chest from 10/25/2021 that didn't show any new or progressive disease.   #Abdominal cramping--resolved: --Symptoms occur after velcade injection.  --Has Bentyl as needed  #Neuropathy: --Grade 2 involving finger tips and feet, worsening --Secondary to velcade  --  Currently on Lyrica 25 mg daily. Consider increasing to 50 mg if 25 mg dose provides minimal relief.   #Supportive Care -- chemotherapy education complete -- port placement not required.  -- zofran 23m q8H PRN and compazine 121mPO q6H for nausea -- acyclovir 40046mO BID for VCZ prophylaxis. Patient was not taking medication as prescribed. Advised importance of taking medication consistently.  -- We will start Zometa as soon as is feasible after dental clearance  No orders of the defined types were placed in this encounter.  All questions were answered. The patient knows to call the clinic with any problems, questions or concerns.  I have spent a total of 30 minutes minutes of face-to-face and non-face-to-face time, preparing to see the patient, performing a medically appropriate examination, counseling and educating the patient, ordering medications/tests, documenting clinical information in the electronic health record, and care coordination.   JohLedell PeoplesMD Department of Hematology/Oncology [facility] Centerone: [phone]: [phone]: [email or web address]    05/07/2022 12:23 PM

## 2022-04-25 NOTE — Patient Instructions (Signed)
[facility] MEDICAL [facility]COLOGY  Discharge Instructions: Thank you for choosing [facility] to provide your oncology and hematology care.   If you have a lab appointment with the Cancer Center, please go directly to the Cancer Center and check in at the registration area.   Wear comfortable clothing and clothing appropriate for easy access to any Portacath or PICC line.   We strive to give you quality time with your provider. You may need to reschedule your appointment if you arrive late (15 or more minutes).  Arriving late affects you and other patients whose appointments are after yours.  Also, if you miss three or more appointments without notifying the office, you may be dismissed from the clinic at the provider's discretion.      For prescription refill requests, have your pharmacy contact our office and allow 72 hours for refills to be completed.    Today you received the following chemotherapy and/or immunotherapy agents: Darzalex Faspro      To help prevent nausea and vomiting after your treatment, we encourage you to take your nausea medication as directed.  BELOW ARE SYMPTOMS THAT SHOULD BE REPORTED IMMEDIATELY: *FEVER GREATER THAN 100.4 F (38 C) OR HIGHER *CHILLS OR SWEATING *NAUSEA AND VOMITING THAT IS NOT CONTROLLED WITH YOUR NAUSEA MEDICATION *UNUSUAL SHORTNESS OF BREATH *UNUSUAL BRUISING OR BLEEDING *URINARY PROBLEMS (pain or burning when urinating, or frequent urination) *BOWEL PROBLEMS (unusual diarrhea, constipation, pain near the anus) TENDERNESS IN MOUTH AND THROAT WITH OR WITHOUT PRESENCE OF ULCERS (sore throat, sores in mouth, or a toothache) UNUSUAL RASH, SWELLING OR PAIN  UNUSUAL VAGINAL DISCHARGE OR ITCHING   Items with * indicate a potential emergency and should be followed up as soon as possible or go to the Emergency Department if any problems should occur.  Please show the CHEMOTHERAPY ALERT CARD or IMMUNOTHERAPY ALERT CARD at  check-in to the Emergency Department and triage nurse.  Should you have questions after your visit or need to cancel or reschedule your appointment, please contact [facility]COL[facility]  Dept: [phone]  and follow the prompts.  Office hours are 8:00 a.m. to 4:30 p.m. Monday - Friday. Please note that voicemails left after 4:00 p.m. may not be returned until the following business day.  We are closed weekends and major holidays. You have access to a nurse at all times for urgent questions. Please call the main number to the clinic Dept: [phone] and follow the prompts.   For any non-urgent questions, you may also contact your provider using MyChart. We now offer e-Visits for anyone 18 and older to request care online for non-urgent symptoms. For details visit [email or web address].   Also download the MyChart app! Go to the app store, search "MyChart", open the app, select , and log in with your MyChart username and password.  Masks are optional in the cancer centers. If you would like for your care team to wear a mask while they are taking care of you, please let them know. You may have one support person who is at least 86 years old accompany you for your appointments. 

## 2022-05-02 ENCOUNTER — Inpatient Hospital Stay: Payer: Medicare PPO

## 2022-05-02 ENCOUNTER — Ambulatory Visit: Payer: Medicare PPO

## 2022-05-02 ENCOUNTER — Other Ambulatory Visit: Payer: Medicare PPO

## 2022-05-02 ENCOUNTER — Other Ambulatory Visit: Payer: Self-pay

## 2022-05-02 VITALS — BP 145/60 | HR 69 | Temp 97.7°F | Resp 18

## 2022-05-02 DIAGNOSIS — C9000 Multiple myeloma not having achieved remission: Secondary | ICD-10-CM

## 2022-05-02 DIAGNOSIS — Z5112 Encounter for antineoplastic immunotherapy: Secondary | ICD-10-CM | POA: Diagnosis not present

## 2022-05-02 LAB — CBC WITH DIFFERENTIAL (CANCER CENTER ONLY)
Abs Immature Granulocytes: 0.02 10*3/uL (ref 0.00–0.07)
Basophils Absolute: 0 10*3/uL (ref 0.0–0.1)
Basophils Relative: 1 %
Eosinophils Absolute: 0.1 10*3/uL (ref 0.0–0.5)
Eosinophils Relative: 1 %
HCT: 28.5 % — ABNORMAL LOW (ref 36.0–46.0)
Hemoglobin: 9.9 g/dL — ABNORMAL LOW (ref 12.0–15.0)
Immature Granulocytes: 0 %
Lymphocytes Relative: 10 %
Lymphs Abs: 0.5 10*3/uL — ABNORMAL LOW (ref 0.7–4.0)
MCH: 32 pg (ref 26.0–34.0)
MCHC: 34.7 g/dL (ref 30.0–36.0)
MCV: 92.2 fL (ref 80.0–100.0)
Monocytes Absolute: 0.6 10*3/uL (ref 0.1–1.0)
Monocytes Relative: 11 %
Neutro Abs: 4 10*3/uL (ref 1.7–7.7)
Neutrophils Relative %: 77 %
Platelet Count: 262 10*3/uL (ref 150–400)
RBC: 3.09 MIL/uL — ABNORMAL LOW (ref 3.87–5.11)
RDW: 13.2 % (ref 11.5–15.5)
WBC Count: 5.2 10*3/uL (ref 4.0–10.5)
nRBC: 0 % (ref 0.0–0.2)

## 2022-05-02 LAB — CMP (CANCER CENTER ONLY)
ALT: 7 U/L (ref 0–44)
AST: 14 U/L — ABNORMAL LOW (ref 15–41)
Albumin: 4.1 g/dL (ref 3.5–5.0)
Alkaline Phosphatase: 89 U/L (ref 38–126)
Anion gap: 7 (ref 5–15)
BUN: 17 mg/dL (ref 8–23)
CO2: 21 mmol/L — ABNORMAL LOW (ref 22–32)
Calcium: 9 mg/dL (ref 8.9–10.3)
Chloride: 103 mmol/L (ref 98–111)
Creatinine: 0.69 mg/dL (ref 0.44–1.00)
GFR, Estimated: >60 mL/min (ref >60)
Glucose, Bld: 106 mg/dL — ABNORMAL HIGH (ref 70–99)
Potassium: 3.9 mmol/L (ref 3.5–5.1)
Sodium: 131 mmol/L — ABNORMAL LOW (ref 135–145)
Total Bilirubin: 0.3 mg/dL (ref 0.3–1.2)
Total Protein: 7.1 g/dL (ref 6.5–8.1)

## 2022-05-02 LAB — LACTATE DEHYDROGENASE: LDH: 133 U/L (ref 98–192)

## 2022-05-02 MED ORDER — DARATUMUMAB-HYALURONIDASE-FIHJ 1800-30000 MG-UT/15ML ~~LOC~~ SOLN
1800.0000 mg | Freq: Once | SUBCUTANEOUS | Status: AC
  Administered 2022-05-02: 1800 mg via SUBCUTANEOUS
  Filled 2022-05-02: qty 15

## 2022-05-02 MED ORDER — DEXAMETHASONE 4 MG PO TABS
20.0000 mg | ORAL_TABLET | Freq: Once | ORAL | Status: AC
  Administered 2022-05-02: 20 mg via ORAL
  Filled 2022-05-02: qty 5

## 2022-05-02 MED ORDER — ACETAMINOPHEN 325 MG PO TABS
650.0000 mg | ORAL_TABLET | Freq: Once | ORAL | Status: AC
  Administered 2022-05-02: 650 mg via ORAL
  Filled 2022-05-02: qty 2

## 2022-05-02 MED ORDER — DIPHENHYDRAMINE HCL 25 MG PO CAPS
50.0000 mg | ORAL_CAPSULE | Freq: Once | ORAL | Status: AC
  Administered 2022-05-02: 50 mg via ORAL
  Filled 2022-05-02: qty 2

## 2022-05-02 NOTE — Patient Instructions (Signed)
[facility]ICAL [facility]COLOG[facility]  Discharge Instructions: Thank you for choosing [facility] to provide your oncology and hematology care.   If you have a lab appointment with the Cancer Center, please go directly to the Cancer Center and check in at the registration area.   Wear comfortable clothing and clothing appropriate for easy access to any Portacath or PICC line.   We strive to give you quality time with your provider. You may need to reschedule your appointment if you arrive late (15 or more minutes).  Arriving late affects you and other patients whose appointments are after yours.  Also, if you miss three or more appointments without notifying the office, you may be dismissed from the clinic at the provider's discretion.      For prescription refill requests, have your pharmacy contact our office and allow 72 hours for refills to be completed.    Today you received the following chemotherapy and/or immunotherapy agents: daratumumab- hyaluronidase      To help prevent nausea and vomiting after your treatment, we encourage you to take your nausea medication as directed.  BELOW ARE SYMPTOMS THAT SHOULD BE REPORTED IMMEDIATELY: *FEVER GREATER THAN 100.4 F (38 C) OR HIGHER *CHILLS OR SWEATING *NAUSEA AND VOMITING THAT IS NOT CONTROLLED WITH YOUR NAUSEA MEDICATION *UNUSUAL SHORTNESS OF BREATH *UNUSUAL BRUISING OR BLEEDING *URINARY PROBLEMS (pain or burning when urinating, or frequent urination) *BOWEL PROBLEMS (unusual diarrhea, constipation, pain near the anus) TENDERNESS IN MOUTH AND THROAT WITH OR WITHOUT PRESENCE OF ULCERS (sore throat, sores in mouth, or a toothache) UNUSUAL RASH, SWELLING OR PAIN  UNUSUAL VAGINAL DISCHARGE OR ITCHING   Items with * indicate a potential emergency and should be followed up as soon as possible or go to the Emergency Department if any problems should occur.  Please show the CHEMOTHERAPY ALERT CARD or IMMUNOTHERAPY ALERT  CARD at check-in to the Emergency Department and triage nurse.  Should you have questions after your visit or need to cancel or reschedule your appointment, please contact [facility]  Dept: [phone]  and follow the prompts.  Office hours are 8:00 a.m. to 4:30 p.m. Monday - Friday. Please note that voicemails left after 4:00 p.m. may not be returned until the following business day.  We are closed weekends and major holidays. You have access to a nurse at all times for urgent questions. Please call the main number to the clinic Dept: [phone] and follow the prompts.   For any non-urgent questions, you may also contact your provider using MyChart. We now offer e-Visits for anyone 18 and older to request care online for non-urgent symptoms. For details visit [email or web address].   Also download the MyChart app! Go to the app store, search "MyChart", open the app, select [facility], and log in with your MyChart username and password.  Masks are optional in the cancer centers. If you would like for your care team to wear a mask while they are taking care of you, please let them know. You may have one support person who is at least 86 years old accompany you for your appointments. 

## 2022-05-07 ENCOUNTER — Encounter: Payer: Self-pay | Admitting: Hematology and Oncology

## 2022-05-09 ENCOUNTER — Other Ambulatory Visit: Payer: Self-pay

## 2022-05-09 ENCOUNTER — Inpatient Hospital Stay: Payer: Medicare PPO | Admitting: Physician Assistant

## 2022-05-09 ENCOUNTER — Inpatient Hospital Stay: Payer: Medicare PPO

## 2022-05-09 VITALS — BP 124/61 | HR 76 | Temp 98.1°F | Resp 17 | Ht 61.0 in | Wt 112.4 lb

## 2022-05-09 DIAGNOSIS — C9000 Multiple myeloma not having achieved remission: Secondary | ICD-10-CM

## 2022-05-09 DIAGNOSIS — Z5112 Encounter for antineoplastic immunotherapy: Secondary | ICD-10-CM | POA: Diagnosis not present

## 2022-05-09 LAB — CBC WITH DIFFERENTIAL (CANCER CENTER ONLY)
Abs Immature Granulocytes: 0.03 10*3/uL (ref 0.00–0.07)
Basophils Absolute: 0 10*3/uL (ref 0.0–0.1)
Basophils Relative: 0 %
Eosinophils Absolute: 0.1 10*3/uL (ref 0.0–0.5)
Eosinophils Relative: 1 %
HCT: 27.8 % — ABNORMAL LOW (ref 36.0–46.0)
Hemoglobin: 9.8 g/dL — ABNORMAL LOW (ref 12.0–15.0)
Immature Granulocytes: 1 %
Lymphocytes Relative: 9 %
Lymphs Abs: 0.4 10*3/uL — ABNORMAL LOW (ref 0.7–4.0)
MCH: 32.2 pg (ref 26.0–34.0)
MCHC: 35.3 g/dL (ref 30.0–36.0)
MCV: 91.4 fL (ref 80.0–100.0)
Monocytes Absolute: 0.5 10*3/uL (ref 0.1–1.0)
Monocytes Relative: 9 %
Neutro Abs: 4.2 10*3/uL (ref 1.7–7.7)
Neutrophils Relative %: 80 %
Platelet Count: 271 10*3/uL (ref 150–400)
RBC: 3.04 MIL/uL — ABNORMAL LOW (ref 3.87–5.11)
RDW: 13.1 % (ref 11.5–15.5)
WBC Count: 5.2 10*3/uL (ref 4.0–10.5)
nRBC: 0 % (ref 0.0–0.2)

## 2022-05-09 LAB — CMP (CANCER CENTER ONLY)
ALT: 7 U/L (ref 0–44)
AST: 16 U/L (ref 15–41)
Albumin: 3.9 g/dL (ref 3.5–5.0)
Alkaline Phosphatase: 101 U/L (ref 38–126)
Anion gap: 7 (ref 5–15)
BUN: 13 mg/dL (ref 8–23)
CO2: 20 mmol/L — ABNORMAL LOW (ref 22–32)
Calcium: 8.7 mg/dL — ABNORMAL LOW (ref 8.9–10.3)
Chloride: 98 mmol/L (ref 98–111)
Creatinine: 0.74 mg/dL (ref 0.44–1.00)
GFR, Estimated: >60 mL/min (ref >60)
Glucose, Bld: 111 mg/dL — ABNORMAL HIGH (ref 70–99)
Potassium: 3.9 mmol/L (ref 3.5–5.1)
Sodium: 125 mmol/L — ABNORMAL LOW (ref 135–145)
Total Bilirubin: 0.3 mg/dL (ref 0.3–1.2)
Total Protein: 7.1 g/dL (ref 6.5–8.1)

## 2022-05-09 LAB — LACTATE DEHYDROGENASE: LDH: 125 U/L (ref 98–192)

## 2022-05-09 MED ORDER — DIPHENHYDRAMINE HCL 25 MG PO CAPS
50.0000 mg | ORAL_CAPSULE | Freq: Once | ORAL | Status: AC
  Administered 2022-05-09: 50 mg via ORAL
  Filled 2022-05-09: qty 2

## 2022-05-09 MED ORDER — DARATUMUMAB-HYALURONIDASE-FIHJ 1800-30000 MG-UT/15ML ~~LOC~~ SOLN
1800.0000 mg | Freq: Once | SUBCUTANEOUS | Status: AC
  Administered 2022-05-09: 1800 mg via SUBCUTANEOUS
  Filled 2022-05-09: qty 15

## 2022-05-09 MED ORDER — ACETAMINOPHEN 325 MG PO TABS
650.0000 mg | ORAL_TABLET | Freq: Once | ORAL | Status: AC
  Administered 2022-05-09: 650 mg via ORAL
  Filled 2022-05-09: qty 2

## 2022-05-09 MED ORDER — HYDROCODONE-ACETAMINOPHEN 5-325 MG PO TABS: ORAL_TABLET | ORAL | 0 refills | Status: DC

## 2022-05-09 MED ORDER — DEXAMETHASONE 4 MG PO TABS
20.0000 mg | ORAL_TABLET | Freq: Once | ORAL | Status: AC
  Administered 2022-05-09: 20 mg via ORAL
  Filled 2022-05-09: qty 5

## 2022-05-09 NOTE — Patient Instructions (Signed)
[facility]ICAL [facility]COLOG[facility]  Discharge Instructions: Thank you for choosing [facility] to provide your oncology and hematology care.   If you have a lab appointment with the Cancer Center, please go directly to the Cancer Center and check in at the registration area.   Wear comfortable clothing and clothing appropriate for easy access to any Portacath or PICC line.   We strive to give you quality time with your provider. You may need to reschedule your appointment if you arrive late (15 or more minutes).  Arriving late affects you and other patients whose appointments are after yours.  Also, if you miss three or more appointments without notifying the office, you may be dismissed from the clinic at the provider's discretion.      For prescription refill requests, have your pharmacy contact our office and allow 72 hours for refills to be completed.    Today you received the following chemotherapy and/or immunotherapy agents: daratumumab- hyaluronidase      To help prevent nausea and vomiting after your treatment, we encourage you to take your nausea medication as directed.  BELOW ARE SYMPTOMS THAT SHOULD BE REPORTED IMMEDIATELY: *FEVER GREATER THAN 100.4 F (38 C) OR HIGHER *CHILLS OR SWEATING *NAUSEA AND VOMITING THAT IS NOT CONTROLLED WITH YOUR NAUSEA MEDICATION *UNUSUAL SHORTNESS OF BREATH *UNUSUAL BRUISING OR BLEEDING *URINARY PROBLEMS (pain or burning when urinating, or frequent urination) *BOWEL PROBLEMS (unusual diarrhea, constipation, pain near the anus) TENDERNESS IN MOUTH AND THROAT WITH OR WITHOUT PRESENCE OF ULCERS (sore throat, sores in mouth, or a toothache) UNUSUAL RASH, SWELLING OR PAIN  UNUSUAL VAGINAL DISCHARGE OR ITCHING   Items with * indicate a potential emergency and should be followed up as soon as possible or go to the Emergency Department if any problems should occur.  Please show the CHEMOTHERAPY ALERT CARD or IMMUNOTHERAPY ALERT  CARD at check-in to the Emergency Department and triage nurse.  Should you have questions after your visit or need to cancel or reschedule your appointment, please contact [facility]COL[facility]  Dept: [phone]  and follow the prompts.  Office hours are 8:00 a.m. to 4:30 p.m. Monday - Friday. Please note that voicemails left after 4:00 p.m. may not be returned until the following business day.  We are closed weekends and major holidays. You have access to a nurse at all times for urgent questions. Please call the main number to the clinic Dept: [phone] and follow the prompts.   For any non-urgent questions, you may also contact your provider using MyChart. We now offer e-Visits for anyone 18 and older to request care online for non-urgent symptoms. For details visit [email or web address].   Also download the MyChart app! Go to the app store, search "MyChart", open the app, select [facility], and log in with your MyChart username and password.  Masks are optional in the cancer centers. If you would like for your care team to wear a mask while they are taking care of you, please let them know. You may have one support Alphonso Gregson who is at least 86 years old accompany you for your appointments. 

## 2022-05-12 ENCOUNTER — Telehealth: Payer: Self-pay | Admitting: Physician Assistant

## 2022-05-12 ENCOUNTER — Encounter: Payer: Self-pay | Admitting: Hematology and Oncology

## 2022-05-12 LAB — KAPPA/LAMBDA LIGHT CHAINS
Kappa free light chain: 9.9 mg/L (ref 3.3–19.4)
Kappa, lambda light chain ratio: 1.25 (ref 0.26–1.65)
Lambda free light chains: 7.9 mg/L (ref 5.7–26.3)

## 2022-05-12 NOTE — Progress Notes (Signed)
[place] [phone]   [phone]  PROGRESS NOTE  Patient Care Team: Burnard Bunting, MD as PCP - General (Internal Medicine)  Hematological/Oncological History # IgG Lambda Multiple Myeloma 06/04/2021: WBC 5.5, Hgb 11.1, MCV 101.3, Plt 306 07/24/2021: M protein 2.3, IFE shows IgG Lambda monoclonal specificity. Cr 0.9 08/07/2021: Establish care with Dr. Lorenso Courier 09/27/2021: Bmbx showed hypercellular bone marrow with plasma cell neoplasm, increased number of atypical plasma cells averaging 20% of all cells in the aspirate 10/18/2021: Cycle 1 of Velcade/Dexamethasone 11/08/2021: Cycle 2 of Velcade/Dexamethasone 11/29/2021: Cycle 3 of Velcade/Dexamethasone 12/20/2021: Cycle 4 of Velcade/Dexamethasone 01/10/2022: Cycle 5 of Velcade/Dexamethasone 01/31/2022: Cycle 6 of Velcade/Dexamethasone 02/28/2022: Cycle 7 of Velcade/Dexamethasone. Dose reduced velcade due to neuropathy  03/14/2022: Cycle 1 Day 1 of Dara/Dex (velcade d/c due to neuropathy).  04/11/2022: Cycle 2 Day 1 of Dara/Dex 05/10/1011: Cycle 3 Day 1 of Dara/Dex  Interval History:  Michell Heinrich Mariotti 88 y.o. female with medical history significant for  IgG lambda multiple myeloma who presents for a follow up visit. The patient's last visit was on 04/25/2022. In the interim, patient has had no major changes in her health. She is due for Cycle 3, Day 1.  On exam today Mrs. Royer reports overall stable and tolerating treatment well. Her energy levels are about the same/ she has a good appetite but eats small, frequent meals. She denies nausea, vomiting or abdominal pain. Her bowel habits are unchanged. She was recently diagnosed with a vaginal infection. Her neuropathy is stable involving her hand and feet. She is compliant with taking Lyrica as prescribed.   Patient denies any fevers, chills, night sweats, shortness of breath, chest pain or cough.She has no other complaints.  Rest of the 10 point ROS is  below.  MEDICAL HISTORY:  Past Medical History:  Diagnosis Date   Alopecia 10/28/2016   Anginal pain (HCC)    Arthritis    Cervical spondylosis    C3-4 AND C4-5   Chest pain 07/28/2008   H/O, normal stress nuclear EF 78%   Chronic cough 07/12/2014   Collapsed lung 07/2010   being treated at [facility] - in left lung   Complication of anesthesia    Cough 07/30/2010   Qualifier: Diagnosis of  By: Chase Caller MD, Murali     Cystocele 05/23/2015   Cystocele, midline 05/02/2013   DEEP VEIN THROMBOSIS/PHLEBITIS 07/30/2010   Qualifier: History of  By: Harvest Dark CMA, Jennifer     Dislocation closed, shoulder 02/2013   DVT (deep venous thrombosis) ([place]) 2006   Encounter for therapeutic drug monitoring 10/03/2014   GERD (gastroesophageal reflux disease)    occ   Headache    hx   High cholesterol    History of recurrent UTIs 09/24/2011   HYPERLIPIDEMIA 07/30/2010   Qualifier: Diagnosis of  By: Harvest Dark CMA, Jennifer     Hypertension    HYPERTENSION 07/30/2010   Qualifier: Diagnosis of  By: Harvest Dark CMA, Jennifer     Hypothyroidism    Nasal itching 01/30/2014   Osteopenia 05/23/2015   Peripheral vascular disease ([place]) 2006   dvt's and 50 yrs ago   Pleuritic pain 12/17/2015   PONV (postoperative nausea and vomiting)    Primary localized osteoarthritis of right hip 08/26/2016   Primary osteoarthritis of right hip 08/26/2016   Pulmonary collapse 07/30/2010   Qualifier: Diagnosis of  By: Chase Caller MD, Murali     PULMONARY NODULE 07/30/2010   Qualifier: Diagnosis of  By: Chase Caller MD, Belva Crome  Rash and nonspecific skin eruption 01/02/2015   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   Sarcoidosis    Sarcoidosis of lung ([place])    Vaginal atrophy 09/24/2011   Visual field defect nasal step 01/02/2015    SURGICAL HISTORY: Past Surgical History:  Procedure Laterality Date   BACK SURGERY  2022   EYE SURGERY     FOOT SURGERY Left 06/2005   bunion hammer toe   HEMORRHOID SURGERY  12/1972    INTRAOCULAR LENS INSERTION     right eye 10 /16 and left 06/16/2005   JOINT REPLACEMENT     KYPHOPLASTY N/A 06/27/2021   Procedure: LUMBAR 3 AND LUMBAR  5 KYPHOPLASTY;  Surgeon: Phylliss Bob, MD;  Location: [place];  Service: Orthopedics;  Laterality: N/A;   KYPHOPLASTY N/A 09/04/2021   Procedure: THORACIC SEVEN, THORACIC EIGHT, THORACIC NINE, THORACIC TEN KYPHOPLASTY;  Surgeon: Phylliss Bob, MD;  Location: [place];  Service: Orthopedics;  Laterality: N/A;   SKIN BIOPSY  01/2007   basal cell carcinoma removed from right lower leg    TOTAL HIP ARTHROPLASTY  06/2007   left   TOTAL HIP ARTHROPLASTY Right 08/26/2016   Procedure: TOTAL HIP ARTHROPLASTY ANTERIOR APPROACH;  Surgeon: Melrose Nakayama, MD;  Location: [place];  Service: Orthopedics;  Laterality: Right;   TOTAL KNEE ARTHROPLASTY Right 10/2007   right    SOCIAL HISTORY: Social History   Socioeconomic History   Marital status: Married    Spouse name: Not on file   Number of children: Not on file   Years of education: Not on file   Highest education level: Not on file  Occupational History   Occupation: retired    Fish farm manager: RETIRED  Tobacco Use   Smoking status: Never   Smokeless tobacco: Never  Vaping Use   Vaping Use: Never used  Substance and Sexual Activity   Alcohol use: Not Currently    Comment: BEER/glass of wine A DAY   Drug use: No   Sexual activity: Never  Other Topics Concern   Not on file  Social History Narrative   Not on file   Social Determinants of Health   Financial Resource Strain: Not on file  Food Insecurity: Not on file  Transportation Needs: Not on file  Physical Activity: Not on file  Stress: Not on file  Social Connections: Not on file  Intimate Partner Violence: Not on file    FAMILY HISTORY: Family History  Problem Relation Age of Onset   Diabetes Mother    Heart failure Mother    Hypertension Father    Diabetes Father    Heart disease Father    Heart attack Father      ALLERGIES:  has No Known Allergies.  MEDICATIONS:  Current Outpatient Medications  Medication Sig Dispense Refill   acyclovir (ZOVIRAX) 400 MG tablet TAKE ONE TABLET BY MOUTH TWICE DAILY 60 tablet 2   amLODipine (NORVASC) 10 MG tablet Take 10 mg by mouth daily.     Calcium Citrate-Vitamin D (CALCIUM + D PO) Take 1 tablet by mouth daily.     ciprofloxacin (CIPRO) 500 MG tablet SMARTSIG:1 Tablet(s) By Mouth Every 12 Hours     dicyclomine (BENTYL) 10 MG capsule Take 1 capsule (10 mg total) by mouth 3 (three) times daily as needed for spasms. 90 capsule 0   fluconazole (DIFLUCAN) 150 MG tablet Take by mouth.     gemfibrozil (LOPID) 600 MG tablet Take 600 mg by mouth 2 (two) times daily before a meal.  levothyroxine (SYNTHROID, LEVOTHROID) 25 MCG tablet Take 25 mcg by mouth daily.      LORazepam (ATIVAN) 0.5 MG tablet Take 0.5 mg by mouth daily as needed for anxiety or sleep.     losartan (COZAAR) 50 MG tablet Take 50 mg by mouth daily.     Multiple Vitamin (MULTIVITAMIN) capsule Take 1 capsule by mouth daily.     ondansetron (ZOFRAN) 8 MG tablet Take 1 tablet (8 mg total) by mouth every 8 (eight) hours as needed. 30 tablet 0   pantoprazole (PROTONIX) 20 MG tablet Take 20 mg by mouth daily.     polyethylene glycol powder (GLYCOLAX/MIRALAX) 17 GM/SCOOP powder Take 17 g by mouth at bedtime.     pregabalin (LYRICA) 25 MG capsule Take 1 capsule (25 mg total) by mouth 2 (two) times daily. 60 capsule 1   pregabalin (LYRICA) 25 MG capsule Take 1 capsule (25 mg total) by mouth 2 (two) times daily. 60 capsule 1   HYDROcodone-acetaminophen (NORCO/VICODIN) 5-325 MG tablet TAKE ONE TABLET EVERY 6 HOURS AS NEEDED FOR MODERATE PAIN 30 tablet 0   No current facility-administered medications for this visit.    REVIEW OF SYSTEMS:   Constitutional: ( - ) fevers, ( - )  chills , ( - ) night sweats Eyes: ( - ) blurriness of vision, ( - ) double vision, ( - ) watery eyes Ears, nose, mouth, throat, and  face: ( - ) mucositis, ( - ) sore throat Respiratory: ( - ) cough, ( - ) dyspnea, ( - ) wheezes Cardiovascular: ( - ) palpitation, ( - ) chest discomfort, ( - ) lower extremity swelling Gastrointestinal:  ( - ) nausea, ( - ) heartburn, ( - ) change in bowel habits Skin: ( - ) abnormal skin rashes Lymphatics: ( - ) new lymphadenopathy, ( - ) easy bruising Neurological: (+ ) numbness, ( +) tingling, ( - ) new weaknesses Behavioral/Psych: ( - ) mood change, ( - ) new changes  All other systems were reviewed with the patient and are negative.  PHYSICAL EXAMINATION: ECOG PERFORMANCE STATUS: 2 - Symptomatic, <50% confined to bed  Vitals:   05/09/22 1322  BP: 124/61  Pulse: 76  Resp: 17  Temp: 98.1 F (36.7 C)  SpO2: 98%    Filed Weights   05/09/22 1322  Weight: 112 lb 6.4 oz (51 kg)     GENERAL: Well-appearing elderly Caucasian female, alert, no distress and comfortable SKIN: skin color, texture, turgor are normal, no rashes or significant lesions EYES: conjunctiva are pink and non-injected, sclera clear LUNGS: clear to auscultation and percussion with normal breathing effort HEART: regular rate & rhythm and no murmurs and no lower extremity edema Musculoskeletal: no cyanosis of digits and no clubbing  PSYCH: alert & oriented x 3, fluent speech NEURO: no focal motor/sensory deficits  LABORATORY DATA:  I have reviewed the data as listed    Latest Ref Rng & Units 05/09/2022    1:05 PM 05/02/2022    2:13 PM 04/25/2022    2:02 PM  CBC  WBC 4.0 - 10.5 K/uL 5.2  5.2  7.3   Hemoglobin 12.0 - 15.0 g/dL 9.8  9.9  10.3   Hematocrit 36.0 - 46.0 % 27.8  28.5  29.7   Platelets 150 - 400 K/uL 271  262  264        Latest Ref Rng & Units 05/09/2022    1:05 PM 05/02/2022    2:13 PM 04/25/2022    2:02  PM  CMP  Glucose 70 - 99 mg/dL 111  106  117   BUN 8 - 23 mg/dL '13  17  14   ' Creatinine 0.44 - 1.00 mg/dL 0.74  0.69  0.73   Sodium 135 - 145 mmol/L 125  131  136   Potassium 3.5 - 5.1  mmol/L 3.9  3.9  4.2   Chloride 98 - 111 mmol/L 98  103  107   CO2 22 - 32 mmol/L '20  21  21   ' Calcium 8.9 - 10.3 mg/dL 8.7  9.0  9.2   Total Protein 6.5 - 8.1 g/dL 7.1  7.1  7.2   Total Bilirubin 0.3 - 1.2 mg/dL 0.3  0.3  0.5   Alkaline Phos 38 - 126 U/L 101  89  91   AST 15 - 41 U/L '16  14  18   ' ALT 0 - 44 U/L '7  7  10     ' Lab Results  Component Value Date   MPROTEIN 0.1 (H) 04/11/2022   MPROTEIN 0.5 (H) 03/14/2022   MPROTEIN 0.5 (H) 02/14/2022   Lab Results  Component Value Date   KPAFRELGTCHN 7.5 04/11/2022   KPAFRELGTCHN 13.7 03/14/2022   KPAFRELGTCHN 15.4 02/14/2022   LAMBDASER 4.9 (L) 04/11/2022   LAMBDASER 9.8 03/14/2022   LAMBDASER 11.3 02/14/2022   KAPLAMBRATIO 1.53 04/11/2022   KAPLAMBRATIO 1.40 03/14/2022   KAPLAMBRATIO 1.36 02/14/2022   RADIOGRAPHIC STUDIES: No results found.  ASSESSMENT & PLAN Jaanai Salemi Rister 88 y.o. female with medical history significant for newly diagnosed IgG lambda multiple myeloma who presents for a follow up visit.  #IgG Lambda Multiple Myeloma -- Diagnosis confirmed by bone marrow biopsy on 09/27/2021 which shows evidence of 20% involvement by plasma cells.  Additionally has anemia, macrocytosis, leukopenia, and concern for lytic lesions on bone imaging --Prognostic panel shows a deletion 17 with a TP53 mutation --Started Cycle 1 Day 1 of Velcade/Dex on 10/18/2021 --Switched to Dara/Dex on 03/14/2022 due to neuropathy secondary to Velcade.  Plan: --Due for Cycle 3 Day 1 of Dara/Dex today  --Labs from 04/11/2022 showed M protein 0.1 with a normal kappa lambda ratio. Today's myeloma labs pending.  --Labs today show white blood cell count 5.2, hemoglobin 9.8, MCV 91.4, and platelets of 271, Cr 0.74 --Continue with biweekly labs, follow up visit and Dara/Dex treatments.   # H/O Confusion --decreased dexamethasone to 20 mg on infusion days.  --Symptoms resolved.   #Leukopenia/Macrocytic anemia/Vitamin B12 deficiency: --Secondary to  chemotherapy and multiple myeloma --Today, hemoglobin 9.8 with MCV of 91.4 --Received vitamin B12 injection 1000 mcg weekly x 4 doses. Will continue now with monthly injections.   #Right rib pain, improved: --CT chest from 10/25/2021 that didn't show any new or progressive disease.   #Abdominal cramping--resolved: --Symptoms occur after velcade injection.  --Has Bentyl as needed  #Hyponatremia: --sodium level is 125 today --Advise to increase salt in diet  --patient is asymptomatic.   #Neuropathy: --Grade 2 involving finger tips and feet, worsening --Secondary to velcade  --Currently on Lyrica 25 mg PO BID.   #Supportive Care -- chemotherapy education complete -- port placement not required.  -- zofran 57m q8H PRN and compazine 123mPO q6H for nausea -- acyclovir 40014mO BID for VCZ prophylaxis. Patient was not taking medication as prescribed. Advised importance of taking medication consistently.  -- We will start Zometa as soon as is feasible after dental clearance  No orders of the defined types were placed in this encounter.  All questions were answered. The patient knows to call the clinic with any problems, questions or concerns.  I have spent a total of 30 minutes minutes of face-to-face and non-face-to-face time, preparing to see the patient, performing a medically appropriate examination, counseling and educating the patient, ordering medications/tests, documenting clinical information in the electronic health record, and care coordination.   Dede Query PA-C Dept of Hematology and [place] at [facility] Phone: [phone]     05/12/2022 8:46 AM

## 2022-05-12 NOTE — Telephone Encounter (Signed)
Per  workque called and spoke to pt about appointment  

## 2022-05-13 ENCOUNTER — Other Ambulatory Visit: Payer: Self-pay

## 2022-05-13 ENCOUNTER — Encounter (HOSPITAL_BASED_OUTPATIENT_CLINIC_OR_DEPARTMENT_OTHER): Payer: Self-pay | Admitting: Emergency Medicine

## 2022-05-13 ENCOUNTER — Emergency Department (HOSPITAL_BASED_OUTPATIENT_CLINIC_OR_DEPARTMENT_OTHER): Payer: Medicare PPO

## 2022-05-13 ENCOUNTER — Emergency Department (HOSPITAL_BASED_OUTPATIENT_CLINIC_OR_DEPARTMENT_OTHER)
Admission: EM | Admit: 2022-05-13 | Discharge: 2022-05-13 | Disposition: A | Discharge status: Home / Self Care | Payer: Medicare PPO | Attending: Emergency Medicine | Admitting: Emergency Medicine

## 2022-05-13 DIAGNOSIS — I10 Essential (primary) hypertension: Secondary | ICD-10-CM | POA: Diagnosis not present

## 2022-05-13 DIAGNOSIS — W19XXXA Unspecified fall, initial encounter: Secondary | ICD-10-CM

## 2022-05-13 DIAGNOSIS — S0101XA Laceration without foreign body of scalp, initial encounter: Secondary | ICD-10-CM | POA: Diagnosis not present

## 2022-05-13 DIAGNOSIS — S59902A Unspecified injury of left elbow, initial encounter: Secondary | ICD-10-CM | POA: Diagnosis present

## 2022-05-13 DIAGNOSIS — Z79899 Other long term (current) drug therapy: Secondary | ICD-10-CM | POA: Insufficient documentation

## 2022-05-13 DIAGNOSIS — W01198A Fall on same level from slipping, tripping and stumbling with subsequent striking against other object, initial encounter: Secondary | ICD-10-CM | POA: Insufficient documentation

## 2022-05-13 DIAGNOSIS — S51011A Laceration without foreign body of right elbow, initial encounter: Secondary | ICD-10-CM | POA: Insufficient documentation

## 2022-05-13 NOTE — ED Notes (Signed)
Wounds cleansed by MD Schlossman. Wounds covered with bacitracin and dressings. Pt statses she will see staff at [facility].

## 2022-05-13 NOTE — Discharge Instructions (Signed)
Keep your current dressing on for 24 hours then may change it 1-2 times a day. Keep your wound dry for the first 48 hours, after that you may get it wet but do not scrub the area.  Recommend dressing it with antibiotic ointment and a nonstick dressing.

## 2022-05-13 NOTE — ED Provider Notes (Signed)
[facility] EMERGENCY DEPARTMENT Provider Note   CSN: [id number] Arrival date & time: 05/13/22  2012     History {Add pertinent medical, surgical, social history, OB history to HPI:1} Chief Complaint  Patient presents with   Malorie Bigford is a 88 y.o. female.  HPI     Home Medications Prior to Admission medications   Medication Sig Start Date End Date Taking? Authorizing Provider  acyclovir (ZOVIRAX) 400 MG tablet TAKE ONE TABLET BY MOUTH TWICE DAILY 03/27/22   Orson Slick, MD  amLODipine (NORVASC) 10 MG tablet Take 10 mg by mouth daily.    [provider]  Calcium Citrate-Vitamin D (CALCIUM + D PO) Take 1 tablet by mouth daily.    [provider]  ciprofloxacin (CIPRO) 500 MG tablet SMARTSIG:1 Tablet(s) By Mouth Every 12 Hours 05/05/22   [provider]  dicyclomine (BENTYL) 10 MG capsule Take 1 capsule (10 mg total) by mouth 3 (three) times daily as needed for spasms. 01/28/22   Orson Slick, MD  fluconazole (DIFLUCAN) 150 MG tablet Take by mouth. 05/05/22   [provider]  gemfibrozil (LOPID) 600 MG tablet Take 600 mg by mouth 2 (two) times daily before a meal.    [provider]  HYDROcodone-acetaminophen (NORCO/VICODIN) 5-325 MG tablet TAKE ONE TABLET EVERY 6 HOURS AS NEEDED FOR MODERATE PAIN 05/09/22   Dede Query T, PA-C  levothyroxine (SYNTHROID, LEVOTHROID) 25 MCG tablet Take 25 mcg by mouth daily.     [provider]  LORazepam (ATIVAN) 0.5 MG tablet Take 0.5 mg by mouth daily as needed for anxiety or sleep. 05/14/21   [provider]  losartan (COZAAR) 50 MG tablet Take 50 mg by mouth daily.    [provider]  Multiple Vitamin (MULTIVITAMIN) capsule Take 1 capsule by mouth daily.    [provider]  ondansetron (ZOFRAN) 8 MG tablet Take 1 tablet (8 mg total) by mouth every 8 (eight) hours as needed. 10/08/21   Orson Slick, MD  pantoprazole (PROTONIX) 20 MG  tablet Take 20 mg by mouth daily.    [provider]  polyethylene glycol powder (GLYCOLAX/MIRALAX) 17 GM/SCOOP powder Take 17 g by mouth at bedtime. 07/26/19   [provider]  pregabalin (LYRICA) 25 MG capsule Take 1 capsule (25 mg total) by mouth 2 (two) times daily. 04/21/22   Lincoln Brigham, PA-C  pregabalin (LYRICA) 25 MG capsule Take 1 capsule (25 mg total) by mouth 2 (two) times daily. 04/21/22   Lincoln Brigham, PA-C      Allergies    Patient has no known allergies.    Review of Systems   Review of Systems  Physical Exam Updated Vital Signs BP (!) 144/66 (BP Location: Left Arm)   Pulse 85   Temp 97.8 F (36.6 C) (Oral)   Resp 20   Ht '5\' 1"'$  (1.549 m)   Wt 50.8 kg   SpO2 98%   BMI 21.16 kg/m  Physical Exam  ED Results / Procedures / Treatments   Labs (all labs ordered are listed, but only abnormal results are displayed) Labs Reviewed - No data to display  EKG None  Radiology CT Head Wo Contrast  Result Date: 05/13/2022 CLINICAL DATA:  [place], posterior scalp laceration EXAM: CT HEAD WITHOUT CONTRAST TECHNIQUE: Contiguous axial images were obtained from the base of the skull through the vertex without intravenous contrast. RADIATION DOSE REDUCTION: This exam was performed  according to the departmental dose-optimization program which includes automated exposure control, adjustment of the mA and/or kV according to patient size and/or use of iterative reconstruction technique. COMPARISON:  06/01/2019 FINDINGS: Brain: Stable scattered hypodensities throughout the periventricular white matter consistent with chronic small vessel ischemic change. No evidence of acute infarct or hemorrhage. The lateral ventricles and remaining midline structures are unremarkable. No acute extra-axial fluid collections. No mass effect. Vascular: Stable atherosclerosis.  No hyperdense vessel. Skull: Normal. Negative for fracture or focal lesion. Sinuses/Orbits: Kozel thickening within  the right sphenoid sinus. Remaining paranasal sinuses are clear. Other: None. IMPRESSION: 1. No acute intracranial process. 2. Right sphenoid sinus disease. Electronically Signed   By: Randa Ngo M.D.   On: 05/13/2022 21:09   DG Elbow Complete Right  Result Date: 05/13/2022 CLINICAL DATA:  Right elbow injury, fall. EXAM: RIGHT ELBOW - COMPLETE 3+ VIEW COMPARISON:  None Available. FINDINGS: There is no evidence of fracture or dislocation. Can not assess for joint effusion secondary to positioning of the lateral view. There is no evidence of arthropathy or other focal bone abnormality. Soft tissues are unremarkable. IMPRESSION: No acute displaced fracture or dislocation. Can not assess for joint effusion secondary to positioning of the lateral view. Electronically Signed   By: Ronney Asters M.D.   On: 05/13/2022 21:08    Procedures Procedures  {Document cardiac monitor, telemetry assessment procedure when appropriate:1}  Medications Ordered in ED Medications - No data to display  ED Course/ Medical Decision Making/ A&P                           Medical Decision Making Amount and/or Complexity of Data Reviewed Radiology: ordered.   ***  {Document critical care time when appropriate:1} {Document review of labs and clinical decision tools ie heart score, Chads2Vasc2 etc:1}  {Document your independent review of radiology images, and any outside records:1} {Document your discussion with family members, caretakers, and with consultants:1} {Document social determinants of health affecting pt's care:1} {Document your decision making why or why not admission, treatments were needed:1} Final Clinical Impression(s) / ED Diagnoses Final diagnoses:  None    Rx / DC Orders ED Discharge Orders     None

## 2022-05-13 NOTE — ED Triage Notes (Signed)
Mechanical fall this evening. Posterior head laceration and abrasion to right elbow. Denies blood thinners, LOC, hip/neck/back pain.

## 2022-05-14 ENCOUNTER — Other Ambulatory Visit: Payer: Self-pay

## 2022-05-15 LAB — MULTIPLE MYELOMA PANEL, SERUM
Albumin SerPl Elph-Mcnc: 3.4 g/dL (ref 2.9–4.4)
Albumin/Glob SerPl: 1.1 (ref 0.7–1.7)
Alpha 1: 0.3 g/dL (ref 0.0–0.4)
Alpha2 Glob SerPl Elph-Mcnc: 1.1 g/dL — ABNORMAL HIGH (ref 0.4–1.0)
B-Globulin SerPl Elph-Mcnc: 1.3 g/dL (ref 0.7–1.3)
Gamma Glob SerPl Elph-Mcnc: 0.3 g/dL — ABNORMAL LOW (ref 0.4–1.8)
Globulin, Total: 3.1 g/dL (ref 2.2–3.9)
IgA: 13 mg/dL — ABNORMAL LOW (ref 64–422)
IgG (Immunoglobin G), Serum: 742 mg/dL (ref 586–1602)
IgM (Immunoglobulin M), Srm: 14 mg/dL — ABNORMAL LOW (ref 26–217)
M Protein SerPl Elph-Mcnc: 0.2 g/dL — ABNORMAL HIGH
Total Protein ELP: 6.5 g/dL (ref 6.0–8.5)

## 2022-05-18 ENCOUNTER — Other Ambulatory Visit: Payer: Self-pay

## 2022-05-23 ENCOUNTER — Inpatient Hospital Stay (HOSPITAL_BASED_OUTPATIENT_CLINIC_OR_DEPARTMENT_OTHER): Payer: Medicare PPO | Admitting: Hematology and Oncology

## 2022-05-23 ENCOUNTER — Inpatient Hospital Stay: Payer: Medicare PPO

## 2022-05-23 ENCOUNTER — Inpatient Hospital Stay: Payer: Medicare PPO | Attending: Hematology and Oncology

## 2022-05-23 VITALS — BP 151/59 | HR 75 | Temp 97.7°F | Resp 16 | Wt 112.1 lb

## 2022-05-23 DIAGNOSIS — Z800 Family history of malignant neoplasm of digestive organs: Secondary | ICD-10-CM | POA: Diagnosis not present

## 2022-05-23 DIAGNOSIS — C9000 Multiple myeloma not having achieved remission: Secondary | ICD-10-CM | POA: Diagnosis not present

## 2022-05-23 DIAGNOSIS — G620 Drug-induced polyneuropathy: Secondary | ICD-10-CM | POA: Insufficient documentation

## 2022-05-23 DIAGNOSIS — Z5112 Encounter for antineoplastic immunotherapy: Secondary | ICD-10-CM | POA: Diagnosis not present

## 2022-05-23 DIAGNOSIS — T451X5D Adverse effect of antineoplastic and immunosuppressive drugs, subsequent encounter: Secondary | ICD-10-CM | POA: Diagnosis not present

## 2022-05-23 DIAGNOSIS — E538 Deficiency of other specified B group vitamins: Secondary | ICD-10-CM | POA: Insufficient documentation

## 2022-05-23 DIAGNOSIS — Z79899 Other long term (current) drug therapy: Secondary | ICD-10-CM | POA: Insufficient documentation

## 2022-05-23 DIAGNOSIS — E871 Hypo-osmolality and hyponatremia: Secondary | ICD-10-CM | POA: Insufficient documentation

## 2022-05-23 LAB — CBC WITH DIFFERENTIAL (CANCER CENTER ONLY)
Abs Immature Granulocytes: 0 10*3/uL (ref 0.00–0.07)
Basophils Absolute: 0 10*3/uL (ref 0.0–0.1)
Basophils Relative: 1 %
Eosinophils Absolute: 0.1 10*3/uL (ref 0.0–0.5)
Eosinophils Relative: 4 %
HCT: 27.7 % — ABNORMAL LOW (ref 36.0–46.0)
Hemoglobin: 9.7 g/dL — ABNORMAL LOW (ref 12.0–15.0)
Immature Granulocytes: 0 %
Lymphocytes Relative: 27 %
Lymphs Abs: 0.9 10*3/uL (ref 0.7–4.0)
MCH: 31.5 pg (ref 26.0–34.0)
MCHC: 35 g/dL (ref 30.0–36.0)
MCV: 89.9 fL (ref 80.0–100.0)
Monocytes Absolute: 0.5 10*3/uL (ref 0.1–1.0)
Monocytes Relative: 14 %
Neutro Abs: 1.9 10*3/uL (ref 1.7–7.7)
Neutrophils Relative %: 54 %
Platelet Count: 322 10*3/uL (ref 150–400)
RBC: 3.08 MIL/uL — ABNORMAL LOW (ref 3.87–5.11)
RDW: 13.2 % (ref 11.5–15.5)
WBC Count: 3.4 10*3/uL — ABNORMAL LOW (ref 4.0–10.5)
nRBC: 0 % (ref 0.0–0.2)

## 2022-05-23 LAB — CMP (CANCER CENTER ONLY)
ALT: 7 U/L (ref 0–44)
AST: 17 U/L (ref 15–41)
Albumin: 3.8 g/dL (ref 3.5–5.0)
Alkaline Phosphatase: 87 U/L (ref 38–126)
Anion gap: 9 (ref 5–15)
BUN: 14 mg/dL (ref 8–23)
CO2: 20 mmol/L — ABNORMAL LOW (ref 22–32)
Calcium: 8.7 mg/dL — ABNORMAL LOW (ref 8.9–10.3)
Chloride: 104 mmol/L (ref 98–111)
Creatinine: 0.67 mg/dL (ref 0.44–1.00)
GFR, Estimated: >60 mL/min (ref >60)
Glucose, Bld: 93 mg/dL (ref 70–99)
Potassium: 3.8 mmol/L (ref 3.5–5.1)
Sodium: 133 mmol/L — ABNORMAL LOW (ref 135–145)
Total Bilirubin: 0.3 mg/dL (ref 0.3–1.2)
Total Protein: 6.8 g/dL (ref 6.5–8.1)

## 2022-05-23 LAB — LACTATE DEHYDROGENASE: LDH: 141 U/L (ref 98–192)

## 2022-05-23 MED ORDER — DARATUMUMAB-HYALURONIDASE-FIHJ 1800-30000 MG-UT/15ML ~~LOC~~ SOLN
1800.0000 mg | Freq: Once | SUBCUTANEOUS | Status: AC
  Administered 2022-05-23: 1800 mg via SUBCUTANEOUS
  Filled 2022-05-23: qty 15

## 2022-05-23 MED ORDER — DIPHENHYDRAMINE HCL 25 MG PO CAPS
50.0000 mg | ORAL_CAPSULE | Freq: Once | ORAL | Status: AC
  Administered 2022-05-23: 50 mg via ORAL
  Filled 2022-05-23: qty 2

## 2022-05-23 MED ORDER — ACETAMINOPHEN 325 MG PO TABS
650.0000 mg | ORAL_TABLET | Freq: Once | ORAL | Status: AC
  Administered 2022-05-23: 650 mg via ORAL
  Filled 2022-05-23: qty 2

## 2022-05-23 MED ORDER — DEXAMETHASONE 4 MG PO TABS
20.0000 mg | ORAL_TABLET | Freq: Once | ORAL | Status: AC
  Administered 2022-05-23: 20 mg via ORAL
  Filled 2022-05-23: qty 5

## 2022-05-23 NOTE — Patient Instructions (Signed)
[facility] MEDICAL ONCOLOGY  Discharge Instructions: Thank you for choosing [facility] to provide your oncology and hematology care.   If you have a lab appointment with the Cancer Center, please go directly to the Cancer Center and check in at the registration area.   Wear comfortable clothing and clothing appropriate for easy access to any Portacath or PICC line.   We strive to give you quality time with your provider. You may need to reschedule your appointment if you arrive late (15 or more minutes).  Arriving late affects you and other patients whose appointments are after yours.  Also, if you miss three or more appointments without notifying the office, you may be dismissed from the clinic at the provider's discretion.      For prescription refill requests, have your pharmacy contact our office and allow 72 hours for refills to be completed.    Today you received the following chemotherapy and/or immunotherapy agents: daratumumab- hyaluronidase      To help prevent nausea and vomiting after your treatment, we encourage you to take your nausea medication as directed.  BELOW ARE SYMPTOMS THAT SHOULD BE REPORTED IMMEDIATELY: *FEVER GREATER THAN 100.4 F (38 C) OR HIGHER *CHILLS OR SWEATING *NAUSEA AND VOMITING THAT IS NOT CONTROLLED WITH YOUR NAUSEA MEDICATION *UNUSUAL SHORTNESS OF BREATH *UNUSUAL BRUISING OR BLEEDING *URINARY PROBLEMS (pain or burning when urinating, or frequent urination) *BOWEL PROBLEMS (unusual diarrhea, constipation, pain near the anus) TENDERNESS IN MOUTH AND THROAT WITH OR WITHOUT PRESENCE OF ULCERS (sore throat, sores in mouth, or a toothache) UNUSUAL RASH, SWELLING OR PAIN  UNUSUAL VAGINAL DISCHARGE OR ITCHING   Items with * indicate a potential emergency and should be followed up as soon as possible or go to the Emergency Department if any problems should occur.  Please show the CHEMOTHERAPY ALERT CARD or IMMUNOTHERAPY ALERT  CARD at check-in to the Emergency Department and triage nurse.  Should you have questions after your visit or need to cancel or reschedule your appointment, please contact [facility] MEDICAL ONCOLOG[facility]  Dept: [phone]  and follow the prompts.  Office hours are 8:00 a.m. to 4:30 p.m. Monday - Friday. Please note that voicemails left after 4:00 p.m. may not be returned until the following business day.  We are closed weekends and major holidays. You have access to a nurse at all times for urgent questions. Please call the main number to the clinic Dept: [phone] and follow the prompts.   For any non-urgent questions, you may also contact your provider using MyChart. We now offer e-Visits for anyone 18 and older to request care online for non-urgent symptoms. For details visit [email or web address].   Also download the MyChart app! Go to the app store, search "MyChart", open the app, select [facility], and log in with your MyChart username and password.  Masks are optional in the cancer centers. If you would like for your care team to wear a mask while they are taking care of you, please let them know. You may have one support person who is at least 86 years old accompany you for your appointments. 

## 2022-05-24 ENCOUNTER — Other Ambulatory Visit: Payer: Self-pay

## 2022-05-24 LAB — BETA 2 MICROGLOBULIN, SERUM: Beta-2 Microglobulin: 3.2 mg/L — ABNORMAL HIGH (ref 0.6–2.4)

## 2022-05-26 LAB — KAPPA/LAMBDA LIGHT CHAINS
Kappa free light chain: 10.9 mg/L (ref 3.3–19.4)
Kappa, lambda light chain ratio: 1.76 — ABNORMAL HIGH (ref 0.26–1.65)
Lambda free light chains: 6.2 mg/L (ref 5.7–26.3)

## 2022-05-28 LAB — MULTIPLE MYELOMA PANEL, SERUM
Albumin SerPl Elph-Mcnc: 3.3 g/dL (ref 2.9–4.4)
Albumin/Glob SerPl: 1.2 (ref 0.7–1.7)
Alpha 1: 0.3 g/dL (ref 0.0–0.4)
Alpha2 Glob SerPl Elph-Mcnc: 0.9 g/dL (ref 0.4–1.0)
B-Globulin SerPl Elph-Mcnc: 1.2 g/dL (ref 0.7–1.3)
Gamma Glob SerPl Elph-Mcnc: 0.4 g/dL (ref 0.4–1.8)
Globulin, Total: 2.9 g/dL (ref 2.2–3.9)
IgA: 16 mg/dL — ABNORMAL LOW (ref 64–422)
IgG (Immunoglobin G), Serum: 752 mg/dL (ref 586–1602)
IgM (Immunoglobulin M), Srm: 16 mg/dL — ABNORMAL LOW (ref 26–217)
M Protein SerPl Elph-Mcnc: 0.2 g/dL — ABNORMAL HIGH
Total Protein ELP: 6.2 g/dL (ref 6.0–8.5)

## 2022-06-03 ENCOUNTER — Encounter: Payer: Self-pay | Admitting: Hematology and Oncology

## 2022-06-03 NOTE — Progress Notes (Signed)
[place] [phone]   [phone]  PROGRESS NOTE  Patient Care Team: Burnard Bunting, MD as PCP - General (Internal Medicine)  Hematological/Oncological History # IgG Lambda Multiple Myeloma 06/04/2021: WBC 5.5, Hgb 11.1, MCV 101.3, Plt 306 07/24/2021: M protein 2.3, IFE shows IgG Lambda monoclonal specificity. Cr 0.9 08/07/2021: Establish care with Dr. Lorenso Courier 09/27/2021: Bmbx showed hypercellular bone marrow with plasma cell neoplasm, increased number of atypical plasma cells averaging 20% of all cells in the aspirate 10/18/2021: Cycle 1 of Velcade/Dexamethasone 11/08/2021: Cycle 2 of Velcade/Dexamethasone 11/29/2021: Cycle 3 of Velcade/Dexamethasone 12/20/2021: Cycle 4 of Velcade/Dexamethasone 01/10/2022: Cycle 5 of Velcade/Dexamethasone 01/31/2022: Cycle 6 of Velcade/Dexamethasone 02/28/2022: Cycle 7 of Velcade/Dexamethasone. Dose reduced velcade due to neuropathy  03/14/2022: Cycle 1 Day 1 of Dara/Dex (velcade d/c due to neuropathy).  04/11/2022: Cycle 2 Day 1 of Dara/Dex 05/10/1011: Cycle 3 Day 1 of Dara/Dex  Interval History:  [name] Page 88 y.o. female with medical history significant for  IgG lambda multiple myeloma who presents for a follow up visit. The patient's last visit was on 05/09/2022. In the interim, patient has had no major changes in her health. She is due for Cycle 3, Day 15.  On exam today Mrs. Righi reports she feels as though the treatment is "working out well".  She reports that she does take an occasional nap and that her energy is improving.  Her appetite has been strong and she is tolerating the shots without any major side effects.  She has had no trouble with nausea, vomiting, or diarrhea.  She is willing and able to proceed with treatment at this time.  Patient denies any fevers, chills, night sweats, shortness of breath, chest pain or cough.She has no other complaints.  Rest of the 10 point ROS is below.  MEDICAL HISTORY:   Past Medical History:  Diagnosis Date   Alopecia 10/28/2016   Anginal pain (HCC)    Arthritis    Cervical spondylosis    C3-4 AND C4-5   Chest pain 07/28/2008   H/O, normal stress nuclear EF 78%   Chronic cough 07/12/2014   Collapsed lung 07/2010   being treated at [facility] - in left lung   Complication of anesthesia    Cough 07/30/2010   Qualifier: Diagnosis of  By: Chase Caller MD, Murali     Cystocele 05/23/2015   Cystocele, midline 05/02/2013   DEEP VEIN THROMBOSIS/PHLEBITIS 07/30/2010   Qualifier: History of  By: Harvest Dark CMA, Jennifer     Dislocation closed, shoulder 02/2013   DVT (deep venous thrombosis) ([place]) 2006   Encounter for therapeutic drug monitoring 10/03/2014   GERD (gastroesophageal reflux disease)    occ   Headache    hx   High cholesterol    History of recurrent UTIs 09/24/2011   HYPERLIPIDEMIA 07/30/2010   Qualifier: Diagnosis of  By: Harvest Dark CMA, Jennifer     Hypertension    HYPERTENSION 07/30/2010   Qualifier: Diagnosis of  By: Harvest Dark CMA, Jennifer     Hypothyroidism    Nasal itching 01/30/2014   Osteopenia 05/23/2015   Peripheral vascular disease ([place]) 2006   dvt's and 50 yrs ago   Pleuritic pain 12/17/2015   PONV (postoperative nausea and vomiting)    Primary localized osteoarthritis of right hip 08/26/2016   Primary osteoarthritis of right hip 08/26/2016   Pulmonary collapse 07/30/2010   Qualifier: Diagnosis of  By: Chase Caller MD, Murali     PULMONARY NODULE 07/30/2010   Qualifier: Diagnosis of  By:  Ramaswamy MD, Murali     Rash and nonspecific skin eruption 01/02/2015   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   Sarcoidosis    Sarcoidosis of lung ([place])    Vaginal atrophy 09/24/2011   Visual field defect nasal step 01/02/2015    SURGICAL HISTORY: Past Surgical History:  Procedure Laterality Date   BACK SURGERY  2022   EYE SURGERY     FOOT SURGERY Left 06/2005   bunion hammer toe   HEMORRHOID SURGERY  12/1972   INTRAOCULAR LENS  INSERTION     right eye 10 /16 and left 06/16/2005   JOINT REPLACEMENT     KYPHOPLASTY N/A 06/27/2021   Procedure: LUMBAR 3 AND LUMBAR  5 KYPHOPLASTY;  Surgeon: Phylliss Bob, MD;  Location: [facility];  Service: Orthopedics;  Laterality: N/A;   KYPHOPLASTY N/A 09/04/2021   Procedure: THORACIC SEVEN, THORACIC EIGHT, THORACIC NINE, THORACIC TEN KYPHOPLASTY;  Surgeon: Phylliss Bob, MD;  Location: [facility];  Service: Orthopedics;  Laterality: N/A;   SKIN BIOPSY  01/2007   basal cell carcinoma removed from right lower leg    TOTAL HIP ARTHROPLASTY  06/2007   left   TOTAL HIP ARTHROPLASTY Right 08/26/2016   Procedure: TOTAL HIP ARTHROPLASTY ANTERIOR APPROACH;  Surgeon: Melrose Nakayama, MD;  Location: [facility];  Service: Orthopedics;  Laterality: Right;   TOTAL KNEE ARTHROPLASTY Right 10/2007   right    SOCIAL HISTORY: Social History   Socioeconomic History   Marital status: Married    Spouse name: Not on file   Number of children: Not on file   Years of education: Not on file   Highest education level: Not on file  Occupational History   Occupation: retired    Fish farm manager: RETIRED  Tobacco Use   Smoking status: Never   Smokeless tobacco: Never  Vaping Use   Vaping Use: Never used  Substance and Sexual Activity   Alcohol use: Not Currently    Comment: BEER/glass of wine A DAY   Drug use: No   Sexual activity: Never  Other Topics Concern   Not on file  Social History Narrative   Not on file   Social Determinants of Health   Financial Resource Strain: Not on file  Food Insecurity: Not on file  Transportation Needs: Not on file  Physical Activity: Not on file  Stress: Not on file  Social Connections: Not on file  Intimate Partner Violence: Not on file    FAMILY HISTORY: Family History  Problem Relation Age of Onset   Diabetes Mother    Heart failure Mother    Hypertension Father    Diabetes Father    Heart disease Father    Heart attack Father     ALLERGIES:  has No Known  Allergies.  MEDICATIONS:  Current Outpatient Medications  Medication Sig Dispense Refill   b complex vitamins capsule Take 1 capsule by mouth daily.     acyclovir (ZOVIRAX) 400 MG tablet TAKE ONE TABLET BY MOUTH TWICE DAILY 60 tablet 2   amLODipine (NORVASC) 10 MG tablet Take 10 mg by mouth daily.     dicyclomine (BENTYL) 10 MG capsule Take 1 capsule (10 mg total) by mouth 3 (three) times daily as needed for spasms. 90 capsule 0   gemfibrozil (LOPID) 600 MG tablet Take 600 mg by mouth 2 (two) times daily before a meal.     HYDROcodone-acetaminophen (NORCO/VICODIN) 5-325 MG tablet TAKE ONE TABLET EVERY 6 HOURS AS NEEDED FOR MODERATE PAIN 30 tablet 0  levothyroxine (SYNTHROID, LEVOTHROID) 25 MCG tablet Take 25 mcg by mouth daily.      LORazepam (ATIVAN) 0.5 MG tablet Take 0.5 mg by mouth daily as needed for anxiety or sleep.     losartan (COZAAR) 50 MG tablet Take 50 mg by mouth daily.     Multiple Vitamin (MULTIVITAMIN) capsule Take 1 capsule by mouth daily.     ondansetron (ZOFRAN) 8 MG tablet Take 1 tablet (8 mg total) by mouth every 8 (eight) hours as needed. 30 tablet 0   pantoprazole (PROTONIX) 20 MG tablet Take 20 mg by mouth daily.     polyethylene glycol powder (GLYCOLAX/MIRALAX) 17 GM/SCOOP powder Take 17 g by mouth at bedtime.     pregabalin (LYRICA) 25 MG capsule Take 1 capsule (25 mg total) by mouth 2 (two) times daily. 60 capsule 1   pregabalin (LYRICA) 25 MG capsule Take 1 capsule (25 mg total) by mouth 2 (two) times daily. 60 capsule 1   No current facility-administered medications for this visit.    REVIEW OF SYSTEMS:   Constitutional: ( - ) fevers, ( - )  chills , ( - ) night sweats Eyes: ( - ) blurriness of vision, ( - ) double vision, ( - ) watery eyes Ears, nose, mouth, throat, and face: ( - ) mucositis, ( - ) sore throat Respiratory: ( - ) cough, ( - ) dyspnea, ( - ) wheezes Cardiovascular: ( - ) palpitation, ( - ) chest discomfort, ( - ) lower extremity  swelling Gastrointestinal:  ( - ) nausea, ( - ) heartburn, ( - ) change in bowel habits Skin: ( - ) abnormal skin rashes Lymphatics: ( - ) new lymphadenopathy, ( - ) easy bruising Neurological: (+ ) numbness, ( +) tingling, ( - ) new weaknesses Behavioral/Psych: ( - ) mood change, ( - ) new changes  All other systems were reviewed with the patient and are negative.  PHYSICAL EXAMINATION: ECOG PERFORMANCE STATUS: 2 - Symptomatic, <50% confined to bed  Vitals:   05/23/22 1140  BP: (!) 151/59  Pulse: 75  Resp: 16  Temp: 97.7 F (36.5 C)  SpO2: 99%    Filed Weights   05/23/22 1140  Weight: 112 lb 1.6 oz (50.8 kg)   GENERAL: Well-appearing elderly Caucasian female, alert, no distress and comfortable SKIN: skin color, texture, turgor are normal, no rashes or significant lesions EYES: conjunctiva are pink and non-injected, sclera clear LUNGS: clear to auscultation and percussion with normal breathing effort HEART: regular rate & rhythm and no murmurs and no lower extremity edema Musculoskeletal: no cyanosis of digits and no clubbing  PSYCH: alert & oriented x 3, fluent speech NEURO: no focal motor/sensory deficits  LABORATORY DATA:  I have reviewed the data as listed    Latest Ref Rng & Units 05/23/2022   11:22 AM 05/09/2022    1:05 PM 05/02/2022    2:13 PM  CBC  WBC 4.0 - 10.5 K/uL 3.4  5.2  5.2   Hemoglobin 12.0 - 15.0 g/dL 9.7  9.8  9.9   Hematocrit 36.0 - 46.0 % 27.7  27.8  28.5   Platelets 150 - 400 K/uL 322  271  262        Latest Ref Rng & Units 05/23/2022   10:32 AM 05/09/2022    1:05 PM 05/02/2022    2:13 PM  CMP  Glucose 70 - 99 mg/dL 93  111  106   BUN 8 - 23 mg/dL 14  13  17   Creatinine 0.44 - 1.00 mg/dL 0.67  0.74  0.69   Sodium 135 - 145 mmol/L 133  125  131   Potassium 3.5 - 5.1 mmol/L 3.8  3.9  3.9   Chloride 98 - 111 mmol/L 104  98  103   CO2 22 - 32 mmol/L '20  20  21   ' Calcium 8.9 - 10.3 mg/dL 8.7  8.7  9.0   Total Protein 6.5 - 8.1 g/dL 6.8  7.1   7.1   Total Bilirubin 0.3 - 1.2 mg/dL 0.3  0.3  0.3   Alkaline Phos 38 - 126 U/L 87  101  89   AST 15 - 41 U/L '17  16  14   ' ALT 0 - 44 U/L '7  7  7     ' Lab Results  Component Value Date   MPROTEIN 0.2 (H) 05/23/2022   MPROTEIN 0.2 (H) 05/09/2022   MPROTEIN 0.1 (H) 04/11/2022   Lab Results  Component Value Date   KPAFRELGTCHN 10.9 05/23/2022   KPAFRELGTCHN 9.9 05/09/2022   KPAFRELGTCHN 7.5 04/11/2022   LAMBDASER 6.2 05/23/2022   LAMBDASER 7.9 05/09/2022   LAMBDASER 4.9 (L) 04/11/2022   KAPLAMBRATIO 1.76 (H) 05/23/2022   KAPLAMBRATIO 1.25 05/09/2022   KAPLAMBRATIO 1.53 04/11/2022   RADIOGRAPHIC STUDIES: CT Head Wo Contrast  Result Date: 05/13/2022 CLINICAL DATA:  [place], posterior scalp laceration EXAM: CT HEAD WITHOUT CONTRAST TECHNIQUE: Contiguous axial images were obtained from the base of the skull through the vertex without intravenous contrast. RADIATION DOSE REDUCTION: This exam was performed according to the departmental dose-optimization program which includes automated exposure control, adjustment of the mA and/or kV according to patient size and/or use of iterative reconstruction technique. COMPARISON:  06/01/2019 FINDINGS: Brain: Stable scattered hypodensities throughout the periventricular white matter consistent with chronic small vessel ischemic change. No evidence of acute infarct or hemorrhage. The lateral ventricles and remaining midline structures are unremarkable. No acute extra-axial fluid collections. No mass effect. Vascular: Stable atherosclerosis.  No hyperdense vessel. Skull: Normal. Negative for fracture or focal lesion. Sinuses/Orbits: Kozel thickening within the right sphenoid sinus. Remaining paranasal sinuses are clear. Other: None. IMPRESSION: 1. No acute intracranial process. 2. Right sphenoid sinus disease. Electronically Signed   By: Randa Ngo M.D.   On: 05/13/2022 21:09   DG Elbow Complete Right  Result Date: 05/13/2022 CLINICAL DATA:  Right elbow  injury, fall. EXAM: RIGHT ELBOW - COMPLETE 3+ VIEW COMPARISON:  None Available. FINDINGS: There is no evidence of fracture or dislocation. Can not assess for joint effusion secondary to positioning of the lateral view. There is no evidence of arthropathy or other focal bone abnormality. Soft tissues are unremarkable. IMPRESSION: No acute displaced fracture or dislocation. Can not assess for joint effusion secondary to positioning of the lateral view. Electronically Signed   By: Ronney Asters M.D.   On: 05/13/2022 21:08    ASSESSMENT & PLAN [name] Page 88 y.o. female with medical history significant for newly diagnosed IgG lambda multiple myeloma who presents for a follow up visit.  #IgG Lambda Multiple Myeloma -- Diagnosis confirmed by bone marrow biopsy on 09/27/2021 which shows evidence of 20% involvement by plasma cells.  Additionally has anemia, macrocytosis, leukopenia, and concern for lytic lesions on bone imaging --Prognostic panel shows a deletion 17 with a TP53 mutation --Started Cycle 1 Day 1 of Velcade/Dex on 10/18/2021 --Switched to Dara/Dex on 03/14/2022 due to neuropathy secondary to Velcade.  Plan: --Due for Cycle 3 Day 15  of Dara/Dex today  --Labs from 04/11/2022 showed M protein 0.1 with a normal kappa lambda ratio. Today's myeloma labs pending.  --Labs today show white blood cell count 3.4, hemoglobin 9.7, MCV 89.9, and platelets of 322, Cr 0.67 --Continue with biweekly labs, follow up visit and Dara/Dex treatments.   # H/O Confusion --decreased dexamethasone to 20 mg on infusion days.  --Symptoms resolved.   #Leukopenia/Macrocytic anemia/Vitamin B12 deficiency: --Secondary to chemotherapy and multiple myeloma --Today, hemoglobin 9.7 with MCV of 89.9 --Received vitamin B12 injection 1000 mcg weekly x 4 doses. Will continue now with monthly injections.   #Right rib pain, improved: --CT chest from 10/25/2021 that didn't show any new or progressive disease.   #Abdominal  cramping--resolved: --Symptoms occur after velcade injection.  --Has Bentyl as needed  #Hyponatremia: --sodium level is 133 today --Advise to increase salt in diet  --patient is asymptomatic.   #Neuropathy: --Grade 2 involving finger tips and feet, worsening --Secondary to velcade  --Currently on Lyrica 25 mg PO BID.   #Supportive Care -- chemotherapy education complete -- port placement not required.  -- zofran 25m q8H PRN and compazine 132mPO q6H for nausea -- acyclovir 40055mO BID for VCZ prophylaxis. Patient was not taking medication as prescribed. Advised importance of taking medication consistently.  -- We will start Zometa as soon as is feasible after dental clearance  Orders Placed This Encounter  Procedures   CMP ([facility])    Standing Status:   Future    Standing Expiration Date:   06/21/2023   Lactate dehydrogenase (LDH)    Standing Status:   Future    Standing Expiration Date:   06/20/2023   CBC with Differential ([facility])    Standing Status:   Future    Standing Expiration Date:   06/21/2023   CMP ([facility])    Standing Status:   Future    Standing Expiration Date:   07/05/2023   Multiple Myeloma Panel (SPEP&IFE w/QIG)    Standing Status:   Future    Standing Expiration Date:   07/04/2023   Lactate dehydrogenase (LDH)    Standing Status:   Future    Standing Expiration Date:   07/04/2023   Kappa/lambda light chains    Standing Status:   Future    Standing Expiration Date:   07/04/2023   CBC with Differential ([facility])    Standing Status:   Future    Standing Expiration Date:   07/05/2023   CMP ([facility])    Standing Status:   Future    Standing Expiration Date:   07/19/2023   Lactate dehydrogenase (LDH)    Standing Status:   Future    Standing Expiration Date:   07/18/2023   CBC with Differential ([facility])    Standing Status:   Future    Standing Expiration Date:   07/19/2023   CMP  ([facility])    Standing Status:   Future    Standing Expiration Date:   08/02/2023   Multiple Myeloma Panel (SPEP&IFE w/QIG)    Standing Status:   Future    Standing Expiration Date:   08/01/2023   Lactate dehydrogenase (LDH)    Standing Status:   Future    Standing Expiration Date:   08/01/2023   Kappa/lambda light chains    Standing Status:   Future    Standing Expiration Date:   08/01/2023   CBC with Differential ([facility])  Standing Status:   Future    Standing Expiration Date:   08/02/2023   CMP ([facility] only)    Standing Status:   Future    Standing Expiration Date:   08/16/2023   Lactate dehydrogenase (LDH)    Standing Status:   Future    Standing Expiration Date:   08/15/2023   CBC with Differential ([facility] Only)    Standing Status:   Future    Standing Expiration Date:   08/16/2023   All questions were answered. The patient knows to call the clinic with any problems, questions or concerns.  I have spent a total of 30 minutes minutes of face-to-face and non-face-to-face time, preparing to see the patient, performing a medically appropriate examination, counseling and educating the patient, ordering medications/tests, documenting clinical information in the electronic health record, and care coordination.   Ledell Peoples, MD Department of Hematology/Oncology [place] [facility] Phone: [phone] Pager: [phone] Email: [email or web address]  06/03/2022 2:46 PM

## 2022-06-04 ENCOUNTER — Other Ambulatory Visit: Payer: Self-pay

## 2022-06-06 ENCOUNTER — Inpatient Hospital Stay: Payer: Medicare PPO | Admitting: Physician Assistant

## 2022-06-06 ENCOUNTER — Inpatient Hospital Stay: Payer: Medicare PPO

## 2022-06-06 VITALS — BP 134/62 | HR 72 | Temp 98.2°F | Resp 18

## 2022-06-06 DIAGNOSIS — C9000 Multiple myeloma not having achieved remission: Secondary | ICD-10-CM | POA: Diagnosis not present

## 2022-06-06 DIAGNOSIS — E538 Deficiency of other specified B group vitamins: Secondary | ICD-10-CM

## 2022-06-06 DIAGNOSIS — Z5112 Encounter for antineoplastic immunotherapy: Secondary | ICD-10-CM | POA: Diagnosis not present

## 2022-06-06 LAB — CBC WITH DIFFERENTIAL (CANCER CENTER ONLY)
Abs Immature Granulocytes: 0.01 10*3/uL (ref 0.00–0.07)
Basophils Absolute: 0 10*3/uL (ref 0.0–0.1)
Basophils Relative: 1 %
Eosinophils Absolute: 0.1 10*3/uL (ref 0.0–0.5)
Eosinophils Relative: 3 %
HCT: 28.3 % — ABNORMAL LOW (ref 36.0–46.0)
Hemoglobin: 9.8 g/dL — ABNORMAL LOW (ref 12.0–15.0)
Immature Granulocytes: 0 %
Lymphocytes Relative: 38 %
Lymphs Abs: 1.4 10*3/uL (ref 0.7–4.0)
MCH: 31.8 pg (ref 26.0–34.0)
MCHC: 34.6 g/dL (ref 30.0–36.0)
MCV: 91.9 fL (ref 80.0–100.0)
Monocytes Absolute: 0.5 10*3/uL (ref 0.1–1.0)
Monocytes Relative: 14 %
Neutro Abs: 1.6 10*3/uL — ABNORMAL LOW (ref 1.7–7.7)
Neutrophils Relative %: 44 %
Platelet Count: 269 10*3/uL (ref 150–400)
RBC: 3.08 MIL/uL — ABNORMAL LOW (ref 3.87–5.11)
RDW: 13.6 % (ref 11.5–15.5)
WBC Count: 3.6 10*3/uL — ABNORMAL LOW (ref 4.0–10.5)
nRBC: 0 % (ref 0.0–0.2)

## 2022-06-06 LAB — CMP (CANCER CENTER ONLY)
ALT: 7 U/L (ref 0–44)
AST: 17 U/L (ref 15–41)
Albumin: 3.9 g/dL (ref 3.5–5.0)
Alkaline Phosphatase: 89 U/L (ref 38–126)
Anion gap: 7 (ref 5–15)
BUN: 15 mg/dL (ref 8–23)
CO2: 22 mmol/L (ref 22–32)
Calcium: 8.8 mg/dL — ABNORMAL LOW (ref 8.9–10.3)
Chloride: 106 mmol/L (ref 98–111)
Creatinine: 0.66 mg/dL (ref 0.44–1.00)
GFR, Estimated: >60 mL/min (ref >60)
Glucose, Bld: 88 mg/dL (ref 70–99)
Potassium: 4.2 mmol/L (ref 3.5–5.1)
Sodium: 135 mmol/L (ref 135–145)
Total Bilirubin: 0.3 mg/dL (ref 0.3–1.2)
Total Protein: 6.8 g/dL (ref 6.5–8.1)

## 2022-06-06 LAB — LACTATE DEHYDROGENASE: LDH: 130 U/L (ref 98–192)

## 2022-06-06 MED ORDER — DEXAMETHASONE 4 MG PO TABS
20.0000 mg | ORAL_TABLET | Freq: Once | ORAL | Status: AC
  Administered 2022-06-06: 20 mg via ORAL
  Filled 2022-06-06: qty 5

## 2022-06-06 MED ORDER — CYANOCOBALAMIN 1000 MCG/ML IJ SOLN
1000.0000 ug | Freq: Once | INTRAMUSCULAR | Status: AC
  Administered 2022-06-06: 1000 ug via INTRAMUSCULAR
  Filled 2022-06-06: qty 1

## 2022-06-06 MED ORDER — DARATUMUMAB-HYALURONIDASE-FIHJ 1800-30000 MG-UT/15ML ~~LOC~~ SOLN
1800.0000 mg | Freq: Once | SUBCUTANEOUS | Status: AC
  Administered 2022-06-06: 1800 mg via SUBCUTANEOUS
  Filled 2022-06-06: qty 15

## 2022-06-06 MED ORDER — PREGABALIN 25 MG PO CAPS: 25.0000 mg | ORAL_CAPSULE | Freq: Two times a day (BID) | ORAL | 1 refills | Status: DC

## 2022-06-06 MED ORDER — ACETAMINOPHEN 325 MG PO TABS
650.0000 mg | ORAL_TABLET | Freq: Once | ORAL | Status: AC
  Administered 2022-06-06: 650 mg via ORAL
  Filled 2022-06-06: qty 2

## 2022-06-06 MED ORDER — DIPHENHYDRAMINE HCL 25 MG PO CAPS
50.0000 mg | ORAL_CAPSULE | Freq: Once | ORAL | Status: AC
  Administered 2022-06-06: 50 mg via ORAL
  Filled 2022-06-06: qty 2

## 2022-06-06 NOTE — Progress Notes (Signed)
[place] [phone]   [phone]  PROGRESS NOTE  Patient Care Team: Burnard Bunting, MD as PCP - General (Internal Medicine)  Hematological/Oncological History # IgG Lambda Multiple Myeloma 06/04/2021: WBC 5.5, Hgb 11.1, MCV 101.3, Plt 306 07/24/2021: M protein 2.3, IFE shows IgG Lambda monoclonal specificity. Cr 0.9 08/07/2021: Establish care with Dr. Lorenso Courier 09/27/2021: Bmbx showed hypercellular bone marrow with plasma cell neoplasm, increased number of atypical plasma cells averaging 20% of all cells in the aspirate 10/18/2021: Cycle 1 of Velcade/Dexamethasone 11/08/2021: Cycle 2 of Velcade/Dexamethasone 11/29/2021: Cycle 3 of Velcade/Dexamethasone 12/20/2021: Cycle 4 of Velcade/Dexamethasone 01/10/2022: Cycle 5 of Velcade/Dexamethasone 01/31/2022: Cycle 6 of Velcade/Dexamethasone 02/28/2022: Cycle 7 of Velcade/Dexamethasone. Dose reduced velcade due to neuropathy  03/14/2022: Cycle 1 Day 1 of Dara/Dex (velcade d/c due to neuropathy).  04/11/2022: Cycle 2 Day 1 of Dara/Dex 05/09/2022: Cycle 3 Day 1 of Dara/Dex 06/06/2022: Cycle 4 Day 1 of Dara/Dex Interval History:  [name] Page 88 y.o. female with medical history significant for  IgG lambda multiple myeloma who presents for a follow up visit. The patient's last visit was on 05/23/2022. In the interim, patient has had no major changes in her health. She is due for Cycle 4, Day 1.  On exam today [name] Page reports she is tolerating treatment without any significant limitations.  Her energy is fairly stable.  She continues to ambulate with the use of her walker.  She has fallen a couple of times when she was not using her walker. She denies any dizziness or syncopal episodes.  She denies any nausea, vomiting or abdominal pain.  Her bowel habits are unchanged without any recurrent episodes of diarrhea or constipation.  She has noticed improvement of her neuropathy in her feet with taking Lyrica twice daily  as prescribed.  She is experiencing tingling and numbness in her fourth and fifth digits of her hand bilaterally.  She denies any interference with grip at this time.  She denies fevers, chills, night sweats, shortness of breath, chest pain or cough.  She has no other complaints. Rest of the 10 point ROS is below.  MEDICAL HISTORY:  Past Medical History:  Diagnosis Date   Alopecia 10/28/2016   Anginal pain (HCC)    Arthritis    Cervical spondylosis    C3-4 AND C4-5   Chest pain 07/28/2008   H/O, normal stress nuclear EF 78%   Chronic cough 07/12/2014   Collapsed lung 07/2010   being treated at [facility] - in left lung   Complication of anesthesia    Cough 07/30/2010   Qualifier: Diagnosis of  By: Chase Caller MD, Murali     Cystocele 05/23/2015   Cystocele, midline 05/02/2013   DEEP VEIN THROMBOSIS/PHLEBITIS 07/30/2010   Qualifier: History of  By: Harvest Dark CMA, Jennifer     Dislocation closed, shoulder 02/2013   DVT (deep venous thrombosis) ([place]) 2006   Encounter for therapeutic drug monitoring 10/03/2014   GERD (gastroesophageal reflux disease)    occ   Headache    hx   High cholesterol    History of recurrent UTIs 09/24/2011   HYPERLIPIDEMIA 07/30/2010   Qualifier: Diagnosis of  By: Harvest Dark CMA, Jennifer     Hypertension    HYPERTENSION 07/30/2010   Qualifier: Diagnosis of  By: Harvest Dark CMA, Jennifer     Hypothyroidism    Nasal itching 01/30/2014   Osteopenia 05/23/2015   Peripheral vascular disease ([place]) 2006   dvt's and 50 yrs ago   Pleuritic pain 12/17/2015  PONV (postoperative nausea and vomiting)    Primary localized osteoarthritis of right hip 08/26/2016   Primary osteoarthritis of right hip 08/26/2016   Pulmonary collapse 07/30/2010   Qualifier: Diagnosis of  By: Chase Caller MD, Murali     PULMONARY NODULE 07/30/2010   Qualifier: Diagnosis of  By: Chase Caller MD, Murali     Rash and nonspecific skin eruption 01/02/2015   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    Sarcoidosis    Sarcoidosis of lung ([place])    Vaginal atrophy 09/24/2011   Visual field defect nasal step 01/02/2015    SURGICAL HISTORY: Past Surgical History:  Procedure Laterality Date   BACK SURGERY  2022   EYE SURGERY     FOOT SURGERY Left 06/2005   bunion hammer toe   HEMORRHOID SURGERY  12/1972   INTRAOCULAR LENS INSERTION     right eye 10 /16 and left 06/16/2005   JOINT REPLACEMENT     KYPHOPLASTY N/A 06/27/2021   Procedure: LUMBAR 3 AND LUMBAR  5 KYPHOPLASTY;  Surgeon: Phylliss Bob, MD;  Location: [facility];  Service: Orthopedics;  Laterality: N/A;   KYPHOPLASTY N/A 09/04/2021   Procedure: THORACIC SEVEN, THORACIC EIGHT, THORACIC NINE, THORACIC TEN KYPHOPLASTY;  Surgeon: Phylliss Bob, MD;  Location: [place];  Service: Orthopedics;  Laterality: N/A;   SKIN BIOPSY  01/2007   basal cell carcinoma removed from right lower leg    TOTAL HIP ARTHROPLASTY  06/2007   left   TOTAL HIP ARTHROPLASTY Right 08/26/2016   Procedure: TOTAL HIP ARTHROPLASTY ANTERIOR APPROACH;  Surgeon: Melrose Nakayama, MD;  Location: [place];  Service: Orthopedics;  Laterality: Right;   TOTAL KNEE ARTHROPLASTY Right 10/2007   right    SOCIAL HISTORY: Social History   Socioeconomic History   Marital status: Married    Spouse name: Not on file   Number of children: Not on file   Years of education: Not on file   Highest education level: Not on file  Occupational History   Occupation: retired    Fish farm manager: RETIRED  Tobacco Use   Smoking status: Never   Smokeless tobacco: Never  Vaping Use   Vaping Use: Never used  Substance and Sexual Activity   Alcohol use: Not Currently    Comment: BEER/glass of wine A DAY   Drug use: No   Sexual activity: Never  Other Topics Concern   Not on file  Social History Narrative   Not on file   Social Determinants of Health   Financial Resource Strain: Not on file  Food Insecurity: Not on file  Transportation Needs: Not on file  Physical Activity: Not on file   Stress: Not on file  Social Connections: Not on file  Intimate Partner Violence: Not on file    FAMILY HISTORY: Family History  Problem Relation Age of Onset   Diabetes Mother    Heart failure Mother    Hypertension Father    Diabetes Father    Heart disease Father    Heart attack Father     ALLERGIES:  has No Known Allergies.  MEDICATIONS:  Current Outpatient Medications  Medication Sig Dispense Refill   acyclovir (ZOVIRAX) 400 MG tablet TAKE ONE TABLET BY MOUTH TWICE DAILY 60 tablet 2   amLODipine (NORVASC) 10 MG tablet Take 10 mg by mouth daily.     b complex vitamins capsule Take 1 capsule by mouth daily.     dicyclomine (BENTYL) 10 MG capsule Take 1 capsule (10 mg total) by mouth 3 (three) times  daily as needed for spasms. 90 capsule 0   gemfibrozil (LOPID) 600 MG tablet Take 600 mg by mouth 2 (two) times daily before a meal.     HYDROcodone-acetaminophen (NORCO/VICODIN) 5-325 MG tablet TAKE ONE TABLET EVERY 6 HOURS AS NEEDED FOR MODERATE PAIN 30 tablet 0   levothyroxine (SYNTHROID, LEVOTHROID) 25 MCG tablet Take 25 mcg by mouth daily.      LORazepam (ATIVAN) 0.5 MG tablet Take 0.5 mg by mouth daily as needed for anxiety or sleep.     losartan (COZAAR) 50 MG tablet Take 50 mg by mouth daily.     Multiple Vitamin (MULTIVITAMIN) capsule Take 1 capsule by mouth daily.     ondansetron (ZOFRAN) 8 MG tablet Take 1 tablet (8 mg total) by mouth every 8 (eight) hours as needed. 30 tablet 0   pantoprazole (PROTONIX) 20 MG tablet Take 20 mg by mouth daily.     polyethylene glycol powder (GLYCOLAX/MIRALAX) 17 GM/SCOOP powder Take 17 g by mouth at bedtime.     pregabalin (LYRICA) 25 MG capsule Take 1 capsule (25 mg total) by mouth 2 (two) times daily. 60 capsule 1   No current facility-administered medications for this visit.   Facility-Administered Medications Ordered in Other Visits  Medication Dose Route Frequency Provider Last Rate Last Admin   cyanocobalamin (VITAMIN B12)  injection 1,000 mcg  1,000 mcg Intramuscular Once Orson Slick, MD       daratumumab-hyaluronidase-fihj (DARZALEX FASPRO) 1800-30000 MG-UT/15ML chemo SQ injection 1,800 mg  1,800 mg Subcutaneous Once Orson Slick, MD        REVIEW OF SYSTEMS:   Constitutional: ( - ) fevers, ( - )  chills , ( - ) night sweats Eyes: ( - ) blurriness of vision, ( - ) double vision, ( - ) watery eyes Ears, nose, mouth, throat, and face: ( - ) mucositis, ( - ) sore throat Respiratory: ( - ) cough, ( - ) dyspnea, ( - ) wheezes Cardiovascular: ( - ) palpitation, ( - ) chest discomfort, ( - ) lower extremity swelling Gastrointestinal:  ( - ) nausea, ( - ) heartburn, ( - ) change in bowel habits Skin: ( - ) abnormal skin rashes Lymphatics: ( - ) new lymphadenopathy, ( - ) easy bruising Neurological: (+ ) numbness, ( +) tingling, ( - ) new weaknesses Behavioral/Psych: ( - ) mood change, ( - ) new changes  All other systems were reviewed with the patient and are negative.  PHYSICAL EXAMINATION: ECOG PERFORMANCE STATUS: 2 - Symptomatic, <50% confined to bed  Vitals:   06/06/22 1055  BP: 136/61  Pulse: 73  Resp: 17  Temp: 97.9 F (36.6 C)  SpO2: 100%    Filed Weights   06/06/22 1055  Weight: 110 lb 14.4 oz (50.3 kg)   GENERAL: Well-appearing elderly Caucasian female, alert, no distress and comfortable SKIN: skin color, texture, turgor are normal, no rashes or significant lesions EYES: conjunctiva are pink and non-injected, sclera clear LUNGS: clear to auscultation and percussion with normal breathing effort HEART: regular rate & rhythm and no murmurs and no lower extremity edema Musculoskeletal: no cyanosis of digits and no clubbing  PSYCH: alert & oriented x 3, fluent speech NEURO: no focal motor/sensory deficits  LABORATORY DATA:  I have reviewed the data as listed    Latest Ref Rng & Units 06/06/2022    9:49 AM 05/23/2022   11:22 AM 05/09/2022    1:05 PM  CBC  WBC 4.0 -  10.5 K/uL 3.6   3.4  5.2   Hemoglobin 12.0 - 15.0 g/dL 9.8  9.7  9.8   Hematocrit 36.0 - 46.0 % 28.3  27.7  27.8   Platelets 150 - 400 K/uL 269  322  271        Latest Ref Rng & Units 06/06/2022    9:49 AM 05/23/2022   10:32 AM 05/09/2022    1:05 PM  CMP  Glucose 70 - 99 mg/dL 88  93  111   BUN 8 - 23 mg/dL _0 Creatinine 0.44 - 1.00 mg/dL 0.66  0.67  0.74   Sodium 135 - 145 mmol/L 135  133  125   Potassium 3.5 - 5.1 mmol/L 4.2  3.8  3.9   Chloride 98 - 111 mmol/L 106  104  98   CO2 22 - 32 mmol/L _1 Calcium 8.9 - 10.3 mg/dL 8.8  8.7  8.7   Total Protein 6.5 - 8.1 g/dL 6.8  6.8  7.1   Total Bilirubin 0.3 - 1.2 mg/dL 0.3  0.3  0.3   Alkaline Phos 38 - 126 U/L 89  87  101   AST 15 - 41 U/L _2 ALT 0 - 44 U/L _3 Lab Results  Component Value Date   MPROTEIN 0.2 (H) 05/23/2022   MPROTEIN 0.2 (H) 05/09/2022   MPROTEIN 0.1 (H) 04/11/2022   Lab Results  Component Value Date   KPAFRELGTCHN 10.9 05/23/2022   KPAFRELGTCHN 9.9 05/09/2022   KPAFRELGTCHN 7.5 04/11/2022   LAMBDASER 6.2 05/23/2022   LAMBDASER 7.9 05/09/2022   LAMBDASER 4.9 (L) 04/11/2022   KAPLAMBRATIO 1.76 (H) 05/23/2022   KAPLAMBRATIO 1.25 05/09/2022   KAPLAMBRATIO 1.53 04/11/2022   RADIOGRAPHIC STUDIES: CT Head Wo Contrast  Result Date: 05/13/2022 CLINICAL DATA:  [place], posterior scalp laceration EXAM: CT HEAD WITHOUT CONTRAST TECHNIQUE: Contiguous axial images were obtained from the base of the skull through the vertex without intravenous contrast. RADIATION DOSE REDUCTION: This exam was performed according to the departmental dose-optimization program which includes automated exposure control, adjustment of the mA and/or kV according to patient size and/or use of iterative reconstruction technique. COMPARISON:  06/01/2019 FINDINGS: Brain: Stable scattered hypodensities throughout the periventricular white matter consistent with chronic small vessel ischemic change. No evidence of acute infarct  or hemorrhage. The lateral ventricles and remaining midline structures are unremarkable. No acute extra-axial fluid collections. No mass effect. Vascular: Stable atherosclerosis.  No hyperdense vessel. Skull: Normal. Negative for fracture or focal lesion. Sinuses/Orbits: Kozel thickening within the right sphenoid sinus. Remaining paranasal sinuses are clear. Other: None. IMPRESSION: 1. No acute intracranial process. 2. Right sphenoid sinus disease. Electronically Signed   By: Randa Ngo M.D.   On: 05/13/2022 21:09   DG Elbow Complete Right  Result Date: 05/13/2022 CLINICAL DATA:  Right elbow injury, fall. EXAM: RIGHT ELBOW - COMPLETE 3+ VIEW COMPARISON:  None Available. FINDINGS: There is no evidence of fracture or dislocation. Can not assess for joint effusion secondary to positioning of the lateral view. There is no evidence of arthropathy or other focal bone abnormality. Soft tissues are unremarkable. IMPRESSION: No acute displaced fracture or dislocation. Can not assess for joint effusion secondary to positioning of the lateral view. Electronically Signed   By: Ronney Asters M.D.   On: 05/13/2022 21:08    ASSESSMENT & PLAN [name] Page  88 y.o. female with medical history significant for newly diagnosed IgG lambda multiple myeloma who presents for a follow up visit.  #IgG Lambda Multiple Myeloma -- Diagnosis confirmed by bone marrow biopsy on 09/27/2021 which shows evidence of 20% involvement by plasma cells.  Additionally has anemia, macrocytosis, leukopenia, and concern for lytic lesions on bone imaging --Prognostic panel shows a deletion 17 with a TP53 mutation --Started Cycle 1 Day 1 of Velcade/Dex on 10/18/2021 --Switched to Dara/Dex on 03/14/2022 due to neuropathy secondary to Velcade.  Plan: --Due for Cycle 4 Day 1 of Dara/Dex today  --Labs from 05/23/2022 showed M protein 0.2, kappa and lambda light chains normal. Today's myeloma labs pending.  --Labs today show white blood cell count  3.6, hemoglobin 9.8, MCV 91.9, and platelets of 269, Cr 0.66 --Continue with biweekly labs, follow up visit and Dara/Dex treatments.   # H/O Confusion --decreased dexamethasone to 20 mg on infusion days.  --Symptoms resolved.   #Leukopenia/Macrocytic anemia/Vitamin B12 deficiency: --Secondary to chemotherapy and multiple myeloma --Today, hemoglobin 9.8 with MCV of 91.9 --Received vitamin B12 injection 1000 mcg weekly x 4 doses, then transition to monthly. Due for injection today.    #Right rib pain, improved: --CT chest from 10/25/2021 that didn't show any new or progressive disease.   #Abdominal cramping--resolved: --Symptoms occur after velcade injection.  --Has Bentyl as needed  #Neuropathy: --Grade 2 involving finger tips and feet --Secondary to velcade  --Currently on Lyrica 25 mg PO BID.   #Supportive Care -- chemotherapy education complete -- port placement not required.  -- zofran 55m q8H PRN and compazine 147mPO q6H for nausea -- acyclovir 40028mO BID for VCZ prophylaxis. Patient was not taking medication as prescribed. Advised importance of taking medication consistently.  -- We will start Zometa as soon as is feasible after dental clearance  No orders of the defined types were placed in this encounter.  All questions were answered. The patient knows to call the clinic with any problems, questions or concerns.  I have spent a total of 30 minutes minutes of face-to-face and non-face-to-face time, preparing to see the patient, performing a medically appropriate examination, counseling and educating the patient, ordering medications, documenting clinical information in the electronic health record,  and care coordination.   IreDede Query-C [facility]: [phone]/27/2023 12:29 PM

## 2022-06-07 ENCOUNTER — Other Ambulatory Visit: Payer: Self-pay

## 2022-06-09 LAB — KAPPA/LAMBDA LIGHT CHAINS
Kappa free light chain: 8.6 mg/L (ref 3.3–19.4)
Kappa, lambda light chain ratio: 1.16 (ref 0.26–1.65)
Lambda free light chains: 7.4 mg/L (ref 5.7–26.3)

## 2022-06-11 LAB — MULTIPLE MYELOMA PANEL, SERUM
Albumin SerPl Elph-Mcnc: 3.4 g/dL (ref 2.9–4.4)
Albumin/Glob SerPl: 1.3 (ref 0.7–1.7)
Alpha 1: 0.3 g/dL (ref 0.0–0.4)
Alpha2 Glob SerPl Elph-Mcnc: 0.8 g/dL (ref 0.4–1.0)
B-Globulin SerPl Elph-Mcnc: 1.2 g/dL (ref 0.7–1.3)
Gamma Glob SerPl Elph-Mcnc: 0.4 g/dL (ref 0.4–1.8)
Globulin, Total: 2.7 g/dL (ref 2.2–3.9)
IgA: 16 mg/dL — ABNORMAL LOW (ref 64–422)
IgG (Immunoglobin G), Serum: 760 mg/dL (ref 586–1602)
IgM (Immunoglobulin M), Srm: 20 mg/dL — ABNORMAL LOW (ref 26–217)
M Protein SerPl Elph-Mcnc: 0.2 g/dL — ABNORMAL HIGH
Total Protein ELP: 6.1 g/dL (ref 6.0–8.5)

## 2022-06-20 ENCOUNTER — Ambulatory Visit: Payer: Medicare PPO | Admitting: Hematology and Oncology

## 2022-06-20 ENCOUNTER — Other Ambulatory Visit: Payer: Medicare PPO

## 2022-06-20 ENCOUNTER — Inpatient Hospital Stay: Payer: Medicare PPO | Attending: Hematology and Oncology

## 2022-06-20 ENCOUNTER — Inpatient Hospital Stay: Payer: Medicare PPO | Admitting: Physician Assistant

## 2022-06-20 ENCOUNTER — Ambulatory Visit: Payer: Medicare PPO

## 2022-06-20 ENCOUNTER — Inpatient Hospital Stay: Payer: Medicare PPO

## 2022-06-20 VITALS — BP 149/64 | HR 65 | Temp 97.7°F | Resp 17

## 2022-06-20 DIAGNOSIS — G620 Drug-induced polyneuropathy: Secondary | ICD-10-CM | POA: Diagnosis not present

## 2022-06-20 DIAGNOSIS — Z5112 Encounter for antineoplastic immunotherapy: Secondary | ICD-10-CM | POA: Insufficient documentation

## 2022-06-20 DIAGNOSIS — E538 Deficiency of other specified B group vitamins: Secondary | ICD-10-CM | POA: Diagnosis present

## 2022-06-20 DIAGNOSIS — C9000 Multiple myeloma not having achieved remission: Secondary | ICD-10-CM

## 2022-06-20 DIAGNOSIS — T451X5D Adverse effect of antineoplastic and immunosuppressive drugs, subsequent encounter: Secondary | ICD-10-CM | POA: Insufficient documentation

## 2022-06-20 DIAGNOSIS — Z800 Family history of malignant neoplasm of digestive organs: Secondary | ICD-10-CM | POA: Insufficient documentation

## 2022-06-20 DIAGNOSIS — Z79899 Other long term (current) drug therapy: Secondary | ICD-10-CM | POA: Insufficient documentation

## 2022-06-20 LAB — CBC WITH DIFFERENTIAL (CANCER CENTER ONLY)
Abs Immature Granulocytes: 0.01 10*3/uL (ref 0.00–0.07)
Basophils Absolute: 0 10*3/uL (ref 0.0–0.1)
Basophils Relative: 0 %
Eosinophils Absolute: 0.1 10*3/uL (ref 0.0–0.5)
Eosinophils Relative: 3 %
HCT: 28.5 % — ABNORMAL LOW (ref 36.0–46.0)
Hemoglobin: 9.9 g/dL — ABNORMAL LOW (ref 12.0–15.0)
Immature Granulocytes: 0 %
Lymphocytes Relative: 28 %
Lymphs Abs: 1.3 10*3/uL (ref 0.7–4.0)
MCH: 31.9 pg (ref 26.0–34.0)
MCHC: 34.7 g/dL (ref 30.0–36.0)
MCV: 91.9 fL (ref 80.0–100.0)
Monocytes Absolute: 0.5 10*3/uL (ref 0.1–1.0)
Monocytes Relative: 11 %
Neutro Abs: 2.6 10*3/uL (ref 1.7–7.7)
Neutrophils Relative %: 58 %
Platelet Count: 196 10*3/uL (ref 150–400)
RBC: 3.1 MIL/uL — ABNORMAL LOW (ref 3.87–5.11)
RDW: 14.3 % (ref 11.5–15.5)
WBC Count: 4.6 10*3/uL (ref 4.0–10.5)
nRBC: 0 % (ref 0.0–0.2)

## 2022-06-20 LAB — CMP (CANCER CENTER ONLY)
ALT: 7 U/L (ref 0–44)
AST: 18 U/L (ref 15–41)
Albumin: 4.1 g/dL (ref 3.5–5.0)
Alkaline Phosphatase: 97 U/L (ref 38–126)
Anion gap: 7 (ref 5–15)
BUN: 18 mg/dL (ref 8–23)
CO2: 23 mmol/L (ref 22–32)
Calcium: 9 mg/dL (ref 8.9–10.3)
Chloride: 104 mmol/L (ref 98–111)
Creatinine: 0.76 mg/dL (ref 0.44–1.00)
GFR, Estimated: >60 mL/min (ref >60)
Glucose, Bld: 97 mg/dL (ref 70–99)
Potassium: 4.3 mmol/L (ref 3.5–5.1)
Sodium: 134 mmol/L — ABNORMAL LOW (ref 135–145)
Total Bilirubin: 0.3 mg/dL (ref 0.3–1.2)
Total Protein: 7.2 g/dL (ref 6.5–8.1)

## 2022-06-20 LAB — LACTATE DEHYDROGENASE: LDH: 143 U/L (ref 98–192)

## 2022-06-20 MED ORDER — DEXAMETHASONE 4 MG PO TABS
20.0000 mg | ORAL_TABLET | Freq: Once | ORAL | Status: AC
  Administered 2022-06-20: 20 mg via ORAL
  Filled 2022-06-20: qty 5

## 2022-06-20 MED ORDER — HYDROCODONE-ACETAMINOPHEN 5-325 MG PO TABS: ORAL_TABLET | ORAL | 0 refills | Status: DC

## 2022-06-20 MED ORDER — DIPHENHYDRAMINE HCL 25 MG PO CAPS
50.0000 mg | ORAL_CAPSULE | Freq: Once | ORAL | Status: AC
  Administered 2022-06-20: 50 mg via ORAL
  Filled 2022-06-20: qty 2

## 2022-06-20 MED ORDER — ACETAMINOPHEN 325 MG PO TABS
650.0000 mg | ORAL_TABLET | Freq: Once | ORAL | Status: AC
  Administered 2022-06-20: 650 mg via ORAL
  Filled 2022-06-20: qty 2

## 2022-06-20 MED ORDER — DARATUMUMAB-HYALURONIDASE-FIHJ 1800-30000 MG-UT/15ML ~~LOC~~ SOLN
1800.0000 mg | Freq: Once | SUBCUTANEOUS | Status: AC
  Administered 2022-06-20: 1800 mg via SUBCUTANEOUS
  Filled 2022-06-20: qty 15

## 2022-06-20 NOTE — Progress Notes (Signed)
[place] [phone]   [phone]  PROGRESS NOTE  Patient Care Team: Burnard Bunting, MD as PCP - General (Internal Medicine)  Hematological/Oncological History # IgG Lambda Multiple Myeloma 06/04/2021: WBC 5.5, Hgb 11.1, MCV 101.3, Plt 306 07/24/2021: M protein 2.3, IFE shows IgG Lambda monoclonal specificity. Cr 0.9 08/07/2021: Establish care with Dr. Lorenso Courier 09/27/2021: Bmbx showed hypercellular bone marrow with plasma cell neoplasm, increased number of atypical plasma cells averaging 20% of all cells in the aspirate 10/18/2021: Cycle 1 of Velcade/Dexamethasone 11/08/2021: Cycle 2 of Velcade/Dexamethasone 11/29/2021: Cycle 3 of Velcade/Dexamethasone 12/20/2021: Cycle 4 of Velcade/Dexamethasone 01/10/2022: Cycle 5 of Velcade/Dexamethasone 01/31/2022: Cycle 6 of Velcade/Dexamethasone 02/28/2022: Cycle 7 of Velcade/Dexamethasone. Dose reduced velcade due to neuropathy  03/14/2022: Cycle 1 Day 1 of Dara/Dex (velcade d/c due to neuropathy).  04/11/2022: Cycle 2 Day 1 of Dara/Dex 05/09/2022: Cycle 3 Day 1 of Dara/Dex 06/06/2022: Cycle 4 Day 1 of Dara/Dex Interval History:  [name] Page 88 y.o. female with medical history significant for  IgG lambda multiple myeloma who presents for a follow up visit. The patient's last visit was on 06/06/2022. In the interim, patient has had no major changes in her health. She is due for Cycle 4, Day 15.  On exam today [name] Page reports her energy and appetite are stable. She continues to use her walker for ambulation. She denies any nausea, vomiting or abdominal pain. Bowel habits are unchanged without any recurrent episodes of diarrhea or constipation. Her neuropathy in her feet are stable and she is compliant with taking Lyrica twice daily as prescribed.  She denies fevers, chills, night sweats, shortness of breath, chest pain or cough.  She has no other complaints. Rest of the 10 point ROS is below.  MEDICAL HISTORY:   Past Medical History:  Diagnosis Date   Alopecia 10/28/2016   Anginal pain (HCC)    Arthritis    Cervical spondylosis    C3-4 AND C4-5   Chest pain 07/28/2008   H/O, normal stress nuclear EF 78%   Chronic cough 07/12/2014   Collapsed lung 07/2010   being treated at [facility] - in left lung   Complication of anesthesia    Cough 07/30/2010   Qualifier: Diagnosis of  By: Chase Caller MD, Murali     Cystocele 05/23/2015   Cystocele, midline 05/02/2013   DEEP VEIN THROMBOSIS/PHLEBITIS 07/30/2010   Qualifier: History of  By: Harvest Dark CMA, Jennifer     Dislocation closed, shoulder 02/2013   DVT (deep venous thrombosis) ([place]) 2006   Encounter for therapeutic drug monitoring 10/03/2014   GERD (gastroesophageal reflux disease)    occ   Headache    hx   High cholesterol    History of recurrent UTIs 09/24/2011   HYPERLIPIDEMIA 07/30/2010   Qualifier: Diagnosis of  By: Harvest Dark CMA, Jennifer     Hypertension    HYPERTENSION 07/30/2010   Qualifier: Diagnosis of  By: Harvest Dark CMA, Jennifer     Hypothyroidism    Nasal itching 01/30/2014   Osteopenia 05/23/2015   Peripheral vascular disease ([place]) 2006   dvt's and 50 yrs ago   Pleuritic pain 12/17/2015   PONV (postoperative nausea and vomiting)    Primary localized osteoarthritis of right hip 08/26/2016   Primary osteoarthritis of right hip 08/26/2016   Pulmonary collapse 07/30/2010   Qualifier: Diagnosis of  By: Chase Caller MD, Murali     PULMONARY NODULE 07/30/2010   Qualifier: Diagnosis of  By: Chase Caller MD, Murali     Rash and  nonspecific skin eruption 01/02/2015   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   Sarcoidosis    Sarcoidosis of lung ([place])    Vaginal atrophy 09/24/2011   Visual field defect nasal step 01/02/2015    SURGICAL HISTORY: Past Surgical History:  Procedure Laterality Date   BACK SURGERY  2022   EYE SURGERY     FOOT SURGERY Left 06/2005   bunion hammer toe   HEMORRHOID SURGERY  12/1972   INTRAOCULAR LENS  INSERTION     right eye 10 /16 and left 06/16/2005   JOINT REPLACEMENT     KYPHOPLASTY N/A 06/27/2021   Procedure: LUMBAR 3 AND LUMBAR  5 KYPHOPLASTY;  Surgeon: Phylliss Bob, MD;  Location: [place];  Service: Orthopedics;  Laterality: N/A;   KYPHOPLASTY N/A 09/04/2021   Procedure: THORACIC SEVEN, THORACIC EIGHT, THORACIC NINE, THORACIC TEN KYPHOPLASTY;  Surgeon: Phylliss Bob, MD;  Location: [place];  Service: Orthopedics;  Laterality: N/A;   SKIN BIOPSY  01/2007   basal cell carcinoma removed from right lower leg    TOTAL HIP ARTHROPLASTY  06/2007   left   TOTAL HIP ARTHROPLASTY Right 08/26/2016   Procedure: TOTAL HIP ARTHROPLASTY ANTERIOR APPROACH;  Surgeon: Melrose Nakayama, MD;  Location: [place];  Service: Orthopedics;  Laterality: Right;   TOTAL KNEE ARTHROPLASTY Right 10/2007   right    SOCIAL HISTORY: Social History   Socioeconomic History   Marital status: Married    Spouse name: Not on file   Number of children: Not on file   Years of education: Not on file   Highest education level: Not on file  Occupational History   Occupation: retired    Fish farm manager: RETIRED  Tobacco Use   Smoking status: Never   Smokeless tobacco: Never  Vaping Use   Vaping Use: Never used  Substance and Sexual Activity   Alcohol use: Not Currently    Comment: BEER/glass of wine A DAY   Drug use: No   Sexual activity: Never  Other Topics Concern   Not on file  Social History Narrative   Not on file   Social Determinants of Health   Financial Resource Strain: Not on file  Food Insecurity: Not on file  Transportation Needs: Not on file  Physical Activity: Not on file  Stress: Not on file  Social Connections: Not on file  Intimate Partner Violence: Not on file    FAMILY HISTORY: Family History  Problem Relation Age of Onset   Diabetes Mother    Heart failure Mother    Hypertension Father    Diabetes Father    Heart disease Father    Heart attack Father     ALLERGIES:  has No Known  Allergies.  MEDICATIONS:  Current Outpatient Medications  Medication Sig Dispense Refill   acyclovir (ZOVIRAX) 400 MG tablet TAKE ONE TABLET BY MOUTH TWICE DAILY 60 tablet 2   amLODipine (NORVASC) 10 MG tablet Take 10 mg by mouth daily.     b complex vitamins capsule Take 1 capsule by mouth daily.     dicyclomine (BENTYL) 10 MG capsule Take 1 capsule (10 mg total) by mouth 3 (three) times daily as needed for spasms. 90 capsule 0   gemfibrozil (LOPID) 600 MG tablet Take 600 mg by mouth 2 (two) times daily before a meal.     levothyroxine (SYNTHROID) 50 MCG tablet Take 25 mcg by mouth daily.     LORazepam (ATIVAN) 0.5 MG tablet Take 0.5 mg by mouth daily as needed for anxiety  or sleep.     losartan (COZAAR) 50 MG tablet Take 50 mg by mouth daily.     Multiple Vitamin (MULTIVITAMIN) capsule Take 1 capsule by mouth daily.     ondansetron (ZOFRAN) 8 MG tablet Take 1 tablet (8 mg total) by mouth every 8 (eight) hours as needed. 30 tablet 0   pantoprazole (PROTONIX) 20 MG tablet Take 20 mg by mouth daily.     polyethylene glycol powder (GLYCOLAX/MIRALAX) 17 GM/SCOOP powder Take 17 g by mouth at bedtime.     pregabalin (LYRICA) 25 MG capsule Take 1 capsule (25 mg total) by mouth 2 (two) times daily. 60 capsule 1   HYDROcodone-acetaminophen (NORCO/VICODIN) 5-325 MG tablet TAKE ONE TABLET EVERY 6 HOURS AS NEEDED FOR MODERATE PAIN 30 tablet 0   No current facility-administered medications for this visit.    REVIEW OF SYSTEMS:   Constitutional: ( - ) fevers, ( - )  chills , ( - ) night sweats Eyes: ( - ) blurriness of vision, ( - ) double vision, ( - ) watery eyes Ears, nose, mouth, throat, and face: ( - ) mucositis, ( - ) sore throat Respiratory: ( - ) cough, ( - ) dyspnea, ( - ) wheezes Cardiovascular: ( - ) palpitation, ( - ) chest discomfort, ( - ) lower extremity swelling Gastrointestinal:  ( - ) nausea, ( - ) heartburn, ( - ) change in bowel habits Skin: ( - ) abnormal skin  rashes Lymphatics: ( - ) new lymphadenopathy, ( - ) easy bruising Neurological: (+ ) numbness, ( +) tingling, ( - ) new weaknesses Behavioral/Psych: ( - ) mood change, ( - ) new changes  All other systems were reviewed with the patient and are negative.  PHYSICAL EXAMINATION: ECOG PERFORMANCE STATUS: 2 - Symptomatic, <50% confined to bed  Vitals:   06/20/22 1256  BP: (!) 141/70  Pulse: 76  Resp: 15  Temp: 97.6 F (36.4 C)  SpO2: 99%    Filed Weights   06/20/22 1256  Weight: 113 lb 14.4 oz (51.7 kg)   GENERAL: Well-appearing elderly Caucasian female, alert, no distress and comfortable SKIN: skin color, texture, turgor are normal, no rashes or significant lesions EYES: conjunctiva are pink and non-injected, sclera clear LUNGS: clear to auscultation and percussion with normal breathing effort HEART: regular rate & rhythm and no murmurs and no lower extremity edema Musculoskeletal: no cyanosis of digits and no clubbing  PSYCH: alert & oriented x 3, fluent speech NEURO: no focal motor/sensory deficits  LABORATORY DATA:  I have reviewed the data as listed    Latest Ref Rng & Units 06/20/2022   12:37 PM 06/06/2022    9:49 AM 05/23/2022   11:22 AM  CBC  WBC 4.0 - 10.5 K/uL 4.6  3.6  3.4   Hemoglobin 12.0 - 15.0 g/dL 9.9  9.8  9.7   Hematocrit 36.0 - 46.0 % 28.5  28.3  27.7   Platelets 150 - 400 K/uL 196  269  322        Latest Ref Rng & Units 06/20/2022   12:37 PM 06/06/2022    9:49 AM 05/23/2022   10:32 AM  CMP  Glucose 70 - 99 mg/dL 97  88  93   BUN 8 - 23 mg/dL _0 Creatinine 0.44 - 1.00 mg/dL 0.76  0.66  0.67   Sodium 135 - 145 mmol/L 134  135  133   Potassium 3.5 - 5.1 mmol/L 4.3  4.2  3.8   Chloride 98 - 111 mmol/L 104  106  104   CO2 22 - 32 mmol/L _0 Calcium 8.9 - 10.3 mg/dL 9.0  8.8  8.7   Total Protein 6.5 - 8.1 g/dL 7.2  6.8  6.8   Total Bilirubin 0.3 - 1.2 mg/dL 0.3  0.3  0.3   Alkaline Phos 38 - 126 U/L 97  89  87   AST 15 - 41  U/L _1 ALT 0 - 44 U/L _2 Lab Results  Component Value Date   MPROTEIN 0.2 (H) 06/06/2022   MPROTEIN 0.2 (H) 05/23/2022   MPROTEIN 0.2 (H) 05/09/2022   Lab Results  Component Value Date   KPAFRELGTCHN 8.6 06/06/2022   KPAFRELGTCHN 10.9 05/23/2022   KPAFRELGTCHN 9.9 05/09/2022   LAMBDASER 7.4 06/06/2022   LAMBDASER 6.2 05/23/2022   LAMBDASER 7.9 05/09/2022   KAPLAMBRATIO 1.16 06/06/2022   KAPLAMBRATIO 1.76 (H) 05/23/2022   KAPLAMBRATIO 1.25 05/09/2022   RADIOGRAPHIC STUDIES: No results found.  ASSESSMENT & PLAN [name] Page 88 y.o. female with medical history significant for newly diagnosed IgG lambda multiple myeloma who presents for a follow up visit.  #IgG Lambda Multiple Myeloma -- Diagnosis confirmed by bone marrow biopsy on 09/27/2021 which shows evidence of 20% involvement by plasma cells.  Additionally has anemia, macrocytosis, leukopenia, and concern for lytic lesions on bone imaging --Prognostic panel shows a deletion 17 with a TP53 mutation --Started Cycle 1 Day 1 of Velcade/Dex on 10/18/2021 --Switched to Dara/Dex on 03/14/2022 due to neuropathy secondary to Velcade.  Plan: --Due for Cycle 4 Day 15 of Dara/Dex today  --Labs from 06/06/2022 showed M protein 0.2, kappa and lambda light chains normal. Today's myeloma labs pending.  --Labs today show white blood cell count 4.6, hemoglobin 9.9, MCV 91.9, and platelets of 196. Cr 0.76 --Continue with biweekly labs, follow up visit and Dara/Dex treatments.   # H/O Confusion --decreased dexamethasone to 20 mg on infusion days.  --Symptoms resolved.   #Leukopenia/Macrocytic anemia/Vitamin B12 deficiency: --Secondary to chemotherapy and multiple myeloma --Today, hemoglobin 9.9 with MCV of 91.9 --Received vitamin B12 injection 1000 mcg weekly x 4 doses. Now transition to monthly.   #Right rib pain, improved: --CT chest from 10/25/2021 that didn't show any new or progressive disease.   #Abdominal  cramping--resolved: --Symptoms occur after velcade injection.  --Has Bentyl as needed  #Neuropathy: --Grade 2 involving finger tips and feet --Secondary to velcade  --Currently on Lyrica 25 mg PO BID.   #Supportive Care -- chemotherapy education complete -- port placement not required.  -- zofran 65m q8H PRN and compazine 191mPO q6H for nausea -- acyclovir 40051mO BID for VCZ prophylaxis. Patient was not taking medication as prescribed. Advised importance of taking medication consistently.  -- We will start Zometa as soon as is feasible after dental clearance  No orders of the defined types were placed in this encounter.  All questions were answered. The patient knows to call the clinic with any problems, questions or concerns.  I have spent a total of 30 minutes minutes of face-to-face and non-face-to-face time, preparing to see the patient, performing a medically appropriate examination, counseling and educating the patient, ordering medications, documenting clinical information in the electronic health record,  and care coordination.   IreDede Query-C [facility]  Phone: [phone]   06/20/2022 1:17 PM

## 2022-06-20 NOTE — Patient Instructions (Signed)
Daratumumab; Hyaluronidase Injection What is this medication? DARATUMUMAB; HYALURONIDASE (dar a toom ue mab; hye al ur ON i dase) treats multiple myeloma, a type of bone marrow cancer. Daratumumab works by blocking a protein that causes cancer cells to grow and multiply. This helps to slow or stop the spread of cancer cells. Hyaluronidase works by increasing the absorption of other medications in the body to help them work better. This medication may also be used treat amyloidosis, a condition that causes the buildup of a protein (amyloid) in your body. It works by reducing the buildup of this protein, which decreases symptoms. It is a combination medication that contains a monoclonal antibody. This medicine may be used for other purposes; ask your health care provider or pharmacist if you have questions. COMMON BRAND NAME(S): DARZALEX FASPRO What should I tell my care team before I take this medication? They need to know if you have any of these conditions: Heart disease Infection, such as chickenpox, cold sores, herpes, hepatitis B Lung or breathing disease An unusual or allergic reaction to daratumumab, hyaluronidase, other medications, foods, dyes, or preservatives Pregnant or trying to get pregnant Breast-feeding How should I use this medication? This medication is injected under the skin. It is given by your care team in a hospital or clinic setting. Talk to your care team about the use of this medication in children. Special care may be needed. Overdosage: If you think you have taken too much of this medicine contact a poison control center or emergency room at once. NOTE: This medicine is only for you. Do not share this medicine with others. What if I miss a dose? Keep appointments for follow-up doses. It is important not to miss your dose. Call your care team if you are unable to keep an appointment. What may interact with this medication? Interactions have not been studied. This list  may not describe all possible interactions. Give your health care provider a list of all the medicines, herbs, non-prescription drugs, or dietary supplements you use. Also tell them if you smoke, drink alcohol, or use illegal drugs. Some items may interact with your medicine. What should I watch for while using this medication? Your condition will be monitored carefully while you are receiving this medication. This medication can cause serious allergic reactions. To reduce your risk, your care team may give you other medication to take before receiving this one. Be sure to follow the directions from your care team. This medication can affect the results of blood tests to match your blood type. These changes can last for up to 6 months after the final dose. Your care team will do blood tests to match your blood type before you start treatment. Tell all of your care team that you are being treated with this medication before receiving a blood transfusion. This medication can affect the results of some tests used to determine treatment response; extra tests may be needed to evaluate response. Talk to your care team if you wish to become pregnant or think you are pregnant. This medication can cause serious birth defects if taken during pregnancy and for 3 months after the last dose. A reliable form of contraception is recommended while taking this medication and for 3 months after the last dose. Talk to your care team about effective forms of contraception. Do not breast-feed while taking this medication. What side effects may I notice from receiving this medication? Side effects that you should report to your care team as soon as   possible: Allergic reactions--skin rash, itching, hives, swelling of the face, lips, tongue, or throat Heart rhythm changes--fast or irregular heartbeat, dizziness, feeling faint or lightheaded, chest pain, trouble breathing Infection--fever, chills, cough, sore throat, wounds that  don't heal, pain or trouble when passing urine, general feeling of discomfort or being unwell Infusion reactions--chest pain, shortness of breath or trouble breathing, feeling faint or lightheaded Sudden eye pain or change in vision such as blurry vision, seeing halos around lights, vision loss Unusual bruising or bleeding Side effects that usually do not require medical attention (report to your care team if they continue or are bothersome): Constipation Diarrhea Fatigue Nausea Pain, tingling, or numbness in the hands or feet Swelling of the ankles, hands, or feet This list may not describe all possible side effects. Call your doctor for medical advice about side effects. You may report side effects to FDA at 1-800-FDA-1088. Where should I keep my medication? This medication is given in a hospital or clinic. It will not be stored at home. NOTE: This sheet is a summary. It may not cover all possible information. If you have questions about this medicine, talk to your doctor, pharmacist, or health care provider.  2023 Elsevier/Gold Standard (2021-11-20 00:00:00)  

## 2022-06-21 LAB — BETA 2 MICROGLOBULIN, SERUM: Beta-2 Microglobulin: 2.7 mg/L — ABNORMAL HIGH (ref 0.6–2.4)

## 2022-06-23 LAB — KAPPA/LAMBDA LIGHT CHAINS
Kappa free light chain: 9.4 mg/L (ref 3.3–19.4)
Kappa, lambda light chain ratio: 1.27 (ref 0.26–1.65)
Lambda free light chains: 7.4 mg/L (ref 5.7–26.3)

## 2022-06-24 LAB — MULTIPLE MYELOMA PANEL, SERUM
Albumin SerPl Elph-Mcnc: 3.5 g/dL (ref 2.9–4.4)
Albumin/Glob SerPl: 1.3 (ref 0.7–1.7)
Alpha 1: 0.3 g/dL (ref 0.0–0.4)
Alpha2 Glob SerPl Elph-Mcnc: 0.9 g/dL (ref 0.4–1.0)
B-Globulin SerPl Elph-Mcnc: 1.3 g/dL (ref 0.7–1.3)
Gamma Glob SerPl Elph-Mcnc: 0.4 g/dL (ref 0.4–1.8)
Globulin, Total: 2.9 g/dL (ref 2.2–3.9)
IgA: 13 mg/dL — ABNORMAL LOW (ref 64–422)
IgG (Immunoglobin G), Serum: 711 mg/dL (ref 586–1602)
IgM (Immunoglobulin M), Srm: 19 mg/dL — ABNORMAL LOW (ref 26–217)
M Protein SerPl Elph-Mcnc: 0.2 g/dL — ABNORMAL HIGH
Total Protein ELP: 6.4 g/dL (ref 6.0–8.5)

## 2022-07-04 ENCOUNTER — Other Ambulatory Visit: Payer: Self-pay

## 2022-07-04 ENCOUNTER — Inpatient Hospital Stay: Payer: Medicare PPO | Admitting: Hematology and Oncology

## 2022-07-04 ENCOUNTER — Inpatient Hospital Stay: Payer: Medicare PPO

## 2022-07-04 VITALS — BP 146/70 | HR 80 | Temp 97.0°F | Resp 16 | Wt 111.3 lb

## 2022-07-04 DIAGNOSIS — C9000 Multiple myeloma not having achieved remission: Secondary | ICD-10-CM

## 2022-07-04 DIAGNOSIS — Z5112 Encounter for antineoplastic immunotherapy: Secondary | ICD-10-CM | POA: Diagnosis not present

## 2022-07-04 LAB — CBC WITH DIFFERENTIAL (CANCER CENTER ONLY)
Abs Immature Granulocytes: 0.02 10*3/uL (ref 0.00–0.07)
Basophils Absolute: 0 10*3/uL (ref 0.0–0.1)
Basophils Relative: 0 %
Eosinophils Absolute: 0.1 10*3/uL (ref 0.0–0.5)
Eosinophils Relative: 1 %
HCT: 29.5 % — ABNORMAL LOW (ref 36.0–46.0)
Hemoglobin: 10.3 g/dL — ABNORMAL LOW (ref 12.0–15.0)
Immature Granulocytes: 0 %
Lymphocytes Relative: 18 %
Lymphs Abs: 1.3 10*3/uL (ref 0.7–4.0)
MCH: 32.1 pg (ref 26.0–34.0)
MCHC: 34.9 g/dL (ref 30.0–36.0)
MCV: 91.9 fL (ref 80.0–100.0)
Monocytes Absolute: 0.7 10*3/uL (ref 0.1–1.0)
Monocytes Relative: 9 %
Neutro Abs: 5 10*3/uL (ref 1.7–7.7)
Neutrophils Relative %: 72 %
Platelet Count: 277 10*3/uL (ref 150–400)
RBC: 3.21 MIL/uL — ABNORMAL LOW (ref 3.87–5.11)
RDW: 14.5 % (ref 11.5–15.5)
WBC Count: 7.1 10*3/uL (ref 4.0–10.5)
nRBC: 0 % (ref 0.0–0.2)

## 2022-07-04 LAB — CMP (CANCER CENTER ONLY)
ALT: 8 U/L (ref 0–44)
AST: 19 U/L (ref 15–41)
Albumin: 4.3 g/dL (ref 3.5–5.0)
Alkaline Phosphatase: 87 U/L (ref 38–126)
Anion gap: 7 (ref 5–15)
BUN: 16 mg/dL (ref 8–23)
CO2: 24 mmol/L (ref 22–32)
Calcium: 9.5 mg/dL (ref 8.9–10.3)
Chloride: 103 mmol/L (ref 98–111)
Creatinine: 0.83 mg/dL (ref 0.44–1.00)
GFR, Estimated: >60 mL/min
Glucose, Bld: 86 mg/dL (ref 70–99)
Potassium: 3.9 mmol/L (ref 3.5–5.1)
Sodium: 134 mmol/L — ABNORMAL LOW (ref 135–145)
Total Bilirubin: 0.4 mg/dL (ref 0.3–1.2)
Total Protein: 7 g/dL (ref 6.5–8.1)

## 2022-07-04 LAB — LACTATE DEHYDROGENASE: LDH: 148 U/L (ref 98–192)

## 2022-07-04 MED ORDER — DEXAMETHASONE 4 MG PO TABS
20.0000 mg | ORAL_TABLET | Freq: Once | ORAL | Status: AC
  Administered 2022-07-04: 20 mg via ORAL
  Filled 2022-07-04: qty 5

## 2022-07-04 MED ORDER — DIPHENHYDRAMINE HCL 25 MG PO CAPS
25.0000 mg | ORAL_CAPSULE | Freq: Once | ORAL | Status: AC
  Administered 2022-07-04: 25 mg via ORAL
  Filled 2022-07-04: qty 1

## 2022-07-04 MED ORDER — ACETAMINOPHEN 325 MG PO TABS
650.0000 mg | ORAL_TABLET | Freq: Once | ORAL | Status: AC
  Administered 2022-07-04: 650 mg via ORAL
  Filled 2022-07-04: qty 2

## 2022-07-04 MED ORDER — DARATUMUMAB-HYALURONIDASE-FIHJ 1800-30000 MG-UT/15ML ~~LOC~~ SOLN
1800.0000 mg | Freq: Once | SUBCUTANEOUS | Status: AC
  Administered 2022-07-04: 1800 mg via SUBCUTANEOUS
  Filled 2022-07-04: qty 15

## 2022-07-04 NOTE — Progress Notes (Signed)
[place] [phone]   [phone]  PROGRESS NOTE  Patient Care Team: Burnard Bunting, MD as PCP - General (Internal Medicine)  Hematological/Oncological History # IgG Lambda Multiple Myeloma 06/04/2021: WBC 5.5, Hgb 11.1, MCV 101.3, Plt 306 07/24/2021: M protein 2.3, IFE shows IgG Lambda monoclonal specificity. Cr 0.9 08/07/2021: Establish care with Dr. Lorenso Courier 09/27/2021: Bmbx showed hypercellular bone marrow with plasma cell neoplasm, increased number of atypical plasma cells averaging 20% of all cells in the aspirate 10/18/2021: Cycle 1 of Velcade/Dexamethasone 11/08/2021: Cycle 2 of Velcade/Dexamethasone 11/29/2021: Cycle 3 of Velcade/Dexamethasone 12/20/2021: Cycle 4 of Velcade/Dexamethasone 01/10/2022: Cycle 5 of Velcade/Dexamethasone 01/31/2022: Cycle 6 of Velcade/Dexamethasone 02/28/2022: Cycle 7 of Velcade/Dexamethasone. Dose reduced velcade due to neuropathy  03/14/2022: Cycle 1 Day 1 of Dara/Dex (velcade d/c due to neuropathy).  04/11/2022: Cycle 2 Day 1 of Dara/Dex 05/09/2022: Cycle 3 Day 1 of Dara/Dex 06/06/2022: Cycle 4 Day 1 of Dara/Dex 07/04/2022: Cycle 5 Day 1 of Dara/Dex  Interval History:  [name] Page Marrow 88 y.o. female with medical history significant for  IgG lambda multiple myeloma who presents for a follow up visit. The patient's last visit was on 06/20/2022. In the interim, patient has had no major changes in her health. She is due for Cycle 5, Day 1.  On exam today [name] Page reports she and her husband had a low-key Thanksgiving.  She has developed a cyst on her left shoulder.  It appears most consistent with a ganglion cyst.  It appears fluid-filled and is not firm or attached to the bone as a plasmacytoma would be.  She notes that it is causing her some mild discomfort but is not acutely painful.  She notes that she continues to have neuropathy affecting her hands bilaterally.  Overall she is feeling better.  She is tolerating the  Darzalex shots without any difficulty.  She denies fevers, chills, night sweats, shortness of breath, chest pain or cough.  She has no other complaints. Rest of the 10 point ROS is below.  MEDICAL HISTORY:  Past Medical History:  Diagnosis Date   Alopecia 10/28/2016   Anginal pain (HCC)    Arthritis    Cervical spondylosis    C3-4 AND C4-5   Chest pain 07/28/2008   H/O, normal stress nuclear EF 78%   Chronic cough 07/12/2014   Collapsed lung 07/2010   being treated at [facility] - in left lung   Complication of anesthesia    Cough 07/30/2010   Qualifier: Diagnosis of  By: Chase Caller MD, Murali     Cystocele 05/23/2015   Cystocele, midline 05/02/2013   DEEP VEIN THROMBOSIS/PHLEBITIS 07/30/2010   Qualifier: History of  By: Harvest Dark CMA, Jennifer     Dislocation closed, shoulder 02/2013   DVT (deep venous thrombosis) ([place]) 2006   Encounter for therapeutic drug monitoring 10/03/2014   GERD (gastroesophageal reflux disease)    occ   Headache    hx   High cholesterol    History of recurrent UTIs 09/24/2011   HYPERLIPIDEMIA 07/30/2010   Qualifier: Diagnosis of  By: Harvest Dark CMA, Jennifer     Hypertension    HYPERTENSION 07/30/2010   Qualifier: Diagnosis of  By: Harvest Dark CMA, Jennifer     Hypothyroidism    Nasal itching 01/30/2014   Osteopenia 05/23/2015   Peripheral vascular disease ([place]) 2006   dvt's and 50 yrs ago   Pleuritic pain 12/17/2015   PONV (postoperative nausea and vomiting)    Primary localized osteoarthritis of right hip 08/26/2016  Primary osteoarthritis of right hip 08/26/2016   Pulmonary collapse 07/30/2010   Qualifier: Diagnosis of  By: Chase Caller MD, Murali     PULMONARY NODULE 07/30/2010   Qualifier: Diagnosis of  By: Chase Caller MD, Murali     Rash and nonspecific skin eruption 01/02/2015   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   Sarcoidosis    Sarcoidosis of lung ([place])    Vaginal atrophy 09/24/2011   Visual field defect nasal step 01/02/2015    SURGICAL  HISTORY: Past Surgical History:  Procedure Laterality Date   BACK SURGERY  2022   EYE SURGERY     FOOT SURGERY Left 06/2005   bunion hammer toe   HEMORRHOID SURGERY  12/1972   INTRAOCULAR LENS INSERTION     right eye 10 /16 and left 06/16/2005   JOINT REPLACEMENT     KYPHOPLASTY N/A 06/27/2021   Procedure: LUMBAR 3 AND LUMBAR  5 KYPHOPLASTY;  Surgeon: Phylliss Bob, MD;  Location: [facility];  Service: Orthopedics;  Laterality: N/A;   KYPHOPLASTY N/A 09/04/2021   Procedure: THORACIC SEVEN, THORACIC EIGHT, THORACIC NINE, THORACIC TEN KYPHOPLASTY;  Surgeon: Phylliss Bob, MD;  Location: [facility];  Service: Orthopedics;  Laterality: N/A;   SKIN BIOPSY  01/2007   basal cell carcinoma removed from right lower leg    TOTAL HIP ARTHROPLASTY  06/2007   left   TOTAL HIP ARTHROPLASTY Right 08/26/2016   Procedure: TOTAL HIP ARTHROPLASTY ANTERIOR APPROACH;  Surgeon: Melrose Nakayama, MD;  Location: [facility];  Service: Orthopedics;  Laterality: Right;   TOTAL KNEE ARTHROPLASTY Right 10/2007   right    SOCIAL HISTORY: Social History   Socioeconomic History   Marital status: Married    Spouse name: Not on file   Number of children: Not on file   Years of education: Not on file   Highest education level: Not on file  Occupational History   Occupation: retired    Fish farm manager: RETIRED  Tobacco Use   Smoking status: Never   Smokeless tobacco: Never  Vaping Use   Vaping Use: Never used  Substance and Sexual Activity   Alcohol use: Not Currently    Comment: BEER/glass of wine A DAY   Drug use: No   Sexual activity: Never  Other Topics Concern   Not on file  Social History Narrative   Not on file   Social Determinants of Health   Financial Resource Strain: Not on file  Food Insecurity: Not on file  Transportation Needs: Not on file  Physical Activity: Not on file  Stress: Not on file  Social Connections: Not on file  Intimate Partner Violence: Not on file    FAMILY HISTORY: Family History   Problem Relation Age of Onset   Diabetes Mother    Heart failure Mother    Hypertension Father    Diabetes Father    Heart disease Father    Heart attack Father     ALLERGIES:  has No Known Allergies.  MEDICATIONS:  Current Outpatient Medications  Medication Sig Dispense Refill   acyclovir (ZOVIRAX) 400 MG tablet TAKE ONE TABLET BY MOUTH TWICE DAILY 60 tablet 2   amLODipine (NORVASC) 10 MG tablet Take 10 mg by mouth daily.     b complex vitamins capsule Take 1 capsule by mouth daily.     gemfibrozil (LOPID) 600 MG tablet Take 600 mg by mouth 2 (two) times daily before a meal.     HYDROcodone-acetaminophen (NORCO/VICODIN) 5-325 MG tablet TAKE ONE TABLET EVERY 6 HOURS AS  NEEDED FOR MODERATE PAIN 30 tablet 0   levothyroxine (SYNTHROID) 50 MCG tablet Take 25 mcg by mouth daily.     LORazepam (ATIVAN) 0.5 MG tablet Take 0.5 mg by mouth daily as needed for anxiety or sleep.     losartan (COZAAR) 50 MG tablet Take 50 mg by mouth daily.     Multiple Vitamin (MULTIVITAMIN) capsule Take 1 capsule by mouth daily.     pantoprazole (PROTONIX) 20 MG tablet Take 20 mg by mouth daily.     polyethylene glycol powder (GLYCOLAX/MIRALAX) 17 GM/SCOOP powder Take 17 g by mouth at bedtime.     pregabalin (LYRICA) 25 MG capsule Take 1 capsule (25 mg total) by mouth 2 (two) times daily. 60 capsule 1   dicyclomine (BENTYL) 10 MG capsule Take 1 capsule (10 mg total) by mouth 3 (three) times daily as needed for spasms. (Patient not taking: Reported on 07/04/2022) 90 capsule 0   ondansetron (ZOFRAN) 8 MG tablet Take 1 tablet (8 mg total) by mouth every 8 (eight) hours as needed. (Patient not taking: Reported on 07/04/2022) 30 tablet 0   No current facility-administered medications for this visit.    REVIEW OF SYSTEMS:   Constitutional: ( - ) fevers, ( - )  chills , ( - ) night sweats Eyes: ( - ) blurriness of vision, ( - ) double vision, ( - ) watery eyes Ears, nose, mouth, throat, and face: ( - ) mucositis,  ( - ) sore throat Respiratory: ( - ) cough, ( - ) dyspnea, ( - ) wheezes Cardiovascular: ( - ) palpitation, ( - ) chest discomfort, ( - ) lower extremity swelling Gastrointestinal:  ( - ) nausea, ( - ) heartburn, ( - ) change in bowel habits Skin: ( - ) abnormal skin rashes Lymphatics: ( - ) new lymphadenopathy, ( - ) easy bruising Neurological: (+ ) numbness, ( +) tingling, ( - ) new weaknesses Behavioral/Psych: ( - ) mood change, ( - ) new changes  All other systems were reviewed with the patient and are negative.  PHYSICAL EXAMINATION: ECOG PERFORMANCE STATUS: 2 - Symptomatic, <50% confined to bed  Vitals:   07/04/22 1026  BP: (!) 146/70  Pulse: 80  Resp: 16  Temp: (!) 97 F (36.1 C)  SpO2: 100%    Filed Weights   07/04/22 1026  Weight: 111 lb 4.8 oz (50.5 kg)   GENERAL: Well-appearing elderly Caucasian female, alert, no distress and comfortable SKIN: skin color, texture, turgor are normal, no rashes or significant lesions EYES: conjunctiva are pink and non-injected, sclera clear LUNGS: clear to auscultation and percussion with normal breathing effort HEART: regular rate & rhythm and no murmurs and no lower extremity edema Musculoskeletal: no cyanosis of digits and no clubbing  PSYCH: alert & oriented x 3, fluent speech NEURO: no focal motor/sensory deficits  LABORATORY DATA:  I have reviewed the data as listed    Latest Ref Rng & Units 07/04/2022    9:56 AM 06/20/2022   12:37 PM 06/06/2022    9:49 AM  CBC  WBC 4.0 - 10.5 K/uL 7.1  4.6  3.6   Hemoglobin 12.0 - 15.0 g/dL 10.3  9.9  9.8   Hematocrit 36.0 - 46.0 % 29.5  28.5  28.3   Platelets 150 - 400 K/uL 277  196  269        Latest Ref Rng & Units 07/04/2022    9:56 AM 06/20/2022   12:37 PM 06/06/2022    9:49  AM  CMP  Glucose 70 - 99 mg/dL 86  97  88   BUN 8 - 23 mg/dL _0 Creatinine 0.44 - 1.00 mg/dL 0.83  0.76  0.66   Sodium 135 - 145 mmol/L 134  134  135   Potassium 3.5 - 5.1 mmol/L 3.9  4.3   4.2   Chloride 98 - 111 mmol/L 103  104  106   CO2 22 - 32 mmol/L _1 Calcium 8.9 - 10.3 mg/dL 9.5  9.0  8.8   Total Protein 6.5 - 8.1 g/dL 7.0  7.2  6.8   Total Bilirubin 0.3 - 1.2 mg/dL 0.4  0.3  0.3   Alkaline Phos 38 - 126 U/L 87  97  89   AST 15 - 41 U/L _2 ALT 0 - 44 U/L _3 Lab Results  Component Value Date   MPROTEIN 0.2 (H) 06/20/2022   MPROTEIN 0.2 (H) 06/06/2022   MPROTEIN 0.2 (H) 05/23/2022   Lab Results  Component Value Date   KPAFRELGTCHN 9.4 06/20/2022   KPAFRELGTCHN 8.6 06/06/2022   KPAFRELGTCHN 10.9 05/23/2022   LAMBDASER 7.4 06/20/2022   LAMBDASER 7.4 06/06/2022   LAMBDASER 6.2 05/23/2022   KAPLAMBRATIO 1.27 06/20/2022   KAPLAMBRATIO 1.16 06/06/2022   KAPLAMBRATIO 1.76 (H) 05/23/2022   RADIOGRAPHIC STUDIES: No results found.  ASSESSMENT & PLAN [name] Page 88 y.o. female with medical history significant for newly diagnosed IgG lambda multiple myeloma who presents for a follow up visit.  #IgG Lambda Multiple Myeloma -- Diagnosis confirmed by bone marrow biopsy on 09/27/2021 which shows evidence of 20% involvement by plasma cells.  Additionally has anemia, macrocytosis, leukopenia, and concern for lytic lesions on bone imaging --Prognostic panel shows a deletion 17 with a TP53 mutation --Started Cycle 1 Day 1 of Velcade/Dex on 10/18/2021 --Switched to Dara/Dex on 03/14/2022 due to neuropathy secondary to Velcade.  Plan: --Due for Cycle 5 Day 1 of Dara/Dex today  --Labs from 06/20/2022 showed M protein 0.2, kappa and lambda light chains normal. Today's myeloma labs pending.  --Labs today show white blood cell count 7.1, hemoglobin 10.3, MCV 91.9, and platelets of 277. Cr 0.83 --if Hgb remains consistently >10 can consider the addition of revlimid.  --Continue with biweekly labs, follow up visit and Dara/Dex treatments.   # H/O Confusion --decreased dexamethasone to 20 mg on infusion days.  --Symptoms resolved.    #Leukopenia/Macrocytic anemia/Vitamin B12 deficiency: --Secondary to chemotherapy and multiple myeloma --Received vitamin B12 injection 1000 mcg weekly x 4 doses. Now transition to monthly.   #Right rib pain, improved: --CT chest from 10/25/2021 that didn't show any new or progressive disease.   #Abdominal cramping--resolved: --Symptoms occur after velcade injection.  --Has Bentyl as needed  #Neuropathy: --Grade 2 involving finger tips and feet --Secondary to velcade  --Currently on Lyrica 25 mg PO BID.   #Supportive Care -- chemotherapy education complete -- port placement not required.  -- zofran 85m q8H PRN and compazine 129mPO q6H for nausea -- acyclovir 40071mO BID for VCZ prophylaxis. Patient was not taking medication as prescribed. Advised importance of taking medication consistently.  -- We will start Zometa as soon as is feasible after dental clearance  No orders of the defined types were placed in this encounter.  All questions were answered. The patient knows to call the clinic with any problems, questions  or concerns.  I have spent a total of 30 minutes minutes of face-to-face and non-face-to-face time, preparing to see the patient, performing a medically appropriate examination, counseling and educating the patient, ordering medications, documenting clinical information in the electronic health record,  and care coordination.   Ledell Peoples, MD Department of Hematology/Oncology [place] [facility] Phone: [phone] Pager: [phone] Email: [email or web address]    07/05/2022 2:18 PM

## 2022-07-04 NOTE — Progress Notes (Signed)
OK to reduce diphenhydramine to 25 mg oral x prior to Darzalex Faspro due to age and tolerating Darzalex Faspro without incident.  T.O. Dr Genia Hotter, PharmD

## 2022-07-04 NOTE — Patient Instructions (Signed)
Daratumumab; Hyaluronidase Injection What is this medication? DARATUMUMAB; HYALURONIDASE (dar a toom ue mab; hye al ur ON i dase) treats multiple myeloma, a type of bone marrow cancer. Daratumumab works by blocking a protein that causes cancer cells to grow and multiply. This helps to slow or stop the spread of cancer cells. Hyaluronidase works by increasing the absorption of other medications in the body to help them work better. This medication may also be used treat amyloidosis, a condition that causes the buildup of a protein (amyloid) in your body. It works by reducing the buildup of this protein, which decreases symptoms. It is a combination medication that contains a monoclonal antibody. This medicine may be used for other purposes; ask your health care provider or pharmacist if you have questions. COMMON BRAND NAME(S): DARZALEX FASPRO What should I tell my care team before I take this medication? They need to know if you have any of these conditions: Heart disease Infection, such as chickenpox, cold sores, herpes, hepatitis B Lung or breathing disease An unusual or allergic reaction to daratumumab, hyaluronidase, other medications, foods, dyes, or preservatives Pregnant or trying to get pregnant Breast-feeding How should I use this medication? This medication is injected under the skin. It is given by your care team in a hospital or clinic setting. Talk to your care team about the use of this medication in children. Special care may be needed. Overdosage: If you think you have taken too much of this medicine contact a poison control center or emergency room at once. NOTE: This medicine is only for you. Do not share this medicine with others. What if I miss a dose? Keep appointments for follow-up doses. It is important not to miss your dose. Call your care team if you are unable to keep an appointment. What may interact with this medication? Interactions have not been studied. This list  may not describe all possible interactions. Give your health care provider a list of all the medicines, herbs, non-prescription drugs, or dietary supplements you use. Also tell them if you smoke, drink alcohol, or use illegal drugs. Some items may interact with your medicine. What should I watch for while using this medication? Your condition will be monitored carefully while you are receiving this medication. This medication can cause serious allergic reactions. To reduce your risk, your care team may give you other medication to take before receiving this one. Be sure to follow the directions from your care team. This medication can affect the results of blood tests to match your blood type. These changes can last for up to 6 months after the final dose. Your care team will do blood tests to match your blood type before you start treatment. Tell all of your care team that you are being treated with this medication before receiving a blood transfusion. This medication can affect the results of some tests used to determine treatment response; extra tests may be needed to evaluate response. Talk to your care team if you wish to become pregnant or think you are pregnant. This medication can cause serious birth defects if taken during pregnancy and for 3 months after the last dose. A reliable form of contraception is recommended while taking this medication and for 3 months after the last dose. Talk to your care team about effective forms of contraception. Do not breast-feed while taking this medication. What side effects may I notice from receiving this medication? Side effects that you should report to your care team as soon as   possible: Allergic reactions--skin rash, itching, hives, swelling of the face, lips, tongue, or throat Heart rhythm changes--fast or irregular heartbeat, dizziness, feeling faint or lightheaded, chest pain, trouble breathing Infection--fever, chills, cough, sore throat, wounds that  don't heal, pain or trouble when passing urine, general feeling of discomfort or being unwell Infusion reactions--chest pain, shortness of breath or trouble breathing, feeling faint or lightheaded Sudden eye pain or change in vision such as blurry vision, seeing halos around lights, vision loss Unusual bruising or bleeding Side effects that usually do not require medical attention (report to your care team if they continue or are bothersome): Constipation Diarrhea Fatigue Nausea Pain, tingling, or numbness in the hands or feet Swelling of the ankles, hands, or feet This list may not describe all possible side effects. Call your doctor for medical advice about side effects. You may report side effects to FDA at 1-800-FDA-1088. Where should I keep my medication? This medication is given in a hospital or clinic. It will not be stored at home. NOTE: This sheet is a summary. It may not cover all possible information. If you have questions about this medicine, talk to your doctor, pharmacist, or health care provider.  2023 Elsevier/Gold Standard (2021-11-20 00:00:00)  

## 2022-07-05 ENCOUNTER — Encounter: Payer: Self-pay | Admitting: Hematology and Oncology

## 2022-07-07 LAB — KAPPA/LAMBDA LIGHT CHAINS
Kappa free light chain: 10.9 mg/L (ref 3.3–19.4)
Kappa, lambda light chain ratio: 1.58 (ref 0.26–1.65)
Lambda free light chains: 6.9 mg/L (ref 5.7–26.3)

## 2022-07-08 ENCOUNTER — Other Ambulatory Visit: Payer: Self-pay

## 2022-07-09 LAB — MULTIPLE MYELOMA PANEL, SERUM
Albumin SerPl Elph-Mcnc: 3.7 g/dL (ref 2.9–4.4)
Albumin/Glob SerPl: 1.4 (ref 0.7–1.7)
Alpha 1: 0.3 g/dL (ref 0.0–0.4)
Alpha2 Glob SerPl Elph-Mcnc: 0.9 g/dL (ref 0.4–1.0)
B-Globulin SerPl Elph-Mcnc: 1.2 g/dL (ref 0.7–1.3)
Gamma Glob SerPl Elph-Mcnc: 0.4 g/dL (ref 0.4–1.8)
Globulin, Total: 2.8 g/dL (ref 2.2–3.9)
IgA: 13 mg/dL — ABNORMAL LOW (ref 64–422)
IgG (Immunoglobin G), Serum: 764 mg/dL (ref 586–1602)
IgM (Immunoglobulin M), Srm: 18 mg/dL — ABNORMAL LOW (ref 26–217)
M Protein SerPl Elph-Mcnc: 0.2 g/dL — ABNORMAL HIGH
Total Protein ELP: 6.5 g/dL (ref 6.0–8.5)

## 2022-07-10 ENCOUNTER — Other Ambulatory Visit: Payer: Self-pay

## 2022-07-16 ENCOUNTER — Other Ambulatory Visit: Payer: Self-pay

## 2022-07-18 ENCOUNTER — Inpatient Hospital Stay: Payer: Medicare PPO | Admitting: Physician Assistant

## 2022-07-18 ENCOUNTER — Inpatient Hospital Stay: Payer: Medicare PPO | Attending: Hematology and Oncology

## 2022-07-18 ENCOUNTER — Inpatient Hospital Stay: Payer: Medicare PPO

## 2022-07-18 VITALS — BP 125/50 | HR 65 | Temp 97.3°F | Resp 16 | Wt 111.2 lb

## 2022-07-18 VITALS — BP 115/60 | HR 65 | Temp 97.7°F | Resp 18

## 2022-07-18 DIAGNOSIS — E538 Deficiency of other specified B group vitamins: Secondary | ICD-10-CM

## 2022-07-18 DIAGNOSIS — C9000 Multiple myeloma not having achieved remission: Secondary | ICD-10-CM

## 2022-07-18 DIAGNOSIS — T451X5D Adverse effect of antineoplastic and immunosuppressive drugs, subsequent encounter: Secondary | ICD-10-CM | POA: Insufficient documentation

## 2022-07-18 DIAGNOSIS — Z79899 Other long term (current) drug therapy: Secondary | ICD-10-CM | POA: Insufficient documentation

## 2022-07-18 DIAGNOSIS — G620 Drug-induced polyneuropathy: Secondary | ICD-10-CM | POA: Insufficient documentation

## 2022-07-18 DIAGNOSIS — G629 Polyneuropathy, unspecified: Secondary | ICD-10-CM

## 2022-07-18 DIAGNOSIS — Z5112 Encounter for antineoplastic immunotherapy: Secondary | ICD-10-CM | POA: Insufficient documentation

## 2022-07-18 DIAGNOSIS — Z800 Family history of malignant neoplasm of digestive organs: Secondary | ICD-10-CM | POA: Insufficient documentation

## 2022-07-18 LAB — CBC WITH DIFFERENTIAL (CANCER CENTER ONLY)
Abs Immature Granulocytes: 0.01 10*3/uL (ref 0.00–0.07)
Basophils Absolute: 0 10*3/uL (ref 0.0–0.1)
Basophils Relative: 1 %
Eosinophils Absolute: 0.1 10*3/uL (ref 0.0–0.5)
Eosinophils Relative: 3 %
HCT: 30.4 % — ABNORMAL LOW (ref 36.0–46.0)
Hemoglobin: 10.4 g/dL — ABNORMAL LOW (ref 12.0–15.0)
Immature Granulocytes: 0 %
Lymphocytes Relative: 33 %
Lymphs Abs: 1.4 10*3/uL (ref 0.7–4.0)
MCH: 31.5 pg (ref 26.0–34.0)
MCHC: 34.2 g/dL (ref 30.0–36.0)
MCV: 92.1 fL (ref 80.0–100.0)
Monocytes Absolute: 0.6 10*3/uL (ref 0.1–1.0)
Monocytes Relative: 15 %
Neutro Abs: 2 10*3/uL (ref 1.7–7.7)
Neutrophils Relative %: 48 %
Platelet Count: 265 10*3/uL (ref 150–400)
RBC: 3.3 MIL/uL — ABNORMAL LOW (ref 3.87–5.11)
RDW: 14.6 % (ref 11.5–15.5)
WBC Count: 4.1 10*3/uL (ref 4.0–10.5)
nRBC: 0 % (ref 0.0–0.2)

## 2022-07-18 LAB — CMP (CANCER CENTER ONLY)
ALT: 8 U/L (ref 0–44)
AST: 17 U/L (ref 15–41)
Albumin: 4.1 g/dL (ref 3.5–5.0)
Alkaline Phosphatase: 86 U/L (ref 38–126)
Anion gap: 7 (ref 5–15)
BUN: 17 mg/dL (ref 8–23)
CO2: 23 mmol/L (ref 22–32)
Calcium: 9.4 mg/dL (ref 8.9–10.3)
Chloride: 104 mmol/L (ref 98–111)
Creatinine: 0.57 mg/dL (ref 0.44–1.00)
GFR, Estimated: >60 mL/min (ref >60)
Glucose, Bld: 83 mg/dL (ref 70–99)
Potassium: 3.7 mmol/L (ref 3.5–5.1)
Sodium: 134 mmol/L — ABNORMAL LOW (ref 135–145)
Total Bilirubin: 0.3 mg/dL (ref 0.3–1.2)
Total Protein: 6.9 g/dL (ref 6.5–8.1)

## 2022-07-18 LAB — LACTATE DEHYDROGENASE: LDH: 139 U/L (ref 98–192)

## 2022-07-18 MED ORDER — CYANOCOBALAMIN 1000 MCG/ML IJ SOLN
1000.0000 ug | Freq: Once | INTRAMUSCULAR | Status: AC
  Administered 2022-07-18: 1000 ug via INTRAMUSCULAR
  Filled 2022-07-18: qty 1

## 2022-07-18 MED ORDER — DIPHENHYDRAMINE HCL 25 MG PO CAPS
50.0000 mg | ORAL_CAPSULE | Freq: Once | ORAL | Status: AC
  Administered 2022-07-18: 50 mg via ORAL
  Filled 2022-07-18: qty 2

## 2022-07-18 MED ORDER — DARATUMUMAB-HYALURONIDASE-FIHJ 1800-30000 MG-UT/15ML ~~LOC~~ SOLN
1800.0000 mg | Freq: Once | SUBCUTANEOUS | Status: AC
  Administered 2022-07-18: 1800 mg via SUBCUTANEOUS
  Filled 2022-07-18: qty 15

## 2022-07-18 MED ORDER — PREGABALIN 25 MG PO CAPS: 25.0000 mg | ORAL_CAPSULE | Freq: Two times a day (BID) | ORAL | 1 refills | Status: DC

## 2022-07-18 MED ORDER — DEXAMETHASONE 4 MG PO TABS
20.0000 mg | ORAL_TABLET | Freq: Once | ORAL | Status: AC
  Administered 2022-07-18: 20 mg via ORAL
  Filled 2022-07-18: qty 5

## 2022-07-18 MED ORDER — HYDROCODONE-ACETAMINOPHEN 5-325 MG PO TABS: ORAL_TABLET | ORAL | 0 refills | Status: DC

## 2022-07-18 MED ORDER — ACETAMINOPHEN 325 MG PO TABS
650.0000 mg | ORAL_TABLET | Freq: Once | ORAL | Status: AC
  Administered 2022-07-18: 650 mg via ORAL
  Filled 2022-07-18: qty 2

## 2022-07-18 NOTE — Progress Notes (Signed)
[place] [phone]   [phone]  PROGRESS NOTE  Patient Care Team: Burnard Bunting, MD as PCP - General (Internal Medicine)  Hematological/Oncological History # IgG Lambda Multiple Myeloma 06/04/2021: WBC 5.5, Hgb 11.1, MCV 101.3, Plt 306 07/24/2021: M protein 2.3, IFE shows IgG Lambda monoclonal specificity. Cr 0.9 08/07/2021: Establish care with Dr. Lorenso Courier 09/27/2021: Bmbx showed hypercellular bone marrow with plasma cell neoplasm, increased number of atypical plasma cells averaging 20% of all cells in the aspirate 10/18/2021: Cycle 1 of Velcade/Dexamethasone 11/08/2021: Cycle 2 of Velcade/Dexamethasone 11/29/2021: Cycle 3 of Velcade/Dexamethasone 12/20/2021: Cycle 4 of Velcade/Dexamethasone 01/10/2022: Cycle 5 of Velcade/Dexamethasone 01/31/2022: Cycle 6 of Velcade/Dexamethasone 02/28/2022: Cycle 7 of Velcade/Dexamethasone. Dose reduced velcade due to neuropathy  03/14/2022: Cycle 1 Day 1 of Dara/Dex (velcade d/c due to neuropathy).  04/11/2022: Cycle 2 Day 1 of Dara/Dex 05/09/2022: Cycle 3 Day 1 of Dara/Dex 06/06/2022: Cycle 4 Day 1 of Dara/Dex 07/04/2022: Cycle 5 Day 1 of Dara/Dex  Interval History:  [name] Page 88 y.o. female with medical history significant for  IgG lambda multiple myeloma who presents for a follow up visit. The patient's last visit was on 07/04/2022. In the interim, patient has had no major changes in her health. She is due for Cycle 5, Day 15.  On exam today Mrs. Mccomas reports she and her husband continues to do well. Her energy has improved as she is more active in the kitchen and requires only one nap per day. Her appetite and weight are unchanged. She denies nausea, vomiting or abdominal pain. Her bowel habits are unchanged without any recurrent episodes of diarrhea or constipation. She has chronic back pain that is well controlled with hydrocodone-acetaminophen, 1-2 times per day. Her neuropathy is stable which does affect  her hands bilaterally. She is tolerating her Darzalex treatment without any prohibitive toxicities. She denies fevers, chills, night sweats, shortness of breath, chest pain or cough.  She has no other complaints. Rest of the 10 point ROS is below.  MEDICAL HISTORY:  Past Medical History:  Diagnosis Date   Alopecia 10/28/2016   Anginal pain (HCC)    Arthritis    Cervical spondylosis    C3-4 AND C4-5   Chest pain 07/28/2008   H/O, normal stress nuclear EF 78%   Chronic cough 07/12/2014   Collapsed lung 07/2010   being treated at [facility] - in left lung   Complication of anesthesia    Cough 07/30/2010   Qualifier: Diagnosis of  By: Chase Caller MD, Murali     Cystocele 05/23/2015   Cystocele, midline 05/02/2013   DEEP VEIN THROMBOSIS/PHLEBITIS 07/30/2010   Qualifier: History of  By: Harvest Dark CMA, Jennifer     Dislocation closed, shoulder 02/2013   DVT (deep venous thrombosis) ([place]) 2006   Encounter for therapeutic drug monitoring 10/03/2014   GERD (gastroesophageal reflux disease)    occ   Headache    hx   High cholesterol    History of recurrent UTIs 09/24/2011   HYPERLIPIDEMIA 07/30/2010   Qualifier: Diagnosis of  By: Harvest Dark CMA, Jennifer     Hypertension    HYPERTENSION 07/30/2010   Qualifier: Diagnosis of  By: Harvest Dark CMA, Jennifer     Hypothyroidism    Nasal itching 01/30/2014   Osteopenia 05/23/2015   Peripheral vascular disease ([place]) 2006   dvt's and 50 yrs ago   Pleuritic pain 12/17/2015   PONV (postoperative nausea and vomiting)    Primary localized osteoarthritis of right hip 08/26/2016   Primary  osteoarthritis of right hip 08/26/2016   Pulmonary collapse 07/30/2010   Qualifier: Diagnosis of  By: Chase Caller MD, Murali     PULMONARY NODULE 07/30/2010   Qualifier: Diagnosis of  By: Chase Caller MD, Murali     Rash and nonspecific skin eruption 01/02/2015   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   Sarcoidosis    Sarcoidosis of lung ([place])    Vaginal atrophy 09/24/2011    Visual field defect nasal step 01/02/2015    SURGICAL HISTORY: Past Surgical History:  Procedure Laterality Date   BACK SURGERY  2022   EYE SURGERY     FOOT SURGERY Left 06/2005   bunion hammer toe   HEMORRHOID SURGERY  12/1972   INTRAOCULAR LENS INSERTION     right eye 10 /16 and left 06/16/2005   JOINT REPLACEMENT     KYPHOPLASTY N/A 06/27/2021   Procedure: LUMBAR 3 AND LUMBAR  5 KYPHOPLASTY;  Surgeon: Phylliss Bob, MD;  Location: [facility];  Service: Orthopedics;  Laterality: N/A;   KYPHOPLASTY N/A 09/04/2021   Procedure: THORACIC SEVEN, THORACIC EIGHT, THORACIC NINE, THORACIC TEN KYPHOPLASTY;  Surgeon: Phylliss Bob, MD;  Location: [facility];  Service: Orthopedics;  Laterality: N/A;   SKIN BIOPSY  01/2007   basal cell carcinoma removed from right lower leg    TOTAL HIP ARTHROPLASTY  06/2007   left   TOTAL HIP ARTHROPLASTY Right 08/26/2016   Procedure: TOTAL HIP ARTHROPLASTY ANTERIOR APPROACH;  Surgeon: Melrose Nakayama, MD;  Location: [facility];  Service: Orthopedics;  Laterality: Right;   TOTAL KNEE ARTHROPLASTY Right 10/2007   right    SOCIAL HISTORY: Social History   Socioeconomic History   Marital status: Married    Spouse name: Not on file   Number of children: Not on file   Years of education: Not on file   Highest education level: Not on file  Occupational History   Occupation: retired    Fish farm manager: RETIRED  Tobacco Use   Smoking status: Never   Smokeless tobacco: Never  Vaping Use   Vaping Use: Never used  Substance and Sexual Activity   Alcohol use: Not Currently    Comment: BEER/glass of wine A DAY   Drug use: No   Sexual activity: Never  Other Topics Concern   Not on file  Social History Narrative   Not on file   Social Determinants of Health   Financial Resource Strain: Not on file  Food Insecurity: Not on file  Transportation Needs: Not on file  Physical Activity: Not on file  Stress: Not on file  Social Connections: Not on file  Intimate Partner  Violence: Not on file    FAMILY HISTORY: Family History  Problem Relation Age of Onset   Diabetes Mother    Heart failure Mother    Hypertension Father    Diabetes Father    Heart disease Father    Heart attack Father     ALLERGIES:  has No Known Allergies.  MEDICATIONS:  Current Outpatient Medications  Medication Sig Dispense Refill   acyclovir (ZOVIRAX) 400 MG tablet TAKE ONE TABLET BY MOUTH TWICE DAILY 60 tablet 2   amLODipine (NORVASC) 10 MG tablet Take 10 mg by mouth daily.     b complex vitamins capsule Take 1 capsule by mouth daily.     dicyclomine (BENTYL) 10 MG capsule Take 1 capsule (10 mg total) by mouth 3 (three) times daily as needed for spasms. 90 capsule 0   gemfibrozil (LOPID) 600 MG tablet Take 600 mg  by mouth 2 (two) times daily before a meal.     HYDROcodone-acetaminophen (NORCO/VICODIN) 5-325 MG tablet TAKE ONE TABLET EVERY 6 HOURS AS NEEDED FOR MODERATE PAIN 30 tablet 0   levothyroxine (SYNTHROID) 50 MCG tablet Take 25 mcg by mouth daily.     LORazepam (ATIVAN) 0.5 MG tablet Take 0.5 mg by mouth daily as needed for anxiety or sleep.     losartan (COZAAR) 50 MG tablet Take 50 mg by mouth daily.     Multiple Vitamin (MULTIVITAMIN) capsule Take 1 capsule by mouth daily.     ondansetron (ZOFRAN) 8 MG tablet Take 1 tablet (8 mg total) by mouth every 8 (eight) hours as needed. 30 tablet 0   pantoprazole (PROTONIX) 20 MG tablet Take 20 mg by mouth daily.     polyethylene glycol powder (GLYCOLAX/MIRALAX) 17 GM/SCOOP powder Take 17 g by mouth at bedtime.     pregabalin (LYRICA) 25 MG capsule Take 1 capsule (25 mg total) by mouth 2 (two) times daily. 60 capsule 1   No current facility-administered medications for this visit.    REVIEW OF SYSTEMS:   Constitutional: ( - ) fevers, ( - )  chills , ( - ) night sweats Eyes: ( - ) blurriness of vision, ( - ) double vision, ( - ) watery eyes Ears, nose, mouth, throat, and face: ( - ) mucositis, ( - ) sore  throat Respiratory: ( - ) cough, ( - ) dyspnea, ( - ) wheezes Cardiovascular: ( - ) palpitation, ( - ) chest discomfort, ( - ) lower extremity swelling Gastrointestinal:  ( - ) nausea, ( - ) heartburn, ( - ) change in bowel habits Skin: ( - ) abnormal skin rashes Lymphatics: ( - ) new lymphadenopathy, ( - ) easy bruising Neurological: (+ ) numbness, ( +) tingling, ( - ) new weaknesses Behavioral/Psych: ( - ) mood change, ( - ) new changes  All other systems were reviewed with the patient and are negative.  PHYSICAL EXAMINATION: ECOG PERFORMANCE STATUS: 1 - Symptomatic but completely ambulatory  Vitals:   07/18/22 0947  BP: (!) 125/50  Pulse: 65  Resp: 16  Temp: (!) 97.3 F (36.3 C)  SpO2: 100%    Filed Weights   07/18/22 0947  Weight: 111 lb 3 oz (50.4 kg)   GENERAL: Well-appearing elderly Caucasian female, alert, no distress and comfortable SKIN: skin color, texture, turgor are normal, no rashes or significant lesions EYES: conjunctiva are pink and non-injected, sclera clear LUNGS: clear to auscultation and percussion with normal breathing effort HEART: regular rate & rhythm and no murmurs and no lower extremity edema Musculoskeletal: no cyanosis of digits and no clubbing  PSYCH: alert & oriented x 3, fluent speech NEURO: no focal motor/sensory deficits  LABORATORY DATA:  I have reviewed the data as listed    Latest Ref Rng & Units 07/18/2022    9:23 AM 07/04/2022    9:56 AM 06/20/2022   12:37 PM  CBC  WBC 4.0 - 10.5 K/uL 4.1  7.1  4.6   Hemoglobin 12.0 - 15.0 g/dL 10.4  10.3  9.9   Hematocrit 36.0 - 46.0 % 30.4  29.5  28.5   Platelets 150 - 400 K/uL 265  277  196        Latest Ref Rng & Units 07/18/2022    9:23 AM 07/04/2022    9:56 AM 06/20/2022   12:37 PM  CMP  Glucose 70 - 99 mg/dL 83  86  97  BUN 8 - 23 mg/dL _0 Creatinine 0.44 - 1.00 mg/dL 0.57  0.83  0.76   Sodium 135 - 145 mmol/L 134  134  134   Potassium 3.5 - 5.1 mmol/L 3.7  3.9  4.3    Chloride 98 - 111 mmol/L 104  103  104   CO2 22 - 32 mmol/L _1 Calcium 8.9 - 10.3 mg/dL 9.4  9.5  9.0   Total Protein 6.5 - 8.1 g/dL 6.9  7.0  7.2   Total Bilirubin 0.3 - 1.2 mg/dL 0.3  0.4  0.3   Alkaline Phos 38 - 126 U/L 86  87  97   AST 15 - 41 U/L _2 ALT 0 - 44 U/L _3 Lab Results  Component Value Date   MPROTEIN 0.2 (H) 07/04/2022   MPROTEIN 0.2 (H) 06/20/2022   MPROTEIN 0.2 (H) 06/06/2022   Lab Results  Component Value Date   KPAFRELGTCHN 10.9 07/04/2022   KPAFRELGTCHN 9.4 06/20/2022   KPAFRELGTCHN 8.6 06/06/2022   LAMBDASER 6.9 07/04/2022   LAMBDASER 7.4 06/20/2022   LAMBDASER 7.4 06/06/2022   KAPLAMBRATIO 1.58 07/04/2022   KAPLAMBRATIO 1.27 06/20/2022   KAPLAMBRATIO 1.16 06/06/2022   RADIOGRAPHIC STUDIES: No results found.  ASSESSMENT & PLAN CALENE PARADISO 88 y.o. female with medical history significant for newly diagnosed IgG lambda multiple myeloma who presents for a follow up visit.  #IgG Lambda Multiple Myeloma -- Diagnosis confirmed by bone marrow biopsy on 09/27/2021 which shows evidence of 20% involvement by plasma cells.  Additionally has anemia, macrocytosis, leukopenia, and concern for lytic lesions on bone imaging --Prognostic panel shows a deletion 17 with a TP53 mutation --Started Cycle 1 Day 1 of Velcade/Dex on 10/18/2021 --Switched to Dara/Dex on 03/14/2022 due to neuropathy secondary to Velcade.  Plan: --Due for Cycle 5 Day 15 of Dara/Dex today  --Labs from 07/04/2022 showed M protein 0.2, kappa and lambda light chains normal. Today's myeloma labs pending.  --Labs today show white blood cell count 4.1, hemoglobin 10.4, MCV 92.1, and platelets of 265. Cr 0.57, Calcium 9.4.  --if Hgb remains consistently >10 can consider the addition of revlimid.  --Continue with biweekly labs, follow up visit and Dara/Dex treatments.   # H/O Confusion --decreased dexamethasone to 20 mg on infusion days.  --Symptoms resolved.    #Leukopenia/Macrocytic anemia/Vitamin B12 deficiency: --Secondary to chemotherapy and multiple myeloma --Received vitamin B12 injection 1000 mcg weekly x 4 doses. Now transition to monthly. Last dose given on 06/06/2022. Next due today.   #Right rib pain, improved: --CT chest from 10/25/2021 that didn't show any new or progressive disease.   #Abdominal cramping--resolved: --Symptoms occur after velcade injection.  --Has Bentyl as needed  #Neuropathy: --Grade 2 involving finger tips and feet --Secondary to velcade  --Currently on Lyrica 25 mg PO BID. Sent refill.   #Supportive Care -- chemotherapy education complete -- port placement not required.  -- zofran 48m q8H PRN and compazine 139mPO q6H for nausea -- acyclovir 40014mO BID for VCZ prophylaxis. Patient was not taking medication as prescribed.  -- Await dental clearance to start Zometa  No orders of the defined types were placed in this encounter.  All questions were answered. The patient knows to call the clinic with any problems, questions or concerns.  I have spent a total of 30 minutes minutes of face-to-face and non-face-to-face  time, preparing to see the patient, performing a medically appropriate examination, counseling and educating the patient, ordering medications, documenting clinical information in the electronic health record,  and care coordination.   Dede Query PA-C Dept of Hematology and [place] at [facility] Phone: [phone]   07/18/2022 10:10 AM

## 2022-07-18 NOTE — Patient Instructions (Signed)
[facility] ONCOLOGY  Discharge Instructions: Thank you for choosing [facility] to provide your oncology and hematology care.   If you have a lab appointment with the [facility], please go directly to the [facility] and check in at the registration area.   Wear comfortable clothing and clothing appropriate for easy access to any Portacath or PICC line.   We strive to give you quality time with your provider. You may need to reschedule your appointment if you arrive late (15 or more minutes).  Arriving late affects you and other patients whose appointments are after yours.  Also, if you miss three or more appointments without notifying the office, you may be dismissed from the clinic at the provider's discretion.      For prescription refill requests, have your pharmacy contact our office and allow 72 hours for refills to be completed.    Today you received the following chemotherapy and/or immunotherapy agents: Darzalex-Faspro      To help prevent nausea and vomiting after your treatment, we encourage you to take your nausea medication as directed.  BELOW ARE SYMPTOMS THAT SHOULD BE REPORTED IMMEDIATELY: *FEVER GREATER THAN 100.4 F (38 C) OR HIGHER *CHILLS OR SWEATING *NAUSEA AND VOMITING THAT IS NOT CONTROLLED WITH YOUR NAUSEA MEDICATION *UNUSUAL SHORTNESS OF BREATH *UNUSUAL BRUISING OR BLEEDING *URINARY PROBLEMS (pain or burning when urinating, or frequent urination) *BOWEL PROBLEMS (unusual diarrhea, constipation, pain near the anus) TENDERNESS IN MOUTH AND THROAT WITH OR WITHOUT PRESENCE OF ULCERS (sore throat, sores in mouth, or a toothache) UNUSUAL RASH, SWELLING OR PAIN  UNUSUAL VAGINAL DISCHARGE OR ITCHING   Items with * indicate a potential emergency and should be followed up as soon as possible or go to the Emergency Department if any problems should occur.  Please show the CHEMOTHERAPY ALERT CARD or IMMUNOTHERAPY ALERT CARD at  check-in to the Emergency Department and triage nurse.  Should you have questions after your visit or need to cancel or reschedule your appointment, please contact [facility]  Dept: [phone]  and follow the prompts.  Office hours are 8:00 a.m. to 4:30 p.m. Monday - Friday. Please note that voicemails left after 4:00 p.m. may not be returned until the following business day.  We are closed weekends and major holidays. You have access to a nurse at all times for urgent questions. Please call the main number to the clinic Dept: [phone] and follow the prompts.   For any non-urgent questions, you may also contact your provider using MyChart. We now offer e-Visits for anyone 109 and older to request care online for non-urgent symptoms. For details visit [email or web address].   Also download the MyChart app! Go to the app store, search "MyChart", open the app, select [facility], and log in with your MyChart username and password.  Masks are optional in the cancer centers. If you would like for your care team to wear a mask while they are taking care of you, please let them know. You may have one support person who is at least 86 years old accompany you for your appointments.

## 2022-07-19 LAB — BETA 2 MICROGLOBULIN, SERUM: Beta-2 Microglobulin: 1.8 mg/L (ref 0.6–2.4)

## 2022-07-21 LAB — KAPPA/LAMBDA LIGHT CHAINS
Kappa free light chain: 8.7 mg/L (ref 3.3–19.4)
Kappa, lambda light chain ratio: 1.53 (ref 0.26–1.65)
Lambda free light chains: 5.7 mg/L (ref 5.7–26.3)

## 2022-07-23 ENCOUNTER — Other Ambulatory Visit: Payer: Self-pay | Admitting: *Deleted

## 2022-07-23 DIAGNOSIS — C9000 Multiple myeloma not having achieved remission: Secondary | ICD-10-CM

## 2022-07-23 MED ORDER — ACYCLOVIR 400 MG PO TABS: 400.0000 mg | ORAL_TABLET | Freq: Two times a day (BID) | ORAL | 2 refills | Status: DC

## 2022-07-28 LAB — MULTIPLE MYELOMA PANEL, SERUM
Albumin SerPl Elph-Mcnc: 3.6 g/dL (ref 2.9–4.4)
Albumin/Glob SerPl: 1.3 (ref 0.7–1.7)
Alpha 1: 0.3 g/dL (ref 0.0–0.4)
Alpha2 Glob SerPl Elph-Mcnc: 0.9 g/dL (ref 0.4–1.0)
B-Globulin SerPl Elph-Mcnc: 1.3 g/dL (ref 0.7–1.3)
Gamma Glob SerPl Elph-Mcnc: 0.4 g/dL (ref 0.4–1.8)
Globulin, Total: 2.8 g/dL (ref 2.2–3.9)
IgA: 13 mg/dL — ABNORMAL LOW (ref 64–422)
IgG (Immunoglobin G), Serum: 710 mg/dL (ref 586–1602)
IgM (Immunoglobulin M), Srm: 19 mg/dL — ABNORMAL LOW (ref 26–217)
M Protein SerPl Elph-Mcnc: 0.2 g/dL — ABNORMAL HIGH
Total Protein ELP: 6.4 g/dL (ref 6.0–8.5)

## 2022-08-01 ENCOUNTER — Inpatient Hospital Stay (HOSPITAL_BASED_OUTPATIENT_CLINIC_OR_DEPARTMENT_OTHER): Payer: Medicare PPO | Admitting: Hematology and Oncology

## 2022-08-01 ENCOUNTER — Inpatient Hospital Stay: Payer: Medicare PPO

## 2022-08-01 VITALS — BP 146/59 | HR 72 | Temp 97.6°F | Resp 18 | Wt 110.4 lb

## 2022-08-01 DIAGNOSIS — E538 Deficiency of other specified B group vitamins: Secondary | ICD-10-CM

## 2022-08-01 DIAGNOSIS — C9000 Multiple myeloma not having achieved remission: Secondary | ICD-10-CM

## 2022-08-01 DIAGNOSIS — G629 Polyneuropathy, unspecified: Secondary | ICD-10-CM

## 2022-08-01 DIAGNOSIS — Z5112 Encounter for antineoplastic immunotherapy: Secondary | ICD-10-CM | POA: Diagnosis not present

## 2022-08-01 LAB — CBC WITH DIFFERENTIAL (CANCER CENTER ONLY)
Abs Immature Granulocytes: 0.01 10*3/uL (ref 0.00–0.07)
Basophils Absolute: 0 10*3/uL (ref 0.0–0.1)
Basophils Relative: 0 %
Eosinophils Absolute: 0.1 10*3/uL (ref 0.0–0.5)
Eosinophils Relative: 3 %
HCT: 30.6 % — ABNORMAL LOW (ref 36.0–46.0)
Hemoglobin: 10.6 g/dL — ABNORMAL LOW (ref 12.0–15.0)
Immature Granulocytes: 0 %
Lymphocytes Relative: 32 %
Lymphs Abs: 1.5 10*3/uL (ref 0.7–4.0)
MCH: 31.7 pg (ref 26.0–34.0)
MCHC: 34.6 g/dL (ref 30.0–36.0)
MCV: 91.6 fL (ref 80.0–100.0)
Monocytes Absolute: 0.5 10*3/uL (ref 0.1–1.0)
Monocytes Relative: 11 %
Neutro Abs: 2.5 10*3/uL (ref 1.7–7.7)
Neutrophils Relative %: 54 %
Platelet Count: 280 10*3/uL (ref 150–400)
RBC: 3.34 MIL/uL — ABNORMAL LOW (ref 3.87–5.11)
RDW: 15 % (ref 11.5–15.5)
WBC Count: 4.7 10*3/uL (ref 4.0–10.5)
nRBC: 0 % (ref 0.0–0.2)

## 2022-08-01 LAB — CMP (CANCER CENTER ONLY)
ALT: 7 U/L (ref 0–44)
AST: 18 U/L (ref 15–41)
Albumin: 4.3 g/dL (ref 3.5–5.0)
Alkaline Phosphatase: 94 U/L (ref 38–126)
Anion gap: 7 (ref 5–15)
BUN: 12 mg/dL (ref 8–23)
CO2: 23 mmol/L (ref 22–32)
Calcium: 9.4 mg/dL (ref 8.9–10.3)
Chloride: 104 mmol/L (ref 98–111)
Creatinine: 0.76 mg/dL (ref 0.44–1.00)
GFR, Estimated: >60 mL/min (ref >60)
Glucose, Bld: 102 mg/dL — ABNORMAL HIGH (ref 70–99)
Potassium: 4.1 mmol/L (ref 3.5–5.1)
Sodium: 134 mmol/L — ABNORMAL LOW (ref 135–145)
Total Bilirubin: 0.4 mg/dL (ref 0.3–1.2)
Total Protein: 7.3 g/dL (ref 6.5–8.1)

## 2022-08-01 LAB — LACTATE DEHYDROGENASE: LDH: 144 U/L (ref 98–192)

## 2022-08-01 MED ORDER — DIPHENHYDRAMINE HCL 25 MG PO CAPS
50.0000 mg | ORAL_CAPSULE | Freq: Once | ORAL | Status: AC
  Administered 2022-08-01: 50 mg via ORAL
  Filled 2022-08-01: qty 2

## 2022-08-01 MED ORDER — DEXAMETHASONE 4 MG PO TABS
20.0000 mg | ORAL_TABLET | Freq: Once | ORAL | Status: AC
  Administered 2022-08-01: 20 mg via ORAL
  Filled 2022-08-01: qty 5

## 2022-08-01 MED ORDER — DARATUMUMAB-HYALURONIDASE-FIHJ 1800-30000 MG-UT/15ML ~~LOC~~ SOLN
1800.0000 mg | Freq: Once | SUBCUTANEOUS | Status: AC
  Administered 2022-08-01: 1800 mg via SUBCUTANEOUS
  Filled 2022-08-01: qty 15

## 2022-08-01 MED ORDER — ACETAMINOPHEN 325 MG PO TABS
650.0000 mg | ORAL_TABLET | Freq: Once | ORAL | Status: AC
  Administered 2022-08-01: 650 mg via ORAL
  Filled 2022-08-01: qty 2

## 2022-08-01 NOTE — Progress Notes (Unsigned)
[facility] [phone]   [phone]  PROGRESS NOTE  Patient Care Team: Burnard Bunting, MD as PCP - General (Internal Medicine)  Hematological/Oncological History # IgG Lambda Multiple Myeloma 06/04/2021: WBC 5.5, Hgb 11.1, MCV 101.3, Plt 306 07/24/2021: M protein 2.3, IFE shows IgG Lambda monoclonal specificity. Cr 0.9 08/07/2021: Establish care with Dr. Lorenso Courier 09/27/2021: Bmbx showed hypercellular bone marrow with plasma cell neoplasm, increased number of atypical plasma cells averaging 20% of all cells in the aspirate 10/18/2021: Cycle 1 of Velcade/Dexamethasone 11/08/2021: Cycle 2 of Velcade/Dexamethasone 11/29/2021: Cycle 3 of Velcade/Dexamethasone 12/20/2021: Cycle 4 of Velcade/Dexamethasone 01/10/2022: Cycle 5 of Velcade/Dexamethasone 01/31/2022: Cycle 6 of Velcade/Dexamethasone 02/28/2022: Cycle 7 of Velcade/Dexamethasone. Dose reduced velcade due to neuropathy  03/14/2022: Cycle 1 Day 1 of Dara/Dex (velcade d/c due to neuropathy).  04/11/2022: Cycle 2 Day 1 of Dara/Dex 05/09/2022: Cycle 3 Day 1 of Dara/Dex 06/06/2022: Cycle 4 Day 1 of Dara/Dex 07/04/2022: Cycle 5 Day 1 of Dara/Dex 07/25/2022: Cycle 6 Day 1 of Dara/Dex  Interval History:  [name] Page 88 y.o. female with medical history significant for  IgG lambda multiple myeloma who presents for a follow up visit. The patient's last visit was on 07/04/2022. In the interim, patient has had no major changes in her health. She is due for Cycle 6, Day 8.  On exam today [name] Page reports she plans on having a quiet holiday with her husband.  She reports that she is feeling quite well and notes that she does have some occasionally tingling in her toes.  She normally has this occur when she is sitting for long periods of time.  It is not pain or pins and needle sensation but just some numbness.  She reports she is having no issues with the Darzalex.  She reports that rarely she gets a cramp but overall  she is not having any nausea, vomiting, or diarrhea.  She is not having any rash or itchiness.  Her appetite has been good and her weight has been stable.  She is willing and able to proceed with therapy at this time.. She denies fevers, chills, night sweats, shortness of breath, chest pain or cough.  She has no other complaints. Rest of the 10 point ROS is below.  MEDICAL HISTORY:  Past Medical History:  Diagnosis Date   Alopecia 10/28/2016   Anginal pain (HCC)    Arthritis    Cervical spondylosis    C3-4 AND C4-5   Chest pain 07/28/2008   H/O, normal stress nuclear EF 78%   Chronic cough 07/12/2014   Collapsed lung 07/2010   being treated at [facility] - in left lung   Complication of anesthesia    Cough 07/30/2010   Qualifier: Diagnosis of  By: Chase Caller MD, Murali     Cystocele 05/23/2015   Cystocele, midline 05/02/2013   DEEP VEIN THROMBOSIS/PHLEBITIS 07/30/2010   Qualifier: History of  By: Harvest Dark CMA, Jennifer     Dislocation closed, shoulder 02/2013   DVT (deep venous thrombosis) ([place]) 2006   Encounter for therapeutic drug monitoring 10/03/2014   GERD (gastroesophageal reflux disease)    occ   Headache    hx   High cholesterol    History of recurrent UTIs 09/24/2011   HYPERLIPIDEMIA 07/30/2010   Qualifier: Diagnosis of  By: Harvest Dark CMA, Jennifer     Hypertension    HYPERTENSION 07/30/2010   Qualifier: Diagnosis of  By: Harvest Dark CMA, Jennifer     Hypothyroidism    Nasal itching  01/30/2014   Osteopenia 05/23/2015   Peripheral vascular disease ([place]) 2006   dvt's and 50 yrs ago   Pleuritic pain 12/17/2015   PONV (postoperative nausea and vomiting)    Primary localized osteoarthritis of right hip 08/26/2016   Primary osteoarthritis of right hip 08/26/2016   Pulmonary collapse 07/30/2010   Qualifier: Diagnosis of  By: Chase Caller MD, Murali     PULMONARY NODULE 07/30/2010   Qualifier: Diagnosis of  By: Chase Caller MD, Murali     Rash and nonspecific skin eruption 01/02/2015    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   Sarcoidosis    Sarcoidosis of lung ([place])    Vaginal atrophy 09/24/2011   Visual field defect nasal step 01/02/2015    SURGICAL HISTORY: Past Surgical History:  Procedure Laterality Date   BACK SURGERY  2022   EYE SURGERY     FOOT SURGERY Left 06/2005   bunion hammer toe   HEMORRHOID SURGERY  12/1972   INTRAOCULAR LENS INSERTION     right eye 10 /16 and left 06/16/2005   JOINT REPLACEMENT     KYPHOPLASTY N/A 06/27/2021   Procedure: LUMBAR 3 AND LUMBAR  5 KYPHOPLASTY;  Surgeon: Phylliss Bob, MD;  Location: [place];  Service: Orthopedics;  Laterality: N/A;   KYPHOPLASTY N/A 09/04/2021   Procedure: THORACIC SEVEN, THORACIC EIGHT, THORACIC NINE, THORACIC TEN KYPHOPLASTY;  Surgeon: Phylliss Bob, MD;  Location: [place];  Service: Orthopedics;  Laterality: N/A;   SKIN BIOPSY  01/2007   basal cell carcinoma removed from right lower leg    TOTAL HIP ARTHROPLASTY  06/2007   left   TOTAL HIP ARTHROPLASTY Right 08/26/2016   Procedure: TOTAL HIP ARTHROPLASTY ANTERIOR APPROACH;  Surgeon: Melrose Nakayama, MD;  Location: [place];  Service: Orthopedics;  Laterality: Right;   TOTAL KNEE ARTHROPLASTY Right 10/2007   right    SOCIAL HISTORY: Social History   Socioeconomic History   Marital status: Married    Spouse name: Not on file   Number of children: Not on file   Years of education: Not on file   Highest education level: Not on file  Occupational History   Occupation: retired    Fish farm manager: RETIRED  Tobacco Use   Smoking status: Never   Smokeless tobacco: Never  Vaping Use   Vaping Use: Never used  Substance and Sexual Activity   Alcohol use: Not Currently    Comment: BEER/glass of wine A DAY   Drug use: No   Sexual activity: Never  Other Topics Concern   Not on file  Social History Narrative   Not on file   Social Determinants of Health   Financial Resource Strain: Not on file  Food Insecurity: Not on file  Transportation Needs: Not  on file  Physical Activity: Not on file  Stress: Not on file  Social Connections: Not on file  Intimate Partner Violence: Not on file    FAMILY HISTORY: Family History  Problem Relation Age of Onset   Diabetes Mother    Heart failure Mother    Hypertension Father    Diabetes Father    Heart disease Father    Heart attack Father     ALLERGIES:  has No Known Allergies.  MEDICATIONS:  Current Outpatient Medications  Medication Sig Dispense Refill   acyclovir (ZOVIRAX) 400 MG tablet Take 1 tablet (400 mg total) by mouth 2 (two) times daily. 60 tablet 2   amLODipine (NORVASC) 10 MG tablet Take 10 mg by mouth daily.  b complex vitamins capsule Take 1 capsule by mouth daily.     dicyclomine (BENTYL) 10 MG capsule Take 1 capsule (10 mg total) by mouth 3 (three) times daily as needed for spasms. 90 capsule 0   gemfibrozil (LOPID) 600 MG tablet Take 600 mg by mouth 2 (two) times daily before a meal.     HYDROcodone-acetaminophen (NORCO/VICODIN) 5-325 MG tablet TAKE ONE TABLET EVERY 6 HOURS AS NEEDED FOR MODERATE PAIN 30 tablet 0   levothyroxine (SYNTHROID) 50 MCG tablet Take 25 mcg by mouth daily.     LORazepam (ATIVAN) 0.5 MG tablet Take 0.5 mg by mouth daily as needed for anxiety or sleep.     losartan (COZAAR) 50 MG tablet Take 50 mg by mouth daily.     Multiple Vitamin (MULTIVITAMIN) capsule Take 1 capsule by mouth daily.     nitrofurantoin, macrocrystal-monohydrate, (MACROBID) 100 MG capsule Take 100 mg by mouth at bedtime.     ondansetron (ZOFRAN) 8 MG tablet Take 1 tablet (8 mg total) by mouth every 8 (eight) hours as needed. 30 tablet 0   pantoprazole (PROTONIX) 20 MG tablet Take 20 mg by mouth daily.     polyethylene glycol powder (GLYCOLAX/MIRALAX) 17 GM/SCOOP powder Take 17 g by mouth at bedtime.     pregabalin (LYRICA) 25 MG capsule Take 1 capsule (25 mg total) by mouth 2 (two) times daily. 60 capsule 1   No current facility-administered medications for this visit.     REVIEW OF SYSTEMS:   Constitutional: ( - ) fevers, ( - )  chills , ( - ) night sweats Eyes: ( - ) blurriness of vision, ( - ) double vision, ( - ) watery eyes Ears, nose, mouth, throat, and face: ( - ) mucositis, ( - ) sore throat Respiratory: ( - ) cough, ( - ) dyspnea, ( - ) wheezes Cardiovascular: ( - ) palpitation, ( - ) chest discomfort, ( - ) lower extremity swelling Gastrointestinal:  ( - ) nausea, ( - ) heartburn, ( - ) change in bowel habits Skin: ( - ) abnormal skin rashes Lymphatics: ( - ) new lymphadenopathy, ( - ) easy bruising Neurological: (+ ) numbness, ( +) tingling, ( - ) new weaknesses Behavioral/Psych: ( - ) mood change, ( - ) new changes  All other systems were reviewed with the patient and are negative.  PHYSICAL EXAMINATION: ECOG PERFORMANCE STATUS: 1 - Symptomatic but completely ambulatory  Vitals:   08/01/22 1110  BP: (!) 146/59  Pulse: 72  Resp: 18  Temp: 97.6 F (36.4 C)  SpO2: 99%    Filed Weights   08/01/22 1110  Weight: 110 lb 6 oz (50.1 kg)   GENERAL: Well-appearing elderly Caucasian female, alert, no distress and comfortable SKIN: skin color, texture, turgor are normal, no rashes or significant lesions EYES: conjunctiva are pink and non-injected, sclera clear LUNGS: clear to auscultation and percussion with normal breathing effort HEART: regular rate & rhythm and no murmurs and no lower extremity edema Musculoskeletal: no cyanosis of digits and no clubbing  PSYCH: alert & oriented x 3, fluent speech NEURO: no focal motor/sensory deficits  LABORATORY DATA:  I have reviewed the data as listed    Latest Ref Rng & Units 08/01/2022   10:54 AM 07/18/2022    9:23 AM 07/04/2022    9:56 AM  CBC  WBC 4.0 - 10.5 K/uL 4.7  4.1  7.1   Hemoglobin 12.0 - 15.0 g/dL 10.6  10.4  10.3  Hematocrit 36.0 - 46.0 % 30.6  30.4  29.5   Platelets 150 - 400 K/uL 280  265  277        Latest Ref Rng & Units 08/01/2022   10:54 AM 07/18/2022    9:23 AM  07/04/2022    9:56 AM  CMP  Glucose 70 - 99 mg/dL 102  83  86   BUN 8 - 23 mg/dL _0 Creatinine 0.44 - 1.00 mg/dL 0.76  0.57  0.83   Sodium 135 - 145 mmol/L 134  134  134   Potassium 3.5 - 5.1 mmol/L 4.1  3.7  3.9   Chloride 98 - 111 mmol/L 104  104  103   CO2 22 - 32 mmol/L _1 Calcium 8.9 - 10.3 mg/dL 9.4  9.4  9.5   Total Protein 6.5 - 8.1 g/dL 7.3  6.9  7.0   Total Bilirubin 0.3 - 1.2 mg/dL 0.4  0.3  0.4   Alkaline Phos 38 - 126 U/L 94  86  87   AST 15 - 41 U/L _2 ALT 0 - 44 U/L _3 Lab Results  Component Value Date   MPROTEIN 0.2 (H) 07/18/2022   MPROTEIN 0.2 (H) 07/04/2022   MPROTEIN 0.2 (H) 06/20/2022   Lab Results  Component Value Date   KPAFRELGTCHN 8.7 07/18/2022   KPAFRELGTCHN 10.9 07/04/2022   KPAFRELGTCHN 9.4 06/20/2022   LAMBDASER 5.7 07/18/2022   LAMBDASER 6.9 07/04/2022   LAMBDASER 7.4 06/20/2022   KAPLAMBRATIO 1.53 07/18/2022   KAPLAMBRATIO 1.58 07/04/2022   KAPLAMBRATIO 1.27 06/20/2022   RADIOGRAPHIC STUDIES: No results found.  ASSESSMENT & PLAN [name] Page 88 y.o. female with medical history significant for newly diagnosed IgG lambda multiple myeloma who presents for a follow up visit.  #IgG Lambda Multiple Myeloma -- Diagnosis confirmed by bone marrow biopsy on 09/27/2021 which shows evidence of 20% involvement by plasma cells.  Additionally has anemia, macrocytosis, leukopenia, and concern for lytic lesions on bone imaging --Prognostic panel shows a deletion 17 with a TP53 mutation --Started Cycle 1 Day 1 of Velcade/Dex on 10/18/2021 --Switched to Dara/Dex on 03/14/2022 due to neuropathy secondary to Velcade.  Plan: --Due for Cycle 6 Day 8 of Dara/Dex today  --Labs from 07/04/2022 showed M protein 0.2, kappa and lambda light chains normal. Today's myeloma labs pending.  --Labs today show white blood cell count 4.7, hemoglobin 10.6, MCV 91.6, and platelets of 280. Cr 0.76, Calcium 9.4.  --if Hgb remains  consistently >10 can consider the addition of revlimid.  --Continue with biweekly labs, follow up visit and Dara/Dex treatments.   # H/O Confusion --decreased dexamethasone to 20 mg on infusion days.  --Symptoms resolved.   #Leukopenia/Macrocytic anemia/Vitamin B12 deficiency: --Secondary to chemotherapy and multiple myeloma --Received vitamin B12 injection 1000 mcg weekly x 4 doses. Continue q 4 weeks.   #Right rib pain, improved: --CT chest from 10/25/2021 that didn't show any new or progressive disease.   #Abdominal cramping--resolved: --Symptoms occur after velcade injection.  --Has Bentyl as needed  #Neuropathy: --Grade 2 involving finger tips and feet --Secondary to velcade  --Currently on Lyrica 25 mg PO BID. Sent refill.   #Supportive Care -- chemotherapy education complete -- port placement not required.  -- zofran 17m q8H PRN and compazine 161mPO q6H for nausea -- acyclovir 40063mO BID for VCZ prophylaxis. Patient  was not taking medication as prescribed.  -- Await dental clearance to start Zometa  No orders of the defined types were placed in this encounter.  All questions were answered. The patient knows to call the clinic with any problems, questions or concerns.  I have spent a total of 30 minutes minutes of face-to-face and non-face-to-face time, preparing to see the patient, performing a medically appropriate examination, counseling and educating the patient, ordering medications, documenting clinical information in the electronic health record,  and care coordination.   Ledell Peoples, MD Department of Hematology/Oncology [place] [facility] Phone: [phone] Pager: [phone] Email: [email or web address]    08/04/2022 5:54 PM

## 2022-08-04 ENCOUNTER — Encounter: Payer: Self-pay | Admitting: Hematology and Oncology

## 2022-08-05 LAB — KAPPA/LAMBDA LIGHT CHAINS
Kappa free light chain: 7.5 mg/L (ref 3.3–19.4)
Kappa, lambda light chain ratio: 1.19 (ref 0.26–1.65)
Lambda free light chains: 6.3 mg/L (ref 5.7–26.3)

## 2022-08-07 LAB — MULTIPLE MYELOMA PANEL, SERUM
Albumin SerPl Elph-Mcnc: 3.8 g/dL (ref 2.9–4.4)
Albumin/Glob SerPl: 1.4 (ref 0.7–1.7)
Alpha 1: 0.3 g/dL (ref 0.0–0.4)
Alpha2 Glob SerPl Elph-Mcnc: 1 g/dL (ref 0.4–1.0)
B-Globulin SerPl Elph-Mcnc: 1.3 g/dL (ref 0.7–1.3)
Gamma Glob SerPl Elph-Mcnc: 0.3 g/dL — ABNORMAL LOW (ref 0.4–1.8)
Globulin, Total: 2.9 g/dL (ref 2.2–3.9)
IgA: 14 mg/dL — ABNORMAL LOW (ref 64–422)
IgG (Immunoglobin G), Serum: 714 mg/dL (ref 586–1602)
IgM (Immunoglobulin M), Srm: 19 mg/dL — ABNORMAL LOW (ref 26–217)
M Protein SerPl Elph-Mcnc: 0.2 g/dL — ABNORMAL HIGH
Total Protein ELP: 6.7 g/dL (ref 6.0–8.5)

## 2022-08-12 ENCOUNTER — Other Ambulatory Visit: Payer: Self-pay | Admitting: Hematology and Oncology

## 2022-08-12 ENCOUNTER — Telehealth: Payer: Self-pay

## 2022-08-12 MED ORDER — HYDROCODONE-ACETAMINOPHEN 5-325 MG PO TABS: ORAL_TABLET | ORAL | 0 refills | Status: DC

## 2022-08-12 NOTE — Telephone Encounter (Signed)
Husband called requesting refill of Norco/Vicodin. Last filled 07/18/22 for 30 tablets.

## 2022-08-15 ENCOUNTER — Inpatient Hospital Stay: Payer: Medicare PPO | Admitting: Hematology and Oncology

## 2022-08-15 ENCOUNTER — Inpatient Hospital Stay: Payer: Medicare PPO

## 2022-08-15 ENCOUNTER — Inpatient Hospital Stay: Payer: Medicare PPO | Attending: Hematology and Oncology

## 2022-08-15 VITALS — BP 159/65 | HR 66 | Temp 97.9°F | Resp 15 | Wt 111.2 lb

## 2022-08-15 DIAGNOSIS — T451X5D Adverse effect of antineoplastic and immunosuppressive drugs, subsequent encounter: Secondary | ICD-10-CM | POA: Insufficient documentation

## 2022-08-15 DIAGNOSIS — G620 Drug-induced polyneuropathy: Secondary | ICD-10-CM | POA: Insufficient documentation

## 2022-08-15 DIAGNOSIS — Z800 Family history of malignant neoplasm of digestive organs: Secondary | ICD-10-CM | POA: Insufficient documentation

## 2022-08-15 DIAGNOSIS — M545 Low back pain, unspecified: Secondary | ICD-10-CM | POA: Insufficient documentation

## 2022-08-15 DIAGNOSIS — C9000 Multiple myeloma not having achieved remission: Secondary | ICD-10-CM | POA: Insufficient documentation

## 2022-08-15 DIAGNOSIS — Z79899 Other long term (current) drug therapy: Secondary | ICD-10-CM | POA: Insufficient documentation

## 2022-08-15 DIAGNOSIS — Z79891 Long term (current) use of opiate analgesic: Secondary | ICD-10-CM | POA: Insufficient documentation

## 2022-08-15 DIAGNOSIS — E538 Deficiency of other specified B group vitamins: Secondary | ICD-10-CM | POA: Diagnosis present

## 2022-08-15 DIAGNOSIS — Z5112 Encounter for antineoplastic immunotherapy: Secondary | ICD-10-CM | POA: Insufficient documentation

## 2022-08-15 LAB — CBC WITH DIFFERENTIAL (CANCER CENTER ONLY)
Abs Immature Granulocytes: 0 10*3/uL (ref 0.00–0.07)
Basophils Absolute: 0 10*3/uL (ref 0.0–0.1)
Basophils Relative: 1 %
Eosinophils Absolute: 0.1 10*3/uL (ref 0.0–0.5)
Eosinophils Relative: 3 %
HCT: 31.4 % — ABNORMAL LOW (ref 36.0–46.0)
Hemoglobin: 10.8 g/dL — ABNORMAL LOW (ref 12.0–15.0)
Immature Granulocytes: 0 %
Lymphocytes Relative: 29 %
Lymphs Abs: 1.2 10*3/uL (ref 0.7–4.0)
MCH: 31.8 pg (ref 26.0–34.0)
MCHC: 34.4 g/dL (ref 30.0–36.0)
MCV: 92.4 fL (ref 80.0–100.0)
Monocytes Absolute: 0.4 10*3/uL (ref 0.1–1.0)
Monocytes Relative: 10 %
Neutro Abs: 2.4 10*3/uL (ref 1.7–7.7)
Neutrophils Relative %: 57 %
Platelet Count: 240 10*3/uL (ref 150–400)
RBC: 3.4 MIL/uL — ABNORMAL LOW (ref 3.87–5.11)
RDW: 14.6 % (ref 11.5–15.5)
WBC Count: 4.1 10*3/uL (ref 4.0–10.5)
nRBC: 0 % (ref 0.0–0.2)

## 2022-08-15 LAB — CMP (CANCER CENTER ONLY)
ALT: 9 U/L (ref 0–44)
AST: 18 U/L (ref 15–41)
Albumin: 4.4 g/dL (ref 3.5–5.0)
Alkaline Phosphatase: 100 U/L (ref 38–126)
Anion gap: 8 (ref 5–15)
BUN: 13 mg/dL (ref 8–23)
CO2: 23 mmol/L (ref 22–32)
Calcium: 9.4 mg/dL (ref 8.9–10.3)
Chloride: 104 mmol/L (ref 98–111)
Creatinine: 0.75 mg/dL (ref 0.44–1.00)
GFR, Estimated: >60 mL/min (ref >60)
Glucose, Bld: 85 mg/dL (ref 70–99)
Potassium: 4.1 mmol/L (ref 3.5–5.1)
Sodium: 135 mmol/L (ref 135–145)
Total Bilirubin: 0.3 mg/dL (ref 0.3–1.2)
Total Protein: 6.8 g/dL (ref 6.5–8.1)

## 2022-08-15 LAB — LACTATE DEHYDROGENASE: LDH: 149 U/L (ref 98–192)

## 2022-08-15 MED ORDER — ACETAMINOPHEN 325 MG PO TABS
650.0000 mg | ORAL_TABLET | Freq: Once | ORAL | Status: AC
  Administered 2022-08-15: 650 mg via ORAL
  Filled 2022-08-15: qty 2

## 2022-08-15 MED ORDER — DARATUMUMAB-HYALURONIDASE-FIHJ 1800-30000 MG-UT/15ML ~~LOC~~ SOLN
1800.0000 mg | Freq: Once | SUBCUTANEOUS | Status: AC
  Administered 2022-08-15: 1800 mg via SUBCUTANEOUS
  Filled 2022-08-15: qty 15

## 2022-08-15 MED ORDER — DEXAMETHASONE 4 MG PO TABS
20.0000 mg | ORAL_TABLET | Freq: Once | ORAL | Status: AC
  Administered 2022-08-15: 20 mg via ORAL
  Filled 2022-08-15: qty 5

## 2022-08-15 MED ORDER — DIPHENHYDRAMINE HCL 25 MG PO CAPS
50.0000 mg | ORAL_CAPSULE | Freq: Once | ORAL | Status: AC
  Administered 2022-08-15: 50 mg via ORAL
  Filled 2022-08-15: qty 2

## 2022-08-15 NOTE — Patient Instructions (Signed)
[facility]  Discharge Instructions: Thank you for choosing [facility] to provide your oncology and hematology care.   If you have a lab appointment with the [facility], please go directly to the [facility] and check in at the registration area.   Wear comfortable clothing and clothing appropriate for easy access to any Portacath or PICC line.   We strive to give you quality time with your provider. You may need to reschedule your appointment if you arrive late (15 or more minutes).  Arriving late affects you and other patients whose appointments are after yours.  Also, if you miss three or more appointments without notifying the office, you may be dismissed from the clinic at the provider's discretion.      For prescription refill requests, have your pharmacy contact our office and allow 72 hours for refills to be completed.    Today you received the following chemotherapy and/or immunotherapy agents darzalex faspro      To help prevent nausea and vomiting after your treatment, we encourage you to take your nausea medication as directed.  BELOW ARE SYMPTOMS THAT SHOULD BE REPORTED IMMEDIATELY: *FEVER GREATER THAN 100.4 F (38 C) OR HIGHER *CHILLS OR SWEATING *NAUSEA AND VOMITING THAT IS NOT CONTROLLED WITH YOUR NAUSEA MEDICATION *UNUSUAL SHORTNESS OF BREATH *UNUSUAL BRUISING OR BLEEDING *URINARY PROBLEMS (pain or burning when urinating, or frequent urination) *BOWEL PROBLEMS (unusual diarrhea, constipation, pain near the anus) TENDERNESS IN MOUTH AND THROAT WITH OR WITHOUT PRESENCE OF ULCERS (sore throat, sores in mouth, or a toothache) UNUSUAL RASH, SWELLING OR PAIN  UNUSUAL VAGINAL DISCHARGE OR ITCHING   Items with * indicate a potential emergency and should be followed up as soon as possible or go to the Emergency Department if any problems should occur.  Please show the CHEMOTHERAPY ALERT CARD or IMMUNOTHERAPY ALERT CARD at  check-in to the Emergency Department and triage nurse.  Should you have questions after your visit or need to cancel or reschedule your appointment, please contact [facility]  Dept: [phone]  and follow the prompts.  Office hours are 8:00 a.m. to 4:30 p.m. Monday - Friday. Please note that voicemails left after 4:00 p.m. may not be returned until the following business day.  We are closed weekends and major holidays. You have access to a nurse at all times for urgent questions. Please call the main number to the clinic Dept: [phone] and follow the prompts.   For any non-urgent questions, you may also contact your provider using MyChart. We now offer e-Visits for anyone 84 and older to request care online for non-urgent symptoms. For details visit [email or web address].   Also download the MyChart app! Go to the app store, search "MyChart", open the app, select [facility], and log in with your MyChart username and password.

## 2022-08-15 NOTE — Progress Notes (Signed)
[facility] [place] [phone]   [phone]  PROGRESS NOTE  Patient Care Team: Burnard Bunting, MD as PCP - General (Internal Medicine)  Hematological/Oncological History # IgG Lambda Multiple Myeloma 06/04/2021: WBC 5.5, Hgb 11.1, MCV 101.3, Plt 306 07/24/2021: M protein 2.3, IFE shows IgG Lambda monoclonal specificity. Cr 0.9 08/07/2021: Establish care with Dr. Lorenso Courier 09/27/2021: Bmbx showed hypercellular bone marrow with plasma cell neoplasm, increased number of atypical plasma cells averaging 20% of all cells in the aspirate 10/18/2021: Cycle 1 of Velcade/Dexamethasone 11/08/2021: Cycle 2 of Velcade/Dexamethasone 11/29/2021: Cycle 3 of Velcade/Dexamethasone 12/20/2021: Cycle 4 of Velcade/Dexamethasone 01/10/2022: Cycle 5 of Velcade/Dexamethasone 01/31/2022: Cycle 6 of Velcade/Dexamethasone 02/28/2022: Cycle 7 of Velcade/Dexamethasone. Dose reduced velcade due to neuropathy  03/14/2022: Cycle 1 Day 1 of Dara/Dex (velcade d/c due to neuropathy).  04/11/2022: Cycle 2 Day 1 of Dara/Dex 05/09/2022: Cycle 3 Day 1 of Dara/Dex 06/06/2022: Cycle 4 Day 1 of Dara/Dex 07/04/2022: Cycle 5 Day 1 of Dara/Dex 08/01/2022: Cycle 6 Day 1 of Dara/Dex  Interval History:  [name] 89 y.o. female with medical history significant for  IgG lambda multiple myeloma who presents for a follow up visit. The patient's last visit was on 08/01/2022. In the interim, patient has had no major changes in her health. She is due for Cycle 6, Day 15.  On exam today Mrs. Janowiak reports she has been quite well in the interim since her last visit.  Her pain has not been as severe and her weight is improving.  She notes that her appetite is strong and she is eating well.  She notes that she does not need as much rest.  She reports that she is optimistic about the future and feels like her primary problem at this time is her balance is getting worse.  She is concerned this may be due to "old age".   She notes she has not had any falls.  She has not been having any trouble with itching, rash, or redness at the site of her injections.  She notes that otherwise she is willing and able to proceed with treatment at this time.  She denies fevers, chills, night sweats, shortness of breath, chest pain or cough.  She has no other complaints. Rest of the 10 point ROS is below.  MEDICAL HISTORY:  Past Medical History:  Diagnosis Date   Alopecia 10/28/2016   Anginal pain (HCC)    Arthritis    Cervical spondylosis    C3-4 AND C4-5   Chest pain 07/28/2008   H/O, normal stress nuclear EF 78%   Chronic cough 07/12/2014   Collapsed lung 07/2010   being treated at [facility] - in left lung   Complication of anesthesia    Cough 07/30/2010   Qualifier: Diagnosis of  By: Chase Caller MD, Murali     Cystocele 05/23/2015   Cystocele, midline 05/02/2013   DEEP VEIN THROMBOSIS/PHLEBITIS 07/30/2010   Qualifier: History of  By: Harvest Dark CMA, Jennifer     Dislocation closed, shoulder 02/2013   DVT (deep venous thrombosis) ([place]) 2006   Encounter for therapeutic drug monitoring 10/03/2014   GERD (gastroesophageal reflux disease)    occ   Headache    hx   High cholesterol    History of recurrent UTIs 09/24/2011   HYPERLIPIDEMIA 07/30/2010   Qualifier: Diagnosis of  By: Harvest Dark CMA, Jennifer     Hypertension    HYPERTENSION 07/30/2010   Qualifier: Diagnosis of  By: Harvest Dark CMA, Anderson Malta  Hypothyroidism    Nasal itching 01/30/2014   Osteopenia 05/23/2015   Peripheral vascular disease ([place]) 2006   dvt's and 50 yrs ago   Pleuritic pain 12/17/2015   PONV (postoperative nausea and vomiting)    Primary localized osteoarthritis of right hip 08/26/2016   Primary osteoarthritis of right hip 08/26/2016   Pulmonary collapse 07/30/2010   Qualifier: Diagnosis of  By: Chase Caller MD, Murali     PULMONARY NODULE 07/30/2010   Qualifier: Diagnosis of  By: Chase Caller MD, Murali     Rash and nonspecific skin eruption  01/02/2015   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   Sarcoidosis    Sarcoidosis of lung ([place])    Vaginal atrophy 09/24/2011   Visual field defect nasal step 01/02/2015    SURGICAL HISTORY: Past Surgical History:  Procedure Laterality Date   BACK SURGERY  2022   EYE SURGERY     FOOT SURGERY Left 06/2005   bunion hammer toe   HEMORRHOID SURGERY  12/1972   INTRAOCULAR LENS INSERTION     right eye 10 /16 and left 06/16/2005   JOINT REPLACEMENT     KYPHOPLASTY N/A 06/27/2021   Procedure: LUMBAR 3 AND LUMBAR  5 KYPHOPLASTY;  Surgeon: Phylliss Bob, MD;  Location: [facility];  Service: Orthopedics;  Laterality: N/A;   KYPHOPLASTY N/A 09/04/2021   Procedure: THORACIC SEVEN, THORACIC EIGHT, THORACIC NINE, THORACIC TEN KYPHOPLASTY;  Surgeon: Phylliss Bob, MD;  Location: [facility];  Service: Orthopedics;  Laterality: N/A;   SKIN BIOPSY  01/2007   basal cell carcinoma removed from right lower leg    TOTAL HIP ARTHROPLASTY  06/2007   left   TOTAL HIP ARTHROPLASTY Right 08/26/2016   Procedure: TOTAL HIP ARTHROPLASTY ANTERIOR APPROACH;  Surgeon: Melrose Nakayama, MD;  Location: [facility];  Service: Orthopedics;  Laterality: Right;   TOTAL KNEE ARTHROPLASTY Right 10/2007   right    SOCIAL HISTORY: Social History   Socioeconomic History   Marital status: Married    Spouse name: Not on file   Number of children: Not on file   Years of education: Not on file   Highest education level: Not on file  Occupational History   Occupation: retired    Fish farm manager: RETIRED  Tobacco Use   Smoking status: Never   Smokeless tobacco: Never  Vaping Use   Vaping Use: Never used  Substance and Sexual Activity   Alcohol use: Not Currently    Comment: BEER/glass of wine A DAY   Drug use: No   Sexual activity: Never  Other Topics Concern   Not on file  Social History Narrative   Not on file   Social Determinants of Health   Financial Resource Strain: Not on file  Food Insecurity: Not on file  Transportation  Needs: Not on file  Physical Activity: Not on file  Stress: Not on file  Social Connections: Not on file  Intimate Partner Violence: Not on file    FAMILY HISTORY: Family History  Problem Relation Age of Onset   Diabetes Mother    Heart failure Mother    Hypertension Father    Diabetes Father    Heart disease Father    Heart attack Father     ALLERGIES:  has No Known Allergies.  MEDICATIONS:  Current Outpatient Medications  Medication Sig Dispense Refill   acyclovir (ZOVIRAX) 400 MG tablet Take 1 tablet (400 mg total) by mouth 2 (two) times daily. 60 tablet 2   amLODipine (NORVASC) 10 MG tablet Take 10 mg  by mouth daily.     b complex vitamins capsule Take 1 capsule by mouth daily.     dicyclomine (BENTYL) 10 MG capsule Take 1 capsule (10 mg total) by mouth 3 (three) times daily as needed for spasms. 90 capsule 0   gemfibrozil (LOPID) 600 MG tablet Take 600 mg by mouth 2 (two) times daily before a meal.     HYDROcodone-acetaminophen (NORCO/VICODIN) 5-325 MG tablet TAKE ONE TABLET EVERY 6 HOURS AS NEEDED FOR MODERATE PAIN 30 tablet 0   levothyroxine (SYNTHROID) 50 MCG tablet Take 25 mcg by mouth daily.     LORazepam (ATIVAN) 0.5 MG tablet Take 0.5 mg by mouth daily as needed for anxiety or sleep.     losartan (COZAAR) 50 MG tablet Take 50 mg by mouth daily.     Multiple Vitamin (MULTIVITAMIN) capsule Take 1 capsule by mouth daily.     nitrofurantoin, macrocrystal-monohydrate, (MACROBID) 100 MG capsule Take 100 mg by mouth at bedtime.     ondansetron (ZOFRAN) 8 MG tablet Take 1 tablet (8 mg total) by mouth every 8 (eight) hours as needed. 30 tablet 0   pantoprazole (PROTONIX) 20 MG tablet Take 20 mg by mouth daily.     polyethylene glycol powder (GLYCOLAX/MIRALAX) 17 GM/SCOOP powder Take 17 g by mouth at bedtime.     pregabalin (LYRICA) 25 MG capsule Take 1 capsule (25 mg total) by mouth 2 (two) times daily. 60 capsule 1   No current facility-administered medications for this  visit.    REVIEW OF SYSTEMS:   Constitutional: ( - ) fevers, ( - )  chills , ( - ) night sweats Eyes: ( - ) blurriness of vision, ( - ) double vision, ( - ) watery eyes Ears, nose, mouth, throat, and face: ( - ) mucositis, ( - ) sore throat Respiratory: ( - ) cough, ( - ) dyspnea, ( - ) wheezes Cardiovascular: ( - ) palpitation, ( - ) chest discomfort, ( - ) lower extremity swelling Gastrointestinal:  ( - ) nausea, ( - ) heartburn, ( - ) change in bowel habits Skin: ( - ) abnormal skin rashes Lymphatics: ( - ) new lymphadenopathy, ( - ) easy bruising Neurological: (+ ) numbness, ( +) tingling, ( - ) new weaknesses Behavioral/Psych: ( - ) mood change, ( - ) new changes  All other systems were reviewed with the patient and are negative.  PHYSICAL EXAMINATION: ECOG PERFORMANCE STATUS: 1 - Symptomatic but completely ambulatory  Vitals:   08/15/22 1122  BP: (!) 159/65  Pulse: 66  Resp: 15  Temp: 97.9 F (36.6 C)  SpO2: 100%    Filed Weights   08/15/22 1122  Weight: 111 lb 3.2 oz (50.4 kg)   GENERAL: Well-appearing elderly Caucasian female, alert, no distress and comfortable SKIN: skin color, texture, turgor are normal, no rashes or significant lesions EYES: conjunctiva are pink and non-injected, sclera clear LUNGS: clear to auscultation and percussion with normal breathing effort HEART: regular rate & rhythm and no murmurs and no lower extremity edema Musculoskeletal: no cyanosis of digits and no clubbing  PSYCH: alert & oriented x 3, fluent speech NEURO: no focal motor/sensory deficits  LABORATORY DATA:  I have reviewed the data as listed    Latest Ref Rng & Units 08/15/2022   10:38 AM 08/01/2022   10:54 AM 07/18/2022    9:23 AM  CBC  WBC 4.0 - 10.5 K/uL 4.1  4.7  4.1   Hemoglobin 12.0 - 15.0 g/dL 10.8  10.6  10.4   Hematocrit 36.0 - 46.0 % 31.4  30.6  30.4   Platelets 150 - 400 K/uL 240  280  265        Latest Ref Rng & Units 08/15/2022   10:38 AM 08/01/2022   10:54  AM 07/18/2022    9:23 AM  CMP  Glucose 70 - 99 mg/dL 85  102  83   BUN 8 - 23 mg/dL '13  12  17   '$ Creatinine 0.44 - 1.00 mg/dL 0.75  0.76  0.57   Sodium 135 - 145 mmol/L 135  134  134   Potassium 3.5 - 5.1 mmol/L 4.1  4.1  3.7   Chloride 98 - 111 mmol/L 104  104  104   CO2 22 - 32 mmol/L '23  23  23   '$ Calcium 8.9 - 10.3 mg/dL 9.4  9.4  9.4   Total Protein 6.5 - 8.1 g/dL 6.8  7.3  6.9   Total Bilirubin 0.3 - 1.2 mg/dL 0.3  0.4  0.3   Alkaline Phos 38 - 126 U/L 100  94  86   AST 15 - 41 U/L '18  18  17   '$ ALT 0 - 44 U/L '9  7  8     '$ Lab Results  Component Value Date   MPROTEIN 0.2 (H) 08/01/2022   MPROTEIN 0.2 (H) 07/18/2022   MPROTEIN 0.2 (H) 07/04/2022   Lab Results  Component Value Date   KPAFRELGTCHN 7.5 08/01/2022   KPAFRELGTCHN 8.7 07/18/2022   KPAFRELGTCHN 10.9 07/04/2022   LAMBDASER 6.3 08/01/2022   LAMBDASER 5.7 07/18/2022   LAMBDASER 6.9 07/04/2022   KAPLAMBRATIO 1.19 08/01/2022   KAPLAMBRATIO 1.53 07/18/2022   KAPLAMBRATIO 1.58 07/04/2022   RADIOGRAPHIC STUDIES: No results found.  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  #IgG Lambda Multiple Myeloma -- Diagnosis confirmed by bone marrow biopsy on 09/27/2021 which shows evidence of 20% involvement by plasma cells.  Additionally has anemia, macrocytosis, leukopenia, and concern for lytic lesions on bone imaging --Prognostic panel shows a deletion 17 with a TP53 mutation --Started Cycle 1 Day 1 of Velcade/Dex on 10/18/2021 --Switched to Dara/Dex on 03/14/2022 due to neuropathy secondary to Velcade.  Plan: --Due for Cycle 6 Day 15 of Dara/Dex today  --Labs from 07/04/2022 showed M protein 0.2, kappa and lambda light chains normal. Today's myeloma labs pending.  --Labs today show white blood cell count 4.1, hemoglobin 10.8, MCV 92.4, and platelets of 240. --if Hgb remains consistently >10 can  consider the addition of revlimid.  --Continue with biweekly labs, follow up visit and Dara/Dex treatments.   # H/O Confusion --decreased dexamethasone to 20 mg on infusion days.  --Symptoms resolved.   #Leukopenia/Macrocytic anemia/Vitamin B12 deficiency: --Secondary to chemotherapy and multiple myeloma --Received vitamin B12 injection 1000 mcg weekly x 4 doses. Continue q 4 weeks.   #Right rib pain, improved: --CT chest from 10/25/2021 that didn't show any new or progressive disease.   #Abdominal cramping--resolved: --Symptoms occur after velcade injection.  --Has Bentyl as needed  #Neuropathy: --Grade 2 involving finger tips and feet --Secondary to velcade  --Currently on Lyrica 25 mg PO BID. Sent refill.   #Supportive Care -- chemotherapy education complete -- port placement not required.  -- zofran '8mg'$  q8H PRN and compazine '10mg'$  PO q6H for nausea -- acyclovir '400mg'$  PO BID for VCZ prophylaxis. Patient was  not taking medication as prescribed.  -- Await dental clearance to start Zometa  No orders of the defined types were placed in this encounter.  All questions were answered. The patient knows to call the clinic with any problems, questions or concerns.  I have spent a total of 30 minutes minutes of face-to-face and non-face-to-face time, preparing to see the patient, performing a medically appropriate examination, counseling and educating the patient, ordering medications, documenting clinical information in the electronic health record,  and care coordination.   Ledell Peoples, MD Department of Hematology/Oncology [place] [facility] Phone: [phone] Pager: [phone] Email: [email or web address]    08/15/2022 11:25 AM

## 2022-08-16 LAB — BETA 2 MICROGLOBULIN, SERUM: Beta-2 Microglobulin: 2.2 mg/L (ref 0.6–2.4)

## 2022-08-18 LAB — KAPPA/LAMBDA LIGHT CHAINS
Kappa free light chain: 8.7 mg/L (ref 3.3–19.4)
Kappa, lambda light chain ratio: 1.36 (ref 0.26–1.65)
Lambda free light chains: 6.4 mg/L (ref 5.7–26.3)

## 2022-08-20 LAB — MULTIPLE MYELOMA PANEL, SERUM
Albumin SerPl Elph-Mcnc: 3.9 g/dL (ref 2.9–4.4)
Albumin/Glob SerPl: 1.5 (ref 0.7–1.7)
Alpha 1: 0.3 g/dL (ref 0.0–0.4)
Alpha2 Glob SerPl Elph-Mcnc: 0.9 g/dL (ref 0.4–1.0)
B-Globulin SerPl Elph-Mcnc: 1.2 g/dL (ref 0.7–1.3)
Gamma Glob SerPl Elph-Mcnc: 0.4 g/dL (ref 0.4–1.8)
Globulin, Total: 2.7 g/dL (ref 2.2–3.9)
IgA: 15 mg/dL — ABNORMAL LOW (ref 64–422)
IgG (Immunoglobin G), Serum: 728 mg/dL (ref 586–1602)
IgM (Immunoglobulin M), Srm: 20 mg/dL — ABNORMAL LOW (ref 26–217)
M Protein SerPl Elph-Mcnc: 0.2 g/dL — ABNORMAL HIGH
Total Protein ELP: 6.6 g/dL (ref 6.0–8.5)

## 2022-08-25 ENCOUNTER — Encounter: Payer: Self-pay | Admitting: Hematology and Oncology

## 2022-08-26 ENCOUNTER — Telehealth: Payer: Self-pay | Admitting: Hematology and Oncology

## 2022-08-26 NOTE — Telephone Encounter (Signed)
Called patient to confirm upcoming appointments. Patient notified.

## 2022-08-27 ENCOUNTER — Other Ambulatory Visit: Payer: Self-pay

## 2022-08-29 ENCOUNTER — Inpatient Hospital Stay: Payer: Medicare PPO | Admitting: Hematology and Oncology

## 2022-08-29 ENCOUNTER — Inpatient Hospital Stay: Payer: Medicare PPO

## 2022-08-29 VITALS — BP 147/71 | HR 73 | Temp 97.9°F | Resp 16 | Wt 112.4 lb

## 2022-08-29 DIAGNOSIS — D649 Anemia, unspecified: Secondary | ICD-10-CM

## 2022-08-29 DIAGNOSIS — Z5112 Encounter for antineoplastic immunotherapy: Secondary | ICD-10-CM | POA: Diagnosis not present

## 2022-08-29 DIAGNOSIS — C9000 Multiple myeloma not having achieved remission: Secondary | ICD-10-CM

## 2022-08-29 DIAGNOSIS — E538 Deficiency of other specified B group vitamins: Secondary | ICD-10-CM

## 2022-08-29 LAB — CBC WITH DIFFERENTIAL (CANCER CENTER ONLY)
Abs Immature Granulocytes: 0.01 10*3/uL (ref 0.00–0.07)
Basophils Absolute: 0 10*3/uL (ref 0.0–0.1)
Basophils Relative: 0 %
Eosinophils Absolute: 0.1 10*3/uL (ref 0.0–0.5)
Eosinophils Relative: 2 %
HCT: 30 % — ABNORMAL LOW (ref 36.0–46.0)
Hemoglobin: 10.5 g/dL — ABNORMAL LOW (ref 12.0–15.0)
Immature Granulocytes: 0 %
Lymphocytes Relative: 31 %
Lymphs Abs: 1.3 10*3/uL (ref 0.7–4.0)
MCH: 32.3 pg (ref 26.0–34.0)
MCHC: 35 g/dL (ref 30.0–36.0)
MCV: 92.3 fL (ref 80.0–100.0)
Monocytes Absolute: 0.4 10*3/uL (ref 0.1–1.0)
Monocytes Relative: 10 %
Neutro Abs: 2.4 10*3/uL (ref 1.7–7.7)
Neutrophils Relative %: 57 %
Platelet Count: 244 10*3/uL (ref 150–400)
RBC: 3.25 MIL/uL — ABNORMAL LOW (ref 3.87–5.11)
RDW: 14.8 % (ref 11.5–15.5)
WBC Count: 4.2 10*3/uL (ref 4.0–10.5)
nRBC: 0 % (ref 0.0–0.2)

## 2022-08-29 LAB — CMP (CANCER CENTER ONLY)
ALT: 8 U/L (ref 0–44)
AST: 17 U/L (ref 15–41)
Albumin: 4 g/dL (ref 3.5–5.0)
Alkaline Phosphatase: 81 U/L (ref 38–126)
Anion gap: 7 (ref 5–15)
BUN: 16 mg/dL (ref 8–23)
CO2: 22 mmol/L (ref 22–32)
Calcium: 9.3 mg/dL (ref 8.9–10.3)
Chloride: 105 mmol/L (ref 98–111)
Creatinine: 0.8 mg/dL (ref 0.44–1.00)
GFR, Estimated: >60 mL/min (ref >60)
Glucose, Bld: 107 mg/dL — ABNORMAL HIGH (ref 70–99)
Potassium: 4.2 mmol/L (ref 3.5–5.1)
Sodium: 134 mmol/L — ABNORMAL LOW (ref 135–145)
Total Bilirubin: 0.3 mg/dL (ref 0.3–1.2)
Total Protein: 6.5 g/dL (ref 6.5–8.1)

## 2022-08-29 LAB — LACTATE DEHYDROGENASE: LDH: 134 U/L (ref 98–192)

## 2022-08-29 MED ORDER — DARATUMUMAB-HYALURONIDASE-FIHJ 1800-30000 MG-UT/15ML ~~LOC~~ SOLN
1800.0000 mg | Freq: Once | SUBCUTANEOUS | Status: AC
  Administered 2022-08-29: 1800 mg via SUBCUTANEOUS
  Filled 2022-08-29: qty 15

## 2022-08-29 MED ORDER — DEXAMETHASONE 4 MG PO TABS
20.0000 mg | ORAL_TABLET | Freq: Once | ORAL | Status: AC
  Administered 2022-08-29: 20 mg via ORAL
  Filled 2022-08-29: qty 5

## 2022-08-29 MED ORDER — DIPHENHYDRAMINE HCL 25 MG PO CAPS
50.0000 mg | ORAL_CAPSULE | Freq: Once | ORAL | Status: AC
  Administered 2022-08-29: 50 mg via ORAL
  Filled 2022-08-29: qty 2

## 2022-08-29 MED ORDER — CYANOCOBALAMIN 1000 MCG/ML IJ SOLN
1000.0000 ug | Freq: Once | INTRAMUSCULAR | Status: AC
  Administered 2022-08-29: 1000 ug via INTRAMUSCULAR
  Filled 2022-08-29: qty 1

## 2022-08-29 MED ORDER — ACETAMINOPHEN 325 MG PO TABS
650.0000 mg | ORAL_TABLET | Freq: Once | ORAL | Status: AC
  Administered 2022-08-29: 650 mg via ORAL
  Filled 2022-08-29: qty 2

## 2022-08-29 NOTE — Progress Notes (Signed)
[place] [phone]   [phone]  PROGRESS NOTE  Patient Care Team: Burnard Bunting, MD as PCP - General (Internal Medicine)  Hematological/Oncological History # IgG Lambda Multiple Myeloma 06/04/2021: WBC 5.5, Hgb 11.1, MCV 101.3, Plt 306 07/24/2021: M protein 2.3, IFE shows IgG Lambda monoclonal specificity. Cr 0.9 08/07/2021: Establish care with Dr. Lorenso Courier 09/27/2021: Bmbx showed hypercellular bone marrow with plasma cell neoplasm, increased number of atypical plasma cells averaging 20% of all cells in the aspirate 10/18/2021: Cycle 1 of Velcade/Dexamethasone 11/08/2021: Cycle 2 of Velcade/Dexamethasone 11/29/2021: Cycle 3 of Velcade/Dexamethasone 12/20/2021: Cycle 4 of Velcade/Dexamethasone 01/10/2022: Cycle 5 of Velcade/Dexamethasone 01/31/2022: Cycle 6 of Velcade/Dexamethasone 02/28/2022: Cycle 7 of Velcade/Dexamethasone. Dose reduced velcade due to neuropathy  03/14/2022: Cycle 1 Day 1 of Dara/Dex (velcade d/c due to neuropathy).  04/11/2022: Cycle 2 Day 1 of Dara/Dex 05/09/2022: Cycle 3 Day 1 of Dara/Dex 06/06/2022: Cycle 4 Day 1 of Dara/Dex 07/04/2022: Cycle 5 Day 1 of Dara/Dex 08/01/2022: Cycle 6 Day 1 of Dara/Dex 08/29/2022: Cycle 7 Day 1 of Dara/Dex  Interval History:  Unk [name] Page 89 y.o. female with medical history significant for  IgG lambda multiple myeloma who presents for a follow up visit. The patient's last visit was on 08/15/2022. In the interim, patient has had no major changes in her health. She is due for Cycle 7, Day 1.  On exam today [name] Page reports she has been well overall in the interim since her last visit.  Her energy levels have been good and continuing to improve.  She has good energy in the mornings but tend to be more tired in the evenings.  She takes a 1 hour nap per day.  She notes that she is eating well and her weight is up to 112 pounds.  She notes that she is currently taking vitamin B12 supplementation pills  but has not received the shots in some time.  She notes that she is not having any side effects as result of the Darzalex shots.  She notes that she does have some occasional lower back pain for which she is taking her hydrocodone.  She takes maybe 1 to 3 pills/day, rarely taking up to 3.  Overall she is at her baseline level of health and willing and able to proceed with Darzalex shots today.  She denies fevers, chills, night sweats, shortness of breath, chest pain or cough.  She has no other complaints. Rest of the 10 point ROS is below.  MEDICAL HISTORY:  Past Medical History:  Diagnosis Date   Alopecia 10/28/2016   Anginal pain (HCC)    Arthritis    Cervical spondylosis    C3-4 AND C4-5   Chest pain 07/28/2008   H/O, normal stress nuclear EF 78%   Chronic cough 07/12/2014   Collapsed lung 07/2010   being treated at [facility] - in left lung   Complication of anesthesia    Cough 07/30/2010   Qualifier: Diagnosis of  By: Chase Caller MD, Murali     Cystocele 05/23/2015   Cystocele, midline 05/02/2013   DEEP VEIN THROMBOSIS/PHLEBITIS 07/30/2010   Qualifier: History of  By: Harvest Dark CMA, Jennifer     Dislocation closed, shoulder 02/2013   DVT (deep venous thrombosis) ([place]) 2006   Encounter for therapeutic drug monitoring 10/03/2014   GERD (gastroesophageal reflux disease)    occ   Headache    hx   High cholesterol    History of recurrent UTIs 09/24/2011   HYPERLIPIDEMIA 07/30/2010  Qualifier: Diagnosis of  By: Harvest Dark CMA, Jennifer     Hypertension    HYPERTENSION 07/30/2010   Qualifier: Diagnosis of  By: Harvest Dark CMA, Jennifer     Hypothyroidism    Nasal itching 01/30/2014   Osteopenia 05/23/2015   Peripheral vascular disease ([place]) 2006   dvt's and 50 yrs ago   Pleuritic pain 12/17/2015   PONV (postoperative nausea and vomiting)    Primary localized osteoarthritis of right hip 08/26/2016   Primary osteoarthritis of right hip 08/26/2016   Pulmonary collapse 07/30/2010    Qualifier: Diagnosis of  By: Chase Caller MD, Murali     PULMONARY NODULE 07/30/2010   Qualifier: Diagnosis of  By: Chase Caller MD, Murali     Rash and nonspecific skin eruption 01/02/2015   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   Sarcoidosis    Sarcoidosis of lung ([place])    Vaginal atrophy 09/24/2011   Visual field defect nasal step 01/02/2015    SURGICAL HISTORY: Past Surgical History:  Procedure Laterality Date   BACK SURGERY  2022   EYE SURGERY     FOOT SURGERY Left 06/2005   bunion hammer toe   HEMORRHOID SURGERY  12/1972   INTRAOCULAR LENS INSERTION     right eye 10 /16 and left 06/16/2005   JOINT REPLACEMENT     KYPHOPLASTY N/A 06/27/2021   Procedure: LUMBAR 3 AND LUMBAR  5 KYPHOPLASTY;  Surgeon: Phylliss Bob, MD;  Location: [place];  Service: Orthopedics;  Laterality: N/A;   KYPHOPLASTY N/A 09/04/2021   Procedure: THORACIC SEVEN, THORACIC EIGHT, THORACIC NINE, THORACIC TEN KYPHOPLASTY;  Surgeon: Phylliss Bob, MD;  Location: [place];  Service: Orthopedics;  Laterality: N/A;   SKIN BIOPSY  01/2007   basal cell carcinoma removed from right lower leg    TOTAL HIP ARTHROPLASTY  06/2007   left   TOTAL HIP ARTHROPLASTY Right 08/26/2016   Procedure: TOTAL HIP ARTHROPLASTY ANTERIOR APPROACH;  Surgeon: Melrose Nakayama, MD;  Location: [place];  Service: Orthopedics;  Laterality: Right;   TOTAL KNEE ARTHROPLASTY Right 10/2007   right    SOCIAL HISTORY: Social History   Socioeconomic History   Marital status: Married    Spouse name: Not on file   Number of children: Not on file   Years of education: Not on file   Highest education level: Not on file  Occupational History   Occupation: retired    Fish farm manager: RETIRED  Tobacco Use   Smoking status: Never   Smokeless tobacco: Never  Vaping Use   Vaping Use: Never used  Substance and Sexual Activity   Alcohol use: Not Currently    Comment: BEER/glass of wine A DAY   Drug use: No   Sexual activity: Never  Other Topics Concern    Not on file  Social History Narrative   Not on file   Social Determinants of Health   Financial Resource Strain: Not on file  Food Insecurity: Not on file  Transportation Needs: Not on file  Physical Activity: Not on file  Stress: Not on file  Social Connections: Not on file  Intimate Partner Violence: Not on file    FAMILY HISTORY: Family History  Problem Relation Age of Onset   Diabetes Mother    Heart failure Mother    Hypertension Father    Diabetes Father    Heart disease Father    Heart attack Father     ALLERGIES:  has No Known Allergies.  MEDICATIONS:  Current Outpatient Medications  Medication Sig Dispense Refill  acyclovir (ZOVIRAX) 400 MG tablet Take 1 tablet (400 mg total) by mouth 2 (two) times daily. 60 tablet 2   amLODipine (NORVASC) 10 MG tablet Take 10 mg by mouth daily.     b complex vitamins capsule Take 1 capsule by mouth daily.     dicyclomine (BENTYL) 10 MG capsule Take 1 capsule (10 mg total) by mouth 3 (three) times daily as needed for spasms. 90 capsule 0   gemfibrozil (LOPID) 600 MG tablet Take 600 mg by mouth 2 (two) times daily before a meal.     HYDROcodone-acetaminophen (NORCO/VICODIN) 5-325 MG tablet TAKE ONE TABLET EVERY 6 HOURS AS NEEDED FOR MODERATE PAIN 30 tablet 0   levothyroxine (SYNTHROID) 50 MCG tablet Take 25 mcg by mouth daily.     LORazepam (ATIVAN) 0.5 MG tablet Take 0.5 mg by mouth daily as needed for anxiety or sleep.     losartan (COZAAR) 50 MG tablet Take 50 mg by mouth daily.     Multiple Vitamin (MULTIVITAMIN) capsule Take 1 capsule by mouth daily.     nitrofurantoin, macrocrystal-monohydrate, (MACROBID) 100 MG capsule Take 100 mg by mouth at bedtime.     ondansetron (ZOFRAN) 8 MG tablet Take 1 tablet (8 mg total) by mouth every 8 (eight) hours as needed. 30 tablet 0   pantoprazole (PROTONIX) 20 MG tablet Take 20 mg by mouth daily.     polyethylene glycol powder (GLYCOLAX/MIRALAX) 17 GM/SCOOP powder Take 17 g by mouth at  bedtime.     pregabalin (LYRICA) 25 MG capsule Take 1 capsule (25 mg total) by mouth 2 (two) times daily. 60 capsule 1   No current facility-administered medications for this visit.   Facility-Administered Medications Ordered in Other Visits  Medication Dose Route Frequency Provider Last Rate Last Admin   cyanocobalamin (VITAMIN B12) injection 1,000 mcg  1,000 mcg Intramuscular Once Orson Slick, MD       daratumumab-hyaluronidase-fihj (DARZALEX FASPRO) 1800-30000 MG-UT/15ML chemo SQ injection 1,800 mg  1,800 mg Subcutaneous Once Orson Slick, MD        REVIEW OF SYSTEMS:   Constitutional: ( - ) fevers, ( - )  chills , ( - ) night sweats Eyes: ( - ) blurriness of vision, ( - ) double vision, ( - ) watery eyes Ears, nose, mouth, throat, and face: ( - ) mucositis, ( - ) sore throat Respiratory: ( - ) cough, ( - ) dyspnea, ( - ) wheezes Cardiovascular: ( - ) palpitation, ( - ) chest discomfort, ( - ) lower extremity swelling Gastrointestinal:  ( - ) nausea, ( - ) heartburn, ( - ) change in bowel habits Skin: ( - ) abnormal skin rashes Lymphatics: ( - ) new lymphadenopathy, ( - ) easy bruising Neurological: (+ ) numbness, ( +) tingling, ( - ) new weaknesses Behavioral/Psych: ( - ) mood change, ( - ) new changes  All other systems were reviewed with the patient and are negative.  PHYSICAL EXAMINATION: ECOG PERFORMANCE STATUS: 1 - Symptomatic but completely ambulatory  Vitals:   08/29/22 1357  BP: (!) 147/71  Pulse: 73  Resp: 16  Temp: 97.9 F (36.6 C)  SpO2: 100%    Filed Weights   08/29/22 1357  Weight: 112 lb 6.4 oz (51 kg)   GENERAL: Well-appearing elderly Caucasian female, alert, no distress and comfortable SKIN: skin color, texture, turgor are normal, no rashes or significant lesions EYES: conjunctiva are pink and non-injected, sclera clear LUNGS: clear to auscultation and  percussion with normal breathing effort HEART: regular rate & rhythm and no murmurs and no  lower extremity edema Musculoskeletal: no cyanosis of digits and no clubbing  PSYCH: alert & oriented x 3, fluent speech NEURO: no focal motor/sensory deficits  LABORATORY DATA:  I have reviewed the data as listed    Latest Ref Rng & Units 08/29/2022    1:38 PM 08/15/2022   10:38 AM 08/01/2022   10:54 AM  CBC  WBC 4.0 - 10.5 K/uL 4.2  4.1  4.7   Hemoglobin 12.0 - 15.0 g/dL 10.5  10.8  10.6   Hematocrit 36.0 - 46.0 % 30.0  31.4  30.6   Platelets 150 - 400 K/uL 244  240  280        Latest Ref Rng & Units 08/29/2022    1:38 PM 08/15/2022   10:38 AM 08/01/2022   10:54 AM  CMP  Glucose 70 - 99 mg/dL 107  85  102   BUN 8 - 23 mg/dL '16  13  12   '$ Creatinine 0.44 - 1.00 mg/dL 0.80  0.75  0.76   Sodium 135 - 145 mmol/L 134  135  134   Potassium 3.5 - 5.1 mmol/L 4.2  4.1  4.1   Chloride 98 - 111 mmol/L 105  104  104   CO2 22 - 32 mmol/L '22  23  23   '$ Calcium 8.9 - 10.3 mg/dL 9.3  9.4  9.4   Total Protein 6.5 - 8.1 g/dL 6.5  6.8  7.3   Total Bilirubin 0.3 - 1.2 mg/dL 0.3  0.3  0.4   Alkaline Phos 38 - 126 U/L 81  100  94   AST 15 - 41 U/L '17  18  18   '$ ALT 0 - 44 U/L '8  9  7     '$ Lab Results  Component Value Date   MPROTEIN 0.2 (H) 08/15/2022   MPROTEIN 0.2 (H) 08/01/2022   MPROTEIN 0.2 (H) 07/18/2022   Lab Results  Component Value Date   KPAFRELGTCHN 8.7 08/15/2022   KPAFRELGTCHN 7.5 08/01/2022   KPAFRELGTCHN 8.7 07/18/2022   LAMBDASER 6.4 08/15/2022   LAMBDASER 6.3 08/01/2022   LAMBDASER 5.7 07/18/2022   KAPLAMBRATIO 1.36 08/15/2022   KAPLAMBRATIO 1.19 08/01/2022   KAPLAMBRATIO 1.53 07/18/2022   RADIOGRAPHIC STUDIES: No results found.  ASSESSMENT & PLAN [name] Page 89 y.o. female with medical history significant for newly diagnosed IgG lambda multiple myeloma who presents for a follow up visit.  #IgG Lambda Multiple Myeloma -- Diagnosis confirmed by bone marrow biopsy on 09/27/2021 which shows evidence of 20% involvement by plasma cells.  Additionally has anemia,  macrocytosis, leukopenia, and concern for lytic lesions on bone imaging --Prognostic panel shows a deletion 17 with a TP53 mutation --Started Cycle 1 Day 1 of Velcade/Dex on 10/18/2021 --Switched to Dara/Dex on 03/14/2022 due to neuropathy secondary to Velcade.  Plan: --Due for Cycle 7 Day 1 of Dara/Dex today  --Labs from 07/04/2022 showed M protein 0.2, kappa and lambda light chains normal. Today's myeloma labs pending.  --Labs today show white blood cell count 4.2, hemoglobin 10.5, MCV 92.3, and platelets of 244. --if Hgb remains consistently >10 can consider the addition of revlimid.  --Continue with biweekly labs, follow up visit and Dara/Dex treatments.   # H/O Confusion --decreased dexamethasone to 20 mg on infusion days.  --Symptoms resolved.   #Leukopenia/Macrocytic anemia/Vitamin B12 deficiency: --Secondary to chemotherapy and multiple myeloma --Received vitamin B12 injection 1000 mcg weekly  x 4 doses. Continue q 4 weeks.  -- continue p.o. supplementation.  #Right rib pain, improved: --CT chest from 10/25/2021 that didn't show any new or progressive disease.   #Abdominal cramping--resolved: --Symptoms occur after velcade injection.  --Has Bentyl as needed  #Neuropathy: --Grade 2 involving finger tips and feet --Secondary to velcade  --Currently on Lyrica 25 mg PO BID. Sent refill.   #Supportive Care -- chemotherapy education complete -- port placement not required.  -- zofran '8mg'$  q8H PRN and compazine '10mg'$  PO q6H for nausea -- acyclovir '400mg'$  PO BID for VCZ prophylaxis. Patient was not taking medication as prescribed.  -- Await dental clearance to start Zometa  No orders of the defined types were placed in this encounter.  All questions were answered. The patient knows to call the clinic with any problems, questions or concerns.  I have spent a total of 30 minutes minutes of face-to-face and non-face-to-face time, preparing to see the patient, performing a medically  appropriate examination, counseling and educating the patient, ordering medications, documenting clinical information in the electronic health record,  and care coordination.   Ledell Peoples, MD Department of Hematology/Oncology [place] [facility] Phone: [phone] Pager: [phone] Email: [email or web address]  08/29/2022 3:00 PM

## 2022-08-29 NOTE — Patient Instructions (Signed)
[facility]  Discharge Instructions: Thank you for choosing [facility] to provide your oncology and hematology care.   If you have a lab appointment with the Cancer Center, please go directly to the Cancer Center and check in at the registration area.   Wear comfortable clothing and clothing appropriate for easy access to any Portacath or PICC line.   We strive to give you quality time with your provider. You may need to reschedule your appointment if you arrive late (15 or more minutes).  Arriving late affects you and other patients whose appointments are after yours.  Also, if you miss three or more appointments without notifying the office, you may be dismissed from the clinic at the provider's discretion.      For prescription refill requests, have your pharmacy contact our office and allow 72 hours for refills to be completed.    Today you received the following chemotherapy and/or immunotherapy agents Darzalex Faspro      To help prevent nausea and vomiting after your treatment, we encourage you to take your nausea medication as directed.  BELOW ARE SYMPTOMS THAT SHOULD BE REPORTED IMMEDIATELY: *FEVER GREATER THAN 100.4 F (38 C) OR HIGHER *CHILLS OR SWEATING *NAUSEA AND VOMITING THAT IS NOT CONTROLLED WITH YOUR NAUSEA MEDICATION *UNUSUAL SHORTNESS OF BREATH *UNUSUAL BRUISING OR BLEEDING *URINARY PROBLEMS (pain or burning when urinating, or frequent urination) *BOWEL PROBLEMS (unusual diarrhea, constipation, pain near the anus) TENDERNESS IN MOUTH AND THROAT WITH OR WITHOUT PRESENCE OF ULCERS (sore throat, sores in mouth, or a toothache) UNUSUAL RASH, SWELLING OR PAIN  UNUSUAL VAGINAL DISCHARGE OR ITCHING   Items with * indicate a potential emergency and should be followed up as soon as possible or go to the Emergency Department if any problems should occur.  Please show the CHEMOTHERAPY ALERT CARD or IMMUNOTHERAPY ALERT CARD at  check-in to the Emergency Department and triage nurse.  Should you have questions after your visit or need to cancel or reschedule your appointment, please contact [facility]  Dept: [phone]  and follow the prompts.  Office hours are 8:00 a.m. to 4:30 p.m. Monday - Friday. Please note that voicemails left after 4:00 p.m. may not be returned until the following business day.  We are closed weekends and major holidays. You have access to a nurse at all times for urgent questions. Please call the main number to the clinic Dept: [phone] and follow the prompts.   For any non-urgent questions, you may also contact your provider using MyChart. We now offer e-Visits for anyone 18 and older to request care online for non-urgent symptoms. For details visit [email or web address].   Also download the MyChart app! Go to the app store, search "MyChart", open the app, select [facility], and log in with your MyChart username and password.  

## 2022-09-01 LAB — KAPPA/LAMBDA LIGHT CHAINS
Kappa free light chain: 8.5 mg/L (ref 3.3–19.4)
Kappa, lambda light chain ratio: 1.35 (ref 0.26–1.65)
Lambda free light chains: 6.3 mg/L (ref 5.7–26.3)

## 2022-09-02 LAB — MULTIPLE MYELOMA PANEL, SERUM
Albumin SerPl Elph-Mcnc: 3.7 g/dL (ref 2.9–4.4)
Albumin/Glob SerPl: 1.4 (ref 0.7–1.7)
Alpha 1: 0.3 g/dL (ref 0.0–0.4)
Alpha2 Glob SerPl Elph-Mcnc: 0.9 g/dL (ref 0.4–1.0)
B-Globulin SerPl Elph-Mcnc: 1.2 g/dL (ref 0.7–1.3)
Gamma Glob SerPl Elph-Mcnc: 0.4 g/dL (ref 0.4–1.8)
Globulin, Total: 2.8 g/dL (ref 2.2–3.9)
IgA: 9 mg/dL — ABNORMAL LOW (ref 64–422)
IgG (Immunoglobin G), Serum: 682 mg/dL (ref 586–1602)
IgM (Immunoglobulin M), Srm: 13 mg/dL — ABNORMAL LOW (ref 26–217)
M Protein SerPl Elph-Mcnc: 0.1 g/dL — ABNORMAL HIGH
Total Protein ELP: 6.5 g/dL (ref 6.0–8.5)

## 2022-09-08 ENCOUNTER — Other Ambulatory Visit: Payer: Self-pay | Admitting: Hematology and Oncology

## 2022-09-20 ENCOUNTER — Other Ambulatory Visit: Payer: Self-pay | Admitting: Physician Assistant

## 2022-09-21 ENCOUNTER — Other Ambulatory Visit: Payer: Self-pay

## 2022-09-26 ENCOUNTER — Inpatient Hospital Stay: Payer: Medicare PPO | Admitting: Physician Assistant

## 2022-09-26 ENCOUNTER — Inpatient Hospital Stay: Payer: Medicare PPO

## 2022-09-26 ENCOUNTER — Inpatient Hospital Stay: Payer: Medicare PPO | Attending: Hematology and Oncology

## 2022-09-26 VITALS — BP 131/70 | HR 75 | Temp 97.5°F | Resp 18 | Wt 112.8 lb

## 2022-09-26 DIAGNOSIS — C9000 Multiple myeloma not having achieved remission: Secondary | ICD-10-CM

## 2022-09-26 DIAGNOSIS — M545 Low back pain, unspecified: Secondary | ICD-10-CM | POA: Insufficient documentation

## 2022-09-26 DIAGNOSIS — Z5112 Encounter for antineoplastic immunotherapy: Secondary | ICD-10-CM | POA: Insufficient documentation

## 2022-09-26 DIAGNOSIS — Z800 Family history of malignant neoplasm of digestive organs: Secondary | ICD-10-CM | POA: Diagnosis not present

## 2022-09-26 DIAGNOSIS — T451X5D Adverse effect of antineoplastic and immunosuppressive drugs, subsequent encounter: Secondary | ICD-10-CM | POA: Diagnosis not present

## 2022-09-26 DIAGNOSIS — Z79899 Other long term (current) drug therapy: Secondary | ICD-10-CM | POA: Diagnosis not present

## 2022-09-26 DIAGNOSIS — G620 Drug-induced polyneuropathy: Secondary | ICD-10-CM | POA: Insufficient documentation

## 2022-09-26 DIAGNOSIS — E538 Deficiency of other specified B group vitamins: Secondary | ICD-10-CM | POA: Insufficient documentation

## 2022-09-26 DIAGNOSIS — Z79891 Long term (current) use of opiate analgesic: Secondary | ICD-10-CM | POA: Insufficient documentation

## 2022-09-26 LAB — CBC WITH DIFFERENTIAL (CANCER CENTER ONLY)
Abs Immature Granulocytes: 0.01 10*3/uL (ref 0.00–0.07)
Basophils Absolute: 0 10*3/uL (ref 0.0–0.1)
Basophils Relative: 1 %
Eosinophils Absolute: 0.1 10*3/uL (ref 0.0–0.5)
Eosinophils Relative: 2 %
HCT: 31.3 % — ABNORMAL LOW (ref 36.0–46.0)
Hemoglobin: 11 g/dL — ABNORMAL LOW (ref 12.0–15.0)
Immature Granulocytes: 0 %
Lymphocytes Relative: 30 %
Lymphs Abs: 1.6 10*3/uL (ref 0.7–4.0)
MCH: 32.7 pg (ref 26.0–34.0)
MCHC: 35.1 g/dL (ref 30.0–36.0)
MCV: 93.2 fL (ref 80.0–100.0)
Monocytes Absolute: 0.7 10*3/uL (ref 0.1–1.0)
Monocytes Relative: 13 %
Neutro Abs: 2.8 10*3/uL (ref 1.7–7.7)
Neutrophils Relative %: 54 %
Platelet Count: 247 10*3/uL (ref 150–400)
RBC: 3.36 MIL/uL — ABNORMAL LOW (ref 3.87–5.11)
RDW: 14.4 % (ref 11.5–15.5)
WBC Count: 5.1 10*3/uL (ref 4.0–10.5)
nRBC: 0 % (ref 0.0–0.2)

## 2022-09-26 LAB — CMP (CANCER CENTER ONLY)
ALT: 8 U/L (ref 0–44)
AST: 19 U/L (ref 15–41)
Albumin: 4.4 g/dL (ref 3.5–5.0)
Alkaline Phosphatase: 89 U/L (ref 38–126)
Anion gap: 9 (ref 5–15)
BUN: 15 mg/dL (ref 8–23)
CO2: 21 mmol/L — ABNORMAL LOW (ref 22–32)
Calcium: 9.5 mg/dL (ref 8.9–10.3)
Chloride: 103 mmol/L (ref 98–111)
Creatinine: 0.69 mg/dL (ref 0.44–1.00)
GFR, Estimated: >60 mL/min (ref >60)
Glucose, Bld: 102 mg/dL — ABNORMAL HIGH (ref 70–99)
Potassium: 4.1 mmol/L (ref 3.5–5.1)
Sodium: 133 mmol/L — ABNORMAL LOW (ref 135–145)
Total Bilirubin: 0.4 mg/dL (ref 0.3–1.2)
Total Protein: 7.4 g/dL (ref 6.5–8.1)

## 2022-09-26 LAB — LACTATE DEHYDROGENASE: LDH: 162 U/L (ref 98–192)

## 2022-09-26 MED ORDER — ACETAMINOPHEN 325 MG PO TABS
650.0000 mg | ORAL_TABLET | Freq: Once | ORAL | Status: AC
  Administered 2022-09-26: 650 mg via ORAL
  Filled 2022-09-26: qty 2

## 2022-09-26 MED ORDER — DIPHENHYDRAMINE HCL 25 MG PO CAPS
50.0000 mg | ORAL_CAPSULE | Freq: Once | ORAL | Status: AC
  Administered 2022-09-26: 50 mg via ORAL
  Filled 2022-09-26: qty 2

## 2022-09-26 MED ORDER — DARATUMUMAB-HYALURONIDASE-FIHJ 1800-30000 MG-UT/15ML ~~LOC~~ SOLN
1800.0000 mg | Freq: Once | SUBCUTANEOUS | Status: AC
  Administered 2022-09-26: 1800 mg via SUBCUTANEOUS
  Filled 2022-09-26: qty 15

## 2022-09-26 MED ORDER — DEXAMETHASONE 4 MG PO TABS
20.0000 mg | ORAL_TABLET | Freq: Once | ORAL | Status: AC
  Administered 2022-09-26: 20 mg via ORAL
  Filled 2022-09-26: qty 5

## 2022-09-26 MED ORDER — CYANOCOBALAMIN 1000 MCG/ML IJ SOLN
1000.0000 ug | Freq: Once | INTRAMUSCULAR | Status: AC
  Administered 2022-09-26: 1000 ug via INTRAMUSCULAR
  Filled 2022-09-26: qty 1

## 2022-09-26 NOTE — Progress Notes (Unsigned)
[place] [phone]   [phone]  PROGRESS NOTE  Patient Care Team: Burnard Bunting, MD as PCP - General (Internal Medicine)  Hematological/Oncological History # IgG Lambda Multiple Myeloma 06/04/2021: WBC 5.5, Hgb 11.1, MCV 101.3, Plt 306 07/24/2021: M protein 2.3, IFE shows IgG Lambda monoclonal specificity. Cr 0.9 08/07/2021: Establish care with Dr. Lorenso Courier 09/27/2021: Bmbx showed hypercellular bone marrow with plasma cell neoplasm, increased number of atypical plasma cells averaging 20% of all cells in the aspirate 10/18/2021: Cycle 1 of Velcade/Dexamethasone 11/08/2021: Cycle 2 of Velcade/Dexamethasone 11/29/2021: Cycle 3 of Velcade/Dexamethasone 12/20/2021: Cycle 4 of Velcade/Dexamethasone 01/10/2022: Cycle 5 of Velcade/Dexamethasone 01/31/2022: Cycle 6 of Velcade/Dexamethasone 02/28/2022: Cycle 7 of Velcade/Dexamethasone. Dose reduced velcade due to neuropathy  03/14/2022: Cycle 1 Day 1 of Dara/Dex (velcade d/c due to neuropathy).  04/11/2022: Cycle 2 Day 1 of Dara/Dex 05/09/2022: Cycle 3 Day 1 of Dara/Dex 06/06/2022: Cycle 4 Day 1 of Dara/Dex 07/04/2022: Cycle 5 Day 1 of Dara/Dex 08/01/2022: Cycle 6 Day 1 of Dara/Dex 08/29/2022: Cycle 7 Day 1 of Dara/Dex 09/26/2022: Cycle 8 Day 1 of Dara/Dex  Interval History:  [name] Page 89 y.o. female with medical history significant for  IgG lambda multiple myeloma who presents for a follow up visit. The patient's last visit was on 08/29/2022. In the interim, patient has had no major changes in her health. She is due for Cycle 8, Day 1.  On exam today [name] Page reports she is planning to undergo minimally invasive shoulder surgery due to ongoing left shoulder pain. She plans to schedule the surgery in between her treatment. She is otherwise feeling well with improvement of her energy and appetite levels. She denies nausea, vomiting or abdominal pain. Her bowel habits are unchanged without recurrent episodes  of diarrhea or constipation. She denies easy bruising or signs of active bleeding.She notes that she is not having any side effects as result of the Darzalex shots. She denies fevers, chills, night sweats, shortness of breath, chest pain or cough.  She has no other complaints. Rest of the 10 point ROS is below.  MEDICAL HISTORY:  Past Medical History:  Diagnosis Date   Alopecia 10/28/2016   Anginal pain (HCC)    Arthritis    Cervical spondylosis    C3-4 AND C4-5   Chest pain 07/28/2008   H/O, normal stress nuclear EF 78%   Chronic cough 07/12/2014   Collapsed lung 07/2010   being treated at [facility] - in left lung   Complication of anesthesia    Cough 07/30/2010   Qualifier: Diagnosis of  By: Chase Caller MD, Murali     Cystocele 05/23/2015   Cystocele, midline 05/02/2013   DEEP VEIN THROMBOSIS/PHLEBITIS 07/30/2010   Qualifier: History of  By: Harvest Dark CMA, Jennifer     Dislocation closed, shoulder 02/2013   DVT (deep venous thrombosis) ([place]) 2006   Encounter for therapeutic drug monitoring 10/03/2014   GERD (gastroesophageal reflux disease)    occ   Headache    hx   High cholesterol    History of recurrent UTIs 09/24/2011   HYPERLIPIDEMIA 07/30/2010   Qualifier: Diagnosis of  By: Harvest Dark CMA, Jennifer     Hypertension    HYPERTENSION 07/30/2010   Qualifier: Diagnosis of  By: Harvest Dark CMA, Jennifer     Hypothyroidism    Nasal itching 01/30/2014   Osteopenia 05/23/2015   Peripheral vascular disease ([place]) 2006   dvt's and 50 yrs ago   Pleuritic pain 12/17/2015   PONV (postoperative nausea and  vomiting)    Primary localized osteoarthritis of right hip 08/26/2016   Primary osteoarthritis of right hip 08/26/2016   Pulmonary collapse 07/30/2010   Qualifier: Diagnosis of  By: Chase Caller MD, Murali     PULMONARY NODULE 07/30/2010   Qualifier: Diagnosis of  By: Chase Caller MD, Murali     Rash and nonspecific skin eruption 01/02/2015   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    Sarcoidosis    Sarcoidosis of lung ([place])    Vaginal atrophy 09/24/2011   Visual field defect nasal step 01/02/2015    SURGICAL HISTORY: Past Surgical History:  Procedure Laterality Date   BACK SURGERY  2022   EYE SURGERY     FOOT SURGERY Left 06/2005   bunion hammer toe   HEMORRHOID SURGERY  12/1972   INTRAOCULAR LENS INSERTION     right eye 10 /16 and left 06/16/2005   JOINT REPLACEMENT     KYPHOPLASTY N/A 06/27/2021   Procedure: LUMBAR 3 AND LUMBAR  5 KYPHOPLASTY;  Surgeon: Phylliss Bob, MD;  Location: [facility];  Service: Orthopedics;  Laterality: N/A;   KYPHOPLASTY N/A 09/04/2021   Procedure: THORACIC SEVEN, THORACIC EIGHT, THORACIC NINE, THORACIC TEN KYPHOPLASTY;  Surgeon: Phylliss Bob, MD;  Location: [facility];  Service: Orthopedics;  Laterality: N/A;   SKIN BIOPSY  01/2007   basal cell carcinoma removed from right lower leg    TOTAL HIP ARTHROPLASTY  06/2007   left   TOTAL HIP ARTHROPLASTY Right 08/26/2016   Procedure: TOTAL HIP ARTHROPLASTY ANTERIOR APPROACH;  Surgeon: Melrose Nakayama, MD;  Location: [facility];  Service: Orthopedics;  Laterality: Right;   TOTAL KNEE ARTHROPLASTY Right 10/2007   right    SOCIAL HISTORY: Social History   Socioeconomic History   Marital status: Married    Spouse name: Not on file   Number of children: Not on file   Years of education: Not on file   Highest education level: Not on file  Occupational History   Occupation: retired    Fish farm manager: RETIRED  Tobacco Use   Smoking status: Never   Smokeless tobacco: Never  Vaping Use   Vaping Use: Never used  Substance and Sexual Activity   Alcohol use: Not Currently    Comment: BEER/glass of wine A DAY   Drug use: No   Sexual activity: Never  Other Topics Concern   Not on file  Social History Narrative   Not on file   Social Determinants of Health   Financial Resource Strain: Not on file  Food Insecurity: Not on file  Transportation Needs: Not on file  Physical Activity: Not on file   Stress: Not on file  Social Connections: Not on file  Intimate Partner Violence: Not on file    FAMILY HISTORY: Family History  Problem Relation Age of Onset   Diabetes Mother    Heart failure Mother    Hypertension Father    Diabetes Father    Heart disease Father    Heart attack Father     ALLERGIES:  has No Known Allergies.  MEDICATIONS:  Current Outpatient Medications  Medication Sig Dispense Refill   acyclovir (ZOVIRAX) 400 MG tablet Take 1 tablet (400 mg total) by mouth 2 (two) times daily. 60 tablet 2   amLODipine (NORVASC) 10 MG tablet Take 10 mg by mouth daily.     b complex vitamins capsule Take 1 capsule by mouth daily.     dicyclomine (BENTYL) 10 MG capsule Take 1 capsule (10 mg total) by mouth 3 (three)  times daily as needed for spasms. 90 capsule 0   gemfibrozil (LOPID) 600 MG tablet Take 600 mg by mouth 2 (two) times daily before a meal.     HYDROcodone-acetaminophen (NORCO/VICODIN) 5-325 MG tablet TAKE 1 TABLET BY MOUTH EVERY 6 HOURS AS NEEDED FOR MODERATE PAIN 30 tablet 0   levothyroxine (SYNTHROID) 50 MCG tablet Take 25 mcg by mouth daily.     LORazepam (ATIVAN) 0.5 MG tablet Take 0.5 mg by mouth daily as needed for anxiety or sleep.     losartan (COZAAR) 50 MG tablet Take 50 mg by mouth daily.     Multiple Vitamin (MULTIVITAMIN) capsule Take 1 capsule by mouth daily.     nitrofurantoin, macrocrystal-monohydrate, (MACROBID) 100 MG capsule Take 100 mg by mouth at bedtime.     ondansetron (ZOFRAN) 8 MG tablet Take 1 tablet (8 mg total) by mouth every 8 (eight) hours as needed. 30 tablet 0   pantoprazole (PROTONIX) 20 MG tablet Take 20 mg by mouth daily.     polyethylene glycol powder (GLYCOLAX/MIRALAX) 17 GM/SCOOP powder Take 17 g by mouth at bedtime.     pregabalin (LYRICA) 25 MG capsule TAKE ONE (1) CAPSULE BY MOUTH 2 TIMES DAILY 60 capsule 0   No current facility-administered medications for this visit.    REVIEW OF SYSTEMS:   Constitutional: ( - )  fevers, ( - )  chills , ( - ) night sweats Eyes: ( - ) blurriness of vision, ( - ) double vision, ( - ) watery eyes Ears, nose, mouth, throat, and face: ( - ) mucositis, ( - ) sore throat Respiratory: ( - ) cough, ( - ) dyspnea, ( - ) wheezes Cardiovascular: ( - ) palpitation, ( - ) chest discomfort, ( - ) lower extremity swelling Gastrointestinal:  ( - ) nausea, ( - ) heartburn, ( - ) change in bowel habits Skin: ( - ) abnormal skin rashes Lymphatics: ( - ) new lymphadenopathy, ( - ) easy bruising Neurological: (+ ) numbness, ( +) tingling, ( - ) new weaknesses Behavioral/Psych: ( - ) mood change, ( - ) new changes  All other systems were reviewed with the patient and are negative.  PHYSICAL EXAMINATION: ECOG PERFORMANCE STATUS: 1 - Symptomatic but completely ambulatory  Vitals:   09/26/22 1255  BP: 131/70  Pulse: 75  Resp: 18  Temp: (!) 97.5 F (36.4 C)  SpO2: 99%    Filed Weights   09/26/22 1255  Weight: 112 lb 12.8 oz (51.2 kg)   GENERAL: Well-appearing elderly Caucasian female, alert, no distress and comfortable SKIN: skin color, texture, turgor are normal, no rashes or significant lesions EYES: conjunctiva are pink and non-injected, sclera clear LUNGS: clear to auscultation and percussion with normal breathing effort HEART: regular rate & rhythm and no murmurs and no lower extremity edema Musculoskeletal: no cyanosis of digits and no clubbing  PSYCH: alert & oriented x 3, fluent speech NEURO: no focal motor/sensory deficits  LABORATORY DATA:  I have reviewed the data as listed    Latest Ref Rng & Units 09/26/2022   12:09 PM 08/29/2022    1:38 PM 08/15/2022   10:38 AM  CBC  WBC 4.0 - 10.5 K/uL 5.1  4.2  4.1   Hemoglobin 12.0 - 15.0 g/dL 11.0  10.5  10.8   Hematocrit 36.0 - 46.0 % 31.3  30.0  31.4   Platelets 150 - 400 K/uL 247  244  240        Latest Ref  Rng & Units 09/26/2022   12:09 PM 08/29/2022    1:38 PM 08/15/2022   10:38 AM  CMP  Glucose 70 - 99 mg/dL 102   107  85   BUN 8 - 23 mg/dL 15  16  13   $ Creatinine 0.44 - 1.00 mg/dL 0.69  0.80  0.75   Sodium 135 - 145 mmol/L 133  134  135   Potassium 3.5 - 5.1 mmol/L 4.1  4.2  4.1   Chloride 98 - 111 mmol/L 103  105  104   CO2 22 - 32 mmol/L 21  22  23   $ Calcium 8.9 - 10.3 mg/dL 9.5  9.3  9.4   Total Protein 6.5 - 8.1 g/dL 7.4  6.5  6.8   Total Bilirubin 0.3 - 1.2 mg/dL 0.4  0.3  0.3   Alkaline Phos 38 - 126 U/L 89  81  100   AST 15 - 41 U/L 19  17  18   $ ALT 0 - 44 U/L 8  8  9     $ Lab Results  Component Value Date   MPROTEIN 0.1 (H) 08/29/2022   MPROTEIN 0.2 (H) 08/15/2022   MPROTEIN 0.2 (H) 08/01/2022   Lab Results  Component Value Date   KPAFRELGTCHN 8.5 08/29/2022   KPAFRELGTCHN 8.7 08/15/2022   KPAFRELGTCHN 7.5 08/01/2022   LAMBDASER 6.3 08/29/2022   LAMBDASER 6.4 08/15/2022   LAMBDASER 6.3 08/01/2022   KAPLAMBRATIO 1.35 08/29/2022   KAPLAMBRATIO 1.36 08/15/2022   KAPLAMBRATIO 1.19 08/01/2022   RADIOGRAPHIC STUDIES: No results found.  ASSESSMENT & PLAN [name] Page 89 y.o. female with medical history significant for newly diagnosed IgG lambda multiple myeloma who presents for a follow up visit.  #IgG Lambda Multiple Myeloma -- Diagnosis confirmed by bone marrow biopsy on 09/27/2021 which shows evidence of 20% involvement by plasma cells.  Additionally has anemia, macrocytosis, leukopenia, and concern for lytic lesions on bone imaging --Prognostic panel shows a deletion 17 with a TP53 mutation --Started Cycle 1 Day 1 of Velcade/Dex on 10/18/2021 --Switched to Dara/Dex on 03/14/2022 due to neuropathy secondary to Velcade.  Plan: --Due for Cycle 8 Day 1 of Dara/Dex today  --Labs from 08/29/2022 showed M protein 0.1, kappa and lambda light chains normal. Today's myeloma labs pending.  --Labs today show white blood cell count 5.1, hemoglobin 11.0, MCV 93.2, and platelets of 247. --if Hgb remains consistently >10 can consider the addition of revlimid.  --Continue with biweekly  labs, follow up visit and Dara/Dex treatments.   # H/O Confusion --decreased dexamethasone to 20 mg on infusion days.  --Symptoms resolved.   #Leukopenia/Macrocytic anemia/Vitamin B12 deficiency: --Secondary to chemotherapy and multiple myeloma --Received vitamin B12 injection 1000 mcg weekly x 4 doses. Continue q 4 weeks.  -- continue p.o. supplementation.  #Right rib pain, improved: --CT chest from 10/25/2021 that didn't show any new or progressive disease.   #Abdominal cramping--resolved: --Symptoms occur after velcade injection.  --Has Bentyl as needed  #Neuropathy: --Grade 2 involving finger tips and feet --Secondary to velcade  --Currently on Lyrica 25 mg PO BID. Sent refill.   #Supportive Care -- chemotherapy education complete -- port placement not required.  -- zofran 16m q8H PRN and compazine 1103mPO q6H for nausea -- acyclovir 40045mO BID for VCZ prophylaxis. Patient was not taking medication as prescribed.  -- Await dental clearance to start Zometa  No orders of the defined types were placed in this encounter.  All questions were answered. The patient  knows to call the clinic with any problems, questions or concerns.  I have spent a total of 30 minutes minutes of face-to-face and non-face-to-face time, preparing to see the patient, performing a medically appropriate examination, counseling and educating the patient, ordering medications, documenting clinical information in the electronic health record,  and care coordination.   Dede Query PA-C Dept of Hematology and [place] at [facility] Phone: [phone]  09/26/2022 1:26 PM

## 2022-09-27 LAB — BETA 2 MICROGLOBULIN, SERUM: Beta-2 Microglobulin: 1.6 mg/L (ref 0.6–2.4)

## 2022-09-28 ENCOUNTER — Encounter: Payer: Self-pay | Admitting: Hematology and Oncology

## 2022-09-29 LAB — KAPPA/LAMBDA LIGHT CHAINS
Kappa free light chain: 8.3 mg/L (ref 3.3–19.4)
Kappa, lambda light chain ratio: 1.17 (ref 0.26–1.65)
Lambda free light chains: 7.1 mg/L (ref 5.7–26.3)

## 2022-09-30 ENCOUNTER — Other Ambulatory Visit: Payer: Self-pay

## 2022-10-02 LAB — MULTIPLE MYELOMA PANEL, SERUM
Albumin SerPl Elph-Mcnc: 3.6 g/dL (ref 2.9–4.4)
Albumin/Glob SerPl: 1.3 (ref 0.7–1.7)
Alpha 1: 0.4 g/dL (ref 0.0–0.4)
Alpha2 Glob SerPl Elph-Mcnc: 0.9 g/dL (ref 0.4–1.0)
B-Globulin SerPl Elph-Mcnc: 1.3 g/dL (ref 0.7–1.3)
Gamma Glob SerPl Elph-Mcnc: 0.4 g/dL (ref 0.4–1.8)
Globulin, Total: 2.9 g/dL (ref 2.2–3.9)
IgA: 13 mg/dL — ABNORMAL LOW (ref 64–422)
IgG (Immunoglobin G), Serum: 706 mg/dL (ref 586–1602)
IgM (Immunoglobulin M), Srm: 17 mg/dL — ABNORMAL LOW (ref 26–217)
M Protein SerPl Elph-Mcnc: 0.2 g/dL — ABNORMAL HIGH
Total Protein ELP: 6.5 g/dL (ref 6.0–8.5)

## 2022-10-03 ENCOUNTER — Other Ambulatory Visit: Payer: Self-pay

## 2022-10-06 ENCOUNTER — Other Ambulatory Visit: Payer: Self-pay | Admitting: Hematology and Oncology

## 2022-10-06 ENCOUNTER — Telehealth: Payer: Self-pay | Admitting: *Deleted

## 2022-10-06 MED ORDER — HYDROCODONE-ACETAMINOPHEN 5-325 MG PO TABS: ORAL_TABLET | ORAL | 0 refills | Status: DC

## 2022-10-06 NOTE — Telephone Encounter (Signed)
Pt states she needs a refill of hydrocodone sent to [facility].

## 2022-10-07 ENCOUNTER — Telehealth: Payer: Self-pay | Admitting: *Deleted

## 2022-10-07 ENCOUNTER — Other Ambulatory Visit: Payer: Self-pay | Admitting: Hematology and Oncology

## 2022-10-07 MED ORDER — HYDROCODONE-ACETAMINOPHEN 5-325 MG PO TABS: ORAL_TABLET | ORAL | 0 refills | Status: DC

## 2022-10-07 NOTE — Telephone Encounter (Signed)
Refill of Hydrocodone-acetaminophen 5-325 MG tablet sent electronically by Dr. Loma Messing 10/06/22 to [facility]. [facility] informed office that electronic refills not received at this time due to [place] system being down. They are accepting paper RX on at this time for  controlled subs.  If patient wants to have prescription escribed, [facility] on [address] is close to [employer]. Other option is for patient to pick up paper RX from this office and take to [facility].  TC to patient regarding refill of Hydrocodone-acetaminophen 5-325 MG tablet. Patient prefers RX sent to [facility] as above. Dr. Lorenso Courier informed

## 2022-10-14 ENCOUNTER — Ambulatory Visit: Payer: Medicare PPO | Admitting: Internal Medicine

## 2022-10-24 ENCOUNTER — Inpatient Hospital Stay: Payer: Medicare PPO | Attending: Hematology and Oncology

## 2022-10-24 ENCOUNTER — Other Ambulatory Visit: Payer: Self-pay

## 2022-10-24 ENCOUNTER — Inpatient Hospital Stay: Payer: Medicare PPO | Admitting: Physician Assistant

## 2022-10-24 ENCOUNTER — Inpatient Hospital Stay: Payer: Medicare PPO

## 2022-10-24 DIAGNOSIS — Z800 Family history of malignant neoplasm of digestive organs: Secondary | ICD-10-CM | POA: Diagnosis not present

## 2022-10-24 DIAGNOSIS — C9000 Multiple myeloma not having achieved remission: Secondary | ICD-10-CM

## 2022-10-24 DIAGNOSIS — Z79899 Other long term (current) drug therapy: Secondary | ICD-10-CM | POA: Insufficient documentation

## 2022-10-24 DIAGNOSIS — T451X5D Adverse effect of antineoplastic and immunosuppressive drugs, subsequent encounter: Secondary | ICD-10-CM | POA: Insufficient documentation

## 2022-10-24 DIAGNOSIS — Z5112 Encounter for antineoplastic immunotherapy: Secondary | ICD-10-CM | POA: Diagnosis present

## 2022-10-24 DIAGNOSIS — Z79620 Long term (current) use of immunosuppressive biologic: Secondary | ICD-10-CM | POA: Insufficient documentation

## 2022-10-24 DIAGNOSIS — G620 Drug-induced polyneuropathy: Secondary | ICD-10-CM | POA: Diagnosis not present

## 2022-10-24 DIAGNOSIS — E538 Deficiency of other specified B group vitamins: Secondary | ICD-10-CM | POA: Insufficient documentation

## 2022-10-24 LAB — CMP (CANCER CENTER ONLY)
ALT: 8 U/L (ref 0–44)
AST: 19 U/L (ref 15–41)
Albumin: 4.4 g/dL (ref 3.5–5.0)
Alkaline Phosphatase: 89 U/L (ref 38–126)
Anion gap: 8 (ref 5–15)
BUN: 16 mg/dL (ref 8–23)
CO2: 23 mmol/L (ref 22–32)
Calcium: 9.3 mg/dL (ref 8.9–10.3)
Chloride: 100 mmol/L (ref 98–111)
Creatinine: 0.67 mg/dL (ref 0.44–1.00)
GFR, Estimated: >60 mL/min (ref >60)
Glucose, Bld: 117 mg/dL — ABNORMAL HIGH (ref 70–99)
Potassium: 4 mmol/L (ref 3.5–5.1)
Sodium: 131 mmol/L — ABNORMAL LOW (ref 135–145)
Total Bilirubin: 0.3 mg/dL (ref 0.3–1.2)
Total Protein: 7.1 g/dL (ref 6.5–8.1)

## 2022-10-24 LAB — CBC WITH DIFFERENTIAL (CANCER CENTER ONLY)
Abs Immature Granulocytes: 0.01 10*3/uL (ref 0.00–0.07)
Basophils Absolute: 0 10*3/uL (ref 0.0–0.1)
Basophils Relative: 0 %
Eosinophils Absolute: 0.1 10*3/uL (ref 0.0–0.5)
Eosinophils Relative: 3 %
HCT: 30.9 % — ABNORMAL LOW (ref 36.0–46.0)
Hemoglobin: 10.8 g/dL — ABNORMAL LOW (ref 12.0–15.0)
Immature Granulocytes: 0 %
Lymphocytes Relative: 29 %
Lymphs Abs: 1.2 10*3/uL (ref 0.7–4.0)
MCH: 32.7 pg (ref 26.0–34.0)
MCHC: 35 g/dL (ref 30.0–36.0)
MCV: 93.6 fL (ref 80.0–100.0)
Monocytes Absolute: 0.5 10*3/uL (ref 0.1–1.0)
Monocytes Relative: 12 %
Neutro Abs: 2.2 10*3/uL (ref 1.7–7.7)
Neutrophils Relative %: 56 %
Platelet Count: 252 10*3/uL (ref 150–400)
RBC: 3.3 MIL/uL — ABNORMAL LOW (ref 3.87–5.11)
RDW: 13.8 % (ref 11.5–15.5)
WBC Count: 4 10*3/uL (ref 4.0–10.5)
nRBC: 0 % (ref 0.0–0.2)

## 2022-10-24 LAB — LACTATE DEHYDROGENASE: LDH: 146 U/L (ref 98–192)

## 2022-10-24 MED ORDER — ACETAMINOPHEN 325 MG PO TABS
650.0000 mg | ORAL_TABLET | Freq: Once | ORAL | Status: AC
  Administered 2022-10-24: 650 mg via ORAL
  Filled 2022-10-24: qty 2

## 2022-10-24 MED ORDER — DEXAMETHASONE 4 MG PO TABS
20.0000 mg | ORAL_TABLET | Freq: Once | ORAL | Status: AC
  Administered 2022-10-24: 20 mg via ORAL
  Filled 2022-10-24: qty 5

## 2022-10-24 MED ORDER — DIPHENHYDRAMINE HCL 25 MG PO CAPS
50.0000 mg | ORAL_CAPSULE | Freq: Once | ORAL | Status: AC
  Administered 2022-10-24: 50 mg via ORAL
  Filled 2022-10-24: qty 2

## 2022-10-24 MED ORDER — DARATUMUMAB-HYALURONIDASE-FIHJ 1800-30000 MG-UT/15ML ~~LOC~~ SOLN
1800.0000 mg | Freq: Once | SUBCUTANEOUS | Status: AC
  Administered 2022-10-24: 1800 mg via SUBCUTANEOUS
  Filled 2022-10-24: qty 15

## 2022-10-24 NOTE — Progress Notes (Signed)
[place] Telephone:([phone]   [phone]  PROGRESS NOTE  Patient Care Team: Burnard Bunting, MD as PCP - General (Internal Medicine)  Hematological/Oncological History # IgG Lambda Multiple Myeloma 06/04/2021: WBC 5.5, Hgb 11.1, MCV 101.3, Plt 306 07/24/2021: M protein 2.3, IFE shows IgG Lambda monoclonal specificity. Cr 0.9 08/07/2021: Establish care with Dr. Lorenso Courier 09/27/2021: Bmbx showed hypercellular bone marrow with plasma cell neoplasm, increased number of atypical plasma cells averaging 20% of all cells in the aspirate 10/18/2021: Cycle 1 of Velcade/Dexamethasone 11/08/2021: Cycle 2 of Velcade/Dexamethasone 11/29/2021: Cycle 3 of Velcade/Dexamethasone 12/20/2021: Cycle 4 of Velcade/Dexamethasone 01/10/2022: Cycle 5 of Velcade/Dexamethasone 01/31/2022: Cycle 6 of Velcade/Dexamethasone 02/28/2022: Cycle 7 of Velcade/Dexamethasone. Dose reduced velcade due to neuropathy  03/14/2022: Cycle 1 Day 1 of Dara/Dex (velcade d/c due to neuropathy).  04/11/2022: Cycle 2 Day 1 of Dara/Dex 05/09/2022: Cycle 3 Day 1 of Dara/Dex 06/06/2022: Cycle 4 Day 1 of Dara/Dex 07/04/2022: Cycle 5 Day 1 of Dara/Dex 08/01/2022: Cycle 6 Day 1 of Dara/Dex 08/29/2022: Cycle 7 Day 1 of Dara/Dex 09/26/2022: Cycle 8 Day 1 of Dara/Dex 10/24/2022: Cycle 9 Day 1 of Dara/Dex  Interval History:  [name] Page 89 y.o. female with medical history significant for  IgG lambda multiple myeloma who presents for a follow up visit. The patient's last visit was on 09/29/2022. In the interim, patient has had no major changes in her health. She is due for Cycle 9, Day 1.  On exam today [name] Page reports that she is continuing to tolerate Dara/Dex without any new or concerning symptoms. Her energy and appetite are stable. Her weight is overall unchanged for the last several months.She denies nausea, vomiting or abdominal pain. Her bowel habits are unchanged without recurrent episodes of diarrhea or  constipation. She denies easy bruising or signs of active bleeding. Her neuropathy is stable, mainly in her lower extremities. She is compliant with taking her Lyrica. She denies fevers, chills, night sweats, shortness of breath, chest pain or cough.  She has no other complaints. Rest of the 10 point ROS is below.  MEDICAL HISTORY:  Past Medical History:  Diagnosis Date   Alopecia 10/28/2016   Anginal pain (HCC)    Arthritis    Cervical spondylosis    C3-4 AND C4-5   Chest pain 07/28/2008   H/O, normal stress nuclear EF 78%   Chronic cough 07/12/2014   Collapsed lung 07/2010   being treated at [facility] - in left lung   Complication of anesthesia    Cough 07/30/2010   Qualifier: Diagnosis of  By: Chase Caller MD, Murali     Cystocele 05/23/2015   Cystocele, midline 05/02/2013   DEEP VEIN THROMBOSIS/PHLEBITIS 07/30/2010   Qualifier: History of  By: Harvest Dark CMA, Jennifer     Dislocation closed, shoulder 02/2013   DVT (deep venous thrombosis) ([place]) 2006   Encounter for therapeutic drug monitoring 10/03/2014   GERD (gastroesophageal reflux disease)    occ   Headache    hx   High cholesterol    History of recurrent UTIs 09/24/2011   HYPERLIPIDEMIA 07/30/2010   Qualifier: Diagnosis of  By: Harvest Dark CMA, Jennifer     Hypertension    HYPERTENSION 07/30/2010   Qualifier: Diagnosis of  By: Harvest Dark CMA, Jennifer     Hypothyroidism    Nasal itching 01/30/2014   Osteopenia 05/23/2015   Peripheral vascular disease ([place]) 2006   dvt's and 50 yrs ago   Pleuritic pain 12/17/2015   PONV (postoperative nausea and vomiting)  Primary localized osteoarthritis of right hip 08/26/2016   Primary osteoarthritis of right hip 08/26/2016   Pulmonary collapse 07/30/2010   Qualifier: Diagnosis of  By: Chase Caller MD, Murali     PULMONARY NODULE 07/30/2010   Qualifier: Diagnosis of  By: Chase Caller MD, Murali     Rash and nonspecific skin eruption 01/02/2015   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    Sarcoidosis    Sarcoidosis of lung ([place])    Vaginal atrophy 09/24/2011   Visual field defect nasal step 01/02/2015    SURGICAL HISTORY: Past Surgical History:  Procedure Laterality Date   BACK SURGERY  2022   EYE SURGERY     FOOT SURGERY Left 06/2005   bunion hammer toe   HEMORRHOID SURGERY  12/1972   INTRAOCULAR LENS INSERTION     right eye 10 /16 and left 06/16/2005   JOINT REPLACEMENT     KYPHOPLASTY N/A 06/27/2021   Procedure: LUMBAR 3 AND LUMBAR  5 KYPHOPLASTY;  Surgeon: Phylliss Bob, MD;  Location: [place];  Service: Orthopedics;  Laterality: N/A;   KYPHOPLASTY N/A 09/04/2021   Procedure: THORACIC SEVEN, THORACIC EIGHT, THORACIC NINE, THORACIC TEN KYPHOPLASTY;  Surgeon: Phylliss Bob, MD;  Location: [place];  Service: Orthopedics;  Laterality: N/A;   SKIN BIOPSY  01/2007   basal cell carcinoma removed from right lower leg    TOTAL HIP ARTHROPLASTY  06/2007   left   TOTAL HIP ARTHROPLASTY Right 08/26/2016   Procedure: TOTAL HIP ARTHROPLASTY ANTERIOR APPROACH;  Surgeon: Melrose Nakayama, MD;  Location: [place];  Service: Orthopedics;  Laterality: Right;   TOTAL KNEE ARTHROPLASTY Right 10/2007   right    SOCIAL HISTORY: Social History   Socioeconomic History   Marital status: Married    Spouse name: Not on file   Number of children: Not on file   Years of education: Not on file   Highest education level: Not on file  Occupational History   Occupation: retired    Fish farm manager: RETIRED  Tobacco Use   Smoking status: Never   Smokeless tobacco: Never  Vaping Use   Vaping Use: Never used  Substance and Sexual Activity   Alcohol use: Not Currently    Comment: BEER/glass of wine A DAY   Drug use: No   Sexual activity: Never  Other Topics Concern   Not on file  Social History Narrative   Not on file   Social Determinants of Health   Financial Resource Strain: Not on file  Food Insecurity: Not on file  Transportation Needs: Not on file  Physical Activity: Not on file   Stress: Not on file  Social Connections: Not on file  Intimate Partner Violence: Not on file    FAMILY HISTORY: Family History  Problem Relation Age of Onset   Diabetes Mother    Heart failure Mother    Hypertension Father    Diabetes Father    Heart disease Father    Heart attack Father     ALLERGIES:  has No Known Allergies.  MEDICATIONS:  Current Outpatient Medications  Medication Sig Dispense Refill   acyclovir (ZOVIRAX) 400 MG tablet Take 1 tablet (400 mg total) by mouth 2 (two) times daily. 60 tablet 2   amLODipine (NORVASC) 10 MG tablet Take 10 mg by mouth daily.     b complex vitamins capsule Take 1 capsule by mouth daily.     dicyclomine (BENTYL) 10 MG capsule Take 1 capsule (10 mg total) by mouth 3 (three) times daily as needed  for spasms. 90 capsule 0   gemfibrozil (LOPID) 600 MG tablet Take 600 mg by mouth 2 (two) times daily before a meal.     HYDROcodone-acetaminophen (NORCO/VICODIN) 5-325 MG tablet TAKE 1 TABLET BY MOUTH EVERY 6 HOURS AS NEEDED FOR MODERATE PAIN 30 tablet 0   levothyroxine (SYNTHROID) 50 MCG tablet Take 25 mcg by mouth daily.     LORazepam (ATIVAN) 0.5 MG tablet Take 0.5 mg by mouth daily as needed for anxiety or sleep.     losartan (COZAAR) 50 MG tablet Take 50 mg by mouth daily.     Multiple Vitamin (MULTIVITAMIN) capsule Take 1 capsule by mouth daily.     nitrofurantoin, macrocrystal-monohydrate, (MACROBID) 100 MG capsule Take 100 mg by mouth at bedtime.     ondansetron (ZOFRAN) 8 MG tablet Take 1 tablet (8 mg total) by mouth every 8 (eight) hours as needed. 30 tablet 0   pantoprazole (PROTONIX) 20 MG tablet Take 20 mg by mouth daily.     polyethylene glycol powder (GLYCOLAX/MIRALAX) 17 GM/SCOOP powder Take 17 g by mouth at bedtime.     pregabalin (LYRICA) 25 MG capsule TAKE ONE (1) CAPSULE BY MOUTH 2 TIMES DAILY 60 capsule 0   No current facility-administered medications for this visit.    REVIEW OF SYSTEMS:   Constitutional: ( - )  fevers, ( - )  chills , ( - ) night sweats Eyes: ( - ) blurriness of vision, ( - ) double vision, ( - ) watery eyes Ears, nose, mouth, throat, and face: ( - ) mucositis, ( - ) sore throat Respiratory: ( - ) cough, ( - ) dyspnea, ( - ) wheezes Cardiovascular: ( - ) palpitation, ( - ) chest discomfort, ( - ) lower extremity swelling Gastrointestinal:  ( - ) nausea, ( - ) heartburn, ( - ) change in bowel habits Skin: ( - ) abnormal skin rashes Lymphatics: ( - ) new lymphadenopathy, ( - ) easy bruising Neurological: (+ ) numbness, ( +) tingling, ( - ) new weaknesses Behavioral/Psych: ( - ) mood change, ( - ) new changes  All other systems were reviewed with the patient and are negative.  PHYSICAL EXAMINATION: ECOG PERFORMANCE STATUS: 1 - Symptomatic but completely ambulatory  Vitals:   10/24/22 1331  BP: 139/61  Pulse: 71  Resp: 14  Temp: 97.7 F (36.5 C)  SpO2: 100%    Filed Weights   10/24/22 1331  Weight: 112 lb 8 oz (51 kg)   GENERAL: Well-appearing elderly Caucasian female, alert, no distress and comfortable SKIN: skin color, texture, turgor are normal, no rashes or significant lesions EYES: conjunctiva are pink and non-injected, sclera clear LUNGS: clear to auscultation and percussion with normal breathing effort HEART: regular rate & rhythm and no murmurs and no lower extremity edema Musculoskeletal: no cyanosis of digits and no clubbing  PSYCH: alert & oriented x 3, fluent speech NEURO: no focal motor/sensory deficits  LABORATORY DATA:  I have reviewed the data as listed    Latest Ref Rng & Units 10/24/2022   12:59 PM 09/26/2022   12:09 PM 08/29/2022    1:38 PM  CBC  WBC 4.0 - 10.5 K/uL 4.0  5.1  4.2   Hemoglobin 12.0 - 15.0 g/dL 10.8  11.0  10.5   Hematocrit 36.0 - 46.0 % 30.9  31.3  30.0   Platelets 150 - 400 K/uL 252  247  244        Latest Ref Rng & Units 10/24/2022  12:59 PM 09/26/2022   12:09 PM 08/29/2022    1:38 PM  CMP  Glucose 70 - 99 mg/dL 117  102   107   BUN 8 - 23 mg/dL 16  15  16    Creatinine 0.44 - 1.00 mg/dL 0.67  0.69  0.80   Sodium 135 - 145 mmol/L 131  133  134   Potassium 3.5 - 5.1 mmol/L 4.0  4.1  4.2   Chloride 98 - 111 mmol/L 100  103  105   CO2 22 - 32 mmol/L 23  21  22    Calcium 8.9 - 10.3 mg/dL 9.3  9.5  9.3   Total Protein 6.5 - 8.1 g/dL 7.1  7.4  6.5   Total Bilirubin 0.3 - 1.2 mg/dL 0.3  0.4  0.3   Alkaline Phos 38 - 126 U/L 89  89  81   AST 15 - 41 U/L 19  19  17    ALT 0 - 44 U/L 8  8  8      Lab Results  Component Value Date   MPROTEIN 0.2 (H) 09/26/2022   MPROTEIN 0.1 (H) 08/29/2022   MPROTEIN 0.2 (H) 08/15/2022   Lab Results  Component Value Date   KPAFRELGTCHN 8.3 09/26/2022   KPAFRELGTCHN 8.5 08/29/2022   KPAFRELGTCHN 8.7 08/15/2022   LAMBDASER 7.1 09/26/2022   LAMBDASER 6.3 08/29/2022   LAMBDASER 6.4 08/15/2022   KAPLAMBRATIO 1.17 09/26/2022   KAPLAMBRATIO 1.35 08/29/2022   KAPLAMBRATIO 1.36 08/15/2022   RADIOGRAPHIC STUDIES: No results found.  ASSESSMENT & PLAN SUPREET TIM 89 y.o. female with medical history significant for newly diagnosed IgG lambda multiple myeloma who presents for a follow up visit.  #IgG Lambda Multiple Myeloma -- Diagnosis confirmed by bone marrow biopsy on 09/27/2021 which shows evidence of 20% involvement by plasma cells.  Additionally has anemia, macrocytosis, leukopenia, and concern for lytic lesions on bone imaging --Prognostic panel shows a deletion 17 with a TP53 mutation --Started Cycle 1 Day 1 of Velcade/Dex on 10/18/2021 --Switched to Dara/Dex on 03/14/2022 due to neuropathy secondary to Velcade.  Plan: --Due for Cycle 9 Day 1 of Dara/Dex today  --Labs from 09/26/2022 showed M protein 0.2, kappa and lambda light chains normal. Today's myeloma labs pending.  --Labs today show white blood cell count 4.0, hemoglobin 10.8, MCV 93.6, and platelets of 252. --if Hgb remains consistently >10 can consider the addition of revlimid.  --RTC in 4 weeks with labs, follow  up visit and Dara/Dex treatments.   # H/O Confusion --decreased dexamethasone to 20 mg on infusion days.  --Symptoms resolved.   #Leukopenia/Macrocytic anemia/Vitamin B12 deficiency: --Secondary to chemotherapy and multiple myeloma --Received vitamin B12 injection 1000 mcg weekly x 4 doses. Continue q 4 weeks.  -- continue p.o. supplementation.  #Right rib pain, improved: --CT chest from 10/25/2021 that didn't show any new or progressive disease.   #Abdominal cramping--resolved: --Symptoms occur after velcade injection.  --Has Bentyl as needed  #Neuropathy: --Grade 2 involving finger tips and feet --Secondary to velcade  --Currently on Lyrica 25 mg PO BID.   #Supportive Care -- chemotherapy education complete -- port placement not required.  -- zofran 8mg  q8H PRN and compazine 10mg  PO q6H for nausea -- acyclovir 400mg  PO BID for VCZ prophylaxis. Patient was not taking medication as prescribed.  -- Await dental clearance to start Zometa  No orders of the defined types were placed in this encounter.  All questions were answered. The patient knows to call the clinic with  any problems, questions or concerns.  I have spent a total of 30 minutes minutes of face-to-face and non-face-to-face time, preparing to see the patient, performing a medically appropriate examination, counseling and educating the patient, ordering medications, documenting clinical information in the electronic health record,  and care coordination.   Dede Query PA-C Dept of Hematology and [place] at [facility] Phone: [phone]  10/24/2022 1:57 PM

## 2022-10-24 NOTE — Patient Instructions (Signed)
[facility]  Discharge Instructions: Thank you for choosing [facility] to provide your oncology and hematology care.   If you have a lab appointment with the Cancer Center, please go directly to the Cancer Center and check in at the registration area.   Wear comfortable clothing and clothing appropriate for easy access to any Portacath or PICC line.   We strive to give you quality time with your provider. You may need to reschedule your appointment if you arrive late (15 or more minutes).  Arriving late affects you and other patients whose appointments are after yours.  Also, if you miss three or more appointments without notifying the office, you may be dismissed from the clinic at the provider's discretion.      For prescription refill requests, have your pharmacy contact our office and allow 72 hours for refills to be completed.    Today you received the following chemotherapy and/or immunotherapy agents: Daratumumab - Faspro.       To help prevent nausea and vomiting after your treatment, we encourage you to take your nausea medication as directed.  BELOW ARE SYMPTOMS THAT SHOULD BE REPORTED IMMEDIATELY: *FEVER GREATER THAN 100.4 F (38 C) OR HIGHER *CHILLS OR SWEATING *NAUSEA AND VOMITING THAT IS NOT CONTROLLED WITH YOUR NAUSEA MEDICATION *UNUSUAL SHORTNESS OF BREATH *UNUSUAL BRUISING OR BLEEDING *URINARY PROBLEMS (pain or burning when urinating, or frequent urination) *BOWEL PROBLEMS (unusual diarrhea, constipation, pain near the anus) TENDERNESS IN MOUTH AND THROAT WITH OR WITHOUT PRESENCE OF ULCERS (sore throat, sores in mouth, or a toothache) UNUSUAL RASH, SWELLING OR PAIN  UNUSUAL VAGINAL DISCHARGE OR ITCHING   Items with * indicate a potential emergency and should be followed up as soon as possible or go to the Emergency Department if any problems should occur.  Please show the CHEMOTHERAPY ALERT CARD or IMMUNOTHERAPY ALERT  CARD at check-in to the Emergency Department and triage nurse.  Should you have questions after your visit or need to cancel or reschedule your appointment, please contact [facility]  Dept: [phone]  and follow the prompts.  Office hours are 8:00 a.m. to 4:30 p.m. Monday - Friday. Please note that voicemails left after 4:00 p.m. may not be returned until the following business day.  We are closed weekends and major holidays. You have access to a nurse at all times for urgent questions. Please call the main number to the clinic Dept: [phone] and follow the prompts.   For any non-urgent questions, you may also contact your provider using MyChart. We now offer e-Visits for anyone 18 and older to request care online for non-urgent symptoms. For details visit [email or web address].   Also download the MyChart app! Go to the app store, search "MyChart", open the app, select [facility], and log in with your MyChart username and password.   

## 2022-10-27 LAB — KAPPA/LAMBDA LIGHT CHAINS
Kappa free light chain: 7.5 mg/L (ref 3.3–19.4)
Kappa, lambda light chain ratio: 1.12 (ref 0.26–1.65)
Lambda free light chains: 6.7 mg/L (ref 5.7–26.3)

## 2022-10-28 ENCOUNTER — Other Ambulatory Visit: Payer: Self-pay | Admitting: Hematology and Oncology

## 2022-10-28 DIAGNOSIS — C9000 Multiple myeloma not having achieved remission: Secondary | ICD-10-CM

## 2022-10-29 ENCOUNTER — Other Ambulatory Visit: Payer: Self-pay | Admitting: Hematology and Oncology

## 2022-10-29 DIAGNOSIS — C9000 Multiple myeloma not having achieved remission: Secondary | ICD-10-CM

## 2022-10-29 LAB — MULTIPLE MYELOMA PANEL, SERUM
Albumin SerPl Elph-Mcnc: 4 g/dL (ref 2.9–4.4)
Albumin/Glob SerPl: 1.5 (ref 0.7–1.7)
Alpha 1: 0.3 g/dL (ref 0.0–0.4)
Alpha2 Glob SerPl Elph-Mcnc: 0.9 g/dL (ref 0.4–1.0)
B-Globulin SerPl Elph-Mcnc: 1.3 g/dL (ref 0.7–1.3)
Gamma Glob SerPl Elph-Mcnc: 0.4 g/dL (ref 0.4–1.8)
Globulin, Total: 2.8 g/dL (ref 2.2–3.9)
IgA: 13 mg/dL — ABNORMAL LOW (ref 64–422)
IgG (Immunoglobin G), Serum: 659 mg/dL (ref 586–1602)
IgM (Immunoglobulin M), Srm: 17 mg/dL — ABNORMAL LOW (ref 26–217)
M Protein SerPl Elph-Mcnc: 0.2 g/dL — ABNORMAL HIGH
Total Protein ELP: 6.8 g/dL (ref 6.0–8.5)

## 2022-11-18 NOTE — H&P (Signed)
Patient's anticipated LOS is less than 2 midnights, meeting these requirements: - Younger than 33 - Lives within 1 hour of care - Has a competent adult at home to recover with post-op recover - NO history of  - Chronic pain requiring opiods  - Diabetes  - Coronary Artery Disease  - Heart failure  - Heart attack  - Stroke  - DVT/VTE  - Cardiac arrhythmia  - Respiratory Failure/COPD  - Renal failure  - Anemia  - Advanced Liver disease     [name] Page is an 89 y.o. female.    Chief Complaint: left shoulder pain  HPI: Pt is a 89 y.o. female complaining of left shoulder pain for multiple years. Pain had continually increased since the beginning. X-rays in the clinic show end-stage arthritic changes of the left shoulder. Pt has tried various conservative treatments which have failed to alleviate their symptoms, including injections and therapy. Various options are discussed with the patient. Risks, benefits and expectations were discussed with the patient. Patient understand the risks, benefits and expectations and wishes to proceed with surgery.   PCP:  Geoffry Paradise, MD  D/C Plans: Home  PMH: Past Medical History:  Diagnosis Date   Alopecia 10/28/2016   Anginal pain (HCC)    Arthritis    Cervical spondylosis    C3-4 AND C4-5   Chest pain 07/28/2008   H/O, normal stress nuclear EF 78%   Chronic cough 07/12/2014   Collapsed lung 07/2010   being treated at [facility] - in left lung   Complication of anesthesia    Cough 07/30/2010   Qualifier: Diagnosis of  By: Marchelle Gearing MD, Murali     Cystocele 05/23/2015   Cystocele, midline 05/02/2013   DEEP VEIN THROMBOSIS/PHLEBITIS 07/30/2010   Qualifier: History of  By: Yancey Flemings CMA, Jennifer     Dislocation closed, shoulder 02/2013   DVT (deep venous thrombosis) ([facility]) 2006   Encounter for therapeutic drug monitoring 10/03/2014   GERD (gastroesophageal reflux disease)    occ   Headache    hx   High cholesterol    History of  recurrent UTIs 09/24/2011   HYPERLIPIDEMIA 07/30/2010   Qualifier: Diagnosis of  By: Yancey Flemings CMA, Jennifer     Hypertension    HYPERTENSION 07/30/2010   Qualifier: Diagnosis of  By: Yancey Flemings CMA, Jennifer     Hypothyroidism    Nasal itching 01/30/2014   Osteopenia 05/23/2015   Peripheral vascular disease (HCC) 2006   dvt's and 50 yrs ago   Pleuritic pain 12/17/2015   PONV (postoperative nausea and vomiting)    Primary localized osteoarthritis of right hip 08/26/2016   Primary osteoarthritis of right hip 08/26/2016   Pulmonary collapse 07/30/2010   Qualifier: Diagnosis of  By: Marchelle Gearing MD, Murali     PULMONARY NODULE 07/30/2010   Qualifier: Diagnosis of  By: Marchelle Gearing MD, Murali     Rash and nonspecific skin eruption 01/02/2015   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   Sarcoidosis    Sarcoidosis of lung (HCC)    Vaginal atrophy 09/24/2011   Visual field defect nasal step 01/02/2015    PSH: Past Surgical History:  Procedure Laterality Date   BACK SURGERY  2022   EYE SURGERY     FOOT SURGERY Left 06/2005   bunion hammer toe   HEMORRHOID SURGERY  12/1972   INTRAOCULAR LENS INSERTION     right eye 10 /16 and left 06/16/2005   JOINT REPLACEMENT     KYPHOPLASTY N/A 06/27/2021   Procedure:  LUMBAR 3 AND LUMBAR  5 KYPHOPLASTY;  Surgeon: Estill Bamberg, MD;  [facility];  Service: Orthopedics;  Laterality: N/A;   KYPHOPLASTY N/A 09/04/2021   Procedure: THORACIC SEVEN, THORACIC EIGHT, THORACIC NINE, THORACIC TEN KYPHOPLASTY;  Surgeon: Estill Bamberg, MD;  Location: [facility];  Service: Orthopedics;  Laterality: N/A;   SKIN BIOPSY  01/2007   basal cell carcinoma removed from right lower leg    TOTAL HIP ARTHROPLASTY  06/2007   left   TOTAL HIP ARTHROPLASTY Right 08/26/2016   Procedure: TOTAL HIP ARTHROPLASTY ANTERIOR APPROACH;  Surgeon: Marcene Corning, MD;  Location: [facility];  Service: Orthopedics;  Laterality: Right;   TOTAL KNEE ARTHROPLASTY Right 10/2007   right    Social  History:  reports that she has never smoked. She has never used smokeless tobacco. She reports that she does not currently use alcohol. She reports that she does not use drugs. BMI: Estimated body mass index is 21.26 kg/m as calculated from the following:   Height as of 05/13/22: 5\' 1"  (1.549 m).   Weight as of 10/24/22: 51 kg.  Lab Results  Component Value Date   ALBUMIN 4.4 10/24/2022   Diabetes: Patient does not have a diagnosis of diabetes.     Smoking Status:   reports that she has never smoked. She has never used smokeless tobacco.    Allergies:  No Known Allergies  Medications: No current facility-administered medications for this encounter.   Current Outpatient Medications  Medication Sig Dispense Refill   acyclovir (ZOVIRAX) 400 MG tablet TAKE ONE (1) TABLET BY MOUTH TWO (2) TIMES DAILY 60 tablet 2   amLODipine (NORVASC) 10 MG tablet Take 10 mg by mouth daily.     b complex vitamins capsule Take 1 capsule by mouth daily.     dicyclomine (BENTYL) 10 MG capsule Take 1 capsule (10 mg total) by mouth 3 (three) times daily as needed for spasms. 90 capsule 0   gemfibrozil (LOPID) 600 MG tablet Take 600 mg by mouth 2 (two) times daily before a meal.     HYDROcodone-acetaminophen (NORCO/VICODIN) 5-325 MG tablet TAKE 1 TABLET BY MOUTH EVERY 6 HOURS AS NEEDED FOR MODERATE PAIN 30 tablet 0   levothyroxine (SYNTHROID) 50 MCG tablet Take 25 mcg by mouth daily.     LORazepam (ATIVAN) 0.5 MG tablet Take 0.5 mg by mouth daily as needed for anxiety or sleep.     losartan (COZAAR) 50 MG tablet Take 50 mg by mouth daily.     Multiple Vitamin (MULTIVITAMIN) capsule Take 1 capsule by mouth daily.     nitrofurantoin, macrocrystal-monohydrate, (MACROBID) 100 MG capsule Take 100 mg by mouth at bedtime.     ondansetron (ZOFRAN) 8 MG tablet Take 1 tablet (8 mg total) by mouth every 8 (eight) hours as needed. 30 tablet 0   pantoprazole (PROTONIX) 20 MG tablet Take 20 mg by mouth daily.      polyethylene glycol powder (GLYCOLAX/MIRALAX) 17 GM/SCOOP powder Take 17 g by mouth at bedtime.     pregabalin (LYRICA) 25 MG capsule TAKE ONE (1) CAPSULE BY MOUTH 2 TIMES DAILY 60 capsule 0    No results found for this or any previous visit (from the past 48 hour(s)). No results found.  ROS: Pain with rom of the left upper extremity  Physical Exam: Alert and oriented 89 y.o. female in no acute distress Cranial nerves 2-12 intact Cervical spine: full rom with no tenderness, nv intact distally Chest: active breath sounds bilaterally, no wheeze  rhonchi or rales Heart: regular rate and rhythm, no murmur Abd: non tender non distended with active bowel sounds Hip is stable with rom  Left shoulder painful rom with crepitus Nv intact distally Weakness with ER and IR No rashes or edema distally  Assessment/Plan Assessment: left shoulder rotator cuff arthropathy, AC arthrosis  Plan:  Patient will undergo a left reverse total shoulder and open dcr [facility] Risks benefits and expectations were discussed with the patient. Patient understand risks, benefits and expectations and wishes to proceed. Preoperative templating of the joint replacement has been completed, documented, and submitted to the Operating Room personnel in order to optimize intra-operative equipment management.   Alphonsa OverallBrad Mohammedali Bedoy PA-C, MPAS [facility] now [facility]  [place] [address], [place], [place] [address] Phone: [phone] [email or web address] Facebook  [employer]  LinkedIn  Twitter

## 2022-11-21 ENCOUNTER — Telehealth: Payer: Self-pay | Admitting: Hematology and Oncology

## 2022-11-21 ENCOUNTER — Inpatient Hospital Stay: Payer: Medicare PPO | Admitting: Hematology and Oncology

## 2022-11-21 ENCOUNTER — Inpatient Hospital Stay: Payer: Medicare PPO | Attending: Hematology and Oncology

## 2022-11-21 ENCOUNTER — Inpatient Hospital Stay: Payer: Medicare PPO

## 2022-11-21 VITALS — BP 129/66 | HR 75 | Temp 98.0°F | Resp 15 | Wt 112.1 lb

## 2022-11-21 DIAGNOSIS — C9000 Multiple myeloma not having achieved remission: Secondary | ICD-10-CM

## 2022-11-21 DIAGNOSIS — Z800 Family history of malignant neoplasm of digestive organs: Secondary | ICD-10-CM | POA: Diagnosis not present

## 2022-11-21 DIAGNOSIS — D649 Anemia, unspecified: Secondary | ICD-10-CM

## 2022-11-21 DIAGNOSIS — T451X5D Adverse effect of antineoplastic and immunosuppressive drugs, subsequent encounter: Secondary | ICD-10-CM | POA: Insufficient documentation

## 2022-11-21 DIAGNOSIS — E538 Deficiency of other specified B group vitamins: Secondary | ICD-10-CM | POA: Diagnosis present

## 2022-11-21 DIAGNOSIS — G620 Drug-induced polyneuropathy: Secondary | ICD-10-CM | POA: Diagnosis not present

## 2022-11-21 DIAGNOSIS — Z5112 Encounter for antineoplastic immunotherapy: Secondary | ICD-10-CM | POA: Insufficient documentation

## 2022-11-21 DIAGNOSIS — G629 Polyneuropathy, unspecified: Secondary | ICD-10-CM | POA: Diagnosis not present

## 2022-11-21 DIAGNOSIS — Z79620 Long term (current) use of immunosuppressive biologic: Secondary | ICD-10-CM | POA: Insufficient documentation

## 2022-11-21 DIAGNOSIS — Z79899 Other long term (current) drug therapy: Secondary | ICD-10-CM | POA: Diagnosis not present

## 2022-11-21 LAB — CMP (CANCER CENTER ONLY)
ALT: 9 U/L (ref 0–44)
AST: 20 U/L (ref 15–41)
Albumin: 4.4 g/dL (ref 3.5–5.0)
Alkaline Phosphatase: 86 U/L (ref 38–126)
Anion gap: 7 (ref 5–15)
BUN: 19 mg/dL (ref 8–23)
CO2: 24 mmol/L (ref 22–32)
Calcium: 9.1 mg/dL (ref 8.9–10.3)
Chloride: 102 mmol/L (ref 98–111)
Creatinine: 1.01 mg/dL — ABNORMAL HIGH (ref 0.44–1.00)
GFR, Estimated: 53 mL/min — ABNORMAL LOW (ref >60)
Glucose, Bld: 106 mg/dL — ABNORMAL HIGH (ref 70–99)
Potassium: 4.1 mmol/L (ref 3.5–5.1)
Sodium: 133 mmol/L — ABNORMAL LOW (ref 135–145)
Total Bilirubin: 0.3 mg/dL (ref 0.3–1.2)
Total Protein: 7 g/dL (ref 6.5–8.1)

## 2022-11-21 LAB — CBC WITH DIFFERENTIAL (CANCER CENTER ONLY)
Abs Immature Granulocytes: 0.02 10*3/uL (ref 0.00–0.07)
Basophils Absolute: 0 10*3/uL (ref 0.0–0.1)
Basophils Relative: 0 %
Eosinophils Absolute: 0.1 10*3/uL (ref 0.0–0.5)
Eosinophils Relative: 1 %
HCT: 29.3 % — ABNORMAL LOW (ref 36.0–46.0)
Hemoglobin: 10.4 g/dL — ABNORMAL LOW (ref 12.0–15.0)
Immature Granulocytes: 0 %
Lymphocytes Relative: 23 %
Lymphs Abs: 1.3 10*3/uL (ref 0.7–4.0)
MCH: 33.4 pg (ref 26.0–34.0)
MCHC: 35.5 g/dL (ref 30.0–36.0)
MCV: 94.2 fL (ref 80.0–100.0)
Monocytes Absolute: 0.5 10*3/uL (ref 0.1–1.0)
Monocytes Relative: 9 %
Neutro Abs: 3.8 10*3/uL (ref 1.7–7.7)
Neutrophils Relative %: 67 %
Platelet Count: 263 10*3/uL (ref 150–400)
RBC: 3.11 MIL/uL — ABNORMAL LOW (ref 3.87–5.11)
RDW: 14 % (ref 11.5–15.5)
WBC Count: 5.7 10*3/uL (ref 4.0–10.5)
nRBC: 0 % (ref 0.0–0.2)

## 2022-11-21 LAB — LACTATE DEHYDROGENASE: LDH: 166 U/L (ref 98–192)

## 2022-11-21 MED ORDER — ACETAMINOPHEN 325 MG PO TABS
650.0000 mg | ORAL_TABLET | Freq: Once | ORAL | Status: AC
  Administered 2022-11-21: 650 mg via ORAL
  Filled 2022-11-21: qty 2

## 2022-11-21 MED ORDER — DEXAMETHASONE 4 MG PO TABS
20.0000 mg | ORAL_TABLET | Freq: Once | ORAL | Status: AC
  Administered 2022-11-21: 20 mg via ORAL
  Filled 2022-11-21: qty 5

## 2022-11-21 MED ORDER — DARATUMUMAB-HYALURONIDASE-FIHJ 1800-30000 MG-UT/15ML ~~LOC~~ SOLN
1800.0000 mg | Freq: Once | SUBCUTANEOUS | Status: AC
  Administered 2022-11-21: 1800 mg via SUBCUTANEOUS
  Filled 2022-11-21: qty 15

## 2022-11-21 MED ORDER — DIPHENHYDRAMINE HCL 25 MG PO CAPS
50.0000 mg | ORAL_CAPSULE | Freq: Once | ORAL | Status: AC
  Administered 2022-11-21: 50 mg via ORAL
  Filled 2022-11-21: qty 2

## 2022-11-21 MED ORDER — CYANOCOBALAMIN 1000 MCG/ML IJ SOLN
1000.0000 ug | Freq: Once | INTRAMUSCULAR | Status: AC
  Administered 2022-11-21: 1000 ug via INTRAMUSCULAR
  Filled 2022-11-21: qty 1

## 2022-11-21 NOTE — Telephone Encounter (Signed)
Reached out to patient to schedule per WQ left voicemail. 

## 2022-11-21 NOTE — Telephone Encounter (Signed)
Reached out to patient to schedule per WQ, Left voicemail.

## 2022-11-21 NOTE — Progress Notes (Signed)
[facility] Telephone:([phone]   Fax:([phone]) [phone]  PROGRESS NOTE  Patient Care Team: Geoffry Paradise, MD as PCP - General (Internal Medicine)  Hematological/Oncological History # IgG Lambda Multiple Myeloma 06/04/2021: WBC 5.5, Hgb 11.1, MCV 101.3, Plt 306 07/24/2021: M protein 2.3, IFE shows IgG Lambda monoclonal specificity. Cr 0.9 08/07/2021: Establish care with Dr. Leonides Schanz 09/27/2021: Bmbx showed hypercellular bone marrow with plasma cell neoplasm, increased number of atypical plasma cells averaging 20% of all cells in the aspirate 10/18/2021: Cycle 1 of Velcade/Dexamethasone 11/08/2021: Cycle 2 of Velcade/Dexamethasone 11/29/2021: Cycle 3 of Velcade/Dexamethasone 12/20/2021: Cycle 4 of Velcade/Dexamethasone 01/10/2022: Cycle 5 of Velcade/Dexamethasone 01/31/2022: Cycle 6 of Velcade/Dexamethasone 02/28/2022: Cycle 7 of Velcade/Dexamethasone. Dose reduced velcade due to neuropathy  03/14/2022: Cycle 1 Day 1 of Dara/Dex (velcade d/c due to neuropathy).  04/11/2022: Cycle 2 Day 1 of Dara/Dex 05/09/2022: Cycle 3 Day 1 of Dara/Dex 06/06/2022: Cycle 4 Day 1 of Dara/Dex 07/04/2022: Cycle 5 Day 1 of Dara/Dex 08/01/2022: Cycle 6 Day 1 of Dara/Dex 08/29/2022: Cycle 7 Day 1 of Dara/Dex 09/26/2022: Cycle 8 Day 1 of Dara/Dex 10/24/2022: Cycle 9 Day 1 of Dara/Dex 11/21/2022: Cycle 10 Day 1 of Dara/Dex  Interval History:  [name] Page 89 y.o. female with medical history significant for  IgG lambda multiple myeloma who presents for a follow up visit. The patient's last visit was on 10/24/2022. In the interim, patient has had no major changes in her health. She is due for Cycle 10, Day 1.  On exam today [name] reports she has been well overall in the interim since her last visit.  She is tolerating the Darzalex without any difficulty.  She is not having any itching, rash, redness at the injection site.  She reports that she does have an upcoming surgery in about 2 weeks time in  order to work on her damaged right shoulder.  She notes that she is nervous about the upcoming procedure and is unsure if she should have agreed to surgical intervention.  She reports that other than that discomfort she has been at her baseline level of health with no recent illnesses or changes in medications.  She denies fevers, chills, night sweats, shortness of breath, chest pain or cough.  She has no other complaints. Rest of the 10 point ROS is below.  MEDICAL HISTORY:  Past Medical History:  Diagnosis Date   Alopecia 10/28/2016   Anginal pain (HCC)    Arthritis    Cervical spondylosis    C3-4 AND C4-5   Chest pain 07/28/2008   H/O, normal stress nuclear EF 78%   Chronic cough 07/12/2014   Collapsed lung 07/2010   being treated at [facility] - in left lung   Complication of anesthesia    Cough 07/30/2010   Qualifier: Diagnosis of  By: Marchelle Gearing MD, Murali     Cystocele 05/23/2015   Cystocele, midline 05/02/2013   DEEP VEIN THROMBOSIS/PHLEBITIS 07/30/2010   Qualifier: History of  By: Yancey Flemings CMA, Jennifer     Dislocation closed, shoulder 02/2013   DVT (deep venous thrombosis) (HCC) 2006   Encounter for therapeutic drug monitoring 10/03/2014   GERD (gastroesophageal reflux disease)    occ   Headache    hx   High cholesterol    History of recurrent UTIs 09/24/2011   HYPERLIPIDEMIA 07/30/2010   Qualifier: Diagnosis of  By: Yancey Flemings CMA, Jennifer     Hypertension    HYPERTENSION 07/30/2010   Qualifier: Diagnosis of  By: Yancey Flemings CMA, Victorino Dike  Hypothyroidism    Nasal itching 01/30/2014   Osteopenia 05/23/2015   Peripheral vascular disease (HCC) 2006   dvt's and 50 yrs ago   Pleuritic pain 12/17/2015   PONV (postoperative nausea and vomiting)    Primary localized osteoarthritis of right hip 08/26/2016   Primary osteoarthritis of right hip 08/26/2016   Pulmonary collapse 07/30/2010   Qualifier: Diagnosis of  By: Marchelle Gearing MD, Murali     PULMONARY NODULE 07/30/2010    Qualifier: Diagnosis of  By: Marchelle Gearing MD, Murali     Rash and nonspecific skin eruption 01/02/2015   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   Sarcoidosis    Sarcoidosis of lung (HCC)    Vaginal atrophy 09/24/2011   Visual field defect nasal step 01/02/2015    SURGICAL HISTORY: Past Surgical History:  Procedure Laterality Date   BACK SURGERY  2022   EYE SURGERY     FOOT SURGERY Left 06/2005   bunion hammer toe   HEMORRHOID SURGERY  12/1972   INTRAOCULAR LENS INSERTION     right eye 10 /16 and left 06/16/2005   JOINT REPLACEMENT     KYPHOPLASTY N/A 06/27/2021   Procedure: LUMBAR 3 AND LUMBAR  5 KYPHOPLASTY;  Surgeon: Estill Bamberg, MD;  Location: [facility] OR;  Service: Orthopedics;  Laterality: N/A;   KYPHOPLASTY N/A 09/04/2021   Procedure: THORACIC SEVEN, THORACIC EIGHT, THORACIC NINE, THORACIC TEN KYPHOPLASTY;  Surgeon: Estill Bamberg, MD;  Location: [facility] OR;  Service: Orthopedics;  Laterality: N/A;   SKIN BIOPSY  01/2007   basal cell carcinoma removed from right lower leg    TOTAL HIP ARTHROPLASTY  06/2007   left   TOTAL HIP ARTHROPLASTY Right 08/26/2016   Procedure: TOTAL HIP ARTHROPLASTY ANTERIOR APPROACH;  Surgeon: Marcene Corning, MD;  Location: [facility] OR;  Service: Orthopedics;  Laterality: Right;   TOTAL KNEE ARTHROPLASTY Right 10/2007   right    SOCIAL HISTORY: Social History   Socioeconomic History   Marital status: Married    Spouse name: Not on file   Number of children: Not on file   Years of education: Not on file   Highest education level: Not on file  Occupational History   Occupation: retired    Associate Professor: RETIRED  Tobacco Use   Smoking status: Never   Smokeless tobacco: Never  Vaping Use   Vaping Use: Never used  Substance and Sexual Activity   Alcohol use: Not Currently    Comment: BEER/glass of wine A DAY   Drug use: No   Sexual activity: Never  Other Topics Concern   Not on file  Social History Narrative   Not on file   Social Determinants of Health    Financial Resource Strain: Not on file  Food Insecurity: Not on file  Transportation Needs: Not on file  Physical Activity: Not on file  Stress: Not on file  Social Connections: Not on file  Intimate Partner Violence: Not on file    FAMILY HISTORY: Family History  Problem Relation Age of Onset   Diabetes Mother    Heart failure Mother    Hypertension Father    Diabetes Father    Heart disease Father    Heart attack Father     ALLERGIES:  has No Known Allergies.  MEDICATIONS:  Current Outpatient Medications  Medication Sig Dispense Refill   acyclovir (ZOVIRAX) 400 MG tablet TAKE ONE (1) TABLET BY MOUTH TWO (2) TIMES DAILY 60 tablet 2   amLODipine (NORVASC) 10 MG tablet Take 10 mg by mouth  daily.     b complex vitamins capsule Take 1 capsule by mouth daily.     dicyclomine (BENTYL) 10 MG capsule Take 1 capsule (10 mg total) by mouth 3 (three) times daily as needed for spasms. 90 capsule 0   gemfibrozil (LOPID) 600 MG tablet Take 600 mg by mouth 2 (two) times daily before a meal.     HYDROcodone-acetaminophen (NORCO/VICODIN) 5-325 MG tablet TAKE 1 TABLET BY MOUTH EVERY 6 HOURS AS NEEDED FOR MODERATE PAIN 30 tablet 0   levothyroxine (SYNTHROID) 50 MCG tablet Take 25 mcg by mouth daily.     LORazepam (ATIVAN) 0.5 MG tablet Take 0.5 mg by mouth daily as needed for anxiety or sleep.     losartan (COZAAR) 50 MG tablet Take 50 mg by mouth daily.     Multiple Vitamin (MULTIVITAMIN) capsule Take 1 capsule by mouth daily.     nitrofurantoin, macrocrystal-monohydrate, (MACROBID) 100 MG capsule Take 100 mg by mouth at bedtime.     ondansetron (ZOFRAN) 8 MG tablet Take 1 tablet (8 mg total) by mouth every 8 (eight) hours as needed. 30 tablet 0   pantoprazole (PROTONIX) 20 MG tablet Take 20 mg by mouth daily.     polyethylene glycol powder (GLYCOLAX/MIRALAX) 17 GM/SCOOP powder Take 17 g by mouth at bedtime.     pregabalin (LYRICA) 25 MG capsule TAKE ONE (1) CAPSULE BY MOUTH 2 TIMES DAILY  60 capsule 0   No current facility-administered medications for this visit.    REVIEW OF SYSTEMS:   Constitutional: ( - ) fevers, ( - )  chills , ( - ) night sweats Eyes: ( - ) blurriness of vision, ( - ) double vision, ( - ) watery eyes Ears, nose, mouth, throat, and face: ( - ) mucositis, ( - ) sore throat Respiratory: ( - ) cough, ( - ) dyspnea, ( - ) wheezes Cardiovascular: ( - ) palpitation, ( - ) chest discomfort, ( - ) lower extremity swelling Gastrointestinal:  ( - ) nausea, ( - ) heartburn, ( - ) change in bowel habits Skin: ( - ) abnormal skin rashes Lymphatics: ( - ) new lymphadenopathy, ( - ) easy bruising Neurological: (+ ) numbness, ( +) tingling, ( - ) new weaknesses Behavioral/Psych: ( - ) mood change, ( - ) new changes  All other systems were reviewed with the patient and are negative.  PHYSICAL EXAMINATION: ECOG PERFORMANCE STATUS: 1 - Symptomatic but completely ambulatory  Vitals:   11/21/22 1402  BP: 129/66  Pulse: 75  Resp: 15  Temp: 98 F (36.7 C)  SpO2: 98%     Filed Weights   11/21/22 1402  Weight: 112 lb 1.6 oz (50.8 kg)    GENERAL: Well-appearing elderly Caucasian female, alert, no distress and comfortable SKIN: skin color, texture, turgor are normal, no rashes or significant lesions EYES: conjunctiva are pink and non-injected, sclera clear LUNGS: clear to auscultation and percussion with normal breathing effort HEART: regular rate & rhythm and no murmurs and no lower extremity edema Musculoskeletal: no cyanosis of digits and no clubbing  PSYCH: alert & oriented x 3, fluent speech NEURO: no focal motor/sensory deficits  LABORATORY DATA:  I have reviewed the data as listed    Latest Ref Rng & Units 11/21/2022    1:37 PM 10/24/2022   12:59 PM 09/26/2022   12:09 PM  CBC  WBC 4.0 - 10.5 K/uL 5.7  4.0  5.1   Hemoglobin 12.0 - 15.0 g/dL 40.9  81.1  11.0   Hematocrit 36.0 - 46.0 % 29.3  30.9  31.3   Platelets 150 - 400 K/uL 263  252  247         Latest Ref Rng & Units 11/21/2022    1:37 PM 10/24/2022   12:59 PM 09/26/2022   12:09 PM  CMP  Glucose 70 - 99 mg/dL 826  415  830   BUN 8 - 23 mg/dL 19  16  15    Creatinine 0.44 - 1.00 mg/dL 9.40  7.68  0.88   Sodium 135 - 145 mmol/L 133  131  133   Potassium 3.5 - 5.1 mmol/L 4.1  4.0  4.1   Chloride 98 - 111 mmol/L 102  100  103   CO2 22 - 32 mmol/L 24  23  21    Calcium 8.9 - 10.3 mg/dL 9.1  9.3  9.5   Total Protein 6.5 - 8.1 g/dL 7.0  7.1  7.4   Total Bilirubin 0.3 - 1.2 mg/dL 0.3  0.3  0.4   Alkaline Phos 38 - 126 U/L 86  89  89   AST 15 - 41 U/L 20  19  19    ALT 0 - 44 U/L 9  8  8      Lab Results  Component Value Date   MPROTEIN 0.2 (H) 10/24/2022   MPROTEIN 0.2 (H) 09/26/2022   MPROTEIN 0.1 (H) 08/29/2022   Lab Results  Component Value Date   KPAFRELGTCHN 7.5 10/24/2022   KPAFRELGTCHN 8.3 09/26/2022   KPAFRELGTCHN 8.5 08/29/2022   LAMBDASER 6.7 10/24/2022   LAMBDASER 7.1 09/26/2022   LAMBDASER 6.3 08/29/2022   KAPLAMBRATIO 1.12 10/24/2022   KAPLAMBRATIO 1.17 09/26/2022   KAPLAMBRATIO 1.35 08/29/2022   RADIOGRAPHIC STUDIES: No results found.  ASSESSMENT & PLAN LINDIA Page 89 y.o. female with medical history significant for newly diagnosed IgG lambda multiple myeloma who presents for a follow up visit.  #IgG Lambda Multiple Myeloma -- Diagnosis confirmed by bone marrow biopsy on 09/27/2021 which shows evidence of 20% involvement by plasma cells.  Additionally has anemia, macrocytosis, leukopenia, and concern for lytic lesions on bone imaging --Prognostic panel shows a deletion 17 with a TP53 mutation --Started Cycle 1 Day 1 of Velcade/Dex on 10/18/2021 --Switched to Dara/Dex on 03/14/2022 due to neuropathy secondary to Velcade.  Plan: --Due for Cycle 10 Day 1 of Dara/Dex today  --Labs from 09/26/2022 showed M protein 0.2, kappa and lambda light chains normal. Today's myeloma labs pending.  --Labs today show white blood cell count 5.7, hemoglobin 10.4, MCV  94.2, and platelets of 263. --if Hgb remains consistently >10 can consider the addition of revlimid.  --RTC in 4 weeks with labs, follow up visit and Dara/Dex treatments.   # H/O Confusion --decreased dexamethasone to 20 mg on infusion days.  --Symptoms resolved.   #Leukopenia/Macrocytic anemia/Vitamin B12 deficiency: --Secondary to chemotherapy and multiple myeloma --Received vitamin B12 injection 1000 mcg weekly x 4 doses. Continue q 4 weeks.  -- continue p.o. supplementation.  #Right rib pain, improved: --CT chest from 10/25/2021 that didn't show any new or progressive disease.   #Abdominal cramping--resolved: --Symptoms occur after velcade injection.  --Has Bentyl as needed  #Neuropathy: --Grade 2 involving finger tips and feet --Secondary to velcade  --Currently on Lyrica 25 mg PO BID.   #Supportive Care -- chemotherapy education complete -- port placement not required.  -- zofran 8mg  q8H PRN and compazine 10mg  PO q6H for nausea -- acyclovir 400mg  PO BID for VCZ prophylaxis.  Patient was not taking medication as prescribed.  -- Await dental clearance to start Zometa  Orders Placed This Encounter  Procedures   CMP (Cancer Center only)    Standing Status:   Future    Standing Expiration Date:   12/19/2023   Multiple Myeloma Panel (SPEP&IFE w/QIG)    Standing Status:   Future    Standing Expiration Date:   12/19/2023   Lactate dehydrogenase (LDH)    Standing Status:   Future    Standing Expiration Date:   12/19/2023   Kappa/lambda light chains    Standing Status:   Future    Standing Expiration Date:   12/19/2023   CBC with Differential (Cancer Center Only)    Standing Status:   Future    Standing Expiration Date:   12/19/2023   CMP (Cancer Center only)    Standing Status:   Future    Standing Expiration Date:   01/16/2024   Multiple Myeloma Panel (SPEP&IFE w/QIG)    Standing Status:   Future    Standing Expiration Date:   01/16/2024   Lactate dehydrogenase (LDH)     Standing Status:   Future    Standing Expiration Date:   01/16/2024   Kappa/lambda light chains    Standing Status:   Future    Standing Expiration Date:   01/16/2024   CBC with Differential (Cancer Center Only)    Standing Status:   Future    Standing Expiration Date:   01/16/2024   CMP (Cancer Center only)    Standing Status:   Future    Standing Expiration Date:   02/13/2024   Multiple Myeloma Panel (SPEP&IFE w/QIG)    Standing Status:   Future    Standing Expiration Date:   02/13/2024   Lactate dehydrogenase (LDH)    Standing Status:   Future    Standing Expiration Date:   02/13/2024   Kappa/lambda light chains    Standing Status:   Future    Standing Expiration Date:   02/13/2024   CBC with Differential (Cancer Center Only)    Standing Status:   Future    Standing Expiration Date:   02/13/2024   All questions were answered. The patient knows to call the clinic with any problems, questions or concerns.  I have spent a total of 30 minutes minutes of face-to-face and non-face-to-face time, preparing to see the patient, performing a medically appropriate examination, counseling and educating the patient, ordering medications, documenting clinical information in the electronic health record,  and care coordination.   Ulysees Barns, MD Department of Hematology/Oncology [facility] Phone: [phone] Pager: [phone] Email: [email or web address]   11/22/2022 4:39 PM

## 2022-11-21 NOTE — Patient Instructions (Signed)
[facility]  Discharge Instructions: Thank you for choosing [facility] to provide your oncology and hematology care.   If you have a lab appointment with the Cancer Center, please go directly to the Cancer Center and check in at the registration area.   Wear comfortable clothing and clothing appropriate for easy access to any Portacath or PICC line.   We strive to give you quality time with your provider. You may need to reschedule your appointment if you arrive late (15 or more minutes).  Arriving late affects you and other patients whose appointments are after yours.  Also, if you miss three or more appointments without notifying the office, you may be dismissed from the clinic at the provider's discretion.      For prescription refill requests, have your pharmacy contact our office and allow 72 hours for refills to be completed.    Today you received the following chemotherapy and/or immunotherapy agents Darzalex Faspro      To help prevent nausea and vomiting after your treatment, we encourage you to take your nausea medication as directed.  BELOW ARE SYMPTOMS THAT SHOULD BE REPORTED IMMEDIATELY: *FEVER GREATER THAN 100.4 F (38 C) OR HIGHER *CHILLS OR SWEATING *NAUSEA AND VOMITING THAT IS NOT CONTROLLED WITH YOUR NAUSEA MEDICATION *UNUSUAL SHORTNESS OF BREATH *UNUSUAL BRUISING OR BLEEDING *URINARY PROBLEMS (pain or burning when urinating, or frequent urination) *BOWEL PROBLEMS (unusual diarrhea, constipation, pain near the anus) TENDERNESS IN MOUTH AND THROAT WITH OR WITHOUT PRESENCE OF ULCERS (sore throat, sores in mouth, or a toothache) UNUSUAL RASH, SWELLING OR PAIN  UNUSUAL VAGINAL DISCHARGE OR ITCHING   Items with * indicate a potential emergency and should be followed up as soon as possible or go to the Emergency Department if any problems should occur.  Please show the CHEMOTHERAPY ALERT CARD or IMMUNOTHERAPY ALERT CARD at  check-in to the Emergency Department and triage nurse.  Should you have questions after your visit or need to cancel or reschedule your appointment, please contact [facility]  Dept: [phone]  and follow the prompts.  Office hours are 8:00 a.m. to 4:30 p.m. Monday - Friday. Please note that voicemails left after 4:00 p.m. may not be returned until the following business day.  We are closed weekends and major holidays. You have access to a nurse at all times for urgent questions. Please call the main number to the clinic Dept: [phone] and follow the prompts.   For any non-urgent questions, you may also contact your provider using MyChart. We now offer e-Visits for anyone 18 and older to request care online for non-urgent symptoms. For details visit [email or web address].   Also download the MyChart app! Go to the app store, search "MyChart", open the app, select [facility], and log in with your MyChart username and password.  

## 2022-11-22 ENCOUNTER — Encounter: Payer: Self-pay | Admitting: Hematology and Oncology

## 2022-11-23 ENCOUNTER — Other Ambulatory Visit: Payer: Self-pay

## 2022-11-24 ENCOUNTER — Other Ambulatory Visit: Payer: Self-pay | Admitting: Hematology and Oncology

## 2022-11-24 ENCOUNTER — Telehealth: Payer: Self-pay | Admitting: *Deleted

## 2022-11-24 ENCOUNTER — Encounter: Payer: Self-pay | Admitting: *Deleted

## 2022-11-24 ENCOUNTER — Telehealth: Payer: Self-pay | Admitting: Physician Assistant

## 2022-11-24 ENCOUNTER — Other Ambulatory Visit: Payer: Self-pay | Admitting: Physician Assistant

## 2022-11-24 DIAGNOSIS — C9000 Multiple myeloma not having achieved remission: Secondary | ICD-10-CM

## 2022-11-24 LAB — KAPPA/LAMBDA LIGHT CHAINS
Kappa free light chain: 8.5 mg/L (ref 3.3–19.4)
Kappa, lambda light chain ratio: 1.16 (ref 0.26–1.65)
Lambda free light chains: 7.3 mg/L (ref 5.7–26.3)

## 2022-11-24 NOTE — Telephone Encounter (Signed)
Reached out to patient regarding appointments on 5/10. Patient decided to keep appointment times.

## 2022-11-24 NOTE — Telephone Encounter (Signed)
Received call from pt requesting to have all her appts on 12/19/22 be moved to later in the day. She is having shoulder surgery 2 weeks prior to this appt and will need more time to get ready for her appts here. Scheduling message sent

## 2022-11-25 ENCOUNTER — Telehealth: Payer: Self-pay | Admitting: Hematology and Oncology

## 2022-11-25 NOTE — Telephone Encounter (Signed)
patient called to see if she could move her appointment to Thursday, Karena Addison isnt available patient is aware.

## 2022-11-26 LAB — MULTIPLE MYELOMA PANEL, SERUM
Albumin SerPl Elph-Mcnc: 3.8 g/dL (ref 2.9–4.4)
Albumin/Glob SerPl: 1.6 (ref 0.7–1.7)
Alpha 1: 0.3 g/dL (ref 0.0–0.4)
Alpha2 Glob SerPl Elph-Mcnc: 0.8 g/dL (ref 0.4–1.0)
B-Globulin SerPl Elph-Mcnc: 0.9 g/dL (ref 0.7–1.3)
Gamma Glob SerPl Elph-Mcnc: 0.5 g/dL (ref 0.4–1.8)
Globulin, Total: 2.5 g/dL (ref 2.2–3.9)
IgA: 12 mg/dL — ABNORMAL LOW (ref 64–422)
IgG (Immunoglobin G), Serum: 663 mg/dL (ref 586–1602)
IgM (Immunoglobulin M), Srm: 16 mg/dL — ABNORMAL LOW (ref 26–217)
M Protein SerPl Elph-Mcnc: 0.2 g/dL — ABNORMAL HIGH
Total Protein ELP: 6.3 g/dL (ref 6.0–8.5)

## 2022-11-26 NOTE — Patient Instructions (Signed)
DUE TO COVID-19 ONLY TWO VISITORS  (aged 87 and older)  ARE ALLOWED TO COME WITH YOU AND STAY IN THE WAITING ROOM ONLY DURING PRE OP AND PROCEDURE.   **NO VISITORS ARE ALLOWED IN THE SHORT STAY AREA OR RECOVERY ROOM!!**  IF YOU WILL BE ADMITTED INTO THE HOSPITAL YOU ARE ALLOWED ONLY FOUR SUPPORT PEOPLE DURING VISITATION HOURS ONLY (7 AM -8PM)   The support person(s) must pass our screening, gel in and out, and wear a mask at all times, including in the patient's room. Patients must also wear a mask when staff or their support person are in the room. Visitors GUEST BADGE MUST BE WORN VISIBLY  One adult visitor may remain with you overnight and MUST be in the room by 8 P.M.     Your procedure is scheduled on: 12/05/22   Report to [facility] Main Entrance    Report to admitting at : 5:15 AM   Call this number if you have problems the morning of surgery [phone]   Do not eat food :After Midnight.   After Midnight you may have the following liquids until : 4:00 AM DAY OF SURGERY  Water Black Coffee (sugar ok, NO MILK/CREAM OR CREAMERS)  Tea (sugar ok, NO MILK/CREAM OR CREAMERS) regular and decaf                             Plain Jell-O (NO RED)                                           Fruit ices (not with fruit pulp, NO RED)                                     Popsicles (NO RED)                                                                  Juice: apple, WHITE grape, WHITE cranberry Sports drinks like Gatorade (NO RED)   The day of surgery:  Drink ONE (1) Pre-Surgery Clear Ensure at: 4:00 AM the morning of surgery. Drink in one sitting. Do not sip.  This drink was given to you during your hospital  pre-op appointment visit. Nothing else to drink after completing the  Pre-Surgery Clear Ensure or G2.          If you have questions, please contact your surgeon's office.   Oral Hygiene is also important to reduce your risk of infection.                                     Remember - BRUSH YOUR TEETH THE MORNING OF SURGERY WITH YOUR REGULAR TOOTHPASTE  DENTURES WILL BE REMOVED PRIOR TO SURGERY PLEASE DO NOT APPLY "Poly grip" OR ADHESIVES!!!   Do NOT smoke after Midnight   Take these medicines the morning of surgery with A SIP OF WATER: pregabalin,amlodipine.Eye drops as usual.Hydrocodone as needed.  You may not have any metal on your body including hair pins, jewelry, and body piercing             Do not wear make-up, lotions, powders, perfumes/cologne, or deodorant  Do not wear nail polish including gel and S&S, artificial/acrylic nails, or any other type of covering on natural nails including finger and toenails. If you have artificial nails, gel coating, etc. that needs to be removed by a nail salon please have this removed prior to surgery or surgery may need to be canceled/ delayed if the surgeon/ anesthesia feels like they are unable to be safely monitored.   Do not shave  48 hours prior to surgery.    Do not bring valuables to the hospital. [place].   Contacts, glasses, or bridgework may not be worn into surgery.   Bring small overnight bag day of surgery.   DO NOT [place]ach[place]. PHARMACY WILL DISPENSE MEDICATIONS LISTED ON YOUR MEDICATION LIST TO YOU DURING YOUR ADMISSION [place]!    Patients discharged on the day of surgery will not be allowed to drive home.  Someone NEEDS to stay with you for the first 24 hours after anesthesia.   Special Instructions: Bring a copy of your healthcare power of attorney and living will documents         the day of surgery if you haven't scanned them before.              Please read over the following fact sheets you were given: IF YOU HAVE QUESTIONS ABOUT YOUR PRE-OP INSTRUCTIONS PLEASE CALL [phone]    [facility] - Preparing for Surgery Before surgery, you can play an important role.   Because skin is not sterile, your skin needs to be as free of germs as possible.  You can reduce the number of germs on your skin by washing with CHG (chlorahexidine gluconate) soap before surgery.  CHG is an antiseptic cleaner which kills germs and bonds with the skin to continue killing germs even after washing. Please DO NOT use if you have an allergy to CHG or antibacterial soaps.  If your skin becomes reddened/irritated stop using the CHG and inform your nurse when you arrive at Short Stay. Do not shave (including legs and underarms) for at least 48 hours prior to the first CHG shower.  You may shave your face/neck. Please follow these instructions carefully:  1.  Shower with CHG Soap the night before surgery and the  morning of Surgery.  2.  If you choose to wash your hair, wash your hair first as usual with your  normal  shampoo.  3.  After you shampoo, rinse your hair and body thoroughly to remove the  shampoo.                           4.  Use CHG as you would any other liquid soap.  You can apply chg directly  to the skin and wash                       Gently with a scrungie or clean washcloth.  5.  Apply the CHG Soap to your body ONLY FROM THE NECK DOWN.   Do not use on face/ open  Wound or open sores. Avoid contact with eyes, ears mouth and genitals (private parts).                       Wash face,  Genitals (private parts) with your normal soap.             6.  Wash thoroughly, paying special attention to the area where your surgery  will be performed.  7.  Thoroughly rinse your body with warm water from the neck down.  8.  DO NOT shower/wash with your normal soap after using and rinsing off  the CHG Soap.                9.  Pat yourself dry with a clean towel.            10.  Wear clean pajamas.            11.  Place clean sheets on your bed the night of your first shower and do not  sleep with pets. Day of Surgery : Do not apply any lotions/deodorants the  morning of surgery.  Please wear clean clothes to the hospital/surgery center.  FAILURE TO FOLLOW THESE INSTRUCTIONS MAY RESULT IN THE CANCELLATION OF YOUR SURGERY PATIENT SIGNATURE_________________________________  NURSE SIGNATURE__________________________________  ________________________________________________________________________  [name] Page  An incentive spirometer is a tool that can help keep your lungs clear and active. This tool measures how well you are filling your lungs with each breath. Taking long deep breaths may help reverse or decrease the chance of developing breathing (pulmonary) problems (especially infection) following: A long period of time when you are unable to move or be active. BEFORE THE PROCEDURE  If the spirometer includes an indicator to show your best effort, your nurse or respiratory therapist will set it to a desired goal. If possible, sit up straight or lean slightly forward. Try not to slouch. Hold the incentive spirometer in an upright position. INSTRUCTIONS FOR USE  Sit on the edge of your bed if possible, or sit up as far as you can in bed or on a chair. Hold the incentive spirometer in an upright position. Breathe out normally. Place the mouthpiece in your mouth and seal your lips tightly around it. Breathe in slowly and as deeply as possible, raising the piston or the ball toward the top of the column. Hold your breath for 3-5 seconds or for as long as possible. Allow the piston or ball to fall to the bottom of the column. Remove the mouthpiece from your mouth and breathe out normally. Rest for a few seconds and repeat Steps 1 through 7 at least 10 times every 1-2 hours when you are awake. Take your time and take a few normal breaths between deep breaths. The spirometer may include an indicator to show your best effort. Use the indicator as a goal to work toward during each repetition. After each set of 10 deep breaths, practice coughing  to be sure your lungs are clear. If you have an incision (the cut made at the time of surgery), support your incision when coughing by placing a pillow or rolled up towels firmly against it. Once you are able to get out of bed, walk around indoors and cough well. You may stop using the incentive spirometer when instructed by your caregiver.  RISKS AND COMPLICATIONS Take your time so you do not get dizzy or light-headed. If you are in pain, you may need to take or ask  for pain medication before doing incentive spirometry. It is harder to take a deep breath if you are having pain. AFTER USE Rest and breathe slowly and easily. It can be helpful to keep track of a log of your progress. Your caregiver can provide you with a simple table to help with this. If you are using the spirometer at home, follow these instructions: [place] IF:  You are having difficultly using the spirometer. You have trouble using the spirometer as often as instructed. Your pain medication is not giving enough relief while using the spirometer. You develop fever of 100.5 F (38.1 C) or higher. SEEK IMMEDIATE MEDICAL CARE IF:  You cough up bloody sputum that had not been present before. You develop fever of 102 F (38.9 C) or greater. You develop worsening pain at or near the incision site. MAKE SURE YOU:  Understand these instructions. Will watch your condition. Will get help right away if you are not doing well or get worse. Document Released: 12/08/2006 Document Revised: 10/20/2011 Document Reviewed: 02/08/2007 [facility] Patient Information 2014 [place], [place].   ________________________________________________________________________

## 2022-11-27 ENCOUNTER — Encounter (HOSPITAL_COMMUNITY): Payer: Self-pay

## 2022-11-27 ENCOUNTER — Other Ambulatory Visit: Payer: Self-pay

## 2022-11-27 ENCOUNTER — Encounter (HOSPITAL_COMMUNITY)
Admission: RE | Admit: 2022-11-27 | Discharge: 2022-11-27 | Disposition: A | Discharge status: Home / Self Care | Payer: Medicare PPO | Source: Ambulatory Visit | Attending: Orthopedic Surgery | Admitting: Orthopedic Surgery

## 2022-11-27 ENCOUNTER — Telehealth: Payer: Self-pay | Admitting: *Deleted

## 2022-11-27 VITALS — BP 150/72 | HR 76 | Temp 98.2°F | Ht 59.0 in | Wt 107.0 lb

## 2022-11-27 DIAGNOSIS — I4510 Unspecified right bundle-branch block: Secondary | ICD-10-CM | POA: Diagnosis not present

## 2022-11-27 DIAGNOSIS — Z87898 Personal history of other specified conditions: Secondary | ICD-10-CM | POA: Insufficient documentation

## 2022-11-27 DIAGNOSIS — K219 Gastro-esophageal reflux disease without esophagitis: Secondary | ICD-10-CM | POA: Diagnosis not present

## 2022-11-27 DIAGNOSIS — Z01818 Encounter for other preprocedural examination: Secondary | ICD-10-CM | POA: Diagnosis present

## 2022-11-27 DIAGNOSIS — Z86718 Personal history of other venous thrombosis and embolism: Secondary | ICD-10-CM | POA: Diagnosis not present

## 2022-11-27 DIAGNOSIS — M19012 Primary osteoarthritis, left shoulder: Secondary | ICD-10-CM | POA: Insufficient documentation

## 2022-11-27 DIAGNOSIS — I10 Essential (primary) hypertension: Secondary | ICD-10-CM | POA: Insufficient documentation

## 2022-11-27 HISTORY — DX: Anxiety disorder, unspecified: F41.9

## 2022-11-27 HISTORY — DX: Malignant (primary) neoplasm, unspecified: C80.1

## 2022-11-27 LAB — SURGICAL PCR SCREEN
MRSA, PCR: NEGATIVE
Staphylococcus aureus: NEGATIVE

## 2022-11-27 NOTE — Progress Notes (Addendum)
For Short Stay: COVID SWAB appointment date:  Bowel Prep reminder:   For Anesthesia: PCP - Dr. Geoffry Paradise : Clearance: 09/29/22: Chart Cardiologist - Dr. Kristeen Miss. LOV: 09/20/19. Pulmunologist: Dr. Lavinia Sharps. LOV: 04/10/22  Chest x-ray -  EKG - 11/27/22 Stress Test -  ECHO -  Cardiac Cath -  Pacemaker/ICD device last checked: Pacemaker orders received: Device Rep notified:  Spinal Cord Stimulator: N/A  Sleep Study - N/A CPAP -   Fasting Blood Sugar - N/A Checks Blood Sugar ___0__ times a day Date and result of last Hgb A1c-  Last dose of GLP1 agonist- N/a GLP1 instructions:   Last dose of SGLT-2 inhibitors-  SGLT-2 instructions:   Blood Thinner Instructions: N/A Aspirin Instructions: Last Dose:  Activity level: Can go up a flight of stairs and activities of daily living without stopping and without chest pain and/or shortness of breath   Able to exercise without chest pain and/or shortness of breath  Anesthesia review: Hx: HTN,Rt.BBB,DVT,lung sarcoidosis,Syncope.  Patient denies shortness of breath, fever, cough and chest pain at PAT appointment   Patient verbalized understanding of instructions that were given to them at the PAT appointment. Patient was also instructed that they will need to review over the PAT instructions again at home before surgery.

## 2022-11-27 NOTE — Telephone Encounter (Signed)
Received call from patient asking to cancel her appts on 12/19/22  She has decided that since she is having shoulder surgery soon, she would rather skip May's treatment. This ok with Dr. Leonides Schanz

## 2022-11-28 NOTE — Progress Notes (Signed)
Anesthesia Chart Review   Case: [id number] Date/Time: 12/05/22 0700   Procedures:      REVERSE SHOULDER ARTHROPLASTY (Left: Shoulder) - CHOICE WITH INTERSCALENE BLOCK     OPEN DISTAL CLAVICLE RESECTION (Left) - CHOICE WITH INTERSCALENE BLOCK   Anesthesia type: Choice   Pre-op diagnosis: left shoulder acromioclavicular joint osteoarthritis   Location: [facility] ROOM 06 / [facility]   Surgeons: Beverely Low, MD       DISCUSSION:89 y.o. never smoker with h/o PONV, HTN, GERD, DVT, sarcoidosis, left shoulder ac joint OA scheduled for above procedure 12/05/2022 with Dr. Beverely Low.   Pt with h/o sarcoidosis with associated mild right middle lobe collapse, chronic cough, followed by pulmonology.  Last seen 04/10/2022, stable at this visit.    Pt last seen by PCP 09/03/2022. Stable at this visit.  VS: BP (!) 150/72   Pulse 76   Temp 36.8 C (Oral)   Ht  (1.499 m)   Wt 48.5 kg   SpO2 100%   BMI 21.61 kg/m   PROVIDERS: Geoffry Paradise, MD is PCP    LABS: Labs reviewed: Acceptable for surgery. (all labs ordered are listed, but only abnormal results are displayed)  Labs Reviewed  SURGICAL PCR SCREEN     IMAGES:   EKG:   CV:  Past Medical History:  Diagnosis Date   Alopecia 10/28/2016   Anginal pain    Anxiety    Arthritis    Cancer    Cervical spondylosis    C3-4 AND C4-5   Chest pain 07/28/2008   H/O, normal stress nuclear EF 78%   Chronic cough 07/12/2014   Collapsed lung 07/2010   being treated at [facility] - in left lung   Complication of anesthesia    Cough 07/30/2010   Qualifier: Diagnosis of  By: Marchelle Gearing MD, Murali     Cystocele 05/23/2015   Cystocele, midline 05/02/2013   DEEP VEIN THROMBOSIS/PHLEBITIS 07/30/2010   Qualifier: History of  By: Yancey Flemings CMA, Jennifer     Dislocation closed, shoulder 02/2013   DVT (deep venous thrombosis) 2006   Encounter for therapeutic drug monitoring 10/03/2014   GERD (gastroesophageal reflux disease)    occ   Headache     hx   High cholesterol    History of recurrent UTIs 09/24/2011   HYPERLIPIDEMIA 07/30/2010   Qualifier: Diagnosis of  By: Yancey Flemings CMA, Jennifer     Hypertension    HYPERTENSION 07/30/2010   Qualifier: Diagnosis of  By: Yancey Flemings CMA, Jennifer     Hypothyroidism    Nasal itching 01/30/2014   Osteopenia 05/23/2015   Peripheral vascular disease 2006   dvt's and 50 yrs ago   Pleuritic pain 12/17/2015   PONV (postoperative nausea and vomiting)    Primary localized osteoarthritis of right hip 08/26/2016   Primary osteoarthritis of right hip 08/26/2016   Pulmonary collapse 07/30/2010   Qualifier: Diagnosis of  By: Marchelle Gearing MD, Murali     PULMONARY NODULE 07/30/2010   Qualifier: Diagnosis of  By: Marchelle Gearing MD, Murali     Rash and nonspecific skin eruption 01/02/2015   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   Sarcoidosis    Sarcoidosis of lung    Vaginal atrophy 09/24/2011   Visual field defect nasal step 01/02/2015    Past Surgical History:  Procedure Laterality Date   BACK SURGERY  2022   EYE SURGERY     FOOT SURGERY Left 06/2005   bunion hammer toe   HEMORRHOID SURGERY  12/1972   INTRAOCULAR  LENS INSERTION     right eye 10 /16 and left 06/16/2005   JOINT REPLACEMENT     KYPHOPLASTY N/A 06/27/2021   Procedure: LUMBAR 3 AND LUMBAR  5 KYPHOPLASTY;  Surgeon: Estill Bamberg, MD;  Location: [facility];  Service: Orthopedics;  Laterality: N/A;   KYPHOPLASTY N/A 09/04/2021   Procedure: THORACIC SEVEN, THORACIC EIGHT, THORACIC NINE, THORACIC TEN KYPHOPLASTY;  Surgeon: Estill Bamberg, MD;  Location: [facility];  Service: Orthopedics;  Laterality: N/A;   SKIN BIOPSY  01/2007   basal cell carcinoma removed from right lower leg    TOTAL HIP ARTHROPLASTY  06/2007   left   TOTAL HIP ARTHROPLASTY Right 08/26/2016   Procedure: TOTAL HIP ARTHROPLASTY ANTERIOR APPROACH;  Surgeon: Marcene Corning, MD;  Location: [facility];  Service: Orthopedics;  Laterality: Right;   TOTAL KNEE ARTHROPLASTY Right 10/2007    right    MEDICATIONS:  acyclovir (ZOVIRAX) 400 MG tablet   amLODipine (NORVASC) 10 MG tablet   b complex vitamins capsule   furosemide (LASIX) 20 MG tablet   gemfibrozil (LOPID) 600 MG tablet   HYDROcodone-acetaminophen (NORCO/VICODIN) 5-325 MG tablet   LORazepam (ATIVAN) 1 MG tablet   losartan (COZAAR) 50 MG tablet   Multiple Vitamin (MULTIVITAMIN) capsule   naproxen sodium (ALEVE) 220 MG tablet   pantoprazole (PROTONIX) 20 MG tablet   polyethylene glycol powder (GLYCOLAX/MIRALAX) 17 GM/SCOOP powder   prednisoLONE acetate (PRED FORTE) 1 % ophthalmic suspension   pregabalin (LYRICA) 25 MG capsule   trimethoprim (TRIMPEX) 100 MG tablet   No current facility-administered medications for this encounter.     Jodell Cipro Ward, PA-C [facility]Surgical Testing [phone]

## 2022-12-04 ENCOUNTER — Encounter (HOSPITAL_COMMUNITY): Payer: Self-pay | Admitting: Orthopedic Surgery

## 2022-12-04 NOTE — Anesthesia Preprocedure Evaluation (Addendum)
Anesthesia Evaluation  Patient identified by MRN, date of birth, ID band Patient awake    Reviewed: Allergy & Precautions, NPO status , Patient's Chart, lab work & pertinent test results  History of Anesthesia Complications (+) PONV and history of anesthetic complications  Airway Mallampati: II  TM Distance: >3 FB     Dental no notable dental hx. (+) Caps, Dental Advisory Given   Pulmonary  Sarcoidosis Hx/o pulmonary nodules   breath sounds clear to auscultation + decreased breath sounds      Cardiovascular hypertension, Pt. on medications + angina  + Peripheral Vascular Disease  Normal cardiovascular exam(-) dysrhythmias  Rhythm:Regular Rate:Normal  EKG 11/27/22 NSR, RBBB pattern, baseline artifact, non specific ST-T wave changes inferolateral leads     Neuro/Psych  Headaches PSYCHIATRIC DISORDERS Anxiety        GI/Hepatic ,GERD  Medicated,,  Endo/Other  Hypothyroidism  hyperlipidemia  Renal/GU   negative genitourinary   Musculoskeletal  (+) Arthritis , Osteoarthritis,  Right shoulder AC joint arthritis Cervical arthritis Hx/o kyphoplasty   Abdominal   Peds  Hematology   Anesthesia Other Findings   Reproductive/Obstetrics                              Anesthesia Physical Anesthesia Plan  ASA: 3  Anesthesia Plan: General   Post-op Pain Management: Regional block* and Minimal or no pain anticipated   Induction: Intravenous  PONV Risk Score and Plan: 4 or greater and TIVA, Propofol infusion, Ondansetron and Treatment may vary due to age or medical condition  Airway Management Planned: Oral ETT  Additional Equipment: None  Intra-op Plan:   Post-operative Plan: Extubation in OR  Informed Consent: I have reviewed the patients History and Physical, chart, labs and discussed the procedure including the risks, benefits and alternatives for the proposed anesthesia with the patient  or authorized representative who has indicated his/her understanding and acceptance.     Dental advisory given  Plan Discussed with: Anesthesiologist and CRNA  Anesthesia Plan Comments:          Anesthesia Quick Evaluation

## 2022-12-05 ENCOUNTER — Encounter (HOSPITAL_COMMUNITY): Payer: Self-pay | Admitting: Orthopedic Surgery

## 2022-12-05 ENCOUNTER — Ambulatory Visit (HOSPITAL_COMMUNITY): Payer: Medicare PPO | Admitting: Certified Registered"

## 2022-12-05 ENCOUNTER — Ambulatory Visit (HOSPITAL_COMMUNITY): Payer: Medicare PPO

## 2022-12-05 ENCOUNTER — Ambulatory Visit (HOSPITAL_COMMUNITY): Payer: Medicare PPO | Admitting: Physician Assistant

## 2022-12-05 ENCOUNTER — Encounter (HOSPITAL_COMMUNITY)
Admission: RE | Disposition: A | Discharge status: Home / Self Care | Payer: Self-pay | Source: Ambulatory Visit | Attending: Orthopedic Surgery

## 2022-12-05 ENCOUNTER — Other Ambulatory Visit: Payer: Self-pay

## 2022-12-05 ENCOUNTER — Observation Stay (HOSPITAL_COMMUNITY)
Admission: RE | Admit: 2022-12-05 | Discharge: 2022-12-06 | Disposition: A | Discharge status: Home / Self Care | Payer: Medicare PPO | Source: Ambulatory Visit | Attending: Orthopedic Surgery | Admitting: Orthopedic Surgery

## 2022-12-05 DIAGNOSIS — I739 Peripheral vascular disease, unspecified: Secondary | ICD-10-CM | POA: Diagnosis not present

## 2022-12-05 DIAGNOSIS — M12812 Other specific arthropathies, not elsewhere classified, left shoulder: Secondary | ICD-10-CM | POA: Diagnosis not present

## 2022-12-05 DIAGNOSIS — Z85828 Personal history of other malignant neoplasm of skin: Secondary | ICD-10-CM | POA: Insufficient documentation

## 2022-12-05 DIAGNOSIS — M75102 Unspecified rotator cuff tear or rupture of left shoulder, not specified as traumatic: Secondary | ICD-10-CM | POA: Diagnosis not present

## 2022-12-05 DIAGNOSIS — Z96612 Presence of left artificial shoulder joint: Secondary | ICD-10-CM

## 2022-12-05 DIAGNOSIS — Z96651 Presence of right artificial knee joint: Secondary | ICD-10-CM | POA: Diagnosis not present

## 2022-12-05 DIAGNOSIS — Z96643 Presence of artificial hip joint, bilateral: Secondary | ICD-10-CM | POA: Insufficient documentation

## 2022-12-05 DIAGNOSIS — Z79899 Other long term (current) drug therapy: Secondary | ICD-10-CM | POA: Insufficient documentation

## 2022-12-05 DIAGNOSIS — I10 Essential (primary) hypertension: Secondary | ICD-10-CM | POA: Diagnosis not present

## 2022-12-05 DIAGNOSIS — E039 Hypothyroidism, unspecified: Secondary | ICD-10-CM | POA: Insufficient documentation

## 2022-12-05 DIAGNOSIS — C9000 Multiple myeloma not having achieved remission: Secondary | ICD-10-CM

## 2022-12-05 DIAGNOSIS — Z86718 Personal history of other venous thrombosis and embolism: Secondary | ICD-10-CM | POA: Insufficient documentation

## 2022-12-05 HISTORY — PX: REVERSE SHOULDER ARTHROPLASTY: SHX5054

## 2022-12-05 SURGERY — ARTHROPLASTY, SHOULDER, TOTAL, REVERSE
Anesthesia: General | Site: Shoulder | Laterality: Left

## 2022-12-05 MED ORDER — DOCUSATE SODIUM 100 MG PO CAPS
100.0000 mg | ORAL_CAPSULE | Freq: Two times a day (BID) | ORAL | Status: DC
  Administered 2022-12-05: 100 mg via ORAL
  Filled 2022-12-05 (×2): qty 1

## 2022-12-05 MED ORDER — ROCURONIUM BROMIDE 10 MG/ML (PF) SYRINGE
PREFILLED_SYRINGE | INTRAVENOUS | Status: DC | PRN
  Administered 2022-12-05: 60 mg via INTRAVENOUS

## 2022-12-05 MED ORDER — PHENYLEPHRINE HCL-NACL 20-0.9 MG/250ML-% IV SOLN
INTRAVENOUS | Status: DC | PRN
  Administered 2022-12-05: 50 ug/min via INTRAVENOUS

## 2022-12-05 MED ORDER — DEXAMETHASONE SODIUM PHOSPHATE 10 MG/ML IJ SOLN
INTRAMUSCULAR | Status: DC | PRN
  Administered 2022-12-05: 4 mg via INTRAVENOUS

## 2022-12-05 MED ORDER — FENTANYL CITRATE PF 50 MCG/ML IJ SOSY: 25.0000 ug | PREFILLED_SYRINGE | INTRAMUSCULAR | Status: DC | PRN

## 2022-12-05 MED ORDER — PROPOFOL 10 MG/ML IV BOLUS
INTRAVENOUS | Status: DC | PRN
  Administered 2022-12-05: 110 mg via INTRAVENOUS

## 2022-12-05 MED ORDER — PROPOFOL 10 MG/ML IV BOLUS
INTRAVENOUS | Status: AC
  Filled 2022-12-05: qty 20

## 2022-12-05 MED ORDER — PHENOL 1.4 % MT LIQD: 1.0000 | OROMUCOSAL | Status: DC | PRN

## 2022-12-05 MED ORDER — ONDANSETRON HCL 4 MG/2ML IJ SOLN
INTRAMUSCULAR | Status: DC | PRN
  Administered 2022-12-05: 4 mg via INTRAVENOUS

## 2022-12-05 MED ORDER — ONDANSETRON HCL 4 MG/2ML IJ SOLN: 4.0000 mg | Freq: Four times a day (QID) | INTRAMUSCULAR | Status: DC | PRN

## 2022-12-05 MED ORDER — EPHEDRINE SULFATE-NACL 50-0.9 MG/10ML-% IV SOSY
PREFILLED_SYRINGE | INTRAVENOUS | Status: DC | PRN
  Administered 2022-12-05 (×2): 5 mg via INTRAVENOUS

## 2022-12-05 MED ORDER — AMLODIPINE BESYLATE 10 MG PO TABS
10.0000 mg | ORAL_TABLET | Freq: Every day | ORAL | Status: DC
  Administered 2022-12-06: 10 mg via ORAL
  Filled 2022-12-05: qty 1

## 2022-12-05 MED ORDER — 0.9 % SODIUM CHLORIDE (POUR BTL) OPTIME
TOPICAL | Status: DC | PRN
  Administered 2022-12-05: 1000 mL

## 2022-12-05 MED ORDER — MENTHOL 3 MG MT LOZG: 1.0000 | LOZENGE | OROMUCOSAL | Status: DC | PRN

## 2022-12-05 MED ORDER — OXYCODONE HCL 5 MG PO TABS: 5.0000 mg | ORAL_TABLET | Freq: Once | ORAL | Status: DC | PRN

## 2022-12-05 MED ORDER — LACTATED RINGERS IV SOLN: INTRAVENOUS | Status: DC

## 2022-12-05 MED ORDER — CHLORHEXIDINE GLUCONATE 0.12 % MT SOLN
15.0000 mL | Freq: Once | OROMUCOSAL | Status: AC
  Administered 2022-12-05: 15 mL via OROMUCOSAL

## 2022-12-05 MED ORDER — ORAL CARE MOUTH RINSE: 15.0000 mL | Freq: Once | OROMUCOSAL | Status: AC

## 2022-12-05 MED ORDER — FENTANYL CITRATE (PF) 100 MCG/2ML IJ SOLN
INTRAMUSCULAR | Status: AC
  Filled 2022-12-05: qty 2

## 2022-12-05 MED ORDER — METOCLOPRAMIDE HCL 5 MG/ML IJ SOLN: 5.0000 mg | Freq: Three times a day (TID) | INTRAMUSCULAR | Status: DC | PRN

## 2022-12-05 MED ORDER — POLYETHYLENE GLYCOL 3350 17 G PO PACK: 17.0000 g | PACK | Freq: Every day | ORAL | Status: DC

## 2022-12-05 MED ORDER — ONDANSETRON HCL 4 MG/2ML IJ SOLN
INTRAMUSCULAR | Status: AC
  Filled 2022-12-05: qty 2

## 2022-12-05 MED ORDER — HYDROCODONE-ACETAMINOPHEN 5-325 MG PO TABS
0.5000 | ORAL_TABLET | Freq: Four times a day (QID) | ORAL | 0 refills | Status: DC | PRN
Start: 2022-12-05 — End: 2022-12-31

## 2022-12-05 MED ORDER — CEFAZOLIN SODIUM-DEXTROSE 2-4 GM/100ML-% IV SOLN
2.0000 g | INTRAVENOUS | Status: AC
  Administered 2022-12-05: 2 g via INTRAVENOUS
  Filled 2022-12-05: qty 100

## 2022-12-05 MED ORDER — BUPIVACAINE-EPINEPHRINE (PF) 0.25% -1:200000 IJ SOLN
INTRAMUSCULAR | Status: DC | PRN
  Administered 2022-12-05: 30 mL

## 2022-12-05 MED ORDER — PREGABALIN 25 MG PO CAPS
25.0000 mg | ORAL_CAPSULE | Freq: Two times a day (BID) | ORAL | Status: DC
  Administered 2022-12-05 – 2022-12-06 (×2): 25 mg via ORAL
  Filled 2022-12-05 (×3): qty 1

## 2022-12-05 MED ORDER — B COMPLEX-C PO TABS
1.0000 | ORAL_TABLET | Freq: Every day | ORAL | Status: DC
  Administered 2022-12-05 – 2022-12-06 (×2): 1 via ORAL
  Filled 2022-12-05 (×2): qty 1

## 2022-12-05 MED ORDER — POLYETHYLENE GLYCOL 3350 17 GM/SCOOP PO POWD
17.0000 g | Freq: Every day | ORAL | Status: DC
  Filled 2022-12-05: qty 255

## 2022-12-05 MED ORDER — BUPIVACAINE HCL (PF) 0.25 % IJ SOLN
INTRAMUSCULAR | Status: AC
  Filled 2022-12-05: qty 30

## 2022-12-05 MED ORDER — TRIMETHOPRIM 100 MG PO TABS
100.0000 mg | ORAL_TABLET | Freq: Every day | ORAL | Status: DC
  Administered 2022-12-05 – 2022-12-06 (×2): 100 mg via ORAL
  Filled 2022-12-05 (×2): qty 1

## 2022-12-05 MED ORDER — ROCURONIUM BROMIDE 10 MG/ML (PF) SYRINGE
PREFILLED_SYRINGE | INTRAVENOUS | Status: AC
  Filled 2022-12-05: qty 10

## 2022-12-05 MED ORDER — ADULT MULTIVITAMIN W/MINERALS CH
1.0000 | ORAL_TABLET | Freq: Every day | ORAL | Status: DC
  Administered 2022-12-05 – 2022-12-06 (×2): 1 via ORAL
  Filled 2022-12-05 (×2): qty 1

## 2022-12-05 MED ORDER — PROPOFOL 500 MG/50ML IV EMUL
INTRAVENOUS | Status: DC | PRN
  Administered 2022-12-05: 125 ug/kg/min via INTRAVENOUS

## 2022-12-05 MED ORDER — GEMFIBROZIL 600 MG PO TABS
600.0000 mg | ORAL_TABLET | Freq: Two times a day (BID) | ORAL | Status: DC
  Administered 2022-12-05 – 2022-12-06 (×2): 600 mg via ORAL
  Filled 2022-12-05 (×3): qty 1

## 2022-12-05 MED ORDER — PREDNISOLONE ACETATE 1 % OP SUSP
1.0000 [drp] | Freq: Four times a day (QID) | OPHTHALMIC | Status: DC
  Administered 2022-12-05 – 2022-12-06 (×3): 1 [drp] via OPHTHALMIC
  Filled 2022-12-05: qty 5

## 2022-12-05 MED ORDER — ONDANSETRON HCL 4 MG PO TABS: 4.0000 mg | ORAL_TABLET | Freq: Four times a day (QID) | ORAL | Status: DC | PRN

## 2022-12-05 MED ORDER — LORAZEPAM 0.5 MG PO TABS: 0.5000 mg | ORAL_TABLET | Freq: Every evening | ORAL | Status: DC | PRN

## 2022-12-05 MED ORDER — STERILE WATER FOR IRRIGATION IR SOLN
Status: DC | PRN
  Administered 2022-12-05: 2000 mL

## 2022-12-05 MED ORDER — VANCOMYCIN HCL 1000 MG IV SOLR
INTRAVENOUS | Status: AC
  Filled 2022-12-05: qty 20

## 2022-12-05 MED ORDER — EPINEPHRINE PF 1 MG/ML IJ SOLN
INTRAMUSCULAR | Status: AC
  Filled 2022-12-05: qty 1

## 2022-12-05 MED ORDER — POLYETHYLENE GLYCOL 3350 17 G PO PACK: 17.0000 g | PACK | Freq: Every day | ORAL | Status: DC | PRN

## 2022-12-05 MED ORDER — TRANEXAMIC ACID-NACL 1000-0.7 MG/100ML-% IV SOLN
1000.0000 mg | INTRAVENOUS | Status: AC
  Administered 2022-12-05: 1000 mg via INTRAVENOUS
  Filled 2022-12-05: qty 100

## 2022-12-05 MED ORDER — ACYCLOVIR 400 MG PO TABS
400.0000 mg | ORAL_TABLET | Freq: Two times a day (BID) | ORAL | Status: DC
  Administered 2022-12-05 – 2022-12-06 (×2): 400 mg via ORAL
  Filled 2022-12-05 (×2): qty 1

## 2022-12-05 MED ORDER — SUGAMMADEX SODIUM 200 MG/2ML IV SOLN
INTRAVENOUS | Status: DC | PRN
  Administered 2022-12-05: 100 mg via INTRAVENOUS

## 2022-12-05 MED ORDER — DEXAMETHASONE SODIUM PHOSPHATE 10 MG/ML IJ SOLN
INTRAMUSCULAR | Status: AC
  Filled 2022-12-05: qty 1

## 2022-12-05 MED ORDER — LOSARTAN POTASSIUM 50 MG PO TABS
50.0000 mg | ORAL_TABLET | Freq: Every day | ORAL | Status: DC
  Administered 2022-12-05 – 2022-12-06 (×2): 50 mg via ORAL
  Filled 2022-12-05 (×2): qty 1

## 2022-12-05 MED ORDER — TRANEXAMIC ACID-NACL 1000-0.7 MG/100ML-% IV SOLN
1000.0000 mg | Freq: Once | INTRAVENOUS | Status: AC
  Administered 2022-12-05: 1000 mg via INTRAVENOUS
  Filled 2022-12-05: qty 100

## 2022-12-05 MED ORDER — NAPROXEN SODIUM 220 MG PO TABS: 440.0000 mg | ORAL_TABLET | Freq: Every day | ORAL | Status: DC | PRN

## 2022-12-05 MED ORDER — FENTANYL CITRATE (PF) 100 MCG/2ML IJ SOLN
INTRAMUSCULAR | Status: DC | PRN
  Administered 2022-12-05: 50 ug via INTRAVENOUS

## 2022-12-05 MED ORDER — SODIUM CHLORIDE 0.9 % IV SOLN: INTRAVENOUS | Status: DC

## 2022-12-05 MED ORDER — PANTOPRAZOLE SODIUM 40 MG PO TBEC
40.0000 mg | DELAYED_RELEASE_TABLET | Freq: Every day | ORAL | Status: DC
  Administered 2022-12-05: 40 mg via ORAL
  Filled 2022-12-05: qty 1

## 2022-12-05 MED ORDER — HYDROCODONE-ACETAMINOPHEN 5-325 MG PO TABS
0.5000 | ORAL_TABLET | Freq: Four times a day (QID) | ORAL | Status: DC | PRN
  Administered 2022-12-06: 0.5 via ORAL
  Filled 2022-12-05: qty 1

## 2022-12-05 MED ORDER — FUROSEMIDE 20 MG PO TABS: 20.0000 mg | ORAL_TABLET | Freq: Every day | ORAL | Status: DC | PRN

## 2022-12-05 MED ORDER — LIDOCAINE HCL (PF) 2 % IJ SOLN
INTRAMUSCULAR | Status: AC
  Filled 2022-12-05: qty 5

## 2022-12-05 MED ORDER — CEFAZOLIN SODIUM-DEXTROSE 1-4 GM/50ML-% IV SOLN
1.0000 g | Freq: Two times a day (BID) | INTRAVENOUS | Status: AC
  Administered 2022-12-05: 1 g via INTRAVENOUS
  Filled 2022-12-05: qty 50

## 2022-12-05 MED ORDER — OXYCODONE HCL 5 MG/5ML PO SOLN: 5.0000 mg | Freq: Once | ORAL | Status: DC | PRN

## 2022-12-05 MED ORDER — METOCLOPRAMIDE HCL 5 MG PO TABS: 5.0000 mg | ORAL_TABLET | Freq: Three times a day (TID) | ORAL | Status: DC | PRN

## 2022-12-05 MED ORDER — SODIUM CHLORIDE 0.9 % IR SOLN
Status: DC | PRN
  Administered 2022-12-05: 1000 mL

## 2022-12-05 SURGICAL SUPPLY — 70 items
AID PSTN UNV HD RSTRNT DISP (MISCELLANEOUS) ×2
BAG COUNTER SPONGE SURGICOUNT (BAG) IMPLANT
BAG SPEC THK2 15X12 ZIP CLS (MISCELLANEOUS)
BAG SPNG CNTER NS LX DISP (BAG)
BAG ZIPLOCK 12X15 (MISCELLANEOUS) IMPLANT
BIT DRILL 1.6MX128 (BIT) IMPLANT
BIT DRILL 170X2.5X (BIT) ×1 IMPLANT
BIT DRL 170X2.5X (BIT) ×2
BLADE SAG 18X100X1.27 (BLADE) ×2 IMPLANT
CLSR STERI-STRIP ANTIMIC 1/2X4 (GAUZE/BANDAGES/DRESSINGS) ×1 IMPLANT
COVER BACK TABLE 60X90IN (DRAPES) ×2 IMPLANT
COVER SURGICAL LIGHT HANDLE (MISCELLANEOUS) ×2 IMPLANT
DRAPE INCISE IOBAN 66X45 STRL (DRAPES) ×2 IMPLANT
DRAPE ORTHO SPLIT 77X108 STRL (DRAPES) ×4
DRAPE SHEET LG 3/4 BI-LAMINATE (DRAPES) ×2 IMPLANT
DRAPE SURG ORHT 6 SPLT 77X108 (DRAPES) ×4 IMPLANT
DRAPE TOP 10253 STERILE (DRAPES) ×2 IMPLANT
DRAPE U-SHAPE 47X51 STRL (DRAPES) ×2 IMPLANT
DRILL 2.5 (BIT) ×2
DRSG ADAPTIC 3X8 NADH LF (GAUZE/BANDAGES/DRESSINGS) ×2 IMPLANT
DRSG EMULSION OIL 3X16 NADH (GAUZE/BANDAGES/DRESSINGS) ×1 IMPLANT
DURAPREP 26ML APPLICATOR (WOUND CARE) ×2 IMPLANT
ELECT BLADE TIP CTD 4 INCH (ELECTRODE) ×2 IMPLANT
ELECT NDL TIP 2.8 STRL (NEEDLE) ×1 IMPLANT
ELECT NEEDLE TIP 2.8 STRL (NEEDLE) ×2 IMPLANT
ELECT REM PT RETURN 15FT ADLT (MISCELLANEOUS) ×2 IMPLANT
EPI LT SZ 1 (Orthopedic Implant) ×2 IMPLANT
EPIPHYSIS LT SZ 1 (Orthopedic Implant) ×1 IMPLANT
FACESHIELD WRAPAROUND (MASK) ×2 IMPLANT
FACESHIELD WRAPAROUND OR TEAM (MASK) ×1 IMPLANT
GAUZE PAD ABD 8X10 STRL (GAUZE/BANDAGES/DRESSINGS) ×2 IMPLANT
GAUZE SPONGE 4X4 12PLY STRL (GAUZE/BANDAGES/DRESSINGS) ×2 IMPLANT
GLENOSPHERE DELTA XTEND LAT 38 (Miscellaneous) ×1 IMPLANT
GLOVE BIOGEL PI IND STRL 7.5 (GLOVE) ×2 IMPLANT
GLOVE BIOGEL PI IND STRL 8.5 (GLOVE) ×2 IMPLANT
GLOVE ORTHO TXT STRL SZ7.5 (GLOVE) ×2 IMPLANT
GLOVE SURG ORTHO 8.5 STRL (GLOVE) ×2 IMPLANT
GOWN STRL REUS W/ TWL XL LVL3 (GOWN DISPOSABLE) ×4 IMPLANT
GOWN STRL REUS W/TWL XL LVL3 (GOWN DISPOSABLE) ×4
KIT BASIN OR (CUSTOM PROCEDURE TRAY) ×2 IMPLANT
KIT TURNOVER KIT A (KITS) IMPLANT
MANIFOLD NEPTUNE II (INSTRUMENTS) ×2 IMPLANT
METAGLENE DELTA EXTEND (Trauma) ×1 IMPLANT
METAGLENE DXTEND (Trauma) ×2 IMPLANT
NDL MAYO CATGUT SZ4 TPR NDL (NEEDLE) IMPLANT
NEEDLE MAYO CATGUT SZ4 (NEEDLE) IMPLANT
NS IRRIG 1000ML POUR BTL (IV SOLUTION) ×2 IMPLANT
PACK SHOULDER (CUSTOM PROCEDURE TRAY) ×2 IMPLANT
PIN GUIDE 1.2 (PIN) ×1 IMPLANT
PIN GUIDE GLENOPHERE 1.5MX300M (PIN) ×1 IMPLANT
PIN METAGLENE 2.5 (PIN) ×1 IMPLANT
RESTRAINT HEAD UNIVERSAL NS (MISCELLANEOUS) ×2 IMPLANT
SCREW 4.5X24MM (Screw) ×2 IMPLANT
SCREW BN 24X4.5XLCK STRL (Screw) ×1 IMPLANT
SCREW LOCK 42 (Screw) ×1 IMPLANT
SLING ARM FOAM STRAP LRG (SOFTGOODS) IMPLANT
SLING ARM FOAM STRAP MED (SOFTGOODS) ×1 IMPLANT
SPACER 38 PLUS 3 (Spacer) ×1 IMPLANT
SPIKE FLUID TRANSFER (MISCELLANEOUS) ×2 IMPLANT
SPONGE T-LAP 4X18 ~~LOC~~+RFID (SPONGE) IMPLANT
STEM DELTA DIA 10 HA (Stem) ×1 IMPLANT
STRIP CLOSURE SKIN 1/2X4 (GAUZE/BANDAGES/DRESSINGS) ×2 IMPLANT
SUT FIBERWIRE #2 38 T-5 BLUE (SUTURE) ×2
SUT MNCRL AB 4-0 PS2 18 (SUTURE) ×2 IMPLANT
SUT VIC AB 0 CT1 36 (SUTURE) ×2 IMPLANT
SUT VIC AB 0 CT2 27 (SUTURE) ×2 IMPLANT
SUT VIC AB 2-0 CT1 27 (SUTURE) ×2
SUT VIC AB 2-0 CT1 TAPERPNT 27 (SUTURE) ×2 IMPLANT
SUTURE FIBERWR #2 38 T-5 BLUE (SUTURE) ×2 IMPLANT
TOWEL OR 17X26 10 PK STRL BLUE (TOWEL DISPOSABLE) ×2 IMPLANT

## 2022-12-05 NOTE — Op Note (Signed)
[name], [name] Page MEDICAL RECORD NO: [id number] ACCOUNT NO: [id number] DATE OF BIRTH: 05-24-1931 FACILITY: [facility]ATION[id number] PHYSICIAN: [name] Balls. Ranell Patrick, MD  Operative Report   DATE OF PROCEDURE: 12/05/2022  PREOPERATIVE DIAGNOSIS:  Left shoulder rotator cuff tear arthropathy.  POSTOPERATIVE DIAGNOSIS:  Left shoulder rotator cuff tear arthropathy.  PROCEDURE PERFORMED:  Left reverse total shoulder arthroplasty using DePuy Delta Xtend prosthesis with no subscap repair.  ATTENDING SURGEON:  [name] Balls. Ranell Patrick, MD  ASSISTANT:  [name] Page, [name] Page, who was scrubbed during the entire procedure, and necessary for satisfactory completion of surgery.  ANESTHESIA:  General anesthesia plus interscalene block was used.  ESTIMATED BLOOD LOSS:  100 mL.  FLUID REPLACEMENT:  1200 mL crystalloid.  COUNTS:  Instrument counts correct.  COMPLICATIONS:  No complications.  ANTIBIOTICS:  Perioperative antibiotics were given.  INDICATIONS:  The patient is an 89 year old female who presents with a history of worsening left shoulder pain secondary to rotator cuff tear arthropathy.  She has failed conservative management, desires operative treatment to eliminate pain and restore  function.  Informed consent obtained.  DESCRIPTION OF PROCEDURE:  After an adequate level of anesthesia was achieved, the patient was positioned in modified beach chair position.  Left shoulder correctly identified and sterile prep and drape performed.  Timeout called, verifying correct  patient, correct site, we entered the patient's shoulder using standard deltopectoral approach, starting at the coracoid process extending down to the anterior humerus.  Dissection down through subcutaneous tissues using Bovie.  Cephalic vein was  identified and retracted laterally with the deltoid, pectoralis taken medially.  Conjoined tendon identified and retracted medially.  Deep retractors placed.  Biceps tenodesed in situ with 0  Vicryl figure-of-eight suture.  We released the subscap  remnant, which was not repairable, but tagged for protection of the axillary nerve.  We then released the inferior capsule extending the shoulder and delivering the humeral head out of the wound.  Humeral head was devoid of any rotator cuff.  There was  also no cartilage noted on the humeral head.  We entered the proximal humerus with a 6mm reamer, reamed up to a size 10.  We then placed our 10 mm T-handle guide and resected the head at 20 degrees of retroversion with the oscillating saw.  Next, we  removed osteophytes and then subluxed the humerus posteriorly.  We had good exposure of the glenoid face.  We removed the biceps stump, the labrum and went ahead and removed the capsule and the labrum and then at this point, we found the center point,  which was low on the glenoid face and drilled our guide pin.  We then reamed for the metaglene baseplate.  Next, we did our peripheral hand reaming and then drilled out our central peg hole.  Next, we impacted the HA coated press-fit baseplate into  position.  We then placed a 42 screw inferiorly with good purchase and a 24 screw superiorly with good purchase. With the baseplate secured, we selected a 38+0 standard glenosphere and attached that to the baseplate with a screwdriver.  I did a finger  sweep to make sure we had no soft tissue caught up between the baseplate and the glenosphere.  We then went to the humeral side and reamed for the one left metaphysis.  We then trialled with a 10 stem, the one left metaphysis set on the 0 setting and  trialled in 20 degrees of retroversion.  We used a 38+3 poly trial on the humeral  tray, reduced the shoulder, had nice soft tissue balancing and stability with no impingement.  We removed all trial components from the humeral side, irrigated thoroughly  and then used available bone graft from the humeral head with impaction grafting technique with the HA coated press  fit 10 stem and the one left metaphysis again set on the 0 setting and impacted in 20 degrees of retroversion.  With the stem secured, we  selected the real 38+3 poly impacted on the humeral tray and reduced the shoulder.  Nice little pop as it reduced.  Appropriate tensioning and stability.  We irrigated thoroughly.  We removed our traction suture and then went ahead and closed  deltopectoral interval with 0 Vicryl suture followed by 2-0 Vicryl for subcutaneous closure and 4-0 Monocryl for skin.  Steri-Strips applied followed by sterile dressing.  The patient tolerated surgery well.   PUS D: 12/05/2022 8:50:32 am T: 12/05/2022 3:36:00 pm  JOB: [id number]/ [id number]

## 2022-12-05 NOTE — Anesthesia Procedure Notes (Signed)
Procedure Name: Intubation Date/Time: 12/05/2022 7:33 AM  Performed by: Sindy Guadeloupe, CRNAPre-anesthesia Checklist: Patient identified, Emergency Drugs available, Suction available, Patient being monitored and Timeout performed Patient Re-evaluated:Patient Re-evaluated prior to induction Oxygen Delivery Method: Circle system utilized Preoxygenation: Pre-oxygenation with 100% oxygen Induction Type: IV induction Ventilation: Mask ventilation without difficulty Laryngoscope Size: Mac and 3 Grade View: Grade II Tube type: Oral Tube size: 6.5 mm Number of attempts: 1 Airway Equipment and Method: Stylet Placement Confirmation: ETT inserted through vocal cords under direct vision, positive ETCO2 and breath sounds checked- equal and bilateral Secured at: 21 cm Tube secured with: Tape Dental Injury: Teeth and Oropharynx as per pre-operative assessment

## 2022-12-05 NOTE — TOC CM/SW Note (Signed)
  Transition of Care (TOC) Screening Note   Patient Details  Name: [name] Page Date of Birth: [birth year]   Transition of Care [facility]) CM/SW Contact:    Amada Jupiter, LCSW Phone Number: 12/05/2022, 2:01 PM    Transition of Care Department [facility]) has reviewed patient and no TOC needs have been identified at this time. We will continue to monitor patient advancement through interdisciplinary progression rounds. If new patient transition needs arise, please place a TOC consult.  Pt reports she [place] [facility] where she resides with her spouse.  Therapy services are already set up to see her there.  No TOC needs.  Twila Rappa, LCSW

## 2022-12-05 NOTE — Plan of Care (Signed)
  Problem: Education: Goal: Knowledge of the prescribed therapeutic regimen will improve Outcome: Progressing   Problem: Activity: Goal: Ability to tolerate increased activity will improve Outcome: Progressing   Problem: Pain Management: Goal: Pain level will decrease with appropriate interventions Outcome: Progressing   Problem: Nutrition: Goal: Adequate nutrition will be maintained Outcome: Progressing   

## 2022-12-05 NOTE — Brief Op Note (Signed)
12/05/2022  8:44 AM  PATIENT:  [name] Page  89 y.o. female  PRE-OPERATIVE DIAGNOSIS:  left shoulder rotator cuff tear arthropathy  POST-OPERATIVE DIAGNOSIS:  left shoulder rotator cuff tear arthropathy  PROCEDURE:  Procedure(s) with comments: REVERSE SHOULDER ARTHROPLASTY (Left) - CHOICE WITH INTERSCALENE BLOCK DePuy Delta Xtend with NO subscap repair  SURGEON:  Surgeon(s) and Role:    Beverely Low, MD - Primary  PHYSICIAN ASSISTANT:   ASSISTANTS: Thea Gist, PA-C   ANESTHESIA:   regional and general  EBL:  100 mL   BLOOD ADMINISTERED:none  DRAINS: none   LOCAL MEDICATIONS USED:  MARCAINE     SPECIMEN:  No Specimen  DISPOSITION OF SPECIMEN:  N/A  COUNTS:  YES  TOURNIQUET:  none  DICTATION: .Other Dictation: Dictation Number [id number]  PLAN OF CARE: Admit for overnight observation  PATIENT DISPOSITION:  PACU - hemodynamically stable.   Delay start of Pharmacological VTE agent (>24hrs) due to surgical blood loss or risk of bleeding: not applicable

## 2022-12-05 NOTE — Anesthesia Procedure Notes (Signed)
Anesthesia Regional Block: Interscalene brachial plexus block   Pre-Anesthetic Checklist: , timeout performed,  Correct Patient, Correct Site, Correct Laterality,  Correct Procedure, Correct Position, site marked,  Risks and benefits discussed,  Surgical consent,  Pre-op evaluation,  At surgeon's request and post-op pain management  Laterality: Left  Prep: chloraprep       Needles:  Injection technique: Single-shot  Needle Type: Echogenic Stimulator Needle     Needle Length: 10cm  Needle Gauge: 21   Needle insertion depth: 5 cm   Additional Needles:   Procedures:,,,, ultrasound used (permanent image in chart),,   Motor weakness within 5 minutes.  Narrative:  Start time: 12/05/2022 7:15 AM End time: 12/05/2022 7:20 AM Injection made incrementally with aspirations every 5 mL.  Performed by: Personally  Anesthesiologist: Mal Amabile, MD  Additional Notes: Timeout performed. Patient sedated. Relevant anatomy ID'd using [place]. Incremental 2-43ml injection of LA with frequent aspiration. Patient tolerated procedure well.

## 2022-12-05 NOTE — Discharge Instructions (Signed)
Ice to the shoulder constantly.  Keep the incision covered and clean and dry for one week, then ok to get it wet in the shower. Leave the incision open to air after one week  Do exercise as instructed several times per day.  DO NOT reach behind your back or push up out of a chair with the operative arm.  Use a sling while you are up and around for comfort, may remove while seated.  Keep pillow propped behind the operative elbow.  Follow up with Dr Ranell Patrick in two weeks in the office, call [phone] for appt  Please call Dr Ranell Patrick (cell) at [phone] with any questions or concerns

## 2022-12-05 NOTE — Interval H&P Note (Signed)
History and Physical Interval Note:  12/05/2022 7:12 AM  [name] Page  has presented today for surgery, with the diagnosis of left shoulder acromioclavicular joint osteoarthritis.  The various methods of treatment have been discussed with the patient and family. After consideration of risks, benefits and other options for treatment, the patient has consented to  Procedure(s) with comments: REVERSE SHOULDER ARTHROPLASTY (Left) - CHOICE WITH INTERSCALENE BLOCK OPEN DISTAL CLAVICLE RESECTION (Left) - CHOICE WITH INTERSCALENE BLOCK as a surgical intervention.  The patient's history has been reviewed, patient examined, no change in status, stable for surgery.  I have reviewed the patient's chart and labs.  Questions were answered to the patient's satisfaction.     Verlee Rossetti

## 2022-12-05 NOTE — Evaluation (Signed)
Occupational Therapy Evaluation Patient Details Name: [name] Page MRN: [id number] DOB: 1930/12/07 Today's Date: 12/05/2022   History of Present Illness [name] Page  89 y.o. female presenting with  PRE-OPERATIVE DIAGNOSIS:  left shoulder rotator cuff tear arthropathy. Pt underwent: REVERSE SHOULDER ARTHROPLASTY (Left) - CHOICE WITH INTERSCALENE BLOCK  under Dr. Ranell Patrick.   Clinical Impression   Pt is a 89 year old female, s/p LT reverse total shoulder replacement without functional use of LT non-dominant upper extremity secondary to effects of surgery and interscalene block and shoulder precautions. Therapist provided education and instruction to patient in regards to exercises, precautions, positioning, donning upper extremity clothing and bathing while maintaining shoulder precautions, ice and edema management and donning/doffing sling. Patient verbalized understanding and demonstrated as needed. Patient will need assistance to donn shirt, underwear, pants, socks and shoes and provided with instruction on compensatory strategies to perform ADLs. Patient limited by decreased ROM in bilateral shoulders and immobilization of LT shoulder, so therefore will need some form of assistance at home. Pt reports that her spouse can provide any assistance needed and that he helps her at baseline with similar ADLs.  Pt is a high fall risk with posterior bias with transfers and did require Minimal RT HHA and gait belt  for safety. Pt declined acute PT Eval stating it is all setup for [employer] on Monday. Patient verbalized and/or demonstrated understanding to all instruction. Patient to follow up with MD for further therapy needs.         Recommendations for follow up therapy are one component of a multi-disciplinary discharge planning process, led by the attending physician.  Recommendations may be updated based on patient status, additional functional criteria and insurance authorization.   Assistance  Recommended at Discharge Frequent or constant Supervision/Assistance  Patient can return home with the following Assistance with cooking/housework;A lot of help with walking and/or transfers;A little help with walking and/or transfers;Assist for transportation;Help with stairs or ramp for entrance    Functional Status Assessment  Patient has had a recent decline in their functional status and demonstrates the ability to make significant improvements in function in a reasonable and predictable amount of time.  Equipment Recommendations   (Pt educated on adaptive equipment options but pt stated preference for her husband to assist.)    Recommendations for Other Services       Precautions / Restrictions Precautions Precautions: Shoulder Shoulder Interventions: Shoulder sling/immobilizer;At all times;Off for dressing/bathing/exercises Precaution Booklet Issued: Yes (comment) Required Braces or Orthoses: Sling Restrictions Weight Bearing Restrictions: Yes LUE Weight Bearing: Non weight bearing Other Position/Activity Restrictions: Sling at all times except ADL/exercise Yes  Non weight bearing Yes  AROM elbow, wrist and hand to tolerance Yes  PROM of shoulder No  AROM of shoulder No      Mobility Bed Mobility Overal bed mobility: Needs Assistance Bed Mobility: Supine to Sit     Supine to sit: Mod assist, HOB elevated     General bed mobility comments: Mod As primarily needed at truk with cues for LUE NWB. LUE in sling    Transfers Overall transfer level: Needs assistance   Transfers: Sit to/from Stand, Bed to chair/wheelchair/BSC Sit to Stand: Min assist Stand pivot transfers: Min assist                Balance Overall balance assessment: History of Falls, Needs assistance Sitting-balance support: Feet supported Sitting balance-Leahy Scale: Fair Sitting balance - Comments: Posterior lean until feet flat on floor. Postural control: Posterior  lean Standing balance  support: Single extremity supported Standing balance-Leahy Scale: Poor Standing balance comment: Required extternal assist.                           ADL either performed or assessed with clinical judgement   ADL Overall ADL's : Needs assistance/impaired Eating/Feeding: Minimal assistance;Sitting   Grooming: Moderate assistance;Sitting   Upper Body Bathing: Moderate assistance;Sitting   Lower Body Bathing: Moderate assistance;Sitting/lateral leans;Sit to/from stand   Upper Body Dressing : Maximal assistance;Sitting   Lower Body Dressing: Moderate assistance;Sit to/from stand;Sitting/lateral leans;Maximal assistance   Toilet Transfer: Minimal assistance Toilet Transfer Details (indicate cue type and reason): LT HHA to stand from EOB and pivot to recliner with posterior LOB and need of gait belt assist to promote upright positioning. Cues to back up to recliner until felt behind LEs as pt attempted to sit too soon. Cues for RUE reach back and Min As to control descent. Toileting- Clothing Manipulation and Hygiene: Moderate assistance Toileting - Clothing Manipulation Details (indicate cue type and reason): For clothing management.     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 LT eye intact but pt tends to close LT eye to focus with RT.     Perception     Praxis      Pertinent Vitals/Pain Pain Assessment Pain Assessment: No/denies pain ("I'm still 90% numb". Denies pain)     Hand Dominance Right   Extremity/Trunk Assessment Upper Extremity Assessment Upper Extremity Assessment: LUE deficits/detail;RUE deficits/detail RUE Deficits / Details: Limited shoulder ROM to ~50 degrees.  elbow->Grip: WFL ROM with MMT of 4-/5 to 3+/5 RUE Sensation: WNL LUE Deficits / Details: Able to wiggle fingers LUE:  Unable to fully assess due to immobilization LUE Sensation: decreased light touch           Communication Communication Communication: No difficulties   Cognition Arousal/Alertness: Awake/alert Behavior During Therapy: WFL for tasks assessed/performed Overall Cognitive Status: Within Functional Limits for tasks assessed                                       General Comments       Exercises Other Exercises Other Exercises: Pt able to demonstrate hand open/close per handout on HEP. LUE still too numb to demonstrate the rest of exercises but OT demonstrated each and pt verbalized understanding and demonstrated on RUE.   Shoulder Instructions Shoulder Instructions Donning/doffing shirt without moving shoulder: Maximal assistance;Patient able to independently direct caregiver Method for sponge bathing under operated UE: Maximal assistance;Patient able to independently direct caregiver Donning/doffing sling/immobilizer: Maximal assistance;Patient able to independently direct caregiver Correct positioning of sling/immobilizer: Moderate assistance;Patient able to independently direct caregiver ROM for elbow, wrist and digits of operated UE: Patient able to independently direct caregiver;Supervision/safety Sling wearing schedule (on at all times/off for ADL's): Supervision/safety;Patient able to independently direct caregiver Proper positioning of operated UE when showering: Supervision/safety;Patient able to independently direct caregiver Positioning of UE while sleeping: Supervision/safety;Patient able to independently direct caregiver    Home Living Family/patient expects to be discharged to:: Private residence (ILF) Living Arrangements: Spouse/significant other Available Help at Discharge: Family;Available 24 hours/day Type of Home: Independent living facility Home Access: Level entry     Home Layout:  Other (Comment)     Bathroom Shower/Tub: Company secretary: Handicapped height Bathroom Accessibility: Yes How Accessible: Accessible via wheelchair;Accessible via walker Home Equipment: Grab bars - toilet;Grab bars - tub/shower;Rollator (4 wheels);Rolling Walker (2 wheels);Shower seat;Hand held shower head;Cane - single point;Wheelchair - manual   Additional Comments: Has not used WC since back surgery, and unsure what kind. Believes it is manual      Prior Functioning/Environment Prior Level of Function : Needs assist       Physical Assist : ADLs (physical)   ADLs (physical): IADLs;Bathing;Dressing Mobility Comments: 1 fall from LOB. Uses Rollator ADLs Comments: All IADLs provided. Spouse assists with shower and getting dressed.        OT Problem List: Impaired sensation;Decreased knowledge of precautions;Impaired balance (sitting and/or standing);Impaired UE functional use;Decreased range of motion;Decreased strength      OT Treatment/Interventions:      OT Goals(Current goals can be found in the care plan section) Acute Rehab OT Goals Patient Stated Goal: To go home tomorrow. Pt declined Acute PT stating that [facility] at [facility] is already set up. OT Goal Formulation: All assessment and education complete, DC therapy ADL Goals Additional ADL Goal #1: Pt will demonstrate UE/LE dressing, donning/doffing of sling, correct positioning of LUE, and compensatory strategies for LT axilla hygiene, all while correctly following all shoulder post-op precautions/restrictions.  OT Frequency:      Co-evaluation              AM-PAC OT "6 Clicks" Daily Activity     Outcome Measure Help from another person eating meals?: A Little Help from another person taking care of personal grooming?: A Lot Help from another person toileting, which includes using toliet, bedpan, or urinal?: A Lot Help from another person bathing (including washing, rinsing, drying)?: A Lot Help from another person to put on and taking off regular upper  body clothing?: A Lot Help from another person to put on and taking off regular lower body clothing?: A Lot 6 Click Score: 13   End of Session Equipment Utilized During Treatment: Gait belt;Other (comment);Oxygen (sling) Nurse Communication: Other (comment) (Lucy with SW on discharge plans. Unable to locate RN)  Activity Tolerance: Patient tolerated treatment well Patient left: in chair;with call bell/phone within reach;with SCD's reapplied  OT Visit Diagnosis: Unsteadiness on feet (R26.81);History of falling (Z91.81);Muscle weakness (generalized) (M62.81)                Time: 1315-1405 OT Time Calculation (min): 50 min Charges:  OT General Charges $OT Visit: 1 Visit OT Evaluation $OT Eval Low Complexity: 1 Low OT Treatments $Self Care/Home Management : 8-22 mins $Therapeutic Activity: 8-22 mins  Victorino Dike, OT Acute Rehab Services Office: [phone] 12/05/2022  Theodoro Clock 12/05/2022, 2:30 PM

## 2022-12-05 NOTE — Transfer of Care (Signed)
Immediate Anesthesia Transfer of Care Note  Patient: [name] Page  Procedure(s) Performed: REVERSE SHOULDER ARTHROPLASTY (Left: Shoulder)  Patient Location: PACU  Anesthesia Type:General  Level of Consciousness: awake, drowsy, and patient cooperative  Airway & Oxygen Therapy: Patient Spontanous Breathing and Patient connected to face mask oxygen  Post-op Assessment: Report given to RN and Post -op Vital signs reviewed and stable  Post vital signs: Reviewed and stable  Last Vitals:  Vitals Value Taken Time  BP 140/68 12/05/22 0853  Temp    Pulse 88 12/05/22 0854  Resp 9 12/05/22 0854  SpO2 100 % 12/05/22 0854  Vitals shown include unvalidated device data.  Last Pain:  Vitals:   12/05/22 0619  TempSrc:   PainSc: 0-No pain         Complications: No notable events documented.

## 2022-12-05 NOTE — Anesthesia Postprocedure Evaluation (Signed)
Anesthesia Post Note  Patient: [name] Page  Procedure(s) Performed: REVERSE SHOULDER ARTHROPLASTY (Left: Shoulder)     Patient location during evaluation: PACU Anesthesia Type: General Level of consciousness: awake and alert and oriented Pain management: pain level controlled Vital Signs Assessment: post-procedure vital signs reviewed and stable Respiratory status: spontaneous breathing, nonlabored ventilation and respiratory function stable Cardiovascular status: blood pressure returned to baseline and stable Postop Assessment: no apparent nausea or vomiting Anesthetic complications: no   No notable events documented.  Last Vitals:  Vitals:   12/05/22 0923 12/05/22 0930  BP:  122/60  Pulse: 74 71  Resp: 15 (!) 9  Temp:    SpO2: 95% 95%    Last Pain:  Vitals:   12/05/22 0930  TempSrc:   PainSc: 0-No pain                 Sixto Bowdish A.

## 2022-12-05 NOTE — Plan of Care (Signed)
  Problem: Education: Goal: Knowledge of the prescribed therapeutic regimen will improve Outcome: Progressing   Problem: Pain Management: Goal: Pain level will decrease with appropriate interventions Outcome: Progressing   Problem: Education: Goal: Knowledge of General Education information will improve Description: Including pain rating scale, medication(s)/side effects and non-pharmacologic comfort measures Outcome: Progressing   Problem: Activity: Goal: Risk for activity intolerance will decrease Outcome: Progressing   Problem: Nutrition: Goal: Adequate nutrition will be maintained Outcome: Progressing   Problem: Elimination: Goal: Will not experience complications related to bowel motility Outcome: Progressing   Problem: Pain Managment: Goal: General experience of comfort will improve Outcome: Progressing

## 2022-12-06 ENCOUNTER — Encounter (HOSPITAL_COMMUNITY): Payer: Self-pay | Admitting: Orthopedic Surgery

## 2022-12-06 DIAGNOSIS — M75102 Unspecified rotator cuff tear or rupture of left shoulder, not specified as traumatic: Secondary | ICD-10-CM | POA: Diagnosis not present

## 2022-12-06 LAB — HEMOGLOBIN AND HEMATOCRIT, BLOOD
HCT: 26.8 % — ABNORMAL LOW (ref 36.0–46.0)
Hemoglobin: 9.3 g/dL — ABNORMAL LOW (ref 12.0–15.0)

## 2022-12-06 LAB — BASIC METABOLIC PANEL
Anion gap: 11 (ref 5–15)
BUN: 12 mg/dL (ref 8–23)
CO2: 19 mmol/L — ABNORMAL LOW (ref 22–32)
Calcium: 8.7 mg/dL — ABNORMAL LOW (ref 8.9–10.3)
Chloride: 107 mmol/L (ref 98–111)
Creatinine, Ser: 0.53 mg/dL (ref 0.44–1.00)
GFR, Estimated: >60 mL/min (ref >60)
Glucose, Bld: 85 mg/dL (ref 70–99)
Potassium: 3.3 mmol/L — ABNORMAL LOW (ref 3.5–5.1)
Sodium: 137 mmol/L (ref 135–145)

## 2022-12-06 MED ORDER — POTASSIUM CHLORIDE CRYS ER 20 MEQ PO TBCR
40.0000 meq | EXTENDED_RELEASE_TABLET | Freq: Once | ORAL | Status: AC
  Administered 2022-12-06: 40 meq via ORAL
  Filled 2022-12-06: qty 2

## 2022-12-06 NOTE — Progress Notes (Signed)
Orthopedics Progress Note  Subjective: Patient feeling well this AM with no pain.  Objective:  Vitals:   12/06/22 0146 12/06/22 0456  BP: 130/67 (!) 151/62  Pulse: 80 70  Resp: 18 19  Temp: (!) 97.4 F (36.3 C) 97.6 F (36.4 C)  SpO2: 95% 93%    General: Awake and alert  Musculoskeletal: Shoulder dressing changed. Wound looks good Neurovascularly intact  Lab Results  Component Value Date   WBC 5.7 11/21/2022   HGB 9.3 (L) 12/06/2022   HCT 26.8 (L) 12/06/2022   MCV 94.2 11/21/2022   PLT 263 11/21/2022       Component Value Date/Time   NA 137 12/06/2022 0319   NA 142 05/02/2019 1031   K 3.3 (L) 12/06/2022 0319   CL 107 12/06/2022 0319   CO2 19 (L) 12/06/2022 0319   GLUCOSE 85 12/06/2022 0319   BUN 12 12/06/2022 0319   BUN 16 05/02/2019 1031   CREATININE 0.53 12/06/2022 0319   CREATININE 1.01 (H) 11/21/2022 1337   CALCIUM 8.7 (L) 12/06/2022 0319   GFRNONAA >60 12/06/2022 0319   GFRNONAA 53 (L) 11/21/2022 1337   GFRAA 87 05/02/2019 1031    Lab Results  Component Value Date   INR 1.1 06/04/2021   INR 1.02 08/18/2016   INR 1.02 07/19/2010    Assessment/Plan: POD #1 s/p Procedure(s): REVERSE SHOULDER ARTHROPLASTY Stable this AM. Home after therapy F/U in two weeks  Almedia Balls. Ranell Patrick, MD 12/06/2022 7:49 AM

## 2022-12-06 NOTE — Plan of Care (Signed)
  Problem: Education: Goal: Knowledge of the prescribed therapeutic regimen will improve Outcome: Progressing   Problem: Pain Management: Goal: Pain level will decrease with appropriate interventions Outcome: Progressing   Problem: Activity: Goal: Risk for activity intolerance will decrease Outcome: Progressing   

## 2022-12-06 NOTE — Discharge Summary (Signed)
In most cases prophylactic antibiotics for Dental procdeures after total joint surgery are not necessary.  Exceptions are as follows:  1. History of prior total joint infection  2. Severely immunocompromised (Organ Transplant, cancer chemotherapy, Rheumatoid biologic meds such as Humera)  3. Poorly controlled diabetes (A1C &gt; 8.0, blood glucose over 200)  If you have one of these conditions, contact your surgeon for an antibiotic prescription, prior to your dental procedure. Orthopedic Discharge Summary        Physician Discharge Summary  Patient ID: [name] Page MRN: [id number] DOB/AGE: 06-13-1931 89 y.o.  Admit date: 12/05/2022 Discharge date: 12/06/2022   Procedures:  Procedure(s) (LRB): REVERSE SHOULDER ARTHROPLASTY (Left)  Attending Physician:  Dr. Malon Kindle  Admission Diagnoses:   Left shoulder OA, RCT arthropathy  Discharge Diagnoses:  same   Past Medical History:  Diagnosis Date   Alopecia 10/28/2016   Anginal pain (HCC)    Anxiety    Arthritis    Cancer (HCC)    Cervical spondylosis    C3-4 AND C4-5   Chest pain 07/28/2008   H/O, normal stress nuclear EF 78%   Chronic cough 07/12/2014   Collapsed lung 07/2010   being treated at [facility] - in left lung   Complication of anesthesia    Cough 07/30/2010   Qualifier: Diagnosis of  By: Marchelle Gearing MD, Murali     Cystocele 05/23/2015   Cystocele, midline 05/02/2013   DEEP VEIN THROMBOSIS/PHLEBITIS 07/30/2010   Qualifier: History of  By: Yancey Flemings CMA, Jennifer     Dislocation closed, shoulder 02/2013   DVT (deep venous thrombosis) (HCC) 2006   Encounter for therapeutic drug monitoring 10/03/2014   GERD (gastroesophageal reflux disease)    occ   Headache    hx   High cholesterol    History of recurrent UTIs 09/24/2011   HYPERLIPIDEMIA 07/30/2010   Qualifier: Diagnosis of  By: Yancey Flemings CMA, Jennifer     Hypertension    HYPERTENSION 07/30/2010   Qualifier: Diagnosis of  By: Yancey Flemings CMA,  Jennifer     Hypothyroidism    Nasal itching 01/30/2014   Osteopenia 05/23/2015   Peripheral vascular disease (HCC) 2006   dvt's and 50 yrs ago   Pleuritic pain 12/17/2015   PONV (postoperative nausea and vomiting)    Primary localized osteoarthritis of right hip 08/26/2016   Primary osteoarthritis of right hip 08/26/2016   Pulmonary collapse 07/30/2010   Qualifier: Diagnosis of  By: Marchelle Gearing MD, Murali     PULMONARY NODULE 07/30/2010   Qualifier: Diagnosis of  By: Marchelle Gearing MD, Murali     Rash and nonspecific skin eruption 01/02/2015   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 Patient tolerated the procedure well and brought to the recovery room in good condition and subsequently to the floor. Patient had an uncomplicated hospital course and was stable for discharge.   Disposition: Discharge disposition: 01-Home or Self Care      with follow up in 2 weeks    Follow-up Information     Beverely Low, MD. Call in 2 week(s).   Specialty: Orthopedic Surgery Why: call [phone] for appt Contact information: [address] [place] [address] [phone]  Dental Antibiotics:  In most cases prophylactic antibiotics for Dental procdeures after total joint surgery are not necessary.  Exceptions are as follows:  1. History of prior total joint infection  2. Severely immunocompromised (Organ Transplant, cancer chemotherapy, Rheumatoid biologic meds such as Humera)  3. Poorly controlled diabetes (A1C &gt; 8.0, blood glucose over 200)  If you have one of these conditions, contact your surgeon for an antibiotic prescription, prior to your dental procedure.  Discharge  Instructions     Call MD / Call 911   Complete by: As directed    If you experience chest pain or shortness of breath, CALL 911 and be transported to the hospital emergency room.  If you develope a fever above 101 F, pus (white drainage) or increased drainage or redness at the wound, or calf pain, call your surgeon's office.   Constipation Prevention   Complete by: As directed    Drink plenty of fluids.  Prune juice may be helpful.  You may use a stool softener, such as Colace (over the counter) 100 mg twice a day.  Use MiraLax (over the counter) for constipation as needed.   Diet - low sodium heart healthy   Complete by: As directed    Increase activity slowly as tolerated   Complete by: As directed    Post-operative opioid taper instructions:   Complete by: As directed    POST-OPERATIVE OPIOID TAPER INSTRUCTIONS: It is important to wean off of your opioid medication as soon as possible. If you do not need pain medication after your surgery it is ok to stop day one. Opioids include: Codeine, Hydrocodone(Norco, Vicodin), Oxycodone(Percocet, oxycontin) and hydromorphone amongst others.  Long term and even short term use of opiods can cause: Increased pain response Dependence Constipation Depression Respiratory depression And more.  Withdrawal symptoms can include Flu like symptoms Nausea, vomiting And more Techniques to manage these symptoms Hydrate well Eat regular healthy meals Stay active Use relaxation techniques(deep breathing, meditating, yoga) Do Not substitute Alcohol to help with tapering If you have been on opioids for less than two weeks and do not have pain than it is ok to stop all together.  Plan to wean off of opioids This plan should start within one week post op of your joint replacement. Maintain the same interval or time between taking each dose and first decrease the dose.  Cut the total daily intake of opioids by one tablet each day Next start to increase  the time between doses. The last dose that should be eliminated is the evening dose.          Allergies as of 12/06/2022   No Known Allergies      Medication List     TAKE these medications    acyclovir 400 MG tablet Commonly known as: ZOVIRAX TAKE ONE (1) TABLET BY MOUTH TWO (2) TIMES DAILY   amLODipine 10 MG tablet Commonly known as: NORVASC Take 10 mg by mouth daily.   b complex vitamins capsule Take 1 capsule by mouth daily.   furosemide 20 MG tablet Commonly known as: LASIX Take 20 mg by mouth daily as needed for edema.   gemfibrozil 600 MG tablet Commonly known as: LOPID Take 600 mg by mouth 2 (two) times daily before a meal.   HYDROcodone-acetaminophen 5-325 MG tablet Commonly known as: NORCO/VICODIN Take 0.5 tablets by mouth every 6 (six) hours as needed for moderate pain or severe pain. What changed:  how much to take reasons to take this  LORazepam 1 MG tablet Commonly known as: ATIVAN Take 0.5 mg by mouth at bedtime as needed for anxiety.   losartan 50 MG tablet Commonly known as: COZAAR Take 50 mg by mouth daily.   multivitamin capsule Take 1 capsule by mouth daily.   naproxen sodium 220 MG tablet Commonly known as: ALEVE Take 440 mg by mouth daily as needed (pain).   pantoprazole 20 MG tablet Commonly known as: PROTONIX Take 40 mg by mouth daily at 12 noon.   polyethylene glycol powder 17 GM/SCOOP powder Commonly known as: GLYCOLAX/MIRALAX Take 17 g by mouth at bedtime.   prednisoLONE acetate 1 % ophthalmic suspension Commonly known as: PRED FORTE Place 1 drop into both eyes 4 (four) times daily.   pregabalin 25 MG capsule Commonly known as: LYRICA TAKE ONE (1) CAPSULE BY MOUTH 2 TIMES DAILY   trimethoprim 100 MG tablet Commonly known as: TRIMPEX Take 100 mg by mouth daily.          Signed: Verlee Rossetti 12/06/2022, 7:50 AM  [facility] is now [facility] [address], [address],  [place], [place] [address] Phone: [phone] Facebook  Instagram  [employer]

## 2022-12-06 NOTE — Evaluation (Signed)
Occupational Therapy Evaluation Patient Details Name: [name] Page MRN: [id number] DOB: 11-25-1930 Today's Date: 12/06/2022   History of Present Illness [name] Page  89 y.o. female presenting with  PRE-OPERATIVE DIAGNOSIS:  left shoulder rotator cuff tear arthropathy. Pt underwent: REVERSE SHOULDER ARTHROPLASTY (Left) - CHOICE WITH INTERSCALENE BLOCK  under Dr. Ranell Patrick.   Clinical Impression   Patient is a 89 year old female who s/p shoulder replacement without functional use of left upper extremity secondary to effects of surgery and interscalene block and shoulder precautions. Therapist provided education and instruction to patient and spouse in regards to exercises, precautions, positioning, donning upper extremity clothing and bathing while maintaining shoulder precautions, ice and edema management and donning/doffing sling. Patient and spouse verbalized understanding and demonstrated as needed. Patient needed assistance to donn shirt, underwear, pants, socks and shoes and provided with instruction on compensatory strategies to perform ADLs. Patient to follow up with MD for further therapy needs.        Recommendations for follow up therapy are one component of a multi-disciplinary discharge planning process, led by the attending physician.  Recommendations may be updated based on patient status, additional functional criteria and insurance authorization.   Assistance Recommended at Discharge Frequent or constant Supervision/Assistance  Patient can return home with the following Assistance with cooking/housework;A lot of help with walking and/or transfers;A little help with walking and/or transfers;Assist for transportation;Help with stairs or ramp for entrance    Functional Status Assessment  Patient has had a recent decline in their functional status and demonstrates the ability to make significant improvements in function in a reasonable and predictable amount of time.  Equipment  Recommendations  None recommended by OT       Precautions / Restrictions Precautions Precautions: Shoulder Shoulder Interventions: Shoulder sling/immobilizer;At all times;Off for dressing/bathing/exercises Precaution Booklet Issued: Yes (comment) Required Braces or Orthoses: Sling Restrictions Weight Bearing Restrictions: Yes LUE Weight Bearing: Non weight bearing Other Position/Activity Restrictions: Sling at all times except ADL/exercises  Non weight bearing  AROM elbow, wrist and hand to tolerance      Mobility Bed Mobility Overal bed mobility: Needs Assistance Bed Mobility: Supine to Sit     Supine to sit: Min assist     General bed mobility comments: with cues not to push throush LUE to attempt to sit up EOB        Balance Overall balance assessment: History of Falls, Needs assistance Sitting-balance support: Feet supported Sitting balance-Leahy Scale: Fair     Standing balance support: Single extremity supported Standing balance-Leahy Scale: Poor                             ADL either performed or assessed with clinical judgement   ADL Overall ADL's : Needs assistance/impaired Eating/Feeding: Sitting;Set up   Grooming: Set up;Sitting   Upper Body Bathing: Moderate assistance;Sitting Upper Body Bathing Details (indicate cue type and reason): with husband educated on proper positioning. Lower Body Bathing: Moderate assistance;Sitting/lateral leans;Sit to/from stand   Upper Body Dressing : Maximal assistance;Sitting Upper Body Dressing Details (indicate cue type and reason): husband able to demonstrate understanding with maintaining ROM restrictions Lower Body Dressing: Moderate assistance;Sit to/from stand;Sitting/lateral leans;Maximal assistance   Toilet Transfer: Ambulation Toilet Transfer Details (indicate cue type and reason): SPC with increased time. paient was noted to get unsteady and use cane less sequenctially with increased speed.  patient and husband were educated on importance of going slow.  Vision Patient Visual Report: No change from baseline              Pertinent Vitals/Pain Pain Assessment Pain Assessment: No/denies pain        Extremity/Trunk Assessment Upper Extremity Assessment LUE Deficits / Details: able to wiggle digits. patient attempted to use LUE to abduct during tasks with continued constant cues to keep elbow down       Cervical / Trunk Assessment Cervical / Trunk Assessment: Kyphotic   Communication Communication Communication: No difficulties   Cognition Arousal/Alertness: Awake/alert Behavior During Therapy: WFL for tasks assessed/performed Overall Cognitive Status: Difficult to assess         General Comments: patient was noted to have a difficult time maintaining ROM restrictions during session. husband was present and able to cue patient not to use UE           Shoulder Instructions Shoulder Instructions Donning/doffing shirt without moving shoulder: Caregiver independent with task Method for sponge bathing under operated UE: Caregiver independent with task Donning/doffing sling/immobilizer: Caregiver independent with task Correct positioning of sling/immobilizer: Caregiver independent with task ROM for elbow, wrist and digits of operated UE: Caregiver independent with task Sling wearing schedule (on at all times/off for ADL's): Caregiver independent with task Proper positioning of operated UE when showering: Caregiver independent with task Positioning of UE while sleeping: Caregiver independent with task    Home Living Family/patient expects to be discharged to:: Private residence Living Arrangements: Spouse/significant other Available Help at Discharge: Family;Available 24 hours/day Type of Home: Independent living facility Home Access: Level entry     Home Layout: Other (Comment)     Bathroom Shower/Tub: Higher education careers adviser: Handicapped height Bathroom Accessibility: Yes How Accessible: Accessible via wheelchair;Accessible via walker Home Equipment: Grab bars - toilet;Grab bars - tub/shower;Rollator (4 wheels);Rolling Walker (2 wheels);Shower seat;Hand held shower head;Cane - single point;Wheelchair - manual          Prior Functioning/Environment Prior Level of Function : Needs assist               ADLs Comments: All IADLs provided. Spouse assists with shower and getting dressed.        OT Problem List: Impaired sensation;Decreased knowledge of precautions;Impaired balance (sitting and/or standing);Impaired UE functional use;Decreased range of motion;Decreased strength      OT Treatment/Interventions:      OT Goals(Current goals can be found in the care plan section) Acute Rehab OT Goals Patient Stated Goal: to go home today OT Goal Formulation: All assessment and education complete, DC therapy  OT Frequency:         AM-PAC OT "6 Clicks" Daily Activity     Outcome Measure Help from another person eating meals?: A Little Help from another person taking care of personal grooming?: A Little Help from another person toileting, which includes using toliet, bedpan, or urinal?: A Little Help from another person bathing (including washing, rinsing, drying)?: A Lot Help from another person to put on and taking off regular upper body clothing?: A Lot Help from another person to put on and taking off regular lower body clothing?: A Lot 6 Click Score: 15   End of Session Equipment Utilized During Treatment: Gait belt;Other (comment) [facility]) Nurse Communication: Other (comment) (ok to participate in session.)  Activity Tolerance: Patient tolerated treatment well Patient left: in bed;with call bell/phone within reach;with family/visitor present;with nursing/sitter in room  OT Visit Diagnosis: Unsteadiness on feet (R26.81);History of falling (Z91.81);Muscle weakness (generalized) (M62.81)  Time: 1610-9604 OT Time Calculation (min): 38 min Charges:  OT General Charges $OT Visit: 1 Visit OT Evaluation $OT Eval Low Complexity: 1 Low OT Treatments $Self Care/Home Management : 23-37 mins  Lidwina Kaner OTR/L, MS Acute Rehabilitation Department Office# [phone]   Selinda Flavin 12/06/2022, 2:49 PM

## 2022-12-16 ENCOUNTER — Other Ambulatory Visit: Payer: Self-pay

## 2022-12-17 ENCOUNTER — Other Ambulatory Visit: Payer: Self-pay

## 2022-12-17 ENCOUNTER — Other Ambulatory Visit: Payer: Self-pay | Admitting: Hematology and Oncology

## 2022-12-17 DIAGNOSIS — C9000 Multiple myeloma not having achieved remission: Secondary | ICD-10-CM

## 2022-12-19 ENCOUNTER — Other Ambulatory Visit: Payer: Medicare PPO

## 2022-12-19 ENCOUNTER — Ambulatory Visit: Payer: Medicare PPO | Admitting: Physician Assistant

## 2022-12-19 ENCOUNTER — Ambulatory Visit: Payer: Medicare PPO

## 2022-12-29 ENCOUNTER — Emergency Department (HOSPITAL_BASED_OUTPATIENT_CLINIC_OR_DEPARTMENT_OTHER)
Admission: EM | Admit: 2022-12-29 | Discharge: 2022-12-29 | Disposition: A | Discharge status: Home / Self Care | Payer: Medicare PPO | Attending: Emergency Medicine | Admitting: Emergency Medicine

## 2022-12-29 ENCOUNTER — Encounter (HOSPITAL_BASED_OUTPATIENT_CLINIC_OR_DEPARTMENT_OTHER): Payer: Self-pay | Admitting: Urology

## 2022-12-29 ENCOUNTER — Encounter: Payer: Self-pay | Admitting: Hematology and Oncology

## 2022-12-29 ENCOUNTER — Other Ambulatory Visit (HOSPITAL_BASED_OUTPATIENT_CLINIC_OR_DEPARTMENT_OTHER): Payer: Self-pay

## 2022-12-29 ENCOUNTER — Emergency Department (HOSPITAL_BASED_OUTPATIENT_CLINIC_OR_DEPARTMENT_OTHER): Payer: Medicare PPO

## 2022-12-29 DIAGNOSIS — M25512 Pain in left shoulder: Secondary | ICD-10-CM | POA: Diagnosis present

## 2022-12-29 MED ORDER — METHOCARBAMOL 500 MG PO TABS
500.0000 mg | ORAL_TABLET | Freq: Two times a day (BID) | ORAL | 0 refills | Status: DC
  Filled 2022-12-29: qty 20, 10d supply, fill #0

## 2022-12-29 MED ORDER — LIDOCAINE 5 % EX PTCH
1.0000 | MEDICATED_PATCH | CUTANEOUS | 0 refills | Status: DC
  Filled 2022-12-29: qty 30, 30d supply, fill #0

## 2022-12-29 MED ORDER — LIDOCAINE 5 % EX PTCH: 1.0000 | MEDICATED_PATCH | CUTANEOUS | Status: DC

## 2022-12-29 NOTE — Discharge Instructions (Signed)
Follow-up with your surgeon.  Recommend using Robaxin as prescribed.  This is a muscle relaxant.  Make sure you stagger taking the narcotic pain medicine and Robaxin.  Use lidocaine patches daily.

## 2022-12-29 NOTE — ED Notes (Signed)
ED Provider at bedside. 

## 2022-12-29 NOTE — ED Triage Notes (Signed)
Pt states recent sx to left shoulder 4/26 Started having new intense pain that started last night, took vicodin with no relief  Wearing sling, pain with any movement   Unable to reach surgeon

## 2022-12-29 NOTE — ED Provider Notes (Signed)
[facility] Provider Note   CSN: [id number] Arrival date & time: 12/29/22  1315     History  Chief Complaint  Patient presents with   Shoulder Pain    [name] Page is a 89 y.o. female.  Patient here with left shoulder pain.  Was having some pain in her left shoulder with movement this morning.  She had shoulder surgery about 3 weeks ago.  Has been in a sling.  Has not really done a lot of physical therapy but has been doing some range of motion at the elbow and wrist.  Pain is only when she moves her shoulder in a certain way.  She has no chest pain or shortness of breath.  She has no fever.  No swelling or redness or purulent drainage from surgical site.  She took a narcotic pain medicine this morning that was prescribed that has helped her pain.  She denies any fall or traumas.  The history is provided by the patient.       Home Medications Prior to Admission medications   Medication Sig Start Date End Date Taking? Authorizing Provider  lidocaine (LIDODERM) 5 % Place 1 patch onto the skin daily. Remove & Discard patch within 12 hours or as directed by MD 12/29/22  Yes Trayvon Trumbull, DO  methocarbamol (ROBAXIN) 500 MG tablet Take 1 tablet (500 mg total) by mouth 2 (two) times daily. 12/29/22  Yes Deleah Tison, DO  acyclovir (ZOVIRAX) 400 MG tablet TAKE ONE (1) TABLET BY MOUTH TWO (2) TIMES DAILY 12/17/22   Jaci Standard, MD  amLODipine (NORVASC) 10 MG tablet Take 10 mg by mouth daily.    [provider]  b complex vitamins capsule Take 1 capsule by mouth daily.    [provider]  furosemide (LASIX) 20 MG tablet Take 20 mg by mouth daily as needed for edema.    [provider]  gemfibrozil (LOPID) 600 MG tablet Take 600 mg by mouth 2 (two) times daily before a meal.    [provider]  HYDROcodone-acetaminophen (NORCO/VICODIN) 5-325 MG tablet Take 0.5 tablets by mouth every 6 (six) hours as  needed for moderate pain or severe pain. 12/05/22   Beverely Low, MD  LORazepam (ATIVAN) 1 MG tablet Take 0.5 mg by mouth at bedtime as needed for anxiety.    [provider]  losartan (COZAAR) 50 MG tablet Take 50 mg by mouth daily.    [provider]  Multiple Vitamin (MULTIVITAMIN) capsule Take 1 capsule by mouth daily.    [provider]  naproxen sodium (ALEVE) 220 MG tablet Take 440 mg by mouth daily as needed (pain).    [provider]  pantoprazole (PROTONIX) 20 MG tablet Take 40 mg by mouth daily at 12 noon.    [provider]  polyethylene glycol powder (GLYCOLAX/MIRALAX) 17 GM/SCOOP powder Take 17 g by mouth at bedtime. 07/26/19   [provider]  prednisoLONE acetate (PRED FORTE) 1 % ophthalmic suspension Place 1 drop into both eyes 4 (four) times daily. 11/18/22   [provider]  pregabalin (LYRICA) 25 MG capsule TAKE ONE (1) CAPSULE BY MOUTH 2 TIMES DAILY 11/24/22   Georga Kaufmann T, PA-C  trimethoprim (TRIMPEX) 100 MG tablet Take 100 mg by mouth daily. 11/18/22   [provider]      Allergies    Patient has no known allergies.    Review of Systems   Review of  Systems  Physical Exam Updated Vital Signs BP 131/64 (BP Location: Left Arm)   Pulse 71   Temp 97.7 F (36.5 C)   Resp 20   Ht 4\' 11"  (1.499 m)   Wt 49.9 kg   SpO2 97%   BMI 22.22 kg/m  Physical Exam Vitals and nursing note reviewed.  Constitutional:      General: She is not in acute distress.    Appearance: She is well-developed.  HENT:     Head: Normocephalic and atraumatic.     Nose: Nose normal.  Eyes:     Extraocular Movements: Extraocular movements intact.     Conjunctiva/sclera: Conjunctivae normal.     Pupils: Pupils are equal, round, and reactive to light.  Cardiovascular:     Rate and Rhythm: Normal rate and regular rhythm.     Pulses: Normal pulses.     Heart sounds: No murmur heard. Pulmonary:     Effort: Pulmonary effort  is normal. No respiratory distress.     Breath sounds: Normal breath sounds.  Abdominal:     Palpations: Abdomen is soft.     Tenderness: There is no abdominal tenderness.  Musculoskeletal:        General: Tenderness present. No swelling.     Cervical back: Neck supple.     Comments: She is got some point tenderness in the subscapularis area/posterior left shoulder  Skin:    General: Skin is warm and dry.     Capillary Refill: Capillary refill takes less than 2 seconds.     Comments: Surgical site in the left shoulder is clean dry and intact, there is no redness or swelling or purulent drainage  Neurological:     General: No focal deficit present.     Mental Status: She is alert and oriented to person, place, and time.     Sensory: No sensory deficit.     Motor: No weakness.  Psychiatric:        Mood and Affect: Mood normal.     ED Results / Procedures / Treatments   Labs (all labs ordered are listed, but only abnormal results are displayed) Labs Reviewed - No data to display  EKG None  Radiology DG Shoulder Left  Result Date: 12/29/2022 CLINICAL DATA:  Postop 12/05/2022 reverse shoulder arthroplasty. Pain today. EXAM: LEFT SHOULDER - 2+ VIEW COMPARISON:  12/05/2022 FINDINGS: Similar appearance of left shoulder arthroplasty, without acute hardware complication. Visualized portion of the left hemithorax is normal. Vertebral augmentation at multiple thoracic levels, incompletely imaged. IMPRESSION: Expected appearance after left shoulder arthroplasty. Electronically Signed   By: Jeronimo Greaves M.D.   On: 12/29/2022 13:45    Procedures Procedures    Medications Ordered in ED Medications  lidocaine (LIDODERM) 5 % 1 patch (1 patch Transdermal Not Given 12/29/22 1531)    ED Course/ Medical Decision Making/ A&P                             Medical Decision Making Amount and/or Complexity of Data Reviewed Radiology: ordered.  Risk Prescription drug management.   [name] Page is here with left shoulder pain.  She had surgery to fix her shoulder a few weeks ago.  Normal vitals.  No fever.  She has been in the sling since.  She is neurovascular neuromuscular intact on exam.  Surgical site is clean dry and intact.  There is no redness or swelling or any signs of infection of  the shoulder.  X-ray per radiology report with no hardware issues.  No fracture.  No effusion.  There is no swelling of her left arm.  She is got strong pulses.  She is tender to the posterior portion of the left shoulder.  My suspicion is that this is likely muscle spasm/MSK related pain.  Have no concern for blood clot or infectious process.  She is taking narcotic pain medicine and will add lidocaine patches and Robaxin and have her follow-up with surgeon.  Discharged in good condition.  This chart was dictated using voice recognition software.  Despite best efforts to proofread,  errors can occur which can change the documentation meaning.         Final Clinical Impression(s) / ED Diagnoses Final diagnoses:  Acute pain of left shoulder    Rx / DC Orders ED Discharge Orders          Ordered    lidocaine (LIDODERM) 5 %  Every 24 hours        12/29/22 1556    methocarbamol (ROBAXIN) 500 MG tablet  2 times daily        12/29/22 1556              [place], Madelaine Bhat, DO 12/29/22 1559

## 2022-12-31 ENCOUNTER — Other Ambulatory Visit: Payer: Self-pay | Admitting: Hematology and Oncology

## 2022-12-31 DIAGNOSIS — C9000 Multiple myeloma not having achieved remission: Secondary | ICD-10-CM

## 2023-01-06 ENCOUNTER — Ambulatory Visit
Admission: RE | Admit: 2023-01-06 | Discharge: 2023-01-06 | Disposition: A | Discharge status: Home / Self Care | Payer: Medicare PPO | Source: Ambulatory Visit | Attending: Physician Assistant | Admitting: Physician Assistant

## 2023-01-06 ENCOUNTER — Other Ambulatory Visit: Payer: Self-pay | Admitting: Physician Assistant

## 2023-01-06 DIAGNOSIS — M25812 Other specified joint disorders, left shoulder: Secondary | ICD-10-CM

## 2023-01-12 DIAGNOSIS — E785 Hyperlipidemia, unspecified: Secondary | ICD-10-CM | POA: Diagnosis not present

## 2023-01-12 DIAGNOSIS — I700 Atherosclerosis of aorta: Secondary | ICD-10-CM | POA: Diagnosis not present

## 2023-01-12 DIAGNOSIS — I10 Essential (primary) hypertension: Secondary | ICD-10-CM | POA: Diagnosis not present

## 2023-01-12 DIAGNOSIS — R2689 Other abnormalities of gait and mobility: Secondary | ICD-10-CM | POA: Diagnosis not present

## 2023-01-15 ENCOUNTER — Inpatient Hospital Stay: Payer: Medicare PPO | Admitting: Hematology and Oncology

## 2023-01-15 ENCOUNTER — Inpatient Hospital Stay: Payer: Medicare PPO

## 2023-01-15 ENCOUNTER — Inpatient Hospital Stay: Payer: Medicare PPO | Attending: Hematology and Oncology

## 2023-01-15 VITALS — BP 134/65 | HR 81 | Temp 97.9°F | Resp 16 | Wt 110.9 lb

## 2023-01-15 VITALS — BP 126/59 | HR 68 | Resp 18

## 2023-01-15 DIAGNOSIS — E538 Deficiency of other specified B group vitamins: Secondary | ICD-10-CM | POA: Diagnosis not present

## 2023-01-15 DIAGNOSIS — T451X5D Adverse effect of antineoplastic and immunosuppressive drugs, subsequent encounter: Secondary | ICD-10-CM | POA: Insufficient documentation

## 2023-01-15 DIAGNOSIS — C9000 Multiple myeloma not having achieved remission: Secondary | ICD-10-CM | POA: Insufficient documentation

## 2023-01-15 DIAGNOSIS — G620 Drug-induced polyneuropathy: Secondary | ICD-10-CM | POA: Diagnosis not present

## 2023-01-15 DIAGNOSIS — Z800 Family history of malignant neoplasm of digestive organs: Secondary | ICD-10-CM | POA: Diagnosis not present

## 2023-01-15 DIAGNOSIS — Z79899 Other long term (current) drug therapy: Secondary | ICD-10-CM | POA: Diagnosis not present

## 2023-01-15 DIAGNOSIS — Z79620 Long term (current) use of immunosuppressive biologic: Secondary | ICD-10-CM | POA: Diagnosis not present

## 2023-01-15 DIAGNOSIS — Z5112 Encounter for antineoplastic immunotherapy: Secondary | ICD-10-CM | POA: Diagnosis present

## 2023-01-15 LAB — CMP (CANCER CENTER ONLY)
ALT: 6 U/L (ref 0–44)
AST: 16 U/L (ref 15–41)
Albumin: 4.3 g/dL (ref 3.5–5.0)
Alkaline Phosphatase: 112 U/L (ref 38–126)
Anion gap: 11 (ref 5–15)
BUN: 24 mg/dL — ABNORMAL HIGH (ref 8–23)
CO2: 22 mmol/L (ref 22–32)
Calcium: 9.4 mg/dL (ref 8.9–10.3)
Chloride: 104 mmol/L (ref 98–111)
Creatinine: 1.08 mg/dL — ABNORMAL HIGH (ref 0.44–1.00)
GFR, Estimated: 49 mL/min — ABNORMAL LOW (ref >60)
Glucose, Bld: 99 mg/dL (ref 70–99)
Potassium: 4.1 mmol/L (ref 3.5–5.1)
Sodium: 137 mmol/L (ref 135–145)
Total Bilirubin: 0.3 mg/dL (ref 0.3–1.2)
Total Protein: 7.5 g/dL (ref 6.5–8.1)

## 2023-01-15 LAB — CBC WITH DIFFERENTIAL (CANCER CENTER ONLY)
Abs Immature Granulocytes: 0.02 10*3/uL (ref 0.00–0.07)
Basophils Absolute: 0 10*3/uL (ref 0.0–0.1)
Basophils Relative: 1 %
Eosinophils Absolute: 0.1 10*3/uL (ref 0.0–0.5)
Eosinophils Relative: 2 %
HCT: 31 % — ABNORMAL LOW (ref 36.0–46.0)
Hemoglobin: 10.4 g/dL — ABNORMAL LOW (ref 12.0–15.0)
Immature Granulocytes: 0 %
Lymphocytes Relative: 22 %
Lymphs Abs: 1.2 10*3/uL (ref 0.7–4.0)
MCH: 31.7 pg (ref 26.0–34.0)
MCHC: 33.5 g/dL (ref 30.0–36.0)
MCV: 94.5 fL (ref 80.0–100.0)
Monocytes Absolute: 0.7 10*3/uL (ref 0.1–1.0)
Monocytes Relative: 13 %
Neutro Abs: 3.5 10*3/uL (ref 1.7–7.7)
Neutrophils Relative %: 62 %
Platelet Count: 301 10*3/uL (ref 150–400)
RBC: 3.28 MIL/uL — ABNORMAL LOW (ref 3.87–5.11)
RDW: 12.9 % (ref 11.5–15.5)
WBC Count: 5.6 10*3/uL (ref 4.0–10.5)
nRBC: 0 % (ref 0.0–0.2)

## 2023-01-15 LAB — LACTATE DEHYDROGENASE: LDH: 151 U/L (ref 98–192)

## 2023-01-15 MED ORDER — DARATUMUMAB-HYALURONIDASE-FIHJ 1800-30000 MG-UT/15ML ~~LOC~~ SOLN
1800.0000 mg | Freq: Once | SUBCUTANEOUS | Status: AC
  Administered 2023-01-15: 1800 mg via SUBCUTANEOUS
  Filled 2023-01-15: qty 15

## 2023-01-15 MED ORDER — ACETAMINOPHEN 325 MG PO TABS
650.0000 mg | ORAL_TABLET | Freq: Once | ORAL | Status: AC
  Administered 2023-01-15: 650 mg via ORAL
  Filled 2023-01-15: qty 2

## 2023-01-15 MED ORDER — DEXAMETHASONE 4 MG PO TABS
20.0000 mg | ORAL_TABLET | Freq: Once | ORAL | Status: AC
  Administered 2023-01-15: 20 mg via ORAL
  Filled 2023-01-15: qty 5

## 2023-01-15 MED ORDER — CYANOCOBALAMIN 1000 MCG/ML IJ SOLN
1000.0000 ug | Freq: Once | INTRAMUSCULAR | Status: AC
  Administered 2023-01-15: 1000 ug via INTRAMUSCULAR
  Filled 2023-01-15: qty 1

## 2023-01-15 MED ORDER — DIPHENHYDRAMINE HCL 25 MG PO CAPS
50.0000 mg | ORAL_CAPSULE | Freq: Once | ORAL | Status: AC
  Administered 2023-01-15: 50 mg via ORAL
  Filled 2023-01-15: qty 2

## 2023-01-15 NOTE — Progress Notes (Signed)
[facility] [phone]   [phone]  PROGRESS NOTE  Patient Care Team: Geoffry Paradise, MD as PCP - General (Internal Medicine)  Hematological/Oncological History # IgG Lambda Multiple Myeloma 06/04/2021: WBC 5.5, Hgb 11.1, MCV 101.3, Plt 306 07/24/2021: M protein 2.3, IFE shows IgG Lambda monoclonal specificity. Cr 0.9 08/07/2021: Establish care with Dr. Leonides Schanz 09/27/2021: Bmbx showed hypercellular bone marrow with plasma cell neoplasm, increased number of atypical plasma cells averaging 20% of all cells in the aspirate 10/18/2021: Cycle 1 of Velcade/Dexamethasone 11/08/2021: Cycle 2 of Velcade/Dexamethasone 11/29/2021: Cycle 3 of Velcade/Dexamethasone 12/20/2021: Cycle 4 of Velcade/Dexamethasone 01/10/2022: Cycle 5 of Velcade/Dexamethasone 01/31/2022: Cycle 6 of Velcade/Dexamethasone 02/28/2022: Cycle 7 of Velcade/Dexamethasone. Dose reduced velcade due to neuropathy  03/14/2022: Cycle 1 Day 1 of Dara/Dex (velcade d/c due to neuropathy).  04/11/2022: Cycle 2 Day 1 of Dara/Dex 05/09/2022: Cycle 3 Day 1 of Dara/Dex 06/06/2022: Cycle 4 Day 1 of Dara/Dex 07/04/2022: Cycle 5 Day 1 of Dara/Dex 08/01/2022: Cycle 6 Day 1 of Dara/Dex 08/29/2022: Cycle 7 Day 1 of Dara/Dex 09/26/2022: Cycle 8 Day 1 of Dara/Dex 10/24/2022: Cycle 9 Day 1 of Dara/Dex 11/21/2022: Cycle 10 Day 1 of Dara/Dex 01/15/2023: Cycle 11 Day 1 of Dara/Dex  Interval History:  [name] Page 89 y.o. female with medical history significant for  IgG lambda multiple myeloma who presents for a follow up visit. The patient's last visit was on 11/21/2022. In the interim, patient has had no major changes in her health. She is due for Cycle 11, Day 1.  On exam today [name] Page reports her shoulder surgery went well in the interim since her last visit.  She reports that she is now on very little pain and she is in the last phase of her physical therapy.  She reports good mobility and daily improvement.  She  reports that her last treatment of Darzalex went well.  She received the injection on the right side and tolerated it without difficulty.  She notes that she was diagnosed with a urinary tract infection yesterday and has been given Cipro 500 mg twice daily and will be starting it shortly.  She notes her energy levels are good and her appetite has been strong.  Overall she is willing and able to proceed with chemotherapy treatment at this time..  She denies fevers, chills, night sweats, shortness of breath, chest pain or cough.  She has no other complaints. Rest of the 10 point ROS is below.  MEDICAL HISTORY:  Past Medical History:  Diagnosis Date   Alopecia 10/28/2016   Anginal pain (HCC)    Anxiety    Arthritis    Cancer (HCC)    Cervical spondylosis    C3-4 AND C4-5   Chest pain 07/28/2008   H/O, normal stress nuclear EF 78%   Chronic cough 07/12/2014   Collapsed lung 07/2010   being treated at [facility] - in left lung   Complication of anesthesia    Cough 07/30/2010   Qualifier: Diagnosis of  By: Marchelle Gearing MD, Murali     Cystocele 05/23/2015   Cystocele, midline 05/02/2013   DEEP VEIN THROMBOSIS/PHLEBITIS 07/30/2010   Qualifier: History of  By: Yancey Flemings CMA, Jennifer     Dislocation closed, shoulder 02/2013   DVT (deep venous thrombosis) (HCC) 2006   Encounter for therapeutic drug monitoring 10/03/2014   GERD (gastroesophageal reflux disease)    occ   Headache    hx   High cholesterol    History of recurrent UTIs 09/24/2011  HYPERLIPIDEMIA 07/30/2010   Qualifier: Diagnosis of  By: Yancey Flemings CMA, Jennifer     Hypertension    HYPERTENSION 07/30/2010   Qualifier: Diagnosis of  By: Yancey Flemings CMA, Jennifer     Hypothyroidism    Nasal itching 01/30/2014   Osteopenia 05/23/2015   Peripheral vascular disease (HCC) 2006   dvt's and 50 yrs ago   Pleuritic pain 12/17/2015   PONV (postoperative nausea and vomiting)    Primary localized osteoarthritis of right hip 08/26/2016   Primary  osteoarthritis of right hip 08/26/2016   Pulmonary collapse 07/30/2010   Qualifier: Diagnosis of  By: Marchelle Gearing MD, Murali     PULMONARY NODULE 07/30/2010   Qualifier: Diagnosis of  By: Marchelle Gearing MD, Murali     Rash and nonspecific skin eruption 01/02/2015   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   Sarcoidosis    Sarcoidosis of lung (HCC)    Vaginal atrophy 09/24/2011   Visual field defect nasal step 01/02/2015    SURGICAL HISTORY: Past Surgical History:  Procedure Laterality Date   BACK SURGERY  2022   EYE SURGERY     FOOT SURGERY Left 06/2005   bunion hammer toe   HEMORRHOID SURGERY  12/1972   INTRAOCULAR LENS INSERTION     right eye 10 /16 and left 06/16/2005   JOINT REPLACEMENT     KYPHOPLASTY N/A 06/27/2021   Procedure: LUMBAR 3 AND LUMBAR  5 KYPHOPLASTY;  Surgeon: Estill Bamberg, MD;  Location: [facility] OR;  Service: Orthopedics;  Laterality: N/A;   KYPHOPLASTY N/A 09/04/2021   Procedure: THORACIC SEVEN, THORACIC EIGHT, THORACIC NINE, THORACIC TEN KYPHOPLASTY;  Surgeon: Estill Bamberg, MD;  Location: [facility] OR;  Service: Orthopedics;  Laterality: N/A;   REVERSE SHOULDER ARTHROPLASTY Left 12/05/2022   Procedure: REVERSE SHOULDER ARTHROPLASTY;  Surgeon: Beverely Low, MD;  Location: [facility];  Service: Orthopedics;  Laterality: Left;  CHOICE WITH INTERSCALENE BLOCK   SKIN BIOPSY  01/2007   basal cell carcinoma removed from right lower leg    TOTAL HIP ARTHROPLASTY  06/2007   left   TOTAL HIP ARTHROPLASTY Right 08/26/2016   Procedure: TOTAL HIP ARTHROPLASTY ANTERIOR APPROACH;  Surgeon: Marcene Corning, MD;  Location: [facility] OR;  Service: Orthopedics;  Laterality: Right;   TOTAL KNEE ARTHROPLASTY Right 10/2007   right    SOCIAL HISTORY: Social History   Socioeconomic History   Marital status: Married    Spouse name: Not on file   Number of children: Not on file   Years of education: Not on file   Highest education level: Not on file  Occupational History   Occupation: retired     Associate Professor: RETIRED  Tobacco Use   Smoking status: Never   Smokeless tobacco: Never  Vaping Use   Vaping Use: Never used  Substance and Sexual Activity   Alcohol use: Not Currently    Comment: BEER/glass of wine A DAY   Drug use: No   Sexual activity: Never  Other Topics Concern   Not on file  Social History Narrative   Not on file   Social Determinants of Health   Financial Resource Strain: Not on file  Food Insecurity: No Food Insecurity (12/05/2022)   Hunger Vital Sign    Worried About Running Out of Food in the Last Year: Never true    Ran Out of Food in the Last Year: Never true  Transportation Needs: No Transportation Needs (12/05/2022)   PRAPARE - Transportation    Lack of Transportation (Medical): No    Lack of  Transportation (Non-Medical): No  Physical Activity: Not on file  Stress: Not on file  Social Connections: Not on file  Intimate Partner Violence: Not At Risk (12/05/2022)   Humiliation, Afraid, Rape, and Kick questionnaire    Fear of Current or Ex-Partner: No    Emotionally Abused: No    Physically Abused: No    Sexually Abused: No    FAMILY HISTORY: Family History  Problem Relation Age of Onset   Diabetes Mother    Heart failure Mother    Hypertension Father    Diabetes Father    Heart disease Father    Heart attack Father     ALLERGIES:  has No Known Allergies.  MEDICATIONS:  Current Outpatient Medications  Medication Sig Dispense Refill   ciprofloxacin (CIPRO) 500 MG tablet Take 500 mg by mouth 2 (two) times daily.     levothyroxine (SYNTHROID) 50 MCG tablet Take 50 mcg by mouth daily.     acyclovir (ZOVIRAX) 400 MG tablet TAKE ONE (1) TABLET BY MOUTH TWO (2) TIMES DAILY 60 tablet 2   amLODipine (NORVASC) 10 MG tablet Take 10 mg by mouth daily.     b complex vitamins capsule Take 1 capsule by mouth daily.     furosemide (LASIX) 20 MG tablet Take 20 mg by mouth daily as needed for edema.     gemfibrozil (LOPID) 600 MG tablet Take 600 mg by  mouth 2 (two) times daily before a meal.     HYDROcodone-acetaminophen (NORCO/VICODIN) 5-325 MG tablet TAKE 1 TABLET BY MOUTH EVERY 6 HOURS AS NEEDED FOR MODERATE PAIN 60 tablet 0   lidocaine (LIDODERM) 5 % Place 1 patch onto the skin daily. Remove & Discard patch within 12 hours or as directed by MD (Patient not taking: Reported on 01/15/2023) 30 patch 0   LORazepam (ATIVAN) 1 MG tablet Take 0.5 mg by mouth at bedtime as needed for anxiety.     losartan (COZAAR) 50 MG tablet Take 50 mg by mouth daily.     methocarbamol (ROBAXIN) 500 MG tablet Take 1 tablet (500 mg total) by mouth 2 (two) times daily. (Patient not taking: Reported on 01/15/2023) 20 tablet 0   Multiple Vitamin (MULTIVITAMIN) capsule Take 1 capsule by mouth daily.     naproxen sodium (ALEVE) 220 MG tablet Take 440 mg by mouth daily as needed (pain).     pantoprazole (PROTONIX) 20 MG tablet Take 40 mg by mouth daily at 12 noon.     polyethylene glycol powder (GLYCOLAX/MIRALAX) 17 GM/SCOOP powder Take 17 g by mouth at bedtime.     prednisoLONE acetate (PRED FORTE) 1 % ophthalmic suspension Place 1 drop into both eyes 4 (four) times daily.     pregabalin (LYRICA) 25 MG capsule TAKE ONE (1) CAPSULE BY MOUTH 2 TIMES DAILY 60 capsule 2   RESTASIS 0.05 % ophthalmic emulsion      trimethoprim (TRIMPEX) 100 MG tablet Take 100 mg by mouth daily.     No current facility-administered medications for this visit.    REVIEW OF SYSTEMS:   Constitutional: ( - ) fevers, ( - )  chills , ( - ) night sweats Eyes: ( - ) blurriness of vision, ( - ) double vision, ( - ) watery eyes Ears, nose, mouth, throat, and face: ( - ) mucositis, ( - ) sore throat Respiratory: ( - ) cough, ( - ) dyspnea, ( - ) wheezes Cardiovascular: ( - ) palpitation, ( - ) chest discomfort, ( - ) lower extremity  swelling Gastrointestinal:  ( - ) nausea, ( - ) heartburn, ( - ) change in bowel habits Skin: ( - ) abnormal skin rashes Lymphatics: ( - ) new lymphadenopathy, ( - ) easy  bruising Neurological: (+ ) numbness, ( +) tingling, ( - ) new weaknesses Behavioral/Psych: ( - ) mood change, ( - ) new changes  All other systems were reviewed with the patient and are negative.  PHYSICAL EXAMINATION: ECOG PERFORMANCE STATUS: 1 - Symptomatic but completely ambulatory  Vitals:   01/15/23 0950  BP: 134/65  Pulse: 81  Resp: 16  Temp: 97.9 F (36.6 C)  SpO2: 100%      Filed Weights   01/15/23 0950  Weight: 110 lb 14.4 oz (50.3 kg)     GENERAL: Well-appearing elderly Caucasian female, alert, no distress and comfortable SKIN: skin color, texture, turgor are normal, no rashes or significant lesions EYES: conjunctiva are pink and non-injected, sclera clear LUNGS: clear to auscultation and percussion with normal breathing effort HEART: regular rate & rhythm and no murmurs and no lower extremity edema Musculoskeletal: no cyanosis of digits and no clubbing  PSYCH: alert & oriented x 3, fluent speech NEURO: no focal motor/sensory deficits  LABORATORY DATA:  I have reviewed the data as listed    Latest Ref Rng & Units 01/15/2023    8:58 AM 12/06/2022    3:19 AM 11/21/2022    1:37 PM  CBC  WBC 4.0 - 10.5 K/uL 5.6   5.7   Hemoglobin 12.0 - 15.0 g/dL 16.1  9.3  09.6   Hematocrit 36.0 - 46.0 % 31.0  26.8  29.3   Platelets 150 - 400 K/uL 301   263        Latest Ref Rng & Units 01/15/2023    8:58 AM 12/06/2022    3:19 AM 11/21/2022    1:37 PM  CMP  Glucose 70 - 99 mg/dL 99  85  045   BUN 8 - 23 mg/dL 24  12  19    Creatinine 0.44 - 1.00 mg/dL 4.09  8.11  9.14   Sodium 135 - 145 mmol/L 137  137  133   Potassium 3.5 - 5.1 mmol/L 4.1  3.3  4.1   Chloride 98 - 111 mmol/L 104  107  102   CO2 22 - 32 mmol/L 22  19  24    Calcium 8.9 - 10.3 mg/dL 9.4  8.7  9.1   Total Protein 6.5 - 8.1 g/dL 7.5   7.0   Total Bilirubin 0.3 - 1.2 mg/dL 0.3   0.3   Alkaline Phos 38 - 126 U/L 112   86   AST 15 - 41 U/L 16   20   ALT 0 - 44 U/L 6   9     Lab Results  Component Value  Date   MPROTEIN 0.2 (H) 11/21/2022   MPROTEIN 0.2 (H) 10/24/2022   MPROTEIN 0.2 (H) 09/26/2022   Lab Results  Component Value Date   KPAFRELGTCHN 8.5 11/21/2022   KPAFRELGTCHN 7.5 10/24/2022   KPAFRELGTCHN 8.3 09/26/2022   LAMBDASER 7.3 11/21/2022   LAMBDASER 6.7 10/24/2022   LAMBDASER 7.1 09/26/2022   KAPLAMBRATIO 1.16 11/21/2022   KAPLAMBRATIO 1.12 10/24/2022   KAPLAMBRATIO 1.17 09/26/2022   RADIOGRAPHIC STUDIES: DG Shoulder Left  Result Date: 12/29/2022 CLINICAL DATA:  Postop 12/05/2022 reverse shoulder arthroplasty. Pain today. EXAM: LEFT SHOULDER - 2+ VIEW COMPARISON:  12/05/2022 FINDINGS: Similar appearance of left shoulder arthroplasty, without acute hardware complication. Visualized portion  of the left hemithorax is normal. Vertebral augmentation at multiple thoracic levels, incompletely imaged. IMPRESSION: Expected appearance after left shoulder arthroplasty. Electronically Signed   By: Jeronimo Greaves M.D.   On: 12/29/2022 13:45    ASSESSMENT & PLAN ADHARA CROTTY 89 y.o. female with medical history significant for newly diagnosed IgG lambda multiple myeloma who presents for a follow up visit.  #IgG Lambda Multiple Myeloma -- Diagnosis confirmed by bone marrow biopsy on 09/27/2021 which shows evidence of 20% involvement by plasma cells.  Additionally has anemia, macrocytosis, leukopenia, and concern for lytic lesions on bone imaging --Prognostic panel shows a deletion 17 with a TP53 mutation --Started Cycle 1 Day 1 of Velcade/Dex on 10/18/2021 --Switched to Dara/Dex on 03/14/2022 due to neuropathy secondary to Velcade.  Plan: --Due for Cycle 11 Day 1 of Dara/Dex today  --Labs from 11/21/2022 showed M protein 0.2, kappa and lambda light chains normal. Today's myeloma labs pending.  --Labs today show white blood cell count 5.6, Hgb 10.4, MCV 94.5, Plt 301 --if Hgb remains consistently >10 can consider the addition of revlimid.  --RTC in 4 weeks with labs, follow up visit and  Dara/Dex treatments.   # H/O Confusion --decreased dexamethasone to 20 mg on infusion days.  --Symptoms resolved.   #Leukopenia/Macrocytic anemia/Vitamin B12 deficiency: --Secondary to chemotherapy and multiple myeloma --Received vitamin B12 injection 1000 mcg weekly x 4 doses. Continue q 4 weeks.  -- continue p.o. supplementation.  #Right rib pain, improved: --CT chest from 10/25/2021 that didn't show any new or progressive disease.   #Abdominal cramping--resolved: --Symptoms occur after velcade injection.  --Has Bentyl as needed  #Neuropathy: --Grade 2 involving finger tips and feet --Secondary to velcade  --Currently on Lyrica 25 mg PO BID.   #Supportive Care -- chemotherapy education complete -- port placement not required.  -- zofran 8mg  q8H PRN and compazine 10mg  PO q6H for nausea -- acyclovir 400mg  PO BID for VCZ prophylaxis. Patient was not taking medication as prescribed.  -- Await dental clearance to start Zometa  Orders Placed This Encounter  Procedures   CMP (Cancer Center only)    Standing Status:   Future    Standing Expiration Date:   04/08/2024   Multiple Myeloma Panel (SPEP&IFE w/QIG)    Standing Status:   Future    Standing Expiration Date:   04/08/2024   Lactate dehydrogenase (LDH)    Standing Status:   Future    Standing Expiration Date:   04/08/2024   Kappa/lambda light chains    Standing Status:   Future    Standing Expiration Date:   04/08/2024   CBC with Differential (Cancer Center Only)    Standing Status:   Future    Standing Expiration Date:   04/08/2024   CMP (Cancer Center only)    Standing Status:   Future    Standing Expiration Date:   05/06/2024   Multiple Myeloma Panel (SPEP&IFE w/QIG)    Standing Status:   Future    Standing Expiration Date:   05/06/2024   Lactate dehydrogenase (LDH)    Standing Status:   Future    Standing Expiration Date:   05/06/2024   Kappa/lambda light chains    Standing Status:   Future    Standing Expiration  Date:   05/06/2024   CBC with Differential (Cancer Center Only)    Standing Status:   Future    Standing Expiration Date:   05/06/2024   All questions were answered. The patient knows to call the  clinic with any problems, questions or concerns.  I have spent a total of 30 minutes minutes of face-to-face and non-face-to-face time, preparing to see the patient, performing a medically appropriate examination, counseling and educating the patient, ordering medications, documenting clinical information in the electronic health record,  and care coordination.   Ulysees Barns, MD Department of Hematology/Oncology [facility] Phone: [phone] Pager: [phone] Email: [email or web address]   01/15/2023 10:39 AM

## 2023-01-15 NOTE — Patient Instructions (Signed)
[facility] [facility]  Discharge Instructions: Thank you for choosing [facility] to provide your oncology and hematology care.   If you have a lab appointment with the Cancer Center, please go directly to the Cancer Center and check in at the registration area.   Wear comfortable clothing and clothing appropriate for easy access to any Portacath or PICC line.   We strive to give you quality time with your provider. You may need to reschedule your appointment if you arrive late (15 or more minutes).  Arriving late affects you and other patients whose appointments are after yours.  Also, if you miss three or more appointments without notifying the office, you may be dismissed from the clinic at the provider's discretion.      For prescription refill requests, have your pharmacy contact our office and allow 72 hours for refills to be completed.    Today you received the following chemotherapy and/or immunotherapy agents: Dara Faspro.    To help prevent nausea and vomiting after your treatment, we encourage you to take your nausea medication as directed.  BELOW ARE SYMPTOMS THAT SHOULD BE REPORTED IMMEDIATELY: *FEVER GREATER THAN 100.4 F (38 C) OR HIGHER *CHILLS OR SWEATING *NAUSEA AND VOMITING THAT IS NOT CONTROLLED WITH YOUR NAUSEA MEDICATION *UNUSUAL SHORTNESS OF BREATH *UNUSUAL BRUISING OR BLEEDING *URINARY PROBLEMS (pain or burning when urinating, or frequent urination) *BOWEL PROBLEMS (unusual diarrhea, constipation, pain near the anus) TENDERNESS IN MOUTH AND THROAT WITH OR WITHOUT PRESENCE OF ULCERS (sore throat, sores in mouth, or a toothache) UNUSUAL RASH, SWELLING OR PAIN  UNUSUAL VAGINAL DISCHARGE OR ITCHING   Items with * indicate a potential emergency and should be followed up as soon as possible or go to the Emergency Department if any problems should occur.  Please show the CHEMOTHERAPY ALERT CARD or IMMUNOTHERAPY ALERT CARD at  check-in to the Emergency Department and triage nurse.  Should you have questions after your visit or need to cancel or reschedule your appointment, please contact [facility] [facility]  Dept: [phone]  and follow the prompts.  Office hours are 8:00 a.m. to 4:30 p.m. Monday - Friday. Please note that voicemails left after 4:00 p.m. may not be returned until the following business day.  We are closed weekends and major holidays. You have access to a nurse at all times for urgent questions. Please call the main number to the clinic Dept: [phone] and follow the prompts.   For any non-urgent questions, you may also contact your provider using MyChart. We now offer e-Visits for anyone 18 and older to request care online for non-urgent symptoms. For details visit [email or web address].   Also download the MyChart app! Go to the app store, search "MyChart", open the app, select [place] [facility], and log in with your MyChart username and password.   

## 2023-01-16 ENCOUNTER — Other Ambulatory Visit: Payer: Self-pay

## 2023-01-16 LAB — KAPPA/LAMBDA LIGHT CHAINS
Kappa free light chain: 8.5 mg/L (ref 3.3–19.4)
Kappa, lambda light chain ratio: 0.89 (ref 0.26–1.65)
Lambda free light chains: 9.6 mg/L (ref 5.7–26.3)

## 2023-01-20 DIAGNOSIS — Z4789 Encounter for other orthopedic aftercare: Secondary | ICD-10-CM | POA: Diagnosis not present

## 2023-01-26 LAB — MULTIPLE MYELOMA PANEL, SERUM
Albumin SerPl Elph-Mcnc: 3.4 g/dL (ref 2.9–4.4)
Albumin/Glob SerPl: 1.1 (ref 0.7–1.7)
Alpha 1: 0.4 g/dL (ref 0.0–0.4)
Alpha2 Glob SerPl Elph-Mcnc: 1 g/dL (ref 0.4–1.0)
B-Globulin SerPl Elph-Mcnc: 0.9 g/dL (ref 0.7–1.3)
Gamma Glob SerPl Elph-Mcnc: 0.8 g/dL (ref 0.4–1.8)
Globulin, Total: 3.2 g/dL (ref 2.2–3.9)
IgA: 14 mg/dL — ABNORMAL LOW (ref 64–422)
IgG (Immunoglobin G), Serum: 736 mg/dL (ref 586–1602)
IgM (Immunoglobulin M), Srm: 20 mg/dL — ABNORMAL LOW (ref 26–217)
M Protein SerPl Elph-Mcnc: 0.4 g/dL — ABNORMAL HIGH
Total Protein ELP: 6.6 g/dL (ref 6.0–8.5)

## 2023-01-28 DIAGNOSIS — N390 Urinary tract infection, site not specified: Secondary | ICD-10-CM | POA: Diagnosis not present

## 2023-01-29 DIAGNOSIS — N390 Urinary tract infection, site not specified: Secondary | ICD-10-CM | POA: Diagnosis not present

## 2023-01-29 DIAGNOSIS — B9620 Unspecified Escherichia coli [E. coli] as the cause of diseases classified elsewhere: Secondary | ICD-10-CM | POA: Diagnosis not present

## 2023-01-29 DIAGNOSIS — R350 Frequency of micturition: Secondary | ICD-10-CM | POA: Diagnosis not present

## 2023-02-03 DIAGNOSIS — R2689 Other abnormalities of gait and mobility: Secondary | ICD-10-CM | POA: Diagnosis not present

## 2023-02-03 DIAGNOSIS — M6281 Muscle weakness (generalized): Secondary | ICD-10-CM | POA: Diagnosis not present

## 2023-02-03 DIAGNOSIS — Z4789 Encounter for other orthopedic aftercare: Secondary | ICD-10-CM | POA: Diagnosis not present

## 2023-02-03 DIAGNOSIS — M25512 Pain in left shoulder: Secondary | ICD-10-CM | POA: Diagnosis not present

## 2023-02-10 ENCOUNTER — Other Ambulatory Visit: Payer: Self-pay

## 2023-02-11 DIAGNOSIS — Z4789 Encounter for other orthopedic aftercare: Secondary | ICD-10-CM | POA: Diagnosis not present

## 2023-02-11 DIAGNOSIS — R2689 Other abnormalities of gait and mobility: Secondary | ICD-10-CM | POA: Diagnosis not present

## 2023-02-11 DIAGNOSIS — M25512 Pain in left shoulder: Secondary | ICD-10-CM | POA: Diagnosis not present

## 2023-02-11 DIAGNOSIS — M6281 Muscle weakness (generalized): Secondary | ICD-10-CM | POA: Diagnosis not present

## 2023-02-13 ENCOUNTER — Inpatient Hospital Stay: Payer: Medicare PPO

## 2023-02-13 ENCOUNTER — Inpatient Hospital Stay: Payer: Medicare PPO | Admitting: Physician Assistant

## 2023-02-13 ENCOUNTER — Other Ambulatory Visit: Payer: Self-pay

## 2023-02-13 ENCOUNTER — Other Ambulatory Visit: Payer: Self-pay | Admitting: *Deleted

## 2023-02-13 ENCOUNTER — Inpatient Hospital Stay: Payer: Medicare PPO | Attending: Hematology and Oncology

## 2023-02-13 VITALS — BP 139/62 | HR 62 | Temp 97.9°F | Resp 16 | Ht 59.0 in | Wt 109.0 lb

## 2023-02-13 DIAGNOSIS — Z79899 Other long term (current) drug therapy: Secondary | ICD-10-CM | POA: Diagnosis not present

## 2023-02-13 DIAGNOSIS — Z5112 Encounter for antineoplastic immunotherapy: Secondary | ICD-10-CM | POA: Diagnosis not present

## 2023-02-13 DIAGNOSIS — E538 Deficiency of other specified B group vitamins: Secondary | ICD-10-CM | POA: Diagnosis not present

## 2023-02-13 DIAGNOSIS — C9000 Multiple myeloma not having achieved remission: Secondary | ICD-10-CM

## 2023-02-13 DIAGNOSIS — Z800 Family history of malignant neoplasm of digestive organs: Secondary | ICD-10-CM | POA: Insufficient documentation

## 2023-02-13 DIAGNOSIS — R5383 Other fatigue: Secondary | ICD-10-CM | POA: Insufficient documentation

## 2023-02-13 DIAGNOSIS — Z79620 Long term (current) use of immunosuppressive biologic: Secondary | ICD-10-CM | POA: Diagnosis not present

## 2023-02-13 LAB — CMP (CANCER CENTER ONLY)
ALT: 7 U/L (ref 0–44)
AST: 16 U/L (ref 15–41)
Albumin: 3.9 g/dL (ref 3.5–5.0)
Alkaline Phosphatase: 108 U/L (ref 38–126)
Anion gap: 10 (ref 5–15)
BUN: 15 mg/dL (ref 8–23)
CO2: 22 mmol/L (ref 22–32)
Calcium: 9.3 mg/dL (ref 8.9–10.3)
Chloride: 104 mmol/L (ref 98–111)
Creatinine: 0.78 mg/dL (ref 0.44–1.00)
GFR, Estimated: >60 mL/min (ref >60)
Glucose, Bld: 86 mg/dL (ref 70–99)
Potassium: 3.9 mmol/L (ref 3.5–5.1)
Sodium: 136 mmol/L (ref 135–145)
Total Bilirubin: 0.4 mg/dL (ref 0.3–1.2)
Total Protein: 6.5 g/dL (ref 6.5–8.1)

## 2023-02-13 LAB — CBC WITH DIFFERENTIAL (CANCER CENTER ONLY)
Abs Immature Granulocytes: 0 10*3/uL (ref 0.00–0.07)
Basophils Absolute: 0 10*3/uL (ref 0.0–0.1)
Basophils Relative: 1 %
Eosinophils Absolute: 0.1 10*3/uL (ref 0.0–0.5)
Eosinophils Relative: 4 %
HCT: 30.9 % — ABNORMAL LOW (ref 36.0–46.0)
Hemoglobin: 10.4 g/dL — ABNORMAL LOW (ref 12.0–15.0)
Immature Granulocytes: 0 %
Lymphocytes Relative: 36 %
Lymphs Abs: 1 10*3/uL (ref 0.7–4.0)
MCH: 31 pg (ref 26.0–34.0)
MCHC: 33.7 g/dL (ref 30.0–36.0)
MCV: 92 fL (ref 80.0–100.0)
Monocytes Absolute: 0.5 10*3/uL (ref 0.1–1.0)
Monocytes Relative: 19 %
Neutro Abs: 1.1 10*3/uL — ABNORMAL LOW (ref 1.7–7.7)
Neutrophils Relative %: 40 %
Platelet Count: 261 10*3/uL (ref 150–400)
RBC: 3.36 MIL/uL — ABNORMAL LOW (ref 3.87–5.11)
RDW: 13.3 % (ref 11.5–15.5)
WBC Count: 2.8 10*3/uL — ABNORMAL LOW (ref 4.0–10.5)
nRBC: 0 % (ref 0.0–0.2)

## 2023-02-13 LAB — LACTATE DEHYDROGENASE: LDH: 145 U/L (ref 98–192)

## 2023-02-13 MED ORDER — CYANOCOBALAMIN 1000 MCG/ML IJ SOLN
1000.0000 ug | Freq: Once | INTRAMUSCULAR | Status: AC
  Administered 2023-02-13: 1000 ug via INTRAMUSCULAR
  Filled 2023-02-13: qty 1

## 2023-02-13 MED ORDER — DIPHENHYDRAMINE HCL 25 MG PO CAPS
50.0000 mg | ORAL_CAPSULE | Freq: Once | ORAL | Status: AC
  Administered 2023-02-13: 50 mg via ORAL
  Filled 2023-02-13: qty 2

## 2023-02-13 MED ORDER — DEXAMETHASONE 4 MG PO TABS
20.0000 mg | ORAL_TABLET | Freq: Once | ORAL | Status: AC
  Administered 2023-02-13: 20 mg via ORAL
  Filled 2023-02-13: qty 5

## 2023-02-13 MED ORDER — ACETAMINOPHEN 325 MG PO TABS
650.0000 mg | ORAL_TABLET | Freq: Once | ORAL | Status: AC
  Administered 2023-02-13: 650 mg via ORAL
  Filled 2023-02-13: qty 2

## 2023-02-13 MED ORDER — DARATUMUMAB-HYALURONIDASE-FIHJ 1800-30000 MG-UT/15ML ~~LOC~~ SOLN
1800.0000 mg | Freq: Once | SUBCUTANEOUS | Status: AC
  Administered 2023-02-13: 1800 mg via SUBCUTANEOUS
  Filled 2023-02-13: qty 15

## 2023-02-13 NOTE — Patient Instructions (Signed)
[facility] [facility]  Discharge Instructions: Thank you for choosing [facility] to provide your oncology and hematology care.   If you have a lab appointment with the Cancer Center, please go directly to the Cancer Center and check in at the registration area.   Wear comfortable clothing and clothing appropriate for easy access to any Portacath or PICC line.   We strive to give you quality time with your provider. You may need to reschedule your appointment if you arrive late (15 or more minutes).  Arriving late affects you and other patients whose appointments are after yours.  Also, if you miss three or more appointments without notifying the office, you may be dismissed from the clinic at the provider's discretion.      For prescription refill requests, have your pharmacy contact our office and allow 72 hours for refills to be completed.    Today you received the following chemotherapy and/or immunotherapy agents Darzalex Faspro      To help prevent nausea and vomiting after your treatment, we encourage you to take your nausea medication as directed.  BELOW ARE SYMPTOMS THAT SHOULD BE REPORTED IMMEDIATELY: *FEVER GREATER THAN 100.4 F (38 C) OR HIGHER *CHILLS OR SWEATING *NAUSEA AND VOMITING THAT IS NOT CONTROLLED WITH YOUR NAUSEA MEDICATION *UNUSUAL SHORTNESS OF BREATH *UNUSUAL BRUISING OR BLEEDING *URINARY PROBLEMS (pain or burning when urinating, or frequent urination) *BOWEL PROBLEMS (unusual diarrhea, constipation, pain near the anus) TENDERNESS IN MOUTH AND THROAT WITH OR WITHOUT PRESENCE OF ULCERS (sore throat, sores in mouth, or a toothache) UNUSUAL RASH, SWELLING OR PAIN  UNUSUAL VAGINAL DISCHARGE OR ITCHING   Items with * indicate a potential emergency and should be followed up as soon as possible or go to the Emergency Department if any problems should occur.  Please show the CHEMOTHERAPY ALERT CARD or IMMUNOTHERAPY ALERT CARD at  check-in to the Emergency Department and triage nurse.  Should you have questions after your visit or need to cancel or reschedule your appointment, please contact [facility]  Dept: [phone]  and follow the prompts.  Office hours are 8:00 a.m. to 4:30 p.m. Monday - Friday. Please note that voicemails left after 4:00 p.m. may not be returned until the following business day.  We are closed weekends and major holidays. You have access to a nurse at all times for urgent questions. Please call the main number to the clinic Dept: [phone] and follow the prompts.   For any non-urgent questions, you may also contact your provider using MyChart. We now offer e-Visits for anyone 18 and older to request care online for non-urgent symptoms. For details visit [email or web address].   Also download the MyChart app! Go to the app store, search "MyChart", open the app, select [facility], and log in with your MyChart username and password.  

## 2023-02-13 NOTE — Progress Notes (Signed)
Per Earlie Counts, ok to tx with ANC 1.1

## 2023-02-13 NOTE — Progress Notes (Signed)
[facility] [phone]) [phone]   [phone]) [phone]  PROGRESS NOTE  Patient Care Team: Geoffry Paradise, MD as PCP - General (Internal Medicine)  Hematological/Oncological History # IgG Lambda Multiple Myeloma 06/04/2021: WBC 5.5, Hgb 11.1, MCV 101.3, Plt 306 07/24/2021: M protein 2.3, IFE shows IgG Lambda monoclonal specificity. Cr 0.9 08/07/2021: Establish care with Dr. Leonides Schanz 09/27/2021: Bmbx showed hypercellular bone marrow with plasma cell neoplasm, increased number of atypical plasma cells averaging 20% of all cells in the aspirate 10/18/2021: Cycle 1 of Velcade/Dexamethasone 11/08/2021: Cycle 2 of Velcade/Dexamethasone 11/29/2021: Cycle 3 of Velcade/Dexamethasone 12/20/2021: Cycle 4 of Velcade/Dexamethasone 01/10/2022: Cycle 5 of Velcade/Dexamethasone 01/31/2022: Cycle 6 of Velcade/Dexamethasone 02/28/2022: Cycle 7 of Velcade/Dexamethasone. Dose reduced velcade due to neuropathy  03/14/2022: Cycle 1 Day 1 of Dara/Dex (velcade d/c due to neuropathy).  04/11/2022: Cycle 2 Day 1 of Dara/Dex 05/09/2022: Cycle 3 Day 1 of Dara/Dex 06/06/2022: Cycle 4 Day 1 of Dara/Dex 07/04/2022: Cycle 5 Day 1 of Dara/Dex 08/01/2022: Cycle 6 Day 1 of Dara/Dex 08/29/2022: Cycle 7 Day 1 of Dara/Dex 09/26/2022: Cycle 8 Day 1 of Dara/Dex 10/24/2022: Cycle 9 Day 1 of Dara/Dex 11/21/2022: Cycle 10 Day 1 of Dara/Dex 01/15/2023: Cycle 11 Day 1 of Dara/Dex 02/13/2023: Cycle 12 Day 1 of Dara/Dex  Interval History:  [name] Page 89 y.o. female with medical history significant for  IgG lambda multiple myeloma who presents for a follow up visit. The patient's last visit was on 01/15/2023. In the interim, patient has had no major changes in her health. She is due for Cycle 12, Day 1.  On exam today [name] Page reports that she is tolerating treatment without any new or concerning symptoms. She reports her energy levels are stable. She has fatigue which requires her to rest during the day.Marland Kitchen Her husband and  her live in a retirement facility that offers meals during the day. She denies nausea, vomiting or abdominal pain. She is now on prophylatic macrodantin daily due to recurrent UTIs. She denies bowel habit changes including recurrent episodes of diarrhea or constipation. She denies any overt signs of bleeding including hematochezia or melena. She reports stable neuropathy in her hands.  Overall she is willing and able to proceed with chemotherapy treatment at this time.  She denies fevers, chills, night sweats, shortness of breath, chest pain or cough.  She has no other complaints. Rest of the 10 point ROS is below.  MEDICAL HISTORY:  Past Medical History:  Diagnosis Date   Alopecia 10/28/2016   Anginal pain (HCC)    Anxiety    Arthritis    Cancer (HCC)    Cervical spondylosis    C3-4 AND C4-5   Chest pain 07/28/2008   H/O, normal stress nuclear EF 78%   Chronic cough 07/12/2014   Collapsed lung 07/2010   being treated at [facility] - in left lung   Complication of anesthesia    Cough 07/30/2010   Qualifier: Diagnosis of  By: Marchelle Gearing MD, Murali     Cystocele 05/23/2015   Cystocele, midline 05/02/2013   DEEP VEIN THROMBOSIS/PHLEBITIS 07/30/2010   Qualifier: History of  By: Yancey Flemings CMA, Jennifer     Dislocation closed, shoulder 02/2013   DVT (deep venous thrombosis) (HCC) 2006   Encounter for therapeutic drug monitoring 10/03/2014   GERD (gastroesophageal reflux disease)    occ   Headache    hx   High cholesterol    History of recurrent UTIs 09/24/2011   HYPERLIPIDEMIA 07/30/2010   Qualifier: Diagnosis of  By: Yancey Flemings CMA, Jennifer     Hypertension    HYPERTENSION 07/30/2010   Qualifier: Diagnosis of  By: Yancey Flemings CMA, Jennifer     Hypothyroidism    Nasal itching 01/30/2014   Osteopenia 05/23/2015   Peripheral vascular disease (HCC) 2006   dvt's and 50 yrs ago   Pleuritic pain 12/17/2015   PONV (postoperative nausea and vomiting)    Primary localized osteoarthritis of right  hip 08/26/2016   Primary osteoarthritis of right hip 08/26/2016   Pulmonary collapse 07/30/2010   Qualifier: Diagnosis of  By: Marchelle Gearing MD, Murali     PULMONARY NODULE 07/30/2010   Qualifier: Diagnosis of  By: Marchelle Gearing MD, Murali     Rash and nonspecific skin eruption 01/02/2015   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   Sarcoidosis    Sarcoidosis of lung (HCC)    Vaginal atrophy 09/24/2011   Visual field defect nasal step 01/02/2015    SURGICAL HISTORY: Past Surgical History:  Procedure Laterality Date   BACK SURGERY  2022   EYE SURGERY     FOOT SURGERY Left 06/2005   bunion hammer toe   HEMORRHOID SURGERY  12/1972   INTRAOCULAR LENS INSERTION     right eye 10 /16 and left 06/16/2005   JOINT REPLACEMENT     KYPHOPLASTY N/A 06/27/2021   Procedure: LUMBAR 3 AND LUMBAR  5 KYPHOPLASTY;  Surgeon: Estill Bamberg, MD;  Location: [facility] OR;  Service: Orthopedics;  Laterality: N/A;   KYPHOPLASTY N/A 09/04/2021   Procedure: THORACIC SEVEN, THORACIC EIGHT, THORACIC NINE, THORACIC TEN KYPHOPLASTY;  Surgeon: Estill Bamberg, MD;  Location: [facility] OR;  Service: Orthopedics;  Laterality: N/A;   REVERSE SHOULDER ARTHROPLASTY Left 12/05/2022   Procedure: REVERSE SHOULDER ARTHROPLASTY;  Surgeon: Beverely Low, MD;  Location: [facility] ORS;  Service: Orthopedics;  Laterality: Left;  CHOICE WITH INTERSCALENE BLOCK   SKIN BIOPSY  01/2007   basal cell carcinoma removed from right lower leg    TOTAL HIP ARTHROPLASTY  06/2007   left   TOTAL HIP ARTHROPLASTY Right 08/26/2016   Procedure: TOTAL HIP ARTHROPLASTY ANTERIOR APPROACH;  Surgeon: Marcene Corning, MD;  Location: [facility] OR;  Service: Orthopedics;  Laterality: Right;   TOTAL KNEE ARTHROPLASTY Right 10/2007   right    SOCIAL HISTORY: Social History   Socioeconomic History   Marital status: Married    Spouse name: Not on file   Number of children: Not on file   Years of education: Not on file   Highest education level: Not on file  Occupational History    Occupation: retired    Associate Professor: RETIRED  Tobacco Use   Smoking status: Never   Smokeless tobacco: Never  Vaping Use   Vaping Use: Never used  Substance and Sexual Activity   Alcohol use: Not Currently    Comment: BEER/glass of wine A DAY   Drug use: No   Sexual activity: Never  Other Topics Concern   Not on file  Social History Narrative   Not on file   Social Determinants of Health   Financial Resource Strain: Not on file  Food Insecurity: No Food Insecurity (12/05/2022)   Hunger Vital Sign    Worried About Running Out of Food in the Last Year: Never true    Ran Out of Food in the Last Year: Never true  Transportation Needs: No Transportation Needs (12/05/2022)   PRAPARE - Administrator, Civil Service (Medical): No    Lack of Transportation (Non-Medical): No  Physical Activity: Not on  file  Stress: Not on file  Social Connections: Not on file  Intimate Partner Violence: Not At Risk (12/05/2022)   Humiliation, Afraid, Rape, and Kick questionnaire    Fear of Current or Ex-Partner: No    Emotionally Abused: No    Physically Abused: No    Sexually Abused: No    FAMILY HISTORY: Family History  Problem Relation Age of Onset   Diabetes Mother    Heart failure Mother    Hypertension Father    Diabetes Father    Heart disease Father    Heart attack Father     ALLERGIES:  has No Known Allergies.  MEDICATIONS:  Current Outpatient Medications  Medication Sig Dispense Refill   acyclovir (ZOVIRAX) 400 MG tablet TAKE ONE (1) TABLET BY MOUTH TWO (2) TIMES DAILY 60 tablet 2   amLODipine (NORVASC) 10 MG tablet Take 10 mg by mouth daily.     b complex vitamins capsule Take 1 capsule by mouth daily.     furosemide (LASIX) 20 MG tablet Take 20 mg by mouth daily as needed for edema.     gemfibrozil (LOPID) 600 MG tablet Take 600 mg by mouth 2 (two) times daily before a meal.     HYDROcodone-acetaminophen (NORCO/VICODIN) 5-325 MG tablet TAKE 1 TABLET BY MOUTH EVERY 6  HOURS AS NEEDED FOR MODERATE PAIN 60 tablet 0   levothyroxine (SYNTHROID) 50 MCG tablet Take 50 mcg by mouth daily.     LORazepam (ATIVAN) 1 MG tablet Take 0.5 mg by mouth at bedtime as needed for anxiety.     losartan (COZAAR) 50 MG tablet Take 50 mg by mouth daily.     Multiple Vitamin (MULTIVITAMIN) capsule Take 1 capsule by mouth daily.     naproxen sodium (ALEVE) 220 MG tablet Take 440 mg by mouth daily as needed (pain).     nitrofurantoin (MACRODANTIN) 100 MG capsule daily.     pantoprazole (PROTONIX) 20 MG tablet Take 40 mg by mouth daily at 12 noon.     polyethylene glycol powder (GLYCOLAX/MIRALAX) 17 GM/SCOOP powder Take 17 g by mouth at bedtime.     pregabalin (LYRICA) 25 MG capsule TAKE ONE (1) CAPSULE BY MOUTH 2 TIMES DAILY 60 capsule 2   trimethoprim (TRIMPEX) 100 MG tablet Take 100 mg by mouth daily.     ciprofloxacin (CIPRO) 500 MG tablet Take 500 mg by mouth 2 (two) times daily. (Patient not taking: Reported on 02/13/2023)     lidocaine (LIDODERM) 5 % Place 1 patch onto the skin daily. Remove & Discard patch within 12 hours or as directed by MD (Patient not taking: Reported on 01/15/2023) 30 patch 0   methocarbamol (ROBAXIN) 500 MG tablet Take 1 tablet (500 mg total) by mouth 2 (two) times daily. (Patient not taking: Reported on 01/15/2023) 20 tablet 0   prednisoLONE acetate (PRED FORTE) 1 % ophthalmic suspension Place 1 drop into both eyes 4 (four) times daily. (Patient not taking: Reported on 02/13/2023)     RESTASIS 0.05 % ophthalmic emulsion  (Patient not taking: Reported on 02/13/2023)     No current facility-administered medications for this visit.    REVIEW OF SYSTEMS:   Constitutional: ( - ) fevers, ( - )  chills , ( - ) night sweats Eyes: ( - ) blurriness of vision, ( - ) double vision, ( - ) watery eyes Ears, nose, mouth, throat, and face: ( - ) mucositis, ( - ) sore throat Respiratory: ( - ) cough, ( - )  dyspnea, ( - ) wheezes Cardiovascular: ( - ) palpitation, ( - ) chest  discomfort, ( - ) lower extremity swelling Gastrointestinal:  ( - ) nausea, ( - ) heartburn, ( - ) change in bowel habits Skin: ( - ) abnormal skin rashes Lymphatics: ( - ) new lymphadenopathy, ( - ) easy bruising Neurological: (+ ) numbness, ( +) tingling, ( - ) new weaknesses Behavioral/Psych: ( - ) mood change, ( - ) new changes  All other systems were reviewed with the patient and are negative.  PHYSICAL EXAMINATION: ECOG PERFORMANCE STATUS: 1 - Symptomatic but completely ambulatory  Vitals:   02/13/23 1000  BP: 139/62  Pulse: 62  Resp: 16  Temp: 97.9 F (36.6 C)  SpO2: 99%      Filed Weights   02/13/23 1000  Weight: 109 lb (49.4 kg)     GENERAL: Well-appearing elderly Caucasian female, alert, no distress and comfortable SKIN: skin color, texture, turgor are normal, no rashes or significant lesions EYES: conjunctiva are pink and non-injected, sclera clear LUNGS: clear to auscultation and percussion with normal breathing effort HEART: regular rate & rhythm and no murmurs and no lower extremity edema Musculoskeletal: no cyanosis of digits and no clubbing  PSYCH: alert & oriented x 3, fluent speech NEURO: no focal motor/sensory deficits  LABORATORY DATA:  I have reviewed the data as listed    Latest Ref Rng & Units 02/13/2023    8:58 AM 01/15/2023    8:58 AM 12/06/2022    3:19 AM  CBC  WBC 4.0 - 10.5 K/uL 2.8  5.6    Hemoglobin 12.0 - 15.0 g/dL 40.9  81.1  9.3   Hematocrit 36.0 - 46.0 % 30.9  31.0  26.8   Platelets 150 - 400 K/uL 261  301         Latest Ref Rng & Units 02/13/2023    8:58 AM 01/15/2023    8:58 AM 12/06/2022    3:19 AM  CMP  Glucose 70 - 99 mg/dL 86  99  85   BUN 8 - 23 mg/dL 15  24  12    Creatinine 0.44 - 1.00 mg/dL 9.14  7.82  9.56   Sodium 135 - 145 mmol/L 136  137  137   Potassium 3.5 - 5.1 mmol/L 3.9  4.1  3.3   Chloride 98 - 111 mmol/L 104  104  107   CO2 22 - 32 mmol/L 22  22  19    Calcium 8.9 - 10.3 mg/dL 9.3  9.4  8.7   Total Protein  6.5 - 8.1 g/dL 6.5  7.5    Total Bilirubin 0.3 - 1.2 mg/dL 0.4  0.3    Alkaline Phos 38 - 126 U/L 108  112    AST 15 - 41 U/L 16  16    ALT 0 - 44 U/L 7  6      Lab Results  Component Value Date   MPROTEIN 0.4 (H) 01/15/2023   MPROTEIN 0.2 (H) 11/21/2022   MPROTEIN 0.2 (H) 10/24/2022   Lab Results  Component Value Date   KPAFRELGTCHN 8.5 01/15/2023   KPAFRELGTCHN 8.5 11/21/2022   KPAFRELGTCHN 7.5 10/24/2022   LAMBDASER 9.6 01/15/2023   LAMBDASER 7.3 11/21/2022   LAMBDASER 6.7 10/24/2022   KAPLAMBRATIO 0.89 01/15/2023   KAPLAMBRATIO 1.16 11/21/2022   KAPLAMBRATIO 1.12 10/24/2022   RADIOGRAPHIC STUDIES: No results found.  ASSESSMENT & PLAN DAKIYAH SHEFFIELD 89 y.o. female with medical history significant for newly diagnosed IgG  lambda multiple myeloma who presents for a follow up visit.  #IgG Lambda Multiple Myeloma -- Diagnosis confirmed by bone marrow biopsy on 09/27/2021 which shows evidence of 20% involvement by plasma cells.  Additionally has anemia, macrocytosis, leukopenia, and concern for lytic lesions on bone imaging --Prognostic panel shows a deletion 17 with a TP53 mutation --Started Cycle 1 Day 1 of Velcade/Dex on 10/18/2021 --Switched to Dara/Dex on 03/14/2022 due to neuropathy secondary to Velcade.  Plan: --Due for Cycle 12 Day 1 of Dara/Dex today  --Labs from 01/15/2023 showed M protein 0.4, kappa and lambda light chains normal. Today's myeloma labs pending.  --Labs today show white blood cell count 2.8 (could be secondary to abx), Hgb 10.4, MCV 92.0, Plt 261, Creatinine and LFTs normal.  --if Hgb remains consistently >10 can consider the addition of revlimid.  --RTC in 4 weeks with labs, follow up visit and Dara/Dex treatments.   # H/O Confusion --decreased dexamethasone to 20 mg on infusion days.  --Symptoms resolved.   #Leukopenia/Macrocytic anemia/Vitamin B12 deficiency: --Secondary to chemotherapy and multiple myeloma --Received vitamin B12 injection 1000  mcg weekly x 4 doses. Continue q 4 weeks.  -- continue p.o. supplementation.  #Right rib pain, improved: --CT chest from 10/25/2021 that didn't show any new or progressive disease.   #Abdominal cramping--resolved: --Symptoms occur after velcade injection.  --Has Bentyl as needed  #Neuropathy: --Grade 2 involving finger tips and feet --Secondary to velcade  --Currently on Lyrica 25 mg PO BID.   #Supportive Care -- chemotherapy education complete -- port placement not required.  -- zofran 8mg  q8H PRN and compazine 10mg  PO q6H for nausea -- acyclovir 400mg  PO BID for VCZ prophylaxis. Patient was not taking medication as prescribed.  -- Await dental clearance to start Zometa  No orders of the defined types were placed in this encounter.  All questions were answered. The patient knows to call the clinic with any problems, questions or concerns.  I have spent a total of 30 minutes minutes of face-to-face and non-face-to-face time, preparing to see the patient, performing a medically appropriate examination, counseling and educating the patient, ordering medications, documenting clinical information in the electronic health record,  and care coordination.   Georga Kaufmann PA-C Dept of Hematology and Oncology [facility] Phone: [phone]    02/13/2023 10:28 AM

## 2023-02-14 ENCOUNTER — Other Ambulatory Visit: Payer: Self-pay

## 2023-02-16 LAB — KAPPA/LAMBDA LIGHT CHAINS
Kappa free light chain: 6.5 mg/L (ref 3.3–19.4)
Kappa, lambda light chain ratio: 0.87 (ref 0.26–1.65)
Lambda free light chains: 7.5 mg/L (ref 5.7–26.3)

## 2023-02-18 DIAGNOSIS — Z4789 Encounter for other orthopedic aftercare: Secondary | ICD-10-CM | POA: Diagnosis not present

## 2023-02-18 DIAGNOSIS — M6281 Muscle weakness (generalized): Secondary | ICD-10-CM | POA: Diagnosis not present

## 2023-02-18 DIAGNOSIS — R2689 Other abnormalities of gait and mobility: Secondary | ICD-10-CM | POA: Diagnosis not present

## 2023-02-18 DIAGNOSIS — M25512 Pain in left shoulder: Secondary | ICD-10-CM | POA: Diagnosis not present

## 2023-02-18 LAB — MULTIPLE MYELOMA PANEL, SERUM
Albumin SerPl Elph-Mcnc: 3.6 g/dL (ref 2.9–4.4)
Albumin/Glob SerPl: 1.3 (ref 0.7–1.7)
Alpha 1: 0.3 g/dL (ref 0.0–0.4)
Alpha2 Glob SerPl Elph-Mcnc: 0.8 g/dL (ref 0.4–1.0)
B-Globulin SerPl Elph-Mcnc: 1 g/dL (ref 0.7–1.3)
Gamma Glob SerPl Elph-Mcnc: 0.8 g/dL (ref 0.4–1.8)
Globulin, Total: 2.8 g/dL (ref 2.2–3.9)
IgA: 13 mg/dL — ABNORMAL LOW (ref 64–422)
IgG (Immunoglobin G), Serum: 867 mg/dL (ref 586–1602)
IgM (Immunoglobulin M), Srm: 21 mg/dL — ABNORMAL LOW (ref 26–217)
M Protein SerPl Elph-Mcnc: 0.4 g/dL — ABNORMAL HIGH
Total Protein ELP: 6.4 g/dL (ref 6.0–8.5)

## 2023-02-19 ENCOUNTER — Other Ambulatory Visit: Payer: Self-pay | Admitting: Physician Assistant

## 2023-02-24 DIAGNOSIS — R2689 Other abnormalities of gait and mobility: Secondary | ICD-10-CM | POA: Diagnosis not present

## 2023-02-24 DIAGNOSIS — Z4789 Encounter for other orthopedic aftercare: Secondary | ICD-10-CM | POA: Diagnosis not present

## 2023-02-24 DIAGNOSIS — M6281 Muscle weakness (generalized): Secondary | ICD-10-CM | POA: Diagnosis not present

## 2023-02-24 DIAGNOSIS — M25512 Pain in left shoulder: Secondary | ICD-10-CM | POA: Diagnosis not present

## 2023-02-26 DIAGNOSIS — Z4789 Encounter for other orthopedic aftercare: Secondary | ICD-10-CM | POA: Diagnosis not present

## 2023-02-26 DIAGNOSIS — R2689 Other abnormalities of gait and mobility: Secondary | ICD-10-CM | POA: Diagnosis not present

## 2023-02-26 DIAGNOSIS — M6281 Muscle weakness (generalized): Secondary | ICD-10-CM | POA: Diagnosis not present

## 2023-02-26 DIAGNOSIS — M25512 Pain in left shoulder: Secondary | ICD-10-CM | POA: Diagnosis not present

## 2023-03-02 DIAGNOSIS — M25512 Pain in left shoulder: Secondary | ICD-10-CM | POA: Diagnosis not present

## 2023-03-02 DIAGNOSIS — R2689 Other abnormalities of gait and mobility: Secondary | ICD-10-CM | POA: Diagnosis not present

## 2023-03-02 DIAGNOSIS — Z4789 Encounter for other orthopedic aftercare: Secondary | ICD-10-CM | POA: Diagnosis not present

## 2023-03-02 DIAGNOSIS — M6281 Muscle weakness (generalized): Secondary | ICD-10-CM | POA: Diagnosis not present

## 2023-03-04 DIAGNOSIS — R2689 Other abnormalities of gait and mobility: Secondary | ICD-10-CM | POA: Diagnosis not present

## 2023-03-04 DIAGNOSIS — Z4789 Encounter for other orthopedic aftercare: Secondary | ICD-10-CM | POA: Diagnosis not present

## 2023-03-04 DIAGNOSIS — M25512 Pain in left shoulder: Secondary | ICD-10-CM | POA: Diagnosis not present

## 2023-03-04 DIAGNOSIS — M6281 Muscle weakness (generalized): Secondary | ICD-10-CM | POA: Diagnosis not present

## 2023-03-09 DIAGNOSIS — R2689 Other abnormalities of gait and mobility: Secondary | ICD-10-CM | POA: Diagnosis not present

## 2023-03-09 DIAGNOSIS — Z4789 Encounter for other orthopedic aftercare: Secondary | ICD-10-CM | POA: Diagnosis not present

## 2023-03-09 DIAGNOSIS — M6281 Muscle weakness (generalized): Secondary | ICD-10-CM | POA: Diagnosis not present

## 2023-03-09 DIAGNOSIS — M25512 Pain in left shoulder: Secondary | ICD-10-CM | POA: Diagnosis not present

## 2023-03-10 ENCOUNTER — Other Ambulatory Visit: Payer: Self-pay | Admitting: Hematology and Oncology

## 2023-03-10 DIAGNOSIS — C9000 Multiple myeloma not having achieved remission: Secondary | ICD-10-CM

## 2023-03-11 ENCOUNTER — Other Ambulatory Visit: Payer: Self-pay

## 2023-03-11 DIAGNOSIS — M25512 Pain in left shoulder: Secondary | ICD-10-CM | POA: Diagnosis not present

## 2023-03-11 DIAGNOSIS — Z4789 Encounter for other orthopedic aftercare: Secondary | ICD-10-CM | POA: Diagnosis not present

## 2023-03-11 DIAGNOSIS — R2689 Other abnormalities of gait and mobility: Secondary | ICD-10-CM | POA: Diagnosis not present

## 2023-03-11 DIAGNOSIS — M6281 Muscle weakness (generalized): Secondary | ICD-10-CM | POA: Diagnosis not present

## 2023-03-12 ENCOUNTER — Inpatient Hospital Stay: Payer: Medicare PPO | Admitting: Hematology and Oncology

## 2023-03-12 ENCOUNTER — Inpatient Hospital Stay: Payer: Medicare PPO | Attending: Hematology and Oncology

## 2023-03-12 ENCOUNTER — Other Ambulatory Visit: Payer: Self-pay

## 2023-03-12 ENCOUNTER — Inpatient Hospital Stay: Payer: Medicare PPO

## 2023-03-12 VITALS — Resp 15

## 2023-03-12 VITALS — BP 153/65 | HR 62 | Temp 98.1°F | Wt 108.9 lb

## 2023-03-12 DIAGNOSIS — M8580 Other specified disorders of bone density and structure, unspecified site: Secondary | ICD-10-CM | POA: Diagnosis not present

## 2023-03-12 DIAGNOSIS — I739 Peripheral vascular disease, unspecified: Secondary | ICD-10-CM | POA: Insufficient documentation

## 2023-03-12 DIAGNOSIS — C9000 Multiple myeloma not having achieved remission: Secondary | ICD-10-CM

## 2023-03-12 DIAGNOSIS — Z800 Family history of malignant neoplasm of digestive organs: Secondary | ICD-10-CM | POA: Diagnosis not present

## 2023-03-12 DIAGNOSIS — Z86718 Personal history of other venous thrombosis and embolism: Secondary | ICD-10-CM | POA: Insufficient documentation

## 2023-03-12 DIAGNOSIS — Z79899 Other long term (current) drug therapy: Secondary | ICD-10-CM | POA: Diagnosis not present

## 2023-03-12 DIAGNOSIS — I10 Essential (primary) hypertension: Secondary | ICD-10-CM | POA: Insufficient documentation

## 2023-03-12 DIAGNOSIS — E538 Deficiency of other specified B group vitamins: Secondary | ICD-10-CM | POA: Diagnosis not present

## 2023-03-12 DIAGNOSIS — Z5112 Encounter for antineoplastic immunotherapy: Secondary | ICD-10-CM | POA: Diagnosis not present

## 2023-03-12 LAB — CBC WITH DIFFERENTIAL (CANCER CENTER ONLY)
Abs Immature Granulocytes: 0.01 10*3/uL (ref 0.00–0.07)
Basophils Absolute: 0 10*3/uL (ref 0.0–0.1)
Basophils Relative: 1 %
Eosinophils Absolute: 0.1 10*3/uL (ref 0.0–0.5)
Eosinophils Relative: 4 %
HCT: 29.2 % — ABNORMAL LOW (ref 36.0–46.0)
Hemoglobin: 10.1 g/dL — ABNORMAL LOW (ref 12.0–15.0)
Immature Granulocytes: 0 %
Lymphocytes Relative: 43 %
Lymphs Abs: 1.4 10*3/uL (ref 0.7–4.0)
MCH: 31 pg (ref 26.0–34.0)
MCHC: 34.6 g/dL (ref 30.0–36.0)
MCV: 89.6 fL (ref 80.0–100.0)
Monocytes Absolute: 0.5 10*3/uL (ref 0.1–1.0)
Monocytes Relative: 16 %
Neutro Abs: 1.2 10*3/uL — ABNORMAL LOW (ref 1.7–7.7)
Neutrophils Relative %: 36 %
Platelet Count: 281 10*3/uL (ref 150–400)
RBC: 3.26 MIL/uL — ABNORMAL LOW (ref 3.87–5.11)
RDW: 13.6 % (ref 11.5–15.5)
WBC Count: 3.3 10*3/uL — ABNORMAL LOW (ref 4.0–10.5)
nRBC: 0 % (ref 0.0–0.2)

## 2023-03-12 LAB — CMP (CANCER CENTER ONLY)
ALT: 6 U/L (ref 0–44)
AST: 16 U/L (ref 15–41)
Albumin: 4 g/dL (ref 3.5–5.0)
Alkaline Phosphatase: 94 U/L (ref 38–126)
Anion gap: 9 (ref 5–15)
BUN: 20 mg/dL (ref 8–23)
CO2: 20 mmol/L — ABNORMAL LOW (ref 22–32)
Calcium: 8.9 mg/dL (ref 8.9–10.3)
Chloride: 107 mmol/L (ref 98–111)
Creatinine: 0.71 mg/dL (ref 0.44–1.00)
GFR, Estimated: >60 mL/min (ref >60)
Glucose, Bld: 102 mg/dL — ABNORMAL HIGH (ref 70–99)
Potassium: 4.1 mmol/L (ref 3.5–5.1)
Sodium: 136 mmol/L (ref 135–145)
Total Bilirubin: 0.3 mg/dL (ref 0.3–1.2)
Total Protein: 6.8 g/dL (ref 6.5–8.1)

## 2023-03-12 LAB — LACTATE DEHYDROGENASE: LDH: 148 U/L (ref 98–192)

## 2023-03-12 MED ORDER — ACETAMINOPHEN 325 MG PO TABS
650.0000 mg | ORAL_TABLET | Freq: Once | ORAL | Status: AC
  Administered 2023-03-12: 650 mg via ORAL
  Filled 2023-03-12: qty 2

## 2023-03-12 MED ORDER — DARATUMUMAB-HYALURONIDASE-FIHJ 1800-30000 MG-UT/15ML ~~LOC~~ SOLN
1800.0000 mg | Freq: Once | SUBCUTANEOUS | Status: AC
  Administered 2023-03-12: 1800 mg via SUBCUTANEOUS
  Filled 2023-03-12: qty 15

## 2023-03-12 MED ORDER — DIPHENHYDRAMINE HCL 25 MG PO CAPS
50.0000 mg | ORAL_CAPSULE | Freq: Once | ORAL | Status: AC
  Administered 2023-03-12: 50 mg via ORAL
  Filled 2023-03-12: qty 2

## 2023-03-12 MED ORDER — DEXAMETHASONE 4 MG PO TABS
20.0000 mg | ORAL_TABLET | Freq: Once | ORAL | Status: AC
  Administered 2023-03-12: 20 mg via ORAL
  Filled 2023-03-12: qty 5

## 2023-03-12 MED ORDER — CYANOCOBALAMIN 1000 MCG/ML IJ SOLN
1000.0000 ug | Freq: Once | INTRAMUSCULAR | Status: AC
  Administered 2023-03-12: 1000 ug via INTRAMUSCULAR
  Filled 2023-03-12: qty 1

## 2023-03-12 NOTE — Progress Notes (Signed)
[facility] [phone]   [phone]  PROGRESS NOTE  Patient Care Team: Geoffry Paradise, MD as PCP - General (Internal Medicine)  Hematological/Oncological History # IgG Lambda Multiple Myeloma 06/04/2021: WBC 5.5, Hgb 11.1, MCV 101.3, Plt 306 07/24/2021: M protein 2.3, IFE shows IgG Lambda monoclonal specificity. Cr 0.9 08/07/2021: Establish care with Dr. Leonides Schanz 09/27/2021: Bmbx showed hypercellular bone marrow with plasma cell neoplasm, increased number of atypical plasma cells averaging 20% of all cells in the aspirate 10/18/2021: Cycle 1 of Velcade/Dexamethasone 11/08/2021: Cycle 2 of Velcade/Dexamethasone 11/29/2021: Cycle 3 of Velcade/Dexamethasone 12/20/2021: Cycle 4 of Velcade/Dexamethasone 01/10/2022: Cycle 5 of Velcade/Dexamethasone 01/31/2022: Cycle 6 of Velcade/Dexamethasone 02/28/2022: Cycle 7 of Velcade/Dexamethasone. Dose reduced velcade due to neuropathy  03/14/2022: Cycle 1 Day 1 of Dara/Dex (velcade d/c due to neuropathy).  04/11/2022: Cycle 2 Day 1 of Dara/Dex 05/09/2022: Cycle 3 Day 1 of Dara/Dex 06/06/2022: Cycle 4 Day 1 of Dara/Dex 07/04/2022: Cycle 5 Day 1 of Dara/Dex 08/01/2022: Cycle 6 Day 1 of Dara/Dex 08/29/2022: Cycle 7 Day 1 of Dara/Dex 09/26/2022: Cycle 8 Day 1 of Dara/Dex 10/24/2022: Cycle 9 Day 1 of Dara/Dex 11/21/2022: Cycle 10 Day 1 of Dara/Dex 01/15/2023: Cycle 11 Day 1 of Dara/Dex 02/13/2023: Cycle 12 Day 1 of Dara/Dex 03/12/2023: Cycle 13 Day 1 of Dara/Dex  Interval History:  [name] Page 89 y.o. female with medical history significant for  IgG lambda multiple myeloma who presents for a follow up visit. The patient's last visit was on 01/15/2023. In the interim, patient has had no major changes in her health. She is due for Cycle 13, Day 1.  On exam today [name] Page is accompanied by her husband.  She reports she has been well overall interim since her last visit.  She reports she feels pretty good.  Her energy levels are  strong though her appetite is so-so.  Her weight has dropped slightly down to 108 pounds, down from 100 she reports that she has not had any issues with bone pain, back pain, or pain elsewhere.  She notes no nausea, Mi, diarrhea or symptoms as a result of her shock.  She is not having any itching or rash.  She reports that the shot is "easier as it goes on".  She notes that overall she has been walking steady and feels stable.  She has no additional questions comments or concerns today.  Overall she is willing and able to proceed with chemotherapy treatment at this time.  She denies fevers, chills, night sweats, shortness of breath, chest pain or cough.  She has no other complaints. Rest of the 10 point ROS is below.  MEDICAL HISTORY:  Past Medical History:  Diagnosis Date   Alopecia 10/28/2016   Anginal pain (HCC)    Anxiety    Arthritis    Cancer (HCC)    Cervical spondylosis    C3-4 AND C4-5   Chest pain 07/28/2008   H/O, normal stress nuclear EF 78%   Chronic cough 07/12/2014   Collapsed lung 07/2010   being treated at [facility] - in left lung   Complication of anesthesia    Cough 07/30/2010   Qualifier: Diagnosis of  By: Marchelle Gearing MD, Murali     Cystocele 05/23/2015   Cystocele, midline 05/02/2013   DEEP VEIN THROMBOSIS/PHLEBITIS 07/30/2010   Qualifier: History of  By: Yancey Flemings CMA, Jennifer     Dislocation closed, shoulder 02/2013   DVT (deep venous thrombosis) (HCC) 2006   Encounter for therapeutic drug monitoring 10/03/2014  GERD (gastroesophageal reflux disease)    occ   Headache    hx   High cholesterol    History of recurrent UTIs 09/24/2011   HYPERLIPIDEMIA 07/30/2010   Qualifier: Diagnosis of  By: Yancey Flemings CMA, Jennifer     Hypertension    HYPERTENSION 07/30/2010   Qualifier: Diagnosis of  By: Yancey Flemings CMA, Jennifer     Hypothyroidism    Nasal itching 01/30/2014   Osteopenia 05/23/2015   Peripheral vascular disease (HCC) 2006   dvt's and 50 yrs ago   Pleuritic pain  12/17/2015   PONV (postoperative nausea and vomiting)    Primary localized osteoarthritis of right hip 08/26/2016   Primary osteoarthritis of right hip 08/26/2016   Pulmonary collapse 07/30/2010   Qualifier: Diagnosis of  By: Marchelle Gearing MD, Murali     PULMONARY NODULE 07/30/2010   Qualifier: Diagnosis of  By: Marchelle Gearing MD, Murali     Rash and nonspecific skin eruption 01/02/2015   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   Sarcoidosis    Sarcoidosis of lung (HCC)    Vaginal atrophy 09/24/2011   Visual field defect nasal step 01/02/2015    SURGICAL HISTORY: Past Surgical History:  Procedure Laterality Date   BACK SURGERY  2022   EYE SURGERY     FOOT SURGERY Left 06/2005   bunion hammer toe   HEMORRHOID SURGERY  12/1972   INTRAOCULAR LENS INSERTION     right eye 10 /16 and left 06/16/2005   JOINT REPLACEMENT     KYPHOPLASTY N/A 06/27/2021   Procedure: LUMBAR 3 AND LUMBAR  5 KYPHOPLASTY;  Surgeon: Estill Bamberg, MD;  Location: [facility] OR;  Service: Orthopedics;  Laterality: N/A;   KYPHOPLASTY N/A 09/04/2021   Procedure: THORACIC SEVEN, THORACIC EIGHT, THORACIC NINE, THORACIC TEN KYPHOPLASTY;  Surgeon: Estill Bamberg, MD;  Location: [facility] OR;  Service: Orthopedics;  Laterality: N/A;   REVERSE SHOULDER ARTHROPLASTY Left 12/05/2022   Procedure: REVERSE SHOULDER ARTHROPLASTY;  Surgeon: Beverely Low, MD;  Location: [facility] OR[facility];  Service: Orthopedics;  Laterality: Left;  CHOICE WITH INTERSCALENE BLOCK   SKIN BIOPSY  01/2007   basal cell carcinoma removed from right lower leg    TOTAL HIP ARTHROPLASTY  06/2007   left   TOTAL HIP ARTHROPLASTY Right 08/26/2016   Procedure: TOTAL HIP ARTHROPLASTY ANTERIOR APPROACH;  Surgeon: Marcene Corning, MD;  Location: [facility] OR;  Service: Orthopedics;  Laterality: Right;   TOTAL KNEE ARTHROPLASTY Right 10/2007   right    SOCIAL HISTORY: Social History   Socioeconomic History   Marital status: Married    Spouse name: Not on file   Number of children: Not on file    Years of education: Not on file   Highest education level: Not on file  Occupational History   Occupation: retired    Associate Professor: RETIRED  Tobacco Use   Smoking status: Never   Smokeless tobacco: Never  Vaping Use   Vaping status: Never Used  Substance and Sexual Activity   Alcohol use: Not Currently    Comment: BEER/glass of wine A DAY   Drug use: No   Sexual activity: Never  Other Topics Concern   Not on file  Social History Narrative   Not on file   Social Determinants of Health   Financial Resource Strain: Not on file  Food Insecurity: No Food Insecurity (12/05/2022)   Hunger Vital Sign    Worried About Running Out of Food in the Last Year: Never true    Ran Out of Food in the  Last Year: Never true  Transportation Needs: No Transportation Needs (12/05/2022)   PRAPARE - Administrator, Civil Service (Medical): No    Lack of Transportation (Non-Medical): No  Physical Activity: Not on file  Stress: Not on file  Social Connections: Not on file  Intimate Partner Violence: Not At Risk (12/05/2022)   Humiliation, Afraid, Rape, and Kick questionnaire    Fear of Current or Ex-Partner: No    Emotionally Abused: No    Physically Abused: No    Sexually Abused: No    FAMILY HISTORY: Family History  Problem Relation Age of Onset   Diabetes Mother    Heart failure Mother    Hypertension Father    Diabetes Father    Heart disease Father    Heart attack Father     ALLERGIES:  has No Known Allergies.  MEDICATIONS:  Current Outpatient Medications  Medication Sig Dispense Refill   acyclovir (ZOVIRAX) 400 MG tablet TAKE ONE (1) TABLET BY MOUTH TWO (2) TIMES DAILY 60 tablet 2   amLODipine (NORVASC) 10 MG tablet Take 10 mg by mouth daily.     b complex vitamins capsule Take 1 capsule by mouth daily.     furosemide (LASIX) 20 MG tablet Take 20 mg by mouth daily as needed for edema.     gemfibrozil (LOPID) 600 MG tablet Take 600 mg by mouth 2 (two) times daily before a  meal.     HYDROcodone-acetaminophen (NORCO/VICODIN) 5-325 MG tablet TAKE 1 TABLET BY MOUTH EVERY 6 HOURS AS NEEDED FOR MODERATE PAIN 60 tablet 0   levothyroxine (SYNTHROID) 50 MCG tablet Take 50 mcg by mouth daily.     LORazepam (ATIVAN) 1 MG tablet Take 0.5 mg by mouth at bedtime as needed for anxiety.     losartan (COZAAR) 50 MG tablet Take 50 mg by mouth daily.     Multiple Vitamin (MULTIVITAMIN) capsule Take 1 capsule by mouth daily.     naproxen sodium (ALEVE) 220 MG tablet Take 440 mg by mouth daily as needed (pain).     nitrofurantoin (MACRODANTIN) 100 MG capsule daily.     pantoprazole (PROTONIX) 20 MG tablet Take 40 mg by mouth daily at 12 noon.     polyethylene glycol powder (GLYCOLAX/MIRALAX) 17 GM/SCOOP powder Take 17 g by mouth at bedtime.     pregabalin (LYRICA) 25 MG capsule Take 1 capsule (25 mg total) by mouth 2 (two) times daily. 60 capsule 3   trimethoprim (TRIMPEX) 100 MG tablet Take 100 mg by mouth daily.     No current facility-administered medications for this visit.    REVIEW OF SYSTEMS:   Constitutional: ( - ) fevers, ( - )  chills , ( - ) night sweats Eyes: ( - ) blurriness of vision, ( - ) double vision, ( - ) watery eyes Ears, nose, mouth, throat, and face: ( - ) mucositis, ( - ) sore throat Respiratory: ( - ) cough, ( - ) dyspnea, ( - ) wheezes Cardiovascular: ( - ) palpitation, ( - ) chest discomfort, ( - ) lower extremity swelling Gastrointestinal:  ( - ) nausea, ( - ) heartburn, ( - ) change in bowel habits Skin: ( - ) abnormal skin rashes Lymphatics: ( - ) new lymphadenopathy, ( - ) easy bruising Neurological: (+ ) numbness, ( +) tingling, ( - ) new weaknesses Behavioral/Psych: ( - ) mood change, ( - ) new changes  All other systems were reviewed with the patient and are  negative.  PHYSICAL EXAMINATION: ECOG PERFORMANCE STATUS: 1 - Symptomatic but completely ambulatory  Vitals:   03/12/23 1059  BP: (!) 153/65  Pulse: 62  Temp: 98.1 F (36.7 C)   SpO2: 100%       Filed Weights   03/12/23 1059  Weight: 108 lb 14.4 oz (49.4 kg)      GENERAL: Well-appearing elderly Caucasian female, alert, no distress and comfortable SKIN: skin color, texture, turgor are normal, no rashes or significant lesions EYES: conjunctiva are pink and non-injected, sclera clear LUNGS: clear to auscultation and percussion with normal breathing effort HEART: regular rate & rhythm and no murmurs and no lower extremity edema Musculoskeletal: no cyanosis of digits and no clubbing  PSYCH: alert & oriented x 3, fluent speech NEURO: no focal motor/sensory deficits  LABORATORY DATA:  I have reviewed the data as listed    Latest Ref Rng & Units 03/12/2023    9:59 AM 02/13/2023    8:58 AM 01/15/2023    8:58 AM  CBC  WBC 4.0 - 10.5 K/uL 3.3  2.8  5.6   Hemoglobin 12.0 - 15.0 g/dL 40.9  81.1  91.4   Hematocrit 36.0 - 46.0 % 29.2  30.9  31.0   Platelets 150 - 400 K/uL 281  261  301        Latest Ref Rng & Units 03/12/2023    9:59 AM 02/13/2023    8:58 AM 01/15/2023    8:58 AM  CMP  Glucose 70 - 99 mg/dL 782  86  99   BUN 8 - 23 mg/dL 20  15  24    Creatinine 0.44 - 1.00 mg/dL 9.56  2.13  0.86   Sodium 135 - 145 mmol/L 136  136  137   Potassium 3.5 - 5.1 mmol/L 4.1  3.9  4.1   Chloride 98 - 111 mmol/L 107  104  104   CO2 22 - 32 mmol/L 20  22  22    Calcium 8.9 - 10.3 mg/dL 8.9  9.3  9.4   Total Protein 6.5 - 8.1 g/dL 6.8  6.5  7.5   Total Bilirubin 0.3 - 1.2 mg/dL 0.3  0.4  0.3   Alkaline Phos 38 - 126 U/L 94  108  112   AST 15 - 41 U/L 16  16  16    ALT 0 - 44 U/L 6  7  6      Lab Results  Component Value Date   MPROTEIN 0.4 (H) 02/13/2023   MPROTEIN 0.4 (H) 01/15/2023   MPROTEIN 0.2 (H) 11/21/2022   Lab Results  Component Value Date   KPAFRELGTCHN 7.0 03/12/2023   KPAFRELGTCHN 6.5 02/13/2023   KPAFRELGTCHN 8.5 01/15/2023   LAMBDASER 5.6 (L) 03/12/2023   LAMBDASER 7.5 02/13/2023   LAMBDASER 9.6 01/15/2023   KAPLAMBRATIO 1.25 03/12/2023    KAPLAMBRATIO 0.87 02/13/2023   KAPLAMBRATIO 0.89 01/15/2023   RADIOGRAPHIC STUDIES: No results found.  ASSESSMENT & PLAN [name] Page 89 y.o. female with medical history significant for newly diagnosed IgG lambda multiple myeloma who presents for a follow up visit.  #IgG Lambda Multiple Myeloma -- Diagnosis confirmed by bone marrow biopsy on 09/27/2021 which shows evidence of 20% involvement by plasma cells.  Additionally has anemia, macrocytosis, leukopenia, and concern for lytic lesions on bone imaging --Prognostic panel shows a deletion 17 with a TP53 mutation --Started Cycle 1 Day 1 of Velcade/Dex on 10/18/2021 --Switched to Dara/Dex on 03/14/2022 due to neuropathy secondary to Velcade.  Plan: --Due  for Cycle 13 Day 1 of Dara/Dex today  --Labs from 01/15/2023 showed M protein 0.4, kappa and lambda light chains normal. Today's myeloma labs pending.  --Labs today show white blood cell count 3.3, hemoglobin 10.1, MCV 89.6, and platelets of 281, Creatinine and LFTs normal.  --if Hgb remains consistently >10 can consider the addition of revlimid.  --RTC in 4 weeks with labs, follow up visit and Dara/Dex treatments.   # H/O Confusion --decreased dexamethasone to 20 mg on infusion days.  --Symptoms resolved.   #Leukopenia/Macrocytic anemia/Vitamin B12 deficiency: --Secondary to chemotherapy and multiple myeloma --Received vitamin B12 injection 1000 mcg weekly x 4 doses. Continue q 4 weeks.  -- continue p.o. supplementation.  #Right rib pain, improved: --CT chest from 10/25/2021 that didn't show any new or progressive disease.   #Abdominal cramping--resolved: --Symptoms occur after velcade injection.  --Has Bentyl as needed  #Neuropathy: --Grade 2 involving finger tips and feet --Secondary to velcade  --Currently on Lyrica 25 mg PO BID.   #Supportive Care -- chemotherapy education complete -- port placement not required.  -- zofran 8mg  q8H PRN and compazine 10mg  PO q6H for  nausea -- acyclovir 400mg  PO BID for VCZ prophylaxis. Patient was not taking medication as prescribed.  -- Await dental clearance to start Zometa  No orders of the defined types were placed in this encounter.  All questions were answered. The patient knows to call the clinic with any problems, questions or concerns.  I have spent a total of 30 minutes minutes of face-to-face and non-face-to-face time, preparing to see the patient, performing a medically appropriate examination, counseling and educating the patient, ordering medications, documenting clinical information in the electronic health record,  and care coordination.   Ulysees Barns, MD Department of Hematology/Oncology [facility] at [facility] Phone: [phone] Pager: [phone] Email: [email or web address]  03/15/2023 5:44 PM

## 2023-03-12 NOTE — Patient Instructions (Signed)
[facility]  Discharge Instructions: Thank you for choosing [facility] to provide your oncology and hematology care.   If you have a lab appointment with the Cancer Center, please go directly to the Cancer Center and check in at the registration area.   Wear comfortable clothing and clothing appropriate for easy access to any Portacath or PICC line.   We strive to give you quality time with your provider. You may need to reschedule your appointment if you arrive late (15 or more minutes).  Arriving late affects you and other patients whose appointments are after yours.  Also, if you miss three or more appointments without notifying the office, you may be dismissed from the clinic at the provider's discretion.      For prescription refill requests, have your pharmacy contact our office and allow 72 hours for refills to be completed.    Today you received the following chemotherapy and/or immunotherapy agents Darzalex Faspro.      To help prevent nausea and vomiting after your treatment, we encourage you to take your nausea medication as directed.  BELOW ARE SYMPTOMS THAT SHOULD BE REPORTED IMMEDIATELY: *FEVER GREATER THAN 100.4 F (38 C) OR HIGHER *CHILLS OR SWEATING *NAUSEA AND VOMITING THAT IS NOT CONTROLLED WITH YOUR NAUSEA MEDICATION *UNUSUAL SHORTNESS OF BREATH *UNUSUAL BRUISING OR BLEEDING *URINARY PROBLEMS (pain or burning when urinating, or frequent urination) *BOWEL PROBLEMS (unusual diarrhea, constipation, pain near the anus) TENDERNESS IN MOUTH AND THROAT WITH OR WITHOUT PRESENCE OF ULCERS (sore throat, sores in mouth, or a toothache) UNUSUAL RASH, SWELLING OR PAIN  UNUSUAL VAGINAL DISCHARGE OR ITCHING   Items with * indicate a potential emergency and should be followed up as soon as possible or go to the Emergency Department if any problems should occur.  Please show the CHEMOTHERAPY ALERT CARD or IMMUNOTHERAPY ALERT CARD at  check-in to the Emergency Department and triage nurse.  Should you have questions after your visit or need to cancel or reschedule your appointment, please contact [facility]  Dept: [phone]  and follow the prompts.  Office hours are 8:00 a.m. to 4:30 p.m. Monday - Friday. Please note that voicemails left after 4:00 p.m. may not be returned until the following business day.  We are closed weekends and major holidays. You have access to a nurse at all times for urgent questions. Please call the main number to the clinic Dept: [phone] and follow the prompts.   For any non-urgent questions, you may also contact your provider using MyChart. We now offer e-Visits for anyone 78 and older to request care online for non-urgent symptoms. For details visit [email or web address].   Also download the MyChart app! Go to the app store, search "MyChart", open the app, select [facility], and log in with your MyChart username and password.  Vitamin B12 Injection What is this medication? Vitamin B12 (VAHY tuh min B12) prevents and treats low vitamin B12 levels in your body. It is used in people who do not get enough vitamin B12 from their diet or when their digestive tract does not absorb enough. Vitamin B12 plays an important role in maintaining the health of your nervous system and red blood cells. This medicine may be used for other purposes; ask your health care provider or pharmacist if you have questions. COMMON BRAND NAME(S): B-12 Compliance Kit, B-12 Injection Kit, Cyomin, Dodex, LA-12, Nutri-Twelve, Physicians EZ Use B-12, Primabalt, Vitamin Deficiency Injectable  System - B12 What should I tell my care team before I take this medication? They need to know if you have any of these conditions: Kidney disease Leber's disease Megaloblastic anemia An unusual or allergic reaction to cyanocobalamin, cobalt, other medications, foods, dyes, or  preservatives Pregnant or trying to get pregnant Breast-feeding How should I use this medication? This medication is injected into a muscle or deeply under the skin. It is usually given in a clinic or care team's office. However, your care team may teach you how to inject yourself. Follow all instructions. Talk to your care team about the use of this medication in children. Special care may be needed. Overdosage: If you think you have taken too much of this medicine contact a poison control center or emergency room at once. NOTE: This medicine is only for you. Do not share this medicine with others. What if I miss a dose? If you are given your dose at a clinic or care team's office, call to reschedule your appointment. If you give your own injections, and you miss a dose, take it as soon as you can. If it is almost time for your next dose, take only that dose. Do not take double or extra doses. What may interact with this medication? Alcohol Colchicine This list may not describe all possible interactions. Give your health care provider a list of all the medicines, herbs, non-prescription drugs, or dietary supplements you use. Also tell them if you smoke, drink alcohol, or use illegal drugs. Some items may interact with your medicine. What should I watch for while using this medication? Visit your care team regularly. You may need blood work done while you are taking this medication. You may need to follow a special diet. Talk to your care team. Limit your alcohol intake and avoid smoking to get the best benefit. What side effects may I notice from receiving this medication? Side effects that you should report to your care team as soon as possible: Allergic reactions--skin rash, itching, hives, swelling of the face, lips, tongue, or throat Swelling of the ankles, hands, or feet Trouble breathing Side effects that usually do not require medical attention (report to your care team if they continue  or are bothersome): Diarrhea This list may not describe all possible side effects. Call your doctor for medical advice about side effects. You may report side effects to FDA at 1-800-FDA-1088. Where should I keep my medication? Keep out of the reach of children. Store at room temperature between 15 and 30 degrees C (59 and 85 degrees F). Protect from light. Throw away any unused medication after the expiration date. NOTE: This sheet is a summary. It may not cover all possible information. If you have questions about this medicine, talk to your doctor, pharmacist, or health care provider.  2024 Elsevier/Gold Standard (2021-04-09 00:00:00)

## 2023-03-12 NOTE — Progress Notes (Signed)
ANC 1.2, Per Dr. Leonides Schanz ok to treat w/ Darzalex faspro today.

## 2023-03-15 ENCOUNTER — Encounter: Payer: Self-pay | Admitting: Hematology and Oncology

## 2023-03-16 DIAGNOSIS — Z4789 Encounter for other orthopedic aftercare: Secondary | ICD-10-CM | POA: Diagnosis not present

## 2023-03-16 DIAGNOSIS — M6281 Muscle weakness (generalized): Secondary | ICD-10-CM | POA: Diagnosis not present

## 2023-03-16 DIAGNOSIS — M25512 Pain in left shoulder: Secondary | ICD-10-CM | POA: Diagnosis not present

## 2023-03-16 DIAGNOSIS — R2689 Other abnormalities of gait and mobility: Secondary | ICD-10-CM | POA: Diagnosis not present

## 2023-03-18 DIAGNOSIS — Z4789 Encounter for other orthopedic aftercare: Secondary | ICD-10-CM | POA: Diagnosis not present

## 2023-03-18 DIAGNOSIS — M25512 Pain in left shoulder: Secondary | ICD-10-CM | POA: Diagnosis not present

## 2023-03-18 DIAGNOSIS — R2689 Other abnormalities of gait and mobility: Secondary | ICD-10-CM | POA: Diagnosis not present

## 2023-03-18 DIAGNOSIS — M6281 Muscle weakness (generalized): Secondary | ICD-10-CM | POA: Diagnosis not present

## 2023-03-23 DIAGNOSIS — M6281 Muscle weakness (generalized): Secondary | ICD-10-CM | POA: Diagnosis not present

## 2023-03-23 DIAGNOSIS — R2689 Other abnormalities of gait and mobility: Secondary | ICD-10-CM | POA: Diagnosis not present

## 2023-03-23 DIAGNOSIS — Z4789 Encounter for other orthopedic aftercare: Secondary | ICD-10-CM | POA: Diagnosis not present

## 2023-03-23 DIAGNOSIS — M25512 Pain in left shoulder: Secondary | ICD-10-CM | POA: Diagnosis not present

## 2023-03-25 DIAGNOSIS — Z4789 Encounter for other orthopedic aftercare: Secondary | ICD-10-CM | POA: Diagnosis not present

## 2023-03-25 DIAGNOSIS — R2689 Other abnormalities of gait and mobility: Secondary | ICD-10-CM | POA: Diagnosis not present

## 2023-03-25 DIAGNOSIS — M6281 Muscle weakness (generalized): Secondary | ICD-10-CM | POA: Diagnosis not present

## 2023-03-25 DIAGNOSIS — M25512 Pain in left shoulder: Secondary | ICD-10-CM | POA: Diagnosis not present

## 2023-03-26 DIAGNOSIS — R351 Nocturia: Secondary | ICD-10-CM | POA: Diagnosis not present

## 2023-03-28 ENCOUNTER — Other Ambulatory Visit: Payer: Self-pay

## 2023-03-30 DIAGNOSIS — M25512 Pain in left shoulder: Secondary | ICD-10-CM | POA: Diagnosis not present

## 2023-03-30 DIAGNOSIS — M6281 Muscle weakness (generalized): Secondary | ICD-10-CM | POA: Diagnosis not present

## 2023-03-30 DIAGNOSIS — Z4789 Encounter for other orthopedic aftercare: Secondary | ICD-10-CM | POA: Diagnosis not present

## 2023-03-30 DIAGNOSIS — R2689 Other abnormalities of gait and mobility: Secondary | ICD-10-CM | POA: Diagnosis not present

## 2023-04-01 ENCOUNTER — Telehealth: Payer: Self-pay | Admitting: Pharmacy Technician

## 2023-04-01 ENCOUNTER — Other Ambulatory Visit (HOSPITAL_COMMUNITY): Payer: Self-pay

## 2023-04-01 ENCOUNTER — Other Ambulatory Visit: Payer: Self-pay | Admitting: *Deleted

## 2023-04-01 ENCOUNTER — Telehealth: Payer: Self-pay | Admitting: *Deleted

## 2023-04-01 ENCOUNTER — Telehealth: Payer: Self-pay | Admitting: Pharmacist

## 2023-04-01 DIAGNOSIS — M6281 Muscle weakness (generalized): Secondary | ICD-10-CM | POA: Diagnosis not present

## 2023-04-01 DIAGNOSIS — R2689 Other abnormalities of gait and mobility: Secondary | ICD-10-CM | POA: Diagnosis not present

## 2023-04-01 DIAGNOSIS — Z4789 Encounter for other orthopedic aftercare: Secondary | ICD-10-CM | POA: Diagnosis not present

## 2023-04-01 DIAGNOSIS — C9000 Multiple myeloma not having achieved remission: Secondary | ICD-10-CM

## 2023-04-01 DIAGNOSIS — M25512 Pain in left shoulder: Secondary | ICD-10-CM | POA: Diagnosis not present

## 2023-04-01 MED ORDER — LENALIDOMIDE 10 MG PO CAPS
10.0000 mg | ORAL_CAPSULE | Freq: Every day | ORAL | 0 refills | Status: DC
Start: 2023-04-01 — End: 2023-04-03

## 2023-04-01 MED ORDER — LENALIDOMIDE 10 MG PO CAPS: 10.0000 mg | ORAL_CAPSULE | Freq: Every day | ORAL | 0 refills | Status: DC

## 2023-04-01 NOTE — Telephone Encounter (Signed)
Oral Oncology Patient Advocate Encounter  After completing a benefits investigation, prior authorization for lenalidomide is not required at this time through [facility].  Patient's copay is $100.   PA on file is valid until 08/11/23  Patient has an active [facility] that will bring the copay to $0.  Patient can fill with [facility].  Jinger Neighbors, CPhT-Adv Oncology Pharmacy Patient Advocate [facility] Direct Number: [phone]  Fax: [phone]

## 2023-04-01 NOTE — Telephone Encounter (Signed)
Oral Oncology Pharmacist Encounter  Received new prescription for Revlimid (lenalidomide) for the treatment of multiple myeloma in conjunction with daratumumab, bortezomib and dexamethasone, planned duration until disease progression or unacceptable drug toxicity. Planned start date of Revlimid is 04/10/23.  CMP and CBC w/ Diff from 03/12/23 assessed, noted patient with Scr of 0.71 mg/dL (CrCl ~16.0 mL/min) - dose has been renally dose adjusted to account for renal dysfunction. Patient WBC 3.3 K/uL (ANC 1200) and Hgb 10.1 g/dL. Prescription dose and frequency assessed for appropriateness.  Current medication list in Epic reviewed, no relevant/significant DDIs with Revlimid identified.  Evaluated chart and no patient barriers to medication adherence noted.   Prescription has been e-scribed to [facility] due to Revlimid being a limited distribution drug.   Oral Oncology Clinic will continue to follow for insurance authorization, copayment issues, initial counseling and start date.  Lenord Carbo, PharmD, BCPS, [facility] Hematology/Oncology Clinical Pharmacist [facility] and [facility] Oral Chemotherapy Navigation Clinics [phone] 04/01/2023 2:35 PM

## 2023-04-01 NOTE — Telephone Encounter (Signed)
TCT patient to enroll patient in Revlimid program. Spoke to her. Advised that Dr. Leonides Schanz is now ready to add the Revlimid to her treatment plan as her M-protein has increased some. Pt states she and Dr. Leonides Schanz had talked about this recently so she is not surprised by this. Enrollment done over the phone. Pt is agreeable to start this with her next cycle of Dara which is on 04/10/23. Advised her to expect to hear from our oral chemo pharmacist in the next few days. Pt understands to work with whichever mail order specialty pharmacy her insurance asks for as far as delivery of her Revlimid. [place] and packages are not typically delivered to their door. Advised  that if needed the mail order pharmacy could deliver to her community pharmacy. She voiced understanding.

## 2023-04-01 NOTE — Telephone Encounter (Signed)
Oral Oncology Patient Advocate Encounter  Was successful in securing patient a $12,000 grant from [facility] to provide copayment coverage for lenalidomide.  This will keep the out of pocket expense at $0.     [employer] ID: [id number]  I have spoken with the patient.   The billing information is as follows and has been shared with [facility].    RxBin: [id number] PCN: [id number] Member ID: [id number] Group ID: [id number] Dates of Eligibility: 03/02/23 through 02/29/24  Fund:  MM  Jinger Neighbors, CPhT-Adv Oncology Pharmacy Patient Advocate [facility] Direct Number: [phone]  Fax: [phone]

## 2023-04-03 ENCOUNTER — Other Ambulatory Visit (HOSPITAL_COMMUNITY): Payer: Self-pay

## 2023-04-03 MED ORDER — LENALIDOMIDE 10 MG PO CAPS
10.0000 mg | ORAL_CAPSULE | Freq: Every day | ORAL | 0 refills | Status: DC
Start: 2023-04-03 — End: 2023-04-28

## 2023-04-03 NOTE — Telephone Encounter (Signed)
Oral Chemotherapy Pharmacist Encounter   Called [facility] to check on status of Revlimid for patient. Was advised by representative that [facility] is not contracted to fill with patient's insurance since they have [facility]. Patient is required to fill with [facility].  Prescription has been redirected to [facility] for dispensing. [employer] grant has also been shared with pharmacy for billing.   Lenord Carbo, PharmD, BCPS, [facility] Hematology/Oncology Clinical Pharmacist [facility] and [facility] Oral Chemotherapy Navigation Clinics [phone] 04/03/2023 8:38 AM

## 2023-04-06 DIAGNOSIS — R2689 Other abnormalities of gait and mobility: Secondary | ICD-10-CM | POA: Diagnosis not present

## 2023-04-06 DIAGNOSIS — M25512 Pain in left shoulder: Secondary | ICD-10-CM | POA: Diagnosis not present

## 2023-04-06 DIAGNOSIS — M6281 Muscle weakness (generalized): Secondary | ICD-10-CM | POA: Diagnosis not present

## 2023-04-06 DIAGNOSIS — Z4789 Encounter for other orthopedic aftercare: Secondary | ICD-10-CM | POA: Diagnosis not present

## 2023-04-07 NOTE — Telephone Encounter (Signed)
Oral Chemotherapy Pharmacist Encounter  I spoke with patient for overview of: Revlimid (lenalidomide) for the treatment of multiple myeloma in conjunction with daratumumab, bortezomib and dexamethasone, planned duration until disease progression or unacceptable drug toxicity.   Counseled patient on administration, dosing, side effects, monitoring, drug-food interactions, safe handling, storage, and disposal.  Patient will take Revlimid 10mg  capsules, 1 capsule by mouth once daily, without regard to food, with a full glass of water.  Revlimid will be given 21 days on, 7 days off, repeat every 28 days.  Revlimid start date: 04/10/23 PM  Adverse effects of Revlimid include but are not limited to: GI upset, rash, fatigue, and decreased blood counts.   VTE PPX: patient states she will start taking aspirin 81 mg for VTE PPX while on Revlimid VZV PPX: patient continues on acyclovir  Reviewed with patient importance of keeping a medication schedule and plan for any missed doses. No barriers to medication adherence identified.  Medication reconciliation performed and medication/allergy list updated.  Insurance authorization for Revlimid has been obtained.  Revlimid prescription is being dispensed from [facility] specialty pharmacy as it is a limited distribution medication. Medication will deliver to patient's home on 04/07/23.  All questions answered.  Ms. Shoenfelt voiced understanding and appreciation.   Medication education handout placed in mail for patient. Patient knows to call the office with questions or concerns. Oral Chemotherapy Clinic phone number provided to patient.   Lenord Carbo, PharmD, BCPS, BCOP Hematology/Oncology Clinical Pharmacist [facility] and [facility] Oral Chemotherapy Navigation Clinics [phone] 04/07/2023 10:25 AM

## 2023-04-08 ENCOUNTER — Other Ambulatory Visit: Payer: Self-pay | Admitting: Hematology and Oncology

## 2023-04-08 DIAGNOSIS — Z4789 Encounter for other orthopedic aftercare: Secondary | ICD-10-CM | POA: Diagnosis not present

## 2023-04-08 DIAGNOSIS — R2689 Other abnormalities of gait and mobility: Secondary | ICD-10-CM | POA: Diagnosis not present

## 2023-04-08 DIAGNOSIS — M6281 Muscle weakness (generalized): Secondary | ICD-10-CM | POA: Diagnosis not present

## 2023-04-08 DIAGNOSIS — M25512 Pain in left shoulder: Secondary | ICD-10-CM | POA: Diagnosis not present

## 2023-04-08 DIAGNOSIS — C9000 Multiple myeloma not having achieved remission: Secondary | ICD-10-CM

## 2023-04-08 NOTE — Progress Notes (Signed)
[facility] OFFICE PROGRESS NOTE  Geoffry Paradise, MD [address] [place] [address]  DIAGNOSIS: IgG Lambda Multiple Myeloma  06/04/2021: WBC 5.5, Hgb 11.1, MCV 101.3, Plt 306 07/24/2021: M protein 2.3, IFE shows IgG Lambda monoclonal specificity. Cr 0.9 08/07/2021: Establish care with Dr. Leonides Schanz 09/27/2021: Bmbx showed hypercellular bone marrow with plasma cell neoplasm, increased number of atypical plasma cells averaging 20% of all cells in the aspirate 10/18/2021: Cycle 1 of Velcade/Dexamethasone 11/08/2021: Cycle 2 of Velcade/Dexamethasone 11/29/2021: Cycle 3 of Velcade/Dexamethasone 12/20/2021: Cycle 4 of Velcade/Dexamethasone 01/10/2022: Cycle 5 of Velcade/Dexamethasone 01/31/2022: Cycle 6 of Velcade/Dexamethasone 02/28/2022: Cycle 7 of Velcade/Dexamethasone. Dose reduced velcade due to neuropathy  03/14/2022: Cycle 1 Day 1 of Dara/Dex (velcade d/c due to neuropathy).  04/11/2022: Cycle 2 Day 1 of Dara/Dex 05/09/2022: Cycle 3 Day 1 of Dara/Dex 06/06/2022: Cycle 4 Day 1 of Dara/Dex 07/04/2022: Cycle 5 Day 1 of Dara/Dex 08/01/2022: Cycle 6 Day 1 of Dara/Dex 08/29/2022: Cycle 7 Day 1 of Dara/Dex 09/26/2022: Cycle 8 Day 1 of Dara/Dex 10/24/2022: Cycle 9 Day 1 of Dara/Dex 11/21/2022: Cycle 10 Day 1 of Dara/Dex 01/15/2023: Cycle 11 Day 1 of Dara/Dex 02/13/2023: Cycle 12 Day 1 of Dara/Dex 03/12/2023: Cycle 13 Day 1 of Dara/Dex 04/10/2023: Cycle 14 day 1 of Dara/Dex, (revlimid starting today 8/30)  CURRENT THERAPY: Today is Cycle 14 Day 1 of Dara/Dex, she is expected to start revlimid 10 mg today on 04/10/23.   INTERVAL HISTORY: [name] Page 89 y.o. female returns today for a follow-up visit accompanied by her husband.  The patient was last seen by Dr. Leonides Schanz on 03/12/2023.  The patient denies any major changes in her health since last being seen.  At her last appointment, Dr. Leonides Schanz recommended adding Revlimid 10 mg days to her treatment plan.  She has the medication at home and is  expected to start this today.  She reports stable energy. She lives at [facility] and reports they have a nice wellness center and pool to stay active. Her appetite is good.  Her weight is stable. Denies any unusual back pain, bone pain (she has chronic back pain which is unchanged), or pain elsewhere. She denies any nausea, vomiting, diarrhea, or constipation.  Denies any rashes or skin changes.  She is compliant with her 81 mg aspirin and acyclovir.  She denies any fever, chills, or night sweats. She denies any chest pain, shortness of breath, cough, or hemoptysis.  She denies any signs and symptoms of infection including dysuria, skin infections, sore throat, nasal congestion, or abdominal pain.  Dr. Derek Mound note mentions getting dental clearance for zometa. The patient was not aware of this and is wondering if that is something she needs. She last saw her dentist in may. She had shoulder surgery in April.   Started on revlimid on 04/10/23.    MEDICAL HISTORY: Past Medical History:  Diagnosis Date   Alopecia 10/28/2016   Anginal pain (HCC)    Anxiety    Arthritis    Cancer (HCC)    Cervical spondylosis    C3-4 AND C4-5   Chest pain 07/28/2008   H/O, normal stress nuclear EF 78%   Chronic cough 07/12/2014   Collapsed lung 07/2010   being treated at [facility] - in left lung   Complication of anesthesia    Cough 07/30/2010   Qualifier: Diagnosis of  By: Marchelle Gearing MD, Murali     Cystocele 05/23/2015   Cystocele, midline 05/02/2013   DEEP VEIN THROMBOSIS/PHLEBITIS 07/30/2010  Qualifier: History of  By: Yancey Flemings CMA, Jennifer     Dislocation closed, shoulder 02/2013   DVT (deep venous thrombosis) (HCC) 2006   Encounter for therapeutic drug monitoring 10/03/2014   GERD (gastroesophageal reflux disease)    occ   Headache    hx   High cholesterol    History of recurrent UTIs 09/24/2011   HYPERLIPIDEMIA 07/30/2010   Qualifier: Diagnosis of  By: Yancey Flemings CMA, Jennifer     Hypertension     HYPERTENSION 07/30/2010   Qualifier: Diagnosis of  By: Yancey Flemings CMA, Jennifer     Hypothyroidism    Nasal itching 01/30/2014   Osteopenia 05/23/2015   Peripheral vascular disease (HCC) 2006   dvt's and 50 yrs ago   Pleuritic pain 12/17/2015   PONV (postoperative nausea and vomiting)    Primary localized osteoarthritis of right hip 08/26/2016   Primary osteoarthritis of right hip 08/26/2016   Pulmonary collapse 07/30/2010   Qualifier: Diagnosis of  By: Marchelle Gearing MD, Murali     PULMONARY NODULE 07/30/2010   Qualifier: Diagnosis of  By: Marchelle Gearing MD, Murali     Rash and nonspecific skin eruption 01/02/2015   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  MEDICATIONS:  Current Outpatient Medications  Medication Sig Dispense Refill   acyclovir (ZOVIRAX) 400 MG tablet TAKE ONE (1) TABLET BY MOUTH TWO (2) TIMES DAILY 60 tablet 2   amLODipine (NORVASC) 10 MG tablet Take 10 mg by mouth daily.     b complex vitamins capsule Take 1 capsule by mouth daily.     furosemide (LASIX) 20 MG tablet Take 20 mg by mouth daily as needed for edema.     gemfibrozil (LOPID) 600 MG tablet Take 600 mg by mouth 2 (two) times daily before a meal.     HYDROcodone-acetaminophen (NORCO/VICODIN) 5-325 MG tablet TAKE 1 TABLET BY MOUTH EVERY 6 HOURS AS NEEDED FOR MODERATE PAIN 60 tablet 0   lenalidomide (REVLIMID) 10 MG capsule Take 1 capsule (10 mg total) by mouth daily. Take for 21 days, then none for 7 days. Repeat every 28 days. [employer] Auth # [id number]; Date Obtained 04/01/23 21 capsule 0   levothyroxine (SYNTHROID) 50 MCG tablet Take 50 mcg by mouth daily.     LORazepam (ATIVAN) 1 MG tablet Take 0.5 mg by mouth at bedtime as needed for anxiety.     losartan (COZAAR) 50 MG tablet Take 50 mg by mouth daily.     Multiple Vitamin (MULTIVITAMIN) capsule Take 1  capsule by mouth daily.     naproxen sodium (ALEVE) 220 MG tablet Take 440 mg by mouth daily as needed (pain).     nitrofurantoin (MACRODANTIN) 100 MG capsule daily.     pantoprazole (PROTONIX) 20 MG tablet Take 40 mg by mouth daily at 12 noon.     polyethylene glycol powder (GLYCOLAX/MIRALAX) 17 GM/SCOOP powder Take 17 g by mouth at bedtime.     pregabalin (LYRICA) 25 MG capsule Take 1 capsule (25 mg total) by mouth 2 (two) times daily. 60 capsule 3   trimethoprim (TRIMPEX) 100 MG tablet Take 100 mg by mouth daily.     No current facility-administered medications for this visit.    SURGICAL HISTORY:  Past Surgical History:  Procedure Laterality Date   BACK SURGERY  2022   EYE SURGERY  FOOT SURGERY Left 06/2005   bunion hammer toe   HEMORRHOID SURGERY  12/1972   INTRAOCULAR LENS INSERTION     right eye 10 /16 and left 06/16/2005   JOINT REPLACEMENT     KYPHOPLASTY N/A 06/27/2021   Procedure: LUMBAR 3 AND LUMBAR  5 KYPHOPLASTY;  Surgeon: Estill Bamberg, MD;  Location: [facility] OR;  Service: Orthopedics;  Laterality: N/A;   KYPHOPLASTY N/A 09/04/2021   Procedure: THORACIC SEVEN, THORACIC EIGHT, THORACIC NINE, THORACIC TEN KYPHOPLASTY;  Surgeon: Estill Bamberg, MD;  Location: [facility] OR;  Service: Orthopedics;  Laterality: N/A;   REVERSE SHOULDER ARTHROPLASTY Left 12/05/2022   Procedure: REVERSE SHOULDER ARTHROPLASTY;  Surgeon: Beverely Low, MD;  Location: [facility] OR[facility];  Service: Orthopedics;  Laterality: Left;  CHOICE WITH INTERSCALENE BLOCK   SKIN BIOPSY  01/2007   basal cell carcinoma removed from right lower leg    TOTAL HIP ARTHROPLASTY  06/2007   left   TOTAL HIP ARTHROPLASTY Right 08/26/2016   Procedure: TOTAL HIP ARTHROPLASTY ANTERIOR APPROACH;  Surgeon: Marcene Corning, MD;  Location: [facility] OR;  Service: Orthopedics;  Laterality: Right;   TOTAL KNEE ARTHROPLASTY Right 10/2007   right    REVIEW OF SYSTEMS:   Review of Systems  Constitutional: Negative for appetite change, chills, fatigue,  fever and unexpected weight change.  HENT: Negative for mouth sores, nosebleeds, sore throat and trouble swallowing.   Eyes: Negative for eye problems and icterus.  Respiratory: Negative for cough, hemoptysis, shortness of breath and wheezing.   Cardiovascular: Negative for chest pain and leg swelling.  Gastrointestinal: Negative for abdominal pain, constipation, diarrhea, nausea and vomiting.  Genitourinary: Negative for bladder incontinence, difficulty urinating, dysuria, frequency and hematuria.   Musculoskeletal: Positive for chronic back pain. Negative for gait problem, neck pain and neck stiffness.  Skin: Negative for itching and rash.  Neurological: Negative for dizziness, extremity weakness, gait problem, headaches, light-headedness and seizures.  Hematological: Negative for adenopathy. Does not bruise/bleed easily.  Psychiatric/Behavioral: Negative for confusion, depression and sleep disturbance. The patient is not nervous/anxious.     PHYSICAL EXAMINATION:  Blood pressure (!) 165/72, pulse 66, temperature 97.7 F (36.5 C), temperature source Oral, resp. rate 15, height 4\' 11"  (1.499 m), weight 111 lb 12.8 oz (50.7 kg), SpO2 100%.  ECOG PERFORMANCE STATUS: 1  Physical Exam  Constitutional: Oriented to person, place, and time and well-developed, well-nourished, and in no distress.  HENT:  Head: Normocephalic and atraumatic.  Mouth/Throat: Oropharynx is clear and moist. No oropharyngeal exudate.  Eyes: Conjunctivae are normal. Right eye exhibits no discharge. Left eye exhibits no discharge. No scleral icterus.  Neck: Normal range of motion. Neck supple.  Cardiovascular: Normal rate, regular rhythm, normal heart sounds and intact distal pulses.   Pulmonary/Chest: Effort normal and breath sounds normal. No respiratory distress. No wheezes. No rales.  Abdominal: Soft. Bowel sounds are normal. Exhibits no distension and no mass. There is no tenderness.  Musculoskeletal: Normal range  of motion. Exhibits no edema.  Lymphadenopathy:    No cervical adenopathy.  Neurological: Alert and oriented to person, place, and time. Exhibits normal muscle tone. Gait normal. Coordination normal.  Skin: Skin is warm and dry. No rash noted. Not diaphoretic. No erythema. No pallor.  Psychiatric: Mood, memory and judgment normal.  Vitals reviewed.  LABORATORY DATA: Lab Results  Component Value Date   WBC 3.3 (L) 04/10/2023   HGB 10.6 (L) 04/10/2023   HCT 31.2 (L) 04/10/2023   MCV 88.9 04/10/2023   PLT 248 04/10/2023  Chemistry      Component Value Date/Time   NA 133 (L) 04/10/2023 0836   NA 142 05/02/2019 1031   K 4.2 04/10/2023 0836   CL 104 04/10/2023 0836   CO2 22 04/10/2023 0836   BUN 12 04/10/2023 0836   BUN 16 05/02/2019 1031   CREATININE 0.65 04/10/2023 0836      Component Value Date/Time   CALCIUM 9.1 04/10/2023 0836   ALKPHOS 103 04/10/2023 0836   AST 17 04/10/2023 0836   ALT 7 04/10/2023 0836   BILITOT 0.3 04/10/2023 0836       RADIOGRAPHIC STUDIES:  No results found.   ASSESSMENT/PLAN:  [name] Page 89 y.o. female with medical history significant for newly diagnosed IgG lambda multiple myeloma who presents for a follow up visit.   #IgG Lambda Multiple Myeloma -- Diagnosis confirmed by bone marrow biopsy on 09/27/2021 which shows evidence of 20% involvement by plasma cells.  Additionally has anemia, macrocytosis, leukopenia, and concern for lytic lesions on bone imaging --Prognostic panel shows a deletion 17 with a TP53 mutation --Started Cycle 1 Day 1 of Velcade/Dex on 10/18/2021 --Switched to Dara/Dex on 03/14/2022 due to neuropathy secondary to Velcade.  Plan: --Due for Cycle 14 Day 1 of Dara/Dex today  --Labs from 01/15/2023 showed M protein 0.4, kappa and lambda light chains normal. 8/1 myeloma labs showed M protein of 0.6. Therefore, Dr. Leonides Schanz is adding Revlimid. She had education with the pharmacist and nurse today.  --Labs today show stable  labs with white blood cell count 3.3, hemoglobin 10.6, MCV 88.9, and platelets of 248, ANC stable.slightly improved at 1.4. She was treated with 1.1 and 1.1 previously. Creatinine and LFTs normal.  -Will add toxicity check in 1-1.5 weeks with repeat labs since she is starting revlimid.  --RTC in 4 weeks with labs, follow up visit and Dara/Dex treatments.  -She is starting Revlimid 10 mg today on 04/10/23 21 days on 7 days off every 4 weeks   # H/O Confusion --decreased dexamethasone to 20 mg on infusion days.  --Symptoms resolved.    #Leukopenia/Macrocytic anemia/Vitamin B12 deficiency: --Secondary to chemotherapy and multiple myeloma --Received vitamin B12 injection 1000 mcg weekly x 4 doses. Continue q 4 weeks.  -- continue p.o. supplementation.   #Right rib pain, improved: --CT chest from 10/25/2021 that didn't show any new or progressive disease.    #Abdominal cramping--resolved: --Symptoms occur after velcade injection.  --Has Bentyl as needed   #Neuropathy: --Grade 2 involving finger tips and feet --Secondary to velcade  --Currently on Lyrica 25 mg PO BID.    #Supportive Care -- chemotherapy education complete -- port placement not required.  -- zofran 8mg  q8H PRN and compazine 10mg  PO q6H for nausea -- acyclovir 400mg  PO BID for VCZ prophylaxis.  -- Await dental clearance to start Zometa. The patient will discuss with Dr. Leonides Schanz at next appointment    No orders of the defined types were placed in this encounter.    The total time spent in the appointment was 20-29 minutes  Maddon Horton L Hattye Siegfried, PA-C 04/10/23

## 2023-04-09 ENCOUNTER — Encounter: Payer: Self-pay | Admitting: Hematology and Oncology

## 2023-04-09 DIAGNOSIS — H04121 Dry eye syndrome of right lacrimal gland: Secondary | ICD-10-CM | POA: Diagnosis not present

## 2023-04-09 DIAGNOSIS — H5711 Ocular pain, right eye: Secondary | ICD-10-CM | POA: Diagnosis not present

## 2023-04-10 ENCOUNTER — Inpatient Hospital Stay: Payer: Medicare PPO

## 2023-04-10 ENCOUNTER — Other Ambulatory Visit: Payer: Medicare PPO

## 2023-04-10 ENCOUNTER — Other Ambulatory Visit: Payer: Self-pay | Admitting: Physician Assistant

## 2023-04-10 ENCOUNTER — Ambulatory Visit: Payer: Medicare PPO | Admitting: Hematology and Oncology

## 2023-04-10 ENCOUNTER — Inpatient Hospital Stay: Payer: Medicare PPO | Admitting: Physician Assistant

## 2023-04-10 VITALS — BP 155/80 | HR 83

## 2023-04-10 DIAGNOSIS — C9000 Multiple myeloma not having achieved remission: Secondary | ICD-10-CM

## 2023-04-10 DIAGNOSIS — E538 Deficiency of other specified B group vitamins: Secondary | ICD-10-CM

## 2023-04-10 DIAGNOSIS — Z86718 Personal history of other venous thrombosis and embolism: Secondary | ICD-10-CM | POA: Diagnosis not present

## 2023-04-10 DIAGNOSIS — M8580 Other specified disorders of bone density and structure, unspecified site: Secondary | ICD-10-CM | POA: Diagnosis not present

## 2023-04-10 DIAGNOSIS — Z5112 Encounter for antineoplastic immunotherapy: Secondary | ICD-10-CM | POA: Diagnosis not present

## 2023-04-10 DIAGNOSIS — I739 Peripheral vascular disease, unspecified: Secondary | ICD-10-CM | POA: Diagnosis not present

## 2023-04-10 DIAGNOSIS — I10 Essential (primary) hypertension: Secondary | ICD-10-CM | POA: Diagnosis not present

## 2023-04-10 DIAGNOSIS — Z79899 Other long term (current) drug therapy: Secondary | ICD-10-CM | POA: Diagnosis not present

## 2023-04-10 DIAGNOSIS — Z800 Family history of malignant neoplasm of digestive organs: Secondary | ICD-10-CM | POA: Diagnosis not present

## 2023-04-10 LAB — CBC WITH DIFFERENTIAL (CANCER CENTER ONLY)
Abs Immature Granulocytes: 0 10*3/uL (ref 0.00–0.07)
Basophils Absolute: 0 10*3/uL (ref 0.0–0.1)
Basophils Relative: 1 %
Eosinophils Absolute: 0.1 10*3/uL (ref 0.0–0.5)
Eosinophils Relative: 4 %
HCT: 31.2 % — ABNORMAL LOW (ref 36.0–46.0)
Hemoglobin: 10.6 g/dL — ABNORMAL LOW (ref 12.0–15.0)
Immature Granulocytes: 0 %
Lymphocytes Relative: 38 %
Lymphs Abs: 1.2 10*3/uL (ref 0.7–4.0)
MCH: 30.2 pg (ref 26.0–34.0)
MCHC: 34 g/dL (ref 30.0–36.0)
MCV: 88.9 fL (ref 80.0–100.0)
Monocytes Absolute: 0.5 10*3/uL (ref 0.1–1.0)
Monocytes Relative: 16 %
Neutro Abs: 1.4 10*3/uL — ABNORMAL LOW (ref 1.7–7.7)
Neutrophils Relative %: 41 %
Platelet Count: 248 10*3/uL (ref 150–400)
RBC: 3.51 MIL/uL — ABNORMAL LOW (ref 3.87–5.11)
RDW: 14.1 % (ref 11.5–15.5)
WBC Count: 3.3 10*3/uL — ABNORMAL LOW (ref 4.0–10.5)
nRBC: 0 % (ref 0.0–0.2)

## 2023-04-10 LAB — LACTATE DEHYDROGENASE: LDH: 156 U/L (ref 98–192)

## 2023-04-10 LAB — CMP (CANCER CENTER ONLY)
ALT: 7 U/L (ref 0–44)
AST: 17 U/L (ref 15–41)
Albumin: 4.3 g/dL (ref 3.5–5.0)
Alkaline Phosphatase: 103 U/L (ref 38–126)
Anion gap: 7 (ref 5–15)
BUN: 12 mg/dL (ref 8–23)
CO2: 22 mmol/L (ref 22–32)
Calcium: 9.1 mg/dL (ref 8.9–10.3)
Chloride: 104 mmol/L (ref 98–111)
Creatinine: 0.65 mg/dL (ref 0.44–1.00)
GFR, Estimated: >60 mL/min (ref >60)
Glucose, Bld: 82 mg/dL (ref 70–99)
Potassium: 4.2 mmol/L (ref 3.5–5.1)
Sodium: 133 mmol/L — ABNORMAL LOW (ref 135–145)
Total Bilirubin: 0.3 mg/dL (ref 0.3–1.2)
Total Protein: 7.3 g/dL (ref 6.5–8.1)

## 2023-04-10 MED ORDER — ACETAMINOPHEN 325 MG PO TABS
650.0000 mg | ORAL_TABLET | Freq: Once | ORAL | Status: AC
  Administered 2023-04-10: 650 mg via ORAL
  Filled 2023-04-10: qty 2

## 2023-04-10 MED ORDER — CYANOCOBALAMIN 1000 MCG/ML IJ SOLN
1000.0000 ug | Freq: Once | INTRAMUSCULAR | Status: AC
  Administered 2023-04-10: 1000 ug via INTRAMUSCULAR
  Filled 2023-04-10: qty 1

## 2023-04-10 MED ORDER — DIPHENHYDRAMINE HCL 25 MG PO CAPS
50.0000 mg | ORAL_CAPSULE | Freq: Once | ORAL | Status: AC
  Administered 2023-04-10: 50 mg via ORAL
  Filled 2023-04-10: qty 2

## 2023-04-10 MED ORDER — DEXAMETHASONE 4 MG PO TABS
20.0000 mg | ORAL_TABLET | Freq: Once | ORAL | Status: AC
  Administered 2023-04-10: 20 mg via ORAL
  Filled 2023-04-10: qty 5

## 2023-04-10 MED ORDER — DARATUMUMAB-HYALURONIDASE-FIHJ 1800-30000 MG-UT/15ML ~~LOC~~ SOLN
1800.0000 mg | Freq: Once | SUBCUTANEOUS | Status: AC
  Administered 2023-04-10: 1800 mg via SUBCUTANEOUS
  Filled 2023-04-10: qty 15

## 2023-04-10 NOTE — Progress Notes (Signed)
Okay to treat w/ ANC of 1.4 today per C. Holiengetter, PA. Also okay to give oral Decadron and omit IV dose as written in Physician Communication under treatment plan.  1155 Pt states she does not wait for post observation period after treatment. No complaints. Pt ambulatory upon d/c.

## 2023-04-14 LAB — KAPPA/LAMBDA LIGHT CHAINS
Kappa free light chain: 6.2 mg/L (ref 3.3–19.4)
Kappa, lambda light chain ratio: 1.03 (ref 0.26–1.65)
Lambda free light chains: 6 mg/L (ref 5.7–26.3)

## 2023-04-15 LAB — MULTIPLE MYELOMA PANEL, SERUM
Albumin SerPl Elph-Mcnc: 3.9 g/dL (ref 2.9–4.4)
Albumin/Glob SerPl: 1.4 (ref 0.7–1.7)
Alpha 1: 0.3 g/dL (ref 0.0–0.4)
Alpha2 Glob SerPl Elph-Mcnc: 0.8 g/dL (ref 0.4–1.0)
B-Globulin SerPl Elph-Mcnc: 1.1 g/dL (ref 0.7–1.3)
Gamma Glob SerPl Elph-Mcnc: 0.8 g/dL (ref 0.4–1.8)
Globulin, Total: 2.9 g/dL (ref 2.2–3.9)
IgA: 13 mg/dL — ABNORMAL LOW (ref 64–422)
IgG (Immunoglobin G), Serum: 936 mg/dL (ref 586–1602)
IgM (Immunoglobulin M), Srm: 19 mg/dL — ABNORMAL LOW (ref 26–217)
M Protein SerPl Elph-Mcnc: 0.4 g/dL — ABNORMAL HIGH
Total Protein ELP: 6.8 g/dL (ref 6.0–8.5)

## 2023-04-15 IMAGING — US US EXTREM UP ARTERIAL SEG MULTIPLE*R*
1 series · 13 of 25 positions shown · non-contrast
Comparison: None.

CLINICAL DATA: Right upper extremity cyanosis



[Series 1: us extrem up arterial seg multiple*right* · 13 of 26 slices shown]
[im 1/26]
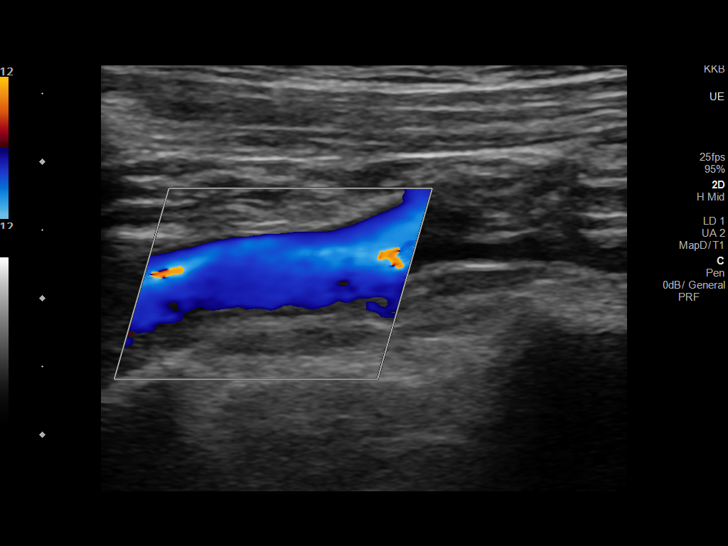
[im 3/26]
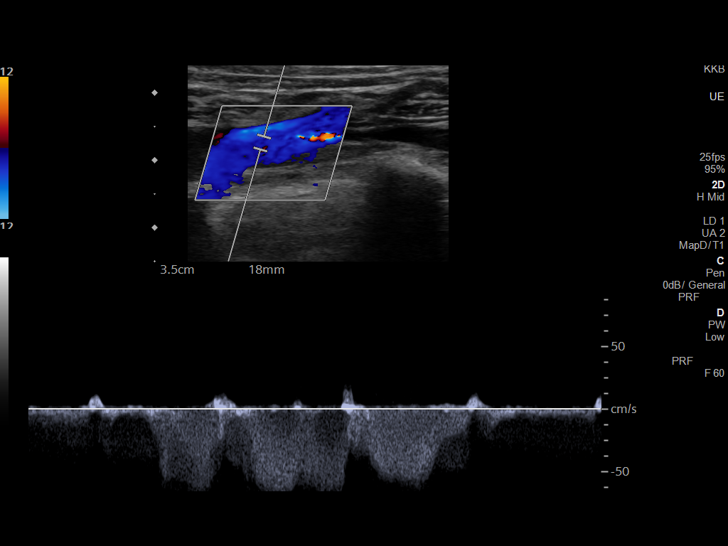
[im 5/26]
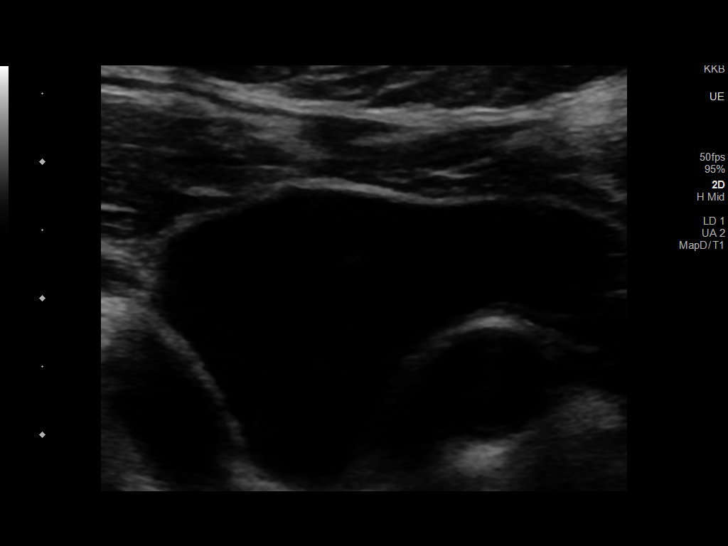
[im 7/26]
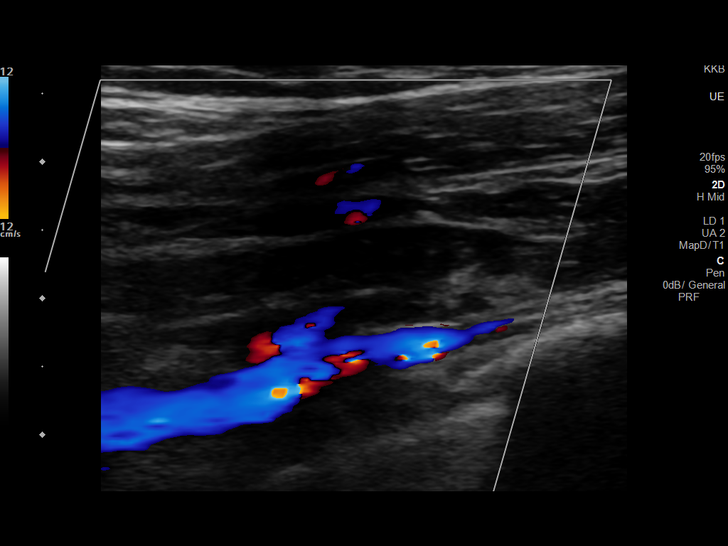
[im 9/26]
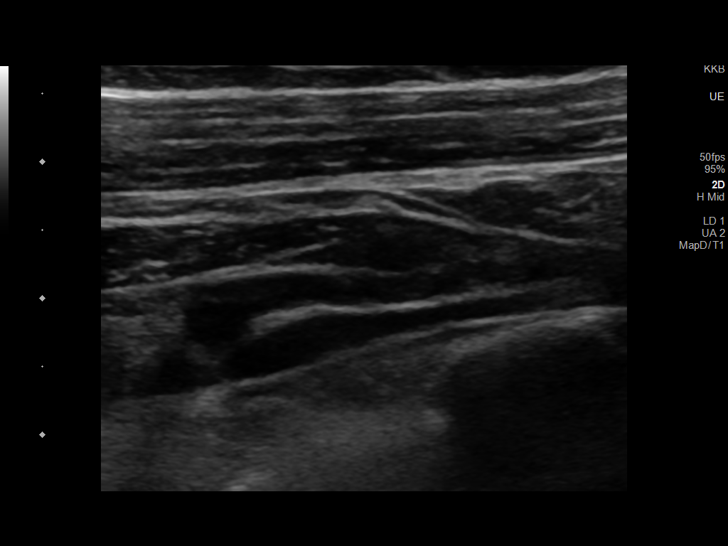
[im 11/26]
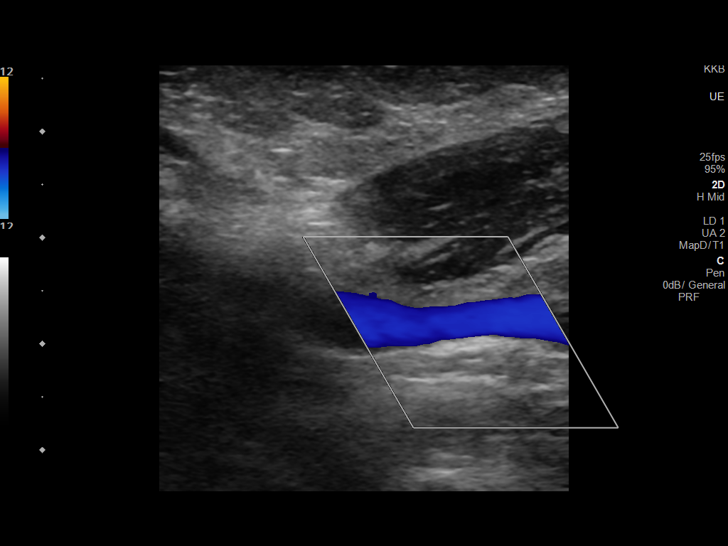
[im 13/26]
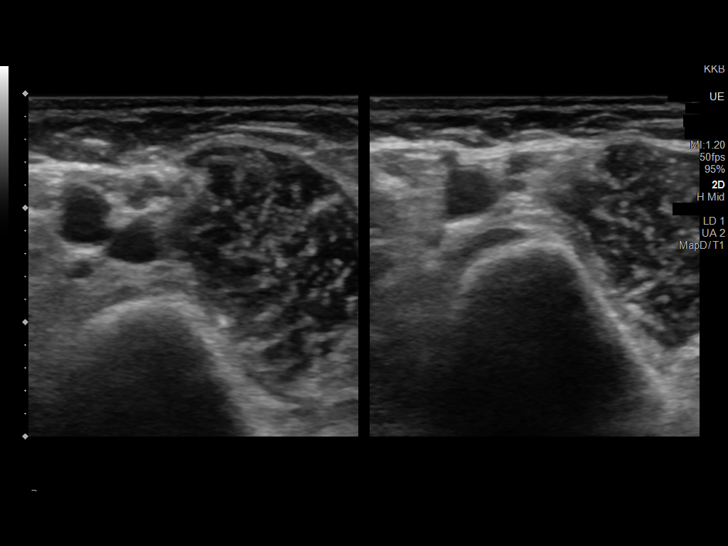
[im 15/26]
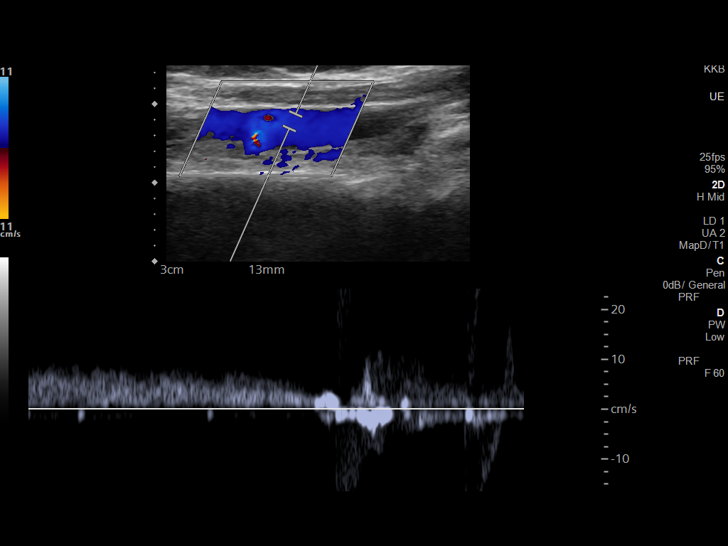
[im 17/26]
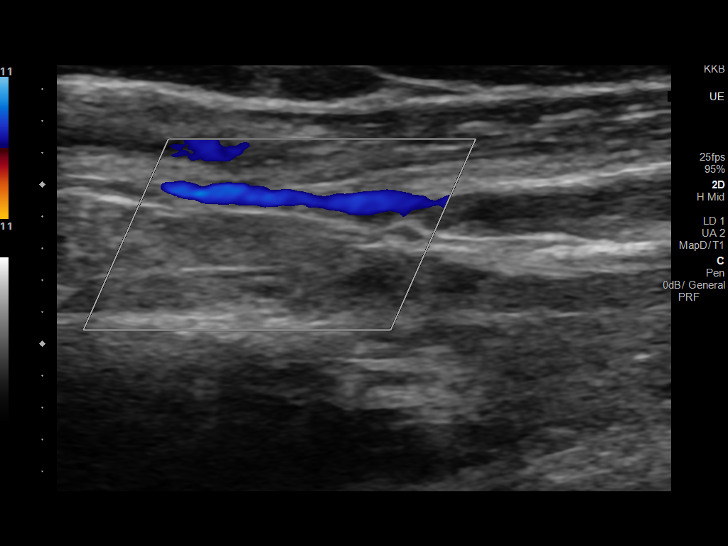
[im 19/26]
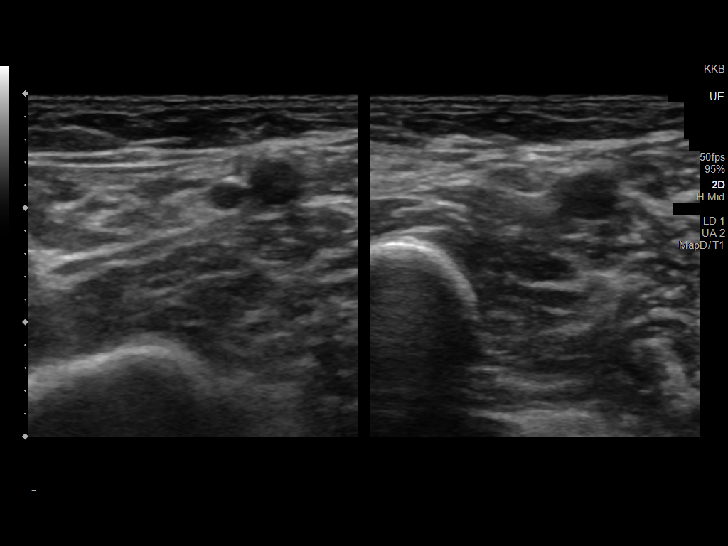
[im 21/26]
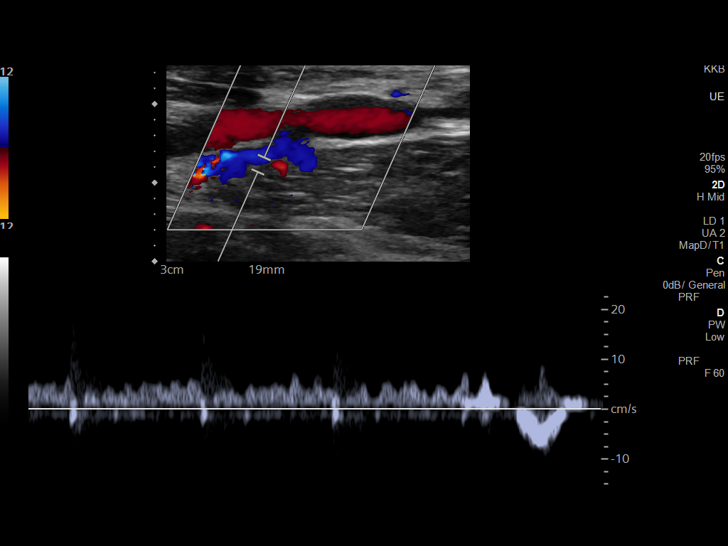
[im 23/26]
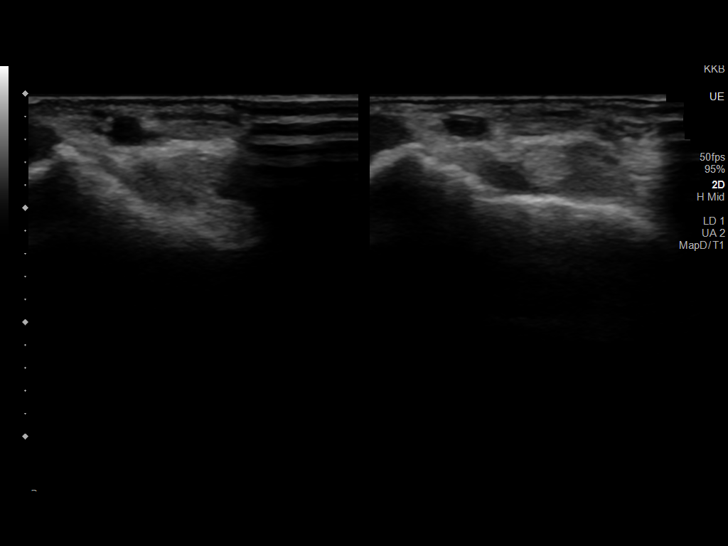
[im 26/26]
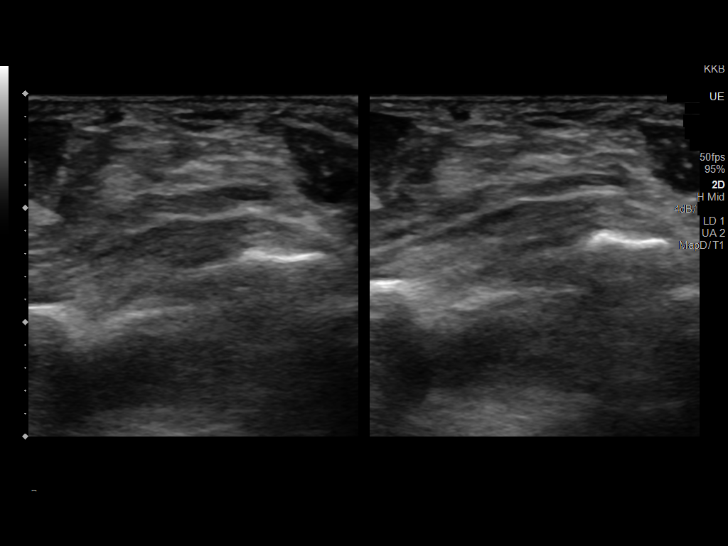

[13 of 25 positions shown; findings below may reference images not displayed]

FINDINGS: Contralateral Subclavian Vein: Respiratory phasicity is normal and
symmetric with the symptomatic side. No evidence of thrombus. Normal
compressibility.

Internal Jugular Vein: No evidence of thrombus. Normal
compressibility, respiratory phasicity and response to augmentation.

Subclavian Vein: No evidence of thrombus. Normal compressibility,
respiratory phasicity and response to augmentation.

Axillary Vein: No evidence of thrombus. Normal compressibility,
respiratory phasicity and response to augmentation.

Cephalic Vein: No evidence of thrombus.  Normal compressibility.

Basilic Vein: No evidence of thrombus. Normal compressibility,
respiratory phasicity and response to augmentation.

Brachial Veins: No evidence of thrombus. Normal compressibility,
respiratory phasicity and response to augmentation.

Radial Veins: No evidence of thrombus.  Normal compressibility.

Ulnar Veins: Not identified, possibly atretic or chronically
thrombosed.

Venous Reflux:  None visualized.

Other Findings:  None visualized.
IMPRESSION: No evidence of acute DVT within the right upper extremity.

Nonvisualization of the right ulnar veins within the forearm,
possibly atretic or chronically thrombosed.

## 2023-04-15 IMAGING — DX DG SHOULDER 2+V*R*
3 series · 3 of 3 positions shown · non-contrast
Comparison: 02/18/2013

CLINICAL DATA: Limited range of motion with sharp pain

EXAM:
RIGHT SHOULDER - 2+ VIEW

[shoulder grashey]
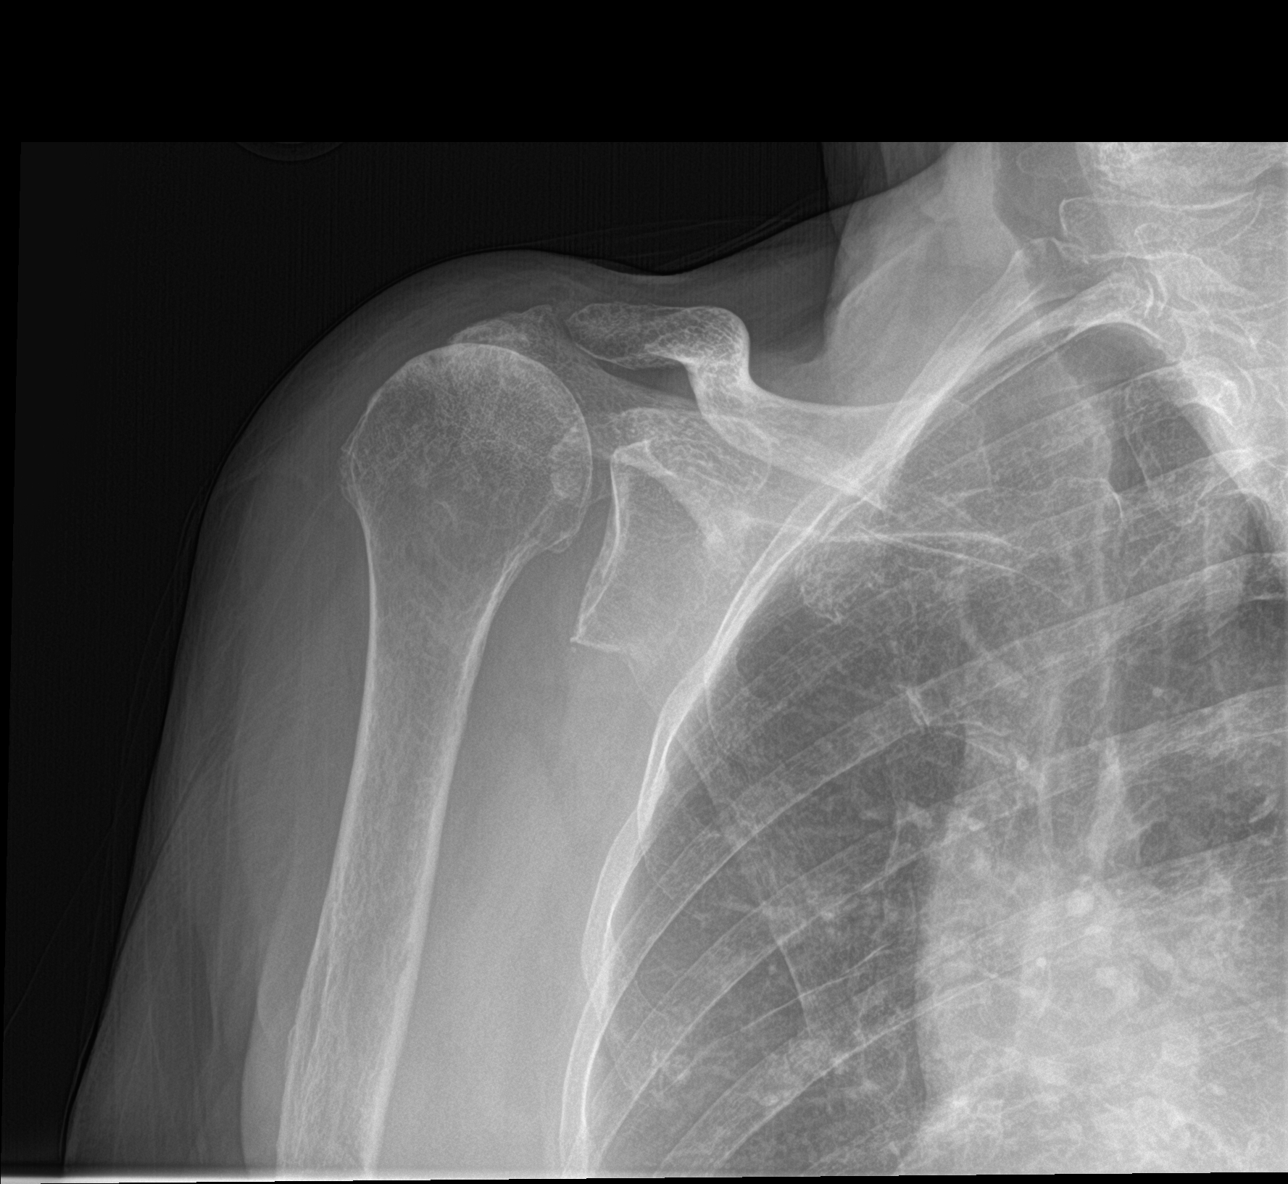

[shoulder ap neutral]
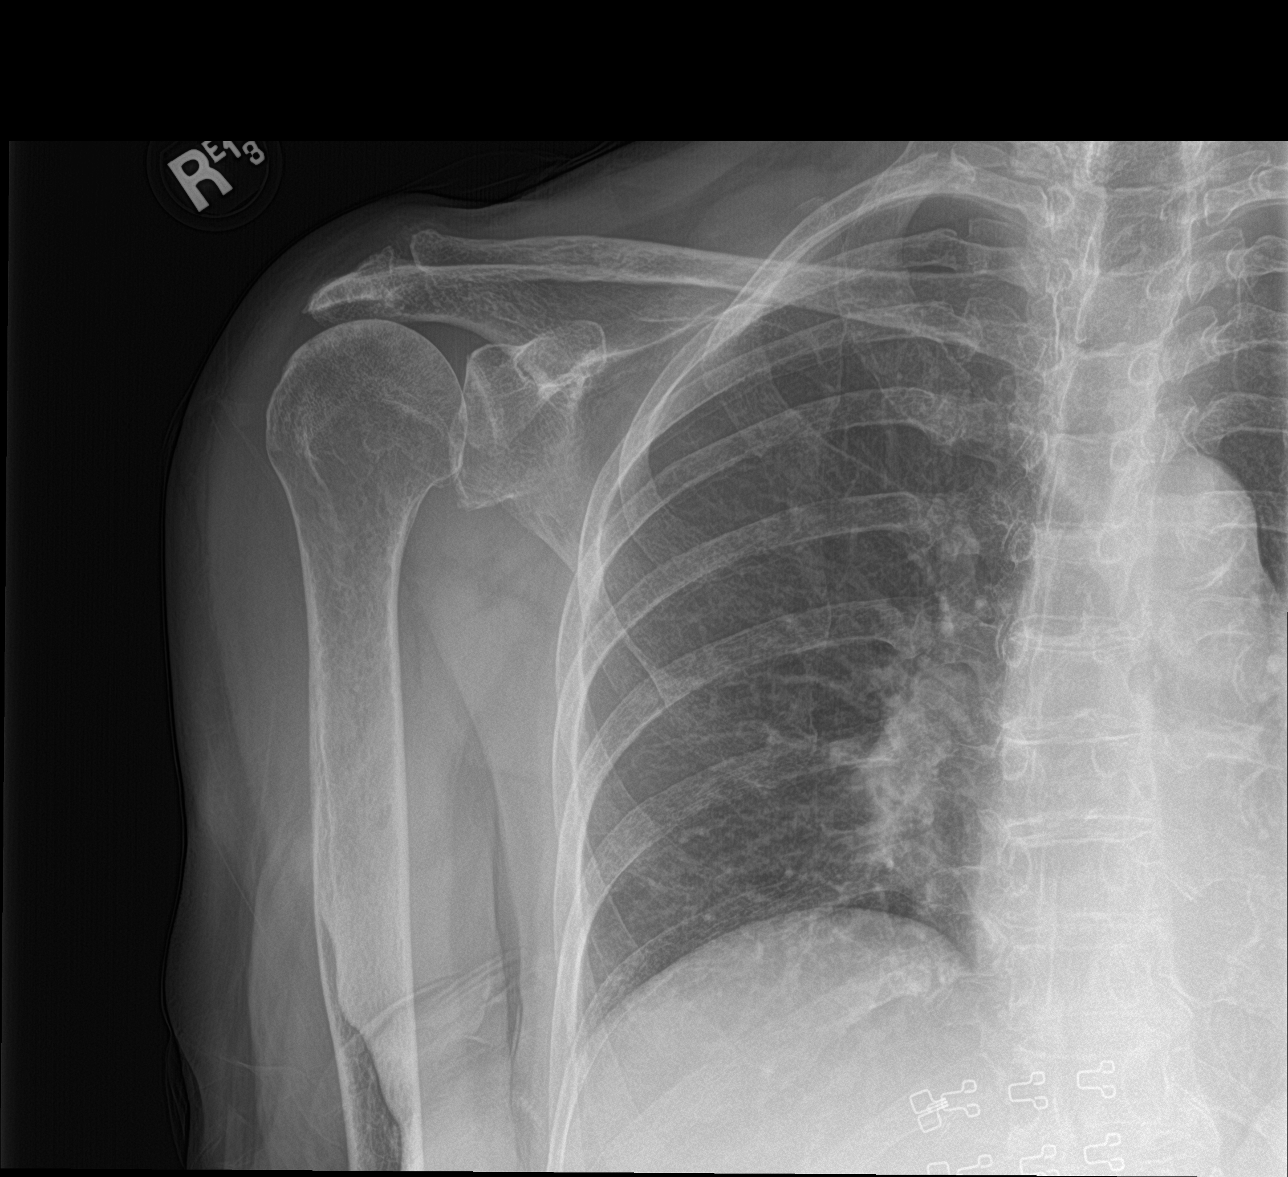

[shoulder y view]
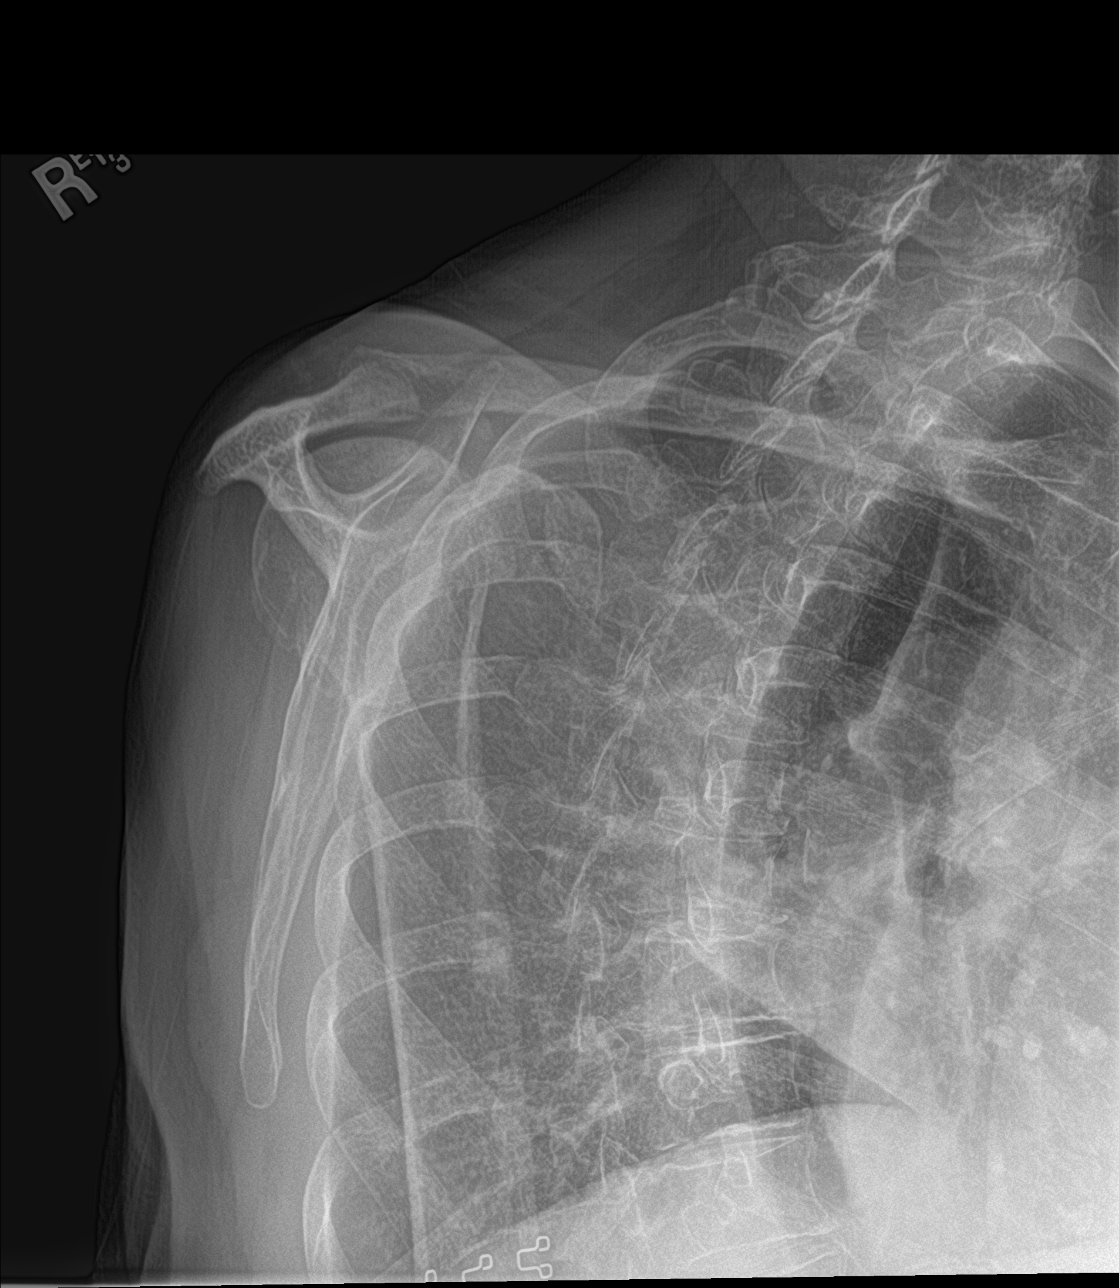

[3 of 3 positions shown; findings below may reference images not displayed]

FINDINGS: No anterior or posterior dislocation of the humeral head. Humeral
head is high-riding consistent with rotator cuff disease. Mild
degenerative changes at the AC joint and glenohumeral interval.
IMPRESSION: 1. No fracture or dislocation
2. Degenerative changes. High-riding appearance of humeral head,
consistent with rotator cuff disease

## 2023-04-20 ENCOUNTER — Other Ambulatory Visit: Payer: Self-pay | Admitting: Hematology and Oncology

## 2023-04-20 DIAGNOSIS — C9000 Multiple myeloma not having achieved remission: Secondary | ICD-10-CM

## 2023-04-20 NOTE — Progress Notes (Unsigned)
[facility] [phone]   [phone]  PROGRESS NOTE  Patient Care Team: Geoffry Paradise, MD as PCP - General (Internal Medicine)  Hematological/Oncological History # IgG Lambda Multiple Myeloma 06/04/2021: WBC 5.5, Hgb 11.1, MCV 101.3, Plt 306 07/24/2021: M protein 2.3, IFE shows IgG Lambda monoclonal specificity. Cr 0.9 08/07/2021: Establish care with Dr. Leonides Schanz 09/27/2021: Bmbx showed hypercellular bone marrow with plasma cell neoplasm, increased number of atypical plasma cells averaging 20% of all cells in the aspirate 10/18/2021: Cycle 1 of Velcade/Dexamethasone 11/08/2021: Cycle 2 of Velcade/Dexamethasone 11/29/2021: Cycle 3 of Velcade/Dexamethasone 12/20/2021: Cycle 4 of Velcade/Dexamethasone 01/10/2022: Cycle 5 of Velcade/Dexamethasone 01/31/2022: Cycle 6 of Velcade/Dexamethasone 02/28/2022: Cycle 7 of Velcade/Dexamethasone. Dose reduced velcade due to neuropathy  03/14/2022: Cycle 1 Day 1 of Dara/Dex (velcade d/c due to neuropathy).  04/11/2022: Cycle 2 Day 1 of Dara/Dex 05/09/2022: Cycle 3 Day 1 of Dara/Dex 06/06/2022: Cycle 4 Day 1 of Dara/Dex 07/04/2022: Cycle 5 Day 1 of Dara/Dex 08/01/2022: Cycle 6 Day 1 of Dara/Dex 08/29/2022: Cycle 7 Day 1 of Dara/Dex 09/26/2022: Cycle 8 Day 1 of Dara/Dex 10/24/2022: Cycle 9 Day 1 of Dara/Dex 11/21/2022: Cycle 10 Day 1 of Dara/Dex 01/15/2023: Cycle 11 Day 1 of Dara/Dex 02/13/2023: Cycle 12 Day 1 of Dara/Dex 03/12/2023: Cycle 13 Day 1 of Dara/Dex 04/10/2023: Cycle 14 Day 1 of Dara/Rev/Dex (revlimid started due to rising M Protein 0.6)   Interval History:  [name] Page 89 y.o. female with medical history significant for  IgG lambda multiple myeloma who presents for a follow up visit. The patient's last visit was on 04/10/2023. In the interim, patient has had no major changes in her health.   On exam today [name] Page is accompanied by her husband.  She reports she started the Revlimid pills on 04/10/2023.  She notes  that she is tolerating them well and has not noticed any major side effects after she started taking them after her meals.  She reports that she had tried taking them before breakfast but it caused a "terrible reaction" where she was having difficulty with balance and upset stomach.  She notes taking them after food has much improved those symptoms.  Otherwise she reports her energy levels are good and her appetite is at baseline.  She does have some questions and concerns about financial issues and paperwork she has received in the mail which we will run past our documentation department.  She denies fevers, chills, night sweats, shortness of breath, chest pain or cough.  She has no other complaints. Rest of the 10 point ROS is below.  MEDICAL HISTORY:  Past Medical History:  Diagnosis Date   Alopecia 10/28/2016   Anginal pain (HCC)    Anxiety    Arthritis    Cancer (HCC)    Cervical spondylosis    C3-4 AND C4-5   Chest pain 07/28/2008   H/O, normal stress nuclear EF 78%   Chronic cough 07/12/2014   Collapsed lung 07/2010   being treated at [facility] - in left lung   Complication of anesthesia    Cough 07/30/2010   Qualifier: Diagnosis of  By: Marchelle Gearing MD, Murali     Cystocele 05/23/2015   Cystocele, midline 05/02/2013   DEEP VEIN THROMBOSIS/PHLEBITIS 07/30/2010   Qualifier: History of  By: Yancey Flemings CMA, Jennifer     Dislocation closed, shoulder 02/2013   DVT (deep venous thrombosis) (HCC) 2006   Encounter for therapeutic drug monitoring 10/03/2014   GERD (gastroesophageal reflux disease)    occ  Headache    hx   High cholesterol    History of recurrent UTIs 09/24/2011   HYPERLIPIDEMIA 07/30/2010   Qualifier: Diagnosis of  By: Yancey Flemings CMA, Jennifer     Hypertension    HYPERTENSION 07/30/2010   Qualifier: Diagnosis of  By: Yancey Flemings CMA, Jennifer     Hypothyroidism    Nasal itching 01/30/2014   Osteopenia 05/23/2015   Peripheral vascular disease (HCC) 2006   dvt's and 50 yrs  ago   Pleuritic pain 12/17/2015   PONV (postoperative nausea and vomiting)    Primary localized osteoarthritis of right hip 08/26/2016   Primary osteoarthritis of right hip 08/26/2016   Pulmonary collapse 07/30/2010   Qualifier: Diagnosis of  By: Marchelle Gearing MD, Murali     PULMONARY NODULE 07/30/2010   Qualifier: Diagnosis of  By: Marchelle Gearing MD, Murali     Rash and nonspecific skin eruption 01/02/2015   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   Sarcoidosis    Sarcoidosis of lung (HCC)    Vaginal atrophy 09/24/2011   Visual field defect nasal step 01/02/2015    SURGICAL HISTORY: Past Surgical History:  Procedure Laterality Date   BACK SURGERY  2022   EYE SURGERY     FOOT SURGERY Left 06/2005   bunion hammer toe   HEMORRHOID SURGERY  12/1972   INTRAOCULAR LENS INSERTION     right eye 10 /16 and left 06/16/2005   JOINT REPLACEMENT     KYPHOPLASTY N/A 06/27/2021   Procedure: LUMBAR 3 AND LUMBAR  5 KYPHOPLASTY;  Surgeon: Estill Bamberg, MD;  Location: [facility] OR;  Service: Orthopedics;  Laterality: N/A;   KYPHOPLASTY N/A 09/04/2021   Procedure: THORACIC SEVEN, THORACIC EIGHT, THORACIC NINE, THORACIC TEN KYPHOPLASTY;  Surgeon: Estill Bamberg, MD;  Location: [facility] OR;  Service: Orthopedics;  Laterality: N/A;   REVERSE SHOULDER ARTHROPLASTY Left 12/05/2022   Procedure: REVERSE SHOULDER ARTHROPLASTY;  Surgeon: Beverely Low, MD;  Location: [facility] OR[facility];  Service: Orthopedics;  Laterality: Left;  CHOICE WITH INTERSCALENE BLOCK   SKIN BIOPSY  01/2007   basal cell carcinoma removed from right lower leg    TOTAL HIP ARTHROPLASTY  06/2007   left   TOTAL HIP ARTHROPLASTY Right 08/26/2016   Procedure: TOTAL HIP ARTHROPLASTY ANTERIOR APPROACH;  Surgeon: Marcene Corning, MD;  Location: [facility] OR;  Service: Orthopedics;  Laterality: Right;   TOTAL KNEE ARTHROPLASTY Right 10/2007   right    SOCIAL HISTORY: Social History   Socioeconomic History   Marital status: Married    Spouse name: Not on file   Number of  children: Not on file   Years of education: Not on file   Highest education level: Not on file  Occupational History   Occupation: retired    Associate Professor: RETIRED  Tobacco Use   Smoking status: Never   Smokeless tobacco: Never  Vaping Use   Vaping status: Never Used  Substance and Sexual Activity   Alcohol use: Not Currently    Comment: BEER/glass of wine A DAY   Drug use: No   Sexual activity: Never  Other Topics Concern   Not on file  Social History Narrative   Not on file   Social Determinants of Health   Financial Resource Strain: Not on file  Food Insecurity: No Food Insecurity (12/05/2022)   Hunger Vital Sign    Worried About Running Out of Food in the Last Year: Never true    Ran Out of Food in the Last Year: Never true  Transportation Needs: No Transportation Needs (  12/05/2022)   PRAPARE - Administrator, Civil Service (Medical): No    Lack of Transportation (Non-Medical): No  Physical Activity: Not on file  Stress: Not on file  Social Connections: Not on file  Intimate Partner Violence: Not At Risk (12/05/2022)   Humiliation, Afraid, Rape, and Kick questionnaire    Fear of Current or Ex-Partner: No    Emotionally Abused: No    Physically Abused: No    Sexually Abused: No    FAMILY HISTORY: Family History  Problem Relation Age of Onset   Diabetes Mother    Heart failure Mother    Hypertension Father    Diabetes Father    Heart disease Father    Heart attack Father     ALLERGIES:  has No Known Allergies.  MEDICATIONS:  Current Outpatient Medications  Medication Sig Dispense Refill   acyclovir (ZOVIRAX) 400 MG tablet TAKE ONE (1) TABLET BY MOUTH TWO (2) TIMES DAILY 60 tablet 2   amLODipine (NORVASC) 10 MG tablet Take 10 mg by mouth daily.     b complex vitamins capsule Take 1 capsule by mouth daily.     furosemide (LASIX) 20 MG tablet Take 20 mg by mouth daily as needed for edema.     gemfibrozil (LOPID) 600 MG tablet Take 600 mg by mouth 2  (two) times daily before a meal.     HYDROcodone-acetaminophen (NORCO/VICODIN) 5-325 MG tablet TAKE 1 TABLET BY MOUTH EVERY 6 HOURS AS NEEDED FOR MODERATE PAIN 60 tablet 0   lenalidomide (REVLIMID) 10 MG capsule Take 1 capsule (10 mg total) by mouth daily. Take for 21 days, then none for 7 days. Repeat every 28 days. [employer] Auth # [id number]; Date Obtained 04/01/23 21 capsule 0   levothyroxine (SYNTHROID) 50 MCG tablet Take 50 mcg by mouth daily.     LORazepam (ATIVAN) 1 MG tablet Take 0.5 mg by mouth at bedtime as needed for anxiety.     losartan (COZAAR) 50 MG tablet Take 50 mg by mouth daily.     Multiple Vitamin (MULTIVITAMIN) capsule Take 1 capsule by mouth daily.     naproxen sodium (ALEVE) 220 MG tablet Take 440 mg by mouth daily as needed (pain).     nitrofurantoin (MACRODANTIN) 100 MG capsule daily.     pantoprazole (PROTONIX) 20 MG tablet Take 40 mg by mouth daily at 12 noon.     polyethylene glycol powder (GLYCOLAX/MIRALAX) 17 GM/SCOOP powder Take 17 g by mouth at bedtime.     pregabalin (LYRICA) 25 MG capsule Take 1 capsule (25 mg total) by mouth 2 (two) times daily. 60 capsule 3   trimethoprim (TRIMPEX) 100 MG tablet Take 100 mg by mouth daily.     No current facility-administered medications for this visit.    REVIEW OF SYSTEMS:   Constitutional: ( - ) fevers, ( - )  chills , ( - ) night sweats Eyes: ( - ) blurriness of vision, ( - ) double vision, ( - ) watery eyes Ears, nose, mouth, throat, and face: ( - ) mucositis, ( - ) sore throat Respiratory: ( - ) cough, ( - ) dyspnea, ( - ) wheezes Cardiovascular: ( - ) palpitation, ( - ) chest discomfort, ( - ) lower extremity swelling Gastrointestinal:  ( - ) nausea, ( - ) heartburn, ( - ) change in bowel habits Skin: ( - ) abnormal skin rashes Lymphatics: ( - ) new lymphadenopathy, ( - ) easy bruising Neurological: (+ ) numbness, ( +)  tingling, ( - ) new weaknesses Behavioral/Psych: ( - ) mood change, ( - ) new changes  All other  systems were reviewed with the patient and are negative.  PHYSICAL EXAMINATION: ECOG PERFORMANCE STATUS: 1 - Symptomatic but completely ambulatory  Vitals:   04/21/23 1029  BP: (!) 148/71  Pulse: 65  Resp: 13  Temp: 97.8 F (36.6 C)  SpO2: 100%        Filed Weights   04/21/23 1029  Weight: 109 lb 14.4 oz (49.9 kg)       GENERAL: Well-appearing elderly Caucasian female, alert, no distress and comfortable SKIN: skin color, texture, turgor are normal, no rashes or significant lesions EYES: conjunctiva are pink and non-injected, sclera clear LUNGS: clear to auscultation and percussion with normal breathing effort HEART: regular rate & rhythm and no murmurs and no lower extremity edema Musculoskeletal: no cyanosis of digits and no clubbing  PSYCH: alert & oriented x 3, fluent speech NEURO: no focal motor/sensory deficits  LABORATORY DATA:  I have reviewed the data as listed    Latest Ref Rng & Units 04/21/2023    9:58 AM 04/10/2023    8:36 AM 03/12/2023    9:59 AM  CBC  WBC 4.0 - 10.5 K/uL 5.7  3.3  3.3   Hemoglobin 12.0 - 15.0 g/dL 9.6  16.1  09.6   Hematocrit 36.0 - 46.0 % 28.5  31.2  29.2   Platelets 150 - 400 K/uL 203  248  281        Latest Ref Rng & Units 04/21/2023    9:58 AM 04/10/2023    8:36 AM 03/12/2023    9:59 AM  CMP  Glucose 70 - 99 mg/dL 98  82  045   BUN 8 - 23 mg/dL 10  12  20    Creatinine 0.44 - 1.00 mg/dL 4.09  8.11  9.14   Sodium 135 - 145 mmol/L 135  133  136   Potassium 3.5 - 5.1 mmol/L 4.2  4.2  4.1   Chloride 98 - 111 mmol/L 106  104  107   CO2 22 - 32 mmol/L 24  22  20    Calcium 8.9 - 10.3 mg/dL 8.8  9.1  8.9   Total Protein 6.5 - 8.1 g/dL 6.7  7.3  6.8   Total Bilirubin 0.3 - 1.2 mg/dL 0.5  0.3  0.3   Alkaline Phos 38 - 126 U/L 79  103  94   AST 15 - 41 U/L 15  17  16    ALT 0 - 44 U/L 7  7  6      Lab Results  Component Value Date   MPROTEIN 0.4 (H) 04/10/2023   MPROTEIN 0.6 (H) 03/12/2023   MPROTEIN 0.4 (H) 02/13/2023   Lab  Results  Component Value Date   KPAFRELGTCHN 6.2 04/10/2023   KPAFRELGTCHN 7.0 03/12/2023   KPAFRELGTCHN 6.5 02/13/2023   LAMBDASER 6.0 04/10/2023   LAMBDASER 5.6 (L) 03/12/2023   LAMBDASER 7.5 02/13/2023   KAPLAMBRATIO 1.03 04/10/2023   KAPLAMBRATIO 1.25 03/12/2023   KAPLAMBRATIO 0.87 02/13/2023   RADIOGRAPHIC STUDIES: No results found.  ASSESSMENT & PLAN SHERHONDA MICKIEWICZ 89 y.o. female with medical history significant for newly diagnosed IgG lambda multiple myeloma who presents for a follow up visit.  #IgG Lambda Multiple Myeloma -- Diagnosis confirmed by bone marrow biopsy on 09/27/2021 which shows evidence of 20% involvement by plasma cells.  Additionally has anemia, macrocytosis, leukopenia, and concern for lytic lesions on bone imaging --  Prognostic panel shows a deletion 17 with a TP53 mutation --Started Cycle 1 Day 1 of Velcade/Dex on 10/18/2021 --Switched to Dara/Dex on 03/14/2022 due to neuropathy secondary to Velcade.  Plan: --Due for Cycle 13 Day 1 of Dara/Rev/Dex today  --Labs from 04/10/2023 showed M protein 0.4, Kappa 6.2, Lambda 6.0, K/L ratio 1.03 --Labs today show white blood cell count 5.7, hemoglobin 9.6, MCV 89.3, and platelets of 203, Creatinine and LFTs normal.  --RTC in 2 weeks with labs, follow up visit and Dara/Rev/Dex treatments.   # H/O Confusion --decreased dexamethasone to 20 mg on infusion days.  --Symptoms resolved.   #Leukopenia/Macrocytic anemia/Vitamin B12 deficiency: --Secondary to chemotherapy and multiple myeloma --Received vitamin B12 injection 1000 mcg weekly x 4 doses. Continue q 4 weeks.  -- continue p.o. supplementation.  #Right rib pain, improved: --CT chest from 10/25/2021 that didn't show any new or progressive disease.   #Abdominal cramping--resolved: --Symptoms occur after velcade injection.  --Has Bentyl as needed  #Neuropathy: --Grade 2 involving finger tips and feet --Secondary to velcade  --Currently on Lyrica 25 mg PO BID.    #Supportive Care -- chemotherapy education complete -- port placement not required.  -- zofran 8mg  q8H PRN and compazine 10mg  PO q6H for nausea -- acyclovir 400mg  PO BID for VCZ prophylaxis. Patient was not taking medication as prescribed.  -- Await dental clearance to start Zometa  No orders of the defined types were placed in this encounter.  All questions were answered. The patient knows to call the clinic with any problems, questions or concerns.  I have spent a total of 30 minutes minutes of face-to-face and non-face-to-face time, preparing to see the patient, performing a medically appropriate examination, counseling and educating the patient, ordering medications, documenting clinical information in the electronic health record,  and care coordination.   Ulysees Barns, MD Department of Hematology/Oncology [facility] Phone: [phone] Pager: [phone] Email: [email or web address]  04/21/2023 1:12 PM

## 2023-04-21 ENCOUNTER — Inpatient Hospital Stay: Payer: Medicare PPO | Admitting: Hematology and Oncology

## 2023-04-21 ENCOUNTER — Inpatient Hospital Stay: Payer: Medicare PPO | Attending: Hematology and Oncology

## 2023-04-21 VITALS — BP 148/71 | HR 65 | Temp 97.8°F | Resp 13 | Wt 109.9 lb

## 2023-04-21 DIAGNOSIS — D649 Anemia, unspecified: Secondary | ICD-10-CM

## 2023-04-21 DIAGNOSIS — Z800 Family history of malignant neoplasm of digestive organs: Secondary | ICD-10-CM | POA: Insufficient documentation

## 2023-04-21 DIAGNOSIS — G620 Drug-induced polyneuropathy: Secondary | ICD-10-CM | POA: Insufficient documentation

## 2023-04-21 DIAGNOSIS — T451X5A Adverse effect of antineoplastic and immunosuppressive drugs, initial encounter: Secondary | ICD-10-CM | POA: Insufficient documentation

## 2023-04-21 DIAGNOSIS — Z79899 Other long term (current) drug therapy: Secondary | ICD-10-CM | POA: Insufficient documentation

## 2023-04-21 DIAGNOSIS — Z5112 Encounter for antineoplastic immunotherapy: Secondary | ICD-10-CM | POA: Diagnosis not present

## 2023-04-21 DIAGNOSIS — C9000 Multiple myeloma not having achieved remission: Secondary | ICD-10-CM

## 2023-04-21 DIAGNOSIS — E538 Deficiency of other specified B group vitamins: Secondary | ICD-10-CM | POA: Insufficient documentation

## 2023-04-21 DIAGNOSIS — D6481 Anemia due to antineoplastic chemotherapy: Secondary | ICD-10-CM | POA: Insufficient documentation

## 2023-04-21 DIAGNOSIS — Z7961 Long term (current) use of immunomodulator: Secondary | ICD-10-CM | POA: Insufficient documentation

## 2023-04-21 DIAGNOSIS — Z86718 Personal history of other venous thrombosis and embolism: Secondary | ICD-10-CM | POA: Insufficient documentation

## 2023-04-21 DIAGNOSIS — I739 Peripheral vascular disease, unspecified: Secondary | ICD-10-CM | POA: Insufficient documentation

## 2023-04-21 DIAGNOSIS — M8580 Other specified disorders of bone density and structure, unspecified site: Secondary | ICD-10-CM | POA: Insufficient documentation

## 2023-04-21 DIAGNOSIS — I10 Essential (primary) hypertension: Secondary | ICD-10-CM | POA: Insufficient documentation

## 2023-04-21 LAB — CBC WITH DIFFERENTIAL (CANCER CENTER ONLY)
Abs Immature Granulocytes: 0.02 10*3/uL (ref 0.00–0.07)
Basophils Absolute: 0 10*3/uL (ref 0.0–0.1)
Basophils Relative: 1 %
Eosinophils Absolute: 0.2 10*3/uL (ref 0.0–0.5)
Eosinophils Relative: 3 %
HCT: 28.5 % — ABNORMAL LOW (ref 36.0–46.0)
Hemoglobin: 9.6 g/dL — ABNORMAL LOW (ref 12.0–15.0)
Immature Granulocytes: 0 %
Lymphocytes Relative: 21 %
Lymphs Abs: 1.2 10*3/uL (ref 0.7–4.0)
MCH: 30.1 pg (ref 26.0–34.0)
MCHC: 33.7 g/dL (ref 30.0–36.0)
MCV: 89.3 fL (ref 80.0–100.0)
Monocytes Absolute: 0.7 10*3/uL (ref 0.1–1.0)
Monocytes Relative: 12 %
Neutro Abs: 3.6 10*3/uL (ref 1.7–7.7)
Neutrophils Relative %: 63 %
Platelet Count: 203 10*3/uL (ref 150–400)
RBC: 3.19 MIL/uL — ABNORMAL LOW (ref 3.87–5.11)
RDW: 14.6 % (ref 11.5–15.5)
WBC Count: 5.7 10*3/uL (ref 4.0–10.5)
nRBC: 0 % (ref 0.0–0.2)

## 2023-04-21 LAB — CMP (CANCER CENTER ONLY)
ALT: 7 U/L (ref 0–44)
AST: 15 U/L (ref 15–41)
Albumin: 4 g/dL (ref 3.5–5.0)
Alkaline Phosphatase: 79 U/L (ref 38–126)
Anion gap: 5 (ref 5–15)
BUN: 10 mg/dL (ref 8–23)
CO2: 24 mmol/L (ref 22–32)
Calcium: 8.8 mg/dL — ABNORMAL LOW (ref 8.9–10.3)
Chloride: 106 mmol/L (ref 98–111)
Creatinine: 0.79 mg/dL (ref 0.44–1.00)
GFR, Estimated: >60 mL/min (ref >60)
Glucose, Bld: 98 mg/dL (ref 70–99)
Potassium: 4.2 mmol/L (ref 3.5–5.1)
Sodium: 135 mmol/L (ref 135–145)
Total Bilirubin: 0.5 mg/dL (ref 0.3–1.2)
Total Protein: 6.7 g/dL (ref 6.5–8.1)

## 2023-04-22 DIAGNOSIS — L720 Epidermal cyst: Secondary | ICD-10-CM | POA: Diagnosis not present

## 2023-04-26 ENCOUNTER — Other Ambulatory Visit: Payer: Self-pay | Admitting: Hematology and Oncology

## 2023-04-26 DIAGNOSIS — C9000 Multiple myeloma not having achieved remission: Secondary | ICD-10-CM

## 2023-04-28 ENCOUNTER — Other Ambulatory Visit: Payer: Self-pay | Admitting: *Deleted

## 2023-04-28 DIAGNOSIS — C9000 Multiple myeloma not having achieved remission: Secondary | ICD-10-CM

## 2023-04-28 MED ORDER — LENALIDOMIDE 10 MG PO CAPS
10.0000 mg | ORAL_CAPSULE | Freq: Every day | ORAL | 0 refills | Status: DC
Start: 2023-04-28 — End: 2023-05-25

## 2023-05-08 ENCOUNTER — Inpatient Hospital Stay: Payer: Medicare PPO

## 2023-05-08 ENCOUNTER — Inpatient Hospital Stay: Payer: Medicare PPO | Admitting: Hematology and Oncology

## 2023-05-08 VITALS — BP 174/63 | HR 64 | Temp 97.8°F | Resp 13 | Wt 112.0 lb

## 2023-05-08 DIAGNOSIS — E538 Deficiency of other specified B group vitamins: Secondary | ICD-10-CM | POA: Diagnosis not present

## 2023-05-08 DIAGNOSIS — Z7961 Long term (current) use of immunomodulator: Secondary | ICD-10-CM | POA: Diagnosis not present

## 2023-05-08 DIAGNOSIS — C9000 Multiple myeloma not having achieved remission: Secondary | ICD-10-CM

## 2023-05-08 DIAGNOSIS — Z79899 Other long term (current) drug therapy: Secondary | ICD-10-CM | POA: Diagnosis not present

## 2023-05-08 DIAGNOSIS — Z5112 Encounter for antineoplastic immunotherapy: Secondary | ICD-10-CM | POA: Diagnosis not present

## 2023-05-08 DIAGNOSIS — D6481 Anemia due to antineoplastic chemotherapy: Secondary | ICD-10-CM | POA: Diagnosis not present

## 2023-05-08 DIAGNOSIS — G620 Drug-induced polyneuropathy: Secondary | ICD-10-CM | POA: Diagnosis not present

## 2023-05-08 DIAGNOSIS — Z86718 Personal history of other venous thrombosis and embolism: Secondary | ICD-10-CM | POA: Diagnosis not present

## 2023-05-08 DIAGNOSIS — T451X5A Adverse effect of antineoplastic and immunosuppressive drugs, initial encounter: Secondary | ICD-10-CM | POA: Diagnosis not present

## 2023-05-08 LAB — CBC WITH DIFFERENTIAL (CANCER CENTER ONLY)
Abs Immature Granulocytes: 0.01 10*3/uL (ref 0.00–0.07)
Basophils Absolute: 0.1 10*3/uL (ref 0.0–0.1)
Basophils Relative: 3 %
Eosinophils Absolute: 0.2 10*3/uL (ref 0.0–0.5)
Eosinophils Relative: 6 %
HCT: 29.9 % — ABNORMAL LOW (ref 36.0–46.0)
Hemoglobin: 9.9 g/dL — ABNORMAL LOW (ref 12.0–15.0)
Immature Granulocytes: 0 %
Lymphocytes Relative: 36 %
Lymphs Abs: 1.4 10*3/uL (ref 0.7–4.0)
MCH: 30.2 pg (ref 26.0–34.0)
MCHC: 33.1 g/dL (ref 30.0–36.0)
MCV: 91.2 fL (ref 80.0–100.0)
Monocytes Absolute: 0.8 10*3/uL (ref 0.1–1.0)
Monocytes Relative: 20 %
Neutro Abs: 1.4 10*3/uL — ABNORMAL LOW (ref 1.7–7.7)
Neutrophils Relative %: 35 %
Platelet Count: 291 10*3/uL (ref 150–400)
RBC: 3.28 MIL/uL — ABNORMAL LOW (ref 3.87–5.11)
RDW: 15.5 % (ref 11.5–15.5)
WBC Count: 3.9 10*3/uL — ABNORMAL LOW (ref 4.0–10.5)
nRBC: 0 % (ref 0.0–0.2)

## 2023-05-08 LAB — LACTATE DEHYDROGENASE: LDH: 174 U/L (ref 98–192)

## 2023-05-08 LAB — CMP (CANCER CENTER ONLY)
ALT: 10 U/L (ref 0–44)
AST: 20 U/L (ref 15–41)
Albumin: 4.2 g/dL (ref 3.5–5.0)
Alkaline Phosphatase: 97 U/L (ref 38–126)
Anion gap: 9 (ref 5–15)
BUN: 16 mg/dL (ref 8–23)
CO2: 22 mmol/L (ref 22–32)
Calcium: 9 mg/dL (ref 8.9–10.3)
Chloride: 103 mmol/L (ref 98–111)
Creatinine: 0.76 mg/dL (ref 0.44–1.00)
GFR, Estimated: >60 mL/min (ref >60)
Glucose, Bld: 90 mg/dL (ref 70–99)
Potassium: 3.7 mmol/L (ref 3.5–5.1)
Sodium: 134 mmol/L — ABNORMAL LOW (ref 135–145)
Total Bilirubin: 0.4 mg/dL (ref 0.3–1.2)
Total Protein: 7.5 g/dL (ref 6.5–8.1)

## 2023-05-08 MED ORDER — DEXAMETHASONE 4 MG PO TABS
20.0000 mg | ORAL_TABLET | Freq: Once | ORAL | Status: AC
  Administered 2023-05-08: 20 mg via ORAL
  Filled 2023-05-08: qty 5

## 2023-05-08 MED ORDER — CYANOCOBALAMIN 1000 MCG/ML IJ SOLN
1000.0000 ug | Freq: Once | INTRAMUSCULAR | Status: AC
  Administered 2023-05-08: 1000 ug via INTRAMUSCULAR
  Filled 2023-05-08: qty 1

## 2023-05-08 MED ORDER — ACETAMINOPHEN 325 MG PO TABS
650.0000 mg | ORAL_TABLET | Freq: Once | ORAL | Status: AC
  Administered 2023-05-08: 650 mg via ORAL
  Filled 2023-05-08: qty 2

## 2023-05-08 MED ORDER — DARATUMUMAB-HYALURONIDASE-FIHJ 1800-30000 MG-UT/15ML ~~LOC~~ SOLN
1800.0000 mg | Freq: Once | SUBCUTANEOUS | Status: AC
  Administered 2023-05-08: 1800 mg via SUBCUTANEOUS
  Filled 2023-05-08: qty 15

## 2023-05-08 MED ORDER — DIPHENHYDRAMINE HCL 25 MG PO CAPS
50.0000 mg | ORAL_CAPSULE | Freq: Once | ORAL | Status: AC
  Administered 2023-05-08: 50 mg via ORAL
  Filled 2023-05-08: qty 2

## 2023-05-08 NOTE — Patient Instructions (Signed)
[facility]  Discharge Instructions: Thank you for choosing [facility] to provide your oncology and hematology care.   If you have a lab appointment with the Cancer Center, please go directly to the Cancer Center and check in at the registration area.   Wear comfortable clothing and clothing appropriate for easy access to any Portacath or PICC line.   We strive to give you quality time with your provider. You may need to reschedule your appointment if you arrive late (15 or more minutes).  Arriving late affects you and other patients whose appointments are after yours.  Also, if you miss three or more appointments without notifying the office, you may be dismissed from the clinic at the provider's discretion.      For prescription refill requests, have your pharmacy contact our office and allow 72 hours for refills to be completed.    Today you received the following chemotherapy and/or immunotherapy agents Darzalex Faspro.      To help prevent nausea and vomiting after your treatment, we encourage you to take your nausea medication as directed.  BELOW ARE SYMPTOMS THAT SHOULD BE REPORTED IMMEDIATELY: *FEVER GREATER THAN 100.4 F (38 C) OR HIGHER *CHILLS OR SWEATING *NAUSEA AND VOMITING THAT IS NOT CONTROLLED WITH YOUR NAUSEA MEDICATION *UNUSUAL SHORTNESS OF BREATH *UNUSUAL BRUISING OR BLEEDING *URINARY PROBLEMS (pain or burning when urinating, or frequent urination) *BOWEL PROBLEMS (unusual diarrhea, constipation, pain near the anus) TENDERNESS IN MOUTH AND THROAT WITH OR WITHOUT PRESENCE OF ULCERS (sore throat, sores in mouth, or a toothache) UNUSUAL RASH, SWELLING OR PAIN  UNUSUAL VAGINAL DISCHARGE OR ITCHING   Items with * indicate a potential emergency and should be followed up as soon as possible or go to the Emergency Department if any problems should occur.  Please show the CHEMOTHERAPY ALERT CARD or IMMUNOTHERAPY ALERT CARD at  check-in to the Emergency Department and triage nurse.  Should you have questions after your visit or need to cancel or reschedule your appointment, please contact [facility]  Dept: [phone]  and follow the prompts.  Office hours are 8:00 a.m. to 4:30 p.m. Monday - Friday. Please note that voicemails left after 4:00 p.m. may not be returned until the following business day.  We are closed weekends and major holidays. You have access to a nurse at all times for urgent questions. Please call the main number to the clinic Dept: [phone] and follow the prompts.   For any non-urgent questions, you may also contact your provider using MyChart. We now offer e-Visits for anyone 78 and older to request care online for non-urgent symptoms. For details visit [email or web address].   Also download the MyChart app! Go to the app store, search "MyChart", open the app, select [facility], and log in with your MyChart username and password.  Vitamin B12 Injection What is this medication? Vitamin B12 (VAHY tuh min B12) prevents and treats low vitamin B12 levels in your body. It is used in people who do not get enough vitamin B12 from their diet or when their digestive tract does not absorb enough. Vitamin B12 plays an important role in maintaining the health of your nervous system and red blood cells. This medicine may be used for other purposes; ask your health care provider or pharmacist if you have questions. COMMON BRAND NAME(S): B-12 Compliance Kit, B-12 Injection Kit, Cyomin, Dodex, LA-12, Nutri-Twelve, Physicians EZ Use B-12, Primabalt, Vitamin Deficiency Injectable  System - B12 What should I tell my care team before I take this medication? They need to know if you have any of these conditions: Kidney disease Leber's disease Megaloblastic anemia An unusual or allergic reaction to cyanocobalamin, cobalt, other medications, foods, dyes, or  preservatives Pregnant or trying to get pregnant Breast-feeding How should I use this medication? This medication is injected into a muscle or deeply under the skin. It is usually given in a clinic or care team's office. However, your care team may teach you how to inject yourself. Follow all instructions. Talk to your care team about the use of this medication in children. Special care may be needed. Overdosage: If you think you have taken too much of this medicine contact a poison control center or emergency room at once. NOTE: This medicine is only for you. Do not share this medicine with others. What if I miss a dose? If you are given your dose at a clinic or care team's office, call to reschedule your appointment. If you give your own injections, and you miss a dose, take it as soon as you can. If it is almost time for your next dose, take only that dose. Do not take double or extra doses. What may interact with this medication? Alcohol Colchicine This list may not describe all possible interactions. Give your health care provider a list of all the medicines, herbs, non-prescription drugs, or dietary supplements you use. Also tell them if you smoke, drink alcohol, or use illegal drugs. Some items may interact with your medicine. What should I watch for while using this medication? Visit your care team regularly. You may need blood work done while you are taking this medication. You may need to follow a special diet. Talk to your care team. Limit your alcohol intake and avoid smoking to get the best benefit. What side effects may I notice from receiving this medication? Side effects that you should report to your care team as soon as possible: Allergic reactions--skin rash, itching, hives, swelling of the face, lips, tongue, or throat Swelling of the ankles, hands, or feet Trouble breathing Side effects that usually do not require medical attention (report to your care team if they continue  or are bothersome): Diarrhea This list may not describe all possible side effects. Call your doctor for medical advice about side effects. You may report side effects to FDA at 1-800-FDA-1088. Where should I keep my medication? Keep out of the reach of children. Store at room temperature between 15 and 30 degrees C (59 and 85 degrees F). Protect from light. Throw away any unused medication after the expiration date. NOTE: This sheet is a summary. It may not cover all possible information. If you have questions about this medicine, talk to your doctor, pharmacist, or health care provider.  2024 Elsevier/Gold Standard (2021-04-09 00:00:00)

## 2023-05-08 NOTE — Progress Notes (Signed)
Per Dr. Leonides Schanz, ok to treat with ANC of 1.4.

## 2023-05-08 NOTE — Progress Notes (Signed)
[facility] [phone]   [phone]  PROGRESS NOTE  Patient Care Team: Geoffry Paradise, MD as PCP - General (Internal Medicine)  Hematological/Oncological History # IgG Lambda Multiple Myeloma 06/04/2021: WBC 5.5, Hgb 11.1, MCV 101.3, Plt 306 07/24/2021: M protein 2.3, IFE shows IgG Lambda monoclonal specificity. Cr 0.9 08/07/2021: Establish care with Dr. Leonides Schanz 09/27/2021: Bmbx showed hypercellular bone marrow with plasma cell neoplasm, increased number of atypical plasma cells averaging 20% of all cells in the aspirate 10/18/2021: Cycle 1 of Velcade/Dexamethasone 11/08/2021: Cycle 2 of Velcade/Dexamethasone 11/29/2021: Cycle 3 of Velcade/Dexamethasone 12/20/2021: Cycle 4 of Velcade/Dexamethasone 01/10/2022: Cycle 5 of Velcade/Dexamethasone 01/31/2022: Cycle 6 of Velcade/Dexamethasone 02/28/2022: Cycle 7 of Velcade/Dexamethasone. Dose reduced velcade due to neuropathy  03/14/2022: Cycle 1 Day 1 of Dara/Dex (velcade d/c due to neuropathy).  04/11/2022: Cycle 2 Day 1 of Dara/Dex 05/09/2022: Cycle 3 Day 1 of Dara/Dex 06/06/2022: Cycle 4 Day 1 of Dara/Dex 07/04/2022: Cycle 5 Day 1 of Dara/Dex 08/01/2022: Cycle 6 Day 1 of Dara/Dex 08/29/2022: Cycle 7 Day 1 of Dara/Dex 09/26/2022: Cycle 8 Day 1 of Dara/Dex 10/24/2022: Cycle 9 Day 1 of Dara/Dex 11/21/2022: Cycle 10 Day 1 of Dara/Dex 01/15/2023: Cycle 11 Day 1 of Dara/Dex 02/13/2023: Cycle 12 Day 1 of Dara/Dex 03/12/2023: Cycle 13 Day 1 of Dara/Dex 04/10/2023: Cycle 14 Day 1 of Dara/Rev/Dex (revlimid started due to rising M Protein 0.6)  05/08/2023:  Cycle 15 Day 1 of Dara/Rev/Dex  Interval History:  [name] Page 89 y.o. female with medical history significant for  IgG lambda multiple myeloma who presents for a follow up visit. The patient's last visit was on 04/21/2023. In the interim, patient has had no major changes in her health.   On exam today [name] Page is accompanied by her husband.  She reports she has been  well overall in the interim since her last visit.  She reports that she is tolerating the Darzalex shots well and has not had any difficulty with the Revlimid.  She reports so far no side effects such as nausea, vomiting, or diarrhea.  She reports her energy has been neither up nor down.  She reports that she has a fair amount of energy early in the day but then wears off by dinnertime.  Received her flu shot and did get some swelling of the arm but tolerated it well otherwise and will be getting a COVID booster as well as RSV vaccine..  She denies fevers, chills, night sweats, shortness of breath, chest pain or cough.  She has no other complaints. Rest of the 10 point ROS is below.  MEDICAL HISTORY:  Past Medical History:  Diagnosis Date   Alopecia 10/28/2016   Anginal pain (HCC)    Anxiety    Arthritis    Cancer (HCC)    Cervical spondylosis    C3-4 AND C4-5   Chest pain 07/28/2008   H/O, normal stress nuclear EF 78%   Chronic cough 07/12/2014   Collapsed lung 07/2010   being treated at [facility] - in left lung   Complication of anesthesia    Cough 07/30/2010   Qualifier: Diagnosis of  By: Marchelle Gearing MD, Murali     Cystocele 05/23/2015   Cystocele, midline 05/02/2013   DEEP VEIN THROMBOSIS/PHLEBITIS 07/30/2010   Qualifier: History of  By: Yancey Flemings CMA, Jennifer     Dislocation closed, shoulder 02/2013   DVT (deep venous thrombosis) (HCC) 2006   Encounter for therapeutic drug monitoring 10/03/2014   GERD (gastroesophageal reflux disease)  occ   Headache    hx   High cholesterol    History of recurrent UTIs 09/24/2011   HYPERLIPIDEMIA 07/30/2010   Qualifier: Diagnosis of  By: Yancey Flemings CMA, Jennifer     Hypertension    HYPERTENSION 07/30/2010   Qualifier: Diagnosis of  By: Yancey Flemings CMA, Jennifer     Hypothyroidism    Nasal itching 01/30/2014   Osteopenia 05/23/2015   Peripheral vascular disease (HCC) 2006   dvt's and 50 yrs ago   Pleuritic pain 12/17/2015   PONV (postoperative  nausea and vomiting)    Primary localized osteoarthritis of right hip 08/26/2016   Primary osteoarthritis of right hip 08/26/2016   Pulmonary collapse 07/30/2010   Qualifier: Diagnosis of  By: Marchelle Gearing MD, Murali     PULMONARY NODULE 07/30/2010   Qualifier: Diagnosis of  By: Marchelle Gearing MD, Murali     Rash and nonspecific skin eruption 01/02/2015   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   Sarcoidosis    Sarcoidosis of lung (HCC)    Vaginal atrophy 09/24/2011   Visual field defect nasal step 01/02/2015    SURGICAL HISTORY: Past Surgical History:  Procedure Laterality Date   BACK SURGERY  2022   EYE SURGERY     FOOT SURGERY Left 06/2005   bunion hammer toe   HEMORRHOID SURGERY  12/1972   INTRAOCULAR LENS INSERTION     right eye 10 /16 and left 06/16/2005   JOINT REPLACEMENT     KYPHOPLASTY N/A 06/27/2021   Procedure: LUMBAR 3 AND LUMBAR  5 KYPHOPLASTY;  Surgeon: Estill Bamberg, MD;  Location: [facility] OR;  Service: Orthopedics;  Laterality: N/A;   KYPHOPLASTY N/A 09/04/2021   Procedure: THORACIC SEVEN, THORACIC EIGHT, THORACIC NINE, THORACIC TEN KYPHOPLASTY;  Surgeon: Estill Bamberg, MD;  Location: [facility] OR;  Service: Orthopedics;  Laterality: N/A;   REVERSE SHOULDER ARTHROPLASTY Left 12/05/2022   Procedure: REVERSE SHOULDER ARTHROPLASTY;  Surgeon: Beverely Low, MD;  Location: [facility] OR[facility];  Service: Orthopedics;  Laterality: Left;  CHOICE WITH INTERSCALENE BLOCK   SKIN BIOPSY  01/2007   basal cell carcinoma removed from right lower leg    TOTAL HIP ARTHROPLASTY  06/2007   left   TOTAL HIP ARTHROPLASTY Right 08/26/2016   Procedure: TOTAL HIP ARTHROPLASTY ANTERIOR APPROACH;  Surgeon: Marcene Corning, MD;  Location: [facility] OR;  Service: Orthopedics;  Laterality: Right;   TOTAL KNEE ARTHROPLASTY Right 10/2007   right    SOCIAL HISTORY: Social History   Socioeconomic History   Marital status: Married    Spouse name: Not on file   Number of children: Not on file   Years of education: Not on file    Highest education level: Not on file  Occupational History   Occupation: retired    Associate Professor: RETIRED  Tobacco Use   Smoking status: Never   Smokeless tobacco: Never  Vaping Use   Vaping status: Never Used  Substance and Sexual Activity   Alcohol use: Not Currently    Comment: BEER/glass of wine A DAY   Drug use: No   Sexual activity: Never  Other Topics Concern   Not on file  Social History Narrative   Not on file   Social Determinants of Health   Financial Resource Strain: Not on file  Food Insecurity: No Food Insecurity (12/05/2022)   Hunger Vital Sign    Worried About Running Out of Food in the Last Year: Never true    Ran Out of Food in the Last Year: Never true  Transportation Needs:  No Transportation Needs (12/05/2022)   PRAPARE - Administrator, Civil Service (Medical): No    Lack of Transportation (Non-Medical): No  Physical Activity: Not on file  Stress: Not on file  Social Connections: Not on file  Intimate Partner Violence: Not At Risk (12/05/2022)   Humiliation, Afraid, Rape, and Kick questionnaire    Fear of Current or Ex-Partner: No    Emotionally Abused: No    Physically Abused: No    Sexually Abused: No    FAMILY HISTORY: Family History  Problem Relation Age of Onset   Diabetes Mother    Heart failure Mother    Hypertension Father    Diabetes Father    Heart disease Father    Heart attack Father     ALLERGIES:  has No Known Allergies.  MEDICATIONS:  Current Outpatient Medications  Medication Sig Dispense Refill   acyclovir (ZOVIRAX) 400 MG tablet TAKE ONE (1) TABLET BY MOUTH TWO (2) TIMES DAILY 60 tablet 2   amLODipine (NORVASC) 10 MG tablet Take 10 mg by mouth daily.     b complex vitamins capsule Take 1 capsule by mouth daily.     furosemide (LASIX) 20 MG tablet Take 20 mg by mouth daily as needed for edema.     gemfibrozil (LOPID) 600 MG tablet Take 600 mg by mouth 2 (two) times daily before a meal.      HYDROcodone-acetaminophen (NORCO/VICODIN) 5-325 MG tablet TAKE 1 TABLET BY MOUTH EVERY 6 HOURS AS NEEDED FOR MODERATE PAIN 60 tablet 0   lenalidomide (REVLIMID) 10 MG capsule Take 1 capsule (10 mg total) by mouth daily. Take for 21 days, then none for 7 days. Repeat every 28 days. 21 capsule 0   levothyroxine (SYNTHROID) 50 MCG tablet Take 50 mcg by mouth daily.     LORazepam (ATIVAN) 1 MG tablet Take 0.5 mg by mouth at bedtime as needed for anxiety.     losartan (COZAAR) 50 MG tablet Take 50 mg by mouth daily.     Multiple Vitamin (MULTIVITAMIN) capsule Take 1 capsule by mouth daily.     naproxen sodium (ALEVE) 220 MG tablet Take 440 mg by mouth daily as needed (pain).     nitrofurantoin (MACRODANTIN) 100 MG capsule daily.     pantoprazole (PROTONIX) 20 MG tablet Take 40 mg by mouth daily at 12 noon.     polyethylene glycol powder (GLYCOLAX/MIRALAX) 17 GM/SCOOP powder Take 17 g by mouth at bedtime.     pregabalin (LYRICA) 25 MG capsule Take 1 capsule (25 mg total) by mouth 2 (two) times daily. 60 capsule 3   trimethoprim (TRIMPEX) 100 MG tablet Take 100 mg by mouth daily.     No current facility-administered medications for this visit.    REVIEW OF SYSTEMS:   Constitutional: ( - ) fevers, ( - )  chills , ( - ) night sweats Eyes: ( - ) blurriness of vision, ( - ) double vision, ( - ) watery eyes Ears, nose, mouth, throat, and face: ( - ) mucositis, ( - ) sore throat Respiratory: ( - ) cough, ( - ) dyspnea, ( - ) wheezes Cardiovascular: ( - ) palpitation, ( - ) chest discomfort, ( - ) lower extremity swelling Gastrointestinal:  ( - ) nausea, ( - ) heartburn, ( - ) change in bowel habits Skin: ( - ) abnormal skin rashes Lymphatics: ( - ) new lymphadenopathy, ( - ) easy bruising Neurological: (+ ) numbness, ( +) tingling, ( - )  new weaknesses Behavioral/Psych: ( - ) mood change, ( - ) new changes  All other systems were reviewed with the patient and are negative.  PHYSICAL  EXAMINATION: ECOG PERFORMANCE STATUS: 1 - Symptomatic but completely ambulatory  Vitals:   05/08/23 0936  BP: (!) 174/63  Pulse: 64  Resp: 13  Temp: 97.8 F (36.6 C)  SpO2: 100%        Filed Weights   05/08/23 0936  Weight: 112 lb (50.8 kg)       GENERAL: Well-appearing elderly Caucasian female, alert, no distress and comfortable SKIN: skin color, texture, turgor are normal, no rashes or significant lesions EYES: conjunctiva are pink and non-injected, sclera clear LUNGS: clear to auscultation and percussion with normal breathing effort HEART: regular rate & rhythm and no murmurs and no lower extremity edema Musculoskeletal: no cyanosis of digits and no clubbing  PSYCH: alert & oriented x 3, fluent speech NEURO: no focal motor/sensory deficits  LABORATORY DATA:  I have reviewed the data as listed    Latest Ref Rng & Units 05/08/2023    8:40 AM 04/21/2023    9:58 AM 04/10/2023    8:36 AM  CBC  WBC 4.0 - 10.5 K/uL 3.9  5.7  3.3   Hemoglobin 12.0 - 15.0 g/dL 9.9  9.6  16.1   Hematocrit 36.0 - 46.0 % 29.9  28.5  31.2   Platelets 150 - 400 K/uL 291  203  248        Latest Ref Rng & Units 05/08/2023    8:40 AM 04/21/2023    9:58 AM 04/10/2023    8:36 AM  CMP  Glucose 70 - 99 mg/dL 90  98  82   BUN 8 - 23 mg/dL 16  10  12    Creatinine 0.44 - 1.00 mg/dL 0.96  0.45  4.09   Sodium 135 - 145 mmol/L 134  135  133   Potassium 3.5 - 5.1 mmol/L 3.7  4.2  4.2   Chloride 98 - 111 mmol/L 103  106  104   CO2 22 - 32 mmol/L 22  24  22    Calcium 8.9 - 10.3 mg/dL 9.0  8.8  9.1   Total Protein 6.5 - 8.1 g/dL 7.5  6.7  7.3   Total Bilirubin 0.3 - 1.2 mg/dL 0.4  0.5  0.3   Alkaline Phos 38 - 126 U/L 97  79  103   AST 15 - 41 U/L 20  15  17    ALT 0 - 44 U/L 10  7  7      Lab Results  Component Value Date   MPROTEIN 0.4 (H) 04/10/2023   MPROTEIN 0.6 (H) 03/12/2023   MPROTEIN 0.4 (H) 02/13/2023   Lab Results  Component Value Date   KPAFRELGTCHN 6.2 04/10/2023   KPAFRELGTCHN  7.0 03/12/2023   KPAFRELGTCHN 6.5 02/13/2023   LAMBDASER 6.0 04/10/2023   LAMBDASER 5.6 (L) 03/12/2023   LAMBDASER 7.5 02/13/2023   KAPLAMBRATIO 1.03 04/10/2023   KAPLAMBRATIO 1.25 03/12/2023   KAPLAMBRATIO 0.87 02/13/2023   RADIOGRAPHIC STUDIES: No results found.  ASSESSMENT & PLAN [name] Page 89 y.o. female with medical history significant for newly diagnosed IgG lambda multiple myeloma who presents for a follow up visit.  #IgG Lambda Multiple Myeloma -- Diagnosis confirmed by bone marrow biopsy on 09/27/2021 which shows evidence of 20% involvement by plasma cells.  Additionally has anemia, macrocytosis, leukopenia, and concern for lytic lesions on bone imaging --Prognostic panel shows a deletion 17  with a TP53 mutation --Started Cycle 1 Day 1 of Velcade/Dex on 10/18/2021 --Switched to Dara/Dex on 03/14/2022 due to neuropathy secondary to Velcade.  Plan: --Due for Cycle 15 Day 1 of Dara/Rev/Dex today  --Labs from 04/10/2023 showed M protein 0.4, Kappa 6.2, Lambda 6.0, K/L ratio 1.03 --Labs today show white blood cell count 3.9, hemoglobin 9.9, MCV 91.2, and platelets of 291, Creatinine and LFTs normal.  --RTC in 2 weeks with labs, follow up visit and Dara/Rev/Dex treatments.   # H/O Confusion --decreased dexamethasone to 20 mg on infusion days.  --Symptoms resolved.   #Leukopenia/Macrocytic anemia/Vitamin B12 deficiency: --Secondary to chemotherapy and multiple myeloma --Received vitamin B12 injection 1000 mcg weekly x 4 doses. Continue q 4 weeks.  -- continue p.o. supplementation.  #Right rib pain, improved: --CT chest from 10/25/2021 that didn't show any new or progressive disease.   #Abdominal cramping--resolved: --Symptoms occur after velcade injection.  --Has Bentyl as needed  #Neuropathy: --Grade 2 involving finger tips and feet --Secondary to velcade  --Currently on Lyrica 25 mg PO BID.   #Supportive Care -- chemotherapy education complete -- port placement not  required.  -- zofran 8mg  q8H PRN and compazine 10mg  PO q6H for nausea -- acyclovir 400mg  PO BID for VCZ prophylaxis. Patient was not taking medication as prescribed.  -- Await dental clearance to start Zometa  Orders Placed This Encounter  Procedures   CMP (Cancer Center only)    Standing Status:   Future    Standing Expiration Date:   06/04/2024   Multiple Myeloma Panel (SPEP&IFE w/QIG)    Standing Status:   Future    Standing Expiration Date:   06/04/2024   Lactate dehydrogenase (LDH)    Standing Status:   Future    Standing Expiration Date:   06/04/2024   Kappa/lambda light chains    Standing Status:   Future    Standing Expiration Date:   06/04/2024   CBC with Differential (Cancer Center Only)    Standing Status:   Future    Standing Expiration Date:   06/04/2024   CMP (Cancer Center only)    Standing Status:   Future    Standing Expiration Date:   07/02/2024   Multiple Myeloma Panel (SPEP&IFE w/QIG)    Standing Status:   Future    Standing Expiration Date:   07/02/2024   Lactate dehydrogenase (LDH)    Standing Status:   Future    Standing Expiration Date:   07/02/2024   Kappa/lambda light chains    Standing Status:   Future    Standing Expiration Date:   07/02/2024   CBC with Differential (Cancer Center Only)    Standing Status:   Future    Standing Expiration Date:   07/02/2024   CMP (Cancer Center only)    Standing Status:   Future    Standing Expiration Date:   07/30/2024   Multiple Myeloma Panel (SPEP&IFE w/QIG)    Standing Status:   Future    Standing Expiration Date:   07/30/2024   Lactate dehydrogenase (LDH)    Standing Status:   Future    Standing Expiration Date:   07/30/2024   Kappa/lambda light chains    Standing Status:   Future    Standing Expiration Date:   07/30/2024   CBC with Differential (Cancer Center Only)    Standing Status:   Future    Standing Expiration Date:   07/30/2024   CMP (Cancer Center only)    Standing Status:   Future  Standing Expiration Date:   08/27/2024   Multiple Myeloma Panel (SPEP&IFE w/QIG)    Standing Status:   Future    Standing Expiration Date:   08/27/2024   Lactate dehydrogenase (LDH)    Standing Status:   Future    Standing Expiration Date:   08/27/2024   Kappa/lambda light chains    Standing Status:   Future    Standing Expiration Date:   08/27/2024   CBC with Differential (Cancer Center Only)    Standing Status:   Future    Standing Expiration Date:   08/27/2024   All questions were answered. The patient knows to call the clinic with any problems, questions or concerns.  I have spent a total of 30 minutes minutes of face-to-face and non-face-to-face time, preparing to see the patient, performing a medically appropriate examination, counseling and educating the patient, ordering medications, documenting clinical information in the electronic health record,  and care coordination.   Ulysees Barns, MD Department of Hematology/Oncology [facility] Phone: [phone] Pager: [phone] Email: [email or web address]  05/08/2023 2:54 PM

## 2023-05-11 LAB — KAPPA/LAMBDA LIGHT CHAINS
Kappa free light chain: 8.3 mg/L (ref 3.3–19.4)
Kappa, lambda light chain ratio: 1 (ref 0.26–1.65)
Lambda free light chains: 8.3 mg/L (ref 5.7–26.3)

## 2023-05-12 LAB — MULTIPLE MYELOMA PANEL, SERUM
Albumin SerPl Elph-Mcnc: 3.7 g/dL (ref 2.9–4.4)
Albumin/Glob SerPl: 1.2 (ref 0.7–1.7)
Alpha 1: 0.3 g/dL (ref 0.0–0.4)
Alpha2 Glob SerPl Elph-Mcnc: 0.9 g/dL (ref 0.4–1.0)
B-Globulin SerPl Elph-Mcnc: 1.1 g/dL (ref 0.7–1.3)
Gamma Glob SerPl Elph-Mcnc: 1 g/dL (ref 0.4–1.8)
Globulin, Total: 3.3 g/dL (ref 2.2–3.9)
IgA: 24 mg/dL — ABNORMAL LOW (ref 64–422)
IgG (Immunoglobin G), Serum: 919 mg/dL (ref 586–1602)
IgM (Immunoglobulin M), Srm: 25 mg/dL — ABNORMAL LOW (ref 26–217)
M Protein SerPl Elph-Mcnc: 0.5 g/dL — ABNORMAL HIGH
Total Protein ELP: 7 g/dL (ref 6.0–8.5)

## 2023-05-13 ENCOUNTER — Other Ambulatory Visit: Payer: Self-pay

## 2023-05-18 ENCOUNTER — Other Ambulatory Visit: Payer: Self-pay | Admitting: Hematology and Oncology

## 2023-05-18 DIAGNOSIS — C9000 Multiple myeloma not having achieved remission: Secondary | ICD-10-CM

## 2023-05-19 ENCOUNTER — Encounter: Payer: Self-pay | Admitting: Hematology and Oncology

## 2023-05-24 ENCOUNTER — Other Ambulatory Visit: Payer: Self-pay | Admitting: Hematology and Oncology

## 2023-05-24 DIAGNOSIS — C9000 Multiple myeloma not having achieved remission: Secondary | ICD-10-CM

## 2023-05-25 ENCOUNTER — Emergency Department (HOSPITAL_BASED_OUTPATIENT_CLINIC_OR_DEPARTMENT_OTHER)
Admission: EM | Admit: 2023-05-25 | Discharge: 2023-05-25 | Disposition: A | Discharge status: Home / Self Care | Payer: Medicare PPO | Attending: Emergency Medicine | Admitting: Emergency Medicine

## 2023-05-25 ENCOUNTER — Other Ambulatory Visit: Payer: Self-pay

## 2023-05-25 ENCOUNTER — Encounter: Payer: Self-pay | Admitting: Hematology and Oncology

## 2023-05-25 ENCOUNTER — Encounter (HOSPITAL_BASED_OUTPATIENT_CLINIC_OR_DEPARTMENT_OTHER): Payer: Self-pay | Admitting: Emergency Medicine

## 2023-05-25 ENCOUNTER — Emergency Department (HOSPITAL_BASED_OUTPATIENT_CLINIC_OR_DEPARTMENT_OTHER): Payer: Medicare PPO

## 2023-05-25 DIAGNOSIS — S0003XA Contusion of scalp, initial encounter: Secondary | ICD-10-CM | POA: Diagnosis not present

## 2023-05-25 DIAGNOSIS — W19XXXA Unspecified fall, initial encounter: Secondary | ICD-10-CM | POA: Insufficient documentation

## 2023-05-25 DIAGNOSIS — R001 Bradycardia, unspecified: Secondary | ICD-10-CM | POA: Diagnosis not present

## 2023-05-25 DIAGNOSIS — E871 Hypo-osmolality and hyponatremia: Secondary | ICD-10-CM | POA: Diagnosis not present

## 2023-05-25 DIAGNOSIS — S0990XA Unspecified injury of head, initial encounter: Secondary | ICD-10-CM | POA: Diagnosis not present

## 2023-05-25 DIAGNOSIS — S0083XA Contusion of other part of head, initial encounter: Secondary | ICD-10-CM

## 2023-05-25 DIAGNOSIS — C9000 Multiple myeloma not having achieved remission: Secondary | ICD-10-CM

## 2023-05-25 DIAGNOSIS — S199XXA Unspecified injury of neck, initial encounter: Secondary | ICD-10-CM | POA: Diagnosis not present

## 2023-05-25 DIAGNOSIS — D61818 Other pancytopenia: Secondary | ICD-10-CM | POA: Diagnosis not present

## 2023-05-25 LAB — CBC WITH DIFFERENTIAL/PLATELET
Abs Immature Granulocytes: 0.01 10*3/uL (ref 0.00–0.07)
Basophils Absolute: 0.1 10*3/uL (ref 0.0–0.1)
Basophils Relative: 2 %
Eosinophils Absolute: 0.2 10*3/uL (ref 0.0–0.5)
Eosinophils Relative: 9 %
HCT: 23.3 % — ABNORMAL LOW (ref 36.0–46.0)
Hemoglobin: 8.2 g/dL — ABNORMAL LOW (ref 12.0–15.0)
Immature Granulocytes: 0 %
Lymphocytes Relative: 20 %
Lymphs Abs: 0.6 10*3/uL — ABNORMAL LOW (ref 0.7–4.0)
MCH: 31.2 pg (ref 26.0–34.0)
MCHC: 35.2 g/dL (ref 30.0–36.0)
MCV: 88.6 fL (ref 80.0–100.0)
Monocytes Absolute: 0.5 10*3/uL (ref 0.1–1.0)
Monocytes Relative: 19 %
Neutro Abs: 1.4 10*3/uL — ABNORMAL LOW (ref 1.7–7.7)
Neutrophils Relative %: 50 %
Platelets: 120 10*3/uL — ABNORMAL LOW (ref 150–400)
RBC: 2.63 MIL/uL — ABNORMAL LOW (ref 3.87–5.11)
RDW: 15.1 % (ref 11.5–15.5)
WBC: 2.8 10*3/uL — ABNORMAL LOW (ref 4.0–10.5)
nRBC: 0 % (ref 0.0–0.2)

## 2023-05-25 LAB — URINALYSIS, ROUTINE W REFLEX MICROSCOPIC
Bilirubin Urine: NEGATIVE
Glucose, UA: NEGATIVE mg/dL
Hgb urine dipstick: NEGATIVE
Ketones, ur: NEGATIVE mg/dL
Leukocytes,Ua: NEGATIVE
Nitrite: NEGATIVE
Protein, ur: NEGATIVE mg/dL
Specific Gravity, Urine: 1.01 (ref 1.005–1.030)
pH: 6 (ref 5.0–8.0)

## 2023-05-25 LAB — COMPREHENSIVE METABOLIC PANEL
ALT: 10 U/L (ref 0–44)
AST: 16 U/L (ref 15–41)
Albumin: 3.6 g/dL (ref 3.5–5.0)
Alkaline Phosphatase: 77 U/L (ref 38–126)
Anion gap: 9 (ref 5–15)
BUN: 10 mg/dL (ref 8–23)
CO2: 23 mmol/L (ref 22–32)
Calcium: 8.3 mg/dL — ABNORMAL LOW (ref 8.9–10.3)
Chloride: 96 mmol/L — ABNORMAL LOW (ref 98–111)
Creatinine, Ser: 0.63 mg/dL (ref 0.44–1.00)
GFR, Estimated: >60 mL/min (ref >60)
Glucose, Bld: 110 mg/dL — ABNORMAL HIGH (ref 70–99)
Potassium: 3.7 mmol/L (ref 3.5–5.1)
Sodium: 128 mmol/L — ABNORMAL LOW (ref 135–145)
Total Bilirubin: 0.4 mg/dL (ref 0.3–1.2)
Total Protein: 6.2 g/dL — ABNORMAL LOW (ref 6.5–8.1)

## 2023-05-25 LAB — TROPONIN I (HIGH SENSITIVITY): Troponin I (High Sensitivity): 7 ng/L (ref <18)

## 2023-05-25 MED ORDER — LENALIDOMIDE 10 MG PO CAPS: 10.0000 mg | ORAL_CAPSULE | Freq: Every day | ORAL | 0 refills | Status: DC

## 2023-05-25 NOTE — Discharge Instructions (Signed)
Contact a health care provider if: You have headaches that do not go away. You have dizziness that does not go away. You have double vision or vision changes that do not go away. You have difficulty sleeping. You have changes in your mood. You have new symptoms. Get help right away if: You have sudden: Severe headache. Severe vomiting. Unequal pupil size. One is bigger than the other. Vision problems. Confusion or irritability. You have a seizure. Your symptoms get worse. You have clear or bloody fluid coming from your nose or ears. These symptoms may be an emergency. Get help right away. Call 911.

## 2023-05-25 NOTE — ED Notes (Addendum)
Per family pt fell and hit the back of her head on Friday and since she has felt weak , it has gotten worse she is on a chemo for multiple myloma , last tx was a monthago family states that pt has lost a lot of weight and she has chronic UTI her dr put her on  meds for that, has had some  some nausea and dizziness

## 2023-05-25 NOTE — ED Triage Notes (Signed)
Per family pt fell and hit her head on Friday and has a lump over the weekend she has gotten more tired and just weak , states has multiple myloma and is on med for that and they tought that was causing her weakness but now feels like she needs to be seen , pt has had some dizziness

## 2023-05-25 NOTE — ED Provider Notes (Signed)
[name] Page Provider Note   CSN: [id number] Arrival date & time: 05/25/23  4098     History  Chief Complaint  Patient presents with   Weakness   Fall    [name] Page is a 89 y.o. female.female who present to the ED for concerns of dizziness and HA secondary to a fall. She sustained head injury while transferring from one walker to another on Friday. Patient's husband stated that she appears to be more fatigued and lethargic since the fall over the weekend. Patient stated that she has not taken any medication to help alleviate her symptoms. Patient has a hx of multiple myeloma for which she is on medication for. Patient denies being on blood thinners at the moment.  She has associated mild nausea, moderate headache but denies ataxia or room spinning dizziness and has no other focal neurologic deficits of complaint.   Weakness Fall       Home Medications Prior to Admission medications   Medication Sig Start Date End Date Taking? Authorizing Provider  acyclovir (ZOVIRAX) 400 MG tablet TAKE ONE (1) TABLET BY MOUTH TWO (2) TIMES DAILY 04/20/23   Jaci Standard, MD  amLODipine (NORVASC) 10 MG tablet Take 10 mg by mouth daily.    [provider]  b complex vitamins capsule Take 1 capsule by mouth daily.    [provider]  furosemide (LASIX) 20 MG tablet Take 20 mg by mouth daily as needed for edema.    [provider]  gemfibrozil (LOPID) 600 MG tablet Take 600 mg by mouth 2 (two) times daily before a meal.    [provider]  HYDROcodone-acetaminophen (NORCO/VICODIN) 5-325 MG tablet TAKE 1 TABLET BY MOUTH EVERY 6 HOURS AS NEEDED FOR MODERATE PAIN 05/19/23   Jaci Standard, MD  lenalidomide (REVLIMID) 10 MG capsule Take 1 capsule (10 mg total) by mouth daily. Take for 21 days, then none for 7 days. Repeat every 28 days. 04/28/23   Jaci Standard, MD  levothyroxine (SYNTHROID) 50 MCG tablet Take 50  mcg by mouth daily. 12/22/22   [provider]  LORazepam (ATIVAN) 1 MG tablet Take 0.5 mg by mouth at bedtime as needed for anxiety.    [provider]  losartan (COZAAR) 50 MG tablet Take 50 mg by mouth daily.    [provider]  Multiple Vitamin (MULTIVITAMIN) capsule Take 1 capsule by mouth daily.    [provider]  naproxen sodium (ALEVE) 220 MG tablet Take 440 mg by mouth daily as needed (pain).    [provider]  nitrofurantoin (MACRODANTIN) 100 MG capsule daily.    [provider]  pantoprazole (PROTONIX) 20 MG tablet Take 40 mg by mouth daily at 12 noon.    [provider]  polyethylene glycol powder (GLYCOLAX/MIRALAX) 17 GM/SCOOP powder Take 17 g by mouth at bedtime. 07/26/19   [provider]  pregabalin (LYRICA) 25 MG capsule Take 1 capsule (25 mg total) by mouth 2 (two) times daily. 02/19/23   Briant Cedar, PA-C  trimethoprim (TRIMPEX) 100 MG tablet Take 100 mg by mouth daily. 11/18/22   [provider]      Allergies    Patient has no known allergies.    Review of Systems   Review of Systems  Neurological:  Positive for weakness.    Physical Exam Updated Vital Signs BP (!) 150/49 (BP Location: Right Arm)   Pulse (!) 50  Temp 98 F (36.7 C) (Oral)   Resp (!) 21   Ht 4\' 11"  (1.499 m)   Wt 51.5 kg   SpO2 100%   BMI 22.93 kg/m  Physical Exam Constitutional:      Appearance: She is normal weight.  HENT:     Head: Normocephalic.     Comments: Hematoma left occiput    Nose: Nose normal.     Mouth/Throat:     Mouth: Mucous membranes are moist.  Eyes:     Extraocular Movements: Extraocular movements intact.     Pupils: Pupils are equal, round, and reactive to light.  Cardiovascular:     Rate and Rhythm: Regular rhythm. Bradycardia present.  Pulmonary:     Effort: Pulmonary effort is normal.     Breath sounds: Normal breath sounds.  Abdominal:     General: Abdomen is flat. Bowel  sounds are normal. There is no distension.  Musculoskeletal:        General: No swelling or tenderness. Normal range of motion.     Cervical back: Neck supple.  Skin:    General: Skin is warm and dry.     Capillary Refill: Capillary refill takes less than 2 seconds.  Neurological:     General: No focal deficit present.     Mental Status: She is alert.     ED Results / Procedures / Treatments   Labs (all labs ordered are listed, but only abnormal results are displayed) Labs Reviewed  CBC WITH DIFFERENTIAL/PLATELET  COMPREHENSIVE METABOLIC PANEL  URINALYSIS, ROUTINE W REFLEX MICROSCOPIC  TROPONIN I (HIGH SENSITIVITY)    EKG EKG Interpretation Date/Time:  Monday May 25 2023 10:07:02 EDT Ventricular Rate:  51 PR Interval:  184 QRS Duration:  158 QT Interval:  502 QTC Calculation: 463 R Axis:   86  Text Interpretation: Sinus rhythm Right bundle branch block Confirmed by Virgina Norfolk [phone]) on 05/25/2023 10:17:36 AM  Radiology No results found.  Procedures Procedures    Medications Ordered in ED Medications - No data to display  ED Course/ Medical Decision Making/ A&P Clinical Course as of 05/25/23 1416  Mon May 25, 2023  1126 EKG 12-Lead [AH]  1127 EKG 12-Lead EKG shows right bundle branch block with sinus bradycardia [AH]  1127 CBC with Differential(!) CBC shows pancytopenia history of low blood counts previously although states appear to be slightly lower [AH]  1127 Troponin I (High Sensitivity) Panel within normal limits [AH]  1127 Urinalysis, Routine w reflex microscopic -Urine, Clean Catch Urine without evidence of infection [AH]  1203 Sodium(!): 128 [AH]    Clinical Course User Index [AH] Arthor Captain, PA-C                                 Medical Decision Making Amount and/or Complexity of Data Reviewed Labs: ordered. Decision-making details documented in ED Course. Radiology: ordered. ECG/medicine tests:  Decision-making details  documented in ED Course.   Patient here for evaluation after fall and head injury.  She has had increased weakness, lethargy, mild nausea, headache.  Differential diagnosis includes subdural hematoma, neck injury, medication reaction, infection, electrolyte abnormality, blood loss.  After review of all data points patient appears to have minor head injury.  I visualized and interpreted CT head and C-spine which showed no acute findings.  EKG unremarkable.  Patient's labs do show pancytopenia as per ED course along with mild hyponatremia.  This does not appear to  be an emergent issue that needs emergent correction however does need close outpatient follow-up.  Patient advised to follow closely with PCP and oncology.  Discussed outpatient follow-up and return precautions.  Appropriate for discharge at this time  y images, and any outside records:1}    Final Clinical Impression(s) / ED Diagnoses Final diagnoses:  None    Rx / DC Orders ED Discharge Orders     None         Arthor Captain, PA-C 05/25/23 1418    Virgina Norfolk, DO 05/25/23 1427

## 2023-05-25 NOTE — ED Notes (Signed)
Fall risk armband Fall risk sign on door Patient is wearing sneakers

## 2023-05-27 ENCOUNTER — Telehealth: Payer: Self-pay | Admitting: *Deleted

## 2023-05-27 NOTE — Telephone Encounter (Signed)
Mr Chaviano states Fraya is have a "difficult time" with the Revlimid. Pt is weak, nauseated, tired and  taking a lot of naps. Pt fell Friday and went to ED for evaluation, CT [facility]. They want to talk with Dr Leonides Schanz about options. Has an appt 10/25 with Dr Leonides Schanz. Pt is now on her week off of Revlimid.  Notified that Dr Leonides Schanz says she can hold medication until they see him or she can take 1 tablet every other day. He will discuss a lower dose at next visit. Husband verbalized understanding.

## 2023-06-01 DIAGNOSIS — S51811A Laceration without foreign body of right forearm, initial encounter: Secondary | ICD-10-CM | POA: Diagnosis not present

## 2023-06-03 DIAGNOSIS — S51811A Laceration without foreign body of right forearm, initial encounter: Secondary | ICD-10-CM | POA: Diagnosis not present

## 2023-06-05 ENCOUNTER — Inpatient Hospital Stay: Payer: Medicare PPO | Attending: Hematology and Oncology | Admitting: Physician Assistant

## 2023-06-05 ENCOUNTER — Other Ambulatory Visit: Payer: Self-pay

## 2023-06-05 ENCOUNTER — Inpatient Hospital Stay: Payer: Medicare PPO | Attending: Hematology and Oncology

## 2023-06-05 DIAGNOSIS — C9000 Multiple myeloma not having achieved remission: Secondary | ICD-10-CM

## 2023-06-05 DIAGNOSIS — I10 Essential (primary) hypertension: Secondary | ICD-10-CM | POA: Insufficient documentation

## 2023-06-05 DIAGNOSIS — G620 Drug-induced polyneuropathy: Secondary | ICD-10-CM | POA: Diagnosis not present

## 2023-06-05 DIAGNOSIS — E538 Deficiency of other specified B group vitamins: Secondary | ICD-10-CM | POA: Diagnosis not present

## 2023-06-05 DIAGNOSIS — Z800 Family history of malignant neoplasm of digestive organs: Secondary | ICD-10-CM | POA: Insufficient documentation

## 2023-06-05 DIAGNOSIS — Z79899 Other long term (current) drug therapy: Secondary | ICD-10-CM | POA: Diagnosis not present

## 2023-06-05 DIAGNOSIS — Z7961 Long term (current) use of immunomodulator: Secondary | ICD-10-CM | POA: Diagnosis not present

## 2023-06-05 DIAGNOSIS — Z86718 Personal history of other venous thrombosis and embolism: Secondary | ICD-10-CM | POA: Diagnosis not present

## 2023-06-05 DIAGNOSIS — Z5112 Encounter for antineoplastic immunotherapy: Secondary | ICD-10-CM | POA: Insufficient documentation

## 2023-06-05 DIAGNOSIS — T451X5D Adverse effect of antineoplastic and immunosuppressive drugs, subsequent encounter: Secondary | ICD-10-CM | POA: Diagnosis not present

## 2023-06-05 DIAGNOSIS — I739 Peripheral vascular disease, unspecified: Secondary | ICD-10-CM | POA: Diagnosis not present

## 2023-06-05 DIAGNOSIS — M8580 Other specified disorders of bone density and structure, unspecified site: Secondary | ICD-10-CM | POA: Insufficient documentation

## 2023-06-05 DIAGNOSIS — D6481 Anemia due to antineoplastic chemotherapy: Secondary | ICD-10-CM | POA: Diagnosis not present

## 2023-06-05 LAB — CMP (CANCER CENTER ONLY)
ALT: 9 U/L (ref 0–44)
AST: 17 U/L (ref 15–41)
Albumin: 4 g/dL (ref 3.5–5.0)
Alkaline Phosphatase: 100 U/L (ref 38–126)
Anion gap: 6 (ref 5–15)
BUN: 9 mg/dL (ref 8–23)
CO2: 24 mmol/L (ref 22–32)
Calcium: 8.7 mg/dL — ABNORMAL LOW (ref 8.9–10.3)
Chloride: 101 mmol/L (ref 98–111)
Creatinine: 0.69 mg/dL (ref 0.44–1.00)
GFR, Estimated: >60 mL/min (ref >60)
Glucose, Bld: 76 mg/dL (ref 70–99)
Potassium: 3.8 mmol/L (ref 3.5–5.1)
Sodium: 131 mmol/L — ABNORMAL LOW (ref 135–145)
Total Bilirubin: 0.4 mg/dL (ref 0.3–1.2)
Total Protein: 6.9 g/dL (ref 6.5–8.1)

## 2023-06-05 LAB — CBC WITH DIFFERENTIAL (CANCER CENTER ONLY)
Abs Immature Granulocytes: 0 10*3/uL (ref 0.00–0.07)
Basophils Absolute: 0.1 10*3/uL (ref 0.0–0.1)
Basophils Relative: 4 %
Eosinophils Absolute: 0.1 10*3/uL (ref 0.0–0.5)
Eosinophils Relative: 6 %
HCT: 25.5 % — ABNORMAL LOW (ref 36.0–46.0)
Hemoglobin: 9.1 g/dL — ABNORMAL LOW (ref 12.0–15.0)
Immature Granulocytes: 0 %
Lymphocytes Relative: 29 %
Lymphs Abs: 0.6 10*3/uL — ABNORMAL LOW (ref 0.7–4.0)
MCH: 31.8 pg (ref 26.0–34.0)
MCHC: 35.7 g/dL (ref 30.0–36.0)
MCV: 89.2 fL (ref 80.0–100.0)
Monocytes Absolute: 0.4 10*3/uL (ref 0.1–1.0)
Monocytes Relative: 21 %
Neutro Abs: 0.9 10*3/uL — ABNORMAL LOW (ref 1.7–7.7)
Neutrophils Relative %: 40 %
Platelet Count: 214 10*3/uL (ref 150–400)
RBC: 2.86 MIL/uL — ABNORMAL LOW (ref 3.87–5.11)
RDW: 15.3 % (ref 11.5–15.5)
WBC Count: 2.1 10*3/uL — ABNORMAL LOW (ref 4.0–10.5)
nRBC: 0 % (ref 0.0–0.2)

## 2023-06-05 LAB — LACTATE DEHYDROGENASE: LDH: 142 U/L (ref 98–192)

## 2023-06-05 MED ORDER — DARATUMUMAB-HYALURONIDASE-FIHJ 1800-30000 MG-UT/15ML ~~LOC~~ SOLN
1800.0000 mg | Freq: Once | SUBCUTANEOUS | Status: AC
  Administered 2023-06-05: 1800 mg via SUBCUTANEOUS
  Filled 2023-06-05: qty 15

## 2023-06-05 MED ORDER — DEXAMETHASONE 4 MG PO TABS
20.0000 mg | ORAL_TABLET | Freq: Once | ORAL | Status: AC
  Administered 2023-06-05: 20 mg via ORAL
  Filled 2023-06-05: qty 5

## 2023-06-05 MED ORDER — CYANOCOBALAMIN 1000 MCG/ML IJ SOLN
1000.0000 ug | Freq: Once | INTRAMUSCULAR | Status: AC
  Administered 2023-06-05: 1000 ug via INTRAMUSCULAR
  Filled 2023-06-05: qty 1

## 2023-06-05 MED ORDER — ACETAMINOPHEN 325 MG PO TABS
650.0000 mg | ORAL_TABLET | Freq: Once | ORAL | Status: AC
  Administered 2023-06-05: 650 mg via ORAL
  Filled 2023-06-05: qty 2

## 2023-06-05 MED ORDER — DIPHENHYDRAMINE HCL 25 MG PO CAPS
50.0000 mg | ORAL_CAPSULE | Freq: Once | ORAL | Status: AC
  Administered 2023-06-05: 50 mg via ORAL
  Filled 2023-06-05: qty 2

## 2023-06-05 NOTE — Progress Notes (Signed)
Per Mardella Layman PA, ok to treat today with ANC 0.9

## 2023-06-05 NOTE — Patient Instructions (Signed)
[facility]  Discharge Instructions: Thank you for choosing [facility] to provide your oncology and hematology care.   If you have a lab appointment with the Cancer Center, please go directly to the Cancer Center and check in at the registration area.   Wear comfortable clothing and clothing appropriate for easy access to any Portacath or PICC line.   We strive to give you quality time with your provider. You may need to reschedule your appointment if you arrive late (15 or more minutes).  Arriving late affects you and other patients whose appointments are after yours.  Also, if you miss three or more appointments without notifying the office, you may be dismissed from the clinic at the provider's discretion.      For prescription refill requests, have your pharmacy contact our office and allow 72 hours for refills to be completed.    Today you received the following chemotherapy and/or immunotherapy agents: Dara Faspro.    To help prevent nausea and vomiting after your treatment, we encourage you to take your nausea medication as directed.  BELOW ARE SYMPTOMS THAT SHOULD BE REPORTED IMMEDIATELY: *FEVER GREATER THAN 100.4 F (38 C) OR HIGHER *CHILLS OR SWEATING *NAUSEA AND VOMITING THAT IS NOT CONTROLLED WITH YOUR NAUSEA MEDICATION *UNUSUAL SHORTNESS OF BREATH *UNUSUAL BRUISING OR BLEEDING *URINARY PROBLEMS (pain or burning when urinating, or frequent urination) *BOWEL PROBLEMS (unusual diarrhea, constipation, pain near the anus) TENDERNESS IN MOUTH AND THROAT WITH OR WITHOUT PRESENCE OF ULCERS (sore throat, sores in mouth, or a toothache) UNUSUAL RASH, SWELLING OR PAIN  UNUSUAL VAGINAL DISCHARGE OR ITCHING   Items with * indicate a potential emergency and should be followed up as soon as possible or go to the Emergency Department if any problems should occur.  Please show the CHEMOTHERAPY ALERT CARD or IMMUNOTHERAPY ALERT CARD at  check-in to the Emergency Department and triage nurse.  Should you have questions after your visit or need to cancel or reschedule your appointment, please contact [facility]  Dept: [phone]  and follow the prompts.  Office hours are 8:00 a.m. to 4:30 p.m. Monday - Friday. Please note that voicemails left after 4:00 p.m. may not be returned until the following business day.  We are closed weekends and major holidays. You have access to a nurse at all times for urgent questions. Please call the main number to the clinic Dept: [phone] and follow the prompts.   For any non-urgent questions, you may also contact your provider using MyChart. We now offer e-Visits for anyone 75 and older to request care online for non-urgent symptoms. For details visit [email or web address].   Also download the MyChart app! Go to the app store, search "MyChart", open the app, select [place], and log in with your MyChart username and password.

## 2023-06-05 NOTE — Progress Notes (Signed)
[facility] Telephone:([phone]) [phone]   [phone]) [phone]  PROGRESS NOTE  Patient Care Team: Geoffry Paradise, MD as PCP - General (Internal Medicine)  Hematological/Oncological History # IgG Lambda Multiple Myeloma 06/04/2021: WBC 5.5, Hgb 11.1, MCV 101.3, Plt 306 07/24/2021: M protein 2.3, IFE shows IgG Lambda monoclonal specificity. Cr 0.9 08/07/2021: Establish care with Dr. Leonides Schanz 09/27/2021: Bmbx showed hypercellular bone marrow with plasma cell neoplasm, increased number of atypical plasma cells averaging 20% of all cells in the aspirate 10/18/2021: Cycle 1 of Velcade/Dexamethasone 11/08/2021: Cycle 2 of Velcade/Dexamethasone 11/29/2021: Cycle 3 of Velcade/Dexamethasone 12/20/2021: Cycle 4 of Velcade/Dexamethasone 01/10/2022: Cycle 5 of Velcade/Dexamethasone 01/31/2022: Cycle 6 of Velcade/Dexamethasone 02/28/2022: Cycle 7 of Velcade/Dexamethasone. Dose reduced velcade due to neuropathy  03/14/2022: Cycle 1 Day 1 of Dara/Dex (velcade d/c due to neuropathy).  04/11/2022: Cycle 2 Day 1 of Dara/Dex 05/09/2022: Cycle 3 Day 1 of Dara/Dex 06/06/2022: Cycle 4 Day 1 of Dara/Dex 07/04/2022: Cycle 5 Day 1 of Dara/Dex 08/01/2022: Cycle 6 Day 1 of Dara/Dex 08/29/2022: Cycle 7 Day 1 of Dara/Dex 09/26/2022: Cycle 8 Day 1 of Dara/Dex 10/24/2022: Cycle 9 Day 1 of Dara/Dex 11/21/2022: Cycle 10 Day 1 of Dara/Dex 01/15/2023: Cycle 11 Day 1 of Dara/Dex 02/13/2023: Cycle 12 Day 1 of Dara/Dex 03/12/2023: Cycle 13 Day 1 of Dara/Dex 04/10/2023: Cycle 14 Day 1 of Dara/Rev/Dex (revlimid started due to rising M Protein 0.6)  05/08/2023:  Cycle 15 Day 1 of Dara/Rev/Dex 06/05/2023: Cycle 16 Day 1 of Dara/Dex (holding Revlimid due to poor tolerance and cytopenias)  Interval History:  [name] Page 89 y.o. female with medical history significant for  IgG lambda multiple myeloma who presents for a follow up visit. The patient's last visit was on 05/08/2023. In the interim, patient went to the ED on 05/25/2023 due  to weakness and fall.   On exam today [name] is accompanied by her husband.  She reports that since starting Revlimid therapy she has felt extremely weak and feels like a "zombie". She needs to rest more frequently and has dizziness. She reports her appetite is fairly stable. She denies nausea, vomiting or bowel habit changes. She denies easy bruising or signs of bleeding. She denies fevers, chills, night sweats, shortness of breath, chest pain or cough.  She has no other complaints. Rest of the 10 point ROS is below.  MEDICAL HISTORY:  Past Medical History:  Diagnosis Date   Alopecia 10/28/2016   Anginal pain (HCC)    Anxiety    Arthritis    Cancer (HCC)    Cervical spondylosis    C3-4 AND C4-5   Chest pain 07/28/2008   H/O, normal stress nuclear EF 78%   Chronic cough 07/12/2014   Collapsed lung 07/2010   being treated at [facility] - in left lung   Complication of anesthesia    Cough 07/30/2010   Qualifier: Diagnosis of  By: Marchelle Gearing MD, Murali     Cystocele 05/23/2015   Cystocele, midline 05/02/2013   DEEP VEIN THROMBOSIS/PHLEBITIS 07/30/2010   Qualifier: History of  By: Yancey Flemings CMA, Jennifer     Dislocation closed, shoulder 02/2013   DVT (deep venous thrombosis) (HCC) 2006   Encounter for therapeutic drug monitoring 10/03/2014   GERD (gastroesophageal reflux disease)    occ   Headache    hx   High cholesterol    History of recurrent UTIs 09/24/2011   HYPERLIPIDEMIA 07/30/2010   Qualifier: Diagnosis of  By: Yancey Flemings CMA, Victorino Dike     Hypertension  HYPERTENSION 07/30/2010   Qualifier: Diagnosis of  By: Yancey Flemings CMA, Jennifer     Hypothyroidism    Nasal itching 01/30/2014   Osteopenia 05/23/2015   Peripheral vascular disease (HCC) 2006   dvt's and 50 yrs ago   Pleuritic pain 12/17/2015   PONV (postoperative nausea and vomiting)    Primary localized osteoarthritis of right hip 08/26/2016   Primary osteoarthritis of right hip 08/26/2016   Pulmonary collapse  07/30/2010   Qualifier: Diagnosis of  By: Marchelle Gearing MD, Murali     PULMONARY NODULE 07/30/2010   Qualifier: Diagnosis of  By: Marchelle Gearing MD, Murali     Rash and nonspecific skin eruption 01/02/2015   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   Sarcoidosis    Sarcoidosis of lung (HCC)    Vaginal atrophy 09/24/2011   Visual field defect nasal step 01/02/2015    SURGICAL HISTORY: Past Surgical History:  Procedure Laterality Date   BACK SURGERY  2022   EYE SURGERY     FOOT SURGERY Left 06/2005   bunion hammer toe   HEMORRHOID SURGERY  12/1972   INTRAOCULAR LENS INSERTION     right eye 10 /16 and left 06/16/2005   JOINT REPLACEMENT     KYPHOPLASTY N/A 06/27/2021   Procedure: LUMBAR 3 AND LUMBAR  5 KYPHOPLASTY;  Surgeon: Estill Bamberg, MD;  Location: [facility] OR;  Service: Orthopedics;  Laterality: N/A;   KYPHOPLASTY N/A 09/04/2021   Procedure: THORACIC SEVEN, THORACIC EIGHT, THORACIC NINE, THORACIC TEN KYPHOPLASTY;  Surgeon: Estill Bamberg, MD;  Location: [facility] OR;  Service: Orthopedics;  Laterality: N/A;   REVERSE SHOULDER ARTHROPLASTY Left 12/05/2022   Procedure: REVERSE SHOULDER ARTHROPLASTY;  Surgeon: Beverely Low, MD;  Location: [facility] ORS;  Service: Orthopedics;  Laterality: Left;  CHOICE WITH INTERSCALENE BLOCK   SKIN BIOPSY  01/2007   basal cell carcinoma removed from right lower leg    TOTAL HIP ARTHROPLASTY  06/2007   left   TOTAL HIP ARTHROPLASTY Right 08/26/2016   Procedure: TOTAL HIP ARTHROPLASTY ANTERIOR APPROACH;  Surgeon: Marcene Corning, MD;  Location: [facility] OR;  Service: Orthopedics;  Laterality: Right;   TOTAL KNEE ARTHROPLASTY Right 10/2007   right    SOCIAL HISTORY: Social History   Socioeconomic History   Marital status: Married    Spouse name: Not on file   Number of children: Not on file   Years of education: Not on file   Highest education level: Not on file  Occupational History   Occupation: retired    Associate Professor: RETIRED  Tobacco Use   Smoking status: Never    Smokeless tobacco: Never  Vaping Use   Vaping status: Never Used  Substance and Sexual Activity   Alcohol use: Not Currently    Comment: BEER/glass of wine A DAY   Drug use: No   Sexual activity: Never  Other Topics Concern   Not on file  Social History Narrative   Not on file   Social Determinants of Health   Financial Resource Strain: Not on file  Food Insecurity: No Food Insecurity (12/05/2022)   Hunger Vital Sign    Worried About Running Out of Food in the Last Year: Never true    Ran Out of Food in the Last Year: Never true  Transportation Needs: No Transportation Needs (12/05/2022)   PRAPARE - Administrator, Civil Service (Medical): No    Lack of Transportation (Non-Medical): No  Physical Activity: Not on file  Stress: Not on file  Social Connections: Not on file  Intimate Partner Violence: Not At Risk (12/05/2022)   Humiliation, Afraid, Rape, and Kick questionnaire    Fear of Current or Ex-Partner: No    Emotionally Abused: No    Physically Abused: No    Sexually Abused: No    FAMILY HISTORY: Family History  Problem Relation Age of Onset   Diabetes Mother    Heart failure Mother    Hypertension Father    Diabetes Father    Heart disease Father    Heart attack Father     ALLERGIES:  has No Known Allergies.  MEDICATIONS:  Current Outpatient Medications  Medication Sig Dispense Refill   acyclovir (ZOVIRAX) 400 MG tablet TAKE ONE (1) TABLET BY MOUTH TWO (2) TIMES DAILY 60 tablet 2   amLODipine (NORVASC) 10 MG tablet Take 10 mg by mouth daily.     b complex vitamins capsule Take 1 capsule by mouth daily.     furosemide (LASIX) 20 MG tablet Take 20 mg by mouth daily as needed for edema.     gemfibrozil (LOPID) 600 MG tablet Take 600 mg by mouth 2 (two) times daily before a meal.     HYDROcodone-acetaminophen (NORCO/VICODIN) 5-325 MG tablet TAKE 1 TABLET BY MOUTH EVERY 6 HOURS AS NEEDED FOR MODERATE PAIN 60 tablet 0   lenalidomide (REVLIMID) 10 MG  capsule Take 1 capsule (10 mg total) by mouth daily. Take for 21 days, then none for 7 days. Repeat every 28 days. Berkley Harvey #[id number]. Date obtained 05/25/2023 21 capsule 0   levothyroxine (SYNTHROID) 50 MCG tablet Take 50 mcg by mouth daily.     LORazepam (ATIVAN) 1 MG tablet Take 0.5 mg by mouth at bedtime as needed for anxiety.     losartan (COZAAR) 50 MG tablet Take 50 mg by mouth daily.     Multiple Vitamin (MULTIVITAMIN) capsule Take 1 capsule by mouth daily.     naproxen sodium (ALEVE) 220 MG tablet Take 440 mg by mouth daily as needed (pain).     nitrofurantoin (MACRODANTIN) 100 MG capsule daily.     pantoprazole (PROTONIX) 20 MG tablet Take 40 mg by mouth daily at 12 noon.     polyethylene glycol powder (GLYCOLAX/MIRALAX) 17 GM/SCOOP powder Take 17 g by mouth at bedtime.     pregabalin (LYRICA) 25 MG capsule Take 1 capsule (25 mg total) by mouth 2 (two) times daily. 60 capsule 3   trimethoprim (TRIMPEX) 100 MG tablet Take 100 mg by mouth daily.     No current facility-administered medications for this visit.    REVIEW OF SYSTEMS:   Constitutional: ( - ) fevers, ( - )  chills , ( - ) night sweats Eyes: ( - ) blurriness of vision, ( - ) double vision, ( - ) watery eyes Ears, nose, mouth, throat, and face: ( - ) mucositis, ( - ) sore throat Respiratory: ( - ) cough, ( - ) dyspnea, ( - ) wheezes Cardiovascular: ( - ) palpitation, ( - ) chest discomfort, ( - ) lower extremity swelling Gastrointestinal:  ( - ) nausea, ( - ) heartburn, ( - ) change in bowel habits Skin: ( - ) abnormal skin rashes Lymphatics: ( - ) new lymphadenopathy, ( - ) easy bruising Neurological: (+ ) numbness, ( +) tingling, ( - ) new weaknesses Behavioral/Psych: ( - ) mood change, ( - ) new changes  All other systems were reviewed with the patient and are negative.  PHYSICAL EXAMINATION: ECOG PERFORMANCE STATUS: 1 - Symptomatic but completely ambulatory  Vitals:   06/05/23 1010  BP: (!) 137/48  Pulse: (!) 59   Resp: 16  Temp: 97.7 F (36.5 C)  SpO2: 100%        Filed Weights   06/05/23 1010  Weight: 109 lb 9.6 oz (49.7 kg)       GENERAL: Well-appearing elderly Caucasian female, alert, no distress and comfortable SKIN: skin color, texture, turgor are normal, no rashes or significant lesions EYES: conjunctiva are pink and non-injected, sclera clear LUNGS: clear to auscultation and percussion with normal breathing effort HEART: regular rate & rhythm and no murmurs and no lower extremity edema Musculoskeletal: no cyanosis of digits and no clubbing  PSYCH: alert & oriented x 3, fluent speech NEURO: no focal motor/sensory deficits  LABORATORY DATA:  I have reviewed the data as listed    Latest Ref Rng & Units 06/05/2023    9:56 AM 05/25/2023   10:38 AM 05/08/2023    8:40 AM  CBC  WBC 4.0 - 10.5 K/uL 2.1  2.8  3.9   Hemoglobin 12.0 - 15.0 g/dL 9.1  8.2  9.9   Hematocrit 36.0 - 46.0 % 25.5  23.3  29.9   Platelets 150 - 400 K/uL 214  120  291        Latest Ref Rng & Units 06/05/2023    9:56 AM 05/25/2023   10:38 AM 05/08/2023    8:40 AM  CMP  Glucose 70 - 99 mg/dL 76  161  90   BUN 8 - 23 mg/dL 9  10  16    Creatinine 0.44 - 1.00 mg/dL 0.96  0.45  4.09   Sodium 135 - 145 mmol/L 131  128  134   Potassium 3.5 - 5.1 mmol/L 3.8  3.7  3.7   Chloride 98 - 111 mmol/L 101  96  103   CO2 22 - 32 mmol/L 24  23  22    Calcium 8.9 - 10.3 mg/dL 8.7  8.3  9.0   Total Protein 6.5 - 8.1 g/dL 6.9  6.2  7.5   Total Bilirubin 0.3 - 1.2 mg/dL 0.4  0.4  0.4   Alkaline Phos 38 - 126 U/L 100  77  97   AST 15 - 41 U/L 17  16  20    ALT 0 - 44 U/L 9  10  10      Lab Results  Component Value Date   MPROTEIN 0.5 (H) 05/08/2023   MPROTEIN 0.4 (H) 04/10/2023   MPROTEIN 0.6 (H) 03/12/2023   Lab Results  Component Value Date   KPAFRELGTCHN 8.3 05/08/2023   KPAFRELGTCHN 6.2 04/10/2023   KPAFRELGTCHN 7.0 03/12/2023   LAMBDASER 8.3 05/08/2023   LAMBDASER 6.0 04/10/2023   LAMBDASER 5.6 (L)  03/12/2023   KAPLAMBRATIO 1.00 05/08/2023   KAPLAMBRATIO 1.03 04/10/2023   KAPLAMBRATIO 1.25 03/12/2023   RADIOGRAPHIC STUDIES: CT Head Wo Contrast  Result Date: 05/25/2023 CLINICAL DATA:  Provided history: Polytrauma, blunt. Fall on Friday (with head trauma and "lump"). Dizziness. Weakness. EXAM: CT HEAD WITHOUT CONTRAST CT CERVICAL SPINE WITHOUT CONTRAST TECHNIQUE: Multidetector CT imaging of the head and cervical spine was performed following the standard protocol without intravenous contrast. Multiplanar CT image reconstructions of the cervical spine were also generated. RADIATION DOSE REDUCTION: This exam was performed according to the departmental dose-optimization program which includes automated exposure control, adjustment of the mA and/or kV according to patient size and/or use of iterative reconstruction technique. COMPARISON:  Head CT 05/13/2022. Report from cervical spine MRI 04/26/2007 (images  unavailable). Chest CT 10/25/2021 FINDINGS: CT HEAD FINDINGS Brain: Generalized cerebral atrophy. Small hypodense foci about the bilateral deep gray nuclei again noted, which may reflect prominent perivascular spaces and/or chronic lacunar infarcts. Background patchy and ill-defined hypoattenuation within the cerebral white matter, nonspecific but compatible with moderate chronic small vessel ischemic disease. There is no acute intracranial hemorrhage. No demarcated cortical infarct. No extra-axial fluid collection. No evidence of an intracranial mass. No midline shift. Vascular: No hyperdense vessel.  Atherosclerotic calcifications. Skull: No calvarial fracture. Visible sinuses/orbits: No mass or acute finding within the imaged orbits. Minimal mucosal thickening versus trace fluid level within the right sphenoid sinus. CT CERVICAL SPINE FINDINGS Alignment: 2 mm C2-C3 grade 1 anterolisthesis. Slight C7-T1 grade 1 anterolisthesis. Skull base and vertebrae: The basion-dental and atlanto-dental intervals  are maintained.No evidence of acute fracture to the cervical spine. Facet ankylosis on the right at C2-C3 and on the left at C4-C5. Known chronic T3 superior endplate vertebral compression fracture, unchanged from the prior chest CT of 10/25/2021. Soft tissues and spinal canal: No prevertebral fluid or swelling. No visible canal hematoma. Disc levels: Cervical spondylosis with multilevel disc space narrowing, disc bulges/central disc protrusions, posterior disc osteophyte complexes, endplate spurring, uncovertebral hypertrophy and facet arthrosis. Disc space narrowing is greatest at C3-C4 (advanced), C4-C5 (moderately advanced), C5-C6 (advanced) and C6-C7 (moderately advanced). No appreciable high-grade spinal canal stenosis. Multilevel bony neural foraminal narrowing. Degenerative changes also noted at the C1-C2 articulation. Upper chest: Known 8 mm left upper lobe pulmonary nodule, not appreciably changed in size as compared to the prior chest CT of 10/25/2021 (when accounting for mild motion degradation at this level). IMPRESSION: CT head: 1.  No evidence of an acute intracranial abnormality. 2. Parenchymal atrophy and chronic small ischemic disease, as described. CT cervical spine: 1. No evidence of an acute cervical spine fracture. 2. Mild grade 1 anterolisthesis at C2-C3 and C7-T1. 3. Cervical spondylosis as described. 4. Facet ankylosis on the right at C2-C3 and on the left at C4-C5. 5. Known chronic T3 superior endplate vertebral compression fracture, unchanged from the prior chest CT of 10/25/2021. 6. Known 8 mm left upper lobe pulmonary nodule. Electronically Signed   By: Jackey Loge D.O.   On: 05/25/2023 13:15   CT Cervical Spine Wo Contrast  Result Date: 05/25/2023 CLINICAL DATA:  Provided history: Polytrauma, blunt. Fall on Friday (with head trauma and "lump"). Dizziness. Weakness. EXAM: CT HEAD WITHOUT CONTRAST CT CERVICAL SPINE WITHOUT CONTRAST TECHNIQUE: Multidetector CT imaging of the head and  cervical spine was performed following the standard protocol without intravenous contrast. Multiplanar CT image reconstructions of the cervical spine were also generated. RADIATION DOSE REDUCTION: This exam was performed according to the departmental dose-optimization program which includes automated exposure control, adjustment of the mA and/or kV according to patient size and/or use of iterative reconstruction technique. COMPARISON:  Head CT 05/13/2022. Report from cervical spine MRI 04/26/2007 (images unavailable). Chest CT 10/25/2021 FINDINGS: CT HEAD FINDINGS Brain: Generalized cerebral atrophy. Small hypodense foci about the bilateral deep gray nuclei again noted, which may reflect prominent perivascular spaces and/or chronic lacunar infarcts. Background patchy and ill-defined hypoattenuation within the cerebral white matter, nonspecific but compatible with moderate chronic small vessel ischemic disease. There is no acute intracranial hemorrhage. No demarcated cortical infarct. No extra-axial fluid collection. No evidence of an intracranial mass. No midline shift. Vascular: No hyperdense vessel.  Atherosclerotic calcifications. Skull: No calvarial fracture. Visible sinuses/orbits: No mass or acute finding within the imaged orbits. Minimal mucosal thickening  versus trace fluid level within the right sphenoid sinus. CT CERVICAL SPINE FINDINGS Alignment: 2 mm C2-C3 grade 1 anterolisthesis. Slight C7-T1 grade 1 anterolisthesis. Skull base and vertebrae: The basion-dental and atlanto-dental intervals are maintained.No evidence of acute fracture to the cervical spine. Facet ankylosis on the right at C2-C3 and on the left at C4-C5. Known chronic T3 superior endplate vertebral compression fracture, unchanged from the prior chest CT of 10/25/2021. Soft tissues and spinal canal: No prevertebral fluid or swelling. No visible canal hematoma. Disc levels: Cervical spondylosis with multilevel disc space narrowing, disc  bulges/central disc protrusions, posterior disc osteophyte complexes, endplate spurring, uncovertebral hypertrophy and facet arthrosis. Disc space narrowing is greatest at C3-C4 (advanced), C4-C5 (moderately advanced), C5-C6 (advanced) and C6-C7 (moderately advanced). No appreciable high-grade spinal canal stenosis. Multilevel bony neural foraminal narrowing. Degenerative changes also noted at the C1-C2 articulation. Upper chest: Known 8 mm left upper lobe pulmonary nodule, not appreciably changed in size as compared to the prior chest CT of 10/25/2021 (when accounting for mild motion degradation at this level). IMPRESSION: CT head: 1.  No evidence of an acute intracranial abnormality. 2. Parenchymal atrophy and chronic small ischemic disease, as described. CT cervical spine: 1. No evidence of an acute cervical spine fracture. 2. Mild grade 1 anterolisthesis at C2-C3 and C7-T1. 3. Cervical spondylosis as described. 4. Facet ankylosis on the right at C2-C3 and on the left at C4-C5. 5. Known chronic T3 superior endplate vertebral compression fracture, unchanged from the prior chest CT of 10/25/2021. 6. Known 8 mm left upper lobe pulmonary nodule. Electronically Signed   By: Jackey Loge D.O.   On: 05/25/2023 13:15    ASSESSMENT & PLAN [name] Page 89 y.o. female with medical history significant for newly diagnosed IgG lambda multiple myeloma who presents for a follow up visit.  #IgG Lambda Multiple Myeloma -- Diagnosis confirmed by bone marrow biopsy on 09/27/2021 which shows evidence of 20% involvement by plasma cells.  Additionally has anemia, macrocytosis, leukopenia, and concern for lytic lesions on bone imaging --Prognostic panel shows a deletion 17 with a TP53 mutation --Started Cycle 1 Day 1 of Velcade/Dex on 10/18/2021 --Switched to Dara/Dex on 03/14/2022 due to neuropathy secondary to Velcade.  Plan: --Due for Cycle 16 Day 1 of Dara/Rev/Dex today  --Labs today show white blood cell count 2.1, ANC  900, hemoglobin 9.1, MCV 89.2, and platelets of 214, Creatinine and LFTs normal.  --Most recent myeloma labs from 05/08/2023 showed M protein 0.5, normal serum free light chains/ratio.  --Proceed with Dara/Dex today.  --Due to poor tolerance and cytopenias, recommend to hold Revlimid for now. Will reassess at next visit if patient will resume Revlimid. If so, we recommend at lower dose at 5 mg PO daily 21 days on, 7 days off.  --Precautions given for neutropenia including monitor for fevers, chills, etc.  --RTC in 4 weeks with labs, follow up visit and Dara/Rev/Dex treatments.   # H/O Confusion --decreased dexamethasone to 20 mg on infusion days.  --Symptoms resolved.   #Leukopenia/Macrocytic anemia/Vitamin B12 deficiency: --Secondary to chemotherapy and multiple myeloma --Received vitamin B12 injection 1000 mcg weekly x 4 doses. Continue q 4 weeks.  -- continue p.o. supplementation.  #Right rib pain, improved: --CT chest from 10/25/2021 that didn't show any new or progressive disease.   #Abdominal cramping--resolved: --Symptoms occur after velcade injection.  --Has Bentyl as needed  #Neuropathy: --Grade 2 involving finger tips and feet --Secondary to velcade  --Currently on Lyrica 25 mg PO BID.   #  Supportive Care -- chemotherapy education complete -- port placement not required.  -- zofran 8mg  q8H PRN and compazine 10mg  PO q6H for nausea -- acyclovir 400mg  PO BID for VCZ prophylaxis. Patient was not taking medication as prescribed.  -- Await dental clearance to start Zometa  No orders of the defined types were placed in this encounter.  All questions were answered. The patient knows to call the clinic with any problems, questions or concerns.  I have spent a total of 30 minutes minutes of face-to-face and non-face-to-face time, preparing to see the patient, performing a medically appropriate examination, counseling and educating the patient, ordering medications, documenting  clinical information in the electronic health record,  and care coordination.   Georga Kaufmann PA-C Dept of Hematology and Oncology [facility] at [facility] Phone: [phone]   06/05/2023 1:39 PM

## 2023-06-08 LAB — KAPPA/LAMBDA LIGHT CHAINS
Kappa free light chain: 7.4 mg/L (ref 3.3–19.4)
Kappa, lambda light chain ratio: 0.96 (ref 0.26–1.65)
Lambda free light chains: 7.7 mg/L (ref 5.7–26.3)

## 2023-06-09 DIAGNOSIS — S51811A Laceration without foreign body of right forearm, initial encounter: Secondary | ICD-10-CM | POA: Diagnosis not present

## 2023-06-10 LAB — MULTIPLE MYELOMA PANEL, SERUM
Albumin SerPl Elph-Mcnc: 3.5 g/dL (ref 2.9–4.4)
Albumin/Glob SerPl: 1.3 (ref 0.7–1.7)
Alpha 1: 0.3 g/dL (ref 0.0–0.4)
Alpha2 Glob SerPl Elph-Mcnc: 0.8 g/dL (ref 0.4–1.0)
B-Globulin SerPl Elph-Mcnc: 1 g/dL (ref 0.7–1.3)
Gamma Glob SerPl Elph-Mcnc: 0.7 g/dL (ref 0.4–1.8)
Globulin, Total: 2.8 g/dL (ref 2.2–3.9)
IgA: 19 mg/dL — ABNORMAL LOW (ref 64–422)
IgG (Immunoglobin G), Serum: 818 mg/dL (ref 586–1602)
IgM (Immunoglobulin M), Srm: 21 mg/dL — ABNORMAL LOW (ref 26–217)
M Protein SerPl Elph-Mcnc: 0.4 g/dL — ABNORMAL HIGH
Total Protein ELP: 6.3 g/dL (ref 6.0–8.5)

## 2023-06-15 ENCOUNTER — Other Ambulatory Visit: Payer: Self-pay | Admitting: Physician Assistant

## 2023-06-16 ENCOUNTER — Other Ambulatory Visit: Payer: Self-pay | Admitting: Hematology and Oncology

## 2023-06-16 DIAGNOSIS — C9000 Multiple myeloma not having achieved remission: Secondary | ICD-10-CM

## 2023-06-17 ENCOUNTER — Other Ambulatory Visit: Payer: Self-pay | Admitting: *Deleted

## 2023-06-17 DIAGNOSIS — C9000 Multiple myeloma not having achieved remission: Secondary | ICD-10-CM

## 2023-06-17 MED ORDER — LENALIDOMIDE 10 MG PO CAPS: 10.0000 mg | ORAL_CAPSULE | Freq: Every day | ORAL | 0 refills | Status: DC

## 2023-06-22 DIAGNOSIS — Z13820 Encounter for screening for osteoporosis: Secondary | ICD-10-CM | POA: Diagnosis not present

## 2023-06-22 DIAGNOSIS — E785 Hyperlipidemia, unspecified: Secondary | ICD-10-CM | POA: Diagnosis not present

## 2023-06-22 DIAGNOSIS — I10 Essential (primary) hypertension: Secondary | ICD-10-CM | POA: Diagnosis not present

## 2023-06-22 DIAGNOSIS — Z79899 Other long term (current) drug therapy: Secondary | ICD-10-CM | POA: Diagnosis not present

## 2023-06-22 DIAGNOSIS — M1990 Unspecified osteoarthritis, unspecified site: Secondary | ICD-10-CM | POA: Diagnosis not present

## 2023-06-22 DIAGNOSIS — E039 Hypothyroidism, unspecified: Secondary | ICD-10-CM | POA: Diagnosis not present

## 2023-06-23 ENCOUNTER — Telehealth: Payer: Self-pay | Admitting: *Deleted

## 2023-06-23 NOTE — Telephone Encounter (Signed)
Contacted by [facility] - [phone] to confirm if patient is still taking med.  They received an rx for lenalidomide on 06/17/23 for patient and contacted her to schedule delivery.  Ms. Mcglathery told them the doctor had told her to hold this med at her last visit until seen again. Next appt 07/02/23.  Confirmed with Dr Leonides Schanz that patient should continue to hold lenalidomide until next appt.  Contacted Ms. Mcclurg to confirm she is not currently taking lenalidomide. She states she is not taking at this time and has an unopened bottle at home. She states she feels better not taking it and wants to discuss with Dr. Leonides Schanz at next appt.   Contacted [facility], spoke with Tess to inform that patient is holding med at this time. Tess stated RX [facility] received dated 06/17/23 has [employer] auth# effective until 07/17/23, so they can fill and send until then. Tess stated [facility] will check back with office regarding status of medication after 07/02/23.

## 2023-06-25 ENCOUNTER — Other Ambulatory Visit: Payer: Self-pay | Admitting: Hematology and Oncology

## 2023-06-25 DIAGNOSIS — C9000 Multiple myeloma not having achieved remission: Secondary | ICD-10-CM

## 2023-06-26 ENCOUNTER — Encounter: Payer: Self-pay | Admitting: Hematology and Oncology

## 2023-06-29 DIAGNOSIS — Z0000 Encounter for general adult medical examination without abnormal findings: Secondary | ICD-10-CM | POA: Diagnosis not present

## 2023-06-29 DIAGNOSIS — I10 Essential (primary) hypertension: Secondary | ICD-10-CM | POA: Diagnosis not present

## 2023-06-29 DIAGNOSIS — E785 Hyperlipidemia, unspecified: Secondary | ICD-10-CM | POA: Diagnosis not present

## 2023-06-29 DIAGNOSIS — K219 Gastro-esophageal reflux disease without esophagitis: Secondary | ICD-10-CM | POA: Diagnosis not present

## 2023-06-29 DIAGNOSIS — Z1331 Encounter for screening for depression: Secondary | ICD-10-CM | POA: Diagnosis not present

## 2023-06-29 DIAGNOSIS — M1990 Unspecified osteoarthritis, unspecified site: Secondary | ICD-10-CM | POA: Diagnosis not present

## 2023-06-29 DIAGNOSIS — Z1339 Encounter for screening examination for other mental health and behavioral disorders: Secondary | ICD-10-CM | POA: Diagnosis not present

## 2023-06-29 DIAGNOSIS — C9000 Multiple myeloma not having achieved remission: Secondary | ICD-10-CM | POA: Diagnosis not present

## 2023-06-29 DIAGNOSIS — R82998 Other abnormal findings in urine: Secondary | ICD-10-CM | POA: Diagnosis not present

## 2023-06-29 DIAGNOSIS — D860 Sarcoidosis of lung: Secondary | ICD-10-CM | POA: Diagnosis not present

## 2023-06-29 DIAGNOSIS — D692 Other nonthrombocytopenic purpura: Secondary | ICD-10-CM | POA: Diagnosis not present

## 2023-06-29 DIAGNOSIS — I700 Atherosclerosis of aorta: Secondary | ICD-10-CM | POA: Diagnosis not present

## 2023-07-02 ENCOUNTER — Other Ambulatory Visit: Payer: Self-pay | Admitting: Hematology and Oncology

## 2023-07-02 ENCOUNTER — Inpatient Hospital Stay: Payer: Medicare PPO

## 2023-07-02 ENCOUNTER — Inpatient Hospital Stay: Payer: Medicare PPO | Attending: Hematology and Oncology

## 2023-07-02 ENCOUNTER — Inpatient Hospital Stay: Payer: Medicare PPO | Admitting: Hematology and Oncology

## 2023-07-02 VITALS — BP 169/59

## 2023-07-02 VITALS — BP 161/65 | HR 62 | Temp 97.9°F | Resp 13 | Wt 107.4 lb

## 2023-07-02 DIAGNOSIS — Z79899 Other long term (current) drug therapy: Secondary | ICD-10-CM | POA: Insufficient documentation

## 2023-07-02 DIAGNOSIS — Z5112 Encounter for antineoplastic immunotherapy: Secondary | ICD-10-CM | POA: Diagnosis not present

## 2023-07-02 DIAGNOSIS — Z86718 Personal history of other venous thrombosis and embolism: Secondary | ICD-10-CM | POA: Insufficient documentation

## 2023-07-02 DIAGNOSIS — I10 Essential (primary) hypertension: Secondary | ICD-10-CM | POA: Diagnosis not present

## 2023-07-02 DIAGNOSIS — Z800 Family history of malignant neoplasm of digestive organs: Secondary | ICD-10-CM | POA: Diagnosis not present

## 2023-07-02 DIAGNOSIS — C9000 Multiple myeloma not having achieved remission: Secondary | ICD-10-CM | POA: Insufficient documentation

## 2023-07-02 DIAGNOSIS — I739 Peripheral vascular disease, unspecified: Secondary | ICD-10-CM | POA: Diagnosis not present

## 2023-07-02 DIAGNOSIS — T451X5D Adverse effect of antineoplastic and immunosuppressive drugs, subsequent encounter: Secondary | ICD-10-CM | POA: Insufficient documentation

## 2023-07-02 DIAGNOSIS — M8580 Other specified disorders of bone density and structure, unspecified site: Secondary | ICD-10-CM | POA: Diagnosis not present

## 2023-07-02 DIAGNOSIS — E538 Deficiency of other specified B group vitamins: Secondary | ICD-10-CM | POA: Diagnosis not present

## 2023-07-02 DIAGNOSIS — D6481 Anemia due to antineoplastic chemotherapy: Secondary | ICD-10-CM | POA: Diagnosis not present

## 2023-07-02 DIAGNOSIS — G620 Drug-induced polyneuropathy: Secondary | ICD-10-CM | POA: Diagnosis not present

## 2023-07-02 DIAGNOSIS — Z7961 Long term (current) use of immunomodulator: Secondary | ICD-10-CM | POA: Diagnosis not present

## 2023-07-02 LAB — CMP (CANCER CENTER ONLY)
ALT: 7 U/L (ref 0–44)
AST: 17 U/L (ref 15–41)
Albumin: 4 g/dL (ref 3.5–5.0)
Alkaline Phosphatase: 102 U/L (ref 38–126)
Anion gap: 6 (ref 5–15)
BUN: 14 mg/dL (ref 8–23)
CO2: 24 mmol/L (ref 22–32)
Calcium: 8.8 mg/dL — ABNORMAL LOW (ref 8.9–10.3)
Chloride: 105 mmol/L (ref 98–111)
Creatinine: 0.72 mg/dL (ref 0.44–1.00)
GFR, Estimated: >60 mL/min (ref >60)
Glucose, Bld: 92 mg/dL (ref 70–99)
Potassium: 3.6 mmol/L (ref 3.5–5.1)
Sodium: 135 mmol/L (ref 135–145)
Total Bilirubin: 0.3 mg/dL (ref <1.2)
Total Protein: 6.8 g/dL (ref 6.5–8.1)

## 2023-07-02 LAB — CBC WITH DIFFERENTIAL (CANCER CENTER ONLY)
Abs Immature Granulocytes: 0 10*3/uL (ref 0.00–0.07)
Basophils Absolute: 0 10*3/uL (ref 0.0–0.1)
Basophils Relative: 0 %
Eosinophils Absolute: 0.1 10*3/uL (ref 0.0–0.5)
Eosinophils Relative: 4 %
HCT: 26.4 % — ABNORMAL LOW (ref 36.0–46.0)
Hemoglobin: 8.9 g/dL — ABNORMAL LOW (ref 12.0–15.0)
Immature Granulocytes: 0 %
Lymphocytes Relative: 31 %
Lymphs Abs: 0.8 10*3/uL (ref 0.7–4.0)
MCH: 31.1 pg (ref 26.0–34.0)
MCHC: 33.7 g/dL (ref 30.0–36.0)
MCV: 92.3 fL (ref 80.0–100.0)
Monocytes Absolute: 0.4 10*3/uL (ref 0.1–1.0)
Monocytes Relative: 17 %
Neutro Abs: 1.2 10*3/uL — ABNORMAL LOW (ref 1.7–7.7)
Neutrophils Relative %: 48 %
Platelet Count: 235 10*3/uL (ref 150–400)
RBC: 2.86 MIL/uL — ABNORMAL LOW (ref 3.87–5.11)
RDW: 15.6 % — ABNORMAL HIGH (ref 11.5–15.5)
WBC Count: 2.5 10*3/uL — ABNORMAL LOW (ref 4.0–10.5)
nRBC: 0 % (ref 0.0–0.2)

## 2023-07-02 LAB — LACTATE DEHYDROGENASE: LDH: 142 U/L (ref 98–192)

## 2023-07-02 MED ORDER — DIPHENHYDRAMINE HCL 25 MG PO CAPS
50.0000 mg | ORAL_CAPSULE | Freq: Once | ORAL | Status: AC
  Administered 2023-07-02: 50 mg via ORAL
  Filled 2023-07-02: qty 2

## 2023-07-02 MED ORDER — DEXAMETHASONE 4 MG PO TABS
20.0000 mg | ORAL_TABLET | Freq: Once | ORAL | Status: AC
  Administered 2023-07-02: 20 mg via ORAL
  Filled 2023-07-02: qty 5

## 2023-07-02 MED ORDER — CYANOCOBALAMIN 1000 MCG/ML IJ SOLN
1000.0000 ug | Freq: Once | INTRAMUSCULAR | Status: AC
  Administered 2023-07-02: 1000 ug via INTRAMUSCULAR
  Filled 2023-07-02: qty 1

## 2023-07-02 MED ORDER — DARATUMUMAB-HYALURONIDASE-FIHJ 1800-30000 MG-UT/15ML ~~LOC~~ SOLN
1800.0000 mg | Freq: Once | SUBCUTANEOUS | Status: AC
  Administered 2023-07-02: 1800 mg via SUBCUTANEOUS
  Filled 2023-07-02: qty 15

## 2023-07-02 MED ORDER — ACETAMINOPHEN 325 MG PO TABS
650.0000 mg | ORAL_TABLET | Freq: Once | ORAL | Status: AC
  Administered 2023-07-02: 650 mg via ORAL
  Filled 2023-07-02: qty 2

## 2023-07-02 NOTE — Patient Instructions (Signed)

## 2023-07-02 NOTE — Progress Notes (Signed)
[facility] [phone]   [phone]  PROGRESS NOTE  Patient Care Team: Geoffry Paradise, MD as PCP - General (Internal Medicine)  Hematological/Oncological History # IgG Lambda Multiple Myeloma 06/04/2021: WBC 5.5, Hgb 11.1, MCV 101.3, Plt 306 07/24/2021: M protein 2.3, IFE shows IgG Lambda monoclonal specificity. Cr 0.9 08/07/2021: Establish care with Dr. Leonides Schanz 09/27/2021: Bmbx showed hypercellular bone marrow with plasma cell neoplasm, increased number of atypical plasma cells averaging 20% of all cells in the aspirate 10/18/2021: Cycle 1 of Velcade/Dexamethasone 11/08/2021: Cycle 2 of Velcade/Dexamethasone 11/29/2021: Cycle 3 of Velcade/Dexamethasone 12/20/2021: Cycle 4 of Velcade/Dexamethasone 01/10/2022: Cycle 5 of Velcade/Dexamethasone 01/31/2022: Cycle 6 of Velcade/Dexamethasone 02/28/2022: Cycle 7 of Velcade/Dexamethasone. Dose reduced velcade due to neuropathy  03/14/2022: Cycle 1 Day 1 of Dara/Dex (velcade d/c due to neuropathy).  04/11/2022: Cycle 2 Day 1 of Dara/Dex 05/09/2022: Cycle 3 Day 1 of Dara/Dex 06/06/2022: Cycle 4 Day 1 of Dara/Dex 07/04/2022: Cycle 5 Day 1 of Dara/Dex 08/01/2022: Cycle 6 Day 1 of Dara/Dex 08/29/2022: Cycle 7 Day 1 of Dara/Dex 09/26/2022: Cycle 8 Day 1 of Dara/Dex 10/24/2022: Cycle 9 Day 1 of Dara/Dex 11/21/2022: Cycle 10 Day 1 of Dara/Dex 01/15/2023: Cycle 11 Day 1 of Dara/Dex 02/13/2023: Cycle 12 Day 1 of Dara/Dex 03/12/2023: Cycle 13 Day 1 of Dara/Dex 04/10/2023: Cycle 14 Day 1 of Dara/Rev/Dex (revlimid started due to rising M Protein 0.6)  05/08/2023:  Cycle 15 Day 1 of Dara/Rev/Dex 06/05/2023: Cycle 16 Day 1 of Dara/Dex (holding Revlimid due to poor tolerance and cytopenias) 07/02/2023: Cycle 17 Day 1 of Dara/Dex.  Patient notes she wants to be off Revlimid indefinitely.  Interval History:  [name] Page 89 y.o. female with medical history significant for  IgG lambda multiple myeloma who presents for a follow up visit.  The patient's last visit was on 06/05/2023. In the interim, the patient has stopped Revlimid and felt much better.  On exam today [name] Page reports that she does not want to be on Revlimid ever again.  She reports that she is decided that it is not worth her to continue that medication.  She reports after discontinuing it 1.5 weeks later she "felt like her self".  She reports that now she feels considerably better and her energy and appetite are improved.  She likes feeling like "her old self again".  She notes that otherwise she has had no recent issues such as lightheadedness, dizziness, shortness of breath.  She denies any runny nose, sore throat, or cough.  She received her COVID, flu, and RSV vaccines spaced out each by 1 week.  She notes that she is "not yet swimming laps".  She notes that she looks forward to increasing her physical activity. She denies fevers, chills, night sweats, shortness of breath, chest pain or cough.  She has no other complaints. Rest of the 10 point ROS is below.  MEDICAL HISTORY:  Past Medical History:  Diagnosis Date   Alopecia 10/28/2016   Anginal pain (HCC)    Anxiety    Arthritis    Cancer (HCC)    Cervical spondylosis    C3-4 AND C4-5   Chest pain 07/28/2008   H/O, normal stress nuclear EF 78%   Chronic cough 07/12/2014   Collapsed lung 07/2010   being treated at [facility] - in left lung   Complication of anesthesia    Cough 07/30/2010   Qualifier: Diagnosis of  By: Marchelle Gearing MD, Murali     Cystocele 05/23/2015   Cystocele, midline 05/02/2013  DEEP VEIN THROMBOSIS/PHLEBITIS 07/30/2010   Qualifier: History of  By: Yancey Flemings CMA, Jennifer     Dislocation closed, shoulder 02/2013   DVT (deep venous thrombosis) (HCC) 2006   Encounter for therapeutic drug monitoring 10/03/2014   GERD (gastroesophageal reflux disease)    occ   Headache    hx   High cholesterol    History of recurrent UTIs 09/24/2011   HYPERLIPIDEMIA 07/30/2010   Qualifier: Diagnosis  of  By: Yancey Flemings CMA, Jennifer     Hypertension    HYPERTENSION 07/30/2010   Qualifier: Diagnosis of  By: Yancey Flemings CMA, Jennifer     Hypothyroidism    Nasal itching 01/30/2014   Osteopenia 05/23/2015   Peripheral vascular disease (HCC) 2006   dvt's and 50 yrs ago   Pleuritic pain 12/17/2015   PONV (postoperative nausea and vomiting)    Primary localized osteoarthritis of right hip 08/26/2016   Primary osteoarthritis of right hip 08/26/2016   Pulmonary collapse 07/30/2010   Qualifier: Diagnosis of  By: Marchelle Gearing MD, Murali     PULMONARY NODULE 07/30/2010   Qualifier: Diagnosis of  By: Marchelle Gearing MD, Murali     Rash and nonspecific skin eruption 01/02/2015   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   Sarcoidosis    Sarcoidosis of lung (HCC)    Vaginal atrophy 09/24/2011   Visual field defect nasal step 01/02/2015    SURGICAL HISTORY: Past Surgical History:  Procedure Laterality Date   BACK SURGERY  2022   EYE SURGERY     FOOT SURGERY Left 06/2005   bunion hammer toe   HEMORRHOID SURGERY  12/1972   INTRAOCULAR LENS INSERTION     right eye 10 /16 and left 06/16/2005   JOINT REPLACEMENT     KYPHOPLASTY N/A 06/27/2021   Procedure: LUMBAR 3 AND LUMBAR  5 KYPHOPLASTY;  Surgeon: Estill Bamberg, MD;  Location: [facility] OR;  Service: Orthopedics;  Laterality: N/A;   KYPHOPLASTY N/A 09/04/2021   Procedure: THORACIC SEVEN, THORACIC EIGHT, THORACIC NINE, THORACIC TEN KYPHOPLASTY;  Surgeon: Estill Bamberg, MD;  Location: [facility] OR;  Service: Orthopedics;  Laterality: N/A;   REVERSE SHOULDER ARTHROPLASTY Left 12/05/2022   Procedure: REVERSE SHOULDER ARTHROPLASTY;  Surgeon: Beverely Low, MD;  Location: [facility];  Service: Orthopedics;  Laterality: Left;  CHOICE WITH INTERSCALENE BLOCK   SKIN BIOPSY  01/2007   basal cell carcinoma removed from right lower leg    TOTAL HIP ARTHROPLASTY  06/2007   left   TOTAL HIP ARTHROPLASTY Right 08/26/2016   Procedure: TOTAL HIP ARTHROPLASTY ANTERIOR APPROACH;  Surgeon:  Marcene Corning, MD;  Location: [facility] OR;  Service: Orthopedics;  Laterality: Right;   TOTAL KNEE ARTHROPLASTY Right 10/2007   right    SOCIAL HISTORY: Social History   Socioeconomic History   Marital status: Married    Spouse name: Not on file   Number of children: Not on file   Years of education: Not on file   Highest education level: Not on file  Occupational History   Occupation: retired    Associate Professor: RETIRED  Tobacco Use   Smoking status: Never   Smokeless tobacco: Never  Vaping Use   Vaping status: Never Used  Substance and Sexual Activity   Alcohol use: Not Currently    Comment: BEER/glass of wine A DAY   Drug use: No   Sexual activity: Never  Other Topics Concern   Not on file  Social History Narrative   Not on file   Social Determinants of Corporate investment banker  Strain: Not on file  Food Insecurity: No Food Insecurity (12/05/2022)   Hunger Vital Sign    Worried About Running Out of Food in the Last Year: Never true    Ran Out of Food in the Last Year: Never true  Transportation Needs: No Transportation Needs (12/05/2022)   PRAPARE - Administrator, Civil Service (Medical): No    Lack of Transportation (Non-Medical): No  Physical Activity: Not on file  Stress: Not on file  Social Connections: Not on file  Intimate Partner Violence: Not At Risk (12/05/2022)   Humiliation, Afraid, Rape, and Kick questionnaire    Fear of Current or Ex-Partner: No    Emotionally Abused: No    Physically Abused: No    Sexually Abused: No    FAMILY HISTORY: Family History  Problem Relation Age of Onset   Diabetes Mother    Heart failure Mother    Hypertension Father    Diabetes Father    Heart disease Father    Heart attack Father     ALLERGIES:  has No Known Allergies.  MEDICATIONS:  Current Outpatient Medications  Medication Sig Dispense Refill   acyclovir (ZOVIRAX) 400 MG tablet TAKE ONE (1) TABLET BY MOUTH TWO (2) TIMES DAILY 60 tablet 2    amLODipine (NORVASC) 10 MG tablet Take 10 mg by mouth daily.     b complex vitamins capsule Take 1 capsule by mouth daily.     furosemide (LASIX) 20 MG tablet Take 20 mg by mouth daily as needed for edema.     gemfibrozil (LOPID) 600 MG tablet Take 600 mg by mouth 2 (two) times daily before a meal.     HYDROcodone-acetaminophen (NORCO/VICODIN) 5-325 MG tablet TAKE 1 TABLET BY MOUTH EVERY 6 HOURS AS NEEDED FOR MODERATE PAIN 60 tablet 0   lenalidomide (REVLIMID) 10 MG capsule Take 1 capsule (10 mg total) by mouth daily. Take for 21 days, then none for 7 days.  Auth # [id number] . Date obtained 06/17/2023 21 capsule 0   levothyroxine (SYNTHROID) 50 MCG tablet Take 50 mcg by mouth daily.     LORazepam (ATIVAN) 1 MG tablet Take 0.5 mg by mouth at bedtime as needed for anxiety.     losartan (COZAAR) 50 MG tablet Take 50 mg by mouth daily.     Multiple Vitamin (MULTIVITAMIN) capsule Take 1 capsule by mouth daily.     naproxen sodium (ALEVE) 220 MG tablet Take 440 mg by mouth daily as needed (pain).     nitrofurantoin (MACRODANTIN) 100 MG capsule daily.     pantoprazole (PROTONIX) 20 MG tablet Take 40 mg by mouth daily at 12 noon.     polyethylene glycol powder (GLYCOLAX/MIRALAX) 17 GM/SCOOP powder Take 17 g by mouth at bedtime.     pregabalin (LYRICA) 25 MG capsule TAKE 1 CAPSULE BY MOUTH TWICE DAILY 60 capsule 1   No current facility-administered medications for this visit.    REVIEW OF SYSTEMS:   Constitutional: ( - ) fevers, ( - )  chills , ( - ) night sweats Eyes: ( - ) blurriness of vision, ( - ) double vision, ( - ) watery eyes Ears, nose, mouth, throat, and face: ( - ) mucositis, ( - ) sore throat Respiratory: ( - ) cough, ( - ) dyspnea, ( - ) wheezes Cardiovascular: ( - ) palpitation, ( - ) chest discomfort, ( - ) lower extremity swelling Gastrointestinal:  ( - ) nausea, ( - ) heartburn, ( - )  change in bowel habits Skin: ( - ) abnormal skin rashes Lymphatics: ( - ) new lymphadenopathy, ( -  ) easy bruising Neurological: (+ ) numbness, ( +) tingling, ( - ) new weaknesses Behavioral/Psych: ( - ) mood change, ( - ) new changes  All other systems were reviewed with the patient and are negative.  PHYSICAL EXAMINATION: ECOG PERFORMANCE STATUS: 1 - Symptomatic but completely ambulatory  There were no vitals filed for this visit.       There were no vitals filed for this visit.      GENERAL: Well-appearing elderly Caucasian female, alert, no distress and comfortable SKIN: skin color, texture, turgor are normal, no rashes or significant lesions EYES: conjunctiva are pink and non-injected, sclera clear LUNGS: clear to auscultation and percussion with normal breathing effort HEART: regular rate & rhythm and no murmurs and no lower extremity edema Musculoskeletal: no cyanosis of digits and no clubbing  PSYCH: alert & oriented x 3, fluent speech NEURO: no focal motor/sensory deficits  LABORATORY DATA:  I have reviewed the data as listed    Latest Ref Rng & Units 06/05/2023    9:56 AM 05/25/2023   10:38 AM 05/08/2023    8:40 AM  CBC  WBC 4.0 - 10.5 K/uL 2.1  2.8  3.9   Hemoglobin 12.0 - 15.0 g/dL 9.1  8.2  9.9   Hematocrit 36.0 - 46.0 % 25.5  23.3  29.9   Platelets 150 - 400 K/uL 214  120  291        Latest Ref Rng & Units 06/05/2023    9:56 AM 05/25/2023   10:38 AM 05/08/2023    8:40 AM  CMP  Glucose 70 - 99 mg/dL 76  161  90   BUN 8 - 23 mg/dL 9  10  16    Creatinine 0.44 - 1.00 mg/dL 0.96  0.45  4.09   Sodium 135 - 145 mmol/L 131  128  134   Potassium 3.5 - 5.1 mmol/L 3.8  3.7  3.7   Chloride 98 - 111 mmol/L 101  96  103   CO2 22 - 32 mmol/L 24  23  22    Calcium 8.9 - 10.3 mg/dL 8.7  8.3  9.0   Total Protein 6.5 - 8.1 g/dL 6.9  6.2  7.5   Total Bilirubin 0.3 - 1.2 mg/dL 0.4  0.4  0.4   Alkaline Phos 38 - 126 U/L 100  77  97   AST 15 - 41 U/L 17  16  20    ALT 0 - 44 U/L 9  10  10      Lab Results  Component Value Date   MPROTEIN 0.4 (H) 06/05/2023    MPROTEIN 0.5 (H) 05/08/2023   MPROTEIN 0.4 (H) 04/10/2023   Lab Results  Component Value Date   KPAFRELGTCHN 7.4 06/05/2023   KPAFRELGTCHN 8.3 05/08/2023   KPAFRELGTCHN 6.2 04/10/2023   LAMBDASER 7.7 06/05/2023   LAMBDASER 8.3 05/08/2023   LAMBDASER 6.0 04/10/2023   KAPLAMBRATIO 0.96 06/05/2023   KAPLAMBRATIO 1.00 05/08/2023   KAPLAMBRATIO 1.03 04/10/2023   RADIOGRAPHIC STUDIES: No results found.  ASSESSMENT & PLAN [name] Page 89 y.o. female with medical history significant for newly diagnosed IgG lambda multiple myeloma who presents for a follow up visit.  #IgG Lambda Multiple Myeloma -- Diagnosis confirmed by bone marrow biopsy on 09/27/2021 which shows evidence of 20% involvement by plasma cells.  Additionally has anemia, macrocytosis, leukopenia, and concern for lytic lesions on bone  imaging --Prognostic panel shows a deletion 17 with a TP53 mutation --Started Cycle 1 Day 1 of Velcade/Dex on 10/18/2021 --Switched to Dara/Dex on 03/14/2022 due to neuropathy secondary to Velcade.  Plan: --Due for Cycle 17 Day 1 of Dara/Dex today. OK to proceed.  --Labs today show white blood cell count 2.5, hemoglobin 8.9, MCV 92.3, platelets 235, Creatinine and LFTs normal.  --Most recent myeloma labs from 06/05/2023 showed M protein 0.4, kappa 7.4, lambda 7.7, ratio 0.96 --Due to poor tolerance and cytopenias, patient notes that she would like to discontinue Revlimid and does not wish to restart in the future. --Precautions given for neutropenia including monitor for fevers, chills, etc.  --Discussed her rising M protein on Darzalex therapy.  The patient notes she would like to continue her Darzalex for now and if her numbers were to worsen we could consider altering her therapy.  She voiced understanding of the risks and benefits of this approach. --RTC in 4 weeks with labs, follow up visit and Dara/Dex treatments.   # H/O Confusion --decreased dexamethasone to 20 mg on infusion days.   --Symptoms resolved.   #Leukopenia/Macrocytic anemia/Vitamin B12 deficiency: --Secondary to chemotherapy and multiple myeloma --Received vitamin B12 injection 1000 mcg weekly x 4 doses. Continue q 4 weeks.  -- continue p.o. supplementation.  #Right rib pain, improved: --CT chest from 10/25/2021 that didn't show any new or progressive disease.   #Abdominal cramping--resolved: --Symptoms occur after velcade injection.  --Has Bentyl as needed  #Neuropathy: --Grade 2 involving finger tips and feet --Secondary to velcade  --Currently on Lyrica 25 mg PO BID.   #Supportive Care -- chemotherapy education complete -- port placement not required.  -- zofran 8mg  q8H PRN and compazine 10mg  PO q6H for nausea -- acyclovir 400mg  PO BID for VCZ prophylaxis. Patient was not taking medication as prescribed.  -- Await dental clearance to start Zometa  No orders of the defined types were placed in this encounter.  All questions were answered. The patient knows to call the clinic with any problems, questions or concerns.  I have spent a total of 30 minutes minutes of face-to-face and non-face-to-face time, preparing to see the patient, performing a medically appropriate examination, counseling and educating the patient, ordering medications, documenting clinical information in the electronic health record,  and care coordination.   Ulysees Barns, MD Department of Hematology/Oncology [facility] Phone: [phone] Pager: [phone] Email: [email or web address]  07/02/2023 7:16 AM

## 2023-07-02 NOTE — Progress Notes (Signed)
Per Dr Leonides Schanz, ok to tx with elevated BP.

## 2023-07-03 LAB — KAPPA/LAMBDA LIGHT CHAINS
Kappa free light chain: 5.3 mg/L (ref 3.3–19.4)
Kappa, lambda light chain ratio: 0.91 (ref 0.26–1.65)
Lambda free light chains: 5.8 mg/L (ref 5.7–26.3)

## 2023-07-08 ENCOUNTER — Other Ambulatory Visit: Payer: Self-pay

## 2023-07-08 DIAGNOSIS — Z4789 Encounter for other orthopedic aftercare: Secondary | ICD-10-CM | POA: Diagnosis not present

## 2023-07-12 LAB — MULTIPLE MYELOMA PANEL, SERUM
Albumin SerPl Elph-Mcnc: 3.5 g/dL (ref 2.9–4.4)
Albumin/Glob SerPl: 1.3 (ref 0.7–1.7)
Alpha 1: 0.3 g/dL (ref 0.0–0.4)
Alpha2 Glob SerPl Elph-Mcnc: 0.8 g/dL (ref 0.4–1.0)
B-Globulin SerPl Elph-Mcnc: 1.6 g/dL — ABNORMAL HIGH (ref 0.7–1.3)
Gamma Glob SerPl Elph-Mcnc: 0.3 g/dL — ABNORMAL LOW (ref 0.4–1.8)
Globulin, Total: 2.9 g/dL (ref 2.2–3.9)
IgA: 13 mg/dL — ABNORMAL LOW (ref 64–422)
IgG (Immunoglobin G), Serum: 842 mg/dL (ref 586–1602)
IgM (Immunoglobulin M), Srm: 20 mg/dL — ABNORMAL LOW (ref 26–217)
M Protein SerPl Elph-Mcnc: 0.5 g/dL — ABNORMAL HIGH
Total Protein ELP: 6.4 g/dL (ref 6.0–8.5)

## 2023-07-20 ENCOUNTER — Telehealth: Payer: Self-pay | Admitting: Hematology and Oncology

## 2023-07-21 ENCOUNTER — Other Ambulatory Visit: Payer: Self-pay

## 2023-07-27 ENCOUNTER — Other Ambulatory Visit: Payer: Self-pay | Admitting: Hematology and Oncology

## 2023-07-27 DIAGNOSIS — C9000 Multiple myeloma not having achieved remission: Secondary | ICD-10-CM

## 2023-07-28 ENCOUNTER — Other Ambulatory Visit: Payer: Self-pay

## 2023-07-31 ENCOUNTER — Inpatient Hospital Stay: Payer: Medicare PPO

## 2023-07-31 ENCOUNTER — Emergency Department (HOSPITAL_COMMUNITY)
Admission: EM | Admit: 2023-07-31 | Discharge: 2023-07-31 | Disposition: A | Discharge status: Home / Self Care | Payer: Medicare PPO | Attending: Emergency Medicine | Admitting: Emergency Medicine

## 2023-07-31 ENCOUNTER — Other Ambulatory Visit: Payer: Self-pay

## 2023-07-31 ENCOUNTER — Emergency Department (HOSPITAL_COMMUNITY): Payer: Medicare PPO

## 2023-07-31 ENCOUNTER — Inpatient Hospital Stay: Payer: Medicare PPO | Admitting: Hematology and Oncology

## 2023-07-31 VITALS — BP 109/45 | HR 66 | Temp 98.7°F | Resp 18 | Ht 59.0 in | Wt 102.0 lb

## 2023-07-31 DIAGNOSIS — G629 Polyneuropathy, unspecified: Secondary | ICD-10-CM | POA: Diagnosis not present

## 2023-07-31 DIAGNOSIS — E538 Deficiency of other specified B group vitamins: Secondary | ICD-10-CM | POA: Insufficient documentation

## 2023-07-31 DIAGNOSIS — S32591A Other specified fracture of right pubis, initial encounter for closed fracture: Secondary | ICD-10-CM | POA: Diagnosis not present

## 2023-07-31 DIAGNOSIS — Z79899 Other long term (current) drug therapy: Secondary | ICD-10-CM | POA: Insufficient documentation

## 2023-07-31 DIAGNOSIS — S22010A Wedge compression fracture of first thoracic vertebra, initial encounter for closed fracture: Secondary | ICD-10-CM | POA: Diagnosis not present

## 2023-07-31 DIAGNOSIS — C9000 Multiple myeloma not having achieved remission: Secondary | ICD-10-CM | POA: Insufficient documentation

## 2023-07-31 DIAGNOSIS — Z043 Encounter for examination and observation following other accident: Secondary | ICD-10-CM | POA: Diagnosis not present

## 2023-07-31 DIAGNOSIS — S0990XA Unspecified injury of head, initial encounter: Secondary | ICD-10-CM | POA: Diagnosis not present

## 2023-07-31 DIAGNOSIS — D649 Anemia, unspecified: Secondary | ICD-10-CM

## 2023-07-31 DIAGNOSIS — Z96641 Presence of right artificial hip joint: Secondary | ICD-10-CM | POA: Diagnosis not present

## 2023-07-31 DIAGNOSIS — S79911A Unspecified injury of right hip, initial encounter: Secondary | ICD-10-CM | POA: Diagnosis not present

## 2023-07-31 DIAGNOSIS — W19XXXA Unspecified fall, initial encounter: Secondary | ICD-10-CM | POA: Diagnosis not present

## 2023-07-31 DIAGNOSIS — Z96643 Presence of artificial hip joint, bilateral: Secondary | ICD-10-CM | POA: Diagnosis not present

## 2023-07-31 DIAGNOSIS — M25551 Pain in right hip: Secondary | ICD-10-CM | POA: Diagnosis present

## 2023-07-31 DIAGNOSIS — Z96612 Presence of left artificial shoulder joint: Secondary | ICD-10-CM | POA: Diagnosis not present

## 2023-07-31 DIAGNOSIS — S32511A Fracture of superior rim of right pubis, initial encounter for closed fracture: Secondary | ICD-10-CM | POA: Diagnosis not present

## 2023-07-31 DIAGNOSIS — S199XXA Unspecified injury of neck, initial encounter: Secondary | ICD-10-CM | POA: Diagnosis not present

## 2023-07-31 LAB — CMP (CANCER CENTER ONLY)
ALT: 7 U/L (ref 0–44)
AST: 18 U/L (ref 15–41)
Albumin: 4 g/dL (ref 3.5–5.0)
Alkaline Phosphatase: 88 U/L (ref 38–126)
Anion gap: 7 (ref 5–15)
BUN: 14 mg/dL (ref 8–23)
CO2: 23 mmol/L (ref 22–32)
Calcium: 8.8 mg/dL — ABNORMAL LOW (ref 8.9–10.3)
Chloride: 107 mmol/L (ref 98–111)
Creatinine: 0.72 mg/dL (ref 0.44–1.00)
GFR, Estimated: >60 mL/min (ref >60)
Glucose, Bld: 98 mg/dL (ref 70–99)
Potassium: 3.8 mmol/L (ref 3.5–5.1)
Sodium: 137 mmol/L (ref 135–145)
Total Bilirubin: 0.4 mg/dL (ref <1.2)
Total Protein: 6.5 g/dL (ref 6.5–8.1)

## 2023-07-31 LAB — CBC WITH DIFFERENTIAL (CANCER CENTER ONLY)
Abs Immature Granulocytes: 0.01 10*3/uL (ref 0.00–0.07)
Basophils Absolute: 0 10*3/uL (ref 0.0–0.1)
Basophils Relative: 0 %
Eosinophils Absolute: 0.1 10*3/uL (ref 0.0–0.5)
Eosinophils Relative: 2 %
HCT: 26 % — ABNORMAL LOW (ref 36.0–46.0)
Hemoglobin: 8.5 g/dL — ABNORMAL LOW (ref 12.0–15.0)
Immature Granulocytes: 0 %
Lymphocytes Relative: 14 %
Lymphs Abs: 0.6 10*3/uL — ABNORMAL LOW (ref 0.7–4.0)
MCH: 30.5 pg (ref 26.0–34.0)
MCHC: 32.7 g/dL (ref 30.0–36.0)
MCV: 93.2 fL (ref 80.0–100.0)
Monocytes Absolute: 0.5 10*3/uL (ref 0.1–1.0)
Monocytes Relative: 10 %
Neutro Abs: 3.4 10*3/uL (ref 1.7–7.7)
Neutrophils Relative %: 74 %
Platelet Count: 232 10*3/uL (ref 150–400)
RBC: 2.79 MIL/uL — ABNORMAL LOW (ref 3.87–5.11)
RDW: 15 % (ref 11.5–15.5)
WBC Count: 4.6 10*3/uL (ref 4.0–10.5)
nRBC: 0 % (ref 0.0–0.2)

## 2023-07-31 LAB — LACTATE DEHYDROGENASE: LDH: 161 U/L (ref 98–192)

## 2023-07-31 MED ORDER — ACETAMINOPHEN 325 MG PO TABS
650.0000 mg | ORAL_TABLET | Freq: Once | ORAL | Status: AC
  Administered 2023-07-31: 650 mg via ORAL
  Filled 2023-07-31: qty 2

## 2023-07-31 NOTE — ED Triage Notes (Signed)
Pt brought over from [facility]. Pt had mech fall last night, hit L face on bedside table and then landed on R hip.  Hx double hip replacements, and multiple myeloma.  AOx4

## 2023-07-31 NOTE — Care Management (Signed)
Transition of Care [facility]) - Emergency Department Mini Assessment   Patient Details  Name: [name] Page MRN: [id number] Date of Birth: March 12, 1931  Transition of Care [facility]) CM/SW Contact:    Lavenia Atlas, RN Phone Number: 07/31/2023, 3:25 PM   Clinical Narrative: Patient currently at [facility] ED. Received TOC consult for HHPT. Per chart review patient is from [facility] and had a fall last night. This RNCM spoke with patient and her husband [name] at bedside. This RNCM offered choice for [facility] services however patient prefers to use the PT services at her retirement [name] This RNCM spoke with Huntley Dec at [facility] he reports they will provide services. This RNCM will fax clinicals to Huntley Dec at [facility] [phone]. Patient's husband reports prior to admission patient has the following DME: rollator walker, cane, wheelchair.  Patient's husband will transport patient home.  Notified MD.    ED Mini Assessment: What brought you to the Emergency Department? : fall last night walked without her walker  Barriers to Discharge: Continued Medical Work up  [employer] interventions: coordinated [employer] of departure: Car  Interventions which prevented an admission or readmission: Home Health Consult or Services    Patient Designer, industrial/product     Spoke with: Patient and husband Contact Date: 07/31/23,   Contact time: 1523      Patient states their goals for this hospitalization and ongoing recovery are:: to feel better CMS Medicare.gov Compare Post Acute Care list provided to:: Patient Choice offered to / list presented to : Patient  Admission diagnosis:  Fall Patient Active Problem List   Diagnosis Date Noted   H/O total shoulder replacement, left 12/05/2022   Vitamin B12 deficiency 11/22/2021   Anxiety 11/01/2021   Chronic idiopathic constipation 11/01/2021   DVT (deep venous thrombosis) (HCC) 11/01/2021   Insomnia 11/01/2021   RBBB  11/01/2021   PONV (postoperative nausea and vomiting) 11/01/2021   Pathological fracture of vertebra 11/01/2021   Skin lesion 11/01/2021   Multiple myeloma (HCC) 10/08/2021   Difficulty in walking, not elsewhere classified 06/27/2021   Gastroesophageal reflux disease 06/27/2021   Generalized anxiety disorder 06/27/2021   Hypothyroidism, unspecified 06/27/2021   Long term (current) use of systemic steroids 06/27/2021   Muscle weakness (generalized) 06/27/2021   Other acute postprocedural pain 06/27/2021   Other chronic allergic conjunctivitis 06/27/2021   Personal history of other venous thrombosis and embolism 06/27/2021   Other specified disorders of bone density and structure, unspecified site 06/27/2021   Other muscle spasm 06/27/2021   Tear of skin of right wrist 02/12/2021   Degeneration of lumbar intervertebral disc 01/17/2020   Hardening of the aorta (main artery of the heart) (HCC) 09/22/2019   Vasovagal syncope 09/20/2019   Migraine without aura, not refractory 03/14/2019   Shoulder joint pain 10/15/2017   Jaw pain 06/12/2017   Alopecia 10/28/2016   Primary localized osteoarthritis of right hip 08/26/2016   Primary osteoarthritis of right hip 08/26/2016   Pleuritic pain 12/17/2015   Non-thrombocytopenic purpura (HCC) 09/24/2015   Osteopenia 05/23/2015   Cystocele 05/23/2015   Rash and nonspecific skin eruption 01/02/2015   Visual field defect nasal step 01/02/2015   Encounter for therapeutic drug monitoring 10/03/2014   Chronic cough 07/12/2014   Nasal itching 01/30/2014   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   Sarcoidosis 01/30/2014   Bronchial stenosis, left 07/20/2013   Cystocele, midline 05/02/2013   Chest pain    History of recurrent UTIs 09/24/2011   Vaginal  atrophy 09/24/2011   Orbital disorder 06/05/2011   Orbital lesion 06/05/2011   HYPERLIPIDEMIA 07/30/2010   Essential hypertension 07/30/2010   DEEP VEIN THROMBOSIS/PHLEBITIS 07/30/2010   PULMONARY  COLLAPSE 07/30/2010   PULMONARY NODULE 07/30/2010   COUGH 07/30/2010   Osteoarthritis 07/02/2009   PCP:  Geoffry Paradise, MD Pharmacy:   [facility] [place], [place] - [address] [place] [address] Phone: [phone] Fax: [phone]  [facility] [place] [facility] [address], [address] [place] [address] Phone: [phone] Fax: [phone]  [facility] - [place], [place] - [address] [place] [address] Phone: [phone] Fax: [phone]

## 2023-07-31 NOTE — ED Provider Notes (Signed)
[facility] AT [facility] Provider Note   CSN: [id number] Arrival date & time: 07/31/23  0932     History  Chief Complaint  Patient presents with   Fall   Groin Pain    [name] Page is a 89 y.o. female.  Patient is a 89 year old female with a past medical history of multiple myeloma on chemotherapy, hypertension, hypothyroidism, prior DVT no longer on AC presenting to the emergency department after a fall.  Patient states that she was trying to walk without her walker last night when she lost her balance and tripped over her feet and fell.  She states that she hit the left side of her face on her dresser.  She states that she initially iced her face and felt better when she got up this morning had significant pain in her right hip specially with putting weight on her leg.  She states that she has been able to chew normally and denies any loose teeth.  She states that she was only able to walk a few steps due to the pain.  She denies any lightheadedness or dizziness prior to the fall, denies any loss of consciousness.  She states she has not taken anything for pain yet today.  Of note, she reports that she has had bilateral hip replacements.  The history is provided by the patient and the spouse.  Fall  Groin Pain       Home Medications Prior to Admission medications   Medication Sig Start Date End Date Taking? Authorizing Provider  acyclovir (ZOVIRAX) 400 MG tablet TAKE ONE (1) TABLET BY MOUTH TWO (2) TIMES DAILY 07/27/23   Jaci Standard, MD  amLODipine (NORVASC) 10 MG tablet Take 10 mg by mouth daily.    [provider]  b complex vitamins capsule Take 1 capsule by mouth daily.    [provider]  furosemide (LASIX) 20 MG tablet Take 20 mg by mouth daily as needed for edema.    [provider]  gemfibrozil (LOPID) 600 MG tablet Take 600 mg by mouth 2 (two) times daily before a meal.    [provider]   HYDROcodone-acetaminophen (NORCO/VICODIN) 5-325 MG tablet TAKE 1 TABLET BY MOUTH EVERY 6 HOURS AS NEEDED FOR MODERATE PAIN 07/27/23   Jaci Standard, MD  levothyroxine (SYNTHROID) 50 MCG tablet Take 50 mcg by mouth daily. 12/22/22   [provider]  LORazepam (ATIVAN) 1 MG tablet Take 0.5 mg by mouth at bedtime as needed for anxiety.    [provider]  losartan (COZAAR) 50 MG tablet Take 50 mg by mouth daily.    [provider]  Multiple Vitamin (MULTIVITAMIN) capsule Take 1 capsule by mouth daily.    [provider]  naproxen sodium (ALEVE) 220 MG tablet Take 440 mg by mouth daily as needed (pain).    [provider]  nitrofurantoin (MACRODANTIN) 100 MG capsule daily.    [provider]  pantoprazole (PROTONIX) 20 MG tablet Take 40 mg by mouth daily at 12 noon.    [provider]  pregabalin (LYRICA) 25 MG capsule TAKE 1 CAPSULE BY MOUTH TWICE DAILY 06/15/23   Pollyann Samples, NP      Allergies    Patient has no known allergies.    Review of Systems   Review of Systems  Physical Exam Updated Vital Signs BP (!) 146/65 (BP Location: Right Arm)   Pulse 60   Temp 97.9 F (  36.6 C) (Oral)   Resp 17   SpO2 100%  Physical Exam Vitals and nursing note reviewed.  Constitutional:      General: She is not in acute distress.    Appearance: Normal appearance.  HENT:     Head: Normocephalic and atraumatic.     Nose: Nose normal.     Mouth/Throat:     Mouth: Mucous membranes are moist.     Pharynx: Oropharynx is clear.     Comments: No palpable loose teeth, no jaw tenderness to palpation Eyes:     Extraocular Movements: Extraocular movements intact.     Conjunctiva/sclera: Conjunctivae normal.     Pupils: Pupils are equal, round, and reactive to light.  Neck:     Comments: No midline neck tenderness Cardiovascular:     Rate and Rhythm: Normal rate and regular rhythm.     Heart sounds: Normal heart sounds.  Pulmonary:      Effort: Pulmonary effort is normal.     Breath sounds: Normal breath sounds.  Abdominal:     General: Abdomen is flat.     Palpations: Abdomen is soft.     Tenderness: There is no abdominal tenderness.  Musculoskeletal:     Cervical back: Normal range of motion and neck supple.     Comments: No midline back tenderness No tenderness to RUE Tenderness to L shoulder with small overlying bruise, no obvious deformity, shoulder ROM intact Pelvis stable, non-tender No bony tenderness to LLE No bony tenderness to palpation of RLE however increased pain with hip flexion and knee extension, no palpable knee joint effusion, no significant pain with internal/external rotation  Skin:    General: Skin is warm and dry.  Neurological:     General: No focal deficit present.     Mental Status: She is alert and oriented to person, place, and time.     Sensory: No sensory deficit.     Motor: No weakness.  Psychiatric:        Mood and Affect: Mood normal.        Behavior: Behavior normal.     ED Results / Procedures / Treatments   Labs (all labs ordered are listed, but only abnormal results are displayed) Labs Reviewed - No data to display  EKG None  Radiology CT Hip Right Wo Contrast Result Date: 07/31/2023 CLINICAL DATA:  Hip trauma. Fracture suspected. Mechanical fall last night. Landed on right hip. EXAM: CT OF THE RIGHT HIP WITHOUT CONTRAST TECHNIQUE: Multidetector CT imaging of the right hip was performed according to the standard protocol. Multiplanar CT image reconstructions were also generated. RADIATION DOSE REDUCTION: This exam was performed according to the departmental dose-optimization program which includes automated exposure control, adjustment of the mA and/or kV according to patient size and/or use of iterative reconstruction technique. COMPARISON:  Pelvis and right hip radiographs and right femur radiographs 07/31/2023 FINDINGS: Bones/Joint/Cartilage There is streak artifact  from total right hip arthroplasty hardware. Within this limitation, no perihardware lucency is seen to indicate hardware failure or loosening. No acute fracture is seen within the region of the right acetabulum or proximal right femur. However, there is apparent 2 mm superior cortical step-off within the junction of the right pubic body and the superior right pubic ramus (coronal series 23, images 22 through 29). There appears to be an oblique fracture line extending through the superior articular surface. There is some quantum mottling artifact and motion artifact in this region, and recommend clinical correlation for point tenderness. Ligaments  Suboptimally assessed by CT. Muscles and Tendons There is mild-to-moderate fatty infiltration and atrophy of the right gluteus minimus and medius musculature. There is chronic heterotopic bone formation superior to the right greater trochanter. Soft tissues Moderate to high-grade atherosclerotic calcifications. IMPRESSION: 1. There is apparent 2 mm superior cortical step-off within the junction of the right pubic body and the superior right pubic ramus. There appears to be an oblique fracture line extending through the superior articular surface. There is limiting artifact in this region, however this is favored to be a real fracture. Recommend clinical correlation for point tenderness at the junction the right pubic body and superior pubic ramus. 2. No acute fracture is seen within the region of the right acetabulum or proximal right femur. 3. Total right hip arthroplasty hardware without evidence of hardware failure or loosening. Electronically Signed   By: Neita Garnet M.D.   On: 07/31/2023 14:32   CT Head Wo Contrast Result Date: 07/31/2023 CLINICAL DATA:  Head trauma, minor (Age >= 65y); Neck trauma (Age >= 65y) EXAM: CT HEAD WITHOUT CONTRAST CT CERVICAL SPINE WITHOUT CONTRAST TECHNIQUE: Multidetector CT imaging of the head and cervical spine was performed  following the standard protocol without intravenous contrast. Multiplanar CT image reconstructions of the cervical spine were also generated. RADIATION DOSE REDUCTION: This exam was performed according to the departmental dose-optimization program which includes automated exposure control, adjustment of the mA and/or kV according to patient size and/or use of iterative reconstruction technique. COMPARISON:  CT head and CT cervical spine October 14 24. FINDINGS: CT HEAD FINDINGS Brain: New/interval intermediate density extra-axial fluid collection measuring 7 mm in thickness (for example see series 6, image 53 and series 7, image 51). No substantial mass effect. No evidence of acute infarction, hydrocephalus, extra-axial collection or mass lesion/mass effect. Patchy white matter hypodensities, nonspecific but compatible with chronic microvascular ischemic change. Vascular: No hyperdense vessel.  Calcific atherosclerosis. Skull: No acute fracture. Sinuses/Orbits: Clear sinuses.  No acute orbital findings. CT CERVICAL SPINE FINDINGS Alignment: Similar alignment. Skull base and vertebrae: Interval acute or subacute superior endplate fractures at T1 and T2 with mild height loss. Similar known T3 superior endplate fracture. Soft tissues and spinal canal: No prevertebral fluid or swelling. No visible canal hematoma. Disc levels: Similar multilevel degenerative change including facet and uncovertebral hypertrophy with varying degrees of neural foraminal stenosis. Degenerative disc disease greatest C3-C4 and C5-C6 Upper chest: Known 8 mm left upper lobe pulmonary nodule. IMPRESSION: CT cervical spine: 1. Interval acute or subacute superior endplate fractures at T1 and T2 with mild height loss. 2. Similar known T3 compression fracture. 3. Known 8 mm left upper lobe pulmonary nodule. CT head: Intermediate density 7 mm thick high left cerebral convexity extra-axial fluid collection which appears new since October 2024,  concerning for hemorrhage. This could be subacute in chronicity; however, acute hemorrhage is not excluded and short interval follow-up CT head is recommended to ensure stability. No significant mass effect. Findings discussed with provider Theresia Lo via telephone at 11:08 AM. Electronically Signed   By: Feliberto Harts M.D.   On: 07/31/2023 11:10   CT Cervical Spine Wo Contrast Result Date: 07/31/2023 CLINICAL DATA:  Head trauma, minor (Age >= 65y); Neck trauma (Age >= 65y) EXAM: CT HEAD WITHOUT CONTRAST CT CERVICAL SPINE WITHOUT CONTRAST TECHNIQUE: Multidetector CT imaging of the head and cervical spine was performed following the standard protocol without intravenous contrast. Multiplanar CT image reconstructions of the cervical spine were also generated. RADIATION DOSE REDUCTION: This exam  was performed according to the departmental dose-optimization program which includes automated exposure control, adjustment of the mA and/or kV according to patient size and/or use of iterative reconstruction technique. COMPARISON:  CT head and CT cervical spine October 14 24. FINDINGS: CT HEAD FINDINGS Brain: New/interval intermediate density extra-axial fluid collection measuring 7 mm in thickness (for example see series 6, image 53 and series 7, image 51). No substantial mass effect. No evidence of acute infarction, hydrocephalus, extra-axial collection or mass lesion/mass effect. Patchy white matter hypodensities, nonspecific but compatible with chronic microvascular ischemic change. Vascular: No hyperdense vessel.  Calcific atherosclerosis. Skull: No acute fracture. Sinuses/Orbits: Clear sinuses.  No acute orbital findings. CT CERVICAL SPINE FINDINGS Alignment: Similar alignment. Skull base and vertebrae: Interval acute or subacute superior endplate fractures at T1 and T2 with mild height loss. Similar known T3 superior endplate fracture. Soft tissues and spinal canal: No prevertebral fluid or swelling. No visible  canal hematoma. Disc levels: Similar multilevel degenerative change including facet and uncovertebral hypertrophy with varying degrees of neural foraminal stenosis. Degenerative disc disease greatest C3-C4 and C5-C6 Upper chest: Known 8 mm left upper lobe pulmonary nodule. IMPRESSION: CT cervical spine: 1. Interval acute or subacute superior endplate fractures at T1 and T2 with mild height loss. 2. Similar known T3 compression fracture. 3. Known 8 mm left upper lobe pulmonary nodule. CT head: Intermediate density 7 mm thick high left cerebral convexity extra-axial fluid collection which appears new since October 2024, concerning for hemorrhage. This could be subacute in chronicity; however, acute hemorrhage is not excluded and short interval follow-up CT head is recommended to ensure stability. No significant mass effect. Findings discussed with provider Theresia Lo via telephone at 11:08 AM. Electronically Signed   By: Feliberto Harts M.D.   On: 07/31/2023 11:10   DG Facial Bones 1-2 Views Result Date: 07/31/2023 CLINICAL DATA:  Mechanical fall last night. Hit left face on bedside table. History of multiple myeloma. EXAM: FACIAL BONES - 1-2 VIEW COMPARISON:  CT brain 05/25/2023 FINDINGS: The nasal bone is intact. Incidental note that the frontal sinuses are developmentally non aerated, as on prior CT. The ethmoid air cells and maxillary sinuses appear clear. The mastoid air cells appear clear. No acute fracture is seen. IMPRESSION: No acute fracture is seen. Electronically Signed   By: Neita Garnet M.D.   On: 07/31/2023 10:53   DG Hip Unilat W or Wo Pelvis 2-3 Views Right Result Date: 07/31/2023 CLINICAL DATA:  Mechanical fall last night. History of bilateral total hip arthroplasty. History of multiple myeloma. EXAM: DG HIP (WITH OR WITHOUT PELVIS) 2-3V RIGHT; RIGHT FEMUR 1 VIEW COMPARISON:  Pelvis and right hip radiographs 01/15/2020 FINDINGS: There is diffuse decreased bone mineralization. Postsurgical  changes are seen of bilateral total hip arthroplasty. The tip of the left femoral stem is not included. No perihardware lucency is seen to indicate hardware failure or loosening. Moderate heterotopic bone formation superior to the right greater trochanter is unchanged. Within the limitations of decreased bone mineralization, no acute fracture line is seen. Moderate bilateral sacroiliac subchondral sclerosis. There is cement augmentation at the superior aspect of the L5 vertebral body. IMPRESSION: 1. Postsurgical changes of bilateral total hip arthroplasty. No evidence of hardware failure or loosening. 2. Within the limitations of diffuse decreased bone mineralization, no definite acute fracture is seen. Electronically Signed   By: Neita Garnet M.D.   On: 07/31/2023 10:51   DG Femur 1V Right Result Date: 07/31/2023 CLINICAL DATA:  Mechanical fall last night. History  of bilateral total hip arthroplasty. History of multiple myeloma. EXAM: DG HIP (WITH OR WITHOUT PELVIS) 2-3V RIGHT; RIGHT FEMUR 1 VIEW COMPARISON:  Pelvis and right hip radiographs 01/15/2020 FINDINGS: There is diffuse decreased bone mineralization. Postsurgical changes are seen of bilateral total hip arthroplasty. The tip of the left femoral stem is not included. No perihardware lucency is seen to indicate hardware failure or loosening. Moderate heterotopic bone formation superior to the right greater trochanter is unchanged. Within the limitations of decreased bone mineralization, no acute fracture line is seen. Moderate bilateral sacroiliac subchondral sclerosis. There is cement augmentation at the superior aspect of the L5 vertebral body. IMPRESSION: 1. Postsurgical changes of bilateral total hip arthroplasty. No evidence of hardware failure or loosening. 2. Within the limitations of diffuse decreased bone mineralization, no definite acute fracture is seen. Electronically Signed   By: Neita Garnet M.D.   On: 07/31/2023 10:51   DG Shoulder  Left Result Date: 07/31/2023 CLINICAL DATA:  Fall last night.  History of multiple myeloma. EXAM: LEFT SHOULDER - 2+ VIEW COMPARISON:  Left shoulder radiographs 12/29/2022, CT left shoulder 01/06/2023 FINDINGS: Redemonstration of reverse total left shoulder arthroplasty. No perihardware lucency is seen to indicate hardware failure or loosening. There is again chronic heterotopic ossification extending inferiorly from the region of the acromion, measuring up to approximately 7 mm in transverse dimension and 3.6 cm in craniocaudal length. Mild-to-moderate acromioclavicular joint space narrowing and peripheral osteophytosis. No definite acute fracture is seen. IMPRESSION: 1. Reverse total left shoulder arthroplasty without evidence of hardware failure. 2. No definite acute fracture. 3. Chronic heterotopic ossification extending inferiorly from the region of the acromion. Electronically Signed   By: Neita Garnet M.D.   On: 07/31/2023 10:48    Procedures Procedures    Medications Ordered in ED Medications  acetaminophen (TYLENOL) tablet 650 mg (650 mg Oral Given 07/31/23 1104)    ED Course/ Medical Decision Making/ A&P Clinical Course as of 07/31/23 1529  Fri Jul 31, 2023  1057 No definitive fracture seen on XR. Will recommended CT hip to further evaluate for occult fracture. [VK]  1113 I received call from radiology, [facility] concerning for acute to subacute hemorrhage, age indeterminant fracture of T1/T2. Will plan to discuss with neurosurgery. [VK]  1206 I spoke Dr. Danielle Dess with neurosurgery who states does not need any specific management or repeat imaging as injuries likely subacute to chronic. [VK]  1436 Suspected R pubic rami/pubic body fracture on CT of hip. [VK]  1502 Patient would prefer discharge back to her assisted living. Will speak with TOC to see if we can set up home health/home PT. She is on narcotics for pain control at home. [VK]    Clinical Course User Index [VK] Rexford Maus, DO                                 Medical Decision Making This patient presents to the ED with chief complaint(s) of fall with pertinent past medical history of multiple myeloma, HTN, hypothyroidism, prior DVT no longer on [facility] which further complicates the presenting complaint. The complaint involves an extensive differential diagnosis and also carries with it a high risk of complications and morbidity.    The differential diagnosis includes due to patient's age and mechanism of trauma, concern for ICH, mass effect, cervical spine fracture, shoulder fracture, hip fracture, hardware dysfunction  Additional history obtained: Additional history obtained from spouse Records reviewed  outpatient oncology records  ED Course and Reassessment: On patient's arrival she is hemodynamically stable in no acute distress.  Patient was initially evaluated by triage and had hip and facial x-rays ordered.  Will additionally have head CT and C-spine, right femur and left shoulder x-rays.  She was given Tylenol for pain control and will be closely reassessed.  Independent labs interpretation:  N/A  Independent visualization of imaging: - I independently visualized the following imaging with scope of interpretation limited to determining acute life threatening conditions related to emergency care: CTH/C-spine, L shoulder XR, R hip/femur XR, R hip CT, which revealed subacute to chronic ICH, subacute T1/T2 fracture, old T3 fracture, R pubic rami/pubis fracture  Consultation: - Consulted or discussed management/test interpretation w/ external professional: neurosurgery, TOC  Consideration for admission or further workup: Patient has no emergent conditions requiring admission or further work-up at this time and is stable for discharge home with primary care and orthopedic follow-up  Social Determinants of health: N/a    Amount and/or Complexity of Data Reviewed Radiology: ordered.  Risk OTC  drugs.          Final Clinical Impression(s) / ED Diagnoses Final diagnoses:  Closed fracture of ramus of right pubis, initial encounter (HCC)  Fall, initial encounter  Closed wedge compression fracture of T1 vertebra, initial encounter [facility])    Rx / DC Orders ED Discharge Orders          Ordered    Home Health        07/31/23 1522    Face-to-face encounter (required for Medicare/Medicaid patients)       Comments: I Rexford Maus certify that this patient is under my care and that I, or a nurse practitioner or physician's assistant working with me, had a face-to-face encounter that meets the physician face-to-face encounter requirements with this patient on 07/31/2023. The encounter with the patient was in whole, or in part for the following medical condition(s) which is the primary reason for home health care (List medical condition): new pubic rami/pelvic fracture   07/31/23 1522              Rexford Maus, DO 07/31/23 1529

## 2023-07-31 NOTE — Progress Notes (Signed)
[facility] [phone]   [phone]  PROGRESS NOTE  Patient Care Team: [name] Paradise, MD as PCP - General (Internal Medicine)  Hematological/Oncological History # IgG Lambda Multiple Myeloma 06/04/2021: WBC 5.5, Hgb 11.1, MCV 101.3, Plt 306 07/24/2021: M protein 2.3, IFE shows IgG Lambda monoclonal specificity. Cr 0.9 08/07/2021: Establish care with Dr. Leonides Schanz 09/27/2021: Bmbx showed hypercellular bone marrow with plasma cell neoplasm, increased number of atypical plasma cells averaging 20% of all cells in the aspirate 10/18/2021: Cycle 1 of Velcade/Dexamethasone 11/08/2021: Cycle 2 of Velcade/Dexamethasone 11/29/2021: Cycle 3 of Velcade/Dexamethasone 12/20/2021: Cycle 4 of Velcade/Dexamethasone 01/10/2022: Cycle 5 of Velcade/Dexamethasone 01/31/2022: Cycle 6 of Velcade/Dexamethasone 02/28/2022: Cycle 7 of Velcade/Dexamethasone. Dose reduced velcade due to neuropathy  03/14/2022: Cycle 1 Day 1 of Dara/Dex (velcade d/c due to neuropathy).  04/11/2022: Cycle 2 Day 1 of Dara/Dex 05/09/2022: Cycle 3 Day 1 of Dara/Dex 06/06/2022: Cycle 4 Day 1 of Dara/Dex 07/04/2022: Cycle 5 Day 1 of Dara/Dex 08/01/2022: Cycle 6 Day 1 of Dara/Dex 08/29/2022: Cycle 7 Day 1 of Dara/Dex 09/26/2022: Cycle 8 Day 1 of Dara/Dex 10/24/2022: Cycle 9 Day 1 of Dara/Dex 11/21/2022: Cycle 10 Day 1 of Dara/Dex 01/15/2023: Cycle 11 Day 1 of Dara/Dex 02/13/2023: Cycle 12 Day 1 of Dara/Dex 03/12/2023: Cycle 13 Day 1 of Dara/Dex 04/10/2023: Cycle 14 Day 1 of Dara/Rev/Dex (revlimid started due to rising M Protein 0.6)  05/08/2023:  Cycle 15 Day 1 of Dara/Rev/Dex 06/05/2023: Cycle 16 Day 1 of Dara/Dex (holding Revlimid due to poor tolerance and cytopenias) 07/02/2023: Cycle 17 Day 1 of Dara/Dex.  Patient notes she wants to be off Revlimid indefinitely.  Interval History:  [name] Page 89 y.o. female with medical history significant for  IgG lambda multiple myeloma who presents for a follow up visit.  The patient's last visit was on 07/02/2023. In the interim the patient had a fall from standing yesterday.  On exam today [name] Page reports she had a fall from standing yesterday when she took a few steps away from her walker.  She reports that she fell and hit her jaw on the bedside table and her right hip on the floor.  She reports that she is still having some pain in her right hip and is about 8 out of 10 in severity.  She is not able to bear weight on that hip.  She reports that she did not hit her head and is not having any pain elsewhere.  She reports that she has not had any infectious symptoms such as runny nose, sore throat, or cough.  She reports she is tolerating the Darzalex well with no nausea, vomiting, or diarrhea.  She reports that she has been losing weight and is unsure why.  She is about 5 pounds down from her last visit.  She denies fevers, chills, night sweats, shortness of breath, chest pain or cough.  She has no other complaints. Rest of the 10 point ROS is below.  Due to concern for her fall and hip pain we will transfer her to the emergency department for further evaluation and management.  MEDICAL HISTORY:  Past Medical History:  Diagnosis Date   Alopecia 10/28/2016   Anginal pain (HCC)    Anxiety    Arthritis    Cancer (HCC)    Cervical spondylosis    C3-4 AND C4-5   Chest pain 07/28/2008   H/O, normal stress nuclear EF 78%   Chronic cough 07/12/2014   Collapsed lung 07/2010   being  treated at [facility] - in left lung   Complication of anesthesia    Cough 07/30/2010   Qualifier: Diagnosis of  By: Marchelle Gearing MD, Murali     Cystocele 05/23/2015   Cystocele, midline 05/02/2013   DEEP VEIN THROMBOSIS/PHLEBITIS 07/30/2010   Qualifier: History of  By: Yancey Flemings CMA, Jennifer     Dislocation closed, shoulder 02/2013   DVT (deep venous thrombosis) (HCC) 2006   Encounter for therapeutic drug monitoring 10/03/2014   GERD (gastroesophageal reflux disease)    occ    Headache    hx   High cholesterol    History of recurrent UTIs 09/24/2011   HYPERLIPIDEMIA 07/30/2010   Qualifier: Diagnosis of  By: Yancey Flemings CMA, Jennifer     Hypertension    HYPERTENSION 07/30/2010   Qualifier: Diagnosis of  By: Yancey Flemings CMA, Jennifer     Hypothyroidism    Nasal itching 01/30/2014   Osteopenia 05/23/2015   Peripheral vascular disease (HCC) 2006   dvt's and 50 yrs ago   Pleuritic pain 12/17/2015   PONV (postoperative nausea and vomiting)    Primary localized osteoarthritis of right hip 08/26/2016   Primary osteoarthritis of right hip 08/26/2016   Pulmonary collapse 07/30/2010   Qualifier: Diagnosis of  By: Marchelle Gearing MD, Murali     PULMONARY NODULE 07/30/2010   Qualifier: Diagnosis of  By: Marchelle Gearing MD, Murali     Rash and nonspecific skin eruption 01/02/2015   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   Sarcoidosis    Sarcoidosis of lung (HCC)    Vaginal atrophy 09/24/2011   Visual field defect nasal step 01/02/2015    SURGICAL HISTORY: Past Surgical History:  Procedure Laterality Date   BACK SURGERY  2022   EYE SURGERY     FOOT SURGERY Left 06/2005   bunion hammer toe   HEMORRHOID SURGERY  12/1972   INTRAOCULAR LENS INSERTION     right eye 10 /16 and left 06/16/2005   JOINT REPLACEMENT     KYPHOPLASTY N/A 06/27/2021   Procedure: LUMBAR 3 AND LUMBAR  5 KYPHOPLASTY;  Surgeon: Estill Bamberg, MD;  Location: [facility] OR;  Service: Orthopedics;  Laterality: N/A;   KYPHOPLASTY N/A 09/04/2021   Procedure: THORACIC SEVEN, THORACIC EIGHT, THORACIC NINE, THORACIC TEN KYPHOPLASTY;  Surgeon: Estill Bamberg, MD;  Location: [facility] OR;  Service: Orthopedics;  Laterality: N/A;   REVERSE SHOULDER ARTHROPLASTY Left 12/05/2022   Procedure: REVERSE SHOULDER ARTHROPLASTY;  Surgeon: Beverely Low, MD;  Location: [facility] OR[facility];  Service: Orthopedics;  Laterality: Left;  CHOICE WITH INTERSCALENE BLOCK   SKIN BIOPSY  01/2007   basal cell carcinoma removed from right lower leg    TOTAL HIP  ARTHROPLASTY  06/2007   left   TOTAL HIP ARTHROPLASTY Right 08/26/2016   Procedure: TOTAL HIP ARTHROPLASTY ANTERIOR APPROACH;  Surgeon: Marcene Corning, MD;  Location: [facility] OR;  Service: Orthopedics;  Laterality: Right;   TOTAL KNEE ARTHROPLASTY Right 10/2007   right    SOCIAL HISTORY: Social History   Socioeconomic History   Marital status: Married    Spouse name: Not on file   Number of children: Not on file   Years of education: Not on file   Highest education level: Not on file  Occupational History   Occupation: retired    Associate Professor: RETIRED  Tobacco Use   Smoking status: Never   Smokeless tobacco: Never  Vaping Use   Vaping status: Never Used  Substance and Sexual Activity   Alcohol use: Not Currently    Comment: BEER/glass of wine  A DAY   Drug use: No   Sexual activity: Never  Other Topics Concern   Not on file  Social History Narrative   Not on file   Social Drivers of Health   Financial Resource Strain: Not on file  Food Insecurity: No Food Insecurity (12/05/2022)   Hunger Vital Sign    Worried About Running Out of Food in the Last Year: Never true    Ran Out of Food in the Last Year: Never true  Transportation Needs: No Transportation Needs (12/05/2022)   PRAPARE - Administrator, Civil Service (Medical): No    Lack of Transportation (Non-Medical): No  Physical Activity: Not on file  Stress: Not on file  Social Connections: Not on file  Intimate Partner Violence: Not At Risk (12/05/2022)   Humiliation, Afraid, Rape, and Kick questionnaire    Fear of Current or Ex-Partner: No    Emotionally Abused: No    Physically Abused: No    Sexually Abused: No    FAMILY HISTORY: Family History  Problem Relation Age of Onset   Diabetes Mother    Heart failure Mother    Hypertension Father    Diabetes Father    Heart disease Father    Heart attack Father     ALLERGIES:  has no known allergies.  MEDICATIONS:  Current Outpatient Medications   Medication Sig Dispense Refill   acyclovir (ZOVIRAX) 400 MG tablet TAKE ONE (1) TABLET BY MOUTH TWO (2) TIMES DAILY 60 tablet 2   amLODipine (NORVASC) 10 MG tablet Take 10 mg by mouth daily.     b complex vitamins capsule Take 1 capsule by mouth daily.     furosemide (LASIX) 20 MG tablet Take 20 mg by mouth daily as needed for edema.     gemfibrozil (LOPID) 600 MG tablet Take 600 mg by mouth 2 (two) times daily before a meal.     HYDROcodone-acetaminophen (NORCO/VICODIN) 5-325 MG tablet TAKE 1 TABLET BY MOUTH EVERY 6 HOURS AS NEEDED FOR MODERATE PAIN 60 tablet 0   levothyroxine (SYNTHROID) 50 MCG tablet Take 50 mcg by mouth daily.     LORazepam (ATIVAN) 1 MG tablet Take 0.5 mg by mouth at bedtime as needed for anxiety.     losartan (COZAAR) 50 MG tablet Take 50 mg by mouth daily.     Multiple Vitamin (MULTIVITAMIN) capsule Take 1 capsule by mouth daily.     naproxen sodium (ALEVE) 220 MG tablet Take 440 mg by mouth daily as needed (pain).     nitrofurantoin (MACRODANTIN) 100 MG capsule daily.     pantoprazole (PROTONIX) 20 MG tablet Take 40 mg by mouth daily at 12 noon.     pregabalin (LYRICA) 25 MG capsule TAKE 1 CAPSULE BY MOUTH TWICE DAILY 60 capsule 1   No current facility-administered medications for this visit.    REVIEW OF SYSTEMS:   Constitutional: ( - ) fevers, ( - )  chills , ( - ) night sweats Eyes: ( - ) blurriness of vision, ( - ) double vision, ( - ) watery eyes Ears, nose, mouth, throat, and face: ( - ) mucositis, ( - ) sore throat Respiratory: ( - ) cough, ( - ) dyspnea, ( - ) wheezes Cardiovascular: ( - ) palpitation, ( - ) chest discomfort, ( - ) lower extremity swelling Gastrointestinal:  ( - ) nausea, ( - ) heartburn, ( - ) change in bowel habits Skin: ( - ) abnormal skin rashes Lymphatics: ( - )  new lymphadenopathy, ( - ) easy bruising Neurological: (+ ) numbness, ( +) tingling, ( - ) new weaknesses Behavioral/Psych: ( - ) mood change, ( - ) new changes  All other  systems were reviewed with the patient and are negative.  PHYSICAL EXAMINATION: ECOG PERFORMANCE STATUS: 1 - Symptomatic but completely ambulatory  Vitals:   07/31/23 0829  BP: (!) 109/45  Pulse: 66  Resp: 18  Temp: 98.7 F (37.1 C)  SpO2: 100%         Filed Weights   07/31/23 0829  Weight: 102 lb (46.3 kg)        GENERAL: Well-appearing elderly Caucasian female, alert, no distress and comfortable SKIN: skin color, texture, turgor are normal, no rashes or significant lesions EYES: conjunctiva are pink and non-injected, sclera clear LUNGS: clear to auscultation and percussion with normal breathing effort HEART: regular rate & rhythm and no murmurs and no lower extremity edema Musculoskeletal: no cyanosis of digits and no clubbing  PSYCH: alert & oriented x 3, fluent speech NEURO: no focal motor/sensory deficits  LABORATORY DATA:  I have reviewed the data as listed    Latest Ref Rng & Units 07/31/2023    8:08 AM 07/02/2023    8:51 AM 06/05/2023    9:56 AM  CBC  WBC 4.0 - 10.5 K/uL 4.6  2.5  2.1   Hemoglobin 12.0 - 15.0 g/dL 8.5  8.9  9.1   Hematocrit 36.0 - 46.0 % 26.0  26.4  25.5   Platelets 150 - 400 K/uL 232  235  214        Latest Ref Rng & Units 07/31/2023    8:08 AM 07/02/2023    8:51 AM 06/05/2023    9:56 AM  CMP  Glucose 70 - 99 mg/dL 98  92  76   BUN 8 - 23 mg/dL 14  14  9    Creatinine 0.44 - 1.00 mg/dL 2.95  6.21  3.08   Sodium 135 - 145 mmol/L 137  135  131   Potassium 3.5 - 5.1 mmol/L 3.8  3.6  3.8   Chloride 98 - 111 mmol/L 107  105  101   CO2 22 - 32 mmol/L 23  24  24    Calcium 8.9 - 10.3 mg/dL 8.8  8.8  8.7   Total Protein 6.5 - 8.1 g/dL 6.5  6.8  6.9   Total Bilirubin <1.2 mg/dL 0.4  0.3  0.4   Alkaline Phos 38 - 126 U/L 88  102  100   AST 15 - 41 U/L 18  17  17    ALT 0 - 44 U/L 7  7  9      Lab Results  Component Value Date   MPROTEIN 0.5 (H) 07/02/2023   MPROTEIN 0.4 (H) 06/05/2023   MPROTEIN 0.5 (H) 05/08/2023   Lab  Results  Component Value Date   KPAFRELGTCHN 5.3 07/02/2023   KPAFRELGTCHN 7.4 06/05/2023   KPAFRELGTCHN 8.3 05/08/2023   LAMBDASER 5.8 07/02/2023   LAMBDASER 7.7 06/05/2023   LAMBDASER 8.3 05/08/2023   KAPLAMBRATIO 0.91 07/02/2023   KAPLAMBRATIO 0.96 06/05/2023   KAPLAMBRATIO 1.00 05/08/2023   RADIOGRAPHIC STUDIES: No results found.  ASSESSMENT & PLAN [name] Page 89 y.o. female with medical history significant for newly diagnosed IgG lambda multiple myeloma who presents for a follow up visit.  # Hip Pain # S/p Mechanical Fall -- Patient reports she had a fall from standing yesterday when she took a few steps away from her  walker.  She hit her jaw and right hip. -- She is currently not able to weight-bear on her right hip and is having 8/10 pain.  -- Will transfer her to emergency department for further evaluation due to concern for possible hip fracture.    # IgG Lambda Multiple Myeloma -- Diagnosis confirmed by bone marrow biopsy on 09/27/2021 which shows evidence of 20% involvement by plasma cells.  Additionally has anemia, macrocytosis, leukopenia, and concern for lytic lesions on bone imaging --Prognostic panel shows a deletion 17 with a TP53 mutation --Started Cycle 1 Day 1 of Velcade/Dex on 10/18/2021 --Switched to Dara/Dex on 03/14/2022 due to neuropathy secondary to Velcade.  Plan: --Due for Cycle 18 Day 1 of Dara/Dex today. Will HOLD due to concern for hip fracture.  --Labs today show white blood cell count 4.6, hemoglobin 8.5, MCV 93.2, platelets 232. creatinine and LFTs normal.  --Most recent myeloma labs from 07/02/2023 showed M protein 0.5, kappa 5.3, lambda 5.8, ratio 0.91 --Due to poor tolerance and cytopenias, patient notes that she would like to discontinue Revlimid and does not wish to restart in the future. --Precautions given for neutropenia including monitor for fevers, chills, etc.  --Discussed her rising M protein on Darzalex therapy.  The patient notes she  would like to continue her Darzalex for now and if her numbers were to worsen we could consider altering her therapy.  She voiced understanding of the risks and benefits of this approach.  I would consider carfilzomib as next treatment option. --RTC in 4 weeks with labs, follow up visit and Dara/Dex treatments.  Will reschedule today shot based on emergency department outcome.  # H/O Confusion --decreased dexamethasone to 20 mg on infusion days.  --Symptoms resolved.   #Leukopenia/Macrocytic anemia/Vitamin B12 deficiency: --Secondary to chemotherapy and multiple myeloma --Received vitamin B12 injection 1000 mcg weekly x 4 doses. Continue q 4 weeks.  -- continue p.o. supplementation.  #Right rib pain, improved: --CT chest from 10/25/2021 that didn't show any new or progressive disease.   #Abdominal cramping--resolved: --Symptoms occur after velcade injection.  --Has Bentyl as needed  #Neuropathy: --Grade 2 involving finger tips and feet --Secondary to velcade  --Currently on Lyrica 25 mg PO BID.   #Supportive Care -- chemotherapy education complete -- port placement not required.  -- zofran 8mg  q8H PRN and compazine 10mg  PO q6H for nausea -- acyclovir 400mg  PO BID for VCZ prophylaxis.  -- Await dental clearance to start Zometa  No orders of the defined types were placed in this encounter.  All questions were answered. The patient knows to call the clinic with any problems, questions or concerns.  I have spent a total of 30 minutes minutes of face-to-face and non-face-to-face time, preparing to see the patient, performing a medically appropriate examination, counseling and educating the patient, ordering medications, documenting clinical information in the electronic health record,  and care coordination.   Ulysees Barns, MD Department of Hematology/Oncology [facility] Phone: [phone] Pager: [phone] Email:  [email or web address]  07/31/2023 9:25 AM

## 2023-07-31 NOTE — Discharge Instructions (Addendum)
You were seen in the emergency department after your fall.  You did break part of your pelvis called the pubic ramus.  This is a stable fracture that you can walk on. You had fractures in your spine that are likely a few weeks to months old as well as old blood in your brain. This does not need any specific management and you can follow up with your PCP and neurosurgery as needed. You can continue to take Tylenol every 6 hours as needed for pain as well as your home Norco for breakthrough pain.  We have set up home health and home physical therapy to work with you at home.  You should follow-up with your orthopedic doctor to have your symptoms rechecked and for further management of your fracture.  You should return to the emergency department if you have significantly worsening pain and are unable to walk or if you have any other new or concerning symptoms.

## 2023-08-03 DIAGNOSIS — C9000 Multiple myeloma not having achieved remission: Secondary | ICD-10-CM | POA: Diagnosis not present

## 2023-08-03 DIAGNOSIS — S329XXD Fracture of unspecified parts of lumbosacral spine and pelvis, subsequent encounter for fracture with routine healing: Secondary | ICD-10-CM | POA: Diagnosis not present

## 2023-08-03 DIAGNOSIS — S81811A Laceration without foreign body, right lower leg, initial encounter: Secondary | ICD-10-CM | POA: Diagnosis not present

## 2023-08-03 DIAGNOSIS — W19XXXA Unspecified fall, initial encounter: Secondary | ICD-10-CM | POA: Diagnosis not present

## 2023-08-03 LAB — KAPPA/LAMBDA LIGHT CHAINS
Kappa free light chain: 5.1 mg/L (ref 3.3–19.4)
Kappa, lambda light chain ratio: 0.86 (ref 0.26–1.65)
Lambda free light chains: 5.9 mg/L (ref 5.7–26.3)

## 2023-08-07 ENCOUNTER — Telehealth: Payer: Self-pay | Admitting: *Deleted

## 2023-08-07 NOTE — Telephone Encounter (Signed)
Received vm message from pt's husband stating that pt is still having pain from pelvis fracture sustained after a fall. He states the Hydrocodone 5-325 is not strong enough for pain relief. TCT husband. No answer but was able to leave detailed message on identified phone vm.  Advised to take 2 hydrocodone 5/325 instead of 1 per Dr. Leonides Schanz recommendation. If he needs more medication advised to call back so we can refill it. Also advised to call [facility] so that she can be evaluated as she has not been to orthopedist yet. Provided contact information.

## 2023-08-13 ENCOUNTER — Other Ambulatory Visit: Payer: Self-pay | Admitting: Hematology and Oncology

## 2023-08-13 DIAGNOSIS — C9000 Multiple myeloma not having achieved remission: Secondary | ICD-10-CM

## 2023-08-14 ENCOUNTER — Telehealth: Payer: Self-pay | Admitting: *Deleted

## 2023-08-14 ENCOUNTER — Encounter: Payer: Self-pay | Admitting: Hematology and Oncology

## 2023-08-14 ENCOUNTER — Other Ambulatory Visit: Payer: Self-pay | Admitting: Hematology and Oncology

## 2023-08-14 DIAGNOSIS — C9000 Multiple myeloma not having achieved remission: Secondary | ICD-10-CM

## 2023-08-14 LAB — MULTIPLE MYELOMA PANEL, SERUM
Albumin SerPl Elph-Mcnc: 3.4 g/dL (ref 2.9–4.4)
Albumin/Glob SerPl: 1.4 (ref 0.7–1.7)
Alpha 1: 0.2 g/dL (ref 0.0–0.4)
Alpha2 Glob SerPl Elph-Mcnc: 0.7 g/dL (ref 0.4–1.0)
B-Globulin SerPl Elph-Mcnc: 1.5 g/dL — ABNORMAL HIGH (ref 0.7–1.3)
Gamma Glob SerPl Elph-Mcnc: 0.2 g/dL — ABNORMAL LOW (ref 0.4–1.8)
Globulin, Total: 2.6 g/dL (ref 2.2–3.9)
IgA: 12 mg/dL — ABNORMAL LOW (ref 64–422)
IgG (Immunoglobin G), Serum: 814 mg/dL (ref 586–1602)
IgM (Immunoglobulin M), Srm: 20 mg/dL — ABNORMAL LOW (ref 26–217)
M Protein SerPl Elph-Mcnc: 0.6 g/dL — ABNORMAL HIGH
Total Protein ELP: 6 g/dL (ref 6.0–8.5)

## 2023-08-14 MED ORDER — HYDROCODONE-ACETAMINOPHEN 5-325 MG PO TABS: 1.0000 | ORAL_TABLET | Freq: Four times a day (QID) | ORAL | 0 refills | Status: DC | PRN

## 2023-08-14 NOTE — Telephone Encounter (Signed)
 Received call from pt's husband.  He states [name] Page needs refill of her Hydrocodone. Last filled on 07/27/23 for # 60 @ [facility]. Dr. Leonides Schanz made aware.

## 2023-08-19 ENCOUNTER — Telehealth: Payer: Self-pay | Admitting: *Deleted

## 2023-08-19 NOTE — Telephone Encounter (Signed)
 Received call from pt's husband stating that [name] would not be able to come in next week for treatment appts as she is still having pain from a fall she had a little while ago. [name] states the hydrocodone  is helping with the pain. She is supposed to be starting PT soon and hopefully she will be more mobile as she progresses through that. Dr, federico made aware.

## 2023-08-20 DIAGNOSIS — M546 Pain in thoracic spine: Secondary | ICD-10-CM | POA: Diagnosis not present

## 2023-08-20 DIAGNOSIS — S32591A Other specified fracture of right pubis, initial encounter for closed fracture: Secondary | ICD-10-CM | POA: Diagnosis not present

## 2023-08-24 ENCOUNTER — Telehealth: Payer: Self-pay | Admitting: *Deleted

## 2023-08-24 DIAGNOSIS — M6281 Muscle weakness (generalized): Secondary | ICD-10-CM | POA: Diagnosis not present

## 2023-08-24 DIAGNOSIS — M25551 Pain in right hip: Secondary | ICD-10-CM | POA: Diagnosis not present

## 2023-08-24 DIAGNOSIS — R262 Difficulty in walking, not elsewhere classified: Secondary | ICD-10-CM | POA: Diagnosis not present

## 2023-08-24 NOTE — Telephone Encounter (Signed)
 Received m message from pt's husband. He states that pt is still in considerable pain from recent fall/pelvic fracture.  He has been giving her 2 hydrocodone /apap 3-4 x a day along with Naprosyn  but this is not controlling her pain. In addition, her husband is concerned about the amount of tylenol  she is getting.  Dr. Federico made aware.

## 2023-08-26 ENCOUNTER — Emergency Department (HOSPITAL_COMMUNITY): Payer: Medicare PPO

## 2023-08-26 ENCOUNTER — Inpatient Hospital Stay (HOSPITAL_COMMUNITY)
Admission: EM | Admit: 2023-08-26 | Discharge: 2023-08-31 | DRG: 082 | Disposition: A | Discharge status: Hospice | Payer: Medicare PPO | Attending: Internal Medicine | Admitting: Internal Medicine

## 2023-08-26 ENCOUNTER — Encounter (HOSPITAL_COMMUNITY): Payer: Self-pay | Admitting: Internal Medicine

## 2023-08-26 ENCOUNTER — Telehealth: Payer: Self-pay | Admitting: *Deleted

## 2023-08-26 ENCOUNTER — Other Ambulatory Visit: Payer: Self-pay | Admitting: Hematology and Oncology

## 2023-08-26 ENCOUNTER — Other Ambulatory Visit: Payer: Self-pay

## 2023-08-26 DIAGNOSIS — I739 Peripheral vascular disease, unspecified: Secondary | ICD-10-CM | POA: Diagnosis present

## 2023-08-26 DIAGNOSIS — R296 Repeated falls: Secondary | ICD-10-CM | POA: Diagnosis present

## 2023-08-26 DIAGNOSIS — S06A0XA Traumatic brain compression without herniation, initial encounter: Secondary | ICD-10-CM | POA: Diagnosis present

## 2023-08-26 DIAGNOSIS — R54 Age-related physical debility: Secondary | ICD-10-CM | POA: Diagnosis present

## 2023-08-26 DIAGNOSIS — G8191 Hemiplegia, unspecified affecting right dominant side: Secondary | ICD-10-CM | POA: Diagnosis present

## 2023-08-26 DIAGNOSIS — Z96612 Presence of left artificial shoulder joint: Secondary | ICD-10-CM | POA: Diagnosis not present

## 2023-08-26 DIAGNOSIS — Z66 Do not resuscitate: Secondary | ICD-10-CM | POA: Diagnosis not present

## 2023-08-26 DIAGNOSIS — Z471 Aftercare following joint replacement surgery: Secondary | ICD-10-CM | POA: Diagnosis not present

## 2023-08-26 DIAGNOSIS — G894 Chronic pain syndrome: Secondary | ICD-10-CM | POA: Diagnosis present

## 2023-08-26 DIAGNOSIS — R471 Dysarthria and anarthria: Secondary | ICD-10-CM | POA: Diagnosis present

## 2023-08-26 DIAGNOSIS — C9000 Multiple myeloma not having achieved remission: Secondary | ICD-10-CM | POA: Diagnosis not present

## 2023-08-26 DIAGNOSIS — E876 Hypokalemia: Secondary | ICD-10-CM | POA: Diagnosis present

## 2023-08-26 DIAGNOSIS — M1611 Unilateral primary osteoarthritis, right hip: Secondary | ICD-10-CM | POA: Diagnosis present

## 2023-08-26 DIAGNOSIS — Z833 Family history of diabetes mellitus: Secondary | ICD-10-CM

## 2023-08-26 DIAGNOSIS — M8580 Other specified disorders of bone density and structure, unspecified site: Secondary | ICD-10-CM | POA: Diagnosis present

## 2023-08-26 DIAGNOSIS — Y92099 Unspecified place in other non-institutional residence as the place of occurrence of the external cause: Secondary | ICD-10-CM | POA: Diagnosis not present

## 2023-08-26 DIAGNOSIS — R4701 Aphasia: Secondary | ICD-10-CM | POA: Diagnosis present

## 2023-08-26 DIAGNOSIS — Z515 Encounter for palliative care: Secondary | ICD-10-CM

## 2023-08-26 DIAGNOSIS — J9811 Atelectasis: Secondary | ICD-10-CM | POA: Diagnosis not present

## 2023-08-26 DIAGNOSIS — I4510 Unspecified right bundle-branch block: Secondary | ICD-10-CM | POA: Diagnosis present

## 2023-08-26 DIAGNOSIS — E7800 Pure hypercholesterolemia, unspecified: Secondary | ICD-10-CM | POA: Diagnosis present

## 2023-08-26 DIAGNOSIS — R627 Adult failure to thrive: Secondary | ICD-10-CM | POA: Diagnosis present

## 2023-08-26 DIAGNOSIS — I10 Essential (primary) hypertension: Secondary | ICD-10-CM

## 2023-08-26 DIAGNOSIS — R4781 Slurred speech: Secondary | ICD-10-CM | POA: Diagnosis not present

## 2023-08-26 DIAGNOSIS — Z96643 Presence of artificial hip joint, bilateral: Secondary | ICD-10-CM | POA: Diagnosis not present

## 2023-08-26 DIAGNOSIS — R531 Weakness: Secondary | ICD-10-CM | POA: Diagnosis not present

## 2023-08-26 DIAGNOSIS — Z85828 Personal history of other malignant neoplasm of skin: Secondary | ICD-10-CM

## 2023-08-26 DIAGNOSIS — W19XXXA Unspecified fall, initial encounter: Secondary | ICD-10-CM | POA: Diagnosis present

## 2023-08-26 DIAGNOSIS — Z96651 Presence of right artificial knee joint: Secondary | ICD-10-CM | POA: Diagnosis present

## 2023-08-26 DIAGNOSIS — I4891 Unspecified atrial fibrillation: Secondary | ICD-10-CM | POA: Diagnosis present

## 2023-08-26 DIAGNOSIS — M5021 Other cervical disc displacement,  high cervical region: Secondary | ICD-10-CM | POA: Diagnosis not present

## 2023-08-26 DIAGNOSIS — S065X0A Traumatic subdural hemorrhage without loss of consciousness, initial encounter: Secondary | ICD-10-CM | POA: Diagnosis not present

## 2023-08-26 DIAGNOSIS — S065XAA Traumatic subdural hemorrhage with loss of consciousness status unknown, initial encounter: Principal | ICD-10-CM

## 2023-08-26 DIAGNOSIS — R4189 Other symptoms and signs involving cognitive functions and awareness: Secondary | ICD-10-CM | POA: Diagnosis present

## 2023-08-26 DIAGNOSIS — Z79899 Other long term (current) drug therapy: Secondary | ICD-10-CM

## 2023-08-26 DIAGNOSIS — K219 Gastro-esophageal reflux disease without esophagitis: Secondary | ICD-10-CM | POA: Diagnosis present

## 2023-08-26 DIAGNOSIS — E039 Hypothyroidism, unspecified: Secondary | ICD-10-CM | POA: Diagnosis present

## 2023-08-26 DIAGNOSIS — Z8249 Family history of ischemic heart disease and other diseases of the circulatory system: Secondary | ICD-10-CM

## 2023-08-26 DIAGNOSIS — D860 Sarcoidosis of lung: Secondary | ICD-10-CM | POA: Diagnosis present

## 2023-08-26 DIAGNOSIS — I959 Hypotension, unspecified: Secondary | ICD-10-CM | POA: Diagnosis not present

## 2023-08-26 DIAGNOSIS — Z043 Encounter for examination and observation following other accident: Secondary | ICD-10-CM | POA: Diagnosis not present

## 2023-08-26 DIAGNOSIS — Z96641 Presence of right artificial hip joint: Secondary | ICD-10-CM | POA: Diagnosis present

## 2023-08-26 DIAGNOSIS — M9961 Osseous and subluxation stenosis of intervertebral foramina of cervical region: Secondary | ICD-10-CM | POA: Diagnosis not present

## 2023-08-26 DIAGNOSIS — Z9181 History of falling: Secondary | ICD-10-CM

## 2023-08-26 DIAGNOSIS — Z79890 Hormone replacement therapy (postmenopausal): Secondary | ICD-10-CM

## 2023-08-26 DIAGNOSIS — I609 Nontraumatic subarachnoid hemorrhage, unspecified: Secondary | ICD-10-CM | POA: Diagnosis not present

## 2023-08-26 DIAGNOSIS — S199XXA Unspecified injury of neck, initial encounter: Secondary | ICD-10-CM | POA: Diagnosis not present

## 2023-08-26 DIAGNOSIS — M47812 Spondylosis without myelopathy or radiculopathy, cervical region: Secondary | ICD-10-CM | POA: Diagnosis not present

## 2023-08-26 LAB — DIFFERENTIAL
Abs Immature Granulocytes: 0.03 10*3/uL (ref 0.00–0.07)
Basophils Absolute: 0 10*3/uL (ref 0.0–0.1)
Basophils Relative: 0 %
Eosinophils Absolute: 0.1 10*3/uL (ref 0.0–0.5)
Eosinophils Relative: 1 %
Immature Granulocytes: 0 %
Lymphocytes Relative: 14 %
Lymphs Abs: 1 10*3/uL (ref 0.7–4.0)
Monocytes Absolute: 0.7 10*3/uL (ref 0.1–1.0)
Monocytes Relative: 10 %
Neutro Abs: 5.3 10*3/uL (ref 1.7–7.7)
Neutrophils Relative %: 75 %

## 2023-08-26 LAB — CBC
HCT: 25.1 % — ABNORMAL LOW (ref 36.0–46.0)
Hemoglobin: 8.3 g/dL — ABNORMAL LOW (ref 12.0–15.0)
MCH: 31.1 pg (ref 26.0–34.0)
MCHC: 33.1 g/dL (ref 30.0–36.0)
MCV: 94 fL (ref 80.0–100.0)
Platelets: 296 10*3/uL (ref 150–400)
RBC: 2.67 MIL/uL — ABNORMAL LOW (ref 3.87–5.11)
RDW: 15.8 % — ABNORMAL HIGH (ref 11.5–15.5)
WBC: 7.1 10*3/uL (ref 4.0–10.5)
nRBC: 0 % (ref 0.0–0.2)

## 2023-08-26 LAB — PROTIME-INR
INR: 1.1 (ref 0.8–1.2)
Prothrombin Time: 14.6 s (ref 11.4–15.2)

## 2023-08-26 LAB — COMPREHENSIVE METABOLIC PANEL
ALT: 12 U/L (ref 0–44)
AST: 29 U/L (ref 15–41)
Albumin: 3.4 g/dL — ABNORMAL LOW (ref 3.5–5.0)
Alkaline Phosphatase: 294 U/L — ABNORMAL HIGH (ref 38–126)
Anion gap: 13 (ref 5–15)
BUN: 11 mg/dL (ref 8–23)
CO2: 18 mmol/L — ABNORMAL LOW (ref 22–32)
Calcium: 8.8 mg/dL — ABNORMAL LOW (ref 8.9–10.3)
Chloride: 99 mmol/L (ref 98–111)
Creatinine, Ser: 0.66 mg/dL (ref 0.44–1.00)
GFR, Estimated: >60 mL/min (ref >60)
Glucose, Bld: 156 mg/dL — ABNORMAL HIGH (ref 70–99)
Potassium: 2.8 mmol/L — ABNORMAL LOW (ref 3.5–5.1)
Sodium: 130 mmol/L — ABNORMAL LOW (ref 135–145)
Total Bilirubin: 1 mg/dL (ref 0.0–1.2)
Total Protein: 6.6 g/dL (ref 6.5–8.1)

## 2023-08-26 LAB — ETHANOL: Alcohol, Ethyl (B): <10 mg/dL (ref <10)

## 2023-08-26 LAB — I-STAT CHEM 8, ED
BUN: 12 mg/dL (ref 8–23)
Calcium, Ion: 1.06 mmol/L — ABNORMAL LOW (ref 1.15–1.40)
Chloride: 100 mmol/L (ref 98–111)
Creatinine, Ser: 0.5 mg/dL (ref 0.44–1.00)
Glucose, Bld: 151 mg/dL — ABNORMAL HIGH (ref 70–99)
HCT: 27 % — ABNORMAL LOW (ref 36.0–46.0)
Hemoglobin: 9.2 g/dL — ABNORMAL LOW (ref 12.0–15.0)
Potassium: 2.8 mmol/L — ABNORMAL LOW (ref 3.5–5.1)
Sodium: 130 mmol/L — ABNORMAL LOW (ref 135–145)
TCO2: 19 mmol/L — ABNORMAL LOW (ref 22–32)

## 2023-08-26 LAB — APTT: aPTT: 26 s (ref 24–36)

## 2023-08-26 LAB — CBG MONITORING, ED: Glucose-Capillary: 145 mg/dL — ABNORMAL HIGH (ref 70–99)

## 2023-08-26 MED ORDER — OXYCODONE HCL 5 MG PO TABS
5.0000 mg | ORAL_TABLET | Freq: Four times a day (QID) | ORAL | Status: DC | PRN
  Administered 2023-08-28: 5 mg via ORAL
  Filled 2023-08-26: qty 1

## 2023-08-26 MED ORDER — ACYCLOVIR 400 MG PO TABS
400.0000 mg | ORAL_TABLET | Freq: Two times a day (BID) | ORAL | Status: DC
Start: 2023-08-26 — End: 2023-08-30
  Administered 2023-08-27 – 2023-08-30 (×7): 400 mg via ORAL
  Filled 2023-08-26 (×9): qty 1

## 2023-08-26 MED ORDER — ACETAMINOPHEN 325 MG PO TABS: 650.0000 mg | ORAL_TABLET | ORAL | Status: DC | PRN

## 2023-08-26 MED ORDER — CLEVIDIPINE BUTYRATE 0.5 MG/ML IV EMUL: 0.0000 mg/h | INTRAVENOUS | Status: DC

## 2023-08-26 MED ORDER — STROKE: EARLY STAGES OF RECOVERY BOOK: Freq: Once | Status: DC

## 2023-08-26 MED ORDER — LOSARTAN POTASSIUM 50 MG PO TABS
50.0000 mg | ORAL_TABLET | Freq: Every day | ORAL | Status: DC
  Administered 2023-08-27 – 2023-08-30 (×4): 50 mg via ORAL
  Filled 2023-08-26 (×4): qty 1

## 2023-08-26 MED ORDER — LORAZEPAM 0.5 MG PO TABS
0.5000 mg | ORAL_TABLET | Freq: Every evening | ORAL | Status: DC | PRN
  Administered 2023-08-27: 0.5 mg via ORAL
  Filled 2023-08-26: qty 1

## 2023-08-26 MED ORDER — PREGABALIN 25 MG PO CAPS
25.0000 mg | ORAL_CAPSULE | Freq: Two times a day (BID) | ORAL | Status: DC
  Administered 2023-08-27 – 2023-08-30 (×7): 25 mg via ORAL
  Filled 2023-08-26 (×7): qty 1

## 2023-08-26 MED ORDER — PANTOPRAZOLE SODIUM 40 MG IV SOLR
40.0000 mg | Freq: Every day | INTRAVENOUS | Status: DC
  Administered 2023-08-26 – 2023-08-28 (×3): 40 mg via INTRAVENOUS
  Filled 2023-08-26 (×3): qty 10

## 2023-08-26 MED ORDER — SODIUM CHLORIDE 0.9% FLUSH
3.0000 mL | Freq: Once | INTRAVENOUS | Status: AC
  Administered 2023-08-26: 3 mL via INTRAVENOUS

## 2023-08-26 MED ORDER — ACETAMINOPHEN 650 MG RE SUPP: 650.0000 mg | RECTAL | Status: DC | PRN

## 2023-08-26 MED ORDER — GEMFIBROZIL 600 MG PO TABS
600.0000 mg | ORAL_TABLET | Freq: Two times a day (BID) | ORAL | Status: DC
Start: 2023-08-27 — End: 2023-08-30
  Administered 2023-08-28 – 2023-08-30 (×4): 600 mg via ORAL
  Filled 2023-08-26 (×8): qty 1

## 2023-08-26 MED ORDER — LEVOTHYROXINE SODIUM 50 MCG PO TABS
50.0000 ug | ORAL_TABLET | Freq: Every day | ORAL | Status: DC
  Administered 2023-08-28 – 2023-08-30 (×3): 50 ug via ORAL
  Filled 2023-08-26: qty 2
  Filled 2023-08-26 (×3): qty 1

## 2023-08-26 MED ORDER — AMLODIPINE BESYLATE 5 MG PO TABS
10.0000 mg | ORAL_TABLET | Freq: Every day | ORAL | Status: DC
  Administered 2023-08-27 – 2023-08-30 (×4): 10 mg via ORAL
  Filled 2023-08-26 (×4): qty 2

## 2023-08-26 MED ORDER — LACTATED RINGERS IV SOLN
INTRAVENOUS | Status: AC
Start: 2023-08-26 — End: 2023-08-27

## 2023-08-26 MED ORDER — OXYCODONE HCL 5 MG PO TABS: 5.0000 mg | ORAL_TABLET | Freq: Four times a day (QID) | ORAL | Status: DC | PRN

## 2023-08-26 MED ORDER — SENNOSIDES-DOCUSATE SODIUM 8.6-50 MG PO TABS
1.0000 | ORAL_TABLET | Freq: Two times a day (BID) | ORAL | Status: DC
  Administered 2023-08-26 – 2023-08-30 (×7): 1 via ORAL
  Filled 2023-08-26 (×7): qty 1

## 2023-08-26 MED ORDER — OXYCODONE HCL 5 MG PO TABS: 5.0000 mg | ORAL_TABLET | Freq: Four times a day (QID) | ORAL | 0 refills | Status: DC | PRN

## 2023-08-26 MED ORDER — NITROFURANTOIN MACROCRYSTAL 50 MG PO CAPS
100.0000 mg | ORAL_CAPSULE | Freq: Every day | ORAL | Status: DC
  Administered 2023-08-28 – 2023-08-30 (×3): 100 mg via ORAL
  Filled 2023-08-26 (×2): qty 2
  Filled 2023-08-26: qty 1
  Filled 2023-08-26: qty 2

## 2023-08-26 MED ORDER — POTASSIUM CHLORIDE 10 MEQ/100ML IV SOLN
10.0000 meq | INTRAVENOUS | Status: AC
  Administered 2023-08-26 (×4): 10 meq via INTRAVENOUS
  Filled 2023-08-26 (×3): qty 100

## 2023-08-26 MED ORDER — ACETAMINOPHEN 160 MG/5ML PO SOLN: 650.0000 mg | ORAL | Status: DC | PRN

## 2023-08-26 NOTE — Telephone Encounter (Signed)
 Received call from pt's husband. He states that her pelvic pain remains very painful and that the Hydrocodone  is not helping anymore.  Spoke with Dr. Rosaline Coma. He has sent in a prescription for Oxycodone  5 mg 1-2 tabs every 6 hours to alternate with Ibuprofen 600 mg every 6 hours.  Pt has been taking up to 3 hydrocodone  tabs without substantial relief. She is also on Miralax  to help with constipation from the opioids. Mr. Phann states this is working well. Advised husband to call back regarding her pain relief with the oxycodone . He states he will call.

## 2023-08-26 NOTE — Progress Notes (Signed)
 10 mg labetelol given for SBP 160s.

## 2023-08-26 NOTE — Progress Notes (Signed)
 Patient ID: [name] Page, female   DOB: 08-07-1931, 89 y.o.   MRN: [id number] I have reviewed the CT cervical and head. I removed the cervical collar, she is in good alignment.  Mr. Awada her husband has stated he does not wish to pursue operative treatment under circumstance. I concur. This is an acute subdural hematoma and the morbidity and mortality are quite high, and highly unfavorable for a 89 yo woman.  She does move all extremities at this time, and follows commands.

## 2023-08-26 NOTE — Assessment & Plan Note (Addendum)
Cont home BP meds once med rec completed ?

## 2023-08-26 NOTE — Assessment & Plan Note (Addendum)
 Traumatic SDH following fall. Non-op management, see NS consult. ICH pathway Not on any ACs Start PT/OT when okay to do so by NS SW consult: [facility], presumably needs to go to their SNF at her facility. SLP eval Advancing diet as tolerated.

## 2023-08-26 NOTE — H&P (Addendum)
 History and Physical    Patient: [name] Page:[id number] DOB: 1931-07-08 DOA: 08/26/2023 DOS: the patient was seen and examined on 08/26/2023 PCP: Suan Elm, MD  Patient coming from: Home  Chief Complaint:  Chief Complaint  Patient presents with   Code Stroke   HPI: MELADEE COPPINS is a 89 y.o. female with medical history significant of MM, prior DVT but not on chronic AC, recurrent falls in recent months.  Pt in to ED as code stroke with R sided weakness and slurred speech.  Found on ground 1630, LKW 1600.  Pt not on AC.  Most recent prior fall in Dec.   Review of Systems: As mentioned in the history of present illness. All other systems reviewed and are negative. Past Medical History:  Diagnosis Date   Alopecia 10/28/2016   Anginal pain (HCC)    Anxiety    Arthritis    Cancer (HCC)    Cervical spondylosis    C3-4 AND C4-5   Chest pain 07/28/2008   H/O, normal stress nuclear EF 78%   Chronic cough 07/12/2014   Collapsed lung 07/2010   being treated at [facility] - in left lung   Complication of anesthesia    Cough 07/30/2010   Qualifier: Diagnosis of  By: Bertrum Brodie MD, Murali     Cystocele 05/23/2015   Cystocele, midline 05/02/2013   DEEP VEIN THROMBOSIS/PHLEBITIS 07/30/2010   Qualifier: History of  By: Jens Molder CMA, Jennifer     Dislocation closed, shoulder 02/2013   DVT (deep venous thrombosis) (HCC) 2006   Encounter for therapeutic drug monitoring 10/03/2014   GERD (gastroesophageal reflux disease)    occ   Headache    hx   High cholesterol    History of recurrent UTIs 09/24/2011   HYPERLIPIDEMIA 07/30/2010   Qualifier: Diagnosis of  By: Jens Molder CMA, Jennifer     Hypertension    HYPERTENSION 07/30/2010   Qualifier: Diagnosis of  By: Jens Molder CMA, Jennifer     Hypothyroidism    Nasal itching 01/30/2014   Osteopenia 05/23/2015   Peripheral vascular disease (HCC) 2006   dvt's and 50 yrs ago   Pleuritic pain 12/17/2015   PONV (postoperative  nausea and vomiting)    Primary localized osteoarthritis of right hip 08/26/2016   Primary osteoarthritis of right hip 08/26/2016   Pulmonary collapse 07/30/2010   Qualifier: Diagnosis of  By: Bertrum Brodie MD, Murali     PULMONARY NODULE 07/30/2010   Qualifier: Diagnosis of  By: Bertrum Brodie MD, Murali     Rash and nonspecific skin eruption 01/02/2015   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   Sarcoidosis    Sarcoidosis of lung (HCC)    Vaginal atrophy 09/24/2011   Visual field defect nasal step 01/02/2015   Past Surgical History:  Procedure Laterality Date   BACK SURGERY  2022   EYE SURGERY     FOOT SURGERY Left 06/2005   bunion hammer toe   HEMORRHOID SURGERY  12/1972   INTRAOCULAR LENS INSERTION     right eye 10 /16 and left 06/16/2005   JOINT REPLACEMENT     KYPHOPLASTY N/A 06/27/2021   Procedure: LUMBAR 3 AND LUMBAR  5 KYPHOPLASTY;  Surgeon: Virl Grimes, MD;  Location: [facility] OR;  Service: Orthopedics;  Laterality: N/A;   KYPHOPLASTY N/A 09/04/2021   Procedure: THORACIC SEVEN, THORACIC EIGHT, THORACIC NINE, THORACIC TEN KYPHOPLASTY;  Surgeon: Virl Grimes, MD;  Location: [facility] OR;  Service: Orthopedics;  Laterality: N/A;   REVERSE SHOULDER ARTHROPLASTY Left 12/05/2022  Procedure: REVERSE SHOULDER ARTHROPLASTY;  Surgeon: Winston Hawking, MD;  Location: [facility];  Service: Orthopedics;  Laterality: Left;  CHOICE WITH INTERSCALENE BLOCK   SKIN BIOPSY  01/2007   basal cell carcinoma removed from right lower leg    TOTAL HIP ARTHROPLASTY  06/2007   left   TOTAL HIP ARTHROPLASTY Right 08/26/2016   Procedure: TOTAL HIP ARTHROPLASTY ANTERIOR APPROACH;  Surgeon: Dayne Even, MD;  Location: [facility] OR;  Service: Orthopedics;  Laterality: Right;   TOTAL KNEE ARTHROPLASTY Right 10/2007   right   Social History:  reports that she has never smoked. She has never used smokeless tobacco. She reports that she does not currently use alcohol . She reports that she does not use drugs.  No Known  Allergies  Family History  Problem Relation Age of Onset   Diabetes Mother    Heart failure Mother    Hypertension Father    Diabetes Father    Heart disease Father    Heart attack Father     Prior to Admission medications   Medication Sig Start Date End Date Taking? Authorizing Provider  acyclovir  (ZOVIRAX ) 400 MG tablet TAKE ONE (1) TABLET BY MOUTH TWO (2) TIMES DAILY 07/27/23   Dorsey, John T IV, MD  amLODipine  (NORVASC ) 10 MG tablet Take 10 mg by mouth daily.    [provider]  b complex vitamins capsule Take 1 capsule by mouth daily.    [provider]  furosemide  (LASIX ) 20 MG tablet Take 20 mg by mouth daily as needed for edema.    [provider]  gemfibrozil  (LOPID ) 600 MG tablet Take 600 mg by mouth 2 (two) times daily before a meal.    [provider]  HYDROcodone -acetaminophen  (NORCO/VICODIN) 5-325 MG tablet TAKE 1 TABLET BY MOUTH EVERY 6 HOURS AS NEEDED FOR MODERATE PAIN 08/14/23   Dorsey, John T IV, MD  HYDROcodone -acetaminophen  (NORCO/VICODIN) 5-325 MG tablet Take 1 tablet by mouth every 6 (six) hours as needed for moderate pain (pain score 4-6). 08/14/23   Ander Bame, MD  levothyroxine  (SYNTHROID ) 50 MCG tablet Take 50 mcg by mouth daily. 12/22/22   [provider]  LORazepam  (ATIVAN ) 1 MG tablet Take 0.5 mg by mouth at bedtime as needed for anxiety.    [provider]  losartan  (COZAAR ) 50 MG tablet Take 50 mg by mouth daily.    [provider]  Multiple Vitamin (MULTIVITAMIN) capsule Take 1 capsule by mouth daily.    [provider]  naproxen  sodium (ALEVE ) 220 MG tablet Take 440 mg by mouth daily as needed (pain).    [provider]  nitrofurantoin  (MACRODANTIN ) 100 MG capsule daily.    [provider]  oxyCODONE  (OXY IR/ROXICODONE ) 5 MG immediate release tablet Take 1-2 tablets (5-10 mg total) by mouth every 6 (six) hours as needed for severe pain (pain score 7-10). 08/26/23    Ander Bame, MD  pantoprazole  (PROTONIX ) 20 MG tablet Take 40 mg by mouth daily at 12 noon.    [provider]  pregabalin  (LYRICA ) 25 MG capsule TAKE 1 CAPSULE BY MOUTH TWICE DAILY 06/15/23   Burton, Lacie K, NP    Physical Exam: Vitals:   08/26/23 1700 08/26/23 1800 08/26/23 1815 08/26/23 1915  BP:  (!) 142/56 (!) 136/91 (!) 138/59  Pulse:   71 68  Resp:   15 19  SpO2:   96% 99%  Weight: 50 kg      Constitutional: NAD, calm, comfortable Respiratory:  clear to auscultation bilaterally, no wheezing, no crackles. Normal respiratory effort. No accessory muscle use.  Cardiovascular: Regular rate and rhythm, no murmurs / rubs / gallops. No extremity edema. 2+ pedal pulses. No carotid bruits.  Abdomen: no tenderness, no masses palpated. No hepatosplenomegaly. Bowel sounds positive.  Skin: Bruises at multiple locations, R shoulder.  Offered to X ray shoulder but denies pain. Neurologic: RUE and RLE weakness present.  R pupil > L Psychiatric: Normal judgment and insight.  Not having AMS really at time of my exam.  Data Reviewed:    Labs on Admission: I have personally reviewed following labs and imaging studies  CBC: Recent Labs  Lab 08/26/23 1743 08/26/23 1752  WBC 7.1  --   NEUTROABS 5.3  --   HGB 8.3* 9.2*  HCT 25.1* 27.0*  MCV 94.0  --   PLT 296  --    Basic Metabolic Panel: Recent Labs  Lab 08/26/23 1743 08/26/23 1752  NA 130* 130*  K 2.8* 2.8*  CL 99 100  CO2 18*  --   GLUCOSE 156* 151*  BUN 11 12  CREATININE 0.66 0.50  CALCIUM 8.8*  --    GFR: Estimated Creatinine Clearance: 31.9 mL/min (by C-G formula based on SCr of 0.5 mg/dL). Liver Function Tests: Recent Labs  Lab 08/26/23 1743  AST 29  ALT 12  ALKPHOS 294*  BILITOT 1.0  PROT 6.6  ALBUMIN 3.4*   No results for input(s): "LIPASE", "AMYLASE" in the last 168 hours. No results for input(s): "AMMONIA" in the last 168 hours. Coagulation Profile: Recent Labs  Lab 08/26/23 1743  INR 1.1    Cardiac Enzymes: No results for input(s): "CKTOTAL", "CKMB", "CKMBINDEX", "TROPONINI" in the last 168 hours. BNP (last 3 results) No results for input(s): "PROBNP" in the last 8760 hours. HbA1C: No results for input(s): "HGBA1C" in the last 72 hours. CBG: Recent Labs  Lab 08/26/23 1746  GLUCAP 145*   Lipid Profile: No results for input(s): "CHOL", "HDL", "LDLCALC", "TRIG", "CHOLHDL", "LDLDIRECT" in the last 72 hours. Thyroid  Function Tests: No results for input(s): "TSH", "T4TOTAL", "FREET4", "T3FREE", "THYROIDAB" in the last 72 hours. Anemia Panel: No results for input(s): "VITAMINB12", "FOLATE", "FERRITIN", "TIBC", "IRON", "RETICCTPCT" in the last 72 hours. Urine analysis:    Component Value Date/Time   COLORURINE YELLOW 05/25/2023 1038   APPEARANCEUR CLEAR 05/25/2023 1038   LABSPEC 1.010 05/25/2023 1038   PHURINE 6.0 05/25/2023 1038   GLUCOSEU NEGATIVE 05/25/2023 1038   HGBUR NEGATIVE 05/25/2023 1038   BILIRUBINUR NEGATIVE 05/25/2023 1038   KETONESUR NEGATIVE 05/25/2023 1038   PROTEINUR NEGATIVE 05/25/2023 1038   UROBILINOGEN 0.2 07/19/2010 1500   NITRITE NEGATIVE 05/25/2023 1038   LEUKOCYTESUR NEGATIVE 05/25/2023 1038    Radiological Exams on Admission: DG Pelvis Portable Result Date: 08/26/2023 CLINICAL DATA:  fall EXAM: PORTABLE PELVIS 1-2 VIEWS COMPARISON:  CT right hip 07/31/2023 FINDINGS: Limited evaluation due to overlapping osseous structures and overlying soft tissues. Total bilateral hip arthroplasty. Kyphoplasty of the visualized lower lumbar spine. Redemonstration of cortical irregularity along the right superior pubic rami that correlates with question of a than acute now subacute fracture noted on CT right hip 07/31/2023. Question irregularity of the inferior right superior pubic ram no pelvic diastasis. No pelvic bone lesions are seen. Stool within the rectum. IMPRESSION: 1. Recommend CT. 2. Redemonstration of cortical irregularity along the right superior  pubic rami that correlates with question of a than acute now subacute fracture noted on CT right hip 07/31/2023. Superimposed acute component  not excluded. 3. Question irregularity of the inferior right superior pubic rami- limited evaluation due to overlapping osseous structures and overlying soft tissues. 4. Total bilateral hip arthroplasty. 5. Kyphoplasty of the visualized lower lumbar spine. Electronically Signed   By: Morgane  Naveau M.D.   On: 08/26/2023 19:38   CT Cervical Spine Wo Contrast Result Date: 08/26/2023 CLINICAL DATA:  Neck trauma (Age >= 65y) EXAM: CT CERVICAL SPINE WITHOUT CONTRAST TECHNIQUE: Multidetector CT imaging of the cervical spine was performed without intravenous contrast. Multiplanar CT image reconstructions were also generated. RADIATION DOSE REDUCTION: This exam was performed according to the departmental dose-optimization program which includes automated exposure control, adjustment of the mA and/or kV according to patient size and/or use of iterative reconstruction technique. COMPARISON:  CT cervical spine 07/31/2023 FINDINGS: Alignment: Grade 1 anterolisthesis of C2 on C3. Mild retrolisthesis C3 on C4 mild retrolisthesis of C5 on C6. Skull base and vertebrae: Multilevel severe degenerative change of the spine with associated severe osseous neural foraminal stenosis at the bilateral C3-C4 and right C4-C5 levels. No acute fracture. No aggressive appearing focal osseous lesion or focal pathologic process. Soft tissues and spinal canal: No prevertebral fluid or swelling. No visible canal hematoma. Upper chest: Unremarkable. Other: Atherosclerotic calcifications are present within the cavernous internal carotid and vertebral arteries. IMPRESSION: 1. No acute displaced fracture or traumatic listhesis of the cervical spine. 2. Multilevel severe degenerative change of the spine with associated severe osseous neural foraminal stenosis at the bilateral C3-C4 and right C4-C5 levels.  Electronically Signed   By: Morgane  Naveau M.D.   On: 08/26/2023 19:34   DG Chest Portable 1 View Result Date: 08/26/2023 CLINICAL DATA:  Fall. EXAM: PORTABLE CHEST 1 VIEW COMPARISON:  July 23, 2021. FINDINGS: Stable cardiomediastinal silhouette. Status post left shoulder arthroplasty. Hypoinflation of the lungs with minimal bibasilar subsegmental atelectasis. Status post kyphoplasty at multiple levels of the thoracic spine. IMPRESSION: Hypoinflation of the lungs with minimal bibasilar subsegmental atelectasis. Electronically Signed   By: Rosalene Colon M.D.   On: 08/26/2023 19:15   CT HEAD CODE STROKE WO CONTRAST Result Date: 08/26/2023 CLINICAL DATA:  Code stroke.  Aphasia, right-sided deficit EXAM: CT HEAD WITHOUT CONTRAST TECHNIQUE: Contiguous axial images were obtained from the base of the skull through the vertex without intravenous contrast. RADIATION DOSE REDUCTION: This exam was performed according to the departmental dose-optimization program which includes automated exposure control, adjustment of the mA and/or kV according to patient size and/or use of iterative reconstruction technique. COMPARISON:  07/31/2023 FINDINGS: Brain: Large left cerebral convexity subdural hematoma, which is mixed density, favored to be acute and, which measures up to 2.8 cm (series 6, image 15), with significant mass effect on the underlying left cerebral hemisphere, particularly the left parietal lobe. 5 mm of left-to-right midline shift. Additional subarachnoid hemorrhage in the right temporal sulci (series 6, image 21). No evidence of acute infarct, mass, hydrocephalus, or intraventricular hemorrhage. Vascular: No hyperdense vessel. Skull: Negative for fracture or focal lesion. Sinuses/Orbits: No acute finding. Other: The mastoid air cells are well aerated. IMPRESSION: 1. Large left cerebral convexity subdural hematoma, which is mixed density, favored to be acute, which measures up to 2.8 cm, with mass effect on  the underlying left cerebral hemisphere, particularly the left parietal lobe, and 5 mm of left-to-right midline shift. 2. Additional subarachnoid hemorrhage in the right temporal sulci. Imaging results were communicated on 08/26/2023 at 6:00 pm to provider Dr. Alecia Ames via secure text paging. Electronically Signed   By: Richad Champagne  Vasan M.D.   On: 08/26/2023 18:02    EKG: Independently reviewed.   Assessment and Plan: * SDH (subdural hematoma) (HCC) Traumatic SDH following fall. Non-op management, see NS consult. ICH pathway Not on any ACs Start PT/OT when okay to do so by NS SW [facility], [facility] needs to go to their [facility]F at her facility. SLP eval Advancing diet as tolerated.  Multiple myeloma (HCC) Cont home Oxy PRN. Has anemia but this looks chronic and stable Normal platelets.  Essential hypertension Cont home BP meds once med rec completed.      Advance Care Planning:   Code Status: Limited: Do not attempt resuscitation (DNR) -DNR-LIMITED -Do Not Intubate/DNI   Consults: Dr. Michale Age  Family Communication: Husband at bedside  Severity of Illness: The appropriate patient status for this patient is INPATIENT. Inpatient status is judged to be reasonable and necessary in order to provide the required intensity of service to ensure the patient's safety. The patient's presenting symptoms, physical exam findings, and initial radiographic and laboratory data in the context of their chronic comorbidities is felt to place them at high risk for further clinical deterioration. Furthermore, it is not anticipated that the patient will be medically stable for discharge from the hospital within 2 midnights of admission.   * I certify that at the point of admission it is my clinical judgment that the patient will require inpatient hospital care spanning beyond 2 midnights from the point of admission due to high intensity of service, high risk for further  deterioration and high frequency of surveillance required.*  Author: Taggert Bozzi M., DO 08/26/2023 8:23 PM  For on call review [email or web address].

## 2023-08-26 NOTE — ED Provider Notes (Signed)
 Emergency Department Provider Note   I have reviewed the triage vital signs and the nursing notes.   HISTORY  Chief Complaint Code Stroke   HPI [name] Page is a 89 y.o. female with past history reviewed below presents to the emergency department for evaluation of fall.  Last seen normal at 1600 when her husband stepped out to go to the store.  He returned at 1630 and found the patient on the ground.  They found her on the ground with slurred speech and some right-sided weakness and a code stroke was activated in the prehospital setting.  Patient does not anticoagulated.  She has a history of frequent falls including most recently in December.   Level 5 caveat: AMS   Past Medical History:  Diagnosis Date   Alopecia 10/28/2016   Anginal pain (HCC)    Anxiety    Arthritis    Cancer (HCC)    Cervical spondylosis    C3-4 AND C4-5   Chest pain 07/28/2008   H/O, normal stress nuclear EF 78%   Chronic cough 07/12/2014   Collapsed lung 07/2010   being treated at [facility] - in left lung   Complication of anesthesia    Cough 07/30/2010   Qualifier: Diagnosis of  By: Bertrum Brodie MD, Murali     Cystocele 05/23/2015   Cystocele, midline 05/02/2013   DEEP VEIN THROMBOSIS/PHLEBITIS 07/30/2010   Qualifier: History of  By: Jens Molder CMA, Jennifer     Dislocation closed, shoulder 02/2013   DVT (deep venous thrombosis) (HCC) 2006   Encounter for therapeutic drug monitoring 10/03/2014   GERD (gastroesophageal reflux disease)    occ   Headache    hx   High cholesterol    History of recurrent UTIs 09/24/2011   HYPERLIPIDEMIA 07/30/2010   Qualifier: Diagnosis of  By: Jens Molder CMA, Jennifer     Hypertension    HYPERTENSION 07/30/2010   Qualifier: Diagnosis of  By: Jens Molder CMA, Jennifer     Hypothyroidism    Nasal itching 01/30/2014   Osteopenia 05/23/2015   Peripheral vascular disease (HCC) 2006   dvt's and 50 yrs ago   Pleuritic pain 12/17/2015   PONV (postoperative nausea and  vomiting)    Primary localized osteoarthritis of right hip 08/26/2016   Primary osteoarthritis of right hip 08/26/2016   Pulmonary collapse 07/30/2010   Qualifier: Diagnosis of  By: Bertrum Brodie MD, Murali     PULMONARY NODULE 07/30/2010   Qualifier: Diagnosis of  By: Bertrum Brodie MD, Murali     Rash and nonspecific skin eruption 01/02/2015   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   ____________________________________________   PHYSICAL EXAM:  VITAL SIGNS: Vitals:   08/26/23 1815 08/26/23 1915  BP: (!) 136/91 (!) 138/59  Pulse: 71 68  Resp: 15 19  SpO2: 96% 99%   Constitutional: Alert with some confusion noted. Well appearing and in no acute distress. Eyes: Conjunctivae are normal. PERRL with slightly larger pupil on the right.  Head: Atraumatic. Nose: No congestion/rhinnorhea. Mouth/Throat: Mucous membranes are moist. Neck: No stridor.   Cardiovascular: Normal rate, regular rhythm. Good peripheral circulation. Grossly normal heart sounds.   Respiratory: Normal respiratory effort.  No retractions. Lungs CTAB. Gastrointestinal: Soft and nontender. No distention.  Musculoskeletal: No gross deformities of extremities. Normal ROM of the bilateral upper and  lower extremities.  Neurologic: Slurred, hesitant speech at times. Slight right arm weakness compared to left (4+/5).  Skin:  Skin is warm, dry.  Skin tear to the right forearm and right lower extremity.  No deeper lacerations.  ____________________________________________   LABS (all labs ordered are listed, but only abnormal results are displayed)  Labs Reviewed  CBC - Abnormal; Notable for the following components:      Result Value   RBC 2.67 (*)    Hemoglobin 8.3 (*)    HCT 25.1 (*)    RDW 15.8 (*)    All other components within normal limits   COMPREHENSIVE METABOLIC PANEL - Abnormal; Notable for the following components:   Sodium 130 (*)    Potassium 2.8 (*)    CO2 18 (*)    Glucose, Bld 156 (*)    Calcium 8.8 (*)    Albumin 3.4 (*)    Alkaline Phosphatase 294 (*)    All other components within normal limits  I-STAT CHEM 8, ED - Abnormal; Notable for the following components:   Sodium 130 (*)    Potassium 2.8 (*)    Glucose, Bld 151 (*)    Calcium, Ion 1.06 (*)    TCO2 19 (*)    Hemoglobin 9.2 (*)    HCT 27.0 (*)    All other components within normal limits  CBG MONITORING, ED - Abnormal; Notable for the following components:   Glucose-Capillary 145 (*)    All other components within normal limits  PROTIME-INR  APTT  DIFFERENTIAL  ETHANOL   ____________________________________________  EKG   EKG Interpretation Date/Time:  Wednesday August 26 2023 18:05:02 EST Ventricular Rate:  63 PR Interval:  181 QRS Duration:  160 QT Interval:  497 QTC Calculation: 509 R Axis:   91  Text Interpretation: Sinus rhythm Atrial premature complex Right bundle branch block Similar to prior. Confirmed by Abby Hocking [phone]) on 08/26/2023 9:07:30 PM        ____________________________________________  RADIOLOGY  DG Pelvis Portable Result Date: 08/26/2023 CLINICAL DATA:  fall EXAM: PORTABLE PELVIS 1-2 VIEWS COMPARISON:  CT right hip 07/31/2023 FINDINGS: Limited evaluation due to overlapping osseous structures and overlying soft tissues. Total bilateral hip arthroplasty. Kyphoplasty of the visualized lower lumbar spine. Redemonstration of cortical irregularity along the right superior pubic rami that correlates with question of a than acute now subacute fracture noted on CT right hip 07/31/2023. Question irregularity of the inferior right superior pubic ram no pelvic diastasis. No pelvic bone lesions are seen. Stool within the rectum. IMPRESSION: 1. Recommend CT. 2. Redemonstration of cortical irregularity along the right  superior pubic rami that correlates with question of a than acute now subacute fracture noted on CT right hip 07/31/2023. Superimposed acute component not excluded. 3. Question irregularity of the inferior right superior pubic rami- limited evaluation due to overlapping osseous structures and overlying soft tissues. 4. Total bilateral hip arthroplasty. 5. Kyphoplasty of the visualized lower lumbar spine. Electronically Signed   By: Morgane  Naveau M.D.   On: 08/26/2023 19:38   CT Cervical Spine Wo Contrast Result Date: 08/26/2023 CLINICAL DATA:  Neck trauma (Age >= 65y) EXAM: CT CERVICAL SPINE WITHOUT CONTRAST TECHNIQUE: Multidetector CT imaging of the cervical spine was performed without intravenous contrast. Multiplanar CT image reconstructions were also generated. RADIATION DOSE REDUCTION: This exam was performed according to the departmental dose-optimization program which includes automated exposure control, adjustment of the mA and/or kV according to patient size and/or use of iterative reconstruction technique.  COMPARISON:  CT cervical spine 07/31/2023 FINDINGS: Alignment: Grade 1 anterolisthesis of C2 on C3. Mild retrolisthesis C3 on C4 mild retrolisthesis of C5 on C6. Skull base and vertebrae: Multilevel severe degenerative change of the spine with associated severe osseous neural foraminal stenosis at the bilateral C3-C4 and right C4-C5 levels. No acute fracture. No aggressive appearing focal osseous lesion or focal pathologic process. Soft tissues and spinal canal: No prevertebral fluid or swelling. No visible canal hematoma. Upper chest: Unremarkable. Other: Atherosclerotic calcifications are present within the cavernous internal carotid and vertebral arteries. IMPRESSION: 1. No acute displaced fracture or traumatic listhesis of the cervical spine. 2. Multilevel severe degenerative change of the spine with associated severe osseous neural foraminal stenosis at the bilateral C3-C4 and right C4-C5  levels. Electronically Signed   By: Morgane  Naveau M.D.   On: 08/26/2023 19:34   DG Chest Portable 1 View Result Date: 08/26/2023 CLINICAL DATA:  Fall. EXAM: PORTABLE CHEST 1 VIEW COMPARISON:  July 23, 2021. FINDINGS: Stable cardiomediastinal silhouette. Status post left shoulder arthroplasty. Hypoinflation of the lungs with minimal bibasilar subsegmental atelectasis. Status post kyphoplasty at multiple levels of the thoracic spine. IMPRESSION: Hypoinflation of the lungs with minimal bibasilar subsegmental atelectasis. Electronically Signed   By: Rosalene Colon M.D.   On: 08/26/2023 19:15   CT HEAD CODE STROKE WO CONTRAST Result Date: 08/26/2023 CLINICAL DATA:  Code stroke.  Aphasia, right-sided deficit EXAM: CT HEAD WITHOUT CONTRAST TECHNIQUE: Contiguous axial images were obtained from the base of the skull through the vertex without intravenous contrast. RADIATION DOSE REDUCTION: This exam was performed according to the departmental dose-optimization program which includes automated exposure control, adjustment of the mA and/or kV according to patient size and/or use of iterative reconstruction technique. COMPARISON:  07/31/2023 FINDINGS: Brain: Large left cerebral convexity subdural hematoma, which is mixed density, favored to be acute and, which measures up to 2.8 cm (series 6, image 15), with significant mass effect on the underlying left cerebral hemisphere, particularly the left parietal lobe. 5 mm of left-to-right midline shift. Additional subarachnoid hemorrhage in the right temporal sulci (series 6, image 21). No evidence of acute infarct, mass, hydrocephalus, or intraventricular hemorrhage. Vascular: No hyperdense vessel. Skull: Negative for fracture or focal lesion. Sinuses/Orbits: No acute finding. Other: The mastoid air cells are well aerated. IMPRESSION: 1. Large left cerebral convexity subdural hematoma, which is mixed density, favored to be acute, which measures up to 2.8 cm, with mass  effect on the underlying left cerebral hemisphere, particularly the left parietal lobe, and 5 mm of left-to-right midline shift. 2. Additional subarachnoid hemorrhage in the right temporal sulci. Imaging results were communicated on 08/26/2023 at 6:00 pm to provider Dr. Alecia Ames via secure text paging. Electronically Signed   By: Zoila Hines M.D.   On: 08/26/2023 18:02    ____________________________________________   PROCEDURES  Procedure(s) performed:   Procedures  CRITICAL CARE Performed by: Roberts Ching Total critical care time: 35 minutes Critical care time was exclusive of separately billable procedures and treating other patients. Critical care was necessary to treat or prevent imminent or life-threatening deterioration. Critical care was time spent personally by me on the following activities: development of treatment plan with patient and/or surrogate as well as nursing, discussions with consultants, evaluation of patient's response to treatment, examination of patient, obtaining history from patient or surrogate, ordering and performing treatments and interventions, ordering and review of laboratory studies, ordering and review of radiographic studies, pulse oximetry and re-evaluation of patient's condition.  Abby Hocking, MD Emergency Medicine  ____________________________________________   INITIAL IMPRESSION / ASSESSMENT AND PLAN / ED COURSE  Pertinent labs & imaging results that were available during my care of the patient were reviewed by me and considered in my medical decision making (see chart for details).   This patient is Presenting for Evaluation of head injury, which does require a range of treatment options, and is a complaint that involves a high risk of morbidity and mortality.  The Differential Diagnoses includes subdural hematoma, epidural hematoma, acute concussion, traumatic subarachnoid hemorrhage, cerebral contusions, etc.   Critical Interventions-     Medications   stroke: early stages of recovery book (has no administration in time range)  acetaminophen  (TYLENOL ) tablet 650 mg (has no administration in time range)    Or  acetaminophen  (TYLENOL ) 160 MG/5ML solution 650 mg (has no administration in time range)    Or  acetaminophen  (TYLENOL ) suppository 650 mg (has no administration in time range)  senna-docusate (Senokot-S) tablet 1 tablet (has no administration in time range)  pantoprazole  (PROTONIX ) injection 40 mg (has no administration in time range)  potassium chloride  10 mEq in 100 mL IVPB (10 mEq Intravenous New Bag/Given 08/26/23 2049)  lactated ringers  infusion ( Intravenous New Bag/Given 08/26/23 2052)  oxyCODONE  (Oxy IR/ROXICODONE ) immediate release tablet 5 mg (has no administration in time range)  sodium chloride  flush (NS) 0.9 % injection 3 mL (3 mLs Intravenous Given 08/26/23 2016)    Reassessment after intervention: pain well controlled.    I did obtain Additional Historical Information from husband at bedside, as the patient is confused.  I decided to review pertinent External Data, and in summary patient with prior ED visit and fall in late December.   Clinical Laboratory Tests Ordered, included mild hyponatremia at 130.  Hypokalemia to 2.8.  No acute kidney injury.  EtOH negative.  Mild anemia to 8.3 similar to prior values.  Radiologic Tests Ordered, included CT head, c spine, CXR, and pelvis XR. I independently interpreted the images and agree with radiology interpretation.   Cardiac Monitor Tracing which shows NSR.    Social Determinants of Health Risk patient is a non-smoker.   Consult complete with NSG, Dr. Cabbell. Patient is DNR/DNI and would not want surgical intervention per husband.   TRH. Plan for admit.   Medical Decision Making: Summary:  Patient presents to the emergency department for evaluation of fall at home with head injury.  Arrives as a code stroke but ultimately found to have a fairly  large subdural hemorrhage.  Some minimal midline shift.  Patient is awake and alert.  Husband and route to discuss goals of care.  Reevaluation with update and discussion with patient and husband at bedside.  They have met with neurosurgery and advised that the patient is DNR/DNI.  They wish to continue with monitoring in the hospital.  Patient has not required additional medicines to keep her systolic blood pressure less than 160.   Patient's presentation is most consistent with acute presentation with potential threat to life or bodily function.   Disposition: admit  ____________________________________________  FINAL CLINICAL IMPRESSION(S) / ED DIAGNOSES  Final diagnoses:  SDH (subdural hematoma) (HCC)  Fall, initial encounter    Note:  This document was prepared using Conservation officer, historic buildings and may include unintentional dictation errors.  Abby Hocking, MD, [facility] Emergency Medicine    Millan Legan, Shereen Dike, MD 08/26/23 2110

## 2023-08-26 NOTE — Code Documentation (Signed)
 [name] Page is a 89 yr old female with a PMH of cancer, DVT, GERD HTN arriving to [facility] via [facility] on 08/26/2023. Pt is coming from home where she was last known well today at 1600. Sometime after this, she had an unwitnessed fall and husband found her down, having difficulty speaking. Code Stroke activated by EMS for Aphasia and rt sided weakness. She is on no anticoagulant.    Pt met at bridge by stroke team. Labs, CBG obtained. Airway cleared by EDP. Pt to CT with team. NIHSS 8. Pt with aphasia and right sided weakness on exam. (Please see documentation for NIHSS details and timeline). The following imaging was obtained: CT. Per Dr. Alecia Ames, CT shows Left sided subdural hematoma.     Pt returned to ED room 7 where her workup will continue. Labetelol given to lower BP to 130-150 goal. Code stroke cancelled at 1815. EDP to consult Neurosurgery. Bedside handoff with ED RN complete.

## 2023-08-26 NOTE — Consult Note (Addendum)
 NEUROLOGY CONSULT NOTE   Date of service: August 26, 2023 Patient Name: [name] Page MRN:  [id number] DOB:  May 06, 1931 Chief Complaint: Code Stroke History is obtained from: patient and EMS personnel  History of Present Illness  [name] Page is a 89 y.o. female  has a past medical history of Alopecia (10/28/2016), Anginal pain (HCC), Anxiety, Arthritis, Cancer (HCC), Cervical spondylosis, Chest pain (07/28/2008), Chronic cough (07/12/2014), Collapsed lung (07/2010), Complication of anesthesia, Cough (07/30/2010), Cystocele (05/23/2015), Cystocele, midline (05/02/2013), DEEP VEIN THROMBOSIS/PHLEBITIS (07/30/2010), Dislocation closed, shoulder (02/2013), DVT (deep venous thrombosis) (HCC) (2006), Encounter for therapeutic drug monitoring (10/03/2014), GERD (gastroesophageal reflux disease), Headache, High cholesterol, History of recurrent UTIs (09/24/2011), HYPERLIPIDEMIA (07/30/2010), Hypertension, HYPERTENSION (07/30/2010), Hypothyroidism, Nasal itching (01/30/2014), Osteopenia (05/23/2015), Peripheral vascular disease (HCC) (2006), Pleuritic pain (12/17/2015), PONV (postoperative nausea and vomiting), Primary localized osteoarthritis of right hip (08/26/2016), Primary osteoarthritis of right hip (08/26/2016), Pulmonary collapse (07/30/2010), PULMONARY NODULE (07/30/2010), Rash and nonspecific skin eruption (01/02/2015), 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 who presents with an unwitnessed fall with unknown LOC and aphasia and right side weakness. Patient was LKW @ 1600. Husband came home and found her on the floor, had hit her head and called EMS. Ems arrived and noted the patient to have aphasia and right side weakness. Code stroke was activated by EMS. BP was 180/90 with EMS, with A fib on the monitor. NIHSS 7. CT head with large acute left SDH with 5mm left to right shift.  Not on any AC. BP in Ct [address]'s, HR 83- given 10mg  IV labetalol. Code stroke was cancelled  EDP consulted neurosurgery   LKW: 1600 Modified rankin score: 0-Completely asymptomatic and back to baseline post- stroke IV Thrombolysis: No, SDH EVT: No, no LVO   1a Level of Conscious.: 0 1b LOC Questions: 1 1c LOC Commands: 0 2 Best Gaze: 0 3 Visual: 0 4 Facial Palsy: 1 5a Motor Arm - left: 0 5b Motor Arm - Right: 1 6a Motor Leg - Left: 0 6b Motor Leg - Right: 2 7 Limb Ataxia: 0 8 Sensory: 0 9 Best Language: 1 10 Dysarthria: 1 11 Extinct. and Inatten.: 0 TOTAL: 7    ROS  Comprehensive ROS Unable to ascertain due to confusion   Past History   Past Medical History:  Diagnosis Date   Alopecia 10/28/2016   Anginal pain (HCC)    Anxiety    Arthritis    Cancer (HCC)    Cervical spondylosis    C3-4 AND C4-5   Chest pain 07/28/2008   H/O, normal stress nuclear EF 78%   Chronic cough 07/12/2014   Collapsed lung 07/2010   being treated at [facility] - in left lung   Complication of anesthesia    Cough 07/30/2010   Qualifier: Diagnosis of  By: Bertrum Brodie MD, Murali     Cystocele 05/23/2015   Cystocele, midline 05/02/2013   DEEP VEIN THROMBOSIS/PHLEBITIS 07/30/2010   Qualifier: History of  By: Jens Molder CMA, Jennifer     Dislocation closed, shoulder 02/2013   DVT (deep venous thrombosis) (HCC) 2006   Encounter for therapeutic drug monitoring 10/03/2014   GERD (gastroesophageal reflux disease)    occ   Headache    hx   High cholesterol    History of recurrent UTIs 09/24/2011   HYPERLIPIDEMIA 07/30/2010   Qualifier: Diagnosis of  By: Jens Molder CMA, Jennifer     Hypertension    HYPERTENSION 07/30/2010  Qualifier: Diagnosis of  By: Jens Molder CMA, Jennifer     Hypothyroidism    Nasal itching 01/30/2014   Osteopenia 05/23/2015   Peripheral vascular disease (HCC) 2006   dvt's and 50 yrs ago   Pleuritic pain 12/17/2015   PONV (postoperative nausea and vomiting)    Primary  localized osteoarthritis of right hip 08/26/2016   Primary osteoarthritis of right hip 08/26/2016   Pulmonary collapse 07/30/2010   Qualifier: Diagnosis of  By: Bertrum Brodie MD, Murali     PULMONARY NODULE 07/30/2010   Qualifier: Diagnosis of  By: Bertrum Brodie MD, Murali     Rash and nonspecific skin eruption 01/02/2015   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   Sarcoidosis    Sarcoidosis of lung (HCC)    Vaginal atrophy 09/24/2011   Visual field defect nasal step 01/02/2015    Past Surgical History:  Procedure Laterality Date   BACK SURGERY  2022   EYE SURGERY     FOOT SURGERY Left 06/2005   bunion hammer toe   HEMORRHOID SURGERY  12/1972   INTRAOCULAR LENS INSERTION     right eye 10 /16 and left 06/16/2005   JOINT REPLACEMENT     KYPHOPLASTY N/A 06/27/2021   Procedure: LUMBAR 3 AND LUMBAR  5 KYPHOPLASTY;  Surgeon: Virl Grimes, MD;  Location: [facility] OR;  Service: Orthopedics;  Laterality: N/A;   KYPHOPLASTY N/A 09/04/2021   Procedure: THORACIC SEVEN, THORACIC EIGHT, THORACIC NINE, THORACIC TEN KYPHOPLASTY;  Surgeon: Virl Grimes, MD;  Location: [facility] OR;  Service: Orthopedics;  Laterality: N/A;   REVERSE SHOULDER ARTHROPLASTY Left 12/05/2022   Procedure: REVERSE SHOULDER ARTHROPLASTY;  Surgeon: Winston Hawking, MD;  Location: [facility];  Service: Orthopedics;  Laterality: Left;  CHOICE WITH INTERSCALENE BLOCK   SKIN BIOPSY  01/2007   basal cell carcinoma removed from right lower leg    TOTAL HIP ARTHROPLASTY  06/2007   left   TOTAL HIP ARTHROPLASTY Right 08/26/2016   Procedure: TOTAL HIP ARTHROPLASTY ANTERIOR APPROACH;  Surgeon: Dayne Even, MD;  Location: [facility] OR;  Service: Orthopedics;  Laterality: Right;   TOTAL KNEE ARTHROPLASTY Right 10/2007   right    Family History: Family History  Problem Relation Age of Onset   Diabetes Mother    Heart failure Mother    Hypertension Father    Diabetes Father    Heart disease Father    Heart attack Father     Social History  reports that  she has never smoked. She has never used smokeless tobacco. She reports that she does not currently use alcohol . She reports that she does not use drugs.  No Known Allergies  Medications   Current Facility-Administered Medications:    clevidipine  (CLEVIPREX ) infusion 0.5 mg/mL, 0-21 mg/hr, Intravenous, Continuous, Long, Shereen Dike, MD   sodium chloride  flush (NS) 0.9 % injection 3 mL, 3 mL, Intravenous, Once, Long, Joshua G, MD  Current Outpatient Medications:    acyclovir  (ZOVIRAX ) 400 MG tablet, TAKE ONE (1) TABLET BY MOUTH TWO (2) TIMES DAILY, Disp: 60 tablet, Rfl: 2   amLODipine  (NORVASC ) 10 MG tablet, Take 10 mg by mouth daily., Disp: , Rfl:    b complex vitamins capsule, Take 1 capsule by mouth daily., Disp: , Rfl:    furosemide  (LASIX ) 20 MG tablet, Take 20 mg by mouth daily as needed for edema., Disp: , Rfl:    gemfibrozil  (LOPID ) 600 MG tablet, Take 600 mg by mouth 2 (two) times daily before a meal., Disp: , Rfl:    HYDROcodone -acetaminophen  (NORCO/VICODIN)  5-325 MG tablet, TAKE 1 TABLET BY MOUTH EVERY 6 HOURS AS NEEDED FOR MODERATE PAIN, Disp: 90 tablet, Rfl: 0   HYDROcodone -acetaminophen  (NORCO/VICODIN) 5-325 MG tablet, Take 1 tablet by mouth every 6 (six) hours as needed for moderate pain (pain score 4-6)., Disp: 90 tablet, Rfl: 0   levothyroxine  (SYNTHROID ) 50 MCG tablet, Take 50 mcg by mouth daily., Disp: , Rfl:    LORazepam  (ATIVAN ) 1 MG tablet, Take 0.5 mg by mouth at bedtime as needed for anxiety., Disp: , Rfl:    losartan  (COZAAR ) 50 MG tablet, Take 50 mg by mouth daily., Disp: , Rfl:    Multiple Vitamin (MULTIVITAMIN) capsule, Take 1 capsule by mouth daily., Disp: , Rfl:    naproxen  sodium (ALEVE ) 220 MG tablet, Take 440 mg by mouth daily as needed (pain)., Disp: , Rfl:    nitrofurantoin  (MACRODANTIN ) 100 MG capsule, daily., Disp: , Rfl:    oxyCODONE  (OXY IR/ROXICODONE ) 5 MG immediate release tablet, Take 1-2 tablets (5-10 mg total) by mouth every 6 (six) hours as needed for  severe pain (pain score 7-10)., Disp: 90 tablet, Rfl: 0   pantoprazole  (PROTONIX ) 20 MG tablet, Take 40 mg by mouth daily at 12 noon., Disp: , Rfl:    pregabalin  (LYRICA ) 25 MG capsule, TAKE 1 CAPSULE BY MOUTH TWICE DAILY, Disp: 60 capsule, Rfl: 1  Vitals   Vitals:   31-Aug-2023 1700  Weight: 50 kg    Body mass index is 22.26 kg/m.  Physical Exam   Constitutional: elderly woman in NAD Psych: Affect appropriate to situation.  Eyes: No scleral injection.  HENT: No OP obstruction.  Head: Normocephalic.  Cardiovascular: Normal rate and regular rhythm.  Respiratory: Effort normal, non-labored breathing.  GI: Soft.  No distension. There is no tenderness.  Skin: WDI.   Neurologic Examination   Neuro: Mental Status: Patient is awake, alert, oriented to person, place, month, year, and situation. Patient is not able to give a clear and coherent history. Cranial Nerves: II: Visual Fields are full. Right pupil >left pupil, both round, and reactive to light.   III,IV, VI: EOMI without ptosis or diploplia.  V: Facial sensation is symmetric to temperature VII: right facial droop VIII: Hearing is intact to voice X: Palate elevates symmetrically XI: Shoulder shrug is symmetric. XII: Tongue protrudes midline without atrophy or fasciculations.  Motor: Tone is normal. Bulk is normal. 5/5 strength was present left upper and left lower, right upper with drift and right lower unable to hold against gravity  Sensory: Sensation is symmetric to light touch No extinction to DSS present.  Cerebellar: FNF and HKS are intact bilaterally    Labs/Imaging/Neurodiagnostic studies   CBC:  Recent Labs  Lab Aug 31, 2023 1743 08/31/23 1752  WBC 7.1  --   NEUTROABS 5.3  --   HGB 8.3* 9.2*  HCT 25.1* 27.0*  MCV 94.0  --   PLT 296  --     Basic Metabolic Panel:  Lab Results  Component Value Date   NA 130 (L) 2023/08/31   K 2.8 (L) 08/31/23   CO2 23 07/31/2023   GLUCOSE 151 (H) 08/31/2023    BUN 12 2023/08/31   CREATININE 0.50 08/31/2023   CALCIUM 8.8 (L) 07/31/2023   GFRNONAA >60 07/31/2023   GFRAA 87 05/02/2019    Lipid Panel: No results found for: "LDLCALC"  HgbA1c: No results found for: "HGBA1C"  Urine Drug Screen: No results found for: "LABOPIA", "COCAINSCRNUR", "LABBENZ", "AMPHETMU", "THCU", "LABBARB"   Alcohol  Level No results found for: "  ETH"  INR  Lab Results  Component Value Date   INR 1.1 06/04/2021    APTT  Lab Results  Component Value Date   APTT 29 06/04/2021    AED levels: No results found for: "PHENYTOIN", "ZONISAMIDE", "LAMOTRIGINE", "LEVETIRACETA"    Code Stroke CT Head without contrast(Personally reviewed): large acute left SDH with 5mm left to right shift.  Mass effect with midline shift consistent with Traumatic Brain Compression   ASSESSMENT   [name] Page is a 89 y.o. presents after having an unwitnessed fall, unknown LOC and was noted to have right side weakness and aphasia. CT head with large acute Left SDH  RECOMMENDATIONS  - consult to neurosurgery done by EDP - BP goal < 150 - labetalol 10mg  IV x 1 now  - reconsult neurology if needed  ______________________________________________________________  Jonette Nestle DNP, ACNPC-AG  [facility]  I have seen the patient and was present for the entirety of the evaluation and management reflected in the above note.  Ms. barnhouse has a fairly large subdural hematoma which is very unfortunate especially considering her age.  She will need emergent surgical consultation to help discussed risks and benefits and considerations involved in taking a 89 year old to surgery and whether that would be in her best interest.  At this time, we will defer the management to neurosurgery and we will be available as needed.  This patient is critically ill and at significant risk of neurological worsening, death and care requires constant monitoring of vital signs,  hemodynamics,respiratory and cardiac monitoring, neurological assessment, discussion with family, other specialists and medical decision making of high complexity. I spent 35 minutes of neurocritical care time  in the care of  this patient. This was time spent independent of any time provided by nurse practitioner or PA.  Ann Keto, MD [facility] [phone]  If 7pm- 7am, please page neurology on call as listed in AMION. 08/26/2023  8:06 PM

## 2023-08-26 NOTE — Assessment & Plan Note (Addendum)
 Cont home Oxy PRN. Has anemia but this looks chronic and stable Normal platelets.

## 2023-08-26 NOTE — ED Triage Notes (Addendum)
 PT BIB [facility] from home.Husband found PT lying in the hallway with a  altered mental status.PT's LNK was at 1600 when the husband left to go to the store. Husband returned at 1630. EMS  was called at 1730 and when they arrived the PT had slurred speech, right side weakness and her right pupil was larger than the left.

## 2023-08-27 DIAGNOSIS — S065XAA Traumatic subdural hemorrhage with loss of consciousness status unknown, initial encounter: Secondary | ICD-10-CM | POA: Diagnosis not present

## 2023-08-27 MED ORDER — POTASSIUM CHLORIDE CRYS ER 20 MEQ PO TBCR
20.0000 meq | EXTENDED_RELEASE_TABLET | Freq: Three times a day (TID) | ORAL | Status: AC
  Administered 2023-08-27 – 2023-08-28 (×4): 20 meq via ORAL
  Filled 2023-08-27 (×4): qty 1

## 2023-08-27 NOTE — Progress Notes (Signed)
Arrived to [facility] 3W Room 3W 08 from Emergency Department. Placed on cardiac monitor and vital signs obtained.     08/27/23 1521  Vitals  Temp 98.5 F (36.9 C)  Temp Source Oral  BP (!) 153/65  MAP (mmHg) 92  BP Location Right Arm  BP Method Automatic  Patient Position (if appropriate) Lying  Pulse Rate 85  Pulse Rate Source Monitor  Resp 17  MEWS COLOR  MEWS Score Color Green  Oxygen Therapy  SpO2 100 %  O2 Device Room Air  MEWS Score  MEWS Temp 0  MEWS Systolic 0  MEWS Pulse 0  MEWS RR 0  MEWS LOC 0  MEWS Score 0

## 2023-08-27 NOTE — TOC CAGE-AID Note (Signed)
Transition of Care [facility]) - CAGE-AID Screening   Patient Details  Name: [name] Page MRN: [id number] Date of Birth: Sep 03, 1930  Transition of Care [facility]) CM/SW Contact:    Leota Sauers, RN Phone Number: 08/27/2023, 6:10 AM   Clinical Narrative:  Patient denies the use of alcohol and illicit substances. Resources not given at this time.   CAGE-AID Screening:    Have You Ever Felt You Ought to Cut Down on Your Drinking or Drug Use?: No Have People Annoyed You By Critizing Your Drinking Or Drug Use?: No Have You Felt Bad Or Guilty About Your Drinking Or Drug Use?: No Have You Ever Had a Drink or Used Drugs First Thing In The Morning to Steady Your Nerves or to Get Rid of a Hangover?: No CAGE-AID Score: 0  Substance Abuse Education Offered: No

## 2023-08-27 NOTE — ED Notes (Signed)
Pt well appearing upon transport to floor.

## 2023-08-27 NOTE — ED Notes (Signed)
Pt had bowel movement, cleaned and changed into new brief. Pt attempted to void, unable to at this time.

## 2023-08-27 NOTE — Plan of Care (Signed)
  Problem: Education: Goal: Knowledge of disease or condition will improve Outcome: Progressing Goal: Knowledge of secondary prevention will improve (MUST DOCUMENT ALL) Outcome: Progressing Goal: Knowledge of patient specific risk factors will improve (DELETE if not current risk factor) Outcome: Progressing   Problem: Intracerebral Hemorrhage Tissue Perfusion: Goal: Complications of Intracerebral Hemorrhage will be minimized Outcome: Progressing   Problem: Coping: Goal: Will verbalize positive feelings about self Outcome: Progressing

## 2023-08-27 NOTE — Progress Notes (Signed)
PROGRESS NOTE    [name] Page  JXB:[id number] DOB: [birth year] DOA: 08/26/2023 PCP: Geoffry Paradise, MD    Brief Narrative:  89 year old with history of DVT not on anticoagulation, hypertension, hypothyroidism, chronic pain medications, GERD who is living at an independent living apartment at [facility] presented with unwitnessed fall, aphasia and right-sided weakness.  Husband came home to find her on the floor.  Called EMS.  Patient was aphasic on arrival.  Blood pressure was 180/90.  A-fib on the monitor.  CT head with large acute left SDH with 5 mm left-to-right shift.  Not on anticoagulation.  Admitted with neurosurgery and neurology consultation.  Recommended conservative management.  Subjective: Patient seen and examined.  She is still slightly confused.  Her voice is more clear as per husband at bedside.  Patient is still unable to comprehend full conversation, however she was able to tell me that they live in [facility].  She is not sure why she is in the hospital. Assessment & Plan:   Acute left subdural hematoma with midline shift, aphasia. Seen by neurosurgery and neurology.  Recommended against emergent surgical evacuation of hematoma by neurosurgery. Continue to monitor in the hospital.  Blood pressure goal less than 150.  Resume home blood pressure medications. Mobilize with PT OT.  Refer to SNF at [facility] before going back to independent living. PT/OT/speech.  Hypokalemia: Significant with potassium 2.8 on presentation.  Replace again today.  Recheck levels tomorrow morning.  Chronic medical issues Essential hypertension, blood pressure stable today.  Resume all home medications. Multiple myeloma, chronic pain syndrome, on oxycodone and Lyrica.  Continue. Hypothyroidism, on Synthroid.  Continue.    DVT prophylaxis: SCD's Start: 08/26/23 2018   Code Status: DNR with limited intervention Family Communication: Husband at the bedside Disposition Plan:  Status is: Inpatient Remains inpatient appropriate because: Inpatient monitoring needed     Consultants:  Neurology Neurosurgery  Procedures:  None  Antimicrobials:  Macrobid, long-term medication     Objective: Vitals:   08/27/23 0545 08/27/23 0600 08/27/23 0943 08/27/23 1000  BP: (!) 146/54 (!) 156/59 (!) 173/75 (!) 159/61  Pulse: (!) 58 61  68  Resp: 12 13  15   Temp:    98.1 F (36.7 C)  TempSrc:    Oral  SpO2: 97% 98%  100%  Weight:      Height:        Intake/Output Summary (Last 24 hours) at 08/27/2023 1100 Last data filed at 08/27/2023 0127 Gross per 24 hour  Intake 302.2 ml  Output --  Net 302.2 ml   Filed Weights   08/26/23 1700 08/26/23 2035  Weight: 50 kg 50 kg    Examination:  General exam: Appears calm and comfortable.  Not in any distress. Respiratory system: No added sounds. Cardiovascular system: S1 & S2 heard, RRR.  Gastrointestinal system: Soft.  Nontender.  Bowel sound present. Central nervous system: Alert and awake.  She is oriented to herself and family. Extremities: No swelling or edema. Right side is weaker than left side.    Data Reviewed: I have personally reviewed following labs and imaging studies  CBC: Recent Labs  Lab 08/26/23 1743 08/26/23 1752  WBC 7.1  --   NEUTROABS 5.3  --   HGB 8.3* 9.2*  HCT 25.1* 27.0*  MCV 94.0  --   PLT 296  --    Basic Metabolic Panel: Recent Labs  Lab 08/26/23 1743 08/26/23 1752  NA 130* 130*  K 2.8* 2.8*  CL  99 100  CO2 18*  --   GLUCOSE 156* 151*  BUN 11 12  CREATININE 0.66 0.50  CALCIUM 8.8*  --    GFR: Estimated Creatinine Clearance: 31.9 mL/min (by C-G formula based on SCr of 0.5 mg/dL). Liver Function Tests: Recent Labs  Lab 08/26/23 1743  AST 29  ALT 12  ALKPHOS 294*  BILITOT 1.0  PROT 6.6  ALBUMIN 3.4*   No results for input(s): "LIPASE", "AMYLASE" in the last 168 hours. No results for input(s): "AMMONIA" in the last 168 hours. Coagulation Profile: Recent  Labs  Lab 08/26/23 1743  INR 1.1   Cardiac Enzymes: No results for input(s): "CKTOTAL", "CKMB", "CKMBINDEX", "TROPONINI" in the last 168 hours. BNP (last 3 results) No results for input(s): "PROBNP" in the last 8760 hours. HbA1C: No results for input(s): "HGBA1C" in the last 72 hours. CBG: Recent Labs  Lab 08/26/23 1746  GLUCAP 145*   Lipid Profile: No results for input(s): "CHOL", "HDL", "LDLCALC", "TRIG", "CHOLHDL", "LDLDIRECT" in the last 72 hours. Thyroid Function Tests: No results for input(s): "TSH", "T4TOTAL", "FREET4", "T3FREE", "THYROIDAB" in the last 72 hours. Anemia Panel: No results for input(s): "VITAMINB12", "FOLATE", "FERRITIN", "TIBC", "IRON", "RETICCTPCT" in the last 72 hours. Sepsis Labs: No results for input(s): "PROCALCITON", "LATICACIDVEN" in the last 168 hours.  No results found for this or any previous visit (from the past 240 hours).       Radiology Studies: DG Pelvis Portable Result Date: 08/26/2023 CLINICAL DATA:  fall EXAM: PORTABLE PELVIS 1-2 VIEWS COMPARISON:  CT right hip 07/31/2023 FINDINGS: Limited evaluation due to overlapping osseous structures and overlying soft tissues. Total bilateral hip arthroplasty. Kyphoplasty of the visualized lower lumbar spine. Redemonstration of cortical irregularity along the right superior pubic rami that correlates with question of a than acute now subacute fracture noted on CT right hip 07/31/2023. Question irregularity of the inferior right superior pubic ram no pelvic diastasis. No pelvic bone lesions are seen. Stool within the rectum. IMPRESSION: 1. Recommend CT. 2. Redemonstration of cortical irregularity along the right superior pubic rami that correlates with question of a than acute now subacute fracture noted on CT right hip 07/31/2023. Superimposed acute component not excluded. 3. Question irregularity of the inferior right superior pubic rami- limited evaluation due to overlapping osseous structures and  overlying soft tissues. 4. Total bilateral hip arthroplasty. 5. Kyphoplasty of the visualized lower lumbar spine. Electronically Signed   By: Tish Frederickson M.D.   On: 08/26/2023 19:38   CT Cervical Spine Wo Contrast Result Date: 08/26/2023 CLINICAL DATA:  Neck trauma (Age >= 65y) EXAM: CT CERVICAL SPINE WITHOUT CONTRAST TECHNIQUE: Multidetector CT imaging of the cervical spine was performed without intravenous contrast. Multiplanar CT image reconstructions were also generated. RADIATION DOSE REDUCTION: This exam was performed according to the departmental dose-optimization program which includes automated exposure control, adjustment of the mA and/or kV according to patient size and/or use of iterative reconstruction technique. COMPARISON:  CT cervical spine 07/31/2023 FINDINGS: Alignment: Grade 1 anterolisthesis of C2 on C3. Mild retrolisthesis C3 on C4 mild retrolisthesis of C5 on C6. Skull base and vertebrae: Multilevel severe degenerative change of the spine with associated severe osseous neural foraminal stenosis at the bilateral C3-C4 and right C4-C5 levels. No acute fracture. No aggressive appearing focal osseous lesion or focal pathologic process. Soft tissues and spinal canal: No prevertebral fluid or swelling. No visible canal hematoma. Upper chest: Unremarkable. Other: Atherosclerotic calcifications are present within the cavernous internal carotid and vertebral arteries. IMPRESSION: 1.  No acute displaced fracture or traumatic listhesis of the cervical spine. 2. Multilevel severe degenerative change of the spine with associated severe osseous neural foraminal stenosis at the bilateral C3-C4 and right C4-C5 levels. Electronically Signed   By: Tish Frederickson M.D.   On: 08/26/2023 19:34   DG Chest Portable 1 View Result Date: 08/26/2023 CLINICAL DATA:  Fall. EXAM: PORTABLE CHEST 1 VIEW COMPARISON:  July 23, 2021. FINDINGS: Stable cardiomediastinal silhouette. Status post left shoulder  arthroplasty. Hypoinflation of the lungs with minimal bibasilar subsegmental atelectasis. Status post kyphoplasty at multiple levels of the thoracic spine. IMPRESSION: Hypoinflation of the lungs with minimal bibasilar subsegmental atelectasis. Electronically Signed   By: Lupita Raider M.D.   On: 08/26/2023 19:15   CT HEAD CODE STROKE WO CONTRAST Result Date: 08/26/2023 CLINICAL DATA:  Code stroke.  Aphasia, right-sided deficit EXAM: CT HEAD WITHOUT CONTRAST TECHNIQUE: Contiguous axial images were obtained from the base of the skull through the vertex without intravenous contrast. RADIATION DOSE REDUCTION: This exam was performed according to the departmental dose-optimization program which includes automated exposure control, adjustment of the mA and/or kV according to patient size and/or use of iterative reconstruction technique. COMPARISON:  07/31/2023 FINDINGS: Brain: Large left cerebral convexity subdural hematoma, which is mixed density, favored to be acute and, which measures up to 2.8 cm (series 6, image 15), with significant mass effect on the underlying left cerebral hemisphere, particularly the left parietal lobe. 5 mm of left-to-right midline shift. Additional subarachnoid hemorrhage in the right temporal sulci (series 6, image 21). No evidence of acute infarct, mass, hydrocephalus, or intraventricular hemorrhage. Vascular: No hyperdense vessel. Skull: Negative for fracture or focal lesion. Sinuses/Orbits: No acute finding. Other: The mastoid air cells are well aerated. IMPRESSION: 1. Large left cerebral convexity subdural hematoma, which is mixed density, favored to be acute, which measures up to 2.8 cm, with mass effect on the underlying left cerebral hemisphere, particularly the left parietal lobe, and 5 mm of left-to-right midline shift. 2. Additional subarachnoid hemorrhage in the right temporal sulci. Imaging results were communicated on 08/26/2023 at 6:00 pm to provider Dr. Amada Jupiter via  secure text paging. Electronically Signed   By: Wiliam Ke M.D.   On: 08/26/2023 18:02        Scheduled Meds:   stroke: early stages of recovery book   Does not apply Once   acyclovir  400 mg Oral BID   amLODipine  10 mg Oral Daily   gemfibrozil  600 mg Oral BID AC   levothyroxine  50 mcg Oral Q0600   losartan  50 mg Oral Daily   nitrofurantoin  100 mg Oral Daily   pantoprazole (PROTONIX) IV  40 mg Intravenous QHS   potassium chloride  20 mEq Oral TID   pregabalin  25 mg Oral BID   senna-docusate  1 tablet Oral BID   Continuous Infusions:  lactated ringers 50 mL/hr at 08/26/23 2052     LOS: 1 day    Time spent: 40 minutes    Dorcas Carrow, MD [facility]

## 2023-08-27 NOTE — Plan of Care (Signed)
  Problem: Coping: Goal: Will verbalize positive feelings about self Outcome: Progressing Goal: Will identify appropriate support needs Outcome: Progressing   

## 2023-08-28 ENCOUNTER — Other Ambulatory Visit: Payer: Medicare PPO

## 2023-08-28 ENCOUNTER — Ambulatory Visit: Payer: Medicare PPO

## 2023-08-28 ENCOUNTER — Ambulatory Visit: Payer: Medicare PPO | Admitting: Hematology and Oncology

## 2023-08-28 ENCOUNTER — Ambulatory Visit: Payer: Medicare PPO | Admitting: Nurse Practitioner

## 2023-08-28 DIAGNOSIS — S065XAA Traumatic subdural hemorrhage with loss of consciousness status unknown, initial encounter: Secondary | ICD-10-CM | POA: Diagnosis not present

## 2023-08-28 LAB — CBC WITH DIFFERENTIAL/PLATELET
Abs Immature Granulocytes: 0.03 10*3/uL (ref 0.00–0.07)
Basophils Absolute: 0 10*3/uL (ref 0.0–0.1)
Basophils Relative: 0 %
Eosinophils Absolute: 0 10*3/uL (ref 0.0–0.5)
Eosinophils Relative: 0 %
HCT: 23.8 % — ABNORMAL LOW (ref 36.0–46.0)
Hemoglobin: 8.4 g/dL — ABNORMAL LOW (ref 12.0–15.0)
Immature Granulocytes: 0 %
Lymphocytes Relative: 7 %
Lymphs Abs: 0.7 10*3/uL (ref 0.7–4.0)
MCH: 31.5 pg (ref 26.0–34.0)
MCHC: 35.3 g/dL (ref 30.0–36.0)
MCV: 89.1 fL (ref 80.0–100.0)
Monocytes Absolute: 0.8 10*3/uL (ref 0.1–1.0)
Monocytes Relative: 9 %
Neutro Abs: 7.5 10*3/uL (ref 1.7–7.7)
Neutrophils Relative %: 84 %
Platelets: 342 10*3/uL (ref 150–400)
RBC: 2.67 MIL/uL — ABNORMAL LOW (ref 3.87–5.11)
RDW: 15.4 % (ref 11.5–15.5)
WBC: 9 10*3/uL (ref 4.0–10.5)
nRBC: 0 % (ref 0.0–0.2)

## 2023-08-28 LAB — COMPREHENSIVE METABOLIC PANEL
ALT: 16 U/L (ref 0–44)
AST: 39 U/L (ref 15–41)
Albumin: 3.2 g/dL — ABNORMAL LOW (ref 3.5–5.0)
Alkaline Phosphatase: 301 U/L — ABNORMAL HIGH (ref 38–126)
Anion gap: 11 (ref 5–15)
BUN: 10 mg/dL (ref 8–23)
CO2: 18 mmol/L — ABNORMAL LOW (ref 22–32)
Calcium: 8.7 mg/dL — ABNORMAL LOW (ref 8.9–10.3)
Chloride: 99 mmol/L (ref 98–111)
Creatinine, Ser: 0.57 mg/dL (ref 0.44–1.00)
GFR, Estimated: >60 mL/min (ref >60)
Glucose, Bld: 115 mg/dL — ABNORMAL HIGH (ref 70–99)
Potassium: 2.8 mmol/L — ABNORMAL LOW (ref 3.5–5.1)
Sodium: 128 mmol/L — ABNORMAL LOW (ref 135–145)
Total Bilirubin: 1 mg/dL (ref 0.0–1.2)
Total Protein: 6.4 g/dL — ABNORMAL LOW (ref 6.5–8.1)

## 2023-08-28 LAB — PHOSPHORUS: Phosphorus: 2 mg/dL — ABNORMAL LOW (ref 2.5–4.6)

## 2023-08-28 LAB — MAGNESIUM: Magnesium: 1.7 mg/dL (ref 1.7–2.4)

## 2023-08-28 MED ORDER — QUETIAPINE FUMARATE 25 MG PO TABS
12.5000 mg | ORAL_TABLET | Freq: Two times a day (BID) | ORAL | Status: DC
  Administered 2023-08-28 – 2023-08-30 (×5): 12.5 mg via ORAL
  Filled 2023-08-28 (×5): qty 1

## 2023-08-28 MED ORDER — POTASSIUM PHOSPHATES 15 MMOLE/5ML IV SOLN
20.0000 mmol | Freq: Once | INTRAVENOUS | Status: AC
  Administered 2023-08-28: 20 mmol via INTRAVENOUS
  Filled 2023-08-28: qty 6.67

## 2023-08-28 MED ORDER — LOPERAMIDE HCL 2 MG PO CAPS
2.0000 mg | ORAL_CAPSULE | ORAL | Status: DC | PRN
  Administered 2023-08-28: 2 mg via ORAL
  Filled 2023-08-28: qty 1

## 2023-08-28 MED ORDER — MORPHINE SULFATE (PF) 2 MG/ML IV SOLN
2.0000 mg | INTRAVENOUS | Status: DC | PRN
  Administered 2023-08-28 – 2023-08-29 (×2): 2 mg via INTRAVENOUS
  Filled 2023-08-28 (×2): qty 1

## 2023-08-28 MED ORDER — POTASSIUM CHLORIDE CRYS ER 20 MEQ PO TBCR
20.0000 meq | EXTENDED_RELEASE_TABLET | Freq: Three times a day (TID) | ORAL | Status: AC
  Administered 2023-08-28 – 2023-08-29 (×4): 20 meq via ORAL
  Filled 2023-08-28 (×4): qty 1

## 2023-08-28 NOTE — Evaluation (Signed)
Physical Therapy Evaluation Patient Details Name: [name] Page MRN: [id number] DOB: [birth year] Today's Date: 08/28/2023  History of Present Illness  Patient presenting to the ED with difficulty speaking and R sided weakness after being found down by her husband on 08/27/23. CT finding large L  cerebral SDH with midline shift, and SAH in R temporal sulci.  PMH includes:  DVT not on anticoagulation, hypertension, hypothyroidism, chronic pain medications, GERD  Clinical Impression  Per notes pt living with husband at [facility]. Pt with decreased communication, and likely some cognitive difficulty as well, however can indicate yes and no. Pt found trying to get out of her bed between footboard and rail with bed alarm sounding. Pt found to be incontinent of stool and when asks if she was trying to get to bathroom she nod yes. PT indicated that pt should return to bed so that she could be cleaned however pt stands, needing min A for steadying, attempted to have her take steps to [facility] but pt with grimace and R knee buckling. Once returned to bed requires min A for rolling for pericare and linen change. Patient will benefit from continued inpatient follow up therapy, <3 hours/day. PT will continue to follow acutely.      If plan is discharge home, recommend the following: A lot of help with walking and/or transfers;A lot of help with bathing/dressing/bathroom;Assistance with cooking/housework;Direct supervision/assist for medications management;Direct supervision/assist for financial management;Assist for transportation;Help with stairs or ramp for entrance;Supervision due to cognitive status   Can travel by private vehicle   No    Equipment Recommendations None recommended by PT     Functional Status Assessment Patient has had a recent decline in their functional status and demonstrates the ability to make significant improvements in function in a reasonable and predictable amount of time.      Precautions / Restrictions Precautions Precautions: Other (comment) Precaution Comments: BP < 150 Restrictions Weight Bearing Restrictions Per Provider Order: No      Mobility  Bed Mobility Overal bed mobility: Needs Assistance Bed Mobility: Sit to Supine, Rolling       Sit to supine: Min assist   General bed mobility comments: min A for bringing LE back into bed, min A and multimodal cuing for rolling R and L for pericare and linen change    Transfers Overall transfer level: Needs assistance Equipment used: 1 person hand held assist Transfers: Sit to/from Stand, Bed to chair/wheelchair/BSC Sit to Stand: Min assist          Lateral/Scoot Transfers: Total assist General transfer comment: pt attempting to exit bed on entry bed alarm sounding pt found to be incontinent of stool.When bed rail put down pt immediately stood needing min A for steadying  When told PT was going to get her back to bed to help clean her up pt calmed. attempted to have pt take lateral steps towards HoB but pt unable to take steps due to pain in R LE    Ambulation/Gait               General Gait Details: unable      Balance Overall balance assessment: Needs assistance Sitting-balance support: Bilateral upper extremity supported, Feet supported Sitting balance-Leahy Scale: Fair Sitting balance - Comments: could not accept challenge   Standing balance support: Bilateral upper extremity supported, During functional activity, Reliant on assistive device for balance Standing balance-Leahy Scale: Poor Standing balance comment: reliant on external support from OT  Pertinent Vitals/Pain Pain Assessment Pain Assessment: Faces Faces Pain Scale: Hurts little more Pain Location: RLE with standing Pain Descriptors / Indicators: Grimacing, Guarding, Moaning Pain Intervention(s): Limited activity within patient's tolerance, Monitored during session,  Repositioned    Home Living Family/patient expects to be discharged to:: Other (Comment) (Independent living) Living Arrangements: Spouse/significant other ([place] with spouse)   Type of Home: Independent living facility                  Prior Function Prior Level of Function : Needs assist             Mobility Comments: per chart has had multiple falls, patient currently unable to state ADLs Comments: per chart spouse assists with ADLs, appears to have declined over the last few months with regards to overall independence     Extremity/Trunk Assessment   Upper Extremity Assessment Upper Extremity Assessment: Defer to OT evaluation    Lower Extremity Assessment Lower Extremity Assessment: RLE deficits/detail;Difficult to assess due to impaired cognition;Generalized weakness RLE Deficits / Details: R LE pain with weightbearing,    Cervical / Trunk Assessment Cervical / Trunk Assessment: Normal  Communication   Communication Communication: Difficulty following commands/understanding;Difficulty communicating thoughts/reduced clarity of speech Following commands: Follows one step commands with increased time Cueing Techniques: Tactile cues;Visual cues;Verbal cues;Gestural cues  Cognition Arousal: Alert Behavior During Therapy: Restless, Anxious, Impulsive Overall Cognitive Status: Impaired/Different from baseline Area of Impairment: Attention, Orientation, Memory, Following commands, Safety/judgement, Awareness, Problem solving                 Orientation Level: Time, Place, Situation Current Attention Level: Focused Memory: Decreased recall of precautions, Decreased short-term memory Following Commands: Follows one step commands with increased time Safety/Judgement: Decreased awareness of deficits, Decreased awareness of safety Awareness: Intellectual Problem Solving: Difficulty sequencing, Requires verbal cues, Requires tactile cues General  Comments: Patient with aphasic presentation, however when in a calm state, is able to follow commands. Patient with frequent attempts to get OOB, however was soiled and per RN was engaging in the same behavior when soiled earlier in shift        General Comments General comments (skin integrity, edema, etc.): PT entered room as pt was trying to exit bed from between footboard and rail with alarm sounding. Pt immediately noted to have been incontinent of stool and when asked if she was trying to get to the bathroom pt nodded yes.    Exercises     Assessment/Plan    PT Assessment    PT Problem List         PT Treatment Interventions      PT Goals (Current goals can be found in the Care Plan section)  Acute Rehab PT Goals PT Goal Formulation: Patient unable to participate in goal setting Time For Goal Achievement: 09/11/23 Potential to Achieve Goals: Fair    Frequency Min 1X/week        AM-PAC PT "6 Clicks" Mobility  Outcome Measure Help needed turning from your back to your side while in a flat bed without using bedrails?: A Little Help needed moving from lying on your back to sitting on the side of a flat bed without using bedrails?: A Little Help needed moving to and from a bed to a chair (including a wheelchair)?: A Lot Help needed standing up from a chair using your arms (e.g., wheelchair or bedside chair)?: A Lot Help needed to walk in hospital room?: Total Help needed climbing 3-5 steps with  a railing? : Total 6 Click Score: 12    End of Session   Activity Tolerance: Patient tolerated treatment well Patient left: in bed;with call bell/phone within reach;with bed alarm set Nurse Communication: Mobility status PT Visit Diagnosis: Unsteadiness on feet (R26.81);Other abnormalities of gait and mobility (R26.89);Repeated falls (R29.6);Muscle weakness (generalized) (M62.81);Difficulty in walking, not elsewhere classified (R26.2);Other symptoms and signs involving the nervous  system (R29.898)    Time: 1610-9604 PT Time Calculation (min) (ACUTE ONLY): 32 min   Charges:   PT Evaluation $PT Eval Moderate Complexity: 1 Mod PT Treatments $Therapeutic Activity: 8-22 mins PT General Charges $$ ACUTE PT VISIT: 1 Visit         Cynethia Schindler B. Beverely Risen PT, DPT Acute Rehabilitation Services Please use secure chat or  Call Office [phone]   [facility]on Alas [facility] 08/28/2023, 12:54 PM

## 2023-08-28 NOTE — Evaluation (Signed)
Speech Language Pathology Evaluation Patient Details Name: [name] Page MRN: [id number] DOB: [birth year] Today's Date: 08/28/2023 Time: 4098-1191 SLP Time Calculation (min) (ACUTE ONLY): 23 min  Problem List:  Patient Active Problem List   Diagnosis Date Noted   SDH (subdural hematoma) (HCC) 08/26/2023   H/O total shoulder replacement, left 12/05/2022   Vitamin B12 deficiency 11/22/2021   Anxiety 11/01/2021   Chronic idiopathic constipation 11/01/2021   DVT (deep venous thrombosis) (HCC) 11/01/2021   Insomnia 11/01/2021   RBBB 11/01/2021   PONV (postoperative nausea and vomiting) 11/01/2021   Pathological fracture of vertebra 11/01/2021   Skin lesion 11/01/2021   Multiple myeloma (HCC) 10/08/2021   Difficulty in walking, not elsewhere classified 06/27/2021   Gastroesophageal reflux disease 06/27/2021   Generalized anxiety disorder 06/27/2021   Hypothyroidism, unspecified 06/27/2021   Long term (current) use of systemic steroids 06/27/2021   Muscle weakness (generalized) 06/27/2021   Other acute postprocedural pain 06/27/2021   Other chronic allergic conjunctivitis 06/27/2021   Personal history of other venous thrombosis and embolism 06/27/2021   Other specified disorders of bone density and structure, unspecified site 06/27/2021   Other muscle spasm 06/27/2021   Tear of skin of right wrist 02/12/2021   Degeneration of lumbar intervertebral disc 01/17/2020   Hardening of the aorta (main artery of the heart) (HCC) 09/22/2019   Vasovagal syncope 09/20/2019   Migraine without aura, not refractory 03/14/2019   Shoulder joint pain 10/15/2017   Jaw pain 06/12/2017   Alopecia 10/28/2016   Primary localized osteoarthritis of right hip 08/26/2016   Primary osteoarthritis of right hip 08/26/2016   Pleuritic pain 12/17/2015   Non-thrombocytopenic purpura (HCC) 09/24/2015   Osteopenia 05/23/2015   Cystocele 05/23/2015   Rash and nonspecific skin eruption 01/02/2015   Visual field  defect nasal step 01/02/2015   Encounter for therapeutic drug monitoring 10/03/2014   Chronic cough 07/12/2014   Nasal itching 01/30/2014   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   Sarcoidosis 01/30/2014   Bronchial stenosis, left 07/20/2013   Cystocele, midline 05/02/2013   Chest pain    History of recurrent UTIs 09/24/2011   Vaginal atrophy 09/24/2011   Orbital disorder 06/05/2011   Orbital lesion 06/05/2011   HYPERLIPIDEMIA 07/30/2010   Essential hypertension 07/30/2010   DEEP VEIN THROMBOSIS/PHLEBITIS 07/30/2010   PULMONARY COLLAPSE 07/30/2010   PULMONARY NODULE 07/30/2010   COUGH 07/30/2010   Osteoarthritis 07/02/2009   Past Medical History:  Past Medical History:  Diagnosis Date   Alopecia 10/28/2016   Anginal pain (HCC)    Anxiety    Arthritis    Cancer (HCC)    Cervical spondylosis    C3-4 AND C4-5   Chest pain 07/28/2008   H/O, normal stress nuclear EF 78%   Chronic cough 07/12/2014   Collapsed lung 07/2010   being treated at [facility] - in left lung   Complication of anesthesia    Cough 07/30/2010   Qualifier: Diagnosis of  By: Marchelle Gearing MD, Murali     Cystocele 05/23/2015   Cystocele, midline 05/02/2013   DEEP VEIN THROMBOSIS/PHLEBITIS 07/30/2010   Qualifier: History of  By: Yancey Flemings CMA, Jennifer     Dislocation closed, shoulder 02/2013   DVT (deep venous thrombosis) (HCC) 2006   Encounter for therapeutic drug monitoring 10/03/2014   GERD (gastroesophageal reflux disease)    occ   Headache    hx   High cholesterol    History of recurrent UTIs 09/24/2011   HYPERLIPIDEMIA 07/30/2010   Qualifier: Diagnosis of  By: Yancey Flemings  CMA, Victorino Dike     Hypertension    HYPERTENSION 07/30/2010   Qualifier: Diagnosis of  By: Yancey Flemings CMA, Jennifer     Hypothyroidism    Nasal itching 01/30/2014   Osteopenia 05/23/2015   Peripheral vascular disease (HCC) 2006   dvt's and 50 yrs ago   Pleuritic pain 12/17/2015   PONV (postoperative nausea and vomiting)    Primary  localized osteoarthritis of right hip 08/26/2016   Primary osteoarthritis of right hip 08/26/2016   Pulmonary collapse 07/30/2010   Qualifier: Diagnosis of  By: Marchelle Gearing MD, Murali     PULMONARY NODULE 07/30/2010   Qualifier: Diagnosis of  By: Marchelle Gearing MD, Murali     Rash and nonspecific skin eruption 01/02/2015   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   Sarcoidosis    Sarcoidosis of lung (HCC)    Vaginal atrophy 09/24/2011   Visual field defect nasal step 01/02/2015   Past Surgical History:  Past Surgical History:  Procedure Laterality Date   BACK SURGERY  2022   EYE SURGERY     FOOT SURGERY Left 06/2005   bunion hammer toe   HEMORRHOID SURGERY  12/1972   INTRAOCULAR LENS INSERTION     right eye 10 /16 and left 06/16/2005   JOINT REPLACEMENT     KYPHOPLASTY N/A 06/27/2021   Procedure: LUMBAR 3 AND LUMBAR  5 KYPHOPLASTY;  Surgeon: Estill Bamberg, MD;  Location: [facility] OR;  Service: Orthopedics;  Laterality: N/A;   KYPHOPLASTY N/A 09/04/2021   Procedure: THORACIC SEVEN, THORACIC EIGHT, THORACIC NINE, THORACIC TEN KYPHOPLASTY;  Surgeon: Estill Bamberg, MD;  Location: [facility] OR;  Service: Orthopedics;  Laterality: N/A;   REVERSE SHOULDER ARTHROPLASTY Left 12/05/2022   Procedure: REVERSE SHOULDER ARTHROPLASTY;  Surgeon: Beverely Low, MD;  Location: [facility] ORS;  Service: Orthopedics;  Laterality: Left;  CHOICE WITH INTERSCALENE BLOCK   SKIN BIOPSY  01/2007   basal cell carcinoma removed from right lower leg    TOTAL HIP ARTHROPLASTY  06/2007   left   TOTAL HIP ARTHROPLASTY Right 08/26/2016   Procedure: TOTAL HIP ARTHROPLASTY ANTERIOR APPROACH;  Surgeon: Marcene Corning, MD;  Location: [facility] OR;  Service: Orthopedics;  Laterality: Right;   TOTAL KNEE ARTHROPLASTY Right 10/2007   right   HPI:  [name] Page is a 89 year old who is living at an independent living apartment at [facility] presented with unwitnessed fall, aphasia and right-sided weakness. CT head 1/15: "1. Large left cerebral  convexity subdural hematoma, which is mixed density, favored to be acute, which measures up to 2.8 cm, with mass effect on the underlying left cerebral hemisphere, particularly the left parietal lobe, and 5 mm of left-to-right midline shift.  2. Additional subarachnoid hemorrhage in the right temporal sulci."  Pt with history of DVT not on anticoagulation, hypertension, hypothyroidism, chronic pain medications, GERD.   Assessment / Plan / Recommendation Clinical Impression  Pt presents with severe expressive and receptive language deficits in setting of new large L SDH.  Pt acknowledged SLP on arrival.  She was unable to state her name.  She was unable to complete automatic speech tasks with choral recitation support.  She did not attempt to participate in singing task.  She attempted to answer personal and immediate environment yes/no questions, but only performed at chance.  With object identification from field of 2 she performed with 60% accuracy (3 of 5) which may be consistent with chance. She began attempting to get out of bed and was difficult to redirect.  Her attention following this was  significantly decreased.  She did not follow 1 step commands with tactlie cuing and visual model.  She did not attempt to name objects for confrontational naming task. She did not benefit from semantic or phonemic cuing. She did not demonstrate use of objects.  Her spontaneous speech was devoid of content and consistent mostly of low volume mumbling.  At one point when responding to a completely different task it sounded like pt said "one two three four five" which was significantly delayed to attempted counting task at beginning of session.  When redirected to remain in bed she began pointing at SLP and said "No, no, no, no, no, no" which was her most succesful communcation during this evaluaiton. This sounded clear, but otherwise unable to assess speech intelligibility during this session given severity of aphasia.   At this point, given unreliability with yes/no questions and object ID, impulsive behavior, and quickness to agitation, pt is not likely to benefit from communication board.  SLP will continue to follow for communication while in house and she will need ongoing ST at next level of care.    SLP Assessment  SLP Recommendation/Assessment: Patient needs continued Speech Lanaguage Pathology Services SLP Visit Diagnosis: Aphasia (R47.01)    Recommendations for follow up therapy are one component of a multi-disciplinary discharge planning process, led by the attending physician.  Recommendations may be updated based on patient status, additional functional criteria and insurance authorization.    Follow Up Recommendations  Skilled nursing-short term rehab (<3 hours/day) (May be an IPR candidate if level of engagement improves)    Assistance Recommended at Discharge  Frequent or constant Supervision/Assistance  Functional Status Assessment Patient has had a recent decline in their functional status and demonstrates the ability to make significant improvements in function in a reasonable and predictable amount of time.  Frequency and Duration min 2x/week  2 weeks      SLP Evaluation Cognition  Overall Cognitive Status: Difficult to assess       Comprehension  Auditory Comprehension Overall Auditory Comprehension: Impaired Yes/No Questions: Impaired Basic Biographical Questions: 26-50% accurate Basic Immediate Environment Questions: 25-49% accurate    Expression Expression Primary Mode of Expression: Verbal Verbal Expression Overall Verbal Expression: Impaired Automatic Speech:  (None) Repetition: Impaired Level of Impairment: Word level Naming: Impairment Confrontation: Impaired Written Expression Written Expression: Not tested   Oral / Motor               Karnell Vanderloop E Giovonnie Trettel 08/28/2023, 10:48 AM

## 2023-08-28 NOTE — Plan of Care (Signed)
  Problem: Education: Goal: Knowledge of disease or condition will improve Outcome: Progressing Goal: Knowledge of secondary prevention will improve (MUST DOCUMENT ALL) Outcome: Progressing Goal: Knowledge of patient specific risk factors will improve (DELETE if not current risk factor) Outcome: Progressing

## 2023-08-28 NOTE — TOC Initial Note (Signed)
Transition of Care [facility]) - Initial/Assessment Note    Patient Details  Name: [name] Page MRN: [id number] Date of Birth: 11-25-1930  [facility] of [facility] CM/SW Contact:    Baldemar Lenis, LCSW Phone Number: 08/28/2023, 3:48 PM  Clinical Narrative:    Patient from [facility] IDL with spouse, plan is for SNF at discharge. CSW spoke with [facility], they will have a bed for patient available on Monday. Patient will need insurance authorization started once medically stable. CSW to follow.           Expected Discharge Plan: Skilled Nursing Facility Barriers to Discharge: Continued Medical Work up, English as a second language teacher   Patient Goals and CMS Choice Patient states their goals for this hospitalization and ongoing recovery are:: patient unable to participate in goal setting, not fully oriented CMS Medicare.gov Compare Post Acute Care list provided to:: Patient Represenative (must comment) Choice offered to / list presented to : Spouse [employer] ownership interest in [facility].provided to:: Spouse    Expected Discharge Plan and Services     Post Acute Care Choice: Skilled Nursing Facility Living arrangements for the past 2 months: Independent Living Facility                                      Prior Living Arrangements/Services Living arrangements for the past 2 months: Independent Living Facility Lives with:: Spouse Patient language and need for interpreter reviewed:: No Do you feel safe going back to the place where you live?: Yes      Need for Family Participation in Patient Care: Yes (Comment) Care giver support system in place?: No (comment)   Criminal Activity/Legal Involvement Pertinent to Current Situation/Hospitalization: No - Comment as needed  Activities of Daily Living   ADL Screening (condition at time of admission) Independently performs ADLs?: No Does the patient have a NEW difficulty with  bathing/dressing/toileting/self-feeding that is expected to last >3 days?: No (needs assist) Does the patient have a NEW difficulty with getting in/out of bed, walking, or climbing stairs that is expected to last >3 days?: No (needs assist) Does the patient have a NEW difficulty with communication that is expected to last >3 days?: No Is the patient deaf or have difficulty hearing?: Yes Does the patient have difficulty seeing, even when wearing glasses/contacts?: No Does the patient have difficulty concentrating, remembering, or making decisions?: No  Permission Sought/Granted Permission sought to share information with : Facility Medical sales representative, Family Supports Permission granted to share information with : Yes, Verbal Permission Granted  Share Information with NAME: [name]  Permission granted to share info w AGENCY: [employer], [employer] granted to share info w Relationship: Spouse     Emotional Assessment Appearance:: Appears stated age Attitude/Demeanor/Rapport: Unable to Assess Affect (typically observed): Unable to Assess Orientation: : Oriented to Self Alcohol / Substance Use: Not Applicable Psych Involvement: No (comment)  Admission diagnosis:  SDH (subdural hematoma) (HCC) [S06.5XAA] Fall, initial encounter [id number]] Patient Active Problem List   Diagnosis Date Noted   SDH (subdural hematoma) (HCC) 08/26/2023   H/O total shoulder replacement, left 12/05/2022   Vitamin B12 deficiency 11/22/2021   Anxiety 11/01/2021   Chronic idiopathic constipation 11/01/2021   DVT (deep venous thrombosis) (HCC) 11/01/2021   Insomnia 11/01/2021   RBBB 11/01/2021   PONV (postoperative nausea and vomiting) 11/01/2021   Pathological fracture of vertebra 11/01/2021   Skin lesion 11/01/2021  Multiple myeloma (HCC) 10/08/2021   Difficulty in walking, not elsewhere classified 06/27/2021   Gastroesophageal reflux disease 06/27/2021   Generalized anxiety disorder 06/27/2021    Hypothyroidism, unspecified 06/27/2021   Long term (current) use of systemic steroids 06/27/2021   Muscle weakness (generalized) 06/27/2021   Other acute postprocedural pain 06/27/2021   Other chronic allergic conjunctivitis 06/27/2021   Personal history of other venous thrombosis and embolism 06/27/2021   Other specified disorders of bone density and structure, unspecified site 06/27/2021   Other muscle spasm 06/27/2021   Tear of skin of right wrist 02/12/2021   Degeneration of lumbar intervertebral disc 01/17/2020   Hardening of the aorta (main artery of the heart) (HCC) 09/22/2019   Vasovagal syncope 09/20/2019   Migraine without aura, not refractory 03/14/2019   Shoulder joint pain 10/15/2017   Jaw pain 06/12/2017   Alopecia 10/28/2016   Primary localized osteoarthritis of right hip 08/26/2016   Primary osteoarthritis of right hip 08/26/2016   Pleuritic pain 12/17/2015   Non-thrombocytopenic purpura (HCC) 09/24/2015   Osteopenia 05/23/2015   Cystocele 05/23/2015   Rash and nonspecific skin eruption 01/02/2015   Visual field defect nasal step 01/02/2015   Encounter for therapeutic drug monitoring 10/03/2014   Chronic cough 07/12/2014   Nasal itching 01/30/2014   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   Sarcoidosis 01/30/2014   Bronchial stenosis, left 07/20/2013   Cystocele, midline 05/02/2013   Chest pain    History of recurrent UTIs 09/24/2011   Vaginal atrophy 09/24/2011   Orbital disorder 06/05/2011   Orbital lesion 06/05/2011   HYPERLIPIDEMIA 07/30/2010   Essential hypertension 07/30/2010   DEEP VEIN THROMBOSIS/PHLEBITIS 07/30/2010   PULMONARY COLLAPSE 07/30/2010   PULMONARY NODULE 07/30/2010   COUGH 07/30/2010   Osteoarthritis 07/02/2009   PCP:  Geoffry Paradise, MD Pharmacy:   [facility] [place], [place] - [address] [place] [address] Phone: [phone] Fax: [phone]  [facility] [place] [facility]  Pharmacy [address], [address] [place] [address] Phone: [phone] Fax: [phone]  [facility] - [place], [place] - [address] [place] [address] Phone: [phone] Fax: [phone]     Social Drivers of Health (SDOH) Social History: SDOH Screenings   Food Insecurity: Patient Unable To Answer (08/27/2023)  Housing: Patient Unable To Answer (08/27/2023)  Transportation Needs: Patient Unable To Answer (08/27/2023)  Utilities: Patient Unable To Answer (08/27/2023)  Social Connections: Unknown (08/27/2023)  Tobacco Use: Low Risk  (08/26/2023)   SDOH Interventions:     Readmission Risk Interventions     No data to display

## 2023-08-28 NOTE — NC FL2 (Signed)
[place] MEDICAID FL2 LEVEL OF CARE FORM     IDENTIFICATION  Patient Name: [name] Page Birthdate: [birth year] Sex: female Admission Date (Current Location): 08/26/2023  [facility] and [place] Number:  Chiropodist and Address:  [facility] [place], [address] [place], [place] [address]      Provider Number: [id number]  Attending Physician Name and Address:  Dorcas Carrow, MD  Relative Name and Phone Number:       Current Level of Care: Hospital Recommended Level of Care: Skilled Nursing Facility Prior Approval Number:    Date Approved/Denied:   PASRR Number: [id number]  Discharge Plan: SNF    Current Diagnoses: Patient Active Problem List   Diagnosis Date Noted   SDH (subdural hematoma) (HCC) 08/26/2023   H/O total shoulder replacement, left 12/05/2022   Vitamin B12 deficiency 11/22/2021   Anxiety 11/01/2021   Chronic idiopathic constipation 11/01/2021   DVT (deep venous thrombosis) (HCC) 11/01/2021   Insomnia 11/01/2021   RBBB 11/01/2021   PONV (postoperative nausea and vomiting) 11/01/2021   Pathological fracture of vertebra 11/01/2021   Skin lesion 11/01/2021   Multiple myeloma (HCC) 10/08/2021   Difficulty in walking, not elsewhere classified 06/27/2021   Gastroesophageal reflux disease 06/27/2021   Generalized anxiety disorder 06/27/2021   Hypothyroidism, unspecified 06/27/2021   Long term (current) use of systemic steroids 06/27/2021   Muscle weakness (generalized) 06/27/2021   Other acute postprocedural pain 06/27/2021   Other chronic allergic conjunctivitis 06/27/2021   Personal history of other venous thrombosis and embolism 06/27/2021   Other specified disorders of bone density and structure, unspecified site 06/27/2021   Other muscle spasm 06/27/2021   Tear of skin of right wrist 02/12/2021   Degeneration of lumbar intervertebral disc 01/17/2020   Hardening of the aorta (main artery of the heart) (HCC) 09/22/2019    Vasovagal syncope 09/20/2019   Migraine without aura, not refractory 03/14/2019   Shoulder joint pain 10/15/2017   Jaw pain 06/12/2017   Alopecia 10/28/2016   Primary localized osteoarthritis of right hip 08/26/2016   Primary osteoarthritis of right hip 08/26/2016   Pleuritic pain 12/17/2015   Non-thrombocytopenic purpura (HCC) 09/24/2015   Osteopenia 05/23/2015   Cystocele 05/23/2015   Rash and nonspecific skin eruption 01/02/2015   Visual field defect nasal step 01/02/2015   Encounter for therapeutic drug monitoring 10/03/2014   Chronic cough 07/12/2014   Nasal itching 01/30/2014   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   Sarcoidosis 01/30/2014   Bronchial stenosis, left 07/20/2013   Cystocele, midline 05/02/2013   Chest pain    History of recurrent UTIs 09/24/2011   Vaginal atrophy 09/24/2011   Orbital disorder 06/05/2011   Orbital lesion 06/05/2011   HYPERLIPIDEMIA 07/30/2010   Essential hypertension 07/30/2010   DEEP VEIN THROMBOSIS/PHLEBITIS 07/30/2010   PULMONARY COLLAPSE 07/30/2010   PULMONARY NODULE 07/30/2010   COUGH 07/30/2010   Osteoarthritis 07/02/2009    Orientation RESPIRATION BLADDER Height & Weight     Self  Normal Incontinent Weight: 110 lb 3.7 oz (50 kg) Height:  4\' 11"  (149.9 cm)  BEHAVIORAL SYMPTOMS/MOOD NEUROLOGICAL BOWEL NUTRITION STATUS      Incontinent Diet (Heart Fluid)  AMBULATORY STATUS COMMUNICATION OF NEEDS Skin   Limited Assist Verbally Normal                       Personal Care Assistance Level of Assistance  Bathing, Feeding, Dressing Bathing Assistance: Maximum assistance Feeding assistance: Limited assistance Dressing Assistance: Limited assistance  Functional Limitations Info             SPECIAL CARE FACTORS FREQUENCY  PT (By licensed PT), OT (By licensed OT)     PT Frequency: 5x/week OT Frequency: 5x/week            Contractures      Additional Factors Info  Code Status, Allergies Code Status Info:  DNR Allergies Info: NKA           Current Medications (08/28/2023):  This is the current hospital active medication list Current Facility-Administered Medications  Medication Dose Route Frequency Provider Last Rate Last Admin    stroke: early stages of recovery book   Does not apply Once Julian Reil, Jared M, DO       acetaminophen (TYLENOL) tablet 650 mg  650 mg Oral Q4H PRN Hillary Bow, DO       Or   acetaminophen (TYLENOL) 160 MG/5ML solution 650 mg  650 mg Per Tube Q4H PRN Hillary Bow, DO       Or   acetaminophen (TYLENOL) suppository 650 mg  650 mg Rectal Q4H PRN Hillary Bow, DO       acyclovir (ZOVIRAX) tablet 400 mg  400 mg Oral BID Lyda Perone M, DO   400 mg at 08/28/23 0945   amLODipine (NORVASC) tablet 10 mg  10 mg Oral Daily Lyda Perone M, DO   10 mg at 08/28/23 0945   gemfibrozil (LOPID) tablet 600 mg  600 mg Oral BID AC Lyda Perone M, DO   600 mg at 08/28/23 0945   levothyroxine (SYNTHROID) tablet 50 mcg  50 mcg Oral Q0600 Hillary Bow, DO   50 mcg at 08/28/23 1610   loperamide (IMODIUM) capsule 2 mg  2 mg Oral Q4H PRN Dorcas Carrow, MD   2 mg at 08/28/23 1159   LORazepam (ATIVAN) tablet 0.5 mg  0.5 mg Oral QHS PRN Hillary Bow, DO   0.5 mg at 08/27/23 2348   losartan (COZAAR) tablet 50 mg  50 mg Oral Daily Lyda Perone M, DO   50 mg at 08/28/23 9604   nitrofurantoin (MACRODANTIN) capsule 100 mg  100 mg Oral Daily Lyda Perone M, DO   100 mg at 08/28/23 5409   oxyCODONE (Oxy IR/ROXICODONE) immediate release tablet 5 mg  5 mg Oral Q6H PRN Hillary Bow, DO       pantoprazole (PROTONIX) injection 40 mg  40 mg Intravenous QHS Lyda Perone M, DO   40 mg at 08/27/23 2108   potassium chloride SA (KLOR-CON M) CR tablet 20 mEq  20 mEq Oral TID Dorcas Carrow, MD       potassium PHOSPHATE 20 mmol in dextrose 5 % 500 mL infusion  20 mmol Intravenous Once Dorcas Carrow, MD 84 mL/hr at 08/28/23 1438 20 mmol at 08/28/23 1438   pregabalin (LYRICA)  capsule 25 mg  25 mg Oral BID Hillary Bow, DO   25 mg at 08/28/23 0945   QUEtiapine (SEROQUEL) tablet 12.5 mg  12.5 mg Oral BID Dorcas Carrow, MD   12.5 mg at 08/28/23 1111   senna-docusate (Senokot-S) tablet 1 tablet  1 tablet Oral BID Hillary Bow, DO   1 tablet at 08/27/23 2106     Discharge Medications: Please see discharge summary for a list of discharge medications.  Relevant Imaging Results:  Relevant Lab Results:   Additional Information SSN: [id number]  Collette Pescador Felipa Emory, Student-Social Work

## 2023-08-28 NOTE — Progress Notes (Signed)
PROGRESS NOTE    [name] Page  ZHY:[id number] DOB: 1931-07-09 DOA: 08/26/2023 PCP: Geoffry Paradise, [name] Page    Brief Narrative:  89 year old with history of DVT not on anticoagulation, hypertension, hypothyroidism, chronic pain medications, GERD who is living at an independent living apartment at [facility] presented with unwitnessed fall, aphasia and right-sided weakness. She has multiple falls over last year.  Patient also has painful conditions due to multiple myeloma.  Husband came home to find her on the floor.  Called EMS.  Patient was aphasic on arrival.  Blood pressure was 180/90.  A-fib on the monitor.  CT head with large acute left SDH with 5 mm left-to-right shift.  Not on anticoagulation.  Admitted with neurosurgery and neurology consultation.  Recommended conservative management.  Subjective: Patient seen and examined.  Remains profoundly dysarthric.  Restless and was trying to climb out of the bed.  Participating with therapies but needed a lot of support. See goal of care discussion below.   Assessment & Plan:   Acute left subdural hematoma with midline shift, aphasia. Seen by neurosurgery and neurology.  Recommended against emergent surgical evacuation of hematoma by neurosurgery. Continue to monitor in the hospital.  Blood pressure goal less than 150.  Resume home blood pressure medications. Mobilize with PT OT.  Refer to SNF at [facility] before going back to independent living. Continue to attempt to work with PT OT.  Hypokalemia: Significant with potassium 2.8 on presentation.  Persistently low.  Will replace with potassium supplementation.  Hypophosphatemia: Will replace.  Chronic medical issues Essential hypertension, blood pressure stable today.  Resume all home medications. Multiple myeloma, chronic pain syndrome, on oxycodone and Lyrica.  Continue. Hypothyroidism, on Synthroid.  Continue.  Goal of care: Discussed in detail with patient's husband at the  bedside.  Patient with poor healthy status to start with, she had multiple falls and recently has remained very frail and poor appetite.  We discussed about attempting rehab at [facility] with palliative care follow-up.  We should monitor for symptoms, appropriateness for rehab before discharging.  Will start patient on Seroquel 12.5 mg twice daily today, monitor oral intake.  Discharge to SNF with palliative care follow-up.  She may need hospice involvement if unable to carry on rehab.  Husband is agreeable.    DVT prophylaxis: SCD's Start: 08/26/23 2018   Code Status: DNR with limited intervention Family Communication: Husband at the bedside Disposition Plan: Status is: Inpatient Remains inpatient appropriate because: Inpatient monitoring needed     Consultants:  Neurology Neurosurgery  Procedures:  None  Antimicrobials:  Macrobid, long-term medication     Objective: Vitals:   08/27/23 2350 08/28/23 0338 08/28/23 0748 08/28/23 1220  BP: (!) 152/65 (!) 169/70 (!) 143/60 (!) 154/54  Pulse: 79 90 87 79  Resp: 18 16 18 18   Temp: 98.6 F (37 C) 98.8 F (37.1 C) 98 F (36.7 C) 97.9 F (36.6 C)  TempSrc: Oral Oral  Oral  SpO2: 98% 100% 100% 100%  Weight:      Height:        Intake/Output Summary (Last 24 hours) at 08/28/2023 1248 Last data filed at 08/28/2023 0500 Gross per 24 hour  Intake 120 ml  Output 750 ml  Net -630 ml   Filed Weights   08/26/23 1700 08/26/23 2035  Weight: 50 kg 50 kg    Examination:  General exam: Appears restless.  Unable to talk.  Difficult to understand.  Respiratory system: No added sounds. Cardiovascular system: S1 &  S2 heard, RRR.  Gastrointestinal system: Soft.  Nontender.  Bowel sound present. Central nervous system: Alert and awake.  She is oriented to herself but difficult to understand words.. Extremities: No swelling or edema. Right side is weaker than left side.    Data Reviewed: I have personally reviewed following labs and  imaging studies  CBC: Recent Labs  Lab 08/26/23 1743 08/26/23 1752 08/28/23 0721  WBC 7.1  --  9.0  NEUTROABS 5.3  --  7.5  HGB 8.3* 9.2* 8.4*  HCT 25.1* 27.0* 23.8*  MCV 94.0  --  89.1  PLT 296  --  342   Basic Metabolic Panel: Recent Labs  Lab 08/26/23 1743 08/26/23 1752 08/28/23 0721  NA 130* 130* 128*  K 2.8* 2.8* 2.8*  CL 99 100 99  CO2 18*  --  18*  GLUCOSE 156* 151* 115*  BUN 11 12 10   CREATININE 0.66 0.50 0.57  CALCIUM 8.8*  --  8.7*  MG  --   --  1.7  PHOS  --   --  2.0*   GFR: Estimated Creatinine Clearance: 31.9 mL/min (by C-G formula based on SCr of 0.57 mg/dL). Liver Function Tests: Recent Labs  Lab 08/26/23 1743 08/28/23 0721  AST 29 39  ALT 12 16  ALKPHOS 294* 301*  BILITOT 1.0 1.0  PROT 6.6 6.4*  ALBUMIN 3.4* 3.2*   No results for input(s): "LIPASE", "AMYLASE" in the last 168 hours. No results for input(s): "AMMONIA" in the last 168 hours. Coagulation Profile: Recent Labs  Lab 08/26/23 1743  INR 1.1   Cardiac Enzymes: No results for input(s): "CKTOTAL", "CKMB", "CKMBINDEX", "TROPONINI" in the last 168 hours. BNP (last 3 results) No results for input(s): "PROBNP" in the last 8760 hours. HbA1C: No results for input(s): "HGBA1C" in the last 72 hours. CBG: Recent Labs  Lab 08/26/23 1746  GLUCAP 145*   Lipid Profile: No results for input(s): "CHOL", "HDL", "LDLCALC", "TRIG", "CHOLHDL", "LDLDIRECT" in the last 72 hours. Thyroid Function Tests: No results for input(s): "TSH", "T4TOTAL", "FREET4", "T3FREE", "THYROIDAB" in the last 72 hours. Anemia Panel: No results for input(s): "VITAMINB12", "FOLATE", "FERRITIN", "TIBC", "IRON", "RETICCTPCT" in the last 72 hours. Sepsis Labs: No results for input(s): "PROCALCITON", "LATICACIDVEN" in the last 168 hours.  No results found for this or any previous visit (from the past 240 hours).       Radiology Studies: DG Pelvis Portable Result Date: 08/26/2023 CLINICAL DATA:  fall EXAM:  PORTABLE PELVIS 1-2 VIEWS COMPARISON:  CT right hip 07/31/2023 FINDINGS: Limited evaluation due to overlapping osseous structures and overlying soft tissues. Total bilateral hip arthroplasty. Kyphoplasty of the visualized lower lumbar spine. Redemonstration of cortical irregularity along the right superior pubic rami that correlates with question of a than acute now subacute fracture noted on CT right hip 07/31/2023. Question irregularity of the inferior right superior pubic ram no pelvic diastasis. No pelvic bone lesions are seen. Stool within the rectum. IMPRESSION: 1. Recommend CT. 2. Redemonstration of cortical irregularity along the right superior pubic rami that correlates with question of a than acute now subacute fracture noted on CT right hip 07/31/2023. Superimposed acute component not excluded. 3. Question irregularity of the inferior right superior pubic rami- limited evaluation due to overlapping osseous structures and overlying soft tissues. 4. Total bilateral hip arthroplasty. 5. Kyphoplasty of the visualized lower lumbar spine. Electronically Signed   By: Tish Frederickson M.D.   On: 08/26/2023 19:38   CT Cervical Spine Wo Contrast Result Date: 08/26/2023  CLINICAL DATA:  Neck trauma (Age >= 65y) EXAM: CT CERVICAL SPINE WITHOUT CONTRAST TECHNIQUE: Multidetector CT imaging of the cervical spine was performed without intravenous contrast. Multiplanar CT image reconstructions were also generated. RADIATION DOSE REDUCTION: This exam was performed according to the departmental dose-optimization program which includes automated exposure control, adjustment of the mA and/or kV according to patient size and/or use of iterative reconstruction technique. COMPARISON:  CT cervical spine 07/31/2023 FINDINGS: Alignment: Grade 1 anterolisthesis of C2 on C3. Mild retrolisthesis C3 on C4 mild retrolisthesis of C5 on C6. Skull base and vertebrae: Multilevel severe degenerative change of the spine with associated  severe osseous neural foraminal stenosis at the bilateral C3-C4 and right C4-C5 levels. No acute fracture. No aggressive appearing focal osseous lesion or focal pathologic process. Soft tissues and spinal canal: No prevertebral fluid or swelling. No visible canal hematoma. Upper chest: Unremarkable. Other: Atherosclerotic calcifications are present within the cavernous internal carotid and vertebral arteries. IMPRESSION: 1. No acute displaced fracture or traumatic listhesis of the cervical spine. 2. Multilevel severe degenerative change of the spine with associated severe osseous neural foraminal stenosis at the bilateral C3-C4 and right C4-C5 levels. Electronically Signed   By: Tish Frederickson M.D.   On: 08/26/2023 19:34   DG Chest Portable 1 View Result Date: 08/26/2023 CLINICAL DATA:  Fall. EXAM: PORTABLE CHEST 1 VIEW COMPARISON:  July 23, 2021. FINDINGS: Stable cardiomediastinal silhouette. Status post left shoulder arthroplasty. Hypoinflation of the lungs with minimal bibasilar subsegmental atelectasis. Status post kyphoplasty at multiple levels of the thoracic spine. IMPRESSION: Hypoinflation of the lungs with minimal bibasilar subsegmental atelectasis. Electronically Signed   By: Lupita Raider M.D.   On: 08/26/2023 19:15   CT HEAD CODE STROKE WO CONTRAST Result Date: 08/26/2023 CLINICAL DATA:  Code stroke.  Aphasia, right-sided deficit EXAM: CT HEAD WITHOUT CONTRAST TECHNIQUE: Contiguous axial images were obtained from the base of the skull through the vertex without intravenous contrast. RADIATION DOSE REDUCTION: This exam was performed according to the departmental dose-optimization program which includes automated exposure control, adjustment of the mA and/or kV according to patient size and/or use of iterative reconstruction technique. COMPARISON:  07/31/2023 FINDINGS: Brain: Large left cerebral convexity subdural hematoma, which is mixed density, favored to be acute and, which measures up to  2.8 cm (series 6, image 15), with significant mass effect on the underlying left cerebral hemisphere, particularly the left parietal lobe. 5 mm of left-to-right midline shift. Additional subarachnoid hemorrhage in the right temporal sulci (series 6, image 21). No evidence of acute infarct, mass, hydrocephalus, or intraventricular hemorrhage. Vascular: No hyperdense vessel. Skull: Negative for fracture or focal lesion. Sinuses/Orbits: No acute finding. Other: The mastoid air cells are well aerated. IMPRESSION: 1. Large left cerebral convexity subdural hematoma, which is mixed density, favored to be acute, which measures up to 2.8 cm, with mass effect on the underlying left cerebral hemisphere, particularly the left parietal lobe, and 5 mm of left-to-right midline shift. 2. Additional subarachnoid hemorrhage in the right temporal sulci. Imaging results were communicated on 08/26/2023 at 6:00 pm to provider Dr. Amada Jupiter via secure text paging. Electronically Signed   By: Wiliam Ke M.D.   On: 08/26/2023 18:02        Scheduled Meds:   stroke: early stages of recovery book   Does not apply Once   acyclovir  400 mg Oral BID   amLODipine  10 mg Oral Daily   gemfibrozil  600 mg Oral BID AC   levothyroxine  50 mcg Oral Q0600   losartan  50 mg Oral Daily   nitrofurantoin  100 mg Oral Daily   pantoprazole (PROTONIX) IV  40 mg Intravenous QHS   pregabalin  25 mg Oral BID   QUEtiapine  12.5 mg Oral BID   senna-docusate  1 tablet Oral BID   Continuous Infusions:     LOS: 2 days    Time spent: 40 minutes    [name] Carrow, [name] Page [facility]

## 2023-08-28 NOTE — Evaluation (Signed)
Occupational Therapy Evaluation Patient Details Name: [name] Page MRN: [id number] DOB: 1930-10-07 Today's Date: 08/28/2023   History of Present Illness Patient presenting to the ED with difficulty speaking and R sided weakness after being found down by her husband on 08/27/23. CT finding large L  cerebral SDH with midline shift, and SAH in R temporal sulci.  PMH includes:  DVT not on anticoagulation, hypertension, hypothyroidism, chronic pain medications, GERD   Clinical Impression   Prior to this admission, patient living at [facility], and per chart and with multiple falls recently. Patient presenting with confusion, expressive difficulties, and need for increased assist to complete all ADLs and functional mobility. Patient attempting multiple times to get out of bed during eval, but was able to be gently redirected. Of note, patient keeps attempting to get OOB when she is soiled per RN. Patient is currently min-total A for ADL management, min A for bed mobility, and min A for basic mobility (squat pivot to [facility]). OT recommending rehab at her current facilty of lesser intensity < 3 hours when medically appropriate. OT will follow acutely.      If plan is discharge home, recommend the following: Two people to help with walking and/or transfers;A lot of help with bathing/dressing/bathroom;Assistance with cooking/housework;Assistance with feeding;Direct supervision/assist for medications management;Direct supervision/assist for financial management;Assist for transportation;Help with stairs or ramp for entrance;Supervision due to cognitive status    Functional Status Assessment  Patient has had a recent decline in their functional status and demonstrates the ability to make significant improvements in function in a reasonable and predictable amount of time.  Equipment Recommendations  Other (comment) (defer to next venue)    Recommendations for Other Services       Precautions /  Restrictions Precautions Precautions: Other (comment) Precaution Comments: BP < 150 Restrictions Weight Bearing Restrictions Per Provider Order: No      Mobility Bed Mobility Overal bed mobility: Needs Assistance Bed Mobility: Supine to Sit, Sit to Supine     Supine to sit: Min assist Sit to supine: Min assist   General bed mobility comments: min A for safetly, patient impulsive and restless, able to complete long sitting independently in bed    Transfers Overall transfer level: Needs assistance Equipment used: 1 person hand held assist Transfers: Sit to/from Stand Sit to Stand: Min assist           General transfer comment: Min A to come into standing, completing pseudo squat pivot to [facility], incontinent of bladder when in standing      Balance Overall balance assessment: Needs assistance Sitting-balance support: Bilateral upper extremity supported, Feet supported Sitting balance-Leahy Scale: Fair Sitting balance - Comments: could not accept challenge   Standing balance support: Bilateral upper extremity supported, During functional activity, Reliant on assistive device for balance Standing balance-Leahy Scale: Poor Standing balance comment: reliant on external support from OT                           ADL either performed or assessed with clinical judgement   ADL Overall ADL's : Needs assistance/impaired Eating/Feeding: Moderate assistance;Sitting   Grooming: Moderate assistance;Sitting Grooming Details (indicate cue type and reason): gestural cues and need for mod A due to thoroughness Upper Body Bathing: Moderate assistance;Sitting   Lower Body Bathing: Total assistance;Sitting/lateral leans;Sit to/from stand   Upper Body Dressing : Moderate assistance;Sitting   Lower Body Dressing: Total assistance;Sitting/lateral leans;Sit to/from stand   Toilet Transfer: Minimal assistance;Squat-pivot;BSC/3in1 Toilet  Transfer Details (indicate cue type and  reason): simulated with incremental squat from EOB Toileting- Clothing Manipulation and Hygiene: Total assistance;Bed level       Functional mobility during ADLs: Maximal assistance;+2 for physical assistance;+2 for safety/equipment;Cueing for safety;Cueing for sequencing General ADL Comments: Patient presenting with confusion, expressive difficulties, and need for increased assist to complete all ADLs and functional mobility. Patient attempting multiple times to get out of bed during eval, but was able to be gently redirected. Of note, patient keeps attempting to get OOB when she is soiled per RN. Patient is currently min-total A for ADL management, min A for bed mobility, and min A for basic mobility (squat pivot to [facility]). OT recommending rehab at her current facilty of lesser intensity < 3 hours when medically appropriate. OT will follow acutely.     Vision   Vision Assessment?: No apparent visual deficits Additional Comments: OT will continue to assess when more cognitively appropriate     Perception Perception: Not tested       Praxis Praxis: Not tested       Pertinent Vitals/Pain Pain Assessment Pain Assessment: Faces Faces Pain Scale: No hurt Pain Intervention(s): Limited activity within patient's tolerance, Monitored during session, Repositioned     Extremity/Trunk Assessment Upper Extremity Assessment Upper Extremity Assessment: Generalized weakness;Difficult to assess due to impaired cognition (Patient able to squeeze hands, unable to follow anything further)   Lower Extremity Assessment Lower Extremity Assessment: Defer to PT evaluation   Cervical / Trunk Assessment Cervical / Trunk Assessment: Normal   Communication Communication Communication: Difficulty following commands/understanding;Difficulty communicating thoughts/reduced clarity of speech Following commands: Follows one step commands with increased time Cueing Techniques: Verbal cues;Gestural cues;Tactile  cues;Visual cues   Cognition Arousal: Alert Behavior During Therapy: Restless, Anxious, Impulsive Overall Cognitive Status: Impaired/Different from baseline Area of Impairment: Attention, Orientation, Memory, Following commands, Safety/judgement, Awareness, Problem solving                 Orientation Level: Time, Place, Situation Current Attention Level: Focused Memory: Decreased recall of precautions, Decreased short-term memory Following Commands: Follows one step commands with increased time Safety/Judgement: Decreased awareness of deficits, Decreased awareness of safety Awareness: Intellectual Problem Solving: Difficulty sequencing, Requires verbal cues, Requires tactile cues General Comments: Patient with aphasic presentation, however when in a calm state, is able to follow commands. Patient with frequent attempts to get OOB, however was soiled and per RN was engaging in the same behavior when soiled earlier in shift     General Comments       Exercises     Shoulder Instructions      Home Living Family/patient expects to be discharged to:: Other (Comment) (Independent living) Living Arrangements: Spouse/significant other ([place] with spouse)   Type of Home: Independent living facility                              Lives With: Spouse    Prior Functioning/Environment Prior Level of Function : Needs assist             Mobility Comments: per chart has had multiple falls, patient currently unable to state ADLs Comments: per chart spouse assists with ADLs, appears to have declined over the last few months with regards to overall independence        OT Problem List: Decreased strength;Decreased range of motion;Decreased activity tolerance;Impaired balance (sitting and/or standing);Impaired vision/perception;Decreased coordination;Decreased cognition;Decreased safety awareness;Decreased knowledge of use of DME  or AE;Decreased knowledge of  precautions      OT Treatment/Interventions: Self-care/ADL training;Therapeutic exercise;Neuromuscular education;Energy conservation;DME and/or AE instruction;Manual therapy;Modalities;Therapeutic activities;Cognitive remediation/compensation;Patient/family education;Balance training    OT Goals(Current goals can be found in the care plan section) Acute Rehab OT Goals Patient Stated Goal: unable OT Goal Formulation: Patient unable to participate in goal setting Time For Goal Achievement: 09/11/23 Potential to Achieve Goals: Fair  OT Frequency: Min 1X/week    Co-evaluation              AM-PAC OT "6 Clicks" Daily Activity     Outcome Measure Help from another person eating meals?: A Lot Help from another person taking care of personal grooming?: A Lot Help from another person toileting, which includes using toliet, bedpan, or urinal?: A Lot Help from another person bathing (including washing, rinsing, drying)?: A Lot Help from another person to put on and taking off regular upper body clothing?: A Lot Help from another person to put on and taking off regular lower body clothing?: Total 6 Click Score: 11   End of Session Nurse Communication: Mobility status  Activity Tolerance: Patient limited by fatigue;Patient limited by lethargy Patient left: in bed;with call bell/phone within reach;with bed alarm set  OT Visit Diagnosis: Unsteadiness on feet (R26.81);Other abnormalities of gait and mobility (R26.89);Muscle weakness (generalized) (M62.81);Repeated falls (R29.6);History of falling (Z91.81);Other symptoms and signs involving cognitive function;Cognitive communication deficit (R41.841) Symptoms and signs involving cognitive functions: Nontraumatic SAH;Other cerebrovascular disease                Time: 1610-9604 OT Time Calculation (min): 16 min Charges:  OT General Charges $OT Visit: 1 Visit OT Evaluation $OT Eval Moderate Complexity: 1 Mod  Pollyann Glen E. Evanell Redlich, OTR/L Acute  Rehabilitation Services [phone]   Cherlyn Cushing 08/28/2023, 11:23 AM

## 2023-08-29 DIAGNOSIS — S065XAA Traumatic subdural hemorrhage with loss of consciousness status unknown, initial encounter: Secondary | ICD-10-CM | POA: Diagnosis not present

## 2023-08-29 LAB — PHOSPHORUS: Phosphorus: 2.8 mg/dL (ref 2.5–4.6)

## 2023-08-29 MED ORDER — PANTOPRAZOLE SODIUM 40 MG PO TBEC
40.0000 mg | DELAYED_RELEASE_TABLET | Freq: Every day | ORAL | Status: DC
  Administered 2023-08-29: 40 mg via ORAL
  Filled 2023-08-29: qty 1

## 2023-08-29 NOTE — Progress Notes (Signed)
PROGRESS NOTE    SHERILYN HAUSWIRTH  [name]:[id number] DOB: 09-Aug-1931 DOA: 08/26/2023 PCP: Geoffry Paradise, MD    Brief Narrative:  89 year old with history of DVT not on anticoagulation, hypertension, hypothyroidism, chronic pain medications, GERD who is living at an independent living apartment at [facility] presented with unwitnessed fall, aphasia and right-sided weakness. She has multiple falls over last year.  Patient also has painful conditions due to multiple myeloma.  Husband came home to find her on the floor.  Called EMS.  Patient was aphasic on arrival.  Blood pressure was 180/90.  A-fib on the monitor.  CT head with large acute left SDH with 5 mm left-to-right shift.  Not on anticoagulation.  Admitted with neurosurgery and neurology consultation.  Recommended conservative management.  Subjective:  Patient seen and examined.  Remained quiet but unable to understand.  She was not able to follow commands or interact.  She was likely trying to say something.  Today not as restless.   Assessment & Plan:   Acute left subdural hematoma with midline shift, aphasia. Seen by neurosurgery and neurology.  Recommended against emergent surgical evacuation of hematoma by neurosurgery. Resume home blood pressure medications. Mobilize with PT OT.  Refer to SNF at [facility] before going back to independent living. Continue to attempt to work with PT OT.  Hypokalemia: Replaced aggressively..  Hypophosphatemia: Will replace.  Will keep on schedule placement.  Chronic medical issues Essential hypertension, blood pressure stable today.  Resume all home medications. Multiple myeloma, chronic pain syndrome, on oxycodone and Lyrica.  Continue. Hypothyroidism, on Synthroid.  Continue.  Goal of care: 1/17, discussed in detail with patient's husband at the bedside.  Patient with poor healthy status to start with, she had multiple falls and recently has remained very frail and poor appetite.  We  discussed about attempting rehab at [facility] with palliative care follow-up.  We should monitor for symptoms, appropriateness for rehab before discharging.  Started on Seroquel 12.5 mg twice daily.  Monitor oral intake.  When stable, will discharge to SNF with palliative care follow-up.  Try adequate pain medications for pain relief.   DVT prophylaxis: SCD's Start: 08/26/23 2018   Code Status: DNR with limited intervention Family Communication: None today. Disposition Plan: Status is: Inpatient Remains inpatient appropriate because: Inpatient monitoring needed     Consultants:  Neurology Neurosurgery  Procedures:  None  Antimicrobials:  Macrobid, long-term medication     Objective: Vitals:   08/28/23 2013 08/28/23 2336 08/29/23 0320 08/29/23 0804  BP: (!) 170/66 132/67 (!) 111/58 (!) 154/63  Pulse: 98 87 (!) 47 77  Resp: 18 18 18 18   Temp: 98.6 F (37 C) 100.2 F (37.9 C) 97.6 F (36.4 C) 98.7 F (37.1 C)  TempSrc: Oral Oral Oral Oral  SpO2: 97% 99% 100% 100%  Weight:      Height:        Intake/Output Summary (Last 24 hours) at 08/29/2023 1102 Last data filed at 08/29/2023 0900 Gross per 24 hour  Intake 50 ml  Output --  Net 50 ml   Filed Weights   08/26/23 1700 08/26/23 2035  Weight: 50 kg 50 kg    Examination:  General exam: Appears confused.  Difficult to express.  Unintelligible speech.  Laying comfortably in the bed today. Multiple ecchymotic areas on the shoulder and neck. Respiratory system: No added sounds. Cardiovascular system: S1 & S2 heard, RRR.  Gastrointestinal system: Soft.  Nontender.  Bowel sound present. Central nervous system: Alert and  awake.  Not oriented.   Extremities: No swelling or edema. Right side is weaker than left side.    Data Reviewed: I have personally reviewed following labs and imaging studies  CBC: Recent Labs  Lab 08/26/23 1743 08/26/23 1752 08/28/23 0721  WBC 7.1  --  9.0  NEUTROABS 5.3  --  7.5  HGB 8.3*  9.2* 8.4*  HCT 25.1* 27.0* 23.8*  MCV 94.0  --  89.1  PLT 296  --  342   Basic Metabolic Panel: Recent Labs  Lab 08/26/23 1743 08/26/23 1752 08/28/23 0721  NA 130* 130* 128*  K 2.8* 2.8* 2.8*  CL 99 100 99  CO2 18*  --  18*  GLUCOSE 156* 151* 115*  BUN 11 12 10   CREATININE 0.66 0.50 0.57  CALCIUM 8.8*  --  8.7*  MG  --   --  1.7  PHOS  --   --  2.0*   GFR: Estimated Creatinine Clearance: 31.9 mL/min (by C-G formula based on SCr of 0.57 mg/dL). Liver Function Tests: Recent Labs  Lab 08/26/23 1743 08/28/23 0721  AST 29 39  ALT 12 16  ALKPHOS 294* 301*  BILITOT 1.0 1.0  PROT 6.6 6.4*  ALBUMIN 3.4* 3.2*   No results for input(s): "LIPASE", "AMYLASE" in the last 168 hours. No results for input(s): "AMMONIA" in the last 168 hours. Coagulation Profile: Recent Labs  Lab 08/26/23 1743  INR 1.1   Cardiac Enzymes: No results for input(s): "CKTOTAL", "CKMB", "CKMBINDEX", "TROPONINI" in the last 168 hours. BNP (last 3 results) No results for input(s): "PROBNP" in the last 8760 hours. HbA1C: No results for input(s): "HGBA1C" in the last 72 hours. CBG: Recent Labs  Lab 08/26/23 1746  GLUCAP 145*   Lipid Profile: No results for input(s): "CHOL", "HDL", "LDLCALC", "TRIG", "CHOLHDL", "LDLDIRECT" in the last 72 hours. Thyroid Function Tests: No results for input(s): "TSH", "T4TOTAL", "FREET4", "T3FREE", "THYROIDAB" in the last 72 hours. Anemia Panel: No results for input(s): "VITAMINB12", "FOLATE", "FERRITIN", "TIBC", "IRON", "RETICCTPCT" in the last 72 hours. Sepsis Labs: No results for input(s): "PROCALCITON", "LATICACIDVEN" in the last 168 hours.  No results found for this or any previous visit (from the past 240 hours).       Radiology Studies: No results found.       Scheduled Meds:   stroke: early stages of recovery book   Does not apply Once   acyclovir  400 mg Oral BID   amLODipine  10 mg Oral Daily   gemfibrozil  600 mg Oral BID AC    levothyroxine  50 mcg Oral Q0600   losartan  50 mg Oral Daily   nitrofurantoin  100 mg Oral Daily   pantoprazole (PROTONIX) IV  40 mg Intravenous QHS   potassium chloride  20 mEq Oral TID   pregabalin  25 mg Oral BID   QUEtiapine  12.5 mg Oral BID   senna-docusate  1 tablet Oral BID   Continuous Infusions:     LOS: 3 days    Time spent: 40 minutes    Dorcas Carrow, MD [facility]

## 2023-08-30 DIAGNOSIS — S065XAA Traumatic subdural hemorrhage with loss of consciousness status unknown, initial encounter: Secondary | ICD-10-CM | POA: Diagnosis not present

## 2023-08-30 LAB — COMPREHENSIVE METABOLIC PANEL
ALT: 20 U/L (ref 0–44)
AST: 29 U/L (ref 15–41)
Albumin: 3 g/dL — ABNORMAL LOW (ref 3.5–5.0)
Alkaline Phosphatase: 226 U/L — ABNORMAL HIGH (ref 38–126)
Anion gap: 13 (ref 5–15)
BUN: 20 mg/dL (ref 8–23)
CO2: 15 mmol/L — ABNORMAL LOW (ref 22–32)
Calcium: 8.8 mg/dL — ABNORMAL LOW (ref 8.9–10.3)
Chloride: 104 mmol/L (ref 98–111)
Creatinine, Ser: 0.7 mg/dL (ref 0.44–1.00)
GFR, Estimated: >60 mL/min (ref >60)
Glucose, Bld: 111 mg/dL — ABNORMAL HIGH (ref 70–99)
Potassium: 3.5 mmol/L (ref 3.5–5.1)
Sodium: 132 mmol/L — ABNORMAL LOW (ref 135–145)
Total Bilirubin: 0.8 mg/dL (ref 0.0–1.2)
Total Protein: 6.5 g/dL (ref 6.5–8.1)

## 2023-08-30 LAB — CBC WITH DIFFERENTIAL/PLATELET
Abs Immature Granulocytes: 0.04 10*3/uL (ref 0.00–0.07)
Basophils Absolute: 0 10*3/uL (ref 0.0–0.1)
Basophils Relative: 0 %
Eosinophils Absolute: 0.1 10*3/uL (ref 0.0–0.5)
Eosinophils Relative: 1 %
HCT: 23 % — ABNORMAL LOW (ref 36.0–46.0)
Hemoglobin: 7.9 g/dL — ABNORMAL LOW (ref 12.0–15.0)
Immature Granulocytes: 1 %
Lymphocytes Relative: 10 %
Lymphs Abs: 0.8 10*3/uL (ref 0.7–4.0)
MCH: 31.5 pg (ref 26.0–34.0)
MCHC: 34.3 g/dL (ref 30.0–36.0)
MCV: 91.6 fL (ref 80.0–100.0)
Monocytes Absolute: 1 10*3/uL (ref 0.1–1.0)
Monocytes Relative: 11 %
Neutro Abs: 6.7 10*3/uL (ref 1.7–7.7)
Neutrophils Relative %: 77 %
Platelets: 325 10*3/uL (ref 150–400)
RBC: 2.51 MIL/uL — ABNORMAL LOW (ref 3.87–5.11)
RDW: 16.3 % — ABNORMAL HIGH (ref 11.5–15.5)
WBC: 8.7 10*3/uL (ref 4.0–10.5)
nRBC: 0 % (ref 0.0–0.2)

## 2023-08-30 LAB — MAGNESIUM: Magnesium: 1.9 mg/dL (ref 1.7–2.4)

## 2023-08-30 MED ORDER — LORAZEPAM 2 MG/ML PO CONC: 1.0000 mg | ORAL | Status: DC | PRN

## 2023-08-30 MED ORDER — ONDANSETRON HCL 4 MG/2ML IJ SOLN: 4.0000 mg | Freq: Four times a day (QID) | INTRAMUSCULAR | Status: DC | PRN

## 2023-08-30 MED ORDER — LORAZEPAM 2 MG/ML IJ SOLN: 1.0000 mg | INTRAMUSCULAR | Status: DC | PRN

## 2023-08-30 MED ORDER — HALOPERIDOL LACTATE 2 MG/ML PO CONC: 0.5000 mg | ORAL | Status: DC | PRN

## 2023-08-30 MED ORDER — GLYCOPYRROLATE 0.2 MG/ML IJ SOLN: 0.2000 mg | INTRAMUSCULAR | Status: DC | PRN

## 2023-08-30 MED ORDER — MORPHINE SULFATE (CONCENTRATE) 10 MG /0.5 ML PO SOLN: 5.0000 mg | ORAL | Status: DC | PRN

## 2023-08-30 MED ORDER — HALOPERIDOL 0.5 MG PO TABS: 0.5000 mg | ORAL_TABLET | ORAL | Status: DC | PRN

## 2023-08-30 MED ORDER — ACETAMINOPHEN 325 MG PO TABS: 650.0000 mg | ORAL_TABLET | Freq: Four times a day (QID) | ORAL | Status: DC | PRN

## 2023-08-30 MED ORDER — MORPHINE SULFATE (PF) 2 MG/ML IV SOLN: 1.0000 mg | INTRAVENOUS | Status: DC | PRN

## 2023-08-30 MED ORDER — LORAZEPAM 1 MG PO TABS
1.0000 mg | ORAL_TABLET | ORAL | Status: DC | PRN
  Administered 2023-08-31: 1 mg via ORAL
  Filled 2023-08-30: qty 1

## 2023-08-30 MED ORDER — ACETAMINOPHEN 650 MG RE SUPP: 650.0000 mg | Freq: Four times a day (QID) | RECTAL | Status: DC | PRN

## 2023-08-30 MED ORDER — OCTREOTIDE ACETATE 100 MCG/ML IJ SOLN: 100.0000 ug | Freq: Three times a day (TID) | INTRAMUSCULAR | Status: DC | PRN

## 2023-08-30 MED ORDER — GLYCOPYRROLATE 1 MG PO TABS: 1.0000 mg | ORAL_TABLET | ORAL | Status: DC | PRN

## 2023-08-30 MED ORDER — HALOPERIDOL LACTATE 5 MG/ML IJ SOLN: 0.5000 mg | INTRAMUSCULAR | Status: DC | PRN

## 2023-08-30 MED ORDER — ONDANSETRON 4 MG PO TBDP: 4.0000 mg | ORAL_TABLET | Freq: Four times a day (QID) | ORAL | Status: DC | PRN

## 2023-08-30 NOTE — IPAL (Signed)
  Interdisciplinary Goals of Care Family Meeting   Date carried out: 08/30/2023  Location of the meeting: Bedside  Member's involved: Physician, Bedside Registered Nurse, and Family Member or next of kin  Durable Power of Attorney or acting medical decision maker: [name] Page , Husband     Discussion: We discussed goals of care for [name] Page .  Since patient admitted with large subdural hematoma with midline shift, patient has been progressively declining to the point that she has not been able to interact at least since last 24 hours.  She has not been recognizing her husband also.  Patient has remained lethargic, not eating for at least last 2 days.  She is also unable to participate in any therapies or oral intake. Patient does have extensive medical issues including hypertension, hypothyroidism, she does have history of multiple myeloma and previous multiple falls.  She does have chronic pain syndrome.  She has been failing to thrive for at least 6 months now.  We discussed her overall condition, nearing end-of-life situation.  Her husband appropriately brought up conversation about hospice possibly at [facility]. Patient is not doing very well, she is not stable to transfer today.  Will convert her to comfort care and hospice and provide all comfort care measures today in the hospital.  Patient does qualify for inpatient hospice that we will reassess if she is able to transfer with safety and comfort by tomorrow.  Code status:   Code Status: Do not attempt resuscitation (DNR) - Comfort care   Disposition: In-patient comfort care  Time spent for the meeting: 30 minutes  Case management notified to coordinate care with [facility] [place] [facility] for possible admission if she remains comfortable next 24 hours.    Dorcas Carrow, MD  08/30/2023, 12:16 PM

## 2023-08-30 NOTE — Plan of Care (Signed)
  Problem: Health Behavior/Discharge Planning: Goal: Goals will be collaboratively established with patient/family Outcome: Progressing   Problem: Nutrition: Goal: Risk of aspiration will decrease Outcome: Progressing   Problem: Nutrition: Goal: Dietary intake will improve Outcome: Progressing   Problem: Clinical Measurements: Goal: Will remain free from infection Outcome: Progressing   Problem: Elimination: Goal: Will not experience complications related to bowel motility Outcome: Progressing   Problem: Safety: Goal: Ability to remain free from injury will improve Outcome: Progressing

## 2023-08-30 NOTE — TOC Progression Note (Signed)
Transition of Care [facility]) - Progression Note    Patient Details  Name: MELLIE DEMARZO MRN: [id number] Date of Birth: Aug 09, 1931  [facility] CM/SW Contact  Dellie Burns [place], [place] Phone Number: 08/30/2023, 1:58 PM  Clinical Narrative:   Per MD, pt's husband has agreed to comfort care and is requesting hospice home placement at [facility]). Referral made to Roxanne with [facility] who will review pt and update SW re determination.   Dellie Burns, MSW, LCSW [phone] (coverage)      Expected Discharge Plan: Skilled Nursing Facility Barriers to Discharge: Continued Medical Work up, English as a second language teacher  Expected Discharge Plan and Services     Post Acute Care Choice: Skilled Nursing Facility Living arrangements for the past 2 months: Independent Living Facility                                       Social Determinants of Health (SDOH) Interventions SDOH Screenings   Food Insecurity: Patient Unable To Answer (08/27/2023)  Housing: Patient Unable To Answer (08/27/2023)  Transportation Needs: Patient Unable To Answer (08/27/2023)  Utilities: Patient Unable To Answer (08/27/2023)  Social Connections: Unknown (08/27/2023)  Tobacco Use: Low Risk  (08/26/2023)    Readmission Risk Interventions     No data to display

## 2023-08-30 NOTE — Progress Notes (Signed)
[name] NOTE    [name] Page  [name]:[id number] DOB: 07-17-1931 DOA: 08/26/2023 PCP: Geoffry Paradise, [name] Page    Brief Narrative:  89 year old with history of DVT not on anticoagulation, hypertension, hypothyroidism, chronic pain medications, GERD who is living at an independent living apartment at [facility] presented with unwitnessed fall, aphasia and right-sided weakness. She has multiple falls over last year.  Patient also has painful conditions due to multiple myeloma.  Husband came home to find her on the floor.  Called EMS.  Patient was aphasic on arrival.  Blood pressure was 180/90.  A-fib on the monitor.  CT head with large acute left SDH with 5 mm left-to-right shift.  Not on anticoagulation.  Admitted with neurosurgery and neurology consultation.  Recommended conservative management.  Subjective:  Patient seen and examined.  Profoundly aphasic.  Looks quiet and comfortable today.  Anxious on interaction.  No family at the bedside.  Not sure how much she is eating.   Assessment & Plan:   Acute left subdural hematoma with midline shift, aphasia. Seen by neurosurgery and neurology.  Recommended against emergent surgical evacuation of hematoma by neurosurgery. Resume home blood pressure medications. Mobilize with PT OT.  Refer to SNF at [facility] before going back to independent living. Continue to attempt to work with PT OT.  Hypokalemia: Replaced and adequate.  Hypophosphatemia: Replaced.  Chronic medical issues Essential hypertension, blood pressure stable today.   Multiple myeloma, chronic pain syndrome, on oxycodone and Lyrica.  Continue. Hypothyroidism, on Synthroid.  Continue.  Goal of care: 1/17, discussed in detail with patient's husband at the bedside.  Patient with poor healthy status to start with, she had multiple falls and recently has remained very frail and poor appetite.  We discussed about attempting rehab at [facility] with palliative care follow-up.  We  should monitor for symptoms, appropriateness for rehab before discharging.  Started on Seroquel 12.5 mg twice daily.  Monitor oral intake.  When stable, will discharge to SNF with palliative care follow-up.  Try adequate pain medications for pain relief.   DVT prophylaxis: SCD's Start: 08/26/23 2018   Code Status: DNR with limited intervention Family Communication: None today.  Will try to reach patient's husband. Disposition Plan: Status is: Inpatient Remains inpatient appropriate because: Inpatient monitoring needed     Consultants:  Neurology Neurosurgery  Procedures:  None  Antimicrobials:  Macrobid, long-term medication     Objective: Vitals:   08/29/23 1958 08/29/23 2338 08/30/23 0357 08/30/23 1020  BP: (!) 155/67 (!) 140/64 (!) 146/61 (!) 127/52  Pulse: 91 85 81   Resp: 18 18 18 18   Temp: 98.4 F (36.9 C) 97.7 F (36.5 C) 98.4 F (36.9 C) 97.7 F (36.5 C)  TempSrc: Oral Oral Oral Oral  SpO2: 98% 100% 99% 100%  Weight:      Height:        Intake/Output Summary (Last 24 hours) at 08/30/2023 1043 Last data filed at 08/30/2023 1000 Gross per 24 hour  Intake 195 ml  Output 300 ml  Net -105 ml   Filed Weights   08/26/23 1700 08/26/23 2035  Weight: 50 kg 50 kg    Examination:  General exam: Alert and awake.  Unable to talk.  Unable to follow commands, looks confused. Multiple ecchymotic areas on the shoulder and neck. Respiratory system: No added sounds. Cardiovascular system: S1 & S2 heard, RRR.  Gastrointestinal system: Soft.  Nontender.  Bowel sound present. Central nervous system: Alert and awake.  Not oriented.  Extremities: No swelling or edema. Right side is weaker than left side.    Data Reviewed: I have personally reviewed following labs and imaging studies  CBC: Recent Labs  Lab 08/26/23 1743 08/26/23 1752 08/28/23 0721 08/30/23 0559  WBC 7.1  --  9.0 8.7  NEUTROABS 5.3  --  7.5 6.7  HGB 8.3* 9.2* 8.4* 7.9*  HCT 25.1* 27.0*  23.8* 23.0*  MCV 94.0  --  89.1 91.6  PLT 296  --  342 325   Basic Metabolic Panel: Recent Labs  Lab 08/26/23 1743 08/26/23 1752 08/28/23 0721 08/29/23 1159 08/30/23 0559  NA 130* 130* 128*  --  132*  K 2.8* 2.8* 2.8*  --  3.5  CL 99 100 99  --  104  CO2 18*  --  18*  --  15*  GLUCOSE 156* 151* 115*  --  111*  BUN 11 12 10   --  20  CREATININE 0.66 0.50 0.57  --  0.70  CALCIUM 8.8*  --  8.7*  --  8.8*  MG  --   --  1.7  --  1.9  PHOS  --   --  2.0* 2.8  --    GFR: Estimated Creatinine Clearance: 31.9 mL/min (by C-G formula based on SCr of 0.7 mg/dL). Liver Function Tests: Recent Labs  Lab 08/26/23 1743 08/28/23 0721 08/30/23 0559  AST 29 39 29  ALT 12 16 20   ALKPHOS 294* 301* 226*  BILITOT 1.0 1.0 0.8  PROT 6.6 6.4* 6.5  ALBUMIN 3.4* 3.2* 3.0*   No results for input(s): "LIPASE", "AMYLASE" in the last 168 hours. No results for input(s): "AMMONIA" in the last 168 hours. Coagulation Profile: Recent Labs  Lab 08/26/23 1743  INR 1.1   Cardiac Enzymes: No results for input(s): "CKTOTAL", "CKMB", "CKMBINDEX", "TROPONINI" in the last 168 hours. BNP (last 3 results) No results for input(s): "PROBNP" in the last 8760 hours. HbA1C: No results for input(s): "HGBA1C" in the last 72 hours. CBG: Recent Labs  Lab 08/26/23 1746  GLUCAP 145*   Lipid Profile: No results for input(s): "CHOL", "HDL", "LDLCALC", "TRIG", "CHOLHDL", "LDLDIRECT" in the last 72 hours. Thyroid Function Tests: No results for input(s): "TSH", "T4TOTAL", "FREET4", "T3FREE", "THYROIDAB" in the last 72 hours. Anemia Panel: No results for input(s): "VITAMINB12", "FOLATE", "FERRITIN", "TIBC", "IRON", "RETICCTPCT" in the last 72 hours. Sepsis Labs: No results for input(s): "PROCALCITON", "LATICACIDVEN" in the last 168 hours.  No results found for this or any previous visit (from the past 240 hours).       Radiology Studies: No results found.       Scheduled Meds:   stroke: early stages  of recovery book   Does not apply Once   acyclovir  400 mg Oral BID   amLODipine  10 mg Oral Daily   gemfibrozil  600 mg Oral BID AC   levothyroxine  50 mcg Oral Q0600   losartan  50 mg Oral Daily   nitrofurantoin  100 mg Oral Daily   pantoprazole  40 mg Oral QHS   pregabalin  25 mg Oral BID   QUEtiapine  12.5 mg Oral BID   senna-docusate  1 tablet Oral BID   Continuous Infusions:     LOS: 4 days    Time spent: 40 minutes    Dorcas Carrow, [name] Page [facility]

## 2023-08-31 ENCOUNTER — Telehealth: Payer: Self-pay | Admitting: *Deleted

## 2023-08-31 DIAGNOSIS — S065XAA Traumatic subdural hemorrhage with loss of consciousness status unknown, initial encounter: Secondary | ICD-10-CM | POA: Diagnosis not present

## 2023-08-31 MED ORDER — MORPHINE SULFATE (CONCENTRATE) 10 MG /0.5 ML PO SOLN
10.0000 mg | ORAL | Status: DC
  Administered 2023-08-31 (×2): 10 mg via ORAL
  Filled 2023-08-31 (×2): qty 0.5

## 2023-08-31 NOTE — Discharge Summary (Signed)
Physician Discharge Summary  [name] Page ZHY:[id number] DOB: 1930/08/27 DOA: 08/26/2023  PCP: Geoffry Paradise, MD  Admit date: 08/26/2023 Discharge date: 08/31/2023  Admitted From: Independent living apartment Disposition: Inpatient hospice  Recommendations for Outpatient Follow-up:  As per hospice provider  Discharge Condition: Critical CODE STATUS: Comfort care Diet recommendation: Regular diet as tolerated, aspiration precautions  Discharge summary: 89 year old with history of DVT not on anticoagulation, hypertension, hypothyroidism, chronic pain medications, GERD who is living at an independent living apartment at [facility] presented with unwitnessed fall, aphasia and right-sided weakness. She has multiple falls over last year.  Patient also has painful conditions due to multiple myeloma.  Husband came home to find her on the floor.  Called EMS.  Patient was aphasic on arrival.  Blood pressure was 180/90.  A-fib on the monitor.  CT head with large acute left SDH with 5 mm left-to-right shift.  Not on anticoagulation.  Admitted with neurosurgery and neurology consultation.  Recommended conservative management. Patient continued to do poorly.  Painful, unable to participate in therapies and unable to eat. 1/19, converted to comfort care and hospice.   Assessment & Plan:   Acute traumatic left subdural hematoma with midline shift, profound aphasia. Failure to thrive. Multiple myeloma, acute on chronic pain. Unable to participate in therapies. End-of-life care.     Plan of care: Goal of care discussions, see 1/19.  Patient with discomfort, painful condition, unable to talk and unable to eat.  Desired comfort care and hospice as she was not a candidate for rehab. Patient is on chronic pain medications, will start patient on scheduled morphine 10 mg every 4 hours today. She is fairly stable to transfer to inpatient hospice if they have bed available.  We will continue to  provide end-of-life care when she is in the hospital. Patient is not safely able to take thyroxine and blood pressure medications.  Will discontinue. Patient will medicated for comfort before transporting. Further medication management as per inpatient hospice team at [facility] [place].    Discharge Diagnoses:  Principal Problem:   SDH (subdural hematoma) (HCC) Active Problems:   Essential hypertension   Multiple myeloma [facility])    Discharge Instructions  Discharge Instructions     Diet general   Complete by: As directed       Allergies as of 08/31/2023   No Known Allergies      Medication List     STOP taking these medications    acyclovir 400 MG tablet Commonly known as: ZOVIRAX   amLODipine 10 MG tablet Commonly known as: NORVASC   b complex vitamins capsule   furosemide 20 MG tablet Commonly known as: LASIX   gemfibrozil 600 MG tablet Commonly known as: LOPID   levothyroxine 50 MCG tablet Commonly known as: SYNTHROID   levothyroxine 75 MCG tablet Commonly known as: SYNTHROID   LORazepam 1 MG tablet Commonly known as: ATIVAN   losartan 50 MG tablet Commonly known as: COZAAR   nitrofurantoin 100 MG capsule Commonly known as: MACRODANTIN   oxyCODONE 5 MG immediate release tablet Commonly known as: Oxy IR/ROXICODONE   pantoprazole 20 MG tablet Commonly known as: PROTONIX   pregabalin 25 MG capsule Commonly known as: LYRICA        No Known Allergies  Consultations: Neurosurgery   Procedures/Studies: DG Pelvis Portable Result Date: 08/26/2023 CLINICAL DATA:  fall EXAM: PORTABLE PELVIS 1-2 VIEWS COMPARISON:  CT right hip 07/31/2023 FINDINGS: Limited evaluation due to overlapping osseous structures and overlying soft tissues.  Total bilateral hip arthroplasty. Kyphoplasty of the visualized lower lumbar spine. Redemonstration of cortical irregularity along the right superior pubic rami that correlates with question of a than acute now  subacute fracture noted on CT right hip 07/31/2023. Question irregularity of the inferior right superior pubic ram no pelvic diastasis. No pelvic bone lesions are seen. Stool within the rectum. IMPRESSION: 1. Recommend CT. 2. Redemonstration of cortical irregularity along the right superior pubic rami that correlates with question of a than acute now subacute fracture noted on CT right hip 07/31/2023. Superimposed acute component not excluded. 3. Question irregularity of the inferior right superior pubic rami- limited evaluation due to overlapping osseous structures and overlying soft tissues. 4. Total bilateral hip arthroplasty. 5. Kyphoplasty of the visualized lower lumbar spine. Electronically Signed   By: Tish Frederickson M.D.   On: 08/26/2023 19:38   CT Cervical Spine Wo Contrast Result Date: 08/26/2023 CLINICAL DATA:  Neck trauma (Age >= 65y) EXAM: CT CERVICAL SPINE WITHOUT CONTRAST TECHNIQUE: Multidetector CT imaging of the cervical spine was performed without intravenous contrast. Multiplanar CT image reconstructions were also generated. RADIATION DOSE REDUCTION: This exam was performed according to the departmental dose-optimization program which includes automated exposure control, adjustment of the mA and/or kV according to patient size and/or use of iterative reconstruction technique. COMPARISON:  CT cervical spine 07/31/2023 FINDINGS: Alignment: Grade 1 anterolisthesis of C2 on C3. Mild retrolisthesis C3 on C4 mild retrolisthesis of C5 on C6. Skull base and vertebrae: Multilevel severe degenerative change of the spine with associated severe osseous neural foraminal stenosis at the bilateral C3-C4 and right C4-C5 levels. No acute fracture. No aggressive appearing focal osseous lesion or focal pathologic process. Soft tissues and spinal canal: No prevertebral fluid or swelling. No visible canal hematoma. Upper chest: Unremarkable. Other: Atherosclerotic calcifications are present within the cavernous  internal carotid and vertebral arteries. IMPRESSION: 1. No acute displaced fracture or traumatic listhesis of the cervical spine. 2. Multilevel severe degenerative change of the spine with associated severe osseous neural foraminal stenosis at the bilateral C3-C4 and right C4-C5 levels. Electronically Signed   By: Tish Frederickson M.D.   On: 08/26/2023 19:34   DG Chest Portable 1 View Result Date: 08/26/2023 CLINICAL DATA:  Fall. EXAM: PORTABLE CHEST 1 VIEW COMPARISON:  July 23, 2021. FINDINGS: Stable cardiomediastinal silhouette. Status post left shoulder arthroplasty. Hypoinflation of the lungs with minimal bibasilar subsegmental atelectasis. Status post kyphoplasty at multiple levels of the thoracic spine. IMPRESSION: Hypoinflation of the lungs with minimal bibasilar subsegmental atelectasis. Electronically Signed   By: Lupita Raider M.D.   On: 08/26/2023 19:15   CT HEAD CODE STROKE WO CONTRAST Result Date: 08/26/2023 CLINICAL DATA:  Code stroke.  Aphasia, right-sided deficit EXAM: CT HEAD WITHOUT CONTRAST TECHNIQUE: Contiguous axial images were obtained from the base of the skull through the vertex without intravenous contrast. RADIATION DOSE REDUCTION: This exam was performed according to the departmental dose-optimization program which includes automated exposure control, adjustment of the mA and/or kV according to patient size and/or use of iterative reconstruction technique. COMPARISON:  07/31/2023 FINDINGS: Brain: Large left cerebral convexity subdural hematoma, which is mixed density, favored to be acute and, which measures up to 2.8 cm (series 6, image 15), with significant mass effect on the underlying left cerebral hemisphere, particularly the left parietal lobe. 5 mm of left-to-right midline shift. Additional subarachnoid hemorrhage in the right temporal sulci (series 6, image 21). No evidence of acute infarct, mass, hydrocephalus, or intraventricular hemorrhage. Vascular: No hyperdense  vessel. Skull: Negative for fracture or focal lesion. Sinuses/Orbits: No acute finding. Other: The mastoid air cells are well aerated. IMPRESSION: 1. Large left cerebral convexity subdural hematoma, which is mixed density, favored to be acute, which measures up to 2.8 cm, with mass effect on the underlying left cerebral hemisphere, particularly the left parietal lobe, and 5 mm of left-to-right midline shift. 2. Additional subarachnoid hemorrhage in the right temporal sulci. Imaging results were communicated on 08/26/2023 at 6:00 pm to provider Dr. Amada Jupiter via secure text paging. Electronically Signed   By: Wiliam Ke M.D.   On: 08/26/2023 18:02   (Echo, Carotid, EGD, Colonoscopy, ERCP)    Subjective: Patient seen in the morning rounds.  Looked uncomfortable before giving a dose of morphine.  No other overnight events.  Unable to participate.   Discharge Exam: Vitals:   08/30/23 1020 08/30/23 1950  BP: (!) 127/52 (!) 132/55  Pulse:  79  Resp: 18 16  Temp: 97.7 F (36.5 C) 97.9 F (36.6 C)  SpO2: 100% 98%   Vitals:   08/29/23 2338 08/30/23 0357 08/30/23 1020 08/30/23 1950  BP: (!) 140/64 (!) 146/61 (!) 127/52 (!) 132/55  Pulse: 85 81  79  Resp: 18 18 18 16   Temp: 97.7 F (36.5 C) 98.4 F (36.9 C) 97.7 F (36.5 C) 97.9 F (36.6 C)  TempSrc: Oral Oral Oral Oral  SpO2: 100% 99% 100% 98%  Weight:      Height:        General: Anxious.  Frail and debilitated.  Able to follow simple commands and able to use left hand. Multiple ecchymosis and bruises over the neck and arms. Cardiovascular: S1-S2 normal. Respiratory: Bilateral clear. Gastrointestinal: Soft.  Nontender.  Foley catheter with clear urine. Neuro: Alert, anxious, profoundly aphasic and unable to talk.  Right side is weaker than left side.    The results of significant diagnostics from this hospitalization (including imaging, microbiology, ancillary and laboratory) are listed below for reference.      Microbiology: No results found for this or any previous visit (from the past 240 hours).   Labs: BNP (last 3 results) No results for input(s): "BNP" in the last 8760 hours. Basic Metabolic Panel: Recent Labs  Lab 08/26/23 1743 08/26/23 1752 08/28/23 0721 08/29/23 1159 08/30/23 0559  NA 130* 130* 128*  --  132*  K 2.8* 2.8* 2.8*  --  3.5  CL 99 100 99  --  104  CO2 18*  --  18*  --  15*  GLUCOSE 156* 151* 115*  --  111*  BUN 11 12 10   --  20  CREATININE 0.66 0.50 0.57  --  0.70  CALCIUM 8.8*  --  8.7*  --  8.8*  MG  --   --  1.7  --  1.9  PHOS  --   --  2.0* 2.8  --    Liver Function Tests: Recent Labs  Lab 08/26/23 1743 08/28/23 0721 08/30/23 0559  AST 29 39 29  ALT 12 16 20   ALKPHOS 294* 301* 226*  BILITOT 1.0 1.0 0.8  PROT 6.6 6.4* 6.5  ALBUMIN 3.4* 3.2* 3.0*   No results for input(s): "LIPASE", "AMYLASE" in the last 168 hours. No results for input(s): "AMMONIA" in the last 168 hours. CBC: Recent Labs  Lab 08/26/23 1743 08/26/23 1752 08/28/23 0721 08/30/23 0559  WBC 7.1  --  9.0 8.7  NEUTROABS 5.3  --  7.5 6.7  HGB 8.3* 9.2* 8.4* 7.9*  HCT  25.1* 27.0* 23.8* 23.0*  MCV 94.0  --  89.1 91.6  PLT 296  --  342 325   Cardiac Enzymes: No results for input(s): "CKTOTAL", "CKMB", "CKMBINDEX", "TROPONINI" in the last 168 hours. BNP: Invalid input(s): "POCBNP" CBG: Recent Labs  Lab 08/26/23 1746  GLUCAP 145*   D-Dimer No results for input(s): "DDIMER" in the last 72 hours. Hgb A1c No results for input(s): "HGBA1C" in the last 72 hours. Lipid Profile No results for input(s): "CHOL", "HDL", "LDLCALC", "TRIG", "CHOLHDL", "LDLDIRECT" in the last 72 hours. Thyroid function studies No results for input(s): "TSH", "T4TOTAL", "T3FREE", "THYROIDAB" in the last 72 hours.  Invalid input(s): "FREET3" Anemia work up No results for input(s): "VITAMINB12", "FOLATE", "FERRITIN", "TIBC", "IRON", "RETICCTPCT" in the last 72 hours. Urinalysis    Component Value  Date/Time   COLORURINE YELLOW 05/25/2023 1038   APPEARANCEUR CLEAR 05/25/2023 1038   LABSPEC 1.010 05/25/2023 1038   PHURINE 6.0 05/25/2023 1038   GLUCOSEU NEGATIVE 05/25/2023 1038   HGBUR NEGATIVE 05/25/2023 1038   BILIRUBINUR NEGATIVE 05/25/2023 1038   KETONESUR NEGATIVE 05/25/2023 1038   PROTEINUR NEGATIVE 05/25/2023 1038   UROBILINOGEN 0.2 07/19/2010 1500   NITRITE NEGATIVE 05/25/2023 1038   LEUKOCYTESUR NEGATIVE 05/25/2023 1038   Sepsis Labs Recent Labs  Lab 08/26/23 1743 08/28/23 0721 08/30/23 0559  WBC 7.1 9.0 8.7   Microbiology No results found for this or any previous visit (from the past 240 hours).   Time coordinating discharge:  35 minutes  SIGNED:   Dorcas Carrow, MD  [facility] 08/31/2023, 1:18 PM

## 2023-08-31 NOTE — Progress Notes (Signed)
   1100am This pt was referred to hospice services for comfort care-- I have tried to reach the pt's spouse to discuss hospice services. No family at bedside. I have not been able to reach him to set up meeting at this time.  We will continue to reach out and update the hospital staff.   Norm Parcel RN [phone]

## 2023-08-31 NOTE — Progress Notes (Signed)
[facility] here for pt transport.  VSS prior to discharge.  Pt medicated with MS04 and ativan prior to discharge (see MAR).  No belongings at the bedside.

## 2023-08-31 NOTE — Telephone Encounter (Signed)
Received vm message from pt's husband, [name] Page. He states that [name] Page had a serious fall a few days ago and is currently admitted @ [address]. She sustained a SDH and is aphasic. He states that she will be getting comfort care at this time and Hospice services. He asked for her future appts to be cancelled.Marland Kitchen Appts cancelled, Dr. Leonides Schanz made aware.  TCT [name] Page. No answer but was able to leave vm message on his identified phone. Advised that Dr. Leonides Schanz will see [name] Page at [facility] either today or tomorrow

## 2023-08-31 NOTE — Progress Notes (Signed)
Report given to [facility], RN at [facility].  Plan for transport around 1600

## 2023-08-31 NOTE — TOC Transition Note (Signed)
Transition of Care [facility]) - Discharge Note   Patient Details  Name: [name] Page MRN: [id number] Date of Birth: October 31, 1930  Transition of Care [facility]) CM/SW Contact:  Baldemar Lenis, LCSW Phone Number: 08/31/2023, 2:23 PM   Clinical Narrative:   CSW updated by hospice liaison that patient is approved for hospice, spouse completing consent forms. CSW updated MD, patient stable for discharge. Transportation requested for 4:00 pm, arranged with [facility].  Nurse to call report to [phone].    Final next level of care: Hospice Medical Facility Barriers to Discharge: Barriers Resolved   Patient Goals and CMS Choice Patient states their goals for this hospitalization and ongoing recovery are:: patient unable to participate in goal setting, not fully oriented CMS Medicare.gov Compare Post Acute Care list provided to:: Patient Represenative (must comment) Choice offered to / list presented to : Spouse [place] ownership interest in [facility].provided to:: Spouse    Discharge Placement                Patient to be transferred to facility by: [facility] Name of family member notified: [name] Patient and family notified of of transfer: 08/31/23  Discharge Plan and Services Additional resources added to the After Visit Summary for       Post Acute Care Choice: Skilled Nursing Facility                               Social Drivers of Health (SDOH) Interventions SDOH Screenings   Food Insecurity: Patient Unable To Answer (08/27/2023)  Housing: Patient Unable To Answer (08/27/2023)  Transportation Needs: Patient Unable To Answer (08/27/2023)  Utilities: Patient Unable To Answer (08/27/2023)  Social Connections: Unknown (08/27/2023)  Tobacco Use: Low Risk  (08/26/2023)     Readmission Risk Interventions     No data to display

## 2023-08-31 NOTE — Progress Notes (Signed)
   08/31/23 1100  Spiritual Encounters  Type of Visit Initial  Care provided to: Patient  Conversation partners present during encounter Nurse  Referral source Patient request  Reason for visit Routine spiritual support  OnCall Visit No  Spiritual Framework  Presenting Themes Significant life change  Community/Connection Family  Patient Stress Factors Health changes   Chaplain responded to the consult. Pt was alone in the room. She was not able to communicate verbally with me. No family was in bedside. Chaplain attempted to say good words and wishes. Pt will discharge for comfort care for today. If family or patient needed, chaplain will be available.  Lindell Spar Chaplain Resident, BA [phone]

## 2023-08-31 NOTE — Progress Notes (Signed)
PROGRESS NOTE    [name] Page  YWV:[id number] DOB: 12/02/1930 DOA: 08/26/2023 PCP: Geoffry Paradise, MD    Brief Narrative:  89 year old with history of DVT not on anticoagulation, hypertension, hypothyroidism, chronic pain medications, GERD who is living at an independent living apartment at [facility] presented with unwitnessed fall, aphasia and right-sided weakness. She has multiple falls over last year.  Patient also has painful conditions due to multiple myeloma.  Husband came home to find her on the floor.  Called EMS.  Patient was aphasic on arrival.  Blood pressure was 180/90.  A-fib on the monitor.  CT head with large acute left SDH with 5 mm left-to-right shift.  Not on anticoagulation.  Admitted with neurosurgery and neurology consultation.  Recommended conservative management. Patient continued to do poorly.  Painful, unable to participate in therapies and unable to eat. 1/19, converted to comfort care and hospice.  Subjective:  Patient seen and examined.  On my exam, she looked uncomfortable.  I asked her whether she is hurting and she nodded her head and had some incomprehensible speech.  Looked anxious. Not getting adequate as needed medications, will start patient on scheduled morphine. If she gets a bed at hospice, will transfer today.   Assessment & Plan:   Acute left subdural hematoma with midline shift, profound aphasia. Failure to thrive. Multiple myeloma, acute on chronic pain. Unable to participate in therapies. End-of-life care.   Plan of care: Goal of care discussions, see 1/19.  Patient with discomfort, painful condition, unable to talk and unable to eat.  Desired comfort care and hospice as she was not a candidate for rehab. All symptom control medications are available.  Patient was not getting adequate medications as they were as needed. Patient is on chronic pain medications, will start patient on scheduled morphine 10 mg every 4 hours today. She  is fairly stable to transfer to inpatient hospice if they have bed available.  Husband prefers hospice of [facility]. We will continue to provide end-of-life care when she is in the hospital. RN to pronounce death. Patient is not safely able to take thyroxine and blood pressure medications.  Will discontinue.  DVT prophylaxis: Comfort care   Code Status: Comfort measures Family Communication: None today.   Disposition Plan: Status is: Inpatient Remains inpatient appropriate because: Providing end-of-life care.     Consultants:  Neurology Neurosurgery  Procedures:  None  Antimicrobials:  Discontinued.     Objective: Vitals:   08/29/23 2338 08/30/23 0357 08/30/23 1020 08/30/23 1950  BP: (!) 140/64 (!) 146/61 (!) 127/52 (!) 132/55  Pulse: 85 81  79  Resp: 18 18 18 16   Temp: 97.7 F (36.5 C) 98.4 F (36.9 C) 97.7 F (36.5 C) 97.9 F (36.6 C)  TempSrc: Oral Oral Oral Oral  SpO2: 100% 99% 100% 98%  Weight:      Height:        Intake/Output Summary (Last 24 hours) at 08/31/2023 1112 Last data filed at 08/31/2023 0900 Gross per 24 hour  Intake 260 ml  Output 1700 ml  Net -1440 ml   Filed Weights   08/26/23 1700 08/26/23 2035  Weight: 50 kg 50 kg    Examination:  General: Anxious.  Frail and debilitated.  Able to follow simple commands and able to use left hand. Multiple ecchymosis and bruises over the neck and arms. Cardiovascular: S1-S2 normal. Respiratory: Bilateral clear. Gastrointestinal: Soft.  Nontender.  Foley catheter with clear urine. Neuro: Alert, anxious, profoundly aphasic and unable  to talk.  Right side is weaker than left side.     Data Reviewed: I have personally reviewed following labs and imaging studies  CBC: Recent Labs  Lab 08/26/23 1743 08/26/23 1752 08/28/23 0721 08/30/23 0559  WBC 7.1  --  9.0 8.7  NEUTROABS 5.3  --  7.5 6.7  HGB 8.3* 9.2* 8.4* 7.9*  HCT 25.1* 27.0* 23.8* 23.0*  MCV 94.0  --  89.1 91.6  PLT 296  --  342  325   Basic Metabolic Panel: Recent Labs  Lab 08/26/23 1743 08/26/23 1752 08/28/23 0721 08/29/23 1159 08/30/23 0559  NA 130* 130* 128*  --  132*  K 2.8* 2.8* 2.8*  --  3.5  CL 99 100 99  --  104  CO2 18*  --  18*  --  15*  GLUCOSE 156* 151* 115*  --  111*  BUN 11 12 10   --  20  CREATININE 0.66 0.50 0.57  --  0.70  CALCIUM 8.8*  --  8.7*  --  8.8*  MG  --   --  1.7  --  1.9  PHOS  --   --  2.0* 2.8  --    GFR: Estimated Creatinine Clearance: 31.9 mL/min (by C-G formula based on SCr of 0.7 mg/dL). Liver Function Tests: Recent Labs  Lab 08/26/23 1743 08/28/23 0721 08/30/23 0559  AST 29 39 29  ALT 12 16 20   ALKPHOS 294* 301* 226*  BILITOT 1.0 1.0 0.8  PROT 6.6 6.4* 6.5  ALBUMIN 3.4* 3.2* 3.0*   No results for input(s): "LIPASE", "AMYLASE" in the last 168 hours. No results for input(s): "AMMONIA" in the last 168 hours. Coagulation Profile: Recent Labs  Lab 08/26/23 1743  INR 1.1   Cardiac Enzymes: No results for input(s): "CKTOTAL", "CKMB", "CKMBINDEX", "TROPONINI" in the last 168 hours. BNP (last 3 results) No results for input(s): "PROBNP" in the last 8760 hours. HbA1C: No results for input(s): "HGBA1C" in the last 72 hours. CBG: Recent Labs  Lab 08/26/23 1746  GLUCAP 145*   Lipid Profile: No results for input(s): "CHOL", "HDL", "LDLCALC", "TRIG", "CHOLHDL", "LDLDIRECT" in the last 72 hours. Thyroid Function Tests: No results for input(s): "TSH", "T4TOTAL", "FREET4", "T3FREE", "THYROIDAB" in the last 72 hours. Anemia Panel: No results for input(s): "VITAMINB12", "FOLATE", "FERRITIN", "TIBC", "IRON", "RETICCTPCT" in the last 72 hours. Sepsis Labs: No results for input(s): "PROCALCITON", "LATICACIDVEN" in the last 168 hours.  No results found for this or any previous visit (from the past 240 hours).       Radiology Studies: No results found.       Scheduled Meds:  morphine CONCENTRATE  10 mg Oral Q4H   Continuous Infusions:     LOS: 5  days    Time spent: 50 minutes    Dorcas Carrow, MD [facility]

## 2023-08-31 NOTE — Plan of Care (Signed)
Patient under comfort care. Needs and patient's comfortability addressed. No issues over the night.

## 2023-09-12 DEATH — deceased

## 2023-09-25 ENCOUNTER — Other Ambulatory Visit: Payer: Medicare PPO

## 2023-09-25 ENCOUNTER — Ambulatory Visit: Payer: Medicare PPO

## 2023-09-25 ENCOUNTER — Ambulatory Visit: Payer: Medicare PPO | Admitting: Hematology and Oncology

## 2023-10-28 IMAGING — RF DG LUMBAR SPINE 2-3V
1 series · 2 of 2 positions shown · non-contrast
Comparison: 06/08/2021

CLINICAL DATA: L3 and L5 compression deformities

EXAM:
LUMBAR SPINE - 2-3 VIEW

[Series 1: run · 2 of 2 slices shown]
[im 1/2]
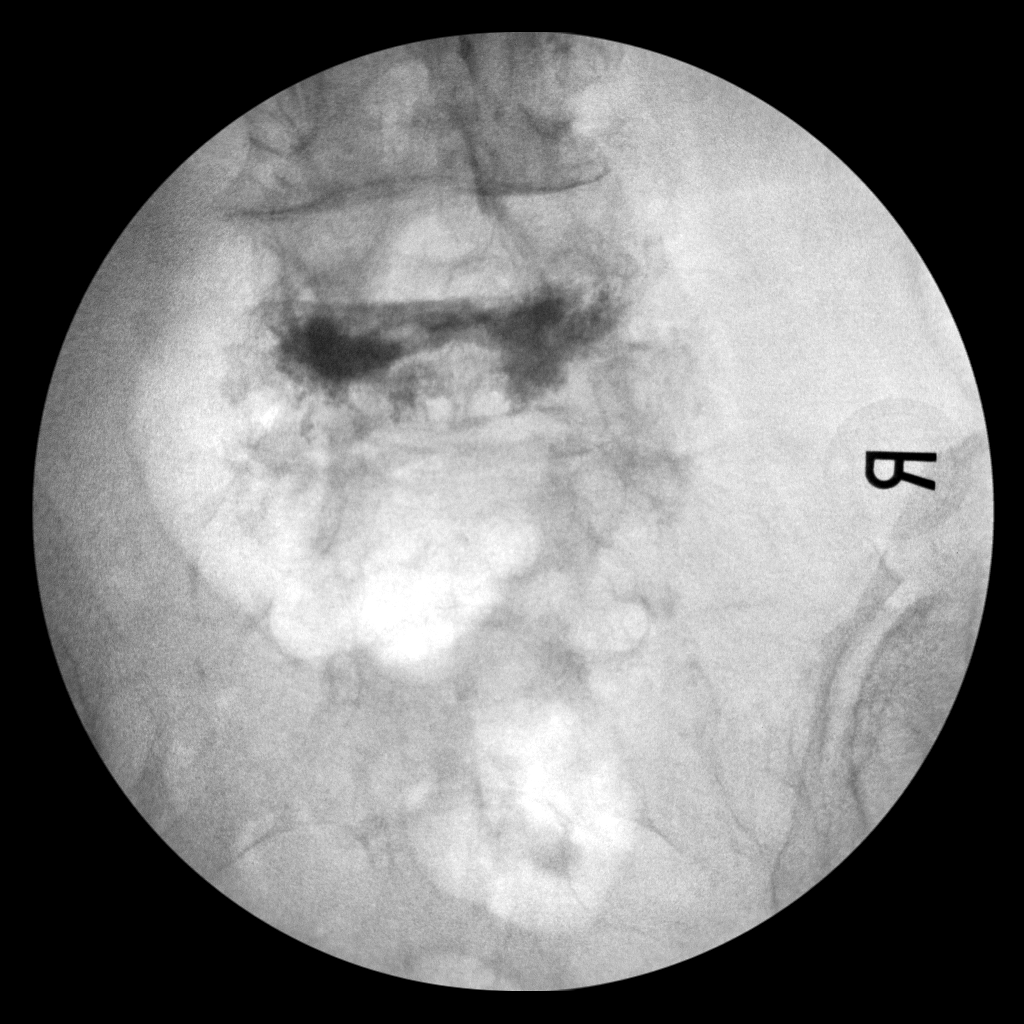
[im 2/2]
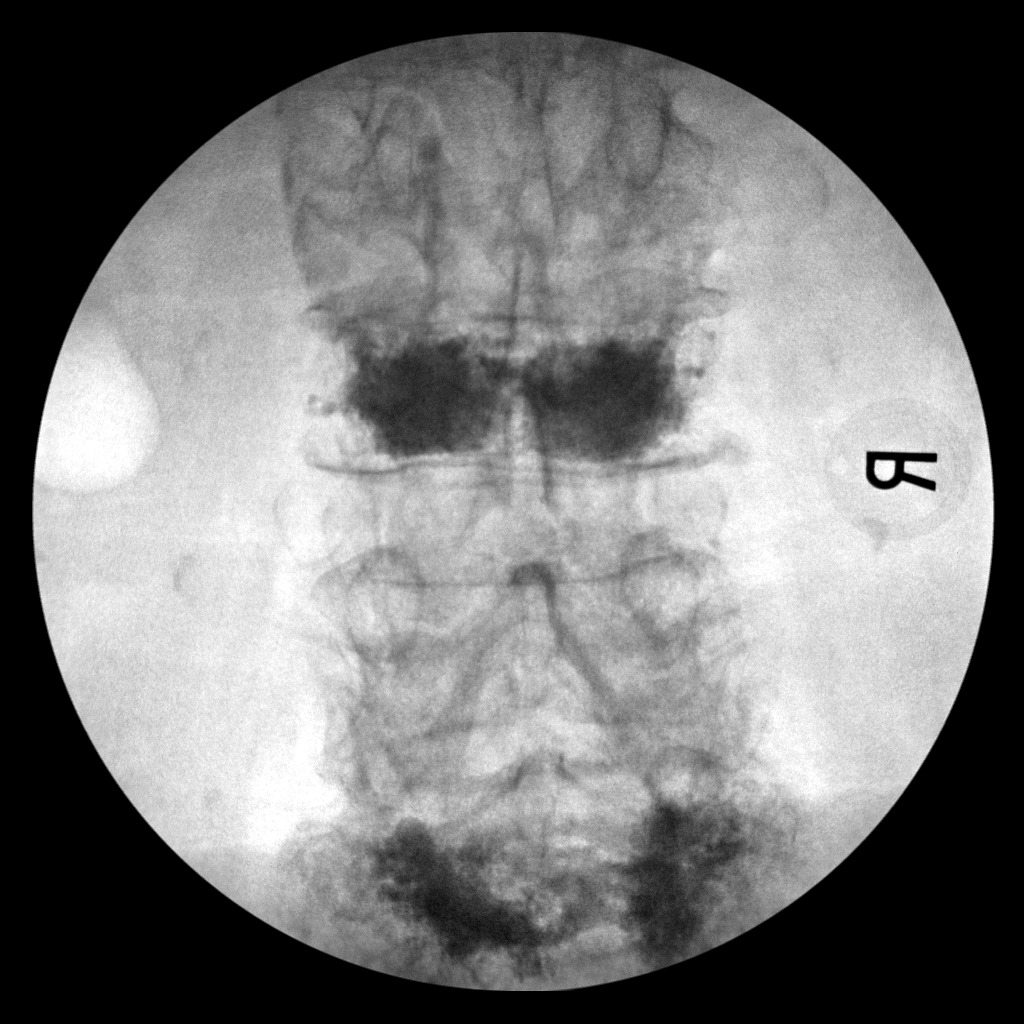

[2 of 2 positions shown; findings below may reference images not displayed]

FLUOROSCOPY TIME:  Radiation Exposure Index (as provided by the
fluoroscopic device): 104.04 mGy

If the device does not provide the exposure index:

Fluoroscopy Time:  2 minutes 45 seconds

Number of Acquired Images:  2
FINDINGS: Contrast laden cement is now seen at L3 and L5 consistent with the
interval kyphoplasty.
IMPRESSION: L3 and L5 kyphoplasty.

## 2024-01-05 IMAGING — RF DG THORACIC SPINE 2V
2 series · 2 of 2 positions shown · non-contrast
Comparison: MRI 08/15/2021

CLINICAL DATA: Midthoracic compression fractures status post
vertebral augmentation.

EXAM:
THORACIC SPINE 2 VIEWS

[Series 1: run · 1 of 1 slices shown (1 of 2)]
[im 1/1]
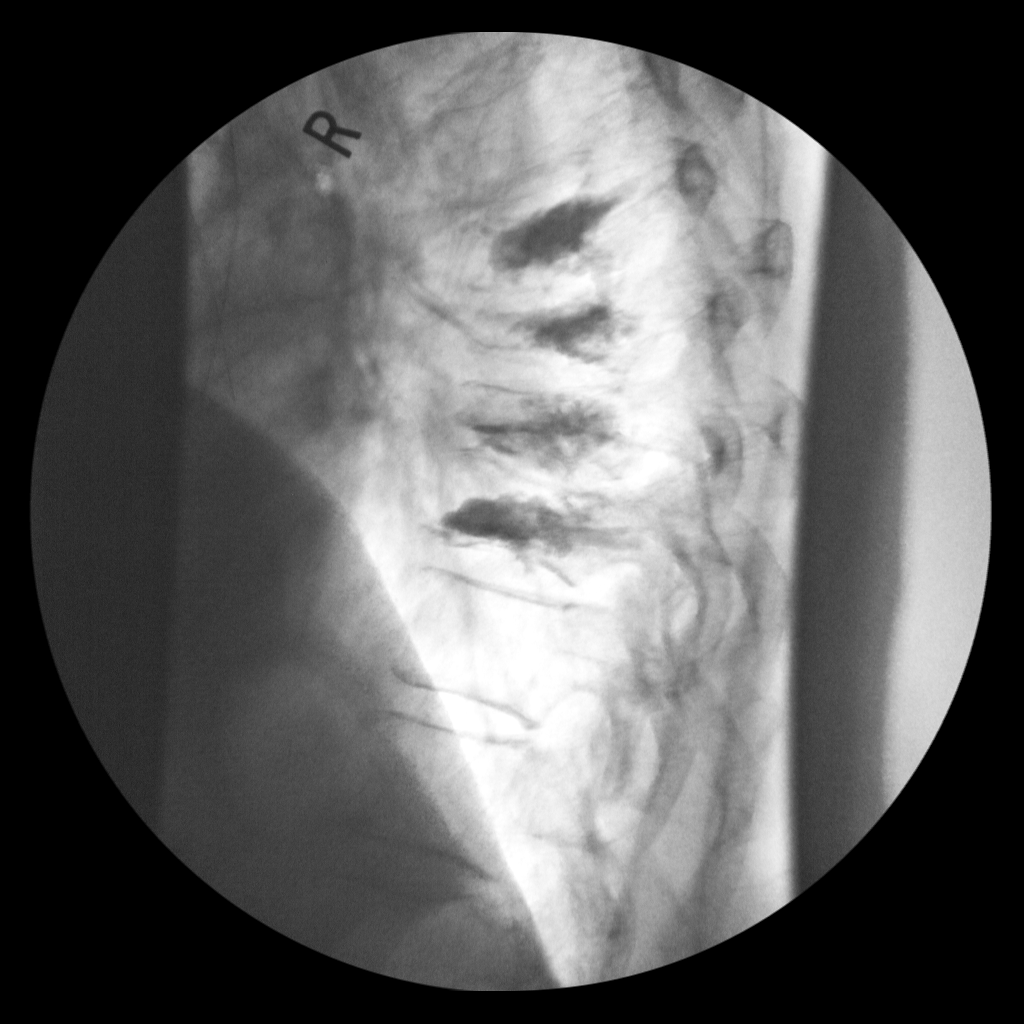

[Series 1: run · 1 of 1 slices shown (2 of 2)]
[im 1/1]
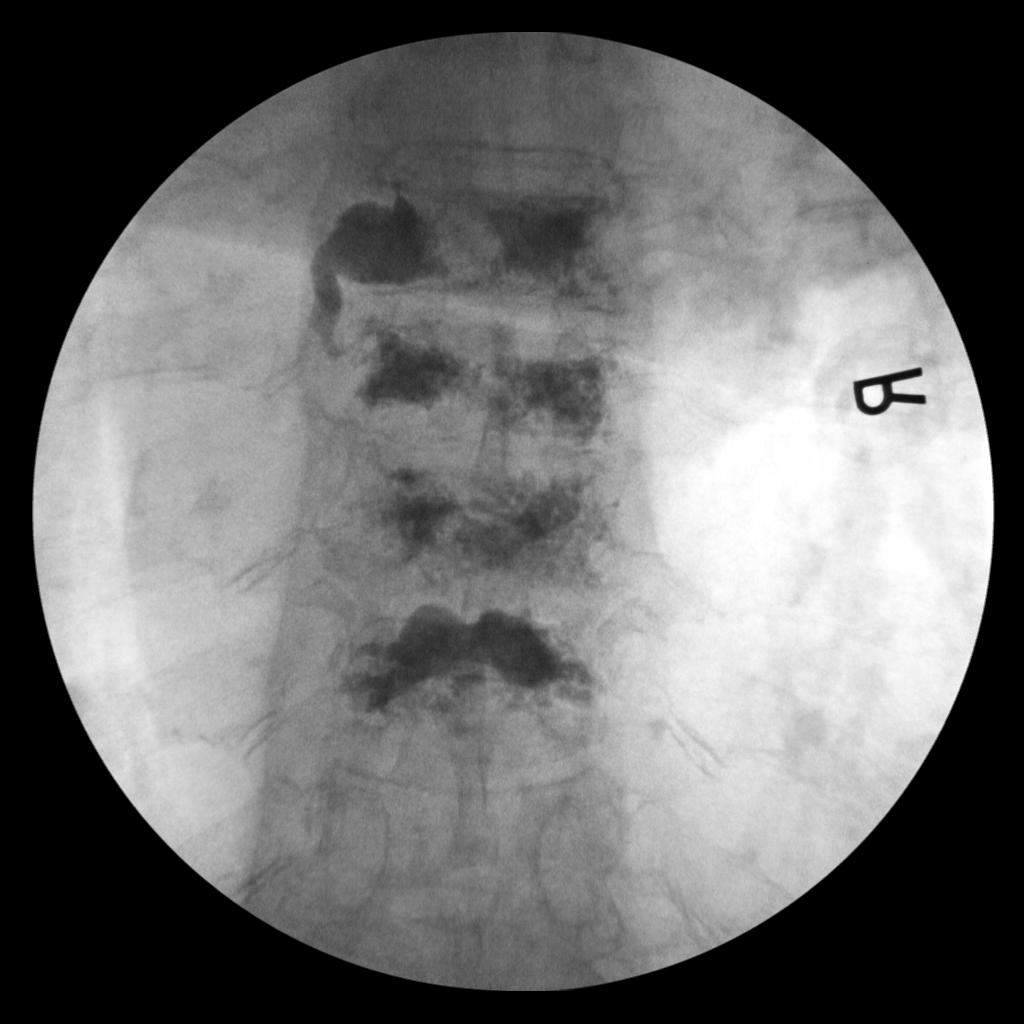

[2 of 2 positions shown; findings below may reference images not displayed]

FINDINGS: Fluoroscopic spot images demonstrate vertebral augmentation changes
at T7, T8, T9 and T10. Some bone cement is noted in a left
paravertebral vein at T7.
IMPRESSION: Vertebral augmentation changes at T7, T8, T9 and T10.
